# Patient Record
Sex: Female | Born: 1935 | Race: White | Hispanic: No | Smoking: Former smoker
Health system: Southern US, Community
[De-identification: ages and names within clinical notes are randomized; demographics above are authoritative.]

## PROBLEM LIST (undated history)

## (undated) DIAGNOSIS — J449 Chronic obstructive pulmonary disease, unspecified: Secondary | ICD-10-CM

## (undated) DIAGNOSIS — R221 Localized swelling, mass and lump, neck: Secondary | ICD-10-CM

## (undated) DIAGNOSIS — K254 Chronic or unspecified gastric ulcer with hemorrhage: Secondary | ICD-10-CM

## (undated) DIAGNOSIS — H269 Unspecified cataract: Secondary | ICD-10-CM

## (undated) DIAGNOSIS — K8020 Calculus of gallbladder without cholecystitis without obstruction: Secondary | ICD-10-CM

## (undated) DIAGNOSIS — E119 Type 2 diabetes mellitus without complications: Secondary | ICD-10-CM

## (undated) DIAGNOSIS — C44721 Squamous cell carcinoma of skin of unspecified lower limb, including hip: Secondary | ICD-10-CM

## (undated) DIAGNOSIS — R209 Unspecified disturbances of skin sensation: Secondary | ICD-10-CM

## (undated) DIAGNOSIS — S02640A Fracture of ramus of mandible, unspecified side, initial encounter for closed fracture: Secondary | ICD-10-CM

## (undated) DIAGNOSIS — K5090 Crohn's disease, unspecified, without complications: Secondary | ICD-10-CM

## (undated) DIAGNOSIS — E785 Hyperlipidemia, unspecified: Secondary | ICD-10-CM

## (undated) DIAGNOSIS — M5430 Sciatica, unspecified side: Secondary | ICD-10-CM

## (undated) DIAGNOSIS — K219 Gastro-esophageal reflux disease without esophagitis: Secondary | ICD-10-CM

## (undated) DIAGNOSIS — I872 Venous insufficiency (chronic) (peripheral): Secondary | ICD-10-CM

## (undated) DIAGNOSIS — R002 Palpitations: Secondary | ICD-10-CM

## (undated) DIAGNOSIS — G44209 Tension-type headache, unspecified, not intractable: Secondary | ICD-10-CM

## (undated) DIAGNOSIS — M1990 Unspecified osteoarthritis, unspecified site: Secondary | ICD-10-CM

## (undated) DIAGNOSIS — R0609 Other forms of dyspnea: Secondary | ICD-10-CM

## (undated) DIAGNOSIS — E041 Nontoxic single thyroid nodule: Secondary | ICD-10-CM

## (undated) DIAGNOSIS — G4700 Insomnia, unspecified: Secondary | ICD-10-CM

## (undated) DIAGNOSIS — N393 Stress incontinence (female) (male): Secondary | ICD-10-CM

## (undated) DIAGNOSIS — R5383 Other fatigue: Secondary | ICD-10-CM

## (undated) DIAGNOSIS — M25519 Pain in unspecified shoulder: Secondary | ICD-10-CM

## (undated) DIAGNOSIS — R609 Edema, unspecified: Secondary | ICD-10-CM

## (undated) DIAGNOSIS — R269 Unspecified abnormalities of gait and mobility: Secondary | ICD-10-CM

## (undated) DIAGNOSIS — D649 Anemia, unspecified: Secondary | ICD-10-CM

## (undated) DIAGNOSIS — J189 Pneumonia, unspecified organism: Secondary | ICD-10-CM

## (undated) DIAGNOSIS — M25569 Pain in unspecified knee: Secondary | ICD-10-CM

## (undated) DIAGNOSIS — I2510 Atherosclerotic heart disease of native coronary artery without angina pectoris: Secondary | ICD-10-CM

## (undated) DIAGNOSIS — L719 Rosacea, unspecified: Secondary | ICD-10-CM

## (undated) DIAGNOSIS — J479 Bronchiectasis, uncomplicated: Secondary | ICD-10-CM

## (undated) DIAGNOSIS — W010XXA Fall on same level from slipping, tripping and stumbling without subsequent striking against object, initial encounter: Secondary | ICD-10-CM

## (undated) DIAGNOSIS — J411 Mucopurulent chronic bronchitis: Secondary | ICD-10-CM

## (undated) DIAGNOSIS — I10 Essential (primary) hypertension: Secondary | ICD-10-CM

## (undated) DIAGNOSIS — R5381 Other malaise: Secondary | ICD-10-CM

## (undated) DIAGNOSIS — R000 Tachycardia, unspecified: Secondary | ICD-10-CM

## (undated) DIAGNOSIS — R0902 Hypoxemia: Secondary | ICD-10-CM

## (undated) HISTORY — DX: Squamous cell carcinoma of skin of unspecified lower limb, including hip: C44.721

## (undated) HISTORY — DX: Anemia, unspecified: D64.9

## (undated) HISTORY — DX: Unspecified cataract: H26.9

## (undated) HISTORY — DX: Edema, unspecified: R60.9

## (undated) HISTORY — DX: Type 2 diabetes mellitus without complications: E11.9

## (undated) HISTORY — DX: Pneumonia, unspecified organism: J18.9

## (undated) HISTORY — DX: Stress incontinence (female) (male): N39.3

## (undated) HISTORY — DX: Crohn's disease, unspecified, without complications: K50.90

## (undated) HISTORY — DX: Nontoxic single thyroid nodule: E04.1

## (undated) HISTORY — PX: SMALL INTESTINE SURGERY: SHX150

## (undated) HISTORY — DX: Rosacea, unspecified: L71.9

## (undated) HISTORY — DX: Gastro-esophageal reflux disease without esophagitis: K21.9

## (undated) HISTORY — DX: Chronic or unspecified gastric ulcer with hemorrhage: K25.4

## (undated) HISTORY — DX: Hyperlipidemia, unspecified: E78.5

## (undated) HISTORY — PX: JOINT REPLACEMENT: SHX530

## (undated) HISTORY — DX: Unspecified abnormalities of gait and mobility: R26.9

## (undated) HISTORY — DX: Other forms of dyspnea: R06.09

## (undated) HISTORY — DX: Insomnia, unspecified: G47.00

## (undated) HISTORY — DX: Pain in unspecified knee: M25.569

## (undated) HISTORY — DX: Tachycardia, unspecified: R00.0

## (undated) HISTORY — DX: Atherosclerotic heart disease of native coronary artery without angina pectoris: I25.10

## (undated) HISTORY — DX: Other fatigue: R53.83

## (undated) HISTORY — DX: Unspecified disturbances of skin sensation: R20.9

## (undated) HISTORY — DX: Hypoxemia: R09.02

## (undated) HISTORY — DX: Pain in unspecified shoulder: M25.519

## (undated) HISTORY — PX: TOTAL KNEE ARTHROPLASTY: SHX125

## (undated) HISTORY — DX: Unspecified osteoarthritis, unspecified site: M19.90

## (undated) HISTORY — DX: Calculus of gallbladder without cholecystitis without obstruction: K80.20

## (undated) HISTORY — DX: Bronchiectasis, uncomplicated: J47.9

## (undated) HISTORY — DX: Sciatica, unspecified side: M54.30

## (undated) HISTORY — DX: Tension-type headache, unspecified, not intractable: G44.209

## (undated) HISTORY — DX: Mucopurulent chronic bronchitis: J41.1

## (undated) HISTORY — DX: Localized swelling, mass and lump, neck: R22.1

## (undated) HISTORY — DX: Chronic obstructive pulmonary disease, unspecified: J44.9

## (undated) HISTORY — DX: Venous insufficiency (chronic) (peripheral): I87.2

## (undated) HISTORY — DX: Fracture of ramus of mandible, unspecified side, initial encounter for closed fracture: S02.640A

## (undated) HISTORY — DX: Other malaise: R53.81

## (undated) HISTORY — DX: Palpitations: R00.2

## (undated) HISTORY — DX: Fall on same level from slipping, tripping and stumbling without subsequent striking against object, initial encounter: W01.0XXA

## (undated) HISTORY — DX: Essential (primary) hypertension: I10

---

## 1988-02-04 DIAGNOSIS — H269 Unspecified cataract: Secondary | ICD-10-CM

## 1988-02-04 HISTORY — DX: Unspecified cataract: H26.9

## 1988-02-04 HISTORY — PX: EYE SURGERY: SHX253

## 1999-08-16 ENCOUNTER — Encounter: Admission: RE | Admit: 1999-08-16 | Discharge: 1999-08-16 | Payer: Self-pay | Admitting: Internal Medicine

## 1999-08-16 ENCOUNTER — Encounter: Payer: Self-pay | Admitting: Internal Medicine

## 2000-03-19 ENCOUNTER — Encounter: Admission: RE | Admit: 2000-03-19 | Discharge: 2000-06-17 | Payer: Self-pay | Admitting: Internal Medicine

## 2000-04-06 ENCOUNTER — Encounter (INDEPENDENT_AMBULATORY_CARE_PROVIDER_SITE_OTHER): Payer: Self-pay

## 2000-04-06 ENCOUNTER — Ambulatory Visit (HOSPITAL_COMMUNITY): Admission: RE | Admit: 2000-04-06 | Discharge: 2000-04-06 | Payer: Self-pay | Admitting: *Deleted

## 2000-08-17 ENCOUNTER — Encounter: Payer: Self-pay | Admitting: Internal Medicine

## 2000-08-17 ENCOUNTER — Encounter: Admission: RE | Admit: 2000-08-17 | Discharge: 2000-08-17 | Payer: Self-pay | Admitting: Internal Medicine

## 2001-08-11 ENCOUNTER — Encounter (HOSPITAL_BASED_OUTPATIENT_CLINIC_OR_DEPARTMENT_OTHER): Admission: RE | Admit: 2001-08-11 | Discharge: 2001-11-09 | Payer: Self-pay | Admitting: Internal Medicine

## 2001-09-07 ENCOUNTER — Encounter: Payer: Self-pay | Admitting: Internal Medicine

## 2001-09-07 ENCOUNTER — Encounter: Admission: RE | Admit: 2001-09-07 | Discharge: 2001-09-07 | Payer: Self-pay | Admitting: Internal Medicine

## 2002-02-18 ENCOUNTER — Encounter (HOSPITAL_BASED_OUTPATIENT_CLINIC_OR_DEPARTMENT_OTHER): Admission: RE | Admit: 2002-02-18 | Discharge: 2002-05-19 | Payer: Self-pay | Admitting: Internal Medicine

## 2002-05-20 ENCOUNTER — Encounter (HOSPITAL_BASED_OUTPATIENT_CLINIC_OR_DEPARTMENT_OTHER): Admission: RE | Admit: 2002-05-20 | Discharge: 2002-08-18 | Payer: Self-pay | Admitting: Internal Medicine

## 2002-09-12 ENCOUNTER — Encounter: Admission: RE | Admit: 2002-09-12 | Discharge: 2002-09-12 | Payer: Self-pay | Admitting: Internal Medicine

## 2002-09-12 ENCOUNTER — Encounter: Payer: Self-pay | Admitting: Internal Medicine

## 2003-03-10 ENCOUNTER — Ambulatory Visit (HOSPITAL_COMMUNITY): Admission: RE | Admit: 2003-03-10 | Discharge: 2003-03-10 | Payer: Self-pay | Admitting: Cardiology

## 2003-10-02 ENCOUNTER — Ambulatory Visit (HOSPITAL_COMMUNITY): Admission: RE | Admit: 2003-10-02 | Discharge: 2003-10-02 | Payer: Self-pay | Admitting: Internal Medicine

## 2003-10-12 ENCOUNTER — Ambulatory Visit (HOSPITAL_COMMUNITY): Admission: RE | Admit: 2003-10-12 | Discharge: 2003-10-12 | Payer: Self-pay | Admitting: Cardiology

## 2004-05-04 HISTORY — PX: GASTROJEJUNOSTOMY: SHX1697

## 2004-05-17 ENCOUNTER — Ambulatory Visit: Payer: Self-pay | Admitting: Internal Medicine

## 2004-05-17 ENCOUNTER — Inpatient Hospital Stay (HOSPITAL_COMMUNITY): Admission: EM | Admit: 2004-05-17 | Discharge: 2004-05-30 | Payer: Self-pay | Admitting: Emergency Medicine

## 2005-02-11 ENCOUNTER — Ambulatory Visit (HOSPITAL_COMMUNITY): Admission: RE | Admit: 2005-02-11 | Discharge: 2005-02-11 | Payer: Self-pay | Admitting: Internal Medicine

## 2006-02-03 DIAGNOSIS — K254 Chronic or unspecified gastric ulcer with hemorrhage: Secondary | ICD-10-CM

## 2006-02-03 HISTORY — PX: OTHER SURGICAL HISTORY: SHX169

## 2006-02-03 HISTORY — DX: Chronic or unspecified gastric ulcer with hemorrhage: K25.4

## 2006-04-13 ENCOUNTER — Ambulatory Visit (HOSPITAL_COMMUNITY): Admission: RE | Admit: 2006-04-13 | Discharge: 2006-04-13 | Payer: Self-pay | Admitting: Internal Medicine

## 2007-05-07 ENCOUNTER — Ambulatory Visit (HOSPITAL_COMMUNITY): Admission: RE | Admit: 2007-05-07 | Discharge: 2007-05-07 | Payer: Self-pay | Admitting: Internal Medicine

## 2008-02-04 DIAGNOSIS — M25569 Pain in unspecified knee: Secondary | ICD-10-CM

## 2008-02-04 HISTORY — DX: Pain in unspecified knee: M25.569

## 2008-06-06 ENCOUNTER — Ambulatory Visit (HOSPITAL_COMMUNITY): Admission: RE | Admit: 2008-06-06 | Discharge: 2008-06-06 | Payer: Self-pay | Admitting: Internal Medicine

## 2008-08-23 ENCOUNTER — Emergency Department (HOSPITAL_COMMUNITY): Admission: EM | Admit: 2008-08-23 | Discharge: 2008-08-23 | Payer: Self-pay | Admitting: Family Medicine

## 2008-09-13 ENCOUNTER — Encounter: Admission: RE | Admit: 2008-09-13 | Discharge: 2008-09-13 | Payer: Self-pay | Admitting: Emergency Medicine

## 2009-03-29 ENCOUNTER — Ambulatory Visit (HOSPITAL_COMMUNITY): Admission: RE | Admit: 2009-03-29 | Discharge: 2009-03-29 | Payer: Self-pay | Admitting: Orthopedic Surgery

## 2009-08-13 ENCOUNTER — Ambulatory Visit (HOSPITAL_COMMUNITY): Admission: RE | Admit: 2009-08-13 | Discharge: 2009-08-13 | Payer: Self-pay | Admitting: Internal Medicine

## 2009-10-16 ENCOUNTER — Encounter: Admission: RE | Admit: 2009-10-16 | Discharge: 2009-10-16 | Payer: Self-pay | Admitting: Internal Medicine

## 2009-10-17 ENCOUNTER — Encounter: Admission: RE | Admit: 2009-10-17 | Discharge: 2009-10-17 | Payer: Self-pay | Admitting: Internal Medicine

## 2010-02-24 ENCOUNTER — Encounter: Payer: Self-pay | Admitting: Internal Medicine

## 2010-06-14 ENCOUNTER — Encounter: Payer: Self-pay | Admitting: Family Medicine

## 2010-06-14 ENCOUNTER — Inpatient Hospital Stay (HOSPITAL_COMMUNITY)
Admission: AD | Admit: 2010-06-14 | Discharge: 2010-06-17 | DRG: 195 | Disposition: A | Discharge status: Nursing Home (Includes ICF) | Payer: Medicare Other | Source: Ambulatory Visit | Attending: Family Medicine | Admitting: Family Medicine

## 2010-06-14 DIAGNOSIS — I10 Essential (primary) hypertension: Secondary | ICD-10-CM | POA: Diagnosis present

## 2010-06-14 DIAGNOSIS — Z860100 Personal history of colon polyps, unspecified: Secondary | ICD-10-CM

## 2010-06-14 DIAGNOSIS — E785 Hyperlipidemia, unspecified: Secondary | ICD-10-CM | POA: Diagnosis present

## 2010-06-14 DIAGNOSIS — Z96659 Presence of unspecified artificial knee joint: Secondary | ICD-10-CM

## 2010-06-14 DIAGNOSIS — J159 Unspecified bacterial pneumonia: Secondary | ICD-10-CM

## 2010-06-14 DIAGNOSIS — E119 Type 2 diabetes mellitus without complications: Secondary | ICD-10-CM | POA: Diagnosis present

## 2010-06-14 DIAGNOSIS — G8929 Other chronic pain: Secondary | ICD-10-CM | POA: Diagnosis present

## 2010-06-14 DIAGNOSIS — J189 Pneumonia, unspecified organism: Principal | ICD-10-CM | POA: Diagnosis present

## 2010-06-14 DIAGNOSIS — M479 Spondylosis, unspecified: Secondary | ICD-10-CM | POA: Diagnosis present

## 2010-06-14 DIAGNOSIS — J40 Bronchitis, not specified as acute or chronic: Secondary | ICD-10-CM | POA: Diagnosis present

## 2010-06-14 DIAGNOSIS — K219 Gastro-esophageal reflux disease without esophagitis: Secondary | ICD-10-CM | POA: Diagnosis present

## 2010-06-14 DIAGNOSIS — Z9849 Cataract extraction status, unspecified eye: Secondary | ICD-10-CM

## 2010-06-14 LAB — CBC
HCT: 31.5 % — ABNORMAL LOW (ref 36.0–46.0)
Hemoglobin: 10.6 g/dL — ABNORMAL LOW (ref 12.0–15.0)
Platelets: 221 10*3/uL (ref 150–400)
RBC: 3.52 MIL/uL — ABNORMAL LOW (ref 3.87–5.11)

## 2010-06-14 LAB — GLUCOSE, CAPILLARY
Glucose-Capillary: 168 mg/dL — ABNORMAL HIGH (ref 70–99)
Glucose-Capillary: 177 mg/dL — ABNORMAL HIGH (ref 70–99)

## 2010-06-14 LAB — BASIC METABOLIC PANEL
BUN: 12 mg/dL (ref 6–23)
Chloride: 89 mEq/L — ABNORMAL LOW (ref 96–112)
GFR calc non Af Amer: >60 mL/min (ref >60)
Potassium: 3.9 mEq/L (ref 3.5–5.1)
Sodium: 128 mEq/L — ABNORMAL LOW (ref 135–145)

## 2010-06-16 LAB — CBC
Hemoglobin: 10.4 g/dL — ABNORMAL LOW (ref 12.0–15.0)
MCV: 88.7 fL (ref 78.0–100.0)
RBC: 3.45 MIL/uL — ABNORMAL LOW (ref 3.87–5.11)
WBC: 11.8 10*3/uL — ABNORMAL HIGH (ref 4.0–10.5)

## 2010-06-16 LAB — GLUCOSE, CAPILLARY: Glucose-Capillary: 173 mg/dL — ABNORMAL HIGH (ref 70–99)

## 2010-06-16 LAB — COMPREHENSIVE METABOLIC PANEL
ALT: 14 U/L (ref 0–35)
AST: 15 U/L (ref 0–37)
Albumin: 2.2 g/dL — ABNORMAL LOW (ref 3.5–5.2)
Alkaline Phosphatase: 95 U/L (ref 39–117)
BUN: 11 mg/dL (ref 6–23)
Calcium: 8.9 mg/dL (ref 8.4–10.5)
Creatinine, Ser: <0.47 mg/dL (ref 0.4–1.2)
Glucose, Bld: 146 mg/dL — ABNORMAL HIGH (ref 70–99)
Potassium: 3.7 mEq/L (ref 3.5–5.1)
Sodium: 131 mEq/L — ABNORMAL LOW (ref 135–145)
Total Protein: 6.2 g/dL (ref 6.0–8.3)

## 2010-06-17 DIAGNOSIS — J159 Unspecified bacterial pneumonia: Secondary | ICD-10-CM

## 2010-06-21 NOTE — Op Note (Signed)
NAMENOVELLE, ADDAIR               ACCOUNT NO.:  [id number]   MEDICAL RECORD NO.:  [id number]          PATIENT TYPE:  INP   LOCATION:  0155                         FACILITY:  [facility]   PHYSICIAN:  Vikki Ports, MDDATE OF BIRTH:  May 25, 1917   DATE OF PROCEDURE:  05/20/2004  DATE OF DISCHARGE:                                 OPERATIVE REPORT   PREOPERATIVE DIAGNOSIS:  Duodenal ulcer.   POSTOPERATIVE DIAGNOSIS:  Giant duodenal ulcer with active bleeding from the  gastroduodenal artery.   PROCEDURE:  Exploratory laparotomy, over-sewing of the duodenal stump,  pyloric excursion, and gastrojejunostomy.   SURGEON:  Vikki Ports, MD   ASSISTANT:  Thornton Park. Daphine Deutscher, M.D., Sandria Bales. Ezzard Standing, M.D.   DESCRIPTION OF PROCEDURE:  The patient was taken to the operating room and  placed in supine position after adequate general anesthesia was induced  using an endotracheal tube.  The abdomen was prepped and draped in a normal  sterile fashion.  Using an upper vertical midline incision, I dissected down  the fascia, and the fascia was opened.  The peritoneum was entered.  A very  large area of scar tissue was noticed on the second portion of the duodenum,  extending from the first portion to the second with adhesions to the  gallbladder.  The gallbladder had walled off an obvious perforation.  There  were no biliary contents and no evidence of free perforation.  The proximal  portion of the first portion of the duodenum was very thick and scarred, and  there was minimal serosa to sew to, and I opted to exclude the pylorus with  a TA 60 across the pylorus.  The duodenal stump proximal to the ampulla was  over-sewn with interrupted 2-0 silk sutures, and Tisseel was placed over the  repair.  Of note, there was a large vessel in the posterior wall of the  ulcer with a clot on it.  On manipulating this, we had active arterial  bleeding, which was controlled with 2-0 silk sutures.  The NG  tube was found  to be in good position, and a side-to-side gastrojejunostomy was performed  using the jejunum approximately 100 cm distal to the ligament of Treitz.  This was accomplished using a 75 mm blue-load GIA and the TA 60.  Comfortable with the duodenal stump repair,  the pyloric exclusion, and the gastrojejunostomy, I irrigated the abdomen  and opted to close.  Because of the patient's age, I opted not to do a  vagotomy.  The fascia was closed with running #1 Novofil.  The skin was  closed with staples.  The patient tolerated the procedure well and went to  PACU in good condition.      KRH/MEDQ  D:  05/20/2004  T:  05/20/2004  Job:  [id number]   cc:   Georgiana Spinner, M.D.  [address]  [place]  [place] [address]  Fax: [phone]

## 2010-06-21 NOTE — Cardiovascular Report (Signed)
NAME:  [name], [name]                         ACCOUNT NO.:  [id number]   MEDICAL RECORD NO.:  [id number]                   PATIENT TYPE:  OIB   LOCATION:  2854                                 FACILITY:  [facility]   PHYSICIAN:  Cristy Hilts. Jacinto Halim, M.D.                  DATE OF BIRTH:  24-Nov-1917   DATE OF PROCEDURE:  10/12/2003  DATE OF DISCHARGE:  10/12/2003                              CARDIAC CATHETERIZATION   PROCEDURE PERFORMED:  1.  Abdominal aortogram.  2.  Selective right and left renal arteriography.   INDICATION:  [name] is an 77 year old active female with history  of difficult to control hypertension in spite of her being on four  antihypertensive medications.  Given this, she was brought to the  catheterization lab to evaluate for renal artery stenosis and etiology for  uncontrolled hypertension.   HEMODYNAMIC DATA:  The opening aortic pressure of 162/63 with mean of 102  mmHg.   ANGIOGRAPHIC DATA:   ABDOMINAL AORTOGRAM:  Abdominal aortogram revealed presence of two renal  arteries; one on either side.  They appeared to be patent.   SELECTIVE RIGHT AND LEFT RENAL ARTERIOGRAPHY:  Selective right and left  arteriography revealed widely patent renal arteries.  The right renal artery  arises posteriorly and has no significant luminal irregularity.  It is  smooth and normal.   Left renal artery is also widely patent.   IMPRESSION:  Normal renal arteries with no evidence of renal artery stenosis  or atherosclerotic changes of the renal arteries.   RECOMMENDATIONS:  Based on the angiogram, will continue therapy for  essential hypertension is indicated.  Will add Catapres 0.1 mg p.o. b.i.d.  for the present regimen.  The patient will follow up as an outpatient.   TECHNIQUE OF PROCEDURE:  Under usual sterile precautions, using a 5 French  right femoral arterial access, a 5 French short Judkins right four  diagnostic catheter was utilized to perform the abdominal  aortogram.  Then,  the same catheter was utilized to engage the right and then the left renal  artery in a selective manner and then angiography was repeated.  Then, the  catheter was pulled out of the body in the usual fashion.  The patient  tolerated the procedure well.  No immediate complications noted.                                               Cristy Hilts. Jacinto Halim, M.D.    Pilar Plate  D:  10/12/2003  T:  10/13/2003  Job:  [id number]

## 2010-06-21 NOTE — Consult Note (Signed)
[facility]  Patient:    [name], [name] Visit Number: [id number] MRN: [id number]          Service Type: FTC Location: FOOT Attending Physician:  Sharren Bridge Dictated by:   Jonelle Sports Cheryll Cockayne, M.D. Proc. Date: 08/12/01 Admit Date:  08/11/2001   CC:         Truitt Merle, M.D.   Consultation Report  HISTORY:  This 77 year old white female comes referred by Dr. Farrel Conners for assistance with management of interdigital callus or corn on the left foot.  The patient apparently has been troubled by this lesion which is in the web of the interspace between the fourth and fifth toe of a left foot for a number of years.  There is striking bony protuberance at the proximal interphalangeal joint of the fifth toe and suspicion that there may have been prior trauma and partial dislocation of that toe to generate this.  She is unaware of any specific such trauma.  Nonetheless, with the compression of her forefoot by her shoes, this bony prominence is driven into the base of the web space and has generated chronic callus formation there.  Apparently from time to time this has been reduced by the podiatrist and she has been given a spacer to use between the fourth and fifth toe.  Nonetheless, it does recur and it has recurred recently and is currently uncomfortable to her.  When she mentioned this to Dr. Farrel Conners, Dr. Farrel Conners referred her here for our assistance.  PAST MEDICAL HISTORY:  Notable for type 2 diabetes, hypertension, hyperlipidemia, degenerative arthritis, venous insufficiency, diverticulosis, gastric ulcer disease, GERD.  She had had bilateral knee replacements.  ALLERGIES:  ADHESIVE TAPE, CODEINE.  REGULAR MEDICATIONS:  Glucophage XL, indapamide, Lipitor, aspirin, temazepam, Lotensin, potassium, Ocuvite, and several other OTC medications.  PHYSICAL EXAMINATION  EXTREMITIES:  Examination today is limited to the distal lower  extremities. The patients feet are without gross deformity, although there is the somewhat peculiar configuration of the left fifth toe as previously described.  There is no significant edema.  There is no evidence of severe chronic venous insufficiency, although she does have a history of mild problems in that regard.  Her skin temperatures are essentially normal and symmetrical.  All pulses are palpable and adequate.  Monofilament testing shows the preservation of protective sensation throughout both feet.  She has minor callus formation over the interphalangeal joint of the right hallux at the plantar first metatarsal head area.  In the interspace between the fourth and fifth toes of the left foot right at the base or web aspect of that interspace is a hardened calloused area without evidence of ulceration or secondary infection.  DISPOSITION: 1. The patient is given limited instruction regarding foot care and diabetes    by video instruction with nurse reinforcement. 2. The interdigital callus on the left foot is sharply pared without incident. 3. The calluses on the plantar aspect of the right foot are dremeled without    incident. 4. The patient is advised to continue use of her toe spacer and to insist on    adequate forefoot width in her foot wear. 5. She will return here on a p.r.n. basis. Dictated by:   Jonelle Sports Cheryll Cockayne, M.D. Attending Physician:  Sharren Bridge DD:  08/12/01 TD:  08/15/01 Job: [id number]

## 2010-06-21 NOTE — Discharge Summary (Signed)
[name], [name]               ACCOUNT NO.:  [id number]   MEDICAL RECORD NO.:  [id number]          PATIENT TYPE:  INP   LOCATION:  0376                         [facility]AC[facility]   PHYSICIAN:  Maxwell Caul, M.D.DATE OF BIRTH:  [birth year]   DATE OF ADMISSION:  05/17/2004  DATE OF DISCHARGE:                                 DISCHARGE SUMMARY   BRIEF HISTORY:  This is an 77 year old woman from [facility] in the  independent living section under the primary care of Dr. Chilton Si. She had been  seen recently with epigastric and back pain. She had been seen recently in  the clinic at [facility] with these problems. A urine culture on  May 09, 2004 had shown E. coli that was treated. She also had medication  alterations including discontinuing her Voltaren and Skelaxin.   Just before admission she was transported to the skilled unit of [facility]. She developed melanotic stools two times yesterday and three  times on the day of admission. On arrival to the emergency room, her  hemoglobin was 8.9; it was 11.5 the day before admission. She had an  endo/colon by Dr. Virginia Rochester 3 years ago which showed a gastric ulcer and  esophagitis. She had not been on a proton pump inhibitor but had recently  been on Voltaren and enteric-coated aspirin.   PAST MEDICAL HISTORY:  1.  Back pain with an MRI on March 20 showing multilevel spondylosis.  2.  Type 2 diabetes mellitus on oral agents.  3.  Hemorrhoids external and internal.  4.  Hypertension.  5.  Osteoarthritis.  6.  Venous insufficiency  7.  Hyperlipidemia.  8.  Gastroesophageal reflux disease.  9.  Colonoscopy by Dr. Virginia Rochester showing multiple polyps.  10. Arthralgias.   PAST SURGICAL HISTORY:  She had total knee replacement in 1993 and then on  the other side in 1998.   PAST PROCEDURES:  She had a Cardiolite in 2004 which showed inferior wall  ischemia, colonoscopy had polyp removal in the late 1990s. Had another  colonoscopy and endoscopy in 2002 which showed colonic polyps and endoscopy  showed ulcerations throughout the stomach, mostly in the antrum but some in  the body and the fundus. Esophagitis was also noted. At that time, pathology  showed erosions but no evidence of metaplasia or Helicobacter pylori were  identified. Esophageal biopsy changes were consistent with reflux.   MEDICATIONS ON ADMISSION:  1.  Glucophage XR 1000 b.i.d.  2.  Coreg 2.5 b.i.d.  3.  Lozol 1.25 daily.  4.  Aspirin 81 daily.  5.  Teveten 600 daily.  6.  Caduet 5/10 one p.o. daily.  7.  Tylenol Arthritis two p.o. q.8h.  8.  Tums two p.o. b.i.d.  9.  Clonidine 0.25 b.i.d.  10. She had been on Flagyl 500 q.8h. x10 days that started on April 13.  11. Was started on Cipro 250 p.o. b.i.d. for 10 days on the day of admission      to hospital.   REVIEW OF SYSTEMS:  CARDIOVASCULAR:  She denied any recent chest  pain or  exertional shortness of breath. GU:  She denied any dysuria or hematuria.   PHYSICAL EXAMINATION ON ADMISSION:  VITAL SIGNS:  Her blood pressure was in  the mid 90s systolic on arrival but had come up to 111 systolic by time I  had reviewed her with fluids and blood.  GENERAL:  She was pale but alert and talkative.  LYMPH:  None palpable.  RESPIRATORY:  Clear.  CARDIOVASCULAR:  Heart sounds were normal. There were no murmurs or gallops.  ABDOMEN:  Soft, nontender. No liver or spleen was noted.  RECTAL:  OB positive by the EDP who had seen her prior to our arrival.  BREAST:  Deferred.  EXTREMITIES:  Showed normal pulses without edema.   RADIOLOGY:  Chest x-ray showed no active lung disease.   LABORATORY AND OTHER INVESTIGATIONS:  Urine culture showed no growth. On  arrival, her sodium was 123, potassium 3.3, chloride 88, CO2 was 27, BUN 61,  creatinine 1.1, albumin 2.4. By April 15, her sodium was up to 127. By April  19, it had corrected to 138. Her BUN at that point was 13 and creatinine was  1.7,  albumin was 1.8. On April 25, her sodium was 135, potassium 3.3,  chloride 103, CO2 27, BUN was 3, and creatinine was 0.6. Her liver function  tests were consistently normal.   On arrival, her white count was 19.1, hemoglobin was 8.9. She was transfused  to a hemoglobin of 11.7. However, over the next 48 hours her hemoglobin  dropped to 8.2 with further obvious clinical bleeding. She was transfused  again. However, by April 19 her hemoglobin had dropped to 7.6. On April 25,  at discharge, her hemoglobin is 9.7, white count 7.2.   COURSE IN HOSPITAL:  This patient was admitted with findings consistent with  upper gastrointestinal bleeding source including black, tarry stools,  elevated BUN, and together with clinical history. She was kindly seen in  consultation by Dr. Juanda Chance for Dr. Virginia Rochester and underwent endoscopy. The  endoscopy showed a mild stricture in the distal esophagus. However, in the  duodenal bulb there was a large ulcer with clot black material. The ulcer  was washed. There was no active bleeding but there was stigmata of recent  bleeding. She was started on proton pump inhibitors intravenously.   Unfortunately, the patient did not stabilize. She required repeat  transfusions and required repeat endoscopy on April 17 by Dr. Virginia Rochester. Noted a  huge duodenal ulcer with large visible vessels. At that point in time,  general surgery was consulted who was Dr. Luan Pulling and her colleagues. The  patient ultimately went to the operating room on April 17 by Dr. Luan Pulling  and colleagues. She had the duodenal ulcer oversewn. The duodenal stump  proximal to the ampulla was oversewn. A large vessel in the posterior wall  of the ulcer was noted and with manipulation, this developed active bleeding  which was sutured. Ultimately, she had a side-to-side gastrojejunostomy  using the jejunum approximately 100 cm distal to the ligament of Treitz. A  vagotomy was not done.  The patient was then  transported to the ICU. She spent several days being  monitored in the intensive care unit but generally her progress was slow and  steady with improvement. She was eventually able to be sent to the floor and  she continued to stabilize. She had drains in that were eventually able to  be removed and had a protracted period of time with  an NG tube, and this was  also eventually able to be removed. Her hemoglobin stabilized in the mid to  upper 9 range.   Roughly 72 hours prior to her discharge she developed a rash in her lower  extremities. This had a vasculitic tone to it. She was kindly seen in  consultation by Dr. Earlene Plater of dermatology. He felt that this was most likely  leukocytoclastic vasculitis of uncertain etiology. He noted a biopsy could  be performed but unlikely to reveal the etiology. Topical steroids for  symptomatic relief was noted. At discharge she will be receiving topical  triamcinolone and if this does not fade, to see Dr. Earlene Plater in roughly 1-2  weeks or sooner if it worsens.   FINAL DIAGNOSES:  1.  Upper gastrointestinal bleed secondary to huge bleeding duodenal ulcer;      see discussion above.  2.  Hypovolemic shock secondary to #1.  3.  Multiple transfusions required secondary to #1; eventually requiring      duodenal oversew and gastrojejunostomy performed by Dr. Luan Pulling (see      discussion above).  4.  Type 2 diabetes mellitus. For most of this hospitalization she did not      require anything but sliding scale. I will start her back on a small      dose of Glucophage at discharge and monitor her CBGs b.i.d. on return to      [facility].  5.  Hypertension. Also, she has been off most of her previous medications      during the course of this hospitalization and postoperatively. At      discharge, she will have Coreg and her angiotensin receptor blocker      added back. It is possible as her diet and oral intake improves that she      will need  additional medication adjustments.  6.  Leukocytoclastic vasculitis. See discussion above. She feels better with      topical steroids. This will continue and perhaps if this does not      resolve in the same mysterious way that it arrived then she will need to      see Dr. Earlene Plater in follow-up in his office.  7.  Osteoarthritis. The patient has continued to complain of osteoarthritic      pain in her hips and also her back. I am hopeful that we can manage this      with Tylenol Extra Strength and p.r.n. painkillers. She should, of      course, never be on nonsteroidals or aspirin.   DISCHARGE MEDICATIONS:  1.  Coreg 6.25 b.i.d.  2.  Protonix 40 mg p.o. daily.  3.  Glucophage 500 p.o. b.i.d.  4.  Tylenol Extra Strength two p.o. b.i.d. routinely.  5.  Ultram 50 mg p.o. q.6h. p.r.n.  6.  Triamcinolone 0.1% topically b.i.d. lightly to rash on lower extremities      x1 week.  7.  Teveten 600 mg p.o. daily.  OTHER DISCHARGE INSTRUCTIONS:  CBGs to be done b.i.d. It is not anticipated  at this point that she will need a sliding scale, although this may need to  be reinstituted. Blood pressure will need to be checked daily and as her  intake improves, additional blood pressure medications may need to be added  or current ones adjusted. She will need a CBC and basic metabolic panel on  Monday of next week - May 1. She will need follow-up with general surgery in  2 weeks' time in their office. If her rash is not resolved on her lower  extremities, she should follow up with Dr. Earlene Plater in 2 weeks' time. She can  either follow up with Dr. Chilton Si at the [facility] clinic or she can  follow up in the [facility] office and should be seen within 2 or  3 weeks' time. Her activity levels can  be p.r.n. Her diet should be regular, although it is anticipated that as  this improves that she may need to be changed to a no concentrated sweets or  ADA diet.   Overall, she is discharged in very  stable condition to the skilled unit of  [facility].      MGR/MEDQ  D:  05/30/2004  T:  05/30/2004  Job:  [id number]

## 2010-06-21 NOTE — Op Note (Signed)
[name], [name]               ACCOUNT NO.:  [id number]   MEDICAL RECORD NO.:  [id number]          PATIENT TYPE:  INP   LOCATION:  0155                         [facility]ACILITY[facility]   PHYSICIAN:  Georgiana Spinner, M.D.    DATE OF BIRTH:  30-Dec-1917   DATE OF PROCEDURE:  DATE OF DISCHARGE:                                 OPERATIVE REPORT   PROCEDURE:  Upper endoscopy.   ENDOSCOPIST:  Georgiana Spinner, M.D.   INDICATIONS:  Acute gastrointestinal bleeding.   ANESTHESIA:  Demerol 20, Versed 4 mg.   DESCRIPTION OF PROCEDURE:  With the patient mildly sedated in the left  lateral decubitus position, the Olympus videoscopic endoscope was inserted  in the mouth, passed under direct vision through the esophagus, which  appeared normal, into the stomach.  Fundus, body, and antrum was noted, and  all appeared normal.  There was a small amount of blood seen in the stomach,  dark blackish.  We entered into the duodenal bulb and just distal to the  bulb was a huge ulcer crater with blood clot on it.  We washed the clot off  and saw a large visible vessel that was not actively bleeding at this time.  I washed it on two occasions and elected that the best course of action  would be to do nothing at this point but to consider surgical treatment.  The endoscope was then pulled back into the stomach, placed in retroflexion  to view the stomach from below. The endoscope was straightened and withdrawn  taking circumferential views of the remaining gastric and esophageal mucosa.  The patient's vital signs and pulse oximeter remained stable.  The patient  tolerated the procedure well and without apparent complications.   FINDINGS:  Huge duodenal ulcer with large visible vessel.   PLAN:  The patient has bled twice now, and given the size of the vessel and  the ulcer, I think the best course of therapy is going to be surgical, and I  will contact surgery for a consultation.      GMO/MEDQ  D:  05/20/2004   T:  05/20/2004  Job:  [id number]   cc:   Maxwell Caul, M.D.  [address]  [place]  [place] [address]  Fax: [phone]

## 2010-06-21 NOTE — H&P (Signed)
[name], [name] NO.:  [id number]   MEDICAL RECORD NO.:  [id number]          PATIENT TYPE:  EMS   LOCATION:  ED                           FACILITY:  [facility]   PHYSICIAN:  Maxwell Caul, M.D.DATE OF BIRTH:  05-27-1917   DATE OF ADMISSION:  05/17/2004  DATE OF DISCHARGE:                                HISTORY & PHYSICAL   ID:  This is an 77 year old female from [facility]  under the primary care of Dr. Chilton [name].   CHIEF COMPLAINT:  Blood per rectum.   HISTORY:  This patient was seen in [facility] clinic on April 11 with  epigastric pain which had started on April 8. Her exam suggested tenderness  in the epigastric area and there was some suggestion of vomiting as well as  a foul odor to her urine noted. The Voltaren and Skelaxin that she was on  for back pain were discontinued. She had also been seen on March 21 with  diarrhea and blood when she wipes.  Noted to have heme positive stools at  that time. Urine culture showed E. coli on April 6 that was pan sensitive.   The patient tells me she vomited all day Tuesday without hematemesis and  then yesterday developed melanotic stools x2 and then x3 today. Her  hemoglobin was 8.9 on arrival in the emergency room, this was 11.5  yesterday. She has a history of having an endocolon by Dr. Virginia [name] approximately  three years ago which showed a gastric ulcer and esophagitis. She has not  been on a proton pump inhibitor but has recently been on Voltaren and  enteric coated aspirin.   PAST MEDICAL HISTORY:  1. Back pain with an MRI on March 20 showing multilevel spondylosis.  2. Type 2 diabetes mellitus on oral agents.  3. Hemorrhoids external and internal.  4. Hypertension.  5. Osteoarthritis.  6. Venous insufficiency.  7. Hyperlipidemia.  8. Gastroesophageal reflux disease.  9. Colonoscopy showing multiple polyps.  10.Arthralgia.     PAST SURGICAL HISTORY:  She had a bilateral  total knee replacement in 1993  and then in 1998.   PROCEDURE:  She had a Cardiolite in 2004 which showed inferior wall  ischemia, colonoscopy has shown polyp removal in the late 1990's.  Colonoscopy and endo by [name] in 2002 showed colonic polyps and an endo showed  ulcerations throughout the stomach mostly in the antrum but some in the body  and fundus. Esophagitis was also noted. At that time, pathology showed  erosions, no intestinal metaplasia or H. pylori were identified. Esophageal  biopsy change is consistent with reflux.   MEDICATIONS ON ADMISSION:  1. Glucophage XR 1000 b.i.d.  2. Coreg 2.5 b.i.d.  3. Lozol 1.25 q.d.  4. ASA 81 q.d.  5. Teveten 600 q.d.  6. Caduet 5/10, 1 p.o. q.d.  7. Tylenol arthritis 2 tabs q. 8h.  8. Tums 2 p.o. b.i.d.  9. Clonidine 0.25 b.i.d.  10.She has been on Flagyl 500 q.8 x10 days started on April 13 and was      started  Cipro 250 p.o. b.i.d. for 10 days today.     REVIEW OF SYMPTOMS:  CARDIOVASCULAR:  She denies chest pain. RESPIRATORY:  No shortness of breath. GU:  She states no current dysuria.   PHYSICAL EXAMINATION:  VITAL SIGNS:  Her blood pressure was in the 90's on  arrival now up to 111 systolic.  GENERAL:  She is a pale woman but alert and talkative.  LYMPHS:  None palpable.  RESPIRATORY:  Clear.  CARDIOVASCULAR:  Heart tones were normal. There are no murmurs, no gallops.  ABDOMEN:  Soft, nontender, no liver, no spleen.  RECTAL:  OB positive per the EDP.  BREASTS:  Deferred.  EXTREMITIES:  Showed normal pulses without edema.   LABORATORY DATA:  Laboratory work to date.  Her white count is 19.1 and was  20,000 yesterday. Her hemoglobin was 8.9 and was 11.5 yesterday. Platelet  count is normal at 379. Her PT and PTT are normal. Her comprehensive  metabolic panel shows a sodium of 123, potassium of 3.3, chloride 88, total  CO2 of 27, BUN of 61, creatinine 1.1, albumin is 2.4. Her liver function  tests are normal.    IMPRESSION:  1. Upper gastrointestinal bleed. I think this is likely NSAID induced      gastropathy. The patient has a history of peptic ulcer disease as it      was H. pylori negative as well as esophagitis. Her blood pressure was      down on arrival; however, she has responded now to transfusion and      fluids. I think she is stable enough to go to the floor. She will be      started on Protonix IV.  2. History of gastroesophageal reflux disease with esophagitis.  3. History of peptic ulcer disease in 2002.  4. Elevated white count. She has a urinary tract infection which was E.      coli and pan sensitive but no obvious clinical signs or symptoms. She      has been on antibiotics, however, and I will continue IV antibiotics      for presumed pyelonephritis. This also could demargination secondary to      stress as well. Will also check a chest x-ray.  5. Hyponatremia, cause is not completely certain; however, this is      occuring in the setting of volume deficiency and I will replace with      isotonic fluid.  6. Elevated BUN likely secondary to upper gastrointestinal bleeding      source.  7. Type 2 diabetes mellitus. She took her medications this morning and      therefore we will need to add D5 to her IV fluids.     PLAN:  She will receive 2 units of blood transfusion and her H&H will be  monitored overnight. She will need volume expansion. Proton pump inhibitor  IV was then started. Empiric Cipro for the possibility of continued UTI.  GI  consultant is going to be called.      MGR/MEDQ  D:  05/17/2004  T:  05/17/2004  Job:  [id number]

## 2010-06-21 NOTE — Procedures (Signed)
[facility]  Patient:    [name], [name]                        MRN: [id number] Proc. Date: 04/06/00 Adm. Date:  [id number] Attending:  Sabino Gasser                           Procedure Report  PROCEDURE:  Colonoscopy.  INDICATIONS:  Colon polyps.  ANESTHESIA:  Demerol 10 mg, Versed 2 mg.  DESCRIPTION OF PROCEDURE:  With the patient mildly sedated in the left lateral decubitus position, the Olympus videoscopic colonoscope was inserted into the rectum and passed under direct vision to the cecum, identified the ileocecal valve and appendiceal orifice, both of which were photographed.  From this point the colonoscope was slowly withdrawn, taking circumferential views of the entire colonic mucosa stopping to photograph diverticula seen in the sigmoid colon until we reached the rectum which appeared normal. Direct view of the rectum showed internal hemorrhoids on retroflexed view.  The endoscope was straightened and withdrawn.  The patients vital signs and pulse oximeter readings were stable.  The patient tolerated the procedure well without apparent complications.  FINDINGS: 1. Diverticulosis of the sigmoid colon. 2. Internal hemorrhoids.  PLAN:  Possible follow-up in about 5 years or as needed. DD:  04/06/00 TD:  04/07/00 Job: [id number]

## 2010-06-21 NOTE — Consult Note (Signed)
[name], [name]               ACCOUNT NO.:  [id number]   MEDICAL RECORD NO.:  [id number]          PATIENT TYPE:  INP   LOCATION:  0155                         [facility]AC[facility]  [facility]   PHYSICIAN:  Vikki Ports, MDDATE OF BIRTH:  08/15/1917   DATE OF CONSULTATION:  05/20/2004  DATE OF DISCHARGE:                                   CONSULTATION   REFERRED BY:  Georgiana Spinner, M.D.   REASON FOR CONSULTATION:  Duodenal ulcer.   HISTORY OF PRESENT ILLNESS:  The patient is an 77 year old female who  presented three days ago to the emergency room with blood per rectum. She  has been worked up in the hospital and has had two upper endoscopies the  most recent one this morning showing a very large duodenal ulcer with a  visible vessel not actively bleeding. The patient has received six units of  packed red blood cells. The most recent hemoglobin was 8.2 this morning.   PAST MEDICAL HISTORY:  Significant for type 2 diabetes mellitus, history of  hemorrhoids, hyperlipidemia, gastroesophageal reflux disease.   PAST SURGICAL HISTORY:  Significant for bilateral knee replacement.   MEDICATIONS:  Glucophage, Coreg, Lozol, aspirin, and p.r.n. over the counter  medications.   PHYSICAL EXAMINATION:  GENERAL:  She is an age appropriate white female in  the intensive care unit awake and alert. She did vomit blood last night.  There is no evidence of bloody emesis at this time.  HEENT:  Benign.  Normocephalic, atraumatic. She has got cataract changes.  LUNGS:  Clear to auscultation and percussion x2.  HEART:  Regular rate and rhythm without murmurs, rubs or gallops.  ABDOMEN:  Soft, completely nontender with no hepatosplenomegaly.  EXTREMITIES:  Show no cyanosis, clubbing or edema.   IMPRESSION:  Upper GI hemorrhage with a large duodenal ulcer failing medial  therapy with repeat episodes of bleeding.   PLAN:  Laparotomy with probably duodenotomy, oversewing of the ulcer and  possible vagotomy  and pyloroplasty.      KRH/MEDQ  D:  05/20/2004  T:  05/20/2004  Job:  [id number]

## 2010-06-21 NOTE — Procedures (Signed)
[facility]  Patient:    [name], [name]                        MRN: [id number] Proc. Date: 04/06/00 Adm. Date:  [id number] Attending:  Sabino Gasser                           Procedure Report  PROCEDURE:  Upper endoscopy.  INDICATIONS:  Abdominal pain and reflux symptomatology.  ANESTHESIA:  Demerol 50 mg, Versed 5 mg.  DESCRIPTION OF PROCEDURE:  With the patient mildly sedated in the left lateral decubitus position, the Olympus videoscopic endoscope was inserted in the mouth and passed under direct vision through the esophagus into the stomach. The fundus, body and antrum were viewed. In the antrum he had multiple ulcerations. They were photographed and biopsied. We entered into the duodenal bulb and second portion of the duodenum both of which appeared normal. From this point the endoscope was slowly withdrawn taking several circumferential views of the entire duodenal mucosa until the endoscope was then pulled back into the stomach and placed in retroflexion to view the stomach from below. The endoscope was then straightened and withdrawn taking circumferential views of the entire gastric mucosa, stopping to photograph an ulcer with underlying edema. This was biopsied as well. The endoscope was withdrawn to the esophagus which was photographed and biopsied. The endoscope was withdrawn. The patients vital signs and pulse oximetry remained stable.  The patient tolerated the procedure well without apparent complications.  FINDINGS: 1. Ulcerations found throughout the stomach, mostly in the antrum but some in    the body and in the fundus that was photographed and biopsied. 2. Esophagitis, biopsied.  PLAN:  Will await biopsy report. The patient will call me for results and follow-up with me as an outpatient. Proceed to colonoscopy. DD:  04/06/00 TD:  04/07/00 Job: [id number]

## 2010-07-05 NOTE — H&P (Signed)
[name], [name]               ACCOUNT NO.:  [id number]  MEDICAL RECORD NO.:  [id number]           PATIENT TYPE:  I  LOCATION:  6731                         FACILITY:  [facility]  PHYSICIAN:  Pearlean Brownie, M.D.DATE OF BIRTH:  November 22, 1917  DATE OF ADMISSION:  06/14/2010 DATE OF DISCHARGE:                             HISTORY & PHYSICAL   PRIMARY CARE PHYSICIAN:  Dr. Chilton Si at [facility].  CHIEF COMPLAINT:  Cough, shortness of breath.  The patient is a 77 year old female who was admitted for shortness of breath and cough, directly admitted from [facility].  The patient reports fever on Wednesday and cough.  The patient was seen at urgent care on [address] on Wednesday, was diagnosed with pneumonia, given antibiotic injection and by mouth antibiotic.  The patient was told to follow up on Friday which is today.  On recheck, primary care physician felt that the patient not significantly improved, so decided to admit to inpatient service for treatment and close monitoring.  REVIEW OF SYSTEMS:  No fever since Wednesday, positive cough and is productive.  No hemoptysis.  No urinary problems.  No diarrhea.  No nausea, vomiting.  No syncope, no blurry vision.  Positive weakness and positive decreased appetite.  MEDICATIONS: 1. Metformin 1000 mg p.o. b.i.d. 2. Carvedilol unsure of dosage b.i.d. 3. Amlodipine unsure of dosage. 4. Simvastatin dosage unknown. 5. Multivitamin.  ALLERGIES:  CODEINE causes dizziness and nausea.  PAST MEDICAL AND SURGICAL HISTORY: 1. Hypertension. 2. Hyperlipidemia. 3. Diabetes type 2. 4. Knee replacement x2. 5. Ulcer surgery of stomach. 6. Cataract surgery. 7. Spondylolysis and back pain, chronic. 8. Venous insufficiency. 9. Colon polyps.  FAMILY HISTORY:  Mother had arthritis, hypertension.  Father, stomach ulcers and pacemaker.  Siblings; sister colon cancer, brother diabetes and breast cancer.  SOCIAL HISTORY:  The patient  lives at [facility].  No smoking, stopped at age 70.  EtOH occasional.  Drugs denies.  PHYSICAL EXAMINATION:  VITAL SIGNS:  Temperature 98.9, heart rate 83, blood pressure 152/86, respiratory rate 24, pulse ox 98%. GENERAL:  No acute distress.  Some increase in work of breathing. RESPIRATORY:  Increased work of breathing with conversation, wheezing bilaterally on exam. CARDIOVASCULAR:  Regular rate and rhythm.  No murmurs, rubs, or gallops. ABDOMEN:  Soft, nontender, nondistended. EXTREMITIES:  No edema. SKIN:  No rash. NEUROLOGIC:  Alert and oriented x3, moving all 4 extremities.  LABS AND STUDIES:  CBC and BMET pending.  Chest x-ray at [facility] showed left-sided infiltrate.  ASSESSMENT AND PLAN:  The patient is a 77 year old female admitted for possible pneumonia in the setting of shortness of breath and cough. 1. Shortness of breath and cough, most likely pneumonia, questionable     left infiltrate on chest x-ray at urgent care versus bronchitis.Per [facility] provider, the patient failed outpatient     treatment and it is the reason she was being sent for inpatient     treatment.  We will treat with Avelox daily.  We will give DuoNeb     treatment q.4 h. as needed.  We will monitor pulse  ox.  We will     placed 2 L O2 for comfort. 2. We will restart home medications with med rec complete, unsure of     dosages of home medications.  We will monitor blood pressure     closely. 3. Reflux, history of ulcers.  Protonix 40 mg p.o. daily. 4. Diabetes.  We will monitor CBGs.  We will hold metformin.     Creatinine level pending. 5. Fluids, electrolytes, nutrition/gastrointestinal.  Carb-modified     diet. 6. Prophylaxis.  Heparin subcu t.i.d. and Protonix. 7. Disposition.  Pending clinical improvement.     Carla Mayhew, MD   ______________________________ Pearlean Brownie, M.D.    DC/MEDQ  D:  06/15/2010  T:  06/15/2010   Job:  [id number]  Electronically Signed by [name]  on 06/27/2010 08:36:58 AM Electronically Signed by Pearlean Brownie M.D. on 07/05/2010 01:41:43 PM

## 2010-09-11 ENCOUNTER — Ambulatory Visit (HOSPITAL_COMMUNITY)
Admission: RE | Admit: 2010-09-11 | Discharge: 2010-09-11 | Disposition: A | Discharge status: Home / Self Care | Payer: Medicare Other | Source: Ambulatory Visit | Attending: Internal Medicine | Admitting: Internal Medicine

## 2010-09-12 ENCOUNTER — Inpatient Hospital Stay (HOSPITAL_COMMUNITY)
Admission: EM | Admit: 2010-09-12 | Discharge: 2010-09-17 | DRG: 195 | Disposition: A | Discharge status: Nursing Home (Includes ICF) | Payer: Medicare Other | Attending: Internal Medicine | Admitting: Internal Medicine

## 2010-09-12 ENCOUNTER — Emergency Department (HOSPITAL_COMMUNITY): Payer: Medicare Other

## 2010-09-12 DIAGNOSIS — E119 Type 2 diabetes mellitus without complications: Secondary | ICD-10-CM | POA: Diagnosis present

## 2010-09-12 DIAGNOSIS — I10 Essential (primary) hypertension: Secondary | ICD-10-CM | POA: Diagnosis present

## 2010-09-12 DIAGNOSIS — Z79899 Other long term (current) drug therapy: Secondary | ICD-10-CM

## 2010-09-12 DIAGNOSIS — E785 Hyperlipidemia, unspecified: Secondary | ICD-10-CM | POA: Diagnosis present

## 2010-09-12 DIAGNOSIS — R0902 Hypoxemia: Secondary | ICD-10-CM | POA: Diagnosis present

## 2010-09-12 DIAGNOSIS — Z66 Do not resuscitate: Secondary | ICD-10-CM | POA: Diagnosis present

## 2010-09-12 DIAGNOSIS — Z96659 Presence of unspecified artificial knee joint: Secondary | ICD-10-CM

## 2010-09-12 DIAGNOSIS — Z8711 Personal history of peptic ulcer disease: Secondary | ICD-10-CM

## 2010-09-12 DIAGNOSIS — I059 Rheumatic mitral valve disease, unspecified: Secondary | ICD-10-CM | POA: Diagnosis present

## 2010-09-12 DIAGNOSIS — J189 Pneumonia, unspecified organism: Principal | ICD-10-CM | POA: Diagnosis present

## 2010-09-12 DIAGNOSIS — I519 Heart disease, unspecified: Secondary | ICD-10-CM | POA: Diagnosis present

## 2010-09-12 LAB — DIFFERENTIAL
Basophils Relative: 0 % (ref 0–1)
Monocytes Absolute: 1.1 10*3/uL — ABNORMAL HIGH (ref 0.1–1.0)
Monocytes Relative: 7 % (ref 3–12)
Neutro Abs: 13.6 10*3/uL — ABNORMAL HIGH (ref 1.7–7.7)

## 2010-09-12 LAB — URINALYSIS, ROUTINE W REFLEX MICROSCOPIC
Glucose, UA: NEGATIVE mg/dL
Hgb urine dipstick: NEGATIVE
Ketones, ur: NEGATIVE mg/dL
Protein, ur: NEGATIVE mg/dL

## 2010-09-12 LAB — CBC
HCT: 31.6 % — ABNORMAL LOW (ref 36.0–46.0)
Hemoglobin: 10.5 g/dL — ABNORMAL LOW (ref 12.0–15.0)
MCH: 28.9 pg (ref 26.0–34.0)
MCHC: 33.2 g/dL (ref 30.0–36.0)

## 2010-09-12 LAB — BASIC METABOLIC PANEL
BUN: 10 mg/dL (ref 6–23)
Calcium: 9.2 mg/dL (ref 8.4–10.5)
Creatinine, Ser: 0.51 mg/dL (ref 0.50–1.10)
GFR calc non Af Amer: >60 mL/min (ref >60)
Glucose, Bld: 172 mg/dL — ABNORMAL HIGH (ref 70–99)

## 2010-09-13 LAB — GLUCOSE, CAPILLARY
Glucose-Capillary: 185 mg/dL — ABNORMAL HIGH (ref 70–99)
Glucose-Capillary: 275 mg/dL — ABNORMAL HIGH (ref 70–99)

## 2010-09-13 LAB — HEMOGLOBIN A1C: Mean Plasma Glucose: 177 mg/dL — ABNORMAL HIGH (ref <117)

## 2010-09-15 LAB — CBC
MCHC: 32.8 g/dL (ref 30.0–36.0)
MCV: 89.2 fL (ref 78.0–100.0)
Platelets: 256 10*3/uL (ref 150–400)
RDW: 14.6 % (ref 11.5–15.5)
WBC: 10.3 10*3/uL (ref 4.0–10.5)

## 2010-09-15 LAB — GLUCOSE, CAPILLARY
Glucose-Capillary: 168 mg/dL — ABNORMAL HIGH (ref 70–99)
Glucose-Capillary: 166 mg/dL — ABNORMAL HIGH (ref 70–99)

## 2010-09-15 LAB — BASIC METABOLIC PANEL
Chloride: 93 mEq/L — ABNORMAL LOW (ref 96–112)
Creatinine, Ser: 0.53 mg/dL (ref 0.50–1.10)
GFR calc Af Amer: >60 mL/min (ref >60)
GFR calc non Af Amer: >60 mL/min (ref >60)

## 2010-09-15 LAB — VANCOMYCIN, TROUGH: Vancomycin Tr: 13.3 ug/mL (ref 10.0–20.0)

## 2010-09-16 LAB — GLUCOSE, CAPILLARY
Glucose-Capillary: 161 mg/dL — ABNORMAL HIGH (ref 70–99)
Glucose-Capillary: 100 mg/dL — ABNORMAL HIGH (ref 70–99)
Glucose-Capillary: 67 mg/dL — ABNORMAL LOW (ref 70–99)

## 2010-09-17 LAB — GLUCOSE, CAPILLARY
Glucose-Capillary: 92 mg/dL (ref 70–99)
Glucose-Capillary: 161 mg/dL — ABNORMAL HIGH (ref 70–99)
Glucose-Capillary: 179 mg/dL — ABNORMAL HIGH (ref 70–99)

## 2010-09-17 NOTE — Discharge Summary (Unsigned)
[name], SHVARTSMAN               ACCOUNT NO.:  [id number]  MEDICAL RECORD NO.:  [id number]  LOCATION:  1444                         FACILITY:  [facility]  PHYSICIAN:  Calvert Cantor, M.D.     DATE OF BIRTH:  [birth year]  DATE OF ADMISSION:  09/12/2010 DATE OF DISCHARGE:  09/16/2010                              DISCHARGE SUMMARY   PRIMARY CARE PHYSICIAN:  Lenon Curt. Chilton Si, MD.  PRESENTING COMPLAINT:  Shortness of breath.  DISCHARGE DIAGNOSES: 1. Healthcare-acquired pneumonia. 2. Hypoxia due to above.  PAST MEDICAL HISTORY: 1. Hypertension. 2. Diabetes mellitus. 3. Gastric ulcers. 4. Hyperlipidemia.  DISCHARGE MEDICATIONS: 1. Dextromethorphan 30 mg twice a day. 2. Doxycycline 100 mg twice a day for 9 more days. 3. Guaifenesin 600 mg twice a day. 4. Ipratropium bromide 1/2 mg q.6 h as needed. 5. Levofloxacin 500 mg daily for 9 days.  Continue the following: 1. Amlodipine 5 mg daily. 2. Albuterol inhaler 2 puffs q.6 h as needed for wheezing. 3. Clonidine 0.1 mg daily for blood pressure greater than 160/100. 4. Coreg 25 mg twice a day. 5. Furosemide 40 mg daily. 6. Glipizide 5 mg twice a day. 7. ICAP daily. 8. Metformin 500 mg 2 tablets twice a day. 9. Multivitamins daily. 10.Omeprazole 20 mg daily. 11.Simethicone 80 mg twice a day. 12.Temazepam 15 mg daily at bedtime. 13.Tums OTC 2 tablets 3 times a day as needed for heartburn. 14.Tylenol 650 mg 3 times a day. 15.Zocor 20 mg daily.  HOSPITAL COURSE:  This is a 77 year old female who presented to the hospital for shortness of breath that had been going on for 10 days. She was given a Z-Pak by her primary care physician, but her symptoms did not improve.  She did also admit with a productive cough but phlegm looked clear.  In the ER, she was noted to have a left upper lobe and lingular infiltrate and possibly some small pleural effusions.  The patient was admitted for healthcare-acquired pneumonia and started on  vancomycin, cefepime and Levaquin.  She improved quickly and has been switched over to p.o. Levaquin and p.o. doxycycline.  She is to complete 9 more days  for a total of 14 days.  All other issues remained stable.  She is slightly hypoxic on room air.  She is 90-92% at rest but when she ambulates she does require 1 L of oxygen to keep her saturation above 90%.  There is a question of possibly having heart failure and therefore an echo was ordered on admission.  The patient already takes Lasix at home.  A 2-D echo results, EF is 60-65% with grade 1 diastolic dysfunction, LV revealed mild to moderate LVH, no regional wall motion abnormalities. Mitral valve is moderate to severely calcified.  There is mild regurgitation.  Left and right atria are mildly dilated.  Pulmonary artery systolic pressure is increased at 45 mmHg and there is trivial pericardial effusion.  PHYSICAL EXAMINATION:  LUNGS:  Clear today.  Yesterday, she exhibited coarse breath sounds in the left upper and mid lung field.  She has a good respiratory effort.  No wheezing or rhonchi. HEART: Regular rate and rhythm.  No murmur. ABDOMEN:  Soft, nontender,  nondistended.  Bowel sounds positive. EXTREMITIES:  No cyanosis, clubbing or edema.  CONDITION ON DISCHARGE:  Stable.  FOLLOW UP:  Follow up with primary care physician and nursing facility within a week.  Of note, we did get a hemoglobin A1c while she was here.  This was noted to be elevated at 7.8.  Time on patient care today 45 minutes.     Calvert Cantor, M.D.     SR/MEDQ  D:  09/17/2010  T:  09/17/2010  Job:  [id number]  cc:   Lenon Curt. Chilton Si, M.D. Fax: [phone]

## 2010-09-18 LAB — CULTURE, BLOOD (ROUTINE X 2)
Culture: NO GROWTH
Culture: NO GROWTH

## 2010-10-02 NOTE — H&P (Signed)
[name], [name]               ACCOUNT NO.:  [id number]  MEDICAL RECORD NO.:  [id number]  LOCATION:  WLED                         FACILITY:  [facility]  PHYSICIAN:  Mauro Kaufmann, MD         DATE OF BIRTH:  08/24/1917  DATE OF ADMISSION:  09/12/2010 DATE OF DISCHARGE:                             HISTORY & PHYSICAL   PRIMARY CARE DOCTOR:  Lenon Curt. Chilton Si, MD  CHIEF COMPLAINT:  Shortness of breath.  HISTORY OF PRESENT ILLNESS:  A 77 year old female, who came to the hospital today this morning after the patient has been having worsening dyspnea over the past 10 days.  The patient has been coughing up clear phlegm.  The patient recently saw her primary care provider, Dr. Chilton Si last week and at that time she was given Z-Pak.  The patient did not improve.  At this morning, the patient was found to be hypoxic and her primary care asked her to go to the hospital for further evaluation. The patient had chills with fever, cough, and productive cough, but the phlegm was clear looking.  She denies any other symptom along with it. No nausea.  No vomiting.  Did have diarrhea associated with antibiotics. No chest pains.  No blurred vision.  No passing out spell.  The patient denies any dysuria, urgency, or frequency of urination.  PAST MEDICAL HISTORY:  Significant for; 1. Hypertension. 2. Diabetes mellitus. 3. Gastric ulcer. 4. Hyperlipidemia.  PAST SURGICAL HISTORY: 1. The patient had knee replacement. 2. Gastric ulcer operation.  SOCIAL HISTORY:  The patient is a nonsmoker.  No history of alcohol abuse.  No history of illicit drug abuse.  ALLERGIES:  Medication include; 1. CODEINE. 2. PROTONIX. 3. ADHESIVE TAPE. 4. ASPIRIN. 5. HYDROCODONE.  CURRENT MEDICATIONS:  The patient takes at home include; 1. Metformin 1000 p.o. b.i.d. 2. Amlodipine 5 mg p.o. daily. 3. Prilosec 20 mg once a day. 4. Lasix 40 mg once a day. 5. Zocor 20 mg once a day. 6. Coreg 25 p.o. b.i.d. 7. Glipizide  5 mg p.o. b.i.d. 8. Tylenol 2 tabs 3 times a day. 9. Tums 2 tabs 3 times a day. 10.Temazepam 50 mg at bedtime. 11.Multivitamin 1 tablet daily. 12.Clonidine 0.1 mg as needed daily.  REVIEW OF SYSTEMS:  As in the HPI.  Rest of the review of systems are negative. NEUROLOGIC:  The patient does not have any history of stroke or TIA in the past. CARDIOVASCULAR:  The patient denies any history of CAD.  Does take Lasix 40 mg p.o. daily, but denies any history of CHF.  PHYSICAL EXAMINATION:  VITAL SIGNS:  The patient's blood pressure is 154/77, pulse 88, respiration 20, and temp 98.0. HEENT:  Head is atraumatic and normocephalic.  Eyes, extraocular muscles intact.  Oral mucosa is pink and moist. NECK:  Supple. CHEST:  Decreased breath sounds in the both lower lobes. HEART:  S1 and S2.  Regular rate and rhythm. ABDOMEN:  Soft and nontender.  No organomegaly.  There is midline scar noted and incisional hernia. NEUROLOGIC:  Cranial II through XII grossly intact.  Motor sign is 5/5 in both upper and lower extremities. EXTREMITIES:  There is no  cyanosis, clubbing, or edema.  LABORATORY DATA:  Pertinent labs, WBC 16.0, hemoglobin 10.5, hematocrit 31.6, and platelet count of 340.  Sodium 133, potassium 3.7, chloride 93, CO2 of 30, BUN 10, creatinine 0.51, and glucose 172.  Urinalysis negative.  Blood cultures are pending at this time.  First set of cardiac enzymes are negative.  Troponin less than 0.30.  ASSESSMENT: 1. Hospital-acquired pneumonia. 2. Hypertension. 3. Diabetes mellitus. 4. Questionable congestive heart failure.  PLAN: 1. Hospital-acquired pneumonia.  The patient was in the hospital on     Jun 14, 2008, and was discharged on Jun 17, 2010, so technically     she is still within the 3 months period for the hospital-acquired     pneumonia.  The patient will be started on vancomycin, cefepime,     and Levaquin at this time.  Further recommendations as per the     rounding  physician and the patient's clinical response. 2. Hypertension.  The patient will be put on amlodipine and Coreg, and     also going to give the patient clonidine p.r.n., which she takes at     home.  The patient will put on clonidine 0.1 mg p.o. q.8 hours     p.r.n. 3. Diabetes mellitus.  We will put the patient on sliding scale of     insulin.  Hold the metformin at this time and also hold the     glipizide at this time. 4. Questionable congestive heart failure.  The patient takes Lasix 40     mg p.o. daily.  Never had echocardiogram before; so we will check a     2-D echo to rule out any diastolic dysfunction contributing to the     patient's dyspnea on exertion, which has been unrelieved even with     antibiotics at this time.  The patient will be continued on Lasix     40 mg p.o. daily.  I am also going to check a BNP.  The patient may     need IV Lasix in case the echo shows diastolic heart failure or the     BNP is elevated.  In the meantime, we will continue the patient on     Lasix 40 mg p.o. daily and monitor the patient on telemetry. 5. History of gastric ulcer.  The patient takes Prilosec 20 mg p.o.     daily, which will be continued. 6. DVT prophylaxis with Lovenox. 7. Code status.  The patient is a DNR.     Mauro Kaufmann, MD     GL/MEDQ  D:  09/12/2010  T:  09/12/2010  Job:  [id number]  Electronically Signed by Mauro Kaufmann  on 10/02/2010 07:57:55 AM

## 2010-10-11 NOTE — Discharge Summary (Signed)
NAMECEOLA, PARA               ACCOUNT NO.:  [id number]  MEDICAL RECORD NO.:  [id number]  LOCATION:  1444                         FACILITY:  [facility]  PHYSICIAN:  Calvert Cantor, M.D.     DATE OF BIRTH:  09-03-1917  DATE OF ADMISSION:  09/12/2010 DATE OF DISCHARGE:  09/17/2010                              DISCHARGE SUMMARY   PRIMARY CARE PHYSICIAN:  Lenon Curt. Chilton Si, MD.  PRESENTING COMPLAINT:  Shortness of breath.  DISCHARGE DIAGNOSES: 1. Healthcare-acquired pneumonia. 2. Hypoxia due to above.  PAST MEDICAL HISTORY: 1. Hypertension. 2. Diabetes mellitus. 3. Gastric ulcers. 4. Hyperlipidemia.  DISCHARGE MEDICATIONS: 1. Dextromethorphan 30 mg twice a day. 2. Doxycycline 100 mg twice a day for 9 more days. 3. Guaifenesin 600 mg twice a day. 4. Ipratropium bromide 1/2 mg q.6 h as needed. 5. Levofloxacin 500 mg daily for 9 days.  Continue the following: 1. Amlodipine 5 mg daily. 2. Albuterol inhaler 2 puffs q.6 h as needed for wheezing. 3. Clonidine 0.1 mg daily for blood pressure greater than 160/100. 4. Coreg 25 mg twice a day. 5. Furosemide 40 mg daily. 6. Glipizide 5 mg twice a day. 7. ICAP daily. 8. Metformin 500 mg 2 tablets twice a day. 9. Multivitamins daily. 10.Omeprazole 20 mg daily. 11.Simethicone 80 mg twice a day. 12.Temazepam 15 mg daily at bedtime. 13.Tums OTC 2 tablets 3 times a day as needed for heartburn. 14.Tylenol 650 mg 3 times a day. 15.Zocor 20 mg daily.  HOSPITAL COURSE:  This is a 76 year old female who presented to the hospital for shortness of breath that had been going on for 10 days. She was given a Z-Pak by her primary care physician, but her symptoms did not improve.  She did also admit with a productive cough but phlegm looked clear.  In the ER, she was noted to have a left upper lobe and lingular infiltrate and possibly some small pleural effusions.  The patient was admitted for healthcare-acquired pneumonia and started on  vancomycin, cefepime and Levaquin.  She improved quickly and has been switched over to p.o. Levaquin and p.o. doxycycline.  She is to complete 9 more days  for a total of 14 days.  All other issues remained stable.  She is slightly hypoxic on room air.  She is 90-92% at rest but when she ambulates she does require 1 L of oxygen to keep her saturation above 90%.  There is a question of possibly having heart failure and therefore an echo was ordered on admission.  The patient already takes Lasix at home.  A 2-D echo results, EF is 60-65% with grade 1 diastolic dysfunction, LV revealed mild to moderate LVH, no regional wall motion abnormalities. Mitral valve is moderate to severely calcified.  There is mild regurgitation.  Left and right atria are mildly dilated.  Pulmonary artery systolic pressure is increased at 45 mmHg and there is trivial pericardial effusion.  PHYSICAL EXAMINATION:  LUNGS:  Clear today.  Yesterday, she exhibited coarse breath sounds in the left upper and mid lung field.  She has a good respiratory effort.  No wheezing or rhonchi. HEART: Regular rate and rhythm.  No murmur. ABDOMEN:  Soft, nontender,  nondistended.  Bowel sounds positive. EXTREMITIES:  No cyanosis, clubbing or edema.  CONDITION ON DISCHARGE:  Stable.  FOLLOW UP:  Follow up with primary care physician within a week.  Of note, we did get a hemoglobin A1c while she was here.  This was noted to be elevated at 7.8.  Time on patient care today 45 minutes.     Calvert Cantor, M.D.     SR/MEDQ  D:  09/17/2010  T:  10/08/2010  Job:  [id number]  cc:   Lenon Curt. Chilton Si, M.D. Fax: [phone]  Electronically Signed by Calvert Cantor M.D. on 10/11/2010 01:22:51 PM

## 2011-02-04 DIAGNOSIS — M25519 Pain in unspecified shoulder: Secondary | ICD-10-CM

## 2011-02-04 HISTORY — DX: Pain in unspecified shoulder: M25.519

## 2011-04-09 ENCOUNTER — Encounter: Payer: Self-pay | Admitting: Pulmonary Disease

## 2011-04-10 ENCOUNTER — Ambulatory Visit (INDEPENDENT_AMBULATORY_CARE_PROVIDER_SITE_OTHER): Payer: Medicare Other | Admitting: Pulmonary Disease

## 2011-04-10 ENCOUNTER — Encounter: Payer: Self-pay | Admitting: Pulmonary Disease

## 2011-04-10 ENCOUNTER — Ambulatory Visit (INDEPENDENT_AMBULATORY_CARE_PROVIDER_SITE_OTHER)
Admission: RE | Admit: 2011-04-10 | Discharge: 2011-04-10 | Disposition: A | Discharge status: Home / Self Care | Payer: Medicare Other | Source: Ambulatory Visit | Attending: Pulmonary Disease | Admitting: Pulmonary Disease

## 2011-04-10 VITALS — BP 156/68 | HR 74 | Temp 97.7°F | Ht 64.0 in | Wt 166.2 lb

## 2011-04-10 DIAGNOSIS — R0600 Dyspnea, unspecified: Secondary | ICD-10-CM

## 2011-04-10 DIAGNOSIS — R0609 Other forms of dyspnea: Secondary | ICD-10-CM

## 2011-04-10 DIAGNOSIS — J479 Bronchiectasis, uncomplicated: Secondary | ICD-10-CM

## 2011-04-10 DIAGNOSIS — R0989 Other specified symptoms and signs involving the circulatory and respiratory systems: Secondary | ICD-10-CM

## 2011-04-10 DIAGNOSIS — R918 Other nonspecific abnormal finding of lung field: Secondary | ICD-10-CM | POA: Insufficient documentation

## 2011-04-10 HISTORY — DX: Bronchiectasis, uncomplicated: J47.9

## 2011-04-10 HISTORY — DX: Other forms of dyspnea: R06.09

## 2011-04-10 HISTORY — DX: Dyspnea, unspecified: R06.00

## 2011-04-10 NOTE — Patient Instructions (Addendum)
Will schedule for breathing studies, and see you back same day for review. Work on TEFL teacher.  Chest X-ray on the way out today, we will call you with results.

## 2011-04-10 NOTE — Assessment & Plan Note (Signed)
The patient has dyspnea on exertion since last fall that is progressive since the beginning of the year.  Her history and exam are not overly suggestive of a pulmonary etiology, but with her history of bronchiectasis I wonder if she may have some underlying COPD.  She had spirometry back in the summer, however the results were not valid secondary to poor technique and an expiratory time of less than 6 seconds.  At this point, I think she needs to have full pulmonary function studies for evaluation.  If these are unremarkable, would consider a cardiac workup.  I also think that her weight, deconditioning, and debility are also playing roles.

## 2011-04-10 NOTE — Progress Notes (Signed)
  Subjective:    Patient ID: [name], female    DOB: 1917-05-23, 78 y.o.   MRN: [id number]  HPI The patient is a 78 year old female who I been asked to see for dyspnea on exertion.  The patient did have an episode of pneumonia last summer, but recuperated from this and returned back to baseline.  Her dyspnea on exertion started in the fall, and has been worsening since January of this year.  The patient states that she will get winded making a bed, or bringing groceries in from the car.  She thinks that she can walk up to one block without getting shortness of breath, as long as she can pace herself.  The patient has no significant cough except first thing in the morning from postnasal drip she will produce clear mucus.  She denies any chest congestion.  She has no history of asthma or other pulmonary disease.  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  The patient has had some left lower extremity edema, but denies any history of prior heart disease.  She did have a cardiac workup about 4-5 years ago with Dr. Jacinto Halim.  She also has a history of mild anemia, but her last check was adequate.   Review of Systems  Constitutional: Negative for fever and unexpected weight change.  HENT: Negative for ear pain, nosebleeds, congestion, sore throat, rhinorrhea, sneezing, trouble swallowing, dental problem, postnasal drip and sinus pressure.   Eyes: Negative for redness and itching.  Respiratory: Positive for cough and shortness of breath. Negative for chest tightness and wheezing.   Cardiovascular: Negative for palpitations and leg swelling.  Gastrointestinal: Negative for nausea and vomiting.       Acid Heartburn Indigestion  Genitourinary: Negative for dysuria.  Musculoskeletal: Negative for joint swelling.  Skin: Negative for rash.  Neurological: Negative for headaches.    Hematological: Does not bruise/bleed easily.  Psychiatric/Behavioral: Negative for dysphoric mood. The patient is not nervous/anxious.        Objective:   Physical Exam Constitutional:  Overweight female, no acute distress  HENT:  Nares patent without discharge  Oropharynx without exudate, palate and uvula are normal  Eyes:  Perrla, eomi, no scleral icterus  Neck:  No JVD, no TMG  Cardiovascular:  Normal rate, regular rhythm, no rubs or gallops.  No murmurs        Intact distal pulses  Pulmonary :  Normal breath sounds, no stridor or respiratory distress   A few scattered crackles, no wheezing  Abdominal:  Soft, nondistended, bowel sounds present.  No tenderness noted.   Musculoskeletal:  mild lower extremity edema noted.  Lymph Nodes:  No cervical lymphadenopathy noted  Skin:  No cyanosis noted  Neurologic:  Alert, appropriate, moves all 4 extremities without obvious deficit.         Assessment & Plan:

## 2011-04-10 NOTE — Assessment & Plan Note (Signed)
The patient has a nodular infiltrate on chest CT in 2011 that is most characteristic of MAC.  She has nothing to indicate an active infection, and most likely represents colonization.

## 2011-04-10 NOTE — Assessment & Plan Note (Signed)
The patient has bronchiectatic changes on her CT from 2011 which are very mild.  The patient does not have a chronic cough or mucus production, and does not have a history of recurrent pulmonary infections.  Will monitor for now.

## 2011-04-11 ENCOUNTER — Telehealth: Payer: Self-pay | Admitting: Pulmonary Disease

## 2011-04-11 NOTE — Telephone Encounter (Signed)
Notes Recorded by Barbaraann Share, MD on 04/11/2011 at 11:21 AM Please let pt know that her cxr shows no new findings to explain her shortness of breath.   LMTCB

## 2011-04-14 NOTE — Telephone Encounter (Signed)
Pt return call. Please cb at [phone]

## 2011-04-14 NOTE — Telephone Encounter (Signed)
Spoke with pt and notified of cxr results per [facility]. Pt verbalized understanding and states nothing further needed.

## 2011-04-23 ENCOUNTER — Encounter: Payer: Self-pay | Admitting: Pulmonary Disease

## 2011-04-23 ENCOUNTER — Ambulatory Visit (HOSPITAL_COMMUNITY)
Admission: RE | Admit: 2011-04-23 | Discharge: 2011-04-23 | Disposition: A | Discharge status: Home / Self Care | Payer: Medicare Other | Source: Ambulatory Visit | Attending: Pulmonary Disease | Admitting: Pulmonary Disease

## 2011-04-23 ENCOUNTER — Ambulatory Visit (INDEPENDENT_AMBULATORY_CARE_PROVIDER_SITE_OTHER): Payer: Medicare Other | Admitting: Pulmonary Disease

## 2011-04-23 VITALS — BP 152/74 | HR 69 | Temp 97.5°F | Ht 62.0 in | Wt 165.0 lb

## 2011-04-23 DIAGNOSIS — R05 Cough: Secondary | ICD-10-CM | POA: Insufficient documentation

## 2011-04-23 DIAGNOSIS — R0989 Other specified symptoms and signs involving the circulatory and respiratory systems: Secondary | ICD-10-CM

## 2011-04-23 DIAGNOSIS — R062 Wheezing: Secondary | ICD-10-CM | POA: Insufficient documentation

## 2011-04-23 DIAGNOSIS — R059 Cough, unspecified: Secondary | ICD-10-CM | POA: Insufficient documentation

## 2011-04-23 DIAGNOSIS — R0600 Dyspnea, unspecified: Secondary | ICD-10-CM

## 2011-04-23 DIAGNOSIS — R0609 Other forms of dyspnea: Secondary | ICD-10-CM

## 2011-04-23 DIAGNOSIS — J988 Other specified respiratory disorders: Secondary | ICD-10-CM | POA: Insufficient documentation

## 2011-04-23 LAB — PULMONARY FUNCTION TEST

## 2011-04-23 MED ORDER — ALBUTEROL SULFATE (5 MG/ML) 0.5% IN NEBU
2.5000 mg | INHALATION_SOLUTION | Freq: Once | RESPIRATORY_TRACT | Status: AC
  Administered 2011-04-23: 2.5 mg via RESPIRATORY_TRACT

## 2011-04-23 NOTE — Progress Notes (Signed)
  Subjective:    Patient ID: [name], female    DOB: December 30, 1917, 78 y.o.   MRN: [id number]  HPI The patient comes in today for followup after her recent PFTs, ordered as part of a workup for dyspnea on exertion.  The patient has mild bronchiectasis, and the question was raised whether she may have airflow obstruction.  She was unable to complete the maneuvers for lung volumes or DLCO, but her  spirometry clearly shows mild airflow obstruction.  I have reviewed the study with her in detail, and answered all of her questions.   Review of Systems  Constitutional: Negative for fever and unexpected weight change.  HENT: Negative for ear pain, nosebleeds, congestion, sore throat, rhinorrhea, sneezing, trouble swallowing, dental problem, postnasal drip and sinus pressure.   Eyes: Negative for redness and itching.  Respiratory: Positive for cough, shortness of breath and wheezing. Negative for chest tightness.   Cardiovascular: Positive for leg swelling. Negative for palpitations.  Gastrointestinal: Negative for nausea and vomiting.  Genitourinary: Negative for dysuria.  Musculoskeletal: Negative for joint swelling.  Skin: Negative for rash.  Neurological: Positive for headaches.  Hematological: Does not bruise/bleed easily.  Psychiatric/Behavioral: Negative for dysphoric mood. The patient is not nervous/anxious.        Objective:   Physical Exam Well-developed female in no acute distress Nose without purulence or discharge noted Lower extremities without significant edema, no cyanosis Alert and oriented, moves all 4 extremities.       Assessment & Plan:

## 2011-04-23 NOTE — Assessment & Plan Note (Signed)
The patient has mild airflow obstruction based on her spirometry.  She really has no significant smoking history, and therefore I suspect this is either due to senile emphysema given her age or related to her underlying bronchiectasis.  It is unclear whether this is even contributing to her symptoms.  I would like to start her on a long-acting beta agonist as a trial, but if she does not respond, she may need a cardiac evaluation through her primary care doctor.

## 2011-04-23 NOTE — Patient Instructions (Signed)
You do have mild copd by your breathing studies, but it is unclear if this is the cause of your increased shortness of breath.  Would like to try you on arcapta one inhalation each am.  Please call me in 3 weeks to give update whether this has helped your breathing. If the inhaler does not help you, need to consider a cardiac evaluation with your primary md.

## 2011-05-14 ENCOUNTER — Telehealth: Payer: Self-pay | Admitting: Pulmonary Disease

## 2011-05-14 MED ORDER — INDACATEROL MALEATE 75 MCG IN CAPS: 1.0000 | ORAL_CAPSULE | Freq: Every day | RESPIRATORY_TRACT | Status: DC

## 2011-05-14 NOTE — Telephone Encounter (Signed)
If she has seen a 50% improvement in just 3 weeks, this is significant.  Would continue with current medications, and followup with me in 2mos.

## 2011-05-14 NOTE — Telephone Encounter (Signed)
I spoke with pt and she states her breathing is some better by about 50% better. She states her cough is much better. Her stamina is still has no came back. She thinks she still may to see a cardiologists to see if her heart has anything to do with her SOB. She states she still gets SOB w. Exertion. Please advise KC, thanks

## 2011-05-14 NOTE — Telephone Encounter (Signed)
LMTCB

## 2011-05-14 NOTE — Telephone Encounter (Signed)
Pt returned call, advised of KC's recs.  Pt verbalized her understanding and requested an rx be sent to the pharmacy.  Per KC, this is fine.  rx sent and coupon card mailed to pt's verified home address.  appt scheduled with KC for 6.11.13 @ 1100.  Pt okay with this date and time.  Nothing further needed, will sign off.

## 2011-05-15 ENCOUNTER — Telehealth: Payer: Self-pay | Admitting: Pulmonary Disease

## 2011-05-15 NOTE — Telephone Encounter (Signed)
Member ID # [id number]. Arcapta is not preferred with pt's insurance. PA initiated and sent for clinical review. If insurance denies the Arcapta, the preferred medications are:  Spiriva, Advair Diskus, Flovent Diskus, Asmanex and Qvar. Will forward to [facility] so she may follow-up on this. Awaiting insurance response.   Ref # is [id number].

## 2011-05-19 ENCOUNTER — Telehealth: Payer: Self-pay | Admitting: Pulmonary Disease

## 2011-05-19 NOTE — Telephone Encounter (Signed)
Form received and placed in [facility] very important folder. Carron Curie, CMA

## 2011-05-19 NOTE — Telephone Encounter (Signed)
Form faxed to [facility]. Form placed in triage to await approval/denial. Carron Curie, CMA

## 2011-05-19 NOTE — Telephone Encounter (Signed)
Done and returned to triage.

## 2011-05-19 NOTE — Telephone Encounter (Signed)
The ref number for med is [id number] she also stated she will be faxing paper over.Hazel Sams

## 2011-05-21 NOTE — Telephone Encounter (Signed)
See phone note dated 05/19/11 for more information regarding Arcapta PA.

## 2011-05-21 NOTE — Telephone Encounter (Signed)
Response received and Arcapta has been APPROVED through 02/03/2012. Pt and pharmacy have been notified. Approval placed in Dr. Shelle Iron scan folder.

## 2011-07-15 ENCOUNTER — Encounter: Payer: Self-pay | Admitting: Pulmonary Disease

## 2011-07-15 ENCOUNTER — Ambulatory Visit (INDEPENDENT_AMBULATORY_CARE_PROVIDER_SITE_OTHER): Payer: Medicare Other | Admitting: Pulmonary Disease

## 2011-07-15 VITALS — BP 122/54 | HR 77 | Temp 98.0°F | Ht 64.0 in | Wt 171.4 lb

## 2011-07-15 DIAGNOSIS — J479 Bronchiectasis, uncomplicated: Secondary | ICD-10-CM

## 2011-07-15 DIAGNOSIS — R0989 Other specified symptoms and signs involving the circulatory and respiratory systems: Secondary | ICD-10-CM

## 2011-07-15 DIAGNOSIS — R0600 Dyspnea, unspecified: Secondary | ICD-10-CM

## 2011-07-15 DIAGNOSIS — R0609 Other forms of dyspnea: Secondary | ICD-10-CM

## 2011-07-15 NOTE — Assessment & Plan Note (Signed)
The patient is doing well from this standpoint, with no significant chest congestion or purulent mucus.

## 2011-07-15 NOTE — Patient Instructions (Addendum)
Continue on arcapta each day. If you are continuing to have shortness of breath, would suggest a cardiac evaluation to see if this is a contributor.  I will send a note to Dr. Chilton Si. Stay as active as possible. If early am cough continues to be an issue, could try chlorpheniramine 4mg  at bedtime for postnasal drip to see if it will help.  If doing well, would like to see you back in 4mos.  Please call if having ongoing issues.

## 2011-07-15 NOTE — Assessment & Plan Note (Signed)
The patient has known mild obstructive airways disease secondary to her bronchiectasis, and has seen an improvement in her breathing with her long acting bronchodilator.  However, she continues to have dyspnea on exertion that is bothersome to her, and it is unclear whether she may have a concomitant cardiac issues as well.  She is also overweight and clearly deconditioned.  Her lung disease is fairly mild, and therefore I would like for her to have a cardiac evaluation prior to adding other pulmonary medications.  This may simply be an issue with her weight, deconditioning, and age.

## 2011-07-15 NOTE — Progress Notes (Signed)
  Subjective:    Patient ID: [name], female    DOB: 1917/04/23, 78 y.o.   MRN: [id number]  HPI The patient comes in today for followup of her known bronchiectasis with mild air flow obstruction.  She has not had any significant chest congestion or productive cough since the last visit.  She does have a mild early morning cough that clears as soon as she starts her day.  This is most likely secondary to postnasal drip, and the patient does have issues with this.  She has been started on Arcapta, and did see improvement in her breathing with this.  However, she continues to have dyspnea on exertion and is hoping for something that may improve this.  She denies any pleuritic or pressure-like chest pain, and has not had calf discomfort or significant edema.   Review of Systems  Constitutional: Negative.  Negative for fever and unexpected weight change.  HENT: Positive for sneezing. Negative for ear pain, nosebleeds, congestion, sore throat, rhinorrhea, trouble swallowing, dental problem, postnasal drip and sinus pressure.   Eyes: Negative.  Negative for redness and itching.  Respiratory: Positive for cough and shortness of breath. Negative for chest tightness and wheezing.   Cardiovascular: Positive for leg swelling. Negative for palpitations.  Gastrointestinal: Negative.  Negative for nausea and vomiting.  Genitourinary: Negative.  Negative for dysuria.  Musculoskeletal: Negative.  Negative for joint swelling.  Skin: Negative.  Negative for rash.  Neurological: Negative.  Negative for headaches.  Hematological: Negative.  Does not bruise/bleed easily.  Psychiatric/Behavioral: Negative.  Negative for dysphoric mood. The patient is not nervous/anxious.        Objective:   Physical Exam Frail-appearing female in no acute distress Nose without purulence or discharge noted Chest with very faint basilar crackles, excellent air flow, no wheezing Cardiac exam is regular rate and  rhythm Lower extremities with no edema, no cyanosis Alert and oriented, moves all 4 extremities.       Assessment & Plan:

## 2011-09-30 ENCOUNTER — Other Ambulatory Visit: Payer: Self-pay | Admitting: Pulmonary Disease

## 2011-11-14 ENCOUNTER — Ambulatory Visit (INDEPENDENT_AMBULATORY_CARE_PROVIDER_SITE_OTHER): Payer: Medicare Other | Admitting: Pulmonary Disease

## 2011-11-14 ENCOUNTER — Encounter: Payer: Self-pay | Admitting: Pulmonary Disease

## 2011-11-14 VITALS — BP 122/62 | HR 69 | Temp 97.6°F | Ht 64.0 in | Wt 170.0 lb

## 2011-11-14 DIAGNOSIS — R0989 Other specified symptoms and signs involving the circulatory and respiratory systems: Secondary | ICD-10-CM

## 2011-11-14 DIAGNOSIS — R0600 Dyspnea, unspecified: Secondary | ICD-10-CM

## 2011-11-14 DIAGNOSIS — J479 Bronchiectasis, uncomplicated: Secondary | ICD-10-CM

## 2011-11-14 NOTE — Assessment & Plan Note (Signed)
The patient has nothing by her history or exam to suggest an acute exacerbation.  She is not having any purulent mucus or congestion.

## 2011-11-14 NOTE — Progress Notes (Signed)
  Subjective:    Patient ID: [name], female    DOB: [birth year], 78 y.o.   MRN: [id number]  HPI The patient comes in today for followup of her known bronchiectasis with mild airflow obstruction.  She was tried on arcapta for dyspnea on exertion, and did see some improvement.  She has continued to have dyspnea on exertion and is not totally satisfied with her exertional tolerance.  I have explained to her that her obstructive disease accounts for only a small portion of her symptoms.  She has seen her cardiologist, but it is unclear if any type of workup is being undertaken.  She is still having a mild cough, primarily in the morning and productive of clear mucus.   Review of Systems  Constitutional: Negative for fever and unexpected weight change.  HENT: Negative for ear pain, nosebleeds, congestion, sore throat, rhinorrhea, sneezing, trouble swallowing, dental problem, postnasal drip and sinus pressure.   Eyes: Negative for redness and itching.  Respiratory: Positive for cough and shortness of breath ( with heavy exertion). Negative for chest tightness and wheezing.   Cardiovascular: Negative for palpitations and leg swelling.  Gastrointestinal: Negative for nausea and vomiting.  Genitourinary: Negative for dysuria.  Musculoskeletal: Negative for joint swelling.  Skin: Negative for rash.  Neurological: Negative for headaches.  Hematological: Does not bruise/bleed easily.  Psychiatric/Behavioral: Negative for dysphoric mood. The patient is not nervous/anxious.        Objective:   Physical Exam Overweight female in no acute distress Nose without purulence or discharge noted Chest clear to auscultation, no wheezing Cardiac exam with regular rate and rhythm Lower extremities with minimal edema, no cyanosis Alert and oriented, moves all 4 extremities.       Assessment & Plan:

## 2011-11-14 NOTE — Assessment & Plan Note (Signed)
The patient feels that her breathing is better with a LABA, but is still not satisfied with her symptoms.  I have told her that weight and conditioning play significant roles in her symptoms, and cannot exclude a cardiac component as well.

## 2011-11-14 NOTE — Patient Instructions (Addendum)
Continue with arcapta once a day, and will send in a prescription for you. Can check to see if foradil one twice a day is cheaper, and if so, can change you to this. Work on conditioning and weight loss as much as you can. followup with me in one year.

## 2012-01-12 ENCOUNTER — Ambulatory Visit: Payer: Medicare Other

## 2012-01-12 ENCOUNTER — Ambulatory Visit (INDEPENDENT_AMBULATORY_CARE_PROVIDER_SITE_OTHER): Payer: Medicare Other | Admitting: Family Medicine

## 2012-01-12 VITALS — BP 154/68 | HR 91 | Temp 97.4°F | Resp 20 | Ht 62.0 in | Wt 171.0 lb

## 2012-01-12 DIAGNOSIS — R05 Cough: Secondary | ICD-10-CM

## 2012-01-12 DIAGNOSIS — E119 Type 2 diabetes mellitus without complications: Secondary | ICD-10-CM

## 2012-01-12 DIAGNOSIS — R059 Cough, unspecified: Secondary | ICD-10-CM

## 2012-01-12 DIAGNOSIS — D649 Anemia, unspecified: Secondary | ICD-10-CM

## 2012-01-12 DIAGNOSIS — R0989 Other specified symptoms and signs involving the circulatory and respiratory systems: Secondary | ICD-10-CM

## 2012-01-12 LAB — POCT CBC
Lymph, poc: 1.6 (ref 0.6–3.4)
MCHC: 29.6 g/dL — AB (ref 31.8–35.4)
MPV: 8.2 fL (ref 0–99.8)
POC Granulocyte: 10.5 — AB (ref 2–6.9)
POC LYMPH PERCENT: 12.2 %L (ref 10–50)
POC MID %: 6.6 %M (ref 0–12)
RDW, POC: 17.6 %

## 2012-01-12 MED ORDER — IPRATROPIUM BROMIDE 0.02 % IN SOLN
0.5000 mg | Freq: Once | RESPIRATORY_TRACT | Status: AC
  Administered 2012-01-12: 0.5 mg via RESPIRATORY_TRACT

## 2012-01-12 MED ORDER — MOXIFLOXACIN HCL 400 MG PO TABS: 400.0000 mg | ORAL_TABLET | Freq: Every day | ORAL | Status: DC

## 2012-01-12 MED ORDER — PREDNISONE 20 MG PO TABS: ORAL_TABLET | ORAL | Status: DC

## 2012-01-12 NOTE — Patient Instructions (Addendum)
We are going to treat you for a COPD exacerbation with prednisone and Avelox (an antibiotic).  Be careful because prednisone can cause your blood sugar to go up. Please let us know if you are not feeling better in the next 2 days- Sooner if worse.   Dr. Shelle Iron would like to see you in the next month- please call and schedule an appt with him for after the holidays

## 2012-01-12 NOTE — Progress Notes (Signed)
[facility] [address], [place] [address] [phone]  Date:  01/12/2012   Name:  [name]   DOB:  October 27, 1917   MRN:  [id number]  PCP:  Kimber Relic, MD    Chief Complaint: Cough, Shortness of Breath and Fever   History of Present Illness:  [name] is a 78 y.o. very pleasant female patient who presents with the following:  She is here with an illness since Friday-today is Monday.  She has noted a cough and has been feeling a bit SOB.  She has mild COPD/ bronchiectasis so these symptoms are not completely out of the norm.  However, she also has had pneumonia a couple of times in the past and was concerned that this could be back.    She has noted a low grade fever but nothing above 99 degrees.  She feels congested and has been taking a good bit of mucinex.  No other symptoms such as a ST, earache, or nasal symptoms.  She does not have any CP except with coughing and has no LE edema.  She does not use oxygen and has never been on home O2. She does use an inhaler- Arcapta.  She sees Dr. Shelle Iron at [facility] pulmonary for her pulmonary issues.   Her PCP is Art Chilton Si, who manages her DM with oral medications. She notes that her FBS have been around 120 recently which is a bit higher than is usual for her  Patient Active Problem List  Diagnosis  . DOE (dyspnea on exertion)  . Bronchiectasis without acute exacerbation  . Pulmonary nodules    Past Medical History  Diagnosis Date  . Diabetes mellitus   . Hypertension   . Arthritis   . Cataract   . Ulcer   . COPD (chronic obstructive pulmonary disease)     Past Surgical History  Procedure Date  . Total knee arthroplasty     bilat  . Other surgical history 2008    Ulcer surgery   . Small intestine surgery     History  Substance Use Topics  . Smoking status: Former Smoker -- 0.3 packs/day for 10 years    Types: Cigarettes    Quit date: 02/03/1973  . Smokeless tobacco: Never Used      Comment: former casual smoker  . Alcohol Use: No    Family History  Problem Relation Age of Onset  . Heart disease Father   . Cancer Sister     Allergies  Allergen Reactions  . Adhesive (Tape)   . Aspirin   . Codeine   . Enalapril   . Pantoprazole Sodium     Medication list has been reviewed and updated.  Current Outpatient Prescriptions on File Prior to Visit  Medication Sig Dispense Refill  . acetaminophen (TYLENOL) 650 MG CR tablet Take 1,300 mg by mouth 3 (three) times daily.      Marland Kitchen amLODipine (NORVASC) 5 MG tablet Take 5 mg by mouth daily.      Marland Kitchen ARCAPTA NEOHALER 75 MCG CAPS PLACE 1 CAPSULE INTO INHALER AND INHALE DAILY.  30 capsule  3  . BROMDAY 0.09 % SOLN As directed in left eye      . calcium carbonate (TUMS EX) 750 MG chewable tablet Chew 1 tablet by mouth as needed.      . cloNIDine (CATAPRES) 0.1 MG tablet Take 0.1 mg by mouth as needed.       . furosemide (LASIX) 40  MG tablet Take 40 mg by mouth daily.      Marland Kitchen glipiZIDE (GLUCOTROL) 5 MG tablet Take 10 mg by mouth 2 (two) times daily.      . metFORMIN (GLUCOPHAGE) 500 MG tablet Take 1,000 mg by mouth 2 (two) times daily.      . Multiple Vitamin (MULTIVITAMIN) capsule Take 1 capsule by mouth daily.      . Multiple Vitamins-Minerals (EYE VITAMINS PO) Take by mouth daily.      . Nebivolol HCl (BYSTOLIC) 20 MG TABS Take 1 tablet by mouth daily.      Marland Kitchen omeprazole (PRILOSEC) 40 MG capsule Take 40 mg by mouth daily.      . simvastatin (ZOCOR) 20 MG tablet Take 20 mg by mouth every evening.      . temazepam (RESTORIL) 15 MG capsule Take 15 mg by mouth at bedtime as needed.      . Simethicone (GAS RELIEF 80 PO) Take by mouth daily.        Review of Systems:  As per HPI- otherwise negative.   Physical Examination: Filed Vitals:   01/12/12 1336  BP: 154/68  Pulse: 91  Temp: 97.4 F (36.3 C)  Resp: 28   Filed Vitals:   01/12/12 1336  Height: 5\' 2"  (1.575 m)  Weight: 171 lb (77.565 kg)   O2 sat 96% on  Dr. Teddy Spike office on 11/14/11, 91% on 07/15/11 Body mass index is 31.28 kg/(m^2). Ideal Body Weight: Weight in (lb) to have BMI = 25: 136.4   GEN: WDWN, NAD, Non-toxic, A & O x 3 HEENT: Atraumatic, Normocephalic. Neck supple. No masses, No LAD. Bilateral TM wnl, oropharynx normal.  PEERL,EOMI.   Ears and Nose: No external deformity. CV: RRR, No M/G/R. No JVD. No thrill. No extra heart sounds. PULM: no crackles or rhonchi but she does have wheezing bilaterally. No retractions. No resp. distress. No accessory muscle use. ABD: S, NT, ND, +BS. No rebound. No HSM. EXTR: No c/c/e NEURO Normal gait.  PSYCH: Normally interactive. Conversant. Not depressed or anxious appearing.  Calm demeanor.   [facility] reading (PRIMARY) by  Dr. Patsy Lager. CXR: mild changes throughout, but no definite infiltrate  CHEST - 2 VIEW  Comparison: 04/10/2011 [facility])  Findings: Heart is at the upper limits of normal in size. Pulmonary vascularity is normal. Prominent peribronchial thickening consistent with bronchitis. No consolidative infiltrates or effusions. No acute osseous abnormality. Extensive calcification of the thoracic aorta.  IMPRESSION: New bronchitic changes.  Given atrovent neb: after her O2 sat was 89- 90%, she did not notice much improvement but her lungs did sound better.    Results for orders placed in visit on 01/12/12  POCT GLYCOSYLATED HEMOGLOBIN (HGB A1C)      Component Value Range   Hemoglobin A1C 7.0    POCT CBC      Component Value Range   WBC 12.9 (*) 4.6 - 10.2 K/uL   Lymph, poc 1.6  0.6 - 3.4   POC LYMPH PERCENT 12.2  10 - 50 %L   MID (cbc) 0.9  0 - 0.9   POC MID % 6.6  0 - 12 %M   POC Granulocyte 10.5 (*) 2 - 6.9   Granulocyte percent 81.2 (*) 37 - 80 %G   RBC 4.22  4.04 - 5.48 M/uL   Hemoglobin 10.7 (*) 12.2 - 16.2 g/dL   HCT, POC 40.9 (*) 81.1 - 47.9 %   MCV 85.9  80 - 97 fL   MCH, POC  25.4 (*) 27 - 31.2 pg   MCHC 29.6 (*) 31.8 - 35.4 g/dL   RDW, POC 04.5      Platelet Count, POC 340  142 - 424 K/uL   MPV 8.2  0 - 99.8 fL    Called and discussed with Dr. Shelle Iron.  He would like to see her back in about one month, and suggested a quinolone antibiotic due to her underlying lung problems.  Assessment and Plan: 1. Cough  DG Chest 2 View, ipratropium (ATROVENT) nebulizer solution 0.5 mg, POCT CBC, moxifloxacin (AVELOX) 400 MG tablet, predniSONE (DELTASONE) 20 MG tablet  2. Diabetes mellitus  POCT glycosylated hemoglobin (Hb A1C)  3. Anemia     COPD exacerbation/ bronchitis.  Will treat with avelox and prednisone.  She is aware that prednisone will raise her blood sugar, but she has dealt with this in the past and knows what to expect. Her A1c shows reasonable control of her glucose.  She will continue her long acting B agonist inhaler.  Atrovent did not give her much relief so will not add it at this time.   Cover with avelox for one week.  Close follow- up if not better  Suspect anemia of chronic disease- her Hg has been stable for at least a year.  Will send a note to Art Green regarding her anemia in case any other follow- up is needed.    Meds ordered this encounter  Medications  . ipratropium (ATROVENT) nebulizer solution 0.5 mg    Sig:   . moxifloxacin (AVELOX) 400 MG tablet    Sig: Take 1 tablet (400 mg total) by mouth daily.    Dispense:  7 tablet    Refill:  0  . predniSONE (DELTASONE) 20 MG tablet    Sig: Take 2 pills a day for 2 days, then 1 pill a day for 4 days    Dispense:  8 tablet    Refill:  0    Trenell Concannon, MD

## 2012-02-06 ENCOUNTER — Encounter: Payer: Self-pay | Admitting: Pulmonary Disease

## 2012-02-06 ENCOUNTER — Ambulatory Visit (INDEPENDENT_AMBULATORY_CARE_PROVIDER_SITE_OTHER): Payer: Medicare Other | Admitting: Pulmonary Disease

## 2012-02-06 VITALS — BP 120/70 | HR 104 | Temp 98.8°F | Ht 64.0 in | Wt 172.0 lb

## 2012-02-06 DIAGNOSIS — R0600 Dyspnea, unspecified: Secondary | ICD-10-CM

## 2012-02-06 DIAGNOSIS — R0609 Other forms of dyspnea: Secondary | ICD-10-CM

## 2012-02-06 DIAGNOSIS — J4489 Other specified chronic obstructive pulmonary disease: Secondary | ICD-10-CM

## 2012-02-06 DIAGNOSIS — J449 Chronic obstructive pulmonary disease, unspecified: Secondary | ICD-10-CM

## 2012-02-06 DIAGNOSIS — R0989 Other specified symptoms and signs involving the circulatory and respiratory systems: Secondary | ICD-10-CM

## 2012-02-06 DIAGNOSIS — J479 Bronchiectasis, uncomplicated: Secondary | ICD-10-CM

## 2012-02-06 NOTE — Progress Notes (Signed)
  Subjective:    Patient ID: [name], female    DOB: 11/08/1917, 79 y.o.   MRN: [id number]  HPI Patient comes in today for followup of her known bronchiectasis with secondary COPD.  She is maintained on arcapta, and feels this has helped her breathing.  Last month she developed what sounds like a viral or bacterial upper respiratory infection which led to a COPD exacerbation.  She was only able to take prednisone for a few days, but feels this helped her breathing considerably.  She feels that she is now back to her baseline.  Her only complaint is that of postnasal drip with cough first thing in the morning.   Review of Systems  Constitutional: Negative for fever and unexpected weight change.  HENT: Positive for congestion ( mostly in AM). Negative for ear pain, nosebleeds, sore throat, rhinorrhea, sneezing, trouble swallowing, dental problem, postnasal drip and sinus pressure.   Eyes: Negative for redness and itching.  Respiratory: Positive for cough ( clear production) and shortness of breath. Negative for chest tightness and wheezing.   Cardiovascular: Positive for leg swelling ( left leg). Negative for palpitations.  Gastrointestinal: Negative for nausea and vomiting.  Genitourinary: Negative for dysuria.  Musculoskeletal: Negative for joint swelling.  Skin: Negative for rash.  Neurological: Negative for headaches.  Hematological: Does not bruise/bleed easily.  Psychiatric/Behavioral: Negative for dysphoric mood. The patient is not nervous/anxious.        Objective:   Physical Exam Overweight female in no acute distress Nose without purulence or discharge noted Neck without lymphadenopathy or thyromegaly Chest with a few basilar crackles, no wheezes or rhonchi Cardiac exam with regular rate and rhythm Lower extremities with minimal ankle edema, no cyanosis Alert and oriented, moves all 4 extremities.       Assessment & Plan:

## 2012-02-06 NOTE — Assessment & Plan Note (Signed)
I suspect the patient had a mild COPD exacerbation last month that was probably related to a viral or bacterial bronchitis.  She only took prednisone for a few days, and discontinued because of hyperglycemia.  She now feels that she is back to her usual baseline.  I have asked her to continue on her LABA.

## 2012-02-06 NOTE — Assessment & Plan Note (Signed)
The patient has not had a recent true acute exacerbation of her bronchiectasis.  Will monitor her closely.

## 2012-02-06 NOTE — Assessment & Plan Note (Signed)
The patient has dyspnea on exertion that is partially related to her bronchiectasis and concomitant COPD, but she also clearly has muscle weakness and deconditioning.  I have asked her to work on strengthening by walking in the halls.

## 2012-02-06 NOTE — Patient Instructions (Addendum)
Continue on arcapta once each am. Can try taking an antihistamine at bedtime to help with your postnasal drip.  Try zyrtec 10mg  otc at bedtime. Cancel appointment for this time next year, and schedule a followup with me in 6mos. Stay as active as possible.

## 2012-03-05 ENCOUNTER — Other Ambulatory Visit: Payer: Self-pay | Admitting: Pulmonary Disease

## 2012-03-12 ENCOUNTER — Telehealth: Payer: Self-pay | Admitting: Pulmonary Disease

## 2012-03-12 MED ORDER — INDACATEROL MALEATE 75 MCG IN CAPS: 1.0000 | ORAL_CAPSULE | Freq: Every day | RESPIRATORY_TRACT | Status: DC

## 2012-03-12 NOTE — Telephone Encounter (Signed)
Pt called back again re: same. Kathleen W Perdue  °

## 2012-05-03 ENCOUNTER — Other Ambulatory Visit: Payer: Self-pay | Admitting: Internal Medicine

## 2012-05-12 ENCOUNTER — Other Ambulatory Visit: Payer: Self-pay | Admitting: *Deleted

## 2012-06-23 ENCOUNTER — Other Ambulatory Visit (HOSPITAL_BASED_OUTPATIENT_CLINIC_OR_DEPARTMENT_OTHER): Payer: Self-pay | Admitting: Internal Medicine

## 2012-06-25 ENCOUNTER — Other Ambulatory Visit: Payer: Self-pay | Admitting: Internal Medicine

## 2012-07-20 ENCOUNTER — Encounter: Payer: Self-pay | Admitting: *Deleted

## 2012-07-27 ENCOUNTER — Non-Acute Institutional Stay: Payer: Medicare Other | Admitting: Internal Medicine

## 2012-07-27 ENCOUNTER — Encounter: Payer: Self-pay | Admitting: Internal Medicine

## 2012-07-27 VITALS — BP 128/62 | HR 62 | Ht 64.0 in | Wt 174.0 lb

## 2012-07-27 DIAGNOSIS — I10 Essential (primary) hypertension: Secondary | ICD-10-CM | POA: Insufficient documentation

## 2012-07-27 DIAGNOSIS — M25519 Pain in unspecified shoulder: Secondary | ICD-10-CM | POA: Insufficient documentation

## 2012-07-27 DIAGNOSIS — J411 Mucopurulent chronic bronchitis: Secondary | ICD-10-CM | POA: Insufficient documentation

## 2012-07-27 DIAGNOSIS — M25569 Pain in unspecified knee: Secondary | ICD-10-CM | POA: Insufficient documentation

## 2012-07-27 DIAGNOSIS — E119 Type 2 diabetes mellitus without complications: Secondary | ICD-10-CM

## 2012-07-27 DIAGNOSIS — R0600 Dyspnea, unspecified: Secondary | ICD-10-CM

## 2012-07-27 DIAGNOSIS — M1990 Unspecified osteoarthritis, unspecified site: Secondary | ICD-10-CM | POA: Insufficient documentation

## 2012-07-27 DIAGNOSIS — M129 Arthropathy, unspecified: Secondary | ICD-10-CM

## 2012-07-27 DIAGNOSIS — C44729 Squamous cell carcinoma of skin of left lower limb, including hip: Secondary | ICD-10-CM

## 2012-07-27 DIAGNOSIS — R5381 Other malaise: Secondary | ICD-10-CM | POA: Insufficient documentation

## 2012-07-27 DIAGNOSIS — M25561 Pain in right knee: Secondary | ICD-10-CM

## 2012-07-27 DIAGNOSIS — R0989 Other specified symptoms and signs involving the circulatory and respiratory systems: Secondary | ICD-10-CM

## 2012-07-27 DIAGNOSIS — M25512 Pain in left shoulder: Secondary | ICD-10-CM

## 2012-07-27 DIAGNOSIS — C44721 Squamous cell carcinoma of skin of unspecified lower limb, including hip: Secondary | ICD-10-CM | POA: Insufficient documentation

## 2012-07-27 DIAGNOSIS — R5383 Other fatigue: Secondary | ICD-10-CM | POA: Insufficient documentation

## 2012-07-27 HISTORY — DX: Squamous cell carcinoma of skin of unspecified lower limb, including hip: C44.721

## 2012-07-27 NOTE — Progress Notes (Signed)
Subjective:    Patient ID: [name], female    DOB: 1917-09-02, 79 y.o.   MRN: [id number]  HPI Type II or unspecified type diabetes mellitus without mention of complication, not stated as uncontrolled; stable  Hypertension: controlled  Arthritis: unchanged  Other malaise and fatigue: improved  Mucopurulent chronic bronchitis: continues with cough  Pain in joint, lower leg, right: persistent, chronic, unchanged. Has seen Dr. Ophelia Charter,  Squamous cell carcinoma of skin of lower limb, including hip, left: to be removed  Pain in joint, shoulder region, left: chronic and unchanged. Also hurts in the left arm triceps area. Painful to lift the arm. Using Aspercreme for relief.  DOE (dyspnea on exertion): unchanged  Current Outpatient Prescriptions on File Prior to Visit  Medication Sig Dispense Refill  . acetaminophen (TYLENOL) 650 MG CR tablet Take 1,300 mg by mouth 2 (two) times daily as needed.       Marland Kitchen amLODipine (NORVASC) 5 MG tablet TAKE 1 TABLET BY MOUTH EVERY DAY  90 tablet  3  . BAYER CONTOUR TEST test strip CHECK GLUCOSE DAILY  100 each  4  . BYSTOLIC 20 MG TABS TAKE 1 TABLET EVERY DAY  90 tablet  2  . calcium carbonate (TUMS EX) 750 MG chewable tablet Chew 1 tablet by mouth as needed.      . cloNIDine (CATAPRES) 0.1 MG tablet Take 0.1 mg by mouth as needed. Take 1 tablet for blood pressure greater than 160/100. May repeat in 1 hour if still greater than 160/100.      . furosemide (LASIX) 40 MG tablet TAKE 1 TABLET EVERY DAY TO CONTROL EDEMA  90 tablet  3  . glipiZIDE (GLUCOTROL) 5 MG tablet Take 10 mg by mouth 2 (two) times daily. Take 2 tablets before breakfast and two tablets before supper to control diabetes.      . Indacaterol Maleate (ARCAPTA NEOHALER) 75 MCG CAPS Place 1 puff into inhaler and inhale daily.  30 capsule  3  . metFORMIN (GLUCOPHAGE) 500 MG tablet TAKE 2 TABLETS EVERY MORNING AND 2 TABLETS AT SUPPER TO CONTROL BLOOD SUGAR  360 tablet  1  . Multiple Vitamin  (MULTIVITAMIN) capsule Take 1 capsule by mouth daily.      . Multiple Vitamins-Minerals (EYE VITAMINS PO) Take by mouth daily. Take 1 tablet for eye health      . omeprazole (PRILOSEC) 40 MG capsule Take 40 mg by mouth daily. Take 1 tablet daily for acid reduction.      . Simethicone (GAS RELIEF 80 PO) Take by mouth daily.      . simvastatin (ZOCOR) 20 MG tablet TAKE 1 TABLET EVERY DAY TO CONTROL LIPIDS  90 tablet  4  . temazepam (RESTORIL) 15 MG capsule Take 15 mg by mouth at bedtime as needed.       No current facility-administered medications on file prior to visit.      Review of Systems  Constitutional: Positive for activity change and fatigue. Negative for fever and unexpected weight change.  HENT: Positive for hearing loss, neck pain and neck stiffness.   Eyes: Negative.   Respiratory: Positive for cough and shortness of breath.   Cardiovascular: Positive for leg swelling. Negative for chest pain and palpitations.  Gastrointestinal: Negative.   Endocrine: Negative.        Diabetic.  Genitourinary:       Urinary leakage. History of infections.  Musculoskeletal:       Unsteady gait. Joint pains.  Skin:  New lesion of the left leg medial calf.  Neurological:       History of numbness and headaches. Unsteady gait.  Hematological: Negative.   Psychiatric/Behavioral:       Awakens in the middle of the night. Gwenith Daily trouble falling asleep.       Objective: BP 128/62  Pulse 62  Ht 5\' 4"  (1.626 m)  Wt 174 lb (78.926 kg)  BMI 29.85 kg/m2     Physical Exam  Constitutional: She is oriented to person, place, and time. She appears well-developed and well-nourished. No distress.  HENT:  Head: Normocephalic and atraumatic.  Right Ear: External ear normal.  Left Ear: External ear normal.  Nose: Nose normal.  Eyes: Conjunctivae and EOM are normal. Pupils are equal, round, and reactive to light.  Neck: Neck supple. No JVD present. No tracheal deviation present. No thyromegaly  present.  Cardiovascular: Normal rate, regular rhythm, normal heart sounds and intact distal pulses.  Exam reveals no gallop and no friction rub.   No murmur heard. Pulmonary/Chest: Breath sounds normal. No respiratory distress. She has no wheezes. She has no rales.  Abdominal: Bowel sounds are normal. She exhibits no distension and no mass. There is no tenderness.  Musculoskeletal: She exhibits edema.  Lymphadenopathy:    She has no cervical adenopathy.  Neurological: She is alert and oriented to person, place, and time. She has normal reflexes. No cranial nerve deficit. Coordination normal.  Skin: No rash noted. No erythema. No pallor.  Likely SCC of the left medial calf  Psychiatric: She has a normal mood and affect. Her behavior is normal. Judgment and thought content normal.     LAB REVIEW 07/15/12 BMP: glu 103  A1c 6.9     Assessment & Plan:  Type II or unspecified type diabetes mellitus without mention of complication, not stated as uncontrolled: controlled adequately  Hypertension; controlled  Arthritis: continue current meds and Aspeercreme  Other malaise and fatigue: improved  Mucopurulent chronic bronchitis: continues with cough  Pain in joint, lower leg, right: unsure if this is sciatica  Squamous cell carcinoma of skin of lower limb, including hip, left: refer to dermatology for removal.  Pain in joint, shoulder region, left: continue with Dr, Ophelia Charter  DOE (dyspnea on exertion): unchanged

## 2012-08-03 ENCOUNTER — Telehealth: Payer: Self-pay

## 2012-08-03 NOTE — Telephone Encounter (Signed)
Called patient about appt with Dr. Bufford Buttner at [facility] 09/07/12 at 10:30  [address]  [phone]. Kaylyn Layer, CMA

## 2012-08-10 ENCOUNTER — Encounter: Payer: Self-pay | Admitting: Pulmonary Disease

## 2012-08-10 ENCOUNTER — Ambulatory Visit (INDEPENDENT_AMBULATORY_CARE_PROVIDER_SITE_OTHER): Payer: Medicare Other | Admitting: Pulmonary Disease

## 2012-08-10 VITALS — BP 130/50 | HR 65 | Temp 97.5°F | Ht 62.5 in | Wt 177.8 lb

## 2012-08-10 DIAGNOSIS — J449 Chronic obstructive pulmonary disease, unspecified: Secondary | ICD-10-CM

## 2012-08-10 NOTE — Progress Notes (Signed)
  Subjective:    Patient ID: [name], female    DOB: 02/11/1917, 79 y.o.   MRN: [id number]  HPI The patient comes in today for followup of her COPD.  She was treated with arcapta, and feels that it has helped her breathing to some degree.  It has certainly helped her cough which has resolved.  Despite this, she continues to have significant dyspnea on exertion, but she admits that she is very unsteady on her feet with deconditioned lower extremities.  She has recently started working with physical therapy, and is scheduled to do this 2 times a week for the next month.   Review of Systems  Constitutional: Negative for fever and unexpected weight change.  HENT: Negative for ear pain, nosebleeds, congestion, sore throat, rhinorrhea, sneezing, trouble swallowing, dental problem, postnasal drip and sinus pressure.   Eyes: Negative for redness and itching.  Respiratory: Negative for cough, chest tightness, shortness of breath and wheezing.   Cardiovascular: Negative for palpitations and leg swelling.  Gastrointestinal: Negative for nausea and vomiting.  Genitourinary: Negative for dysuria.  Musculoskeletal: Negative for joint swelling.  Skin: Negative for rash.  Neurological: Negative for headaches.  Hematological: Does not bruise/bleed easily.  Psychiatric/Behavioral: Negative for dysphoric mood. The patient is not nervous/anxious.        Objective:   Physical Exam Obese female in no acute distress Nose without purulence or discharge noted Neck without lymphadenopathy or thyromegaly Chest with clear breath sounds bilaterally, no wheezing Cardiac exam with regular rate and rhythm Lower extremities with mild edema, no cyanosis Alert and oriented, moves all 4 extremities.       Assessment & Plan:

## 2012-08-10 NOTE — Patient Instructions (Addendum)
Stay on arcapta once a day for now. Participate with PT for the next 3-4 weeks, and if you feel you are not making any progress, let us know and we will try the next step up in treatment. followup with me in 6mos.

## 2012-08-10 NOTE — Assessment & Plan Note (Signed)
The patient has mild to moderate airflow obstruction on PFTs, and has seen some improvement with arcapta.  She continues to have dyspnea on exertion, and from her history this may be all related to deconditioning and balance issues.  I have discussed with her the possibility of treating her more aggressively from a bronchodilator standpoint, but we have decided to hold off until she can work with a physical therapist over the next 4 weeks.  If she continues to have significant issues that are negatively impacting her quality of life, with change in her arcapta to anoro as a trial.

## 2012-08-14 ENCOUNTER — Other Ambulatory Visit: Payer: Self-pay | Admitting: Pulmonary Disease

## 2012-08-30 ENCOUNTER — Other Ambulatory Visit: Payer: Self-pay | Admitting: Pulmonary Disease

## 2012-08-30 ENCOUNTER — Other Ambulatory Visit: Payer: Self-pay | Admitting: Internal Medicine

## 2012-09-03 ENCOUNTER — Other Ambulatory Visit: Payer: Self-pay | Admitting: Geriatric Medicine

## 2012-09-03 MED ORDER — TEMAZEPAM 15 MG PO CAPS: 15.0000 mg | ORAL_CAPSULE | Freq: Every evening | ORAL | Status: DC | PRN

## 2012-09-20 NOTE — Patient Instructions (Signed)
Continue current medication.

## 2012-11-12 ENCOUNTER — Ambulatory Visit: Payer: Medicare Other | Admitting: Pulmonary Disease

## 2012-11-18 DIAGNOSIS — D62 Acute posthemorrhagic anemia: Secondary | ICD-10-CM | POA: Insufficient documentation

## 2012-11-23 ENCOUNTER — Non-Acute Institutional Stay: Payer: Medicare Other | Admitting: Internal Medicine

## 2012-11-23 ENCOUNTER — Encounter: Payer: Self-pay | Admitting: Internal Medicine

## 2012-11-23 VITALS — BP 140/60 | HR 68 | Temp 96.0°F | Ht 62.5 in | Wt 178.0 lb

## 2012-11-23 DIAGNOSIS — K219 Gastro-esophageal reflux disease without esophagitis: Secondary | ICD-10-CM

## 2012-11-23 DIAGNOSIS — M25569 Pain in unspecified knee: Secondary | ICD-10-CM

## 2012-11-23 DIAGNOSIS — E119 Type 2 diabetes mellitus without complications: Secondary | ICD-10-CM

## 2012-11-23 DIAGNOSIS — L738 Other specified follicular disorders: Secondary | ICD-10-CM

## 2012-11-23 DIAGNOSIS — G4700 Insomnia, unspecified: Secondary | ICD-10-CM

## 2012-11-23 DIAGNOSIS — J449 Chronic obstructive pulmonary disease, unspecified: Secondary | ICD-10-CM

## 2012-11-23 DIAGNOSIS — M129 Arthropathy, unspecified: Secondary | ICD-10-CM

## 2012-11-23 DIAGNOSIS — M1990 Unspecified osteoarthritis, unspecified site: Secondary | ICD-10-CM

## 2012-11-23 DIAGNOSIS — M25561 Pain in right knee: Secondary | ICD-10-CM

## 2012-11-23 DIAGNOSIS — K254 Chronic or unspecified gastric ulcer with hemorrhage: Secondary | ICD-10-CM | POA: Insufficient documentation

## 2012-11-23 DIAGNOSIS — I10 Essential (primary) hypertension: Secondary | ICD-10-CM

## 2012-11-23 DIAGNOSIS — L739 Follicular disorder, unspecified: Secondary | ICD-10-CM | POA: Insufficient documentation

## 2012-11-23 NOTE — Progress Notes (Signed)
Subjective:    Patient ID: [name], female    DOB: 10-12-1917, 79 y.o.   MRN: [id number]  Chief Complaint  Patient presents with  . Medical Managment of Chronic Issues    Comprehensive exam: blood pressure, blood sugar, arthritis. Dr. Ophelia Charter did injection of (R) knee in Sept.     HPI Hypertension: controlled  COPD (chronic obstructive pulmonary disease)  Arthritis: generalized  Type II or unspecified type diabetes mellitus without mention of complication, not stated as uncontrolled  Folliculitis: scattered, mild  Pain in joint, lower leg, right: Dr Ophelia Charter gave injection in the right knee in Sept. 2014. Which helped the pains. Also had the ray, which she is not sure helped any.  Anemia: hgb 10.3. With MCV 77.  Immunization History  Administered Date(s) Administered  . Influenza Split 11/06/2011, 11/04/2012  . Pneumococcal Polysaccharide 02/04/2003  . Zoster 02/03/2006   Allergies  Allergen Reactions  . Adhesive [Tape]   . Aspirin   . Codeine   . Enalapril Cough  . Pantoprazole Sodium      Current Outpatient Prescriptions on File Prior to Visit  Medication Sig Dispense Refill  . acetaminophen (TYLENOL) 650 MG CR tablet Take 1,300 mg by mouth 2 (two) times daily as needed.       Marland Kitchen amLODipine (NORVASC) 5 MG tablet TAKE 1 TABLET BY MOUTH EVERY DAY  90 tablet  3  . ARCAPTA NEOHALER 75 MCG CAPS PLACE 1 PUFF INTO INHALER AND INHALE DAILY.  30 capsule  5  . BAYER CONTOUR TEST test strip CHECK GLUCOSE DAILY  100 each  4  . BYSTOLIC 20 MG TABS TAKE 1 TABLET EVERY DAY  90 tablet  2  . calcium carbonate (TUMS EX) 750 MG chewable tablet Chew 1 tablet by mouth as needed.      . cloNIDine (CATAPRES) 0.1 MG tablet Take 0.1 mg by mouth as needed. Take 1 tablet for blood pressure greater than 160/100. May repeat in 1 hour if still greater than 160/100.      . furosemide (LASIX) 40 MG tablet TAKE 1 TABLET EVERY DAY TO CONTROL EDEMA  90 tablet  3  . glipiZIDE (GLUCOTROL) 5 MG  tablet Take 10 mg by mouth 2 (two) times daily. Take 2 tablets before breakfast and two tablets before supper to control diabetes.      . metFORMIN (GLUCOPHAGE) 500 MG tablet TAKE 2 TABLETS EVERY MORNING AND 2 TABLETS AT SUPPER TO CONTROL BLOOD SUGAR  360 tablet  1  . Multiple Vitamin (MULTIVITAMIN) capsule Take 1 capsule by mouth daily.      . Multiple Vitamins-Minerals (EYE VITAMINS PO) Take by mouth daily. Take 1 tablet for eye health      . omeprazole (PRILOSEC) 40 MG capsule Take 40 mg by mouth daily. Take 1 tablet daily for acid reduction.      . Simethicone (GAS RELIEF 80 PO) Take by mouth daily.      . simvastatin (ZOCOR) 20 MG tablet TAKE 1 TABLET EVERY DAY TO CONTROL LIPIDS  90 tablet  4  . temazepam (RESTORIL) 15 MG capsule Take 1 capsule (15 mg total) by mouth at bedtime as needed.  30 capsule  3   No current facility-administered medications on file prior to visit.   Past Medical History  Diagnosis Date  . Type II or unspecified type diabetes mellitus without mention of complication, not stated as uncontrolled   . Hypertension   . Arthritis   . Ulcer   .  COPD (chronic obstructive pulmonary disease)   . Regional enteritis of unspecified site   . Other malaise and fatigue   . Anemia, unspecified   . Other and unspecified hyperlipidemia   . Hypoxemia   . Pneumonia, organism unspecified   . Mucopurulent chronic bronchitis   . Palpitations   . Nontoxic uninodular goiter   . Abnormality of gait   . Fall from other slipping, tripping, or stumbling   . Cholelithiases   . Closed fracture of unspecified part of ramus of mandible   . Disturbance of skin sensation   . Sciatica   . Tension headache   . Coronary atherosclerosis of unspecified type of vessel, native or graft   . Osteoarthrosis, unspecified whether generalized or localized, unspecified site   . Insomnia, unspecified   . Edema   . Tension headache   . Pain in joint, lower leg 2010    right knee  . Unspecified  venous (peripheral) insufficiency   . Esophageal reflux   . Rosacea   . Cataract 1990    Dr. Dione Booze  . Pain in joint, shoulder region 2013    left  . Gastric ulcer with hemorrhage 2008   Past Surgical History  Procedure Laterality Date  . Total knee arthroplasty Bilateral 1993, 1998    knees  . Other surgical history  2008    Ulcer surgery   . Small intestine surgery    . Eye surgery Bilateral 1990    Dr. Dione Booze  . Gastrojejunostomy  05/2004    secondary to ulcer  . Joint replacement Bilateral 1993-1998    total knee replacement   PAST PROCEDURES 1998 Colonoscopy: polyps removed 2000 Bone density: normal 2004 Abd [place]; nl 2004 colonoscopy and EGD: ulcerations in stomach, mostly in the antrum. Esophagitis. Reflux 2006 EGD: huge duodenal ulcer with large visible vessel 2006 MRI LS; lumbar spondylosis 2007 Cardioloite: normal EF. No ischemia 08/25/07 CXR: nl 2009 mammogram; normal 09/13/08 CT pelvis; comminuted fx of the left superior pubic ramus.Incidental finding of cholelithiasis and recto sigmoid diverticulosis 10/16/09 CXR; possible nodule in the right lung base and increased nodularity of the right lung apex. 10/17/09 CT chest: patchy peribronchial nodularity and mild bronchiectasis Aug 2012 2D Echo; LVEF 60 %.Grade I diastolic dysfunction. Mild MR. Trivial pericardial effusion. 04/10/11 CXR: normal 04/23/11 PFT: minimal airway obstruction and overinflation. Unable to complete DLCO test due to coughing spasm.   Family Status  Relation Status Death Age  . Father Deceased 13    CAD, Stomach ulcers  . Sister Deceased     cervical and colon cancer  . Brother Alive   . Daughter Alive     Mental retardation, encephalitis  . Mother Deceased 75    History of TIA'S  . Son Alive    Family History  Problem Relation Age of Onset  . Heart disease Father   . Cancer Sister   . Diabetes Brother   . Obesity Brother   . Mental retardation Daughter   . Obesity Daughter    History    Social History  . Marital Status: Widowed    Spouse Name: N/A    Number of Children: N/A  . Years of Education: N/A   Occupational History  . retired Futures trader.     Social History Main Topics  . Smoking status: Former Smoker -- 0.30 packs/day for 10 years    Types: Cigarettes    Quit date: 02/03/1973  . Smokeless tobacco: Never Used     Comment: former  casual smoker  . Alcohol Use: No  . Drug Use: No  . Sexual Activity: No   Other Topics Concern  . None   Social History Narrative   Pt lives at [facility] in the independent living section since 1994.   Pt lives alone.   Has a living will.          Review of Systems  Constitutional: Positive for activity change and fatigue. Negative for fever and unexpected weight change.  HENT: Positive for hearing loss (bilateral hearing aids).   Eyes: Positive for visual disturbance (corrective lenses).  Respiratory: Positive for cough and shortness of breath.   Cardiovascular: Positive for leg swelling. Negative for chest pain and palpitations.  Gastrointestinal: Negative.   Endocrine: Negative.        Diabetic.  Genitourinary:       Urinary leakage. History of infections.  Musculoskeletal: Positive for neck pain and neck stiffness.       Unsteady gait. Joint pains.  Skin:       New lesion of the left leg medial calf.  Neurological:       History of numbness and headaches. Unsteady gait.  Hematological: Negative.   Psychiatric/Behavioral:       Awakens in the middle of the night. Gwenith Daily trouble falling asleep.       Objective:   Physical Exam  Constitutional: She is oriented to person, place, and time. She appears well-developed and well-nourished. No distress.  HENT:  Head: Normocephalic and atraumatic.  Right Ear: External ear normal.  Left Ear: External ear normal.  Nose: Nose normal.  Eyes: Conjunctivae and EOM are normal. Pupils are equal, round, and reactive to light.  Neck: Neck supple. No JVD present.  No tracheal deviation present. No thyromegaly present.  Cardiovascular: Normal rate, regular rhythm, normal heart sounds and intact distal pulses.  Exam reveals no gallop and no friction rub.   No murmur heard. Pulmonary/Chest: Breath sounds normal. No respiratory distress. She has no wheezes. She has no rales.  Abdominal: Bowel sounds are normal. She exhibits no distension and no mass. There is no tenderness.  Genitourinary: Guaiac negative stool.  Vaginal atrophy. Narrowing of introitus.  Musculoskeletal: She exhibits edema (1+ bipedal).  Scars from bilateral TKR.  Lymphadenopathy:    She has no cervical adenopathy.  Neurological: She is alert and oriented to person, place, and time. She has normal reflexes. No cranial nerve deficit. Coordination normal.  Diminished vibratory in both feet.  Skin: No rash noted. No erythema. No pallor.  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 Has tender scar, but no obvious reoccurrence.  Psychiatric: She has a normal mood and affect. Her behavior is normal. Judgment and thought content normal.      LAB REVIEW 11/10/11 Hgb 10.4, MCV 81 11/18/12 CBC: Hgb 10.3, MCV 77.2  CMP: nl  TSH 2.736  A1c 7.2 11/23/12 EKG; rate 63. NSR. Normal    Assessment & Plan:  Hypertension: controlled  COPD (chronic obstructive pulmonary disease): improved. Cough is better.  Arthritis: generalized  Type II or unspecified type diabetes mellitus without mention of complication, not stated as uncontrolled: ; mild elevation in A1c. Continue current medication.  Folliculitis: use anti-itch creams  Pain in joint, lower leg, right: improved  Esophageal reflux; improved  Insomnia, unspecified; using temazepam  Gastric ulcer with hemorrhage: 2008. No obvious bleeding

## 2012-11-23 NOTE — Patient Instructions (Signed)
Continue current medications. 

## 2012-12-07 ENCOUNTER — Encounter: Payer: Self-pay | Admitting: Internal Medicine

## 2012-12-21 ENCOUNTER — Other Ambulatory Visit: Payer: Self-pay | Admitting: Internal Medicine

## 2012-12-21 ENCOUNTER — Other Ambulatory Visit: Payer: Self-pay | Admitting: Pulmonary Disease

## 2012-12-28 ENCOUNTER — Other Ambulatory Visit: Payer: Self-pay | Admitting: Internal Medicine

## 2013-01-02 ENCOUNTER — Other Ambulatory Visit: Payer: Self-pay | Admitting: Adult Health

## 2013-01-03 ENCOUNTER — Encounter: Payer: Self-pay | Admitting: *Deleted

## 2013-01-05 ENCOUNTER — Other Ambulatory Visit: Payer: Self-pay | Admitting: Adult Health

## 2013-01-18 ENCOUNTER — Telehealth: Payer: Self-pay

## 2013-01-18 ENCOUNTER — Other Ambulatory Visit: Payer: Self-pay

## 2013-01-18 MED ORDER — DOXEPIN HCL 3 MG PO TABS: ORAL_TABLET | ORAL | Status: DC

## 2013-01-18 MED ORDER — SALMETEROL XINAFOATE 50 MCG/DOSE IN AEPB: INHALATION_SPRAY | RESPIRATORY_TRACT | Status: DC

## 2013-01-18 NOTE — Telephone Encounter (Signed)
Received a notice from [facility] that medications were not covered on formulary:  Arcapta 46m not covered was switched to [employer] Diskus inhale once every 12 hours to help breathing Disp 3 Diskus 4 refills  Temazepam 15mg  not covered was switched to Silenor 3 mg #90 one nightly to help sleep 4 refills.   Dr. Chilton Si wrote Rx's, was left with [facility] clinic nurse. Called patient that we did this.

## 2013-02-02 ENCOUNTER — Other Ambulatory Visit: Payer: Self-pay | Admitting: Internal Medicine

## 2013-02-08 ENCOUNTER — Ambulatory Visit (INDEPENDENT_AMBULATORY_CARE_PROVIDER_SITE_OTHER): Payer: Medicare Other | Admitting: Pulmonary Disease

## 2013-02-08 ENCOUNTER — Encounter: Payer: Self-pay | Admitting: Pulmonary Disease

## 2013-02-08 VITALS — BP 124/62 | HR 66 | Temp 97.3°F | Ht 64.0 in | Wt 176.0 lb

## 2013-02-08 DIAGNOSIS — J449 Chronic obstructive pulmonary disease, unspecified: Secondary | ICD-10-CM

## 2013-02-08 DIAGNOSIS — J479 Bronchiectasis, uncomplicated: Secondary | ICD-10-CM

## 2013-02-08 NOTE — Assessment & Plan Note (Signed)
The patient has not had any significant chest congestion or purulent since the last visit.

## 2013-02-08 NOTE — Patient Instructions (Signed)
Do not fill your serevent. Start striverdi, 2 inhalations each am.  Rinse mouth afterward.  Use card to get 3 mos supply for free.  If this card does not work, let us know.  Check with your insurance plan to see if this is covered.  Stay as active as possible. followup with me in 85mos, but call if having breathing issues.

## 2013-02-08 NOTE — Assessment & Plan Note (Signed)
The patient has been doing very well on arcapta, but this is no longer covered on her formulary plan.  She has been given a prescription for 0, but I explained that this has a very delayed onset, and therefore is not the equivalent of other LABA's. I will give her samples of striverdi, and also a card for more samples.  She will check to see if this is covered by her insurance. I have reminded her to stay very active, and to keep her conditioning up.

## 2013-02-08 NOTE — Progress Notes (Signed)
   Subjective:    Patient ID: [name], female    DOB: 1917-11-20, 80 y.o.   MRN: [id number]  HPI Patient comes in today for followup of her known bronchiectasis with underlying COPD. She has been maintained on arcapta with a good response, but unfortunately her insurance company no longer covers the medication. She feels that her breathing has been fairly stable since the last visit, and has not had a flare of her bronchiectasis. She has had minimal cough and no purulent mucus. Her cough occurred primarily in the morning, and then resolves for the rest of the day.   Review of Systems  Constitutional: Negative for fever and unexpected weight change.  HENT: Negative for congestion, dental problem, ear pain, nosebleeds, postnasal drip, rhinorrhea, sinus pressure, sneezing, sore throat and trouble swallowing.   Eyes: Negative for redness and itching.  Respiratory: Positive for shortness of breath. Negative for cough, chest tightness and wheezing.   Cardiovascular: Negative for palpitations and leg swelling.  Gastrointestinal: Negative for nausea and vomiting.  Genitourinary: Negative for dysuria.  Musculoskeletal: Negative for joint swelling.  Skin: Negative for rash.  Neurological: Negative for headaches.  Hematological: Does not bruise/bleed easily.  Psychiatric/Behavioral: Negative for dysphoric mood. The patient is not nervous/anxious.        Objective:   Physical Exam Overweight female in no acute distress Nose without purulence or discharge noted Neck without lymphadenopathy or thyromegaly Chest with decreased breath sounds, no wheezes or crackles Cardiac exam with regular rate and rhythm Lower extremities with mild edema, no cyanosis Alert and oriented, moves all 4 extremities.       Assessment & Plan:

## 2013-02-27 ENCOUNTER — Emergency Department (HOSPITAL_COMMUNITY): Payer: Medicare Other

## 2013-02-27 ENCOUNTER — Emergency Department (HOSPITAL_COMMUNITY)
Admission: EM | Admit: 2013-02-27 | Discharge: 2013-02-27 | Disposition: A | Discharge status: Home / Self Care | Payer: Medicare Other | Attending: Emergency Medicine | Admitting: Emergency Medicine

## 2013-02-27 ENCOUNTER — Encounter (HOSPITAL_COMMUNITY): Payer: Self-pay | Admitting: Emergency Medicine

## 2013-02-27 DIAGNOSIS — Z872 Personal history of diseases of the skin and subcutaneous tissue: Secondary | ICD-10-CM | POA: Insufficient documentation

## 2013-02-27 DIAGNOSIS — S1093XA Contusion of unspecified part of neck, initial encounter: Secondary | ICD-10-CM

## 2013-02-27 DIAGNOSIS — Y9210 Unspecified residential institution as the place of occurrence of the external cause: Secondary | ICD-10-CM | POA: Insufficient documentation

## 2013-02-27 DIAGNOSIS — M1990 Unspecified osteoarthritis, unspecified site: Secondary | ICD-10-CM | POA: Insufficient documentation

## 2013-02-27 DIAGNOSIS — S298XXA Other specified injuries of thorax, initial encounter: Secondary | ICD-10-CM | POA: Insufficient documentation

## 2013-02-27 DIAGNOSIS — Z8669 Personal history of other diseases of the nervous system and sense organs: Secondary | ICD-10-CM | POA: Insufficient documentation

## 2013-02-27 DIAGNOSIS — S0003XA Contusion of scalp, initial encounter: Secondary | ICD-10-CM | POA: Insufficient documentation

## 2013-02-27 DIAGNOSIS — Z79899 Other long term (current) drug therapy: Secondary | ICD-10-CM | POA: Insufficient documentation

## 2013-02-27 DIAGNOSIS — Z862 Personal history of diseases of the blood and blood-forming organs and certain disorders involving the immune mechanism: Secondary | ICD-10-CM | POA: Insufficient documentation

## 2013-02-27 DIAGNOSIS — J4489 Other specified chronic obstructive pulmonary disease: Secondary | ICD-10-CM | POA: Insufficient documentation

## 2013-02-27 DIAGNOSIS — G4700 Insomnia, unspecified: Secondary | ICD-10-CM | POA: Insufficient documentation

## 2013-02-27 DIAGNOSIS — S12100A Unspecified displaced fracture of second cervical vertebra, initial encounter for closed fracture: Secondary | ICD-10-CM | POA: Insufficient documentation

## 2013-02-27 DIAGNOSIS — S0083XA Contusion of other part of head, initial encounter: Secondary | ICD-10-CM | POA: Insufficient documentation

## 2013-02-27 DIAGNOSIS — X500XXA Overexertion from strenuous movement or load, initial encounter: Secondary | ICD-10-CM | POA: Insufficient documentation

## 2013-02-27 DIAGNOSIS — Z87891 Personal history of nicotine dependence: Secondary | ICD-10-CM | POA: Insufficient documentation

## 2013-02-27 DIAGNOSIS — Z9889 Other specified postprocedural states: Secondary | ICD-10-CM | POA: Insufficient documentation

## 2013-02-27 DIAGNOSIS — I10 Essential (primary) hypertension: Secondary | ICD-10-CM | POA: Insufficient documentation

## 2013-02-27 DIAGNOSIS — J449 Chronic obstructive pulmonary disease, unspecified: Secondary | ICD-10-CM | POA: Insufficient documentation

## 2013-02-27 DIAGNOSIS — I2510 Atherosclerotic heart disease of native coronary artery without angina pectoris: Secondary | ICD-10-CM | POA: Insufficient documentation

## 2013-02-27 DIAGNOSIS — Z8701 Personal history of pneumonia (recurrent): Secondary | ICD-10-CM | POA: Insufficient documentation

## 2013-02-27 DIAGNOSIS — Z23 Encounter for immunization: Secondary | ICD-10-CM | POA: Insufficient documentation

## 2013-02-27 DIAGNOSIS — W19XXXA Unspecified fall, initial encounter: Secondary | ICD-10-CM

## 2013-02-27 DIAGNOSIS — Y939 Activity, unspecified: Secondary | ICD-10-CM | POA: Insufficient documentation

## 2013-02-27 DIAGNOSIS — K219 Gastro-esophageal reflux disease without esophagitis: Secondary | ICD-10-CM | POA: Insufficient documentation

## 2013-02-27 DIAGNOSIS — R609 Edema, unspecified: Secondary | ICD-10-CM | POA: Insufficient documentation

## 2013-02-27 DIAGNOSIS — E785 Hyperlipidemia, unspecified: Secondary | ICD-10-CM | POA: Insufficient documentation

## 2013-02-27 DIAGNOSIS — S91309A Unspecified open wound, unspecified foot, initial encounter: Secondary | ICD-10-CM | POA: Insufficient documentation

## 2013-02-27 DIAGNOSIS — E119 Type 2 diabetes mellitus without complications: Secondary | ICD-10-CM | POA: Insufficient documentation

## 2013-02-27 DIAGNOSIS — W1809XA Striking against other object with subsequent fall, initial encounter: Secondary | ICD-10-CM | POA: Insufficient documentation

## 2013-02-27 DIAGNOSIS — IMO0002 Reserved for concepts with insufficient information to code with codable children: Secondary | ICD-10-CM | POA: Insufficient documentation

## 2013-02-27 MED ORDER — TETANUS-DIPHTH-ACELL PERTUSSIS 5-2.5-18.5 LF-MCG/0.5 IM SUSP
0.5000 mL | Freq: Once | INTRAMUSCULAR | Status: AC
  Administered 2013-02-27: 0.5 mL via INTRAMUSCULAR
  Filled 2013-02-27: qty 0.5

## 2013-02-27 MED ORDER — BACITRACIN 500 UNIT/GM EX OINT
1.0000 "application " | TOPICAL_OINTMENT | Freq: Two times a day (BID) | CUTANEOUS | Status: DC
  Filled 2013-02-27: qty 0.9

## 2013-02-27 MED ORDER — ACETAMINOPHEN 325 MG PO TABS: 650.0000 mg | ORAL_TABLET | Freq: Four times a day (QID) | ORAL | Status: DC | PRN

## 2013-02-27 NOTE — ED Notes (Signed)
Pt from [facility] via [facility] c/o a fall today where her knee gave out. She reports having knee pain and bilateral knee replacements. She tried to catch herself but was unable, she hit head on curb. No blood thinners, no LOC,  A&O x 4. Pt ambulatory on scene.

## 2013-02-27 NOTE — ED Notes (Signed)
MD Yao at bedside 

## 2013-02-27 NOTE — ED Notes (Signed)
PA at bedside.

## 2013-02-27 NOTE — ED Notes (Signed)
Transported to XRAY  

## 2013-02-27 NOTE — ED Notes (Signed)
Pt returned from XRAY 

## 2013-02-27 NOTE — ED Notes (Signed)
Bed: WA20 Expected date: 02/27/13 Expected time: 3:30 PM Means of arrival: Ambulance Comments: Fall

## 2013-02-27 NOTE — Discharge Instructions (Signed)
Please follow up with your primary care physician in 1-2 days. If you do not have one please call the [place] number listed above. Please follow up with the Neurosurgeon to schedule a follow up appointment. Please use Tylenol to help with pain. Please wear your soft collar until seen by Dr. Arnoldo Morale in his office. Please read all discharge instructions and return precautions.   Hematoma A hematoma is a collection of blood under the skin, in an organ, in a body space, in a joint space, or in other tissue. The blood can clot to form a lump that you can see and feel. The lump is often firm and may sometimes become sore and tender. Most hematomas get better in a few days to weeks. However, some hematomas may be serious and require medical care. Hematomas can range in size from very small to very large. CAUSES  A hematoma can be caused by a blunt or penetrating injury. It can also be caused by spontaneous leakage from a blood vessel under the skin. Spontaneous leakage from a blood vessel is more likely to occur in older people, especially those taking blood thinners. Sometimes, a hematoma can develop after certain medical procedures. SIGNS AND SYMPTOMS   A firm lump on the body.  Possible pain and tenderness in the area.  Bruising.Blue, dark blue, purple-red, or yellowish skin may appear at the site of the hematoma if the hematoma is close to the surface of the skin. For hematomas in deeper tissues or body spaces, the signs and symptoms may be subtle. For example, an intra-abdominal hematoma may cause abdominal pain, weakness, fainting, and shortness of breath. An intracranial hematoma may cause a headache or symptoms such as weakness, trouble speaking, or a change in consciousness. DIAGNOSIS  A hematoma can usually be diagnosed based on your medical history and a physical exam. Imaging tests may be needed if your health care provider suspects a hematoma in deeper tissues or body  spaces, such as the abdomen, head, or chest. These tests may include ultrasonography or a CT scan.  TREATMENT  Hematomas usually go away on their own over time. Rarely does the blood need to be drained out of the body. Large hematomas or those that may affect vital organs will sometimes need surgical drainage or monitoring. HOME CARE INSTRUCTIONS   Apply ice to the injured area:   Put ice in a plastic bag.   Place a towel between your skin and the bag.   Leave the ice on for 20 minutes, 2 3 times a day for the first 1 to 2 days.   After the first 2 days, switch to using warm compresses on the hematoma.   Elevate the injured area to help decrease pain and swelling. Wrapping the area with an elastic bandage may also be helpful. Compression helps to reduce swelling and promotes shrinking of the hematoma. Make sure the bandage is not wrapped too tight.   If your hematoma is on a lower extremity and is painful, crutches may be helpful for a couple days.   Only take over-the-counter or prescription medicines as directed by your health care provider. SEEK IMMEDIATE MEDICAL CARE IF:   You have increasing pain, or your pain is not controlled with medicine.   You have a fever.   You have worsening swelling or discoloration.   Your skin over the hematoma breaks or starts bleeding.   Your hematoma is in your chest or abdomen and you have weakness,  shortness of breath, or a change in consciousness.  Your hematoma is on your scalp (caused by a fall or injury) and you have a worsening headache or a change in alertness or consciousness. MAKE SURE YOU:   Understand these instructions.  Will watch your condition.  Will get help right away if you are not doing well or get worse. Document Released: 09/04/2003 Document Revised: 09/22/2012 Document Reviewed: 06/30/2012 [facility] Patient Information 2014 [place].   Cervical Spine Fracture, Stable A cervical spine fracture is a  break or crack in one of the bones of the neck. A fracture is stable if the chances of it causing you problems while it is healing are very small. CAUSES   Vehicle accidents.  Injuries from sports such as diving, football, biking, wrestling, or skiing.  Occasionally, severe osteoporosis or other bone diseases, such cancers that spread to bone or metabolic abnormalities. SYMPTOMS   Severe neck pain after an accident or fall.  Pain down your shoulders or arms.  Bruising or swelling on the back of your neck.  Numbness, tingling, muscle spasm, or weakness. DIAGNOSIS  Cervical spine fracture is diagnosed with the help of X-ray exams of your neck. Often a CT scan or MRI is used to confirm the diagnosis and help determine how your injury should be treated. Generally, an examination of your neck, arms and legs, and the history of your injury prompts the health care provider to order these tests.  TREATMENT  A stable fracture needs to be stabilized with a brace or cervical collar. A cervical collar is a two-piece collar designed to keep your neck from moving during the healing process. HOME CARE INSTRUCTIONS  Limit physical activity to prevent worsening of the fracture.  Apply ice to areas of pain 3 4 times a day for 2 days.  Put ice in a bag.  Place a towel between your skin and the bag.  Leave the ice on for 15 20 minutes, 3 4 times a day.  You may have been given a cervical collar to wear.  Do not remove the collar unless instructed by your health care provider.  If you have long hair, keep it outside of the collar.  Ask your health care provider before making any adjustments to your collar. Minor adjustments may be required over time to improve comfort and reduce pressure on your chin or on back of your head.  Keep your collar clean by wiping it with mild soap and water and drying it completely. The pads can be hand washed with soap and water and air dried completely.  If you  are allowed to remove the collar for cleaning or bathing, follow your health care provider's instructions on how to do so safely.  If you are allowed to remove the collar for cleaning and bathing, wash and dry the skin of your neck. Check your skin for irritation or sores. If you see any, tell your health care provider.  Only take over-the-counter or prescription medicines for pain, discomfort, or fever as directed by your health care provider.   Keep all follow-up appointments as directed by your health care provider. Not keeping an appointment could result in a chronic or permanent injury, pain, and disability. Additionally, X-rays or an MRI may be repeated 1 3 weeks after your initial appointment. This is to:  Make sure any other breaks or cracks were not missed.   Help identify stretched or torn ligaments.   Get your test results if you did  not get them when you were first evaluated. The results will determine whether you need other tests or treatment. It is your responsibility to get the results. SEEK MEDICAL CARE IF: You have irritation or sores on your skin from the cervical collar. SEEK IMMEDIATE MEDICAL CARE IF:   You have increasing pain in your neck.   You develop difficulties swallowing or breathing.  You develop swelling in your neck.   You have numbness, weakness, burning pain, or movement problems in the arms or legs.   You are unable to control your bowel or bladder (incontinence).   You have problems with coordination or difficulty walking. MAKE SURE YOU:   Understand these instructions.  Will watch your condition.  Will get help right away if you are not doing well or get worse. Document Released: 12/08/2003 Document Revised: 09/22/2012 Document Reviewed: 08/16/2012 [facility] Patient Information 2014 [place], [place].

## 2013-02-27 NOTE — ED Provider Notes (Signed)
CSN: [id number]     Arrival date & time 02/27/13  1526 History   First MD Initiated Contact with Patient 02/27/13 1528     Chief Complaint  Patient presents with  . Fall  . Head Injury   (Consider location/radiation/quality/duration/timing/severity/associated sxs/prior Treatment) HPI Comments: Patient is a 80 yo F PMHx significant for hypertension, COPD, anemia, sciatica, headaches, venous insufficiency, esophageal reflux, rosacea, cataracts presented to the emergency department after falling and hitting the right side of her head and body. Patient states she is returning home to the nursing home the grocery store for her to close to the curve, patient states she cut her foot, right knee gave out and cause her to fall. Patient states that she attempted to catch herself on the right side of her body but did hit her head on the ground. She denies any loss of consciousness or vomiting. The fall was witnessed by a nursing home aide was able to help the patient up. Patient states that she is having some right-sided rib pain from the fall. She is also endorsing some mild pain to the right forehead where she hit her head. Patient denies any chest pain, new or worsening shortness of breath from baseline, abdominal pain, nausea, vomiting, numbness or weakness. Patient is not on any blood thinners.  Patient is a 80 y.o. female presenting with fall and head injury.  Fall Associated symptoms include chest pain (right sided rib pain). Pertinent negatives include no abdominal pain, chills, fever, nausea or vomiting.  Head Injury Associated symptoms: no nausea and no vomiting     Past Medical History  Diagnosis Date  . Type II or unspecified type diabetes mellitus without mention of complication, not stated as uncontrolled   . Hypertension   . Arthritis   . Ulcer   . COPD (chronic obstructive pulmonary disease)   . Regional enteritis of unspecified site   . Other malaise and fatigue   . Anemia,  unspecified   . Other and unspecified hyperlipidemia   . Hypoxemia   . Pneumonia, organism unspecified   . Mucopurulent chronic bronchitis   . Palpitations   . Nontoxic uninodular goiter   . Abnormality of gait   . Fall from other slipping, tripping, or stumbling   . Cholelithiases   . Closed fracture of unspecified part of ramus of mandible   . Disturbance of skin sensation   . Sciatica   . Tension headache   . Coronary atherosclerosis of unspecified type of vessel, native or graft   . Osteoarthrosis, unspecified whether generalized or localized, unspecified site   . Insomnia, unspecified   . Edema   . Tension headache   . Pain in joint, lower leg 2010    right knee  . Unspecified venous (peripheral) insufficiency   . Esophageal reflux   . Rosacea   . Cataract 1990    Dr. Katy Fitch  . Pain in joint, shoulder region 2013    left  . Gastric ulcer with hemorrhage 2008   Past Surgical History  Procedure Laterality Date  . Total knee arthroplasty Bilateral 1993, 1998    knees  . Other surgical history  2008    Ulcer surgery   . Small intestine surgery    . Eye surgery Bilateral 1990    Dr. Katy Fitch  . Gastrojejunostomy  05/2004    secondary to ulcer  . Joint replacement Bilateral 1993-1998    total knee replacement   Family History  Problem Relation Age of Onset  .  Heart disease Father   . Cancer Sister   . Diabetes Brother   . Obesity Brother   . Mental retardation Daughter   . Obesity Daughter    History  Substance Use Topics  . Smoking status: Former Smoker -- 0.30 packs/day for 10 years    Types: Cigarettes    Quit date: 02/03/1973  . Smokeless tobacco: Never Used     Comment: former casual smoker  . Alcohol Use: No   OB History   Grav Para Term Preterm Abortions TAB SAB Ect Mult Living                 Review of Systems  Constitutional: Negative for fever and chills.  Respiratory: Negative for shortness of breath.   Cardiovascular: Positive for chest  pain (right sided rib pain). Negative for leg swelling.  Gastrointestinal: Negative for nausea, vomiting and abdominal pain.  Skin: Positive for wound (laceration).  All other systems reviewed and are negative.    Allergies  Adhesive; Aspirin; Codeine; Enalapril; Hydrocodone; and Pantoprazole sodium  Home Medications   Current Outpatient Rx  Name  Route  Sig  Dispense  Refill  . acetaminophen (TYLENOL) 650 MG CR tablet   Oral   Take 1,300 mg by mouth 3 (three) times daily as needed for pain.          Marland Kitchen amLODipine (NORVASC) 5 MG tablet   Oral   Take 5 mg by mouth daily.         . calcium carbonate (TUMS EX) 750 MG chewable tablet   Oral   Chew 1 tablet by mouth 3 (three) times daily as needed for heartburn (after meals if needed for heartburn).          . cetirizine (ZYRTEC) 10 MG tablet   Oral   Take 10 mg by mouth daily.         . cloNIDine (CATAPRES) 0.1 MG tablet   Oral   Take 0.1 mg by mouth as needed. Take 1 tablet for blood pressure greater than 160/100. May repeat in 1 hour if still greater than 160/100.         . furosemide (LASIX) 40 MG tablet   Oral   Take 40 mg by mouth.         Marland Kitchen glipiZIDE (GLUCOTROL) 10 MG tablet   Oral   Take 10 mg by mouth 2 (two) times daily before a meal.         . guaiFENesin (MUCINEX) 600 MG 12 hr tablet   Oral   Take 600 mg by mouth 2 (two) times daily.         . metFORMIN (GLUCOPHAGE) 500 MG tablet   Oral   Take 1,000 mg by mouth 2 (two) times daily with a meal.         . Multiple Vitamin (MULTIVITAMIN) capsule   Oral   Take 1 capsule by mouth daily.         . Multiple Vitamins-Minerals (EYE VITAMINS PO)   Oral   Take by mouth daily. Take 1 tablet for eye health         . Nebivolol HCl (BYSTOLIC) 20 MG TABS   Oral   Take 20 mg by mouth daily.         Marland Kitchen omeprazole (PRILOSEC) 40 MG capsule   Oral   Take 40 mg by mouth daily.         . Simethicone (GAS RELIEF 80 PO)   Oral  Take 1 tablet  by mouth daily.          . simvastatin (ZOCOR) 20 MG tablet   Oral   Take 20 mg by mouth daily.         . temazepam (RESTORIL) 15 MG capsule   Oral   Take 15 mg by mouth at bedtime as needed for sleep.         Marland Kitchen acetaminophen (TYLENOL) 325 MG tablet   Oral   Take 2 tablets (650 mg total) by mouth every 6 (six) hours as needed for mild pain, moderate pain or headache.   30 tablet   0    BP 172/64  Pulse 81  Temp(Src) 97.4 F (36.3 C) (Oral)  Resp 18  SpO2 93% Physical Exam  Constitutional: She is oriented to person, place, and time. She appears well-developed and well-nourished. No distress.  HENT:  Head: Normocephalic. Head is with abrasion and with contusion. Head is without raccoon's eyes, without Battle's sign and without laceration. Hair is normal.    Right Ear: External ear normal.  Left Ear: External ear normal.  Nose: Nose normal.  Mouth/Throat: Oropharynx is clear and moist. No oropharyngeal exudate.  Quarter size hematoma with abrasions noted on graphical documentation.  Eyes: Conjunctivae and EOM are normal. Pupils are equal, round, and reactive to light.  Neck: Normal range of motion and full passive range of motion without pain. Neck supple. No spinous process tenderness and no muscular tenderness present.  Cardiovascular: Normal rate, regular rhythm, normal heart sounds and intact distal pulses.   Pulmonary/Chest: Effort normal and breath sounds normal. No respiratory distress. She exhibits no tenderness and no retraction.  Abdominal: Soft. There is no tenderness.  Neurological: She is alert and oriented to person, place, and time. She has normal strength. No cranial nerve deficit or sensory deficit. Gait normal. GCS eye subscore is 4. GCS verbal subscore is 5. GCS motor subscore is 6.  No pronator drift. Bilateral heel-knee-shin intact.  Skin: Skin is warm and dry. She is not diaphoretic.    ED Course  Procedures (including critical care  time) Medications  bacitracin ointment 1 application (not administered)  Tdap (BOOSTRIX) injection 0.5 mL (0.5 mLs Intramuscular Given 02/27/13 1618)    Labs Review Labs Reviewed - No data to display Imaging Review Dg Ribs Unilateral W/chest Right  02/27/2013   CLINICAL DATA:  Right rib pain secondary to a fall today.  EXAM: RIGHT RIBS AND CHEST - 3+ VIEW  COMPARISON:  Chest x-ray dated 01/12/2012  FINDINGS: There is no fracture or other abnormality of the right ribs. There is a slight thoracolumbar scoliosis. No pneumothorax or pleural effusion. The patient has diffuse 6 since relation of the interstitial markings on a chronic basis. Heart size and pulmonary vascularity are normal. Fairly severe arthritic changes are present at the left shoulder.  IMPRESSION: Normal right ribs.  Chronic interstitial lung disease, progressed.   Electronically Signed   By: Rozetta Nunnery M.D.   On: 02/27/2013 16:41   Ct Head Wo Contrast  02/27/2013   CLINICAL DATA:  Fall, head trauma  EXAM: CT HEAD WITHOUT CONTRAST  CT CERVICAL SPINE WITHOUT CONTRAST  TECHNIQUE: Multidetector CT imaging of the head and cervical spine was performed following the standard protocol without intravenous contrast. Multiplanar CT image reconstructions of the cervical spine were also generated.  COMPARISON:  None.  FINDINGS: CT HEAD FINDINGS  Right frontal scalp hematoma is identified. Mild cortical volume loss noted with proportional ventricular prominence.  Areas of periventricular white matter hypodensity are most compatible with small vessel ischemic change. No acute hemorrhage, infarct, or mass lesion is identified. Evidence of scleral banding noted. No skull fracture. Orbits and paranasal sinuses are unremarkable.  CT CERVICAL SPINE FINDINGS  C1 through the cervicothoracic junction is visualized in its entirety. There is a fracture at the mid dens with a small surrounding hematoma and evidence of osteophytosis. Fracture margins are irregular.  No evidence for cord compression 5 bony fragment. Disc degenerative change noted from C4 through C7 inferiorly with mild osteophytosis and minimal narrowing at the bilateral neural foramina of C5-C6. No lymphadenopathy. Multilevel facet osteoarthritic change noted. Scarring at the lung apices is present. There is a 3.3 cm cystic left thyroid nodular lesion not further evaluated on today's exam. Atheromatous vascular calcification noted.  IMPRESSION: No acute intracranial finding.  Acute fracture at the mid portion of C2. Because of adjacent high density material but could indicate callus formation, this raises the question of an element of chronicity, but acute fracture in the setting of trauma cannot be excluded. MRI without contrast could be helpful for further evaluation.  Right frontal scalp hematoma.  Critical Value/emergent results were called by telephone at the time of interpretation on 02/27/2013 at 4:23 PM to Dr. Shirlyn Goltz, who verbally acknowledged these results.   Electronically Signed   By: Conchita Paris M.D.   On: 02/27/2013 16:33   Ct Cervical Spine Wo Contrast  02/27/2013   CLINICAL DATA:  Fall, head trauma  EXAM: CT HEAD WITHOUT CONTRAST  CT CERVICAL SPINE WITHOUT CONTRAST  TECHNIQUE: Multidetector CT imaging of the head and cervical spine was performed following the standard protocol without intravenous contrast. Multiplanar CT image reconstructions of the cervical spine were also generated.  COMPARISON:  None.  FINDINGS: CT HEAD FINDINGS  Right frontal scalp hematoma is identified. Mild cortical volume loss noted with proportional ventricular prominence. Areas of periventricular white matter hypodensity are most compatible with small vessel ischemic change. No acute hemorrhage, infarct, or mass lesion is identified. Evidence of scleral banding noted. No skull fracture. Orbits and paranasal sinuses are unremarkable.  CT CERVICAL SPINE FINDINGS  C1 through the cervicothoracic junction is  visualized in its entirety. There is a fracture at the mid dens with a small surrounding hematoma and evidence of osteophytosis. Fracture margins are irregular. No evidence for cord compression 5 bony fragment. Disc degenerative change noted from C4 through C7 inferiorly with mild osteophytosis and minimal narrowing at the bilateral neural foramina of C5-C6. No lymphadenopathy. Multilevel facet osteoarthritic change noted. Scarring at the lung apices is present. There is a 3.3 cm cystic left thyroid nodular lesion not further evaluated on today's exam. Atheromatous vascular calcification noted.  IMPRESSION: No acute intracranial finding.  Acute fracture at the mid portion of C2. Because of adjacent high density material but could indicate callus formation, this raises the question of an element of chronicity, but acute fracture in the setting of trauma cannot be excluded. MRI without contrast could be helpful for further evaluation.  Right frontal scalp hematoma.  Critical Value/emergent results were called by telephone at the time of interpretation on 02/27/2013 at 4:23 PM to Dr. Shirlyn Goltz, who verbally acknowledged these results.   Electronically Signed   By: Conchita Paris M.D.   On: 02/27/2013 16:33    EKG Interpretation   None       MDM   1. Fall   2. C2 cervical fracture     Filed Vitals:  02/27/13 1718  BP: 172/64  Pulse: 81  Temp:   Resp: 18    Afebrile, NAD, non-toxic appearing, AAOx4. No neurofocal deficits. Patient able to ambulate without assistance or difficulty. A small hematoma with abrasion noted to her right forehead. No posterior cervical tenderness or deformity appreciated. No other injuries appreciated during physical exam. CT scan of head and neck obtained for evaluation of mechanical fall. No intracranial process noted. Possible acute C2 fracture noted on CT cervical spine. Unable to assess whether this is a chronic fracture, after talking to patient she does acknowledge  she has a history of a C2 fracture. The case is discussed with Dr. Arnoldo Morale the on-call neurosurgeon who reviewed the imaging himself and felt this is an old fracture. Soft collar will be applied per his recommendation with followup in his office this week. Chest x-ray with unilateral right ribs was negative for acute abnormality. Return precautions discussed. Patient agreeable to plan. Patient stable at time of discharge. Discussed patient case with Dr. Darl Householder, who agrees with my plan.   Harlow Mares, PA-C 02/27/13 2356

## 2013-03-01 ENCOUNTER — Other Ambulatory Visit: Payer: Self-pay | Admitting: Internal Medicine

## 2013-03-02 NOTE — ED Provider Notes (Signed)
Medical screening examination/treatment/procedure(s) were conducted as a shared visit with non-physician practitioner(s) and myself.  I personally evaluated the patient during the encounter.  EKG Interpretation   None       [name] is a 80 y.o. female hx of HTN, COPD, arthritis here with fall. Mechanical fall when stepping out of a car. She hit right side of her head. Has pain in R chest and obvious contusion of R forehead. Neuro exam unremarkable. No syncope. No neck pain. CT head/neck, cxr ordered. CT head showed no intracranial bleed. CXR nl. CT neck showed possible C2 fracture. Patient has nl ROM of neck and no focal tenderness. She stated that she may have a previous fracture vs whiplash injury for which she had a soft collar. I called Dr. Arnoldo Morale, who reviewed image, and felt that its likely an old fracture. Moreover, she is not a surgical candidate given her age and she has no neuro deficits. She was able to have family bring her soft collar and was discharged with soft collar and neurosurgical f/u.    Wandra Arthurs, MD 03/02/13 [phone]

## 2013-03-08 ENCOUNTER — Encounter: Payer: Self-pay | Admitting: Internal Medicine

## 2013-03-08 ENCOUNTER — Non-Acute Institutional Stay: Payer: Medicare Other | Admitting: Internal Medicine

## 2013-03-08 VITALS — BP 146/60 | HR 76 | Wt 179.0 lb

## 2013-03-08 DIAGNOSIS — E119 Type 2 diabetes mellitus without complications: Secondary | ICD-10-CM

## 2013-03-08 DIAGNOSIS — S0003XA Contusion of scalp, initial encounter: Secondary | ICD-10-CM

## 2013-03-08 DIAGNOSIS — S1093XA Contusion of unspecified part of neck, initial encounter: Secondary | ICD-10-CM

## 2013-03-08 DIAGNOSIS — M25569 Pain in unspecified knee: Secondary | ICD-10-CM

## 2013-03-08 DIAGNOSIS — S12100A Unspecified displaced fracture of second cervical vertebra, initial encounter for closed fracture: Secondary | ICD-10-CM

## 2013-03-08 DIAGNOSIS — S0083XA Contusion of other part of head, initial encounter: Secondary | ICD-10-CM

## 2013-03-08 DIAGNOSIS — I10 Essential (primary) hypertension: Secondary | ICD-10-CM

## 2013-03-08 DIAGNOSIS — G4700 Insomnia, unspecified: Secondary | ICD-10-CM

## 2013-03-08 NOTE — Patient Instructions (Signed)
Continue current medications. See Dr Lorin Mercy about your knee.

## 2013-03-08 NOTE — Progress Notes (Signed)
Patient ID: [name], female   DOB: Dec 06, 1917, 80 y.o.   MRN: [id number]    Location:  [facility]   Place of Service: Clinic (12)    Allergies  Allergen Reactions  . Adhesive [Tape]     unknown  . Aspirin     unknown  . Codeine     unknown  . Enalapril Cough  . Hydrocodone     unknown  . Pantoprazole Sodium     unknown    Chief Complaint  Patient presents with  . Fall    on 02/27/13, right knee gave out, went to ER, abrasion right forehead, hurt in right rib area.    HPI:  [place] 02/27/13. Right knee gave way. Sustained abrasion and bruising of the right forehead and right rib area.. Seen in ER.  Denies neck pain.  Wants appt to see Dr. Lorin Mercy about her right knee.   Ran elevated BP yesterday, 186, 70. Took extra clonidine.  Home has run up to 146   Medications: Patient's Medications  New Prescriptions   No medications on file  Previous Medications   ACETAMINOPHEN (TYLENOL) 325 MG TABLET    Take 2 tablets (650 mg total) by mouth every 6 (six) hours as needed for mild pain, moderate pain or headache.   ACETAMINOPHEN (TYLENOL) 650 MG CR TABLET    Take 1,300 mg by mouth 3 (three) times daily as needed for pain.    AMLODIPINE (NORVASC) 5 MG TABLET    Take 5 mg by mouth daily.   BYSTOLIC 10 MG TABLET    TAKE 2 TABLETS EVERY DAY   CALCIUM CARBONATE (TUMS EX) 750 MG CHEWABLE TABLET    Chew 1 tablet by mouth 3 (three) times daily as needed for heartburn (after meals if needed for heartburn).    CETIRIZINE (ZYRTEC) 10 MG TABLET    Take 10 mg by mouth daily.   CLONIDINE (CATAPRES) 0.1 MG TABLET    Take 0.1 mg by mouth as needed. Take 1 tablet for blood pressure greater than 160/100. May repeat in 1 hour if still greater than 160/100.   FUROSEMIDE (LASIX) 40 MG TABLET    Take 40 mg by mouth.   GLIPIZIDE (GLUCOTROL) 10 MG TABLET    Take 10 mg by mouth 2 (two) times daily before a meal.   GUAIFENESIN (MUCINEX) 600 MG 12 HR TABLET    Take 600 mg by mouth 2 (two)  times daily.   METFORMIN (GLUCOPHAGE) 500 MG TABLET    Take 1,000 mg by mouth 2 (two) times daily with a meal.   MULTIPLE VITAMIN (MULTIVITAMIN) CAPSULE    Take 1 capsule by mouth daily.   MULTIPLE VITAMINS-MINERALS (EYE VITAMINS PO)    Take by mouth daily. Take 1 tablet for eye health   NEBIVOLOL HCL (BYSTOLIC) 20 MG TABS    Take 20 mg by mouth daily.   OMEPRAZOLE (PRILOSEC) 40 MG CAPSULE    Take 40 mg by mouth daily.   SIMETHICONE (GAS RELIEF 80 PO)    Take 1 tablet by mouth daily.    SIMVASTATIN (ZOCOR) 20 MG TABLET    Take 20 mg by mouth daily.   TEMAZEPAM (RESTORIL) 15 MG CAPSULE    Take 15 mg by mouth at bedtime as needed for sleep.  Modified Medications   No medications on file  Discontinued Medications   No medications on file     Review of Systems  Constitutional: Positive for activity change and fatigue. Negative  for fever and unexpected weight change.  HENT: Positive for hearing loss (bilateral hearing aids).   Eyes: Positive for visual disturbance (corrective lenses).  Respiratory: Positive for cough and shortness of breath.   Cardiovascular: Positive for leg swelling. Negative for chest pain and palpitations.  Gastrointestinal: Negative.   Endocrine: Negative.        Diabetic.  Genitourinary:       Urinary leakage. History of infections.  Musculoskeletal: Positive for neck pain and neck stiffness.       Unsteady gait. Joint pains.  Skin:       New lesion of the left leg medial calf.  Neurological:       History of numbness and headaches. Unsteady gait.  Hematological: Negative.   Psychiatric/Behavioral:       Awakens in the middle of the night. Bartholome Bill trouble falling asleep.    Filed Vitals:   03/08/13 0853  BP: 146/60  Pulse: 76  Weight: 179 lb (81.194 kg)   Physical Exam  Constitutional: She is oriented to person, place, and time. She appears well-developed and well-nourished. No distress.  HENT:  Head: Normocephalic and atraumatic.  Right Ear: External  ear normal.  Left Ear: External ear normal.  Nose: Nose normal.  Eyes: Conjunctivae and EOM are normal. Pupils are equal, round, and reactive to light.  Neck: Neck supple. No JVD present. No tracheal deviation present. No thyromegaly present.  Cardiovascular: Normal rate, regular rhythm, normal heart sounds and intact distal pulses.  Exam reveals no gallop and no friction rub.   No murmur heard. Pulmonary/Chest: Breath sounds normal. No respiratory distress. She has no wheezes. She has no rales.  Abdominal: Bowel sounds are normal. She exhibits no distension and no mass. There is no tenderness.  Genitourinary: Guaiac negative stool.  Vaginal atrophy. Narrowing of introitus.  Musculoskeletal: She exhibits edema (1+ bipedal).  Scars from bilateral TKR. Right knee seems to be loose when stressing the joint. No effusion. Instable gait.  Lymphadenopathy:    She has no cervical adenopathy.  Neurological: She is alert and oriented to person, place, and time. She has normal reflexes. No cranial nerve deficit. Coordination normal.  Diminished vibratory in both feet.  Skin: No rash noted. No erythema. No pallor.  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 Has tender scar, but no obvious reoccurrence. Hematoma of right forehead.  Psychiatric: She has a normal mood and affect. Her behavior is normal. Judgment and thought content normal.     Labs reviewed: No visits with results within 3 Month(s) from this visit. Latest known visit with results is:  Office Visit on 01/12/2012  Component Date Value Range Status  . Hemoglobin A1C 01/12/2012 7.0   Final  . WBC 01/12/2012 12.9* 4.6 - 10.2 K/uL Final  . Lymph, poc 01/12/2012 1.6  0.6 - 3.4 Final  . POC LYMPH PERCENT 01/12/2012 12.2  10 - 50 %L Final  . MID (cbc) 01/12/2012 0.9  0 - 0.9 Final  . POC MID % 01/12/2012 6.6  0 - 12 %M Final  . POC Granulocyte 01/12/2012 10.5* 2 - 6.9 Final  . Granulocyte percent 01/12/2012 81.2* 37 -  80 %G Final  . RBC 01/12/2012 4.22  4.04 - 5.48 M/uL Final  . Hemoglobin 01/12/2012 10.7* 12.2 - 16.2 g/dL Final  . HCT, POC 01/12/2012 36.2* 37.7 - 47.9 % Final  . MCV 01/12/2012 85.9  80 - 97 fL Final  . MCH, POC 01/12/2012 25.4* 27 - 31.2  pg Final  . MCHC 01/12/2012 29.6* 31.8 - 35.4 g/dL Final  . RDW, POC 01/12/2012 17.6   Final  . Platelet Count, POC 01/12/2012 340  142 - 424 K/uL Final  . MPV 01/12/2012 8.2  0 - 99.8 fL Final   Final result by Rad Results In Interface (02/27/13 16:33:41)    Narrative:    CLINICAL DATA:  Fall, head trauma  EXAM: CT HEAD WITHOUT CONTRAST  CT CERVICAL SPINE WITHOUT CONTRAST  TECHNIQUE: Multidetector CT imaging of the head and cervical spine was performed following the standard protocol without intravenous contrast. Multiplanar CT image reconstructions of the cervical spine were also generated.  COMPARISON:  None.  FINDINGS: CT HEAD FINDINGS  Right frontal scalp hematoma is identified. Mild cortical volume loss noted with proportional ventricular prominence. Areas of periventricular white matter hypodensity are most compatible with small vessel ischemic change. No acute hemorrhage, infarct, or mass lesion is identified. Evidence of scleral banding noted. No skull fracture. Orbits and paranasal sinuses are unremarkable.  CT CERVICAL SPINE FINDINGS  C1 through the cervicothoracic junction is visualized in its entirety. There is a fracture at the mid dens with a small surrounding hematoma and evidence of osteophytosis. Fracture margins are irregular. No evidence for cord compression 5 bony fragment. Disc degenerative change noted from C4 through C7 inferiorly with mild osteophytosis and minimal narrowing at the bilateral neural foramina of C5-C6. No lymphadenopathy. Multilevel facet osteoarthritic change noted. Scarring at the lung apices is present. There is a 3.3 cm cystic left thyroid nodular lesion not further evaluated on today's  exam. Atheromatous vascular calcification noted.  IMPRESSION: No acute intracranial finding.  Acute fracture at the mid portion of C2. Because of adjacent high density material but could indicate callus formation, this raises the question of an element of chronicity, but acute fracture in the setting of trauma cannot be excluded. MRI without contrast could be helpful for further evaluation.  Right frontal scalp hematoma.  Critical Value/emergent results were called by telephone at the time of interpretation on 02/27/2013 at 4:23 PM to Dr. Shirlyn Goltz, who verbally acknowledged these results.        Assessment/Plan  Fracture of C2 vertebra, closed: i think this is old. She has no pain.  Hypertension: some loss of control since her fall. Continue current meds  Insomnia, unspecified: She does not like the cost of the Silenor 3 mg  Contusion of forehead: resolving  Pain in joint, lower leg: referral to Dr. Lorin Mercy.  DM: continue current meds

## 2013-03-11 ENCOUNTER — Telehealth: Payer: Self-pay

## 2013-03-11 NOTE — Telephone Encounter (Signed)
appt with Dr. Lorin Mercy 03/22/13 at 3:30 for right knee. Called patient with date and time

## 2013-03-29 ENCOUNTER — Telehealth: Payer: Self-pay

## 2013-03-29 ENCOUNTER — Encounter: Payer: Self-pay | Admitting: Internal Medicine

## 2013-03-29 ENCOUNTER — Non-Acute Institutional Stay: Payer: Medicare Other | Admitting: Internal Medicine

## 2013-03-29 VITALS — BP 132/70 | HR 68

## 2013-03-29 DIAGNOSIS — M25569 Pain in unspecified knee: Secondary | ICD-10-CM

## 2013-03-29 DIAGNOSIS — I10 Essential (primary) hypertension: Secondary | ICD-10-CM

## 2013-03-29 DIAGNOSIS — E119 Type 2 diabetes mellitus without complications: Secondary | ICD-10-CM

## 2013-03-29 DIAGNOSIS — J449 Chronic obstructive pulmonary disease, unspecified: Secondary | ICD-10-CM

## 2013-03-29 NOTE — Telephone Encounter (Signed)
While patient was still at the clinic made appt for pre-op clearance for right TKA with Dr. Einar Gip 04/26/13 at 3:00; Dr. Gwenette Greet 04/19/13 at 2:45. Wrote dates and times and gave to patient.

## 2013-04-04 ENCOUNTER — Other Ambulatory Visit: Payer: Self-pay | Admitting: Internal Medicine

## 2013-04-11 NOTE — Progress Notes (Signed)
Patient ID: [name], female   DOB: [birth year], 80 y.o.   MRN: [id number]    Location:  [facility]   Place of Service: Clinic (12)    Allergies  Allergen Reactions  . Adhesive [Tape]     unknown  . Aspirin     unknown  . Codeine     unknown  . Enalapril Cough  . Hydrocodone     unknown  . Pantoprazole Sodium     unknown    Chief Complaint  Patient presents with  . Knee Pain    saw Dr. Lorin Mercy, he wants to do surgery on right knee. Needs to talk to Dr. Nyoka Cowden about this.    HPI:    Pain in joint, lower leg: Wants to know if surgery on the knee would be too risky to consider.  Hypertension: controlled  COPD (chronic obstructive pulmonary disease): followed by Dr. Gwenette Greet  Type II or unspecified type diabetes mellitus without mention of complication, not stated as uncontrolled: controlled. Home glucose 135-139 the last week.    Medications: Patient's Medications  New Prescriptions   GLIPIZIDE (GLUCOTROL) 5 MG TABLET    TAKE 2 TABLETS BEFORE BREAKFAST AND 2 TABLETS BEFORE SUPPER TO CONTROL DIABETES  Previous Medications   ACETAMINOPHEN (TYLENOL) 325 MG TABLET    Take 2 tablets (650 mg total) by mouth every 6 (six) hours as needed for mild pain, moderate pain or headache.   ACETAMINOPHEN (TYLENOL) 650 MG CR TABLET    Take 1,300 mg by mouth 3 (three) times [name] as needed for pain.    AMLODIPINE (NORVASC) 5 MG TABLET    Take 5 mg by mouth [name].   BYSTOLIC 10 MG TABLET    TAKE 2 TABLETS EVERY DAY   CALCIUM CARBONATE (TUMS EX) 750 MG CHEWABLE TABLET    Chew 1 tablet by mouth 3 (three) times [name] as needed for heartburn (after meals if needed for heartburn).    CETIRIZINE (ZYRTEC) 10 MG TABLET    Take 10 mg by mouth [name].   CLONIDINE (CATAPRES) 0.1 MG TABLET    Take 0.1 mg by mouth as needed. Take 1 tablet for blood pressure greater than 160/100. May repeat in 1 hour if still greater than 160/100.   FUROSEMIDE (LASIX) 40 MG TABLET    Take 40 mg by mouth.   GLIPIZIDE (GLUCOTROL) 10 MG TABLET    Take 10 mg by mouth 2 (two) times [name] before a meal.   GUAIFENESIN (MUCINEX) 600 MG 12 HR TABLET    Take 600 mg by mouth 2 (two) times [name].   METFORMIN (GLUCOPHAGE) 500 MG TABLET    Take 1,000 mg by mouth 2 (two) times [name] with a meal.   MULTIPLE VITAMIN (MULTIVITAMIN) CAPSULE    Take 1 capsule by mouth [name].   MULTIPLE VITAMINS-MINERALS (EYE VITAMINS PO)    Take by mouth [name]. Take 1 tablet for eye health   NEBIVOLOL HCL (BYSTOLIC) 20 MG TABS    Take 20 mg by mouth [name].   OMEPRAZOLE (PRILOSEC) 40 MG CAPSULE    Take 40 mg by mouth [name].   SIMETHICONE (GAS RELIEF 80 PO)    Take 1 tablet by mouth [name].    SIMVASTATIN (ZOCOR) 20 MG TABLET    Take 20 mg by mouth [name].   TEMAZEPAM (RESTORIL) 15 MG CAPSULE    Take 15 mg by mouth at bedtime as needed for sleep.  Modified Medications   No medications on file  Discontinued  Medications   No medications on file     Review of Systems  Constitutional: Positive for activity change and fatigue. Negative for fever and unexpected weight change.  HENT: Positive for hearing loss (bilateral hearing aids).   Eyes: Positive for visual disturbance (corrective lenses).  Respiratory: Positive for cough and shortness of breath.   Cardiovascular: Positive for leg swelling. Negative for chest pain and palpitations.  Gastrointestinal: Negative.   Endocrine: Negative.        Diabetic.  Genitourinary:       Urinary leakage. History of infections.  Musculoskeletal: Positive for neck pain and neck stiffness.       Unsteady gait. Joint pains.  Skin:       New lesion of the left leg medial calf.  Neurological:       History of numbness and headaches. Unsteady gait.  Hematological: Negative.   Psychiatric/Behavioral:       Awakens in the middle of the night. [name] trouble falling asleep.    Filed Vitals:   03/29/13 1124  BP: 132/70  Pulse: 68   Physical Exam  Constitutional: She is oriented to person, place,  and time. She appears well-developed and well-nourished. No distress.  HENT:  Head: Normocephalic and atraumatic.  Right Ear: External ear normal.  Left Ear: External ear normal.  Nose: Nose normal.  Eyes: Conjunctivae and EOM are normal. Pupils are equal, round, and reactive to light.  Neck: Neck supple. No JVD present. No tracheal deviation present. No thyromegaly present.  Cardiovascular: Normal rate, regular rhythm, normal heart sounds and intact distal pulses.  Exam reveals no gallop and no friction rub.   No murmur heard. Pulmonary/Chest: Breath sounds normal. No respiratory distress. She has no wheezes. She has no rales.  Abdominal: Bowel sounds are normal. She exhibits no distension and no mass. There is no tenderness.  Genitourinary:  Prior exam: Vaginal atrophy. Narrowing of introitus.  Musculoskeletal: She exhibits edema (1+ bipedal).  Scars from bilateral TKR. Right knee seems to be loose when stressing the joint. No effusion. Instable gait.  Lymphadenopathy:    She has no cervical adenopathy.  Neurological: She is alert and oriented to person, place, and time. She has normal reflexes. No cranial nerve deficit. Coordination normal.  Diminished vibratory in both feet.  Skin: No rash noted. No erythema. No pallor.  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 Has tender scar, but no obvious reoccurrence. Hematoma of right forehead.  Psychiatric: She has a normal mood and affect. Her behavior is normal. Judgment and thought content normal.     Labs reviewed: No visits with results within 3 Month(s) from this visit. Latest known visit with results is:  Office Visit on 01/12/2012  Component Date Value Ref Range Status  . Hemoglobin A1C 01/12/2012 7.0   Final  . WBC 01/12/2012 12.9* 4.6 - 10.2 K/uL Final  . Lymph, poc 01/12/2012 1.6  0.6 - 3.4 Final  . POC LYMPH PERCENT 01/12/2012 12.2  10 - 50 %L Final  . MID (cbc) 01/12/2012 0.9  0 - 0.9 Final  . POC MID  % 01/12/2012 6.6  0 - 12 %M Final  . POC Granulocyte 01/12/2012 10.5* 2 - 6.9 Final  . Granulocyte percent 01/12/2012 81.2* 37 - 80 %G Final  . RBC 01/12/2012 4.22  4.04 - 5.48 M/uL Final  . Hemoglobin 01/12/2012 10.7* 12.2 - 16.2 g/dL Final  . HCT, POC 50/38/8828 36.2* 37.7 - 47.9 % Final  . MCV  01/12/2012 85.9  80 - 97 fL Final  . MCH, POC 01/12/2012 25.4* 27 - 31.2 pg Final  . MCHC 01/12/2012 29.6* 31.8 - 35.4 g/dL Final  . RDW, POC 01/12/2012 17.6   Final  . Platelet Count, POC 01/12/2012 340  142 - 424 K/uL Final  . MPV 01/12/2012 8.2  0 - 99.8 fL Final      Assessment/Plan  1. Pain in joint, lower leg Refer to Dr. Einar Gip and Dr. Gwenette Greet for cardiac and pulmonary review of her conditions before I ca agree to surgery. Check CBC, CMP, A1c.  2. Hypertension controlled  3. COPD (chronic obstructive pulmonary disease) Currently compensated  4. Type II or unspecified type diabetes mellitus without mention of complication, not stated as uncontrolled controlled

## 2013-04-19 ENCOUNTER — Other Ambulatory Visit: Payer: Self-pay

## 2013-04-19 ENCOUNTER — Telehealth: Payer: Self-pay

## 2013-04-19 ENCOUNTER — Ambulatory Visit (INDEPENDENT_AMBULATORY_CARE_PROVIDER_SITE_OTHER): Payer: Medicare Other | Admitting: Pulmonary Disease

## 2013-04-19 ENCOUNTER — Encounter: Payer: Self-pay | Admitting: Pulmonary Disease

## 2013-04-19 VITALS — BP 148/62 | HR 82 | Temp 98.1°F | Ht 64.0 in | Wt 178.8 lb

## 2013-04-19 DIAGNOSIS — J449 Chronic obstructive pulmonary disease, unspecified: Secondary | ICD-10-CM

## 2013-04-19 MED ORDER — DICLOFENAC SODIUM 1 % TD GEL: TRANSDERMAL | Status: DC

## 2013-04-19 NOTE — Patient Instructions (Addendum)
Ok to stay off striverdi, but need to have albuterol to use for rescue ( 2 sprays every 4-6 hrs if needed).   You are at moderate risk for postop pulmonary complications after knee surgery as we discussed.  Will send a note to Dr. Lorin Mercy. Your oxygen levels appear to be adequate with walking similar to your usual daily pace.

## 2013-04-19 NOTE — Telephone Encounter (Signed)
Received a note from [facility] clinic nurse). [name] would like to try Voltaren 1% gel for her knee pain, to [facility]. Called patient Dr. Nyoka Cowden wrote order for the Voltaren gel, will [facility].

## 2013-04-19 NOTE — Progress Notes (Signed)
   Subjective:    Patient ID: [name], female    DOB: 06-09-1917, 80 y.o.   MRN: [id number]  HPI The patient comes in today for preop pulmonary evaluation. She has known mild to moderate COPD, probably on the basis of senile emphysema. She has been tried on 2 different LABA's, neither of which made a big difference in her day-to-day breathing. She is currently not using any maintenance medications, and does not have a rescue inhaler. She denies any significant cough or mucus production, and feels that her exertional tolerance is at baseline. She needs to have knee surgery, and it is unclear whether she needs to have a re-do of her knee prosthesis vs a repair.    Review of Systems  Constitutional: Negative for fever and unexpected weight change.  HENT: Negative for congestion, dental problem, ear pain, nosebleeds, postnasal drip, rhinorrhea, sinus pressure, sneezing, sore throat and trouble swallowing.   Eyes: Negative for redness and itching.  Respiratory: Positive for shortness of breath. Negative for cough, chest tightness and wheezing.   Cardiovascular: Negative for palpitations and leg swelling.  Gastrointestinal: Negative for nausea and vomiting.  Genitourinary: Negative for dysuria.  Musculoskeletal: Negative for joint swelling.  Skin: Negative for rash.  Neurological: Negative for headaches.  Hematological: Does not bruise/bleed easily.  Psychiatric/Behavioral: Negative for dysphoric mood. The patient is not nervous/anxious.        Objective:   Physical Exam Overweight female in no acute distress Nose without purulence or discharge noted Neck without lymphadenopathy or thyromegaly Chest with mildly decreased breath sounds, no active wheezes or rhonchi Cardiac exam with regular rate and rhythm Lower extremities with mild edema, no cyanosis Alert and oriented, moves all 4 extremities.       Assessment & Plan:

## 2013-04-19 NOTE — Assessment & Plan Note (Signed)
The patient has mild to moderate COPD, probably secondary to senile emphysema. She has no bronchospasm on exam, and has no symptoms of acute bronchitis, and feels that she is at her usual baseline from an exertional tolerance standpoint. I suspect that she is at a moderate risk for postop pulmonary complications such as pneumonia, respiratory failure, and thromboembolic disease. There is no contraindication for the surgery from a pulmonary standpoint, and the decision will have to be made based on how much pain she is having and how much this is impacting her quality of life.  She did not see any improvement on a LABA, but she does need to keep a rescue inhaler handy.

## 2013-05-19 LAB — BASIC METABOLIC PANEL
BUN: 18 mg/dL (ref 4–21)
Creatinine: 0.6 mg/dL (ref 0.5–1.1)
Glucose: 136 mg/dL
POTASSIUM: 4.1 mmol/L (ref 3.4–5.3)
Sodium: 135 mmol/L — AB (ref 137–147)

## 2013-05-19 LAB — CBC AND DIFFERENTIAL
HCT: 33 % — AB (ref 36–46)
Hemoglobin: 10.6 g/dL — AB (ref 12.0–16.0)
Platelets: 283 10*3/uL (ref 150–399)
WBC: 6.6 10^3/mL

## 2013-05-19 LAB — TSH: TSH: 2.43 u[IU]/mL (ref 0.41–5.90)

## 2013-05-19 LAB — HEMOGLOBIN A1C: Hgb A1c MFr Bld: 8 % — AB (ref 4.0–6.0)

## 2013-05-24 ENCOUNTER — Encounter: Payer: Self-pay | Admitting: Internal Medicine

## 2013-05-24 ENCOUNTER — Non-Acute Institutional Stay: Payer: Medicare Other | Admitting: Internal Medicine

## 2013-05-24 ENCOUNTER — Other Ambulatory Visit (HOSPITAL_BASED_OUTPATIENT_CLINIC_OR_DEPARTMENT_OTHER): Payer: Self-pay | Admitting: Internal Medicine

## 2013-05-24 VITALS — BP 160/62 | HR 72 | Wt 177.0 lb

## 2013-05-24 DIAGNOSIS — R609 Edema, unspecified: Secondary | ICD-10-CM | POA: Insufficient documentation

## 2013-05-24 DIAGNOSIS — Z794 Long term (current) use of insulin: Secondary | ICD-10-CM

## 2013-05-24 DIAGNOSIS — D509 Iron deficiency anemia, unspecified: Secondary | ICD-10-CM

## 2013-05-24 DIAGNOSIS — J479 Bronchiectasis, uncomplicated: Secondary | ICD-10-CM

## 2013-05-24 DIAGNOSIS — N183 Chronic kidney disease, stage 3 (moderate): Secondary | ICD-10-CM

## 2013-05-24 DIAGNOSIS — I10 Essential (primary) hypertension: Secondary | ICD-10-CM

## 2013-05-24 DIAGNOSIS — G4700 Insomnia, unspecified: Secondary | ICD-10-CM

## 2013-05-24 DIAGNOSIS — E1165 Type 2 diabetes mellitus with hyperglycemia: Secondary | ICD-10-CM

## 2013-05-24 DIAGNOSIS — E1122 Type 2 diabetes mellitus with diabetic chronic kidney disease: Secondary | ICD-10-CM | POA: Insufficient documentation

## 2013-05-24 DIAGNOSIS — IMO0001 Reserved for inherently not codable concepts without codable children: Secondary | ICD-10-CM

## 2013-05-24 DIAGNOSIS — M25569 Pain in unspecified knee: Secondary | ICD-10-CM

## 2013-05-24 MED ORDER — DOXEPIN HCL 10 MG PO CAPS: ORAL_CAPSULE | ORAL | Status: DC

## 2013-05-24 MED ORDER — FERROUS SULFATE 325 (65 FE) MG PO TABS: ORAL_TABLET | ORAL | Status: DC

## 2013-05-24 NOTE — Progress Notes (Signed)
Patient ID: [name], female   DOB: May 25, 1917, 80 y.o.   MRN: [id number]    Location:  [facility]   Place of Service: CLINIC    Allergies  Allergen Reactions  . Adhesive [Tape]     unknown  . Aspirin     unknown  . Codeine     unknown  . Enalapril Cough  . Hydrocodone     unknown  . Pantoprazole Sodium     unknown    Chief Complaint  Patient presents with  . Medical Management of Chronic Issues    blood pressure, COPD, blood sugar, right knee pain    HPI:  Hypertension: So she systolic blood pressures. Denies headache, chest pain, or palpitations.  Type II or unspecified type diabetes mellitus without mention of complication, uncontrolled: Several glucose determinations have been elevated recently.  Bronchiectasis without acute exacerbation: Followed by Dr. Gwenette Greet. Patient thinks she is doing fairly well at this time.  Pain in joint, lower leg: Continues with pain in knee. She has not decided on whether she should have surgery.  Insomnia, unspecified - doxepin 3 mg has been more helpful than temazepam. She finds that she wakes up frequently in the early morning hours. She denies morning hangover. She does get drowsy during the day.  Iron deficiency anemia -new problem. There have been no signs of obvious blood loss. She denies nausea or vomiting. Stool has been normal in color and without evidence of hematochezia or melena. There is no blood in the urine.  Edema: Worsening recently. She denies increasing sodium intake in the diet. She has been taking amlodipine for blood pressure control, 10 mg each morning.  Medications: Patient's Medications  New Prescriptions   No medications on file  Previous Medications   ACETAMINOPHEN (TYLENOL) 325 MG TABLET    Take 2 tablets (650 mg total) by mouth every 6 (six) hours as needed for mild pain, moderate pain or headache.   AMLODIPINE (NORVASC) 5 MG TABLET    Take 5 mg by mouth. Take 2 tablets in morning for blood pressure   BYSTOLIC 10 MG TABLET    TAKE 2 TABLETS EVERY DAY   CALCIUM CARBONATE (TUMS EX) 750 MG CHEWABLE TABLET    Chew 1 tablet by mouth 3 (three) times daily as needed for heartburn (after meals if needed for heartburn).    CETIRIZINE (ZYRTEC) 10 MG TABLET    Take 10 mg by mouth daily.   CLONIDINE (CATAPRES) 0.1 MG TABLET    Take 0.1 mg by mouth as needed. Take 1 tablet for blood pressure greater than 160/100. May repeat in 1 hour if still greater than 160/100.   DICLOFENAC SODIUM (VOLTAREN) 1 % GEL    Apply one inch of gel  to painful knee four times daily   DOXEPIN HCL (SILENOR) 3 MG TABS    Take by mouth. Take one tablet nightly to help sleep   FUROSEMIDE (LASIX) 40 MG TABLET    Take 40 mg by mouth.   GLIPIZIDE (GLUCOTROL) 5 MG TABLET    TAKE 2 TABLETS BEFORE BREAKFAST AND 2 TABLETS BEFORE SUPPER TO CONTROL DIABETES   GUAIFENESIN (MUCINEX) 600 MG 12 HR TABLET    Take 600 mg by mouth 2 (two) times daily.   METFORMIN (GLUCOPHAGE) 500 MG TABLET    Take 1,000 mg by mouth 2 (two) times daily with a meal.   MULTIPLE VITAMIN (MULTIVITAMIN) CAPSULE    Take 1 capsule by mouth daily.   MULTIPLE VITAMINS-MINERALS (EYE  VITAMINS PO)    Take by mouth daily. Take 1 tablet for eye health   OMEPRAZOLE (PRILOSEC) 40 MG CAPSULE    Take 40 mg by mouth daily.   SIMETHICONE (GAS RELIEF 80 PO)    Take 1 tablet by mouth daily.    SIMVASTATIN (ZOCOR) 20 MG TABLET    Take 20 mg by mouth daily.   TEMAZEPAM (RESTORIL) 15 MG CAPSULE    Take 15 mg by mouth at bedtime as needed for sleep.  Modified Medications   No medications on file  Discontinued Medications   No medications on file     Review of Systems  Constitutional: Positive for activity change and fatigue. Negative for fever and unexpected weight change.  HENT: Positive for hearing loss (bilateral hearing aids).   Eyes: Positive for visual disturbance (corrective lenses).  Respiratory: Positive for cough and shortness of breath.   Cardiovascular: Positive for leg  swelling. Negative for chest pain and palpitations.  Gastrointestinal: Negative.   Endocrine: Negative.        Diabetic.  Genitourinary:       Urinary leakage. History of infections.  Musculoskeletal: Positive for neck pain and neck stiffness.       Unsteady gait. Joint pains.  Skin:       New lesion of the left leg medial calf.  Neurological:       History of numbness and headaches. Unsteady gait.  Hematological:       Deficiency anemia noted 05/24/13.  Psychiatric/Behavioral:       Awakens in the middle of the night. Bartholome Bill trouble falling asleep.    Filed Vitals:   05/24/13 0953  BP: 160/62  Pulse: 72  Weight: 177 lb (80.287 kg)  SpO2: 88%   Physical Exam  Constitutional: She is oriented to person, place, and time. She appears well-developed and well-nourished. No distress.  HENT:  Head: Normocephalic and atraumatic.  Right Ear: External ear normal.  Left Ear: External ear normal.  Nose: Nose normal.  Eyes: Conjunctivae and EOM are normal. Pupils are equal, round, and reactive to light.  Neck: Neck supple. No JVD present. No tracheal deviation present. No thyromegaly present.  Cardiovascular: Normal rate, regular rhythm, normal heart sounds and intact distal pulses.  Exam reveals no gallop and no friction rub.   No murmur heard. Pulmonary/Chest: Breath sounds normal. No respiratory distress. She has no wheezes. She has no rales.  Abdominal: Bowel sounds are normal. She exhibits no distension and no mass. There is no tenderness.  Genitourinary:  Prior exam: Vaginal atrophy. Narrowing of introitus.  Musculoskeletal: She exhibits edema (1+ bipedal).  Scars from bilateral TKR. Right knee seems to be loose when stressing the joint. No effusion. Instable gait.  Lymphadenopathy:    She has no cervical adenopathy.  Neurological: She is alert and oriented to person, place, and time. She has normal reflexes. No cranial nerve deficit. Coordination normal.  Diminished vibratory in  both feet.  Skin: No rash noted. No erythema. No pallor.  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 Has tender scar, but no obvious reoccurrence. Hematoma of right forehead.  Psychiatric: She has a normal mood and affect. Her behavior is normal. Judgment and thought content normal.     Labs reviewed: [facility] Home on 05/24/2013  Component Date Value Ref Range Status  . Hemoglobin 05/19/2013 10.6* 12.0 - 16.0 g/dL Final  . HCT 05/19/2013 33* 36 - 46 % Final  . Platelets 05/19/2013 283  150 -  399 K/L Final  . WBC 05/19/2013 6.6   Final  . Glucose 05/19/2013 136   Final  . BUN 05/19/2013 18  4 - 21 mg/dL Final  . Creatinine 05/19/2013 0.6  0.5 - 1.1 mg/dL Final  . Potassium 05/19/2013 4.1  3.4 - 5.3 mmol/L Final  . Sodium 05/19/2013 135* 137 - 147 mmol/L Final  . Hemoglobin A1C 05/19/2013 8.0* 4.0 - 6.0 % Final  . TSH 05/19/2013 2.43  0.41 - 5.90 uIU/mL Final      Assessment/Plan  1. Hypertension Continue current medications  2. Type II or unspecified type diabetes mellitus without mention of complication, uncontrolled Lengthy discussion regarding dietary control, especially elimination of starches. She will attempt to do better with her diet over the next 3 months. Advised her that the next change in medication would probably be more costly to her. She will attempt to avoid graft, pasta, potatoes, desserts.  3. Bronchiectasis without acute exacerbation Stable  4. Pain in joint, lower leg Continues to have daily discomfort particularly in the right knee.  5. Insomnia, unspecified Discontinue doxepin 3 mg - doxepin (SINEQUAN) 10 MG capsule; One nightly for rest  Dispense: 30 capsule; Refill: 5  6. Iron deficiency anemia If anemia persists, she may need further GI workup. - ferrous sulfate 325 (65 FE) MG tablet; One daily to help correct anemia  Dispense: 60 tablet; Refill: 3  7. Edema Observe. May be related to amlodipine. She is now ready on furosemide  40 mg daily.

## 2013-05-27 ENCOUNTER — Telehealth: Payer: Self-pay

## 2013-05-27 ENCOUNTER — Other Ambulatory Visit: Payer: Self-pay

## 2013-05-27 MED ORDER — AMLODIPINE BESYLATE 5 MG PO TABS: ORAL_TABLET | ORAL | Status: DC

## 2013-05-27 NOTE — Telephone Encounter (Signed)
Needs Rx for Amlodipine 5mg  two tablets each morning for blood pressure [facility] Dr. Einar Gip suggested she take two daily. The Pharm won't refill the Rx for one daily, needs new Rx faxed in so Insurance will pay for it, other wise it's too early. Per Dr. Nyoka Cowden, ok to fax Rx for Amlodipine 10mg  daily. Called patient left message the Rx was faxed in.

## 2013-06-29 ENCOUNTER — Other Ambulatory Visit: Payer: Self-pay | Admitting: Internal Medicine

## 2013-07-19 ENCOUNTER — Non-Acute Institutional Stay: Payer: Medicare Other | Admitting: Internal Medicine

## 2013-07-19 ENCOUNTER — Encounter: Payer: Self-pay | Admitting: Internal Medicine

## 2013-07-19 VITALS — BP 162/68 | HR 68 | Temp 97.2°F | Wt 176.0 lb

## 2013-07-19 DIAGNOSIS — N393 Stress incontinence (female) (male): Secondary | ICD-10-CM

## 2013-07-19 DIAGNOSIS — D62 Acute posthemorrhagic anemia: Secondary | ICD-10-CM

## 2013-07-19 DIAGNOSIS — E119 Type 2 diabetes mellitus without complications: Secondary | ICD-10-CM

## 2013-07-19 DIAGNOSIS — E785 Hyperlipidemia, unspecified: Secondary | ICD-10-CM

## 2013-07-19 DIAGNOSIS — E1165 Type 2 diabetes mellitus with hyperglycemia: Secondary | ICD-10-CM

## 2013-07-19 DIAGNOSIS — R609 Edema, unspecified: Secondary | ICD-10-CM

## 2013-07-19 DIAGNOSIS — R21 Rash and other nonspecific skin eruption: Secondary | ICD-10-CM

## 2013-07-19 DIAGNOSIS — IMO0001 Reserved for inherently not codable concepts without codable children: Secondary | ICD-10-CM

## 2013-07-19 DIAGNOSIS — I10 Essential (primary) hypertension: Secondary | ICD-10-CM

## 2013-07-19 HISTORY — DX: Stress incontinence (female) (male): N39.3

## 2013-07-19 MED ORDER — TRIAMCINOLONE ACETONIDE 0.1 % EX CREA: TOPICAL_CREAM | CUTANEOUS | Status: DC

## 2013-07-19 MED ORDER — PRAVASTATIN SODIUM 20 MG PO TABS: ORAL_TABLET | ORAL | Status: DC

## 2013-07-19 NOTE — Progress Notes (Signed)
Patient ID: [name], female   DOB: 1917-08-01, 80 y.o.   MRN: [id number]    Location:  [facility]   Place of Service: Clinic (12)    Allergies  Allergen Reactions  . Adhesive [Tape]     unknown  . Aspirin     unknown  . Codeine     unknown  . Enalapril Cough  . Hydrocodone     unknown  . Pantoprazole Sodium     unknown    Chief Complaint  Patient presents with  . Leg Swelling    bilateral lower legs for couple of weeks  . Nocturia    gets up every 2 hours, no burning, urine clear. When she stands up can't hold it. For 2 weeks.    HPI:  Hypertension: Modest elevation in systolic blood pressure.  Type II or unspecified type diabetes mellitus without mention of complication, not stated as uncontrolled: Controlled  Anemia due to blood loss, acute: Currently on ferrous sulfate  Edema: Increasing. Amlodipine was increased to 10 mg in March 2015. Increasing edema was noted a month or 2 later.  Rash: Became more prominent as edema increased.  Hyperlipidemia: Pharmacokinetic testing suggests simvastatin the cardiac problems. Funduscopic seems to be a better choice.  Urine, incontinence, stress female: Patient says this is getting worse. She is getting up every 2 hours at night 2 weeks. She denies dysuria. Urine appears clear and there is no odor. The patient stands up, she has difficulty controlling her urine. Frequency has increased as has the urgency.    Medications: Patient's Medications  New Prescriptions   No medications on file  Previous Medications   ACETAMINOPHEN (TYLENOL) 325 MG TABLET    Take 2 tablets (650 mg total) by mouth every 6 (six) hours as needed for mild pain, moderate pain or headache.   AMLODIPINE (NORVASC) 5 MG TABLET    TAKE 1 TABLET BY MOUTH EVERY DAY   AMLODIPINE (NORVASC) 5 MG TABLET    Take 2 tablets in morning for blood pressure   BAYER CONTOUR TEST TEST STRIP    USE ONCE DAILY TO TEST BLOOD SUGAR.   BYSTOLIC 10 MG TABLET     TAKE 2 TABLETS EVERY DAY   CALCIUM CARBONATE (TUMS EX) 750 MG CHEWABLE TABLET    Chew 1 tablet by mouth 3 (three) times daily as needed for heartburn (after meals if needed for heartburn).    CETIRIZINE (ZYRTEC) 10 MG TABLET    Take 10 mg by mouth daily.   CLONIDINE (CATAPRES) 0.1 MG TABLET    Take 0.1 mg by mouth as needed. Take 1 tablet for blood pressure greater than 160/100. May repeat in 1 hour if still greater than 160/100.   DICLOFENAC SODIUM (VOLTAREN) 1 % GEL    Apply one inch of gel  to painful knee four times daily   DOXEPIN (SINEQUAN) 10 MG CAPSULE    One nightly for rest   FERROUS SULFATE 325 (65 FE) MG TABLET    One daily to help correct anemia   FUROSEMIDE (LASIX) 40 MG TABLET    Take 40 mg by mouth.   GLIPIZIDE (GLUCOTROL) 5 MG TABLET    TAKE 2 TABLETS BEFORE BREAKFAST AND 2 TABLETS BEFORE SUPPER TO CONTROL DIABETES   GUAIFENESIN (MUCINEX) 600 MG 12 HR TABLET    Take 600 mg by mouth 2 (two) times daily.   METFORMIN (GLUCOPHAGE) 500 MG TABLET    Take 1,000 mg by mouth 2 (two) times  daily with a meal.   MULTIPLE VITAMIN (MULTIVITAMIN) CAPSULE    Take 1 capsule by mouth daily.   MULTIPLE VITAMINS-MINERALS (EYE VITAMINS PO)    Take by mouth daily. Take 1 tablet for eye health   OMEPRAZOLE (PRILOSEC) 40 MG CAPSULE    Take 40 mg by mouth daily.   SIMETHICONE (GAS RELIEF 80 PO)    Take 1 tablet by mouth daily.    SIMVASTATIN (ZOCOR) 20 MG TABLET    Take 20 mg by mouth daily.  Modified Medications   No medications on file  Discontinued Medications   No medications on file     Review of Systems  Constitutional: Positive for activity change and fatigue. Negative for fever and unexpected weight change.  HENT: Positive for hearing loss (bilateral hearing aids).   Eyes: Positive for visual disturbance (corrective lenses).  Respiratory: Positive for cough and shortness of breath.   Cardiovascular: Positive for leg swelling. Negative for chest pain and palpitations.  Gastrointestinal:  Negative.   Endocrine: Negative.        Diabetic.  Genitourinary: Positive for urgency and frequency.       Urinary leakage. History of infections.  Musculoskeletal: Positive for neck pain and neck stiffness.       Unsteady gait. Joint pains.  Skin: Positive for rash.  Neurological:       History of numbness and headaches. Unsteady gait.  Hematological:       Deficiency anemia noted 05/24/13.  Psychiatric/Behavioral:       Awakens in the middle of the night. Bartholome Bill trouble falling asleep.    Filed Vitals:   07/19/13 0917  BP: 162/68  Pulse: 68  Temp: 97.2 F (36.2 C)  TempSrc: Oral  Weight: 176 lb (79.833 kg)  SpO2: 92%   Body mass index is 30.2 kg/(m^2).  Physical Exam  Constitutional: She is oriented to person, place, and time. She appears well-developed and well-nourished. No distress.  HENT:  Head: Normocephalic and atraumatic.  Right Ear: External ear normal.  Left Ear: External ear normal.  Nose: Nose normal.  Eyes: Conjunctivae and EOM are normal. Pupils are equal, round, and reactive to light.  Neck: Neck supple. No JVD present. No tracheal deviation present. No thyromegaly present.  Cardiovascular: Normal rate, regular rhythm, normal heart sounds and intact distal pulses.  Exam reveals no gallop and no friction rub.   No murmur heard. Pulmonary/Chest: Breath sounds normal. No respiratory distress. She has no wheezes. She has no rales.  Abdominal: Bowel sounds are normal. She exhibits no distension and no mass. There is no tenderness.  Genitourinary:  Prior exam: Vaginal atrophy. Narrowing of introitus.  Musculoskeletal: She exhibits edema (2-3+ bipedal).  Scars from bilateral TKR. Right knee seems to be loose when stressing the joint. No effusion. Instable gait.  Lymphadenopathy:    She has no cervical adenopathy.  Neurological: She is alert and oriented to person, place, and time. She has normal reflexes. No cranial nerve deficit. Coordination normal.    Diminished vibratory in both feet.  Skin: Rash (Reddened rash on both legs. Some itching and burning associated with the rash.) noted. No erythema. No pallor.  Red rash of both forelegs.  Psychiatric: She has a normal mood and affect. Her behavior is normal. Judgment and thought content normal.     Labs reviewed: Nursing Home on 05/24/2013  Component Date Value Ref Range Status  . Hemoglobin 05/19/2013 10.6* 12.0 - 16.0 g/dL Final  . HCT 05/19/2013 33* 36 - 46 %  Final  . Platelets 05/19/2013 283  150 - 399 K/L Final  . WBC 05/19/2013 6.6   Final  . Glucose 05/19/2013 136   Final  . BUN 05/19/2013 18  4 - 21 mg/dL Final  . Creatinine 05/19/2013 0.6  0.5 - 1.1 mg/dL Final  . Potassium 05/19/2013 4.1  3.4 - 5.3 mmol/L Final  . Sodium 05/19/2013 135* 137 - 147 mmol/L Final  . Hemoglobin A1C 05/19/2013 8.0* 4.0 - 6.0 % Final  . TSH 05/19/2013 2.43  0.41 - 5.90 uIU/mL Final      Assessment/Plan  1. Hypertension Saw patient in systolic blood pressure. Genetic testing suggests Norvasc may not be a good choice. We'll switch her to losartan 100 mg daily and continue the bisoprolol.  2. Type II or unspecified type diabetes mellitus without mention of complication, not stated as uncontrolled controlled  3. Anemia due to blood loss, acute Discontinue ferrous sulfate and repeat CBC prior to next visit  4. Edema I think there is a good chance that amlodipine as a part of the problem here this drug is being discontinued. It is to be replaced with losartan. She will continue furosemide 40 mg daily  5. Rash - triamcinolone cream (KENALOG) 0.1 %; Apply sparingly twice daily to the rash on the legs  Dispense: 120 g; Refill: 3  7. Hyperlipidemia Discontinued simvastatin. Replaced with pravastatin (PRAVACHOL) 20 MG tablet; One daily to lower cholesterol  Dispense: 90 tablet; Refill: 3  8. Urine, incontinence, stress female Patient is undergoing several changes in medications this visit. I  prefer not to start a new drug at this time. She is to return 08/30/13 and we will consider medications or further evaluation at that time.

## 2013-07-22 ENCOUNTER — Encounter: Payer: Self-pay | Admitting: Internal Medicine

## 2013-08-09 ENCOUNTER — Ambulatory Visit (INDEPENDENT_AMBULATORY_CARE_PROVIDER_SITE_OTHER): Payer: Medicare Other | Admitting: Pulmonary Disease

## 2013-08-09 ENCOUNTER — Encounter: Payer: Self-pay | Admitting: Pulmonary Disease

## 2013-08-09 VITALS — BP 120/68 | HR 88 | Temp 97.6°F | Ht 64.0 in | Wt 178.4 lb

## 2013-08-09 DIAGNOSIS — R0600 Dyspnea, unspecified: Secondary | ICD-10-CM

## 2013-08-09 DIAGNOSIS — R0609 Other forms of dyspnea: Secondary | ICD-10-CM

## 2013-08-09 DIAGNOSIS — J479 Bronchiectasis, uncomplicated: Secondary | ICD-10-CM

## 2013-08-09 DIAGNOSIS — R0989 Other specified symptoms and signs involving the circulatory and respiratory systems: Secondary | ICD-10-CM

## 2013-08-09 DIAGNOSIS — J449 Chronic obstructive pulmonary disease, unspecified: Secondary | ICD-10-CM

## 2013-08-09 NOTE — Assessment & Plan Note (Signed)
The patient has mild obstructive disease, and has chosen to use albuterol as needed rather than a daily maintenance treatment. She has not had an acute exacerbation since last visit, and feels that her exertional tolerance is at baseline. She did desaturate down to 88% walking from the waiting room, but feels that she does not do enough walking to justify being on a ventilatory oxygen. She will call if this becomes more of an issue for her.

## 2013-08-09 NOTE — Assessment & Plan Note (Signed)
The patient feels that her dyspnea on exertion is at her usual baseline. I believe this is multifactorial, and secondary to her frailty and debility, weight, and as well as her known mild COPD

## 2013-08-09 NOTE — Progress Notes (Signed)
   Subjective:    Patient ID: [name], female    DOB: [birth year], 80 y.o.   MRN: [id number]  HPI The patient comes in today for followup of her known COPD secondary to bronchiectasis. She has not had any significant cough or purulent mucus, and feels that her breathing is at her usual baseline. She decided against having knee surgery for many different reasons, and still keeps her albuterol inhaler available for rescue. Her saturations are adequate at the nursing home at rest, but she was 88% today walking from the waiting room.   Review of Systems  Constitutional: Negative for fever and unexpected weight change.  HENT: Negative for congestion, dental problem, ear pain, nosebleeds, postnasal drip, rhinorrhea, sinus pressure, sneezing, sore throat and trouble swallowing.   Eyes: Negative for redness and itching.  Respiratory: Positive for cough, shortness of breath and wheezing. Negative for chest tightness.   Cardiovascular: Negative for palpitations and leg swelling.  Gastrointestinal: Negative for nausea and vomiting.  Genitourinary: Negative for dysuria.  Musculoskeletal: Negative for joint swelling.  Skin: Negative for rash.  Neurological: Negative for headaches.  Hematological: Does not bruise/bleed easily.  Psychiatric/Behavioral: Negative for dysphoric mood. The patient is not nervous/anxious.        Objective:   Physical Exam Frail-appearing female in no acute distress Nose without purulence or discharge noted Neck without lymphadenopathy or thyromegaly Chest with a few scattered crackles, adequate airflow, no wheezing Cardiac exam with regular rate and rhythm Lower extremities with mild edema, no cyanosis Alert and oriented, moves all 4 extremities.       Assessment & Plan:

## 2013-08-09 NOTE — Patient Instructions (Signed)
Continue to keep your rescue inhaler handy if needed Let us know if your breathing becomes more of an issue, and we can consider different medications and possibly adding oxygen. followup with me again in 9mos.

## 2013-08-18 ENCOUNTER — Telehealth: Payer: Self-pay

## 2013-08-18 MED ORDER — CLONIDINE HCL 0.1 MG PO TABS: ORAL_TABLET | ORAL | Status: DC

## 2013-08-18 NOTE — Telephone Encounter (Signed)
Received phone call from [facility] clinic nurse at [facility] about patient's blood pressure and blood sugar elevated. Called patient to talk with her. When she saw Dr. Nyoka Cowden 07/19/13 he changed her medications, had patient get her bottle, Losartan 100mg  once a day, Bystolic 10mg  twice daily. She has Clonidine but it expired 2-3 years ago. BP & BS elevated for about one week. 08/16/13 178/101, BS 167; 08-17-13 175/79 BS 163; 08-18-13 188/72 BS 138. Patient denied's headaches, dizziness, edema in legs about the same as 07/19/13 visit. Spoke with Dr. Mariea Clonts call in new Rx for Clonidine, continue with current medications. If she develope any problems to see the clinic nurse. Keep appointment with Dr. Nyoka Cowden Tuesday.

## 2013-08-21 ENCOUNTER — Other Ambulatory Visit: Payer: Self-pay | Admitting: Internal Medicine

## 2013-08-23 ENCOUNTER — Encounter: Payer: Self-pay | Admitting: Internal Medicine

## 2013-08-23 ENCOUNTER — Non-Acute Institutional Stay: Payer: Medicare Other | Admitting: Internal Medicine

## 2013-08-23 VITALS — BP 164/70 | HR 60 | Wt 175.0 lb

## 2013-08-23 DIAGNOSIS — R609 Edema, unspecified: Secondary | ICD-10-CM

## 2013-08-23 DIAGNOSIS — E785 Hyperlipidemia, unspecified: Secondary | ICD-10-CM

## 2013-08-23 DIAGNOSIS — J411 Mucopurulent chronic bronchitis: Secondary | ICD-10-CM

## 2013-08-23 DIAGNOSIS — I10 Essential (primary) hypertension: Secondary | ICD-10-CM

## 2013-08-23 DIAGNOSIS — D509 Iron deficiency anemia, unspecified: Secondary | ICD-10-CM

## 2013-08-23 DIAGNOSIS — N393 Stress incontinence (female) (male): Secondary | ICD-10-CM

## 2013-08-23 DIAGNOSIS — E119 Type 2 diabetes mellitus without complications: Secondary | ICD-10-CM

## 2013-08-23 MED ORDER — METOPROLOL TARTRATE 50 MG PO TABS: ORAL_TABLET | ORAL | Status: DC

## 2013-08-23 NOTE — Progress Notes (Signed)
Patient ID: [name], female   DOB: 13-Jun-1917, 80 y.o.   MRN: [id number]    Location:  [facility]   Place of Service: Clinic (12)    Allergies  Allergen Reactions  . Adhesive [Tape]     unknown  . Aspirin     unknown  . Codeine     unknown  . Enalapril Cough  . Hydrocodone     unknown  . Pantoprazole Sodium     unknown    Chief Complaint  Patient presents with  . Blood Pressure Check    blood pressure and blood sugar been elevated.    HPI:  Patient is here today because of elevations her blood sugar and blood pressure recently. On 08/17/13 08/20/13 her blood sugars ran from 251 to 138. The same dates, her blood pressures ranged from 301-601U systolic and 93-235 diastolic. She denies feeling ill at the time this was occurring. She denies headache. There were no chest pains or palpitations. She has not had any increased respiratory illness. She denies diarrhea. There has been an increase in urinary frequency. She denies dysuria, but has been voiding every 2 hours at night.  Essential hypertension: Elevation in systolic and diastolic blood pressures over the last 2 weeks.  Mucopurulent chronic bronchitis-improved. She denies any sputum production or cough at this  Edema: Stable and present bilaterally about 1+.  Urine, incontinence, stress female: Chronic issue which is no worse, although the urinary frequency has increased.  Hyperlipidemia: Controlled  Iron deficiency anemia: His followup  Type II or unspecified type diabetes mellitus without mention of complication, not stated as uncontrolled: Unexplained problem with elevation of blood sugar to 251 mg percent 08/17/13. She denies dietary indiscretion that would have led to this.    Medications: Patient's Medications  New Prescriptions   No medications on file  Previous Medications   ACETAMINOPHEN (TYLENOL) 325 MG TABLET    Take 2 tablets (650 mg total) by mouth every 6 (six) hours as needed for mild  pain, moderate pain or headache.   BAYER CONTOUR TEST TEST STRIP    USE ONCE DAILY TO TEST BLOOD SUGAR.   BYSTOLIC 10 MG TABLET    TAKE 2 TABLETS EVERY DAY   CALCIUM CARBONATE (TUMS EX) 750 MG CHEWABLE TABLET    Chew 1 tablet by mouth 3 (three) times daily as needed for heartburn (after meals if needed for heartburn).    CETIRIZINE (ZYRTEC) 10 MG TABLET    Take 10 mg by mouth daily.   CLONIDINE (CATAPRES) 0.1 MG TABLET    Take 1 tablet for blood pressure greater than 160/100. May repeat in 1 hour if still greater than 160/100.   DICLOFENAC SODIUM (VOLTAREN) 1 % GEL    Apply one inch of gel  to painful knee four times daily   DOXEPIN (SINEQUAN) 10 MG CAPSULE    One nightly for rest   FUROSEMIDE (LASIX) 40 MG TABLET    Take 40 mg by mouth.   FUROSEMIDE (LASIX) 40 MG TABLET    TAKE 1 TABLET EVERY DAY TO CONTROL EDEMA   GLIPIZIDE (GLUCOTROL) 5 MG TABLET    TAKE 2 TABLETS BEFORE BREAKFAST AND 2 TABLETS BEFORE SUPPER TO CONTROL DIABETES   GUAIFENESIN (MUCINEX) 600 MG 12 HR TABLET    Take 600 mg by mouth 2 (two) times daily.   LOSARTAN (COZAAR) 100 MG TABLET    Take 100 mg by mouth. Take one tablet daily for blood pressure   METFORMIN (  GLUCOPHAGE) 500 MG TABLET    Take 1,000 mg by mouth 2 (two) times daily with a meal.   MULTIPLE VITAMIN (MULTIVITAMIN) CAPSULE    Take 1 capsule by mouth daily.   MULTIPLE VITAMINS-MINERALS (EYE VITAMINS PO)    Take by mouth daily. Take 1 tablet for eye health   OMEPRAZOLE (PRILOSEC) 40 MG CAPSULE    Take 40 mg by mouth daily.   PRAVASTATIN (PRAVACHOL) 20 MG TABLET    One daily to lower cholesterol   SIMETHICONE (GAS RELIEF 80 PO)    Take 1 tablet by mouth daily.    TRIAMCINOLONE CREAM (KENALOG) 0.1 %    Apply sparingly twice daily to the rash on the legs  Modified Medications   No medications on file  Discontinued Medications   No medications on file     Review of Systems  Constitutional: Positive for activity change and fatigue. Negative for fever and unexpected  weight change.  HENT: Positive for hearing loss (bilateral hearing aids).   Eyes: Positive for visual disturbance (corrective lenses).  Respiratory: Positive for cough and shortness of breath.   Cardiovascular: Positive for leg swelling. Negative for chest pain and palpitations.  Gastrointestinal: Negative.   Endocrine: Negative.        Diabetic.  Genitourinary: Positive for urgency and frequency.       Urinary leakage. History of infections.  Musculoskeletal: Positive for neck pain and neck stiffness.       Unsteady gait. Joint pains.  Skin: Positive for rash.  Neurological:       History of numbness and headaches. Unsteady gait.  Hematological:       Deficiency anemia noted 05/24/13.  Psychiatric/Behavioral:       Awakens in the middle of the night. Bartholome Bill trouble falling asleep.    Filed Vitals:   08/23/13 1111  BP: 164/70  Pulse: 60  Weight: 175 lb (79.379 kg)  SpO2: 94%   Body mass index is 30.02 kg/(m^2).  Physical Exam  Constitutional: She is oriented to person, place, and time. She appears well-developed and well-nourished. No distress.  HENT:  Head: Normocephalic and atraumatic.  Right Ear: External ear normal.  Left Ear: External ear normal.  Nose: Nose normal.  Eyes: Conjunctivae and EOM are normal. Pupils are equal, round, and reactive to light.  Neck: Neck supple. No JVD present. No tracheal deviation present. No thyromegaly present.  Cardiovascular: Normal rate, regular rhythm, normal heart sounds and intact distal pulses.  Exam reveals no gallop and no friction rub.   No murmur heard. Pulmonary/Chest: Breath sounds normal. No respiratory distress. She has no wheezes. She has no rales.  Abdominal: Bowel sounds are normal. She exhibits no distension and no mass. There is no tenderness.  Genitourinary:  Prior exam: Vaginal atrophy. Narrowing of introitus.  Musculoskeletal: She exhibits edema (2-3+ bipedal).  Scars from bilateral TKR. Right knee seems to be  loose when stressing the joint. No effusion. Instable gait. Driving a power scooter today.  Lymphadenopathy:    She has no cervical adenopathy.  Neurological: She is alert and oriented to person, place, and time. She has normal reflexes. No cranial nerve deficit. Coordination normal.  Diminished vibratory in both feet.  Skin: Rash (Reddened rash on both legs. Some itching and burning associated with the rash.) noted. No erythema. No pallor.  Red rash of both forelegs.  Psychiatric: She has a normal mood and affect. Her behavior is normal. Judgment and thought content normal.     Labs reviewed:  No visits with results within 3 Month(s) from this visit. Latest known visit with results is:  Nursing Home on 05/24/2013  Component Date Value Ref Range Status  . Hemoglobin 05/19/2013 10.6* 12.0 - 16.0 g/dL Final  . HCT 05/19/2013 33* 36 - 46 % Final  . Platelets 05/19/2013 283  150 - 399 K/L Final  . WBC 05/19/2013 6.6   Final  . Glucose 05/19/2013 136   Final  . BUN 05/19/2013 18  4 - 21 mg/dL Final  . Creatinine 05/19/2013 0.6  0.5 - 1.1 mg/dL Final  . Potassium 05/19/2013 4.1  3.4 - 5.3 mmol/L Final  . Sodium 05/19/2013 135* 137 - 147 mmol/L Final  . Hemoglobin A1C 05/19/2013 8.0* 4.0 - 6.0 % Final  . TSH 05/19/2013 2.43  0.41 - 5.90 uIU/mL Final      Assessment/Plan  1. Essential hypertension She wants to get off of Bystolic because of the cost. It doesn't seem to be doing a good job with her control. We will discontinue it and start metoprolol. She remains on clonidine and losartan as well. - metoprolol (LOPRESSOR) 50 MG tablet; One twice daily to control BP and heart rate  Dispense: 180 tablet; Refill: 3  2. Mucopurulent chronic bronchitis Improved  3. Edema Unchanged  4. Urine, incontinence, stress female Unchanged  5. Hyperlipidemia Followup at future visit  6. Iron deficiency anemia Recheck at future visit  7. Type II or unspecified type diabetes mellitus  without mention of complication, not stated as uncontrolled Remains on metformin and glipizide. Continue to monitor and get A1c and CMP next visit.

## 2013-08-25 LAB — LIPID PANEL
Cholesterol: 123 mg/dL (ref 0–200)
HDL: 54 mg/dL (ref 35–70)
LDL CALC: 26 mg/dL
Triglycerides: 215 mg/dL — AB (ref 40–160)

## 2013-08-25 LAB — CBC AND DIFFERENTIAL
HCT: 40 % (ref 36–46)
Hemoglobin: 13.3 g/dL (ref 12.0–16.0)
Neutrophils Absolute: 5 /uL
Platelets: 267 10*3/uL (ref 150–399)
WBC: 7.3 10^3/mL

## 2013-08-25 LAB — BASIC METABOLIC PANEL
BUN: 15 mg/dL (ref 4–21)
CREATININE: 0.7 mg/dL (ref 0.5–1.1)
GLUCOSE: 155 mg/dL
Potassium: 4 mmol/L (ref 3.4–5.3)
Sodium: 137 mmol/L (ref 137–147)

## 2013-08-25 LAB — HEPATIC FUNCTION PANEL
ALK PHOS: 90 U/L (ref 25–125)
ALT: 11 U/L (ref 7–35)
AST: 15 U/L (ref 13–35)
BILIRUBIN, TOTAL: 0.3 mg/dL

## 2013-08-25 LAB — HEMOGLOBIN A1C: Hgb A1c MFr Bld: 7.2 % — AB (ref 4.0–6.0)

## 2013-08-30 ENCOUNTER — Encounter: Payer: Self-pay | Admitting: Internal Medicine

## 2013-08-30 ENCOUNTER — Non-Acute Institutional Stay: Payer: Medicare Other | Admitting: Internal Medicine

## 2013-08-30 VITALS — BP 162/74 | HR 74 | Wt 174.0 lb

## 2013-08-30 DIAGNOSIS — R0600 Dyspnea, unspecified: Secondary | ICD-10-CM

## 2013-08-30 DIAGNOSIS — E785 Hyperlipidemia, unspecified: Secondary | ICD-10-CM

## 2013-08-30 DIAGNOSIS — R0609 Other forms of dyspnea: Secondary | ICD-10-CM

## 2013-08-30 DIAGNOSIS — R5383 Other fatigue: Secondary | ICD-10-CM

## 2013-08-30 DIAGNOSIS — R609 Edema, unspecified: Secondary | ICD-10-CM

## 2013-08-30 DIAGNOSIS — R5381 Other malaise: Secondary | ICD-10-CM

## 2013-08-30 DIAGNOSIS — IMO0001 Reserved for inherently not codable concepts without codable children: Secondary | ICD-10-CM

## 2013-08-30 DIAGNOSIS — I10 Essential (primary) hypertension: Secondary | ICD-10-CM

## 2013-08-30 DIAGNOSIS — N393 Stress incontinence (female) (male): Secondary | ICD-10-CM

## 2013-08-30 DIAGNOSIS — D509 Iron deficiency anemia, unspecified: Secondary | ICD-10-CM

## 2013-08-30 DIAGNOSIS — E1165 Type 2 diabetes mellitus with hyperglycemia: Secondary | ICD-10-CM

## 2013-08-30 DIAGNOSIS — R0989 Other specified symptoms and signs involving the circulatory and respiratory systems: Secondary | ICD-10-CM

## 2013-08-30 MED ORDER — DOXAZOSIN MESYLATE 4 MG PO TABS: ORAL_TABLET | ORAL | Status: DC

## 2013-08-30 NOTE — Progress Notes (Signed)
Patient ID: [name], female   DOB: 07-May-1917, 80 y.o.   MRN: [id number]    Location:  [facility]   Place of Service: Clinic (12)    Allergies  Allergen Reactions  . Adhesive [Tape]     unknown  . Aspirin     unknown  . Codeine     unknown  . Enalapril Cough  . Hydrocodone     unknown  . Pantoprazole Sodium     unknown    Chief Complaint  Patient presents with  . Medical Management of Chronic Issues    blood pressure, blood sugar, edema, anemia    HPI:  Essential hypertension: continues with elevated SBP  Type II or unspecified type diabetes mellitus without mention of complication, uncontrolled: still high on many CBG, but she has lost 4# and is making good cdiet choices. Her A1c dropped from 8 to 7.2%.  Urine, incontinence, stress female: continues to be an issue to her  Other malaise and fatigue: continues to feel like she has no energy. Says she is sleeping OK Denies anxiety or depression  Iron deficiency anemia: resolved  Hyperlipidemia: controlled  Edema: 1+ bipedal  DOE (dyspnea on exertion): unchanged    Medications: Patient's Medications  New Prescriptions   No medications on file  Previous Medications   ACETAMINOPHEN (TYLENOL) 325 MG TABLET    Take 2 tablets (650 mg total) by mouth every 6 (six) hours as needed for mild pain, moderate pain or headache.   BAYER CONTOUR TEST TEST STRIP    USE ONCE DAILY TO TEST BLOOD SUGAR.   CALCIUM CARBONATE (TUMS EX) 750 MG CHEWABLE TABLET    Chew 1 tablet by mouth 3 (three) times daily as needed for heartburn (after meals if needed for heartburn).    CETIRIZINE (ZYRTEC) 10 MG TABLET    Take 10 mg by mouth daily.   CLONIDINE (CATAPRES) 0.1 MG TABLET    Take 1 tablet for blood pressure greater than 160/100. May repeat in 1 hour if still greater than 160/100.   DICLOFENAC SODIUM (VOLTAREN) 1 % GEL    Apply one inch of gel  to painful knee four times daily   DOXEPIN (SINEQUAN) 10 MG CAPSULE    One  nightly for rest   GLIPIZIDE (GLUCOTROL) 5 MG TABLET    TAKE 2 TABLETS BEFORE BREAKFAST AND 2 TABLETS BEFORE SUPPER TO CONTROL DIABETES   GUAIFENESIN (MUCINEX) 600 MG 12 HR TABLET    Take 600 mg by mouth 2 (two) times daily.   LOSARTAN (COZAAR) 100 MG TABLET    Take 100 mg by mouth. Take one tablet daily for blood pressure   METFORMIN (GLUCOPHAGE) 500 MG TABLET    Take 1,000 mg by mouth 2 (two) times daily with a meal.   METOPROLOL (LOPRESSOR) 50 MG TABLET    One twice daily to control BP and heart rate   MULTIPLE VITAMIN (MULTIVITAMIN) CAPSULE    Take 1 capsule by mouth daily.   MULTIPLE VITAMINS-MINERALS (EYE VITAMINS PO)    Take by mouth daily. Take 1 tablet for eye health   OMEPRAZOLE (PRILOSEC) 40 MG CAPSULE    Take 40 mg by mouth daily.   PRAVASTATIN (PRAVACHOL) 20 MG TABLET    One daily to lower cholesterol   SIMETHICONE (GAS RELIEF 80 PO)    Take 1 tablet by mouth daily.    TRIAMCINOLONE CREAM (KENALOG) 0.1 %    Apply sparingly twice daily to the rash on the legs  Modified Medications   No medications on file  Discontinued Medications   BYSTOLIC 10 MG TABLET    TAKE 2 TABLETS EVERY DAY   FUROSEMIDE (LASIX) 40 MG TABLET    TAKE 1 TABLET EVERY DAY TO CONTROL EDEMA     Review of Systems  Constitutional: Positive for activity change and fatigue. Negative for fever and unexpected weight change.  HENT: Positive for hearing loss (bilateral hearing aids).   Eyes: Positive for visual disturbance (corrective lenses).  Respiratory: Positive for cough and shortness of breath.   Cardiovascular: Positive for leg swelling. Negative for chest pain and palpitations.  Gastrointestinal: Negative.   Endocrine: Negative.        Diabetic.  Genitourinary:       Urinary leakage. History of infections.  Musculoskeletal: Positive for neck pain and neck stiffness.       Unsteady gait. Joint pains.  Skin:       Lesion of the left leg medial calf.  Neurological:       History of numbness and  headaches. Unsteady gait.  Hematological: Negative.   Psychiatric/Behavioral:       Awakens in the middle of the night. Bartholome Bill trouble falling asleep.    Filed Vitals:   08/30/13 1001  BP: 162/74  Pulse: 74  Weight: 174 lb (78.926 kg)   Body mass index is 29.85 kg/(m^2).  Physical Exam  Constitutional: She is oriented to person, place, and time. She appears well-developed and well-nourished. No distress.  HENT:  Head: Normocephalic and atraumatic.  Right Ear: External ear normal.  Left Ear: External ear normal.  Nose: Nose normal.  Eyes: Conjunctivae and EOM are normal. Pupils are equal, round, and reactive to light.  Neck: Neck supple. No JVD present. No tracheal deviation present. No thyromegaly present.  Cardiovascular: Normal rate, regular rhythm, normal heart sounds and intact distal pulses.  Exam reveals no gallop and no friction rub.   No murmur heard. Pulmonary/Chest: Breath sounds normal. No respiratory distress. She has no wheezes. She has no rales.  Abdominal: Bowel sounds are normal. She exhibits no distension and no mass. There is no tenderness.  Genitourinary:  Prior exam: Vaginal atrophy. Narrowing of introitus.  Musculoskeletal: She exhibits edema (2-3+ bipedal).  Scars from bilateral TKR. Right knee seems to be loose when stressing the joint. No effusion. Instable gait. Driving a power scooter today.  Lymphadenopathy:    She has no cervical adenopathy.  Neurological: She is alert and oriented to person, place, and time. She has normal reflexes. No cranial nerve deficit. Coordination normal.  Diminished vibratory in both feet.  Skin: Rash (Reddened rash on both legs. Some itching and burning associated with the rash.) noted. No erythema. No pallor.  Red rash of both forelegs.  Psychiatric: She has a normal mood and affect. Her behavior is normal. Judgment and thought content normal.     Labs reviewed: Nursing Home on 08/30/2013  Component Date Value Ref  Range Status  . Hemoglobin 08/25/2013 13.3  12.0 - 16.0 g/dL Final  . HCT 08/25/2013 40  36 - 46 % Final  . Neutrophils Absolute 08/25/2013 5   Final  . Platelets 08/25/2013 267  150 - 399 K/L Final  . WBC 08/25/2013 7.3   Final  . Glucose 08/25/2013 155   Final  . BUN 08/25/2013 15  4 - 21 mg/dL Final  . Creatinine 08/25/2013 0.7  0.5 - 1.1 mg/dL Final  . Potassium 08/25/2013 4.0  3.4 - 5.3 mmol/L Final  .  Sodium 08/25/2013 137  137 - 147 mmol/L Final  . Triglycerides 08/25/2013 215* 40 - 160 mg/dL Final  . Cholesterol 08/25/2013 123  0 - 200 mg/dL Final  . HDL 08/25/2013 54  35 - 70 mg/dL Final  . LDL Cholesterol 08/25/2013 26   Final  . Alkaline Phosphatase 08/25/2013 90  25 - 125 U/L Final  . ALT 08/25/2013 11  7 - 35 U/L Final  . AST 08/25/2013 15  13 - 35 U/L Final  . Bilirubin, Total 08/25/2013 0.3   Final  . Hemoglobin A1C 08/25/2013 7.2* 4.0 - 6.0 % Final      Assessment/Plan  1. Essential hypertension Add: doxazosin (CARDURA) 4 MG tablet; One daily to control BP  Dispense: 30 tablet; Refill: 5  2. Type II or unspecified type diabetes mellitus without mention of complication, uncontrolled Continue currentmedications  3. Urine, incontinence, stress female Consider medicating next visit  4. Other malaise and fatigue Etiology obscure  5. Iron deficiency anemia resolved  6. Hyperlipidemia controlled  7. Edema Stable to iimproved  8. DOE (dyspnea on exertion) unchanged   FUTURE LAB: CBC, TSH, lipids, A1c, microalbumin

## 2013-09-13 ENCOUNTER — Non-Acute Institutional Stay: Payer: Medicare Other | Admitting: Internal Medicine

## 2013-09-13 ENCOUNTER — Encounter: Payer: Self-pay | Admitting: Internal Medicine

## 2013-09-13 VITALS — BP 150/64 | HR 88 | Wt 176.0 lb

## 2013-09-13 DIAGNOSIS — J411 Mucopurulent chronic bronchitis: Secondary | ICD-10-CM

## 2013-09-13 DIAGNOSIS — R609 Edema, unspecified: Secondary | ICD-10-CM

## 2013-09-13 DIAGNOSIS — R351 Nocturia: Secondary | ICD-10-CM

## 2013-09-13 DIAGNOSIS — I10 Essential (primary) hypertension: Secondary | ICD-10-CM

## 2013-09-13 DIAGNOSIS — E119 Type 2 diabetes mellitus without complications: Secondary | ICD-10-CM

## 2013-09-13 DIAGNOSIS — R002 Palpitations: Secondary | ICD-10-CM

## 2013-09-13 MED ORDER — DOXAZOSIN MESYLATE 8 MG PO TABS: ORAL_TABLET | ORAL | Status: DC

## 2013-09-13 NOTE — Progress Notes (Signed)
Patient ID: [name], female   DOB: Dec 12, 1917, 80 y.o.   MRN: [id number]    Location:  [facility]   Place of Service: Clinic (12)    Allergies  Allergen Reactions  . Adhesive [Tape]     unknown  . Aspirin     unknown  . Codeine     unknown  . Enalapril Cough  . Hydrocodone     unknown  . Pantoprazole Sodium     unknown    Chief Complaint  Patient presents with  . Blood Pressure Check    and blood sugars high;     HPI:  Essential hypertension: still has high SBP.some headaches. Some rapid palpitations. No chest pain.  Type II or unspecified type diabetes mellitus without mention of complication, not stated as uncontrolled: home glucose running in the 130's.  Edema: improved  Mucopurulent chronic bronchitis: under control currently  Palpitations: occasional rapid palpitation especially at night.  Nocturia: must rise every 3 hours. No dysuria     Medications: Patient's Medications  New Prescriptions   No medications on file  Previous Medications   ACETAMINOPHEN (TYLENOL) 325 MG TABLET    Take 2 tablets (650 mg total) by mouth every 6 (six) hours as needed for mild pain, moderate pain or headache.   BAYER CONTOUR TEST TEST STRIP    USE ONCE DAILY TO TEST BLOOD SUGAR.   CALCIUM CARBONATE (TUMS EX) 750 MG CHEWABLE TABLET    Chew 1 tablet by mouth 3 (three) times daily as needed for heartburn (after meals if needed for heartburn).    CETIRIZINE (ZYRTEC) 10 MG TABLET    Take 10 mg by mouth daily.   CLONIDINE (CATAPRES) 0.1 MG TABLET    Take 1 tablet for blood pressure greater than 160/100. May repeat in 1 hour if still greater than 160/100.   DICLOFENAC SODIUM (VOLTAREN) 1 % GEL    Apply one inch of gel  to painful knee four times daily   DOXAZOSIN (CARDURA) 4 MG TABLET    One daily to control BP   DOXEPIN (SINEQUAN) 10 MG CAPSULE    One nightly for rest   FUROSEMIDE (LASIX) 40 MG TABLET    Take one daily   GLIPIZIDE (GLUCOTROL) 5 MG TABLET    TAKE 2  TABLETS BEFORE BREAKFAST AND 2 TABLETS BEFORE SUPPER TO CONTROL DIABETES   GUAIFENESIN (MUCINEX) 600 MG 12 HR TABLET    Take 600 mg by mouth 2 (two) times daily.   LOSARTAN (COZAAR) 100 MG TABLET    Take 100 mg by mouth. Take one tablet daily for blood pressure   METFORMIN (GLUCOPHAGE) 500 MG TABLET    Take 1,000 mg by mouth 2 (two) times daily with a meal.   METOPROLOL (LOPRESSOR) 50 MG TABLET    One twice daily to control BP and heart rate   MULTIPLE VITAMIN (MULTIVITAMIN) CAPSULE    Take 1 capsule by mouth daily.   MULTIPLE VITAMINS-MINERALS (EYE VITAMINS PO)    Take by mouth daily. Take 1 tablet for eye health   OMEPRAZOLE (PRILOSEC) 40 MG CAPSULE    Take 40 mg by mouth daily.   PRAVASTATIN (PRAVACHOL) 20 MG TABLET    One daily to lower cholesterol   SIMETHICONE (GAS RELIEF 80 PO)    Take 1 tablet by mouth daily.    TRIAMCINOLONE CREAM (KENALOG) 0.1 %    Apply sparingly twice daily to the rash on the legs  Modified Medications   No  medications on file  Discontinued Medications   No medications on file     Review of Systems  Constitutional: Positive for activity change and fatigue. Negative for fever and unexpected weight change.  HENT: Positive for hearing loss (bilateral hearing aids).   Eyes: Positive for visual disturbance (corrective lenses).  Respiratory: Positive for cough and shortness of breath.   Cardiovascular: Positive for leg swelling. Negative for chest pain and palpitations.  Gastrointestinal: Negative.   Endocrine: Negative.        Diabetic.  Genitourinary:       Urinary leakage. History of infections.  Musculoskeletal: Positive for neck pain and neck stiffness.       Unsteady gait. Joint pains.  Skin:       Lesion of the left leg medial calf.  Neurological:       History of numbness and headaches. Unsteady gait.  Hematological: Negative.   Psychiatric/Behavioral:       Awakens in the middle of the night. Bartholome Bill trouble falling asleep.    Filed Vitals:    09/13/13 1011  BP: 150/64  Pulse: 88  Weight: 176 lb (79.833 kg)  SpO2: 93%   Body mass index is 30.2 kg/(m^2).  Physical Exam  Constitutional: She is oriented to person, place, and time. She appears well-developed and well-nourished. No distress.  HENT:  Head: Normocephalic and atraumatic.  Right Ear: External ear normal.  Left Ear: External ear normal.  Nose: Nose normal.  Eyes: Conjunctivae and EOM are normal. Pupils are equal, round, and reactive to light.  Neck: Neck supple. No JVD present. No tracheal deviation present. No thyromegaly present.  Cardiovascular: Normal rate, regular rhythm, normal heart sounds and intact distal pulses.  Exam reveals no gallop and no friction rub.   No murmur heard. Pulmonary/Chest: Breath sounds normal. No respiratory distress. She has no wheezes. She has no rales.  Abdominal: Bowel sounds are normal. She exhibits no distension and no mass. There is no tenderness.  Genitourinary:  Prior exam: Vaginal atrophy. Narrowing of introitus.  Musculoskeletal: She exhibits edema (2-3+ bipedal).  Scars from bilateral TKR. Right knee seems to be loose when stressing the joint. No effusion. Instable gait. Driving a power scooter today.  Lymphadenopathy:    She has no cervical adenopathy.  Neurological: She is alert and oriented to person, place, and time. She has normal reflexes. No cranial nerve deficit. Coordination normal.  Diminished vibratory in both feet.  Skin: Rash (Reddened rash on both legs. Some itching and burning associated with the rash.) noted. No erythema. No pallor.  Red rash of both forelegs.  Psychiatric: She has a normal mood and affect. Her behavior is normal. Judgment and thought content normal.     Labs reviewed: Nursing Home on 08/30/2013  Component Date Value Ref Range Status  . Hemoglobin 08/25/2013 13.3  12.0 - 16.0 g/dL Final  . HCT 08/25/2013 40  36 - 46 % Final  . Neutrophils Absolute 08/25/2013 5   Final  . Platelets  08/25/2013 267  150 - 399 K/L Final  . WBC 08/25/2013 7.3   Final  . Glucose 08/25/2013 155   Final  . BUN 08/25/2013 15  4 - 21 mg/dL Final  . Creatinine 08/25/2013 0.7  0.5 - 1.1 mg/dL Final  . Potassium 08/25/2013 4.0  3.4 - 5.3 mmol/L Final  . Sodium 08/25/2013 137  137 - 147 mmol/L Final  . Triglycerides 08/25/2013 215* 40 - 160 mg/dL Final  . Cholesterol 08/25/2013 123  0 -  200 mg/dL Final  . HDL 08/25/2013 54  35 - 70 mg/dL Final  . LDL Cholesterol 08/25/2013 26   Final  . Alkaline Phosphatase 08/25/2013 90  25 - 125 U/L Final  . ALT 08/25/2013 11  7 - 35 U/L Final  . AST 08/25/2013 15  13 - 35 U/L Final  . Bilirubin, Total 08/25/2013 0.3   Final  . Hemoglobin A1C 08/25/2013 7.2* 4.0 - 6.0 % Final      Assessment/Plan  1. Essential hypertension Continue losartan and metoprolol. Increase Cardura to 8 mg. Use clonidine prn. - doxazosin (CARDURA) 8 MG tablet; One daily to help control BP  Dispense: 30 tablet; Refill: 5  2. Type II or unspecified type diabetes mellitus without mention of complication, not stated as uncontrolled controlled  3. Edema uunchanged  4. Mucopurulent chronic bronchitis controlled  5. Palpitations observe  6. Nocturia observe

## 2013-09-15 ENCOUNTER — Other Ambulatory Visit: Payer: Self-pay | Admitting: Internal Medicine

## 2013-09-15 NOTE — Telephone Encounter (Signed)
Left message on voicemail for patient to return call when available, reason for call: Medication not on list, need to verify.

## 2013-09-27 ENCOUNTER — Other Ambulatory Visit: Payer: Self-pay | Admitting: Internal Medicine

## 2013-10-27 LAB — HEPATIC FUNCTION PANEL
ALK PHOS: 124 U/L (ref 25–125)
ALT: 13 U/L (ref 7–35)
AST: 17 U/L (ref 13–35)
Bilirubin, Total: 0.3 mg/dL

## 2013-10-27 LAB — LIPID PANEL
Cholesterol: 108 mg/dL (ref 0–200)
HDL: 54 mg/dL (ref 35–70)
LDL CALC: 33 mg/dL
TRIGLYCERIDES: 103 mg/dL (ref 40–160)

## 2013-10-27 LAB — TSH: TSH: 2.42 u[IU]/mL (ref 0.41–5.90)

## 2013-10-27 LAB — BASIC METABOLIC PANEL
BUN: 15 mg/dL (ref 4–21)
Creatinine: 0.6 mg/dL (ref 0.5–1.1)
Glucose: 137 mg/dL
Potassium: 4.5 mmol/L (ref 3.4–5.3)
Sodium: 139 mmol/L (ref 137–147)

## 2013-10-27 LAB — HEMOGLOBIN A1C: Hgb A1c MFr Bld: 7.2 % — AB (ref 4.0–6.0)

## 2013-11-01 ENCOUNTER — Encounter: Payer: Self-pay | Admitting: Internal Medicine

## 2013-11-01 ENCOUNTER — Non-Acute Institutional Stay: Payer: Medicare Other | Admitting: Internal Medicine

## 2013-11-01 VITALS — BP 132/62 | HR 72 | Wt 178.0 lb

## 2013-11-01 DIAGNOSIS — R220 Localized swelling, mass and lump, head: Secondary | ICD-10-CM

## 2013-11-01 DIAGNOSIS — E1165 Type 2 diabetes mellitus with hyperglycemia: Secondary | ICD-10-CM

## 2013-11-01 DIAGNOSIS — R221 Localized swelling, mass and lump, neck: Secondary | ICD-10-CM

## 2013-11-01 DIAGNOSIS — R609 Edema, unspecified: Secondary | ICD-10-CM

## 2013-11-01 DIAGNOSIS — I10 Essential (primary) hypertension: Secondary | ICD-10-CM

## 2013-11-01 DIAGNOSIS — E119 Type 2 diabetes mellitus without complications: Secondary | ICD-10-CM

## 2013-11-01 DIAGNOSIS — E785 Hyperlipidemia, unspecified: Secondary | ICD-10-CM

## 2013-11-07 DIAGNOSIS — R221 Localized swelling, mass and lump, neck: Secondary | ICD-10-CM | POA: Insufficient documentation

## 2013-11-07 HISTORY — DX: Localized swelling, mass and lump, neck: R22.1

## 2013-11-07 NOTE — Progress Notes (Signed)
Patient ID: [name], female   DOB: December 21, 1917, 80 y.o.   MRN: [id number]    [facility]     Place of Service: Clinic (12)      Allergies  Allergen Reactions  . Adhesive [Tape]     unknown  . Aspirin     unknown  . Codeine     unknown  . Enalapril Cough  . Hydrocodone     unknown  . Pantoprazole Sodium     unknown    Chief Complaint  Patient presents with  . Medical Management of Chronic Issues    blood pressure, blood sugar.  . Urinary Frequency    and can't control urine for 6 weeks    HPI:  Type 2 diabetes mellitus with hyperglycemia: adequately controlled  Essential hypertension:L controlled  Hyperlipidemia: controlled  Edema: unchanged  Nodule of neck: left posterior neck at the SCM muscle    Medications: Patient's Medications  New Prescriptions   No medications on file  Previous Medications   ACETAMINOPHEN (TYLENOL) 325 MG TABLET    Take 2 tablets (650 mg total) by mouth every 6 (six) hours as needed for mild pain, moderate pain or headache.   BAYER CONTOUR TEST TEST STRIP    USE ONCE DAILY TO TEST BLOOD SUGAR.   CALCIUM CARBONATE (TUMS EX) 750 MG CHEWABLE TABLET    Chew 1 tablet by mouth 3 (three) times daily as needed for heartburn (after meals if needed for heartburn).    CETIRIZINE (ZYRTEC) 10 MG TABLET    Take 10 mg by mouth daily.   CLONIDINE (CATAPRES) 0.1 MG TABLET    Take 1 tablet for blood pressure greater than 160/100. May repeat in 1 hour if still greater than 160/100.   DICLOFENAC SODIUM (VOLTAREN) 1 % GEL    Apply one inch of gel  to painful knee four times daily   DOXAZOSIN (CARDURA) 8 MG TABLET    One daily to help control BP   DOXEPIN (SINEQUAN) 10 MG CAPSULE    One nightly for rest   FUROSEMIDE (LASIX) 40 MG TABLET    Take one daily   GLIPIZIDE (GLUCOTROL) 5 MG TABLET    TAKE 2 TABLETS BEFORE BREAKFAST AND 2 TABLETS BEFORE SUPPER TO CONTROL DIABETES   GUAIFENESIN (MUCINEX) 600 MG 12 HR TABLET    Take 600 mg by  mouth 2 (two) times daily.   LOSARTAN (COZAAR) 100 MG TABLET    Take 100 mg by mouth. Take one tablet daily for blood pressure   METFORMIN (GLUCOPHAGE) 500 MG TABLET    Take 1,000 mg by mouth 2 (two) times daily with a meal.   METOPROLOL (LOPRESSOR) 50 MG TABLET    One twice daily to control BP and heart rate   MULTIPLE VITAMIN (MULTIVITAMIN) CAPSULE    Take 1 capsule by mouth daily.   MULTIPLE VITAMINS-MINERALS (EYE VITAMINS PO)    Take by mouth daily. Take 1 tablet for eye health   OMEPRAZOLE (PRILOSEC) 40 MG CAPSULE    Take 40 mg by mouth daily.   PRAVASTATIN (PRAVACHOL) 20 MG TABLET    One daily to lower cholesterol   SIMETHICONE (GAS RELIEF 80 PO)    Take 1 tablet by mouth daily.    TRIAMCINOLONE CREAM (KENALOG) 0.1 %    Apply sparingly twice daily to the rash on the legs  Modified Medications   No medications on file  Discontinued Medications   No medications on file  Review of Systems  Constitutional: Positive for activity change and fatigue. Negative for fever and unexpected weight change.  HENT: Positive for hearing loss (bilateral hearing aids).   Eyes: Positive for visual disturbance (corrective lenses).  Respiratory: Positive for cough and shortness of breath.   Cardiovascular: Positive for leg swelling. Negative for chest pain and palpitations.  Gastrointestinal: Negative.   Endocrine: Negative.        Diabetic.  Genitourinary:       Urinary leakage. History of infections.  Musculoskeletal: Positive for neck pain and neck stiffness.       Unsteady gait. Joint pains. Using powerscooter.  Skin:       Lesion of the left leg medial calf.  Neurological:       History of numbness and headaches. Unsteady gait.  Hematological: Negative.   Psychiatric/Behavioral:       Awakens in the middle of the night. Bartholome Bill trouble falling asleep.    Filed Vitals:   11/01/13 0918  BP: 132/62  Pulse: 72  Weight: 178 lb (80.74 kg)  SpO2: 97%   Body mass index is 30.54  kg/(m^2).  Physical Exam  Constitutional: She is oriented to person, place, and time. She appears well-developed and well-nourished. No distress.  HENT:  Head: Normocephalic and atraumatic.  Right Ear: External ear normal.  Left Ear: External ear normal.  Nose: Nose normal.  Eyes: Conjunctivae and EOM are normal. Pupils are equal, round, and reactive to light.  Neck: Neck supple. No JVD present. No tracheal deviation present. No thyromegaly present.  Cardiovascular: Normal rate, regular rhythm, normal heart sounds and intact distal pulses.  Exam reveals no gallop and no friction rub.   No murmur heard. Pulmonary/Chest: Breath sounds normal. No respiratory distress. She has no wheezes. She has no rales.  Abdominal: Bowel sounds are normal. She exhibits no distension and no mass. There is no tenderness.  Genitourinary:  Prior exam: Vaginal atrophy. Narrowing of introitus.  Musculoskeletal: She exhibits edema (2-3+ bipedal).  Scars from bilateral TKR. Right knee seems to be loose when stressing the joint. No effusion. Instable gait. Driving a power scooter today.  Lymphadenopathy:    She has no cervical adenopathy.  Neurological: She is alert and oriented to person, place, and time. She has normal reflexes. No cranial nerve deficit. Coordination normal.  Diminished vibratory and monofilament testing in both feet.  Skin: Rash (Reddened rash on both legs. Some itching and burning associated with the rash.) noted. No erythema. No pallor.  Red rash of both forelegs. 6 mm nodule of the left posterior neck at the SCM muscle. Not tender.  Psychiatric: She has a normal mood and affect. Her behavior is normal. Judgment and thought content normal.     Labs reviewed: Nursing Home on 11/01/2013  Component Date Value Ref Range Status  . Glucose 10/27/2013 137   Final  . BUN 10/27/2013 15  4 - 21 mg/dL Final  . Creatinine 10/27/2013 0.6  0.5 - 1.1 mg/dL Final  . Potassium 10/27/2013 4.5  3.4 -  5.3 mmol/L Final  . Sodium 10/27/2013 139  137 - 147 mmol/L Final  . Triglycerides 10/27/2013 103  40 - 160 mg/dL Final  . Cholesterol 10/27/2013 108  0 - 200 mg/dL Final  . HDL 10/27/2013 54  35 - 70 mg/dL Final  . LDL Cholesterol 10/27/2013 33   Final  . Alkaline Phosphatase 10/27/2013 124  25 - 125 U/L Final  . ALT 10/27/2013 13  7 - 35 U/L Final  .  AST 10/27/2013 17  13 - 35 U/L Final  . Bilirubin, Total 10/27/2013 0.3   Final  . Hemoglobin A1C 10/27/2013 7.2* 4.0 - 6.0 % Final  . TSH 10/27/2013 2.42  0.41 - 5.90 uIU/mL Final  Nursing Home on 08/30/2013  Component Date Value Ref Range Status  . Hemoglobin 08/25/2013 13.3  12.0 - 16.0 g/dL Final  . HCT 08/25/2013 40  36 - 46 % Final  . Neutrophils Absolute 08/25/2013 5   Final  . Platelets 08/25/2013 267  150 - 399 K/L Final  . WBC 08/25/2013 7.3   Final  . Glucose 08/25/2013 155   Final  . BUN 08/25/2013 15  4 - 21 mg/dL Final  . Creatinine 08/25/2013 0.7  0.5 - 1.1 mg/dL Final  . Potassium 08/25/2013 4.0  3.4 - 5.3 mmol/L Final  . Sodium 08/25/2013 137  137 - 147 mmol/L Final  . Triglycerides 08/25/2013 215* 40 - 160 mg/dL Final  . Cholesterol 08/25/2013 123  0 - 200 mg/dL Final  . HDL 08/25/2013 54  35 - 70 mg/dL Final  . LDL Cholesterol 08/25/2013 26   Final  . Alkaline Phosphatase 08/25/2013 90  25 - 125 U/L Final  . ALT 08/25/2013 11  7 - 35 U/L Final  . AST 08/25/2013 15  13 - 35 U/L Final  . Bilirubin, Total 08/25/2013 0.3   Final  . Hemoglobin A1C 08/25/2013 7.2* 4.0 - 6.0 % Final     Assessment/Plan  1. Type 2 diabetes mellitus with hyperglycemia continue current tx  2. Essential hypertension controlled  3. Hyperlipidemia controlled  4. Edema imroved  5. Nodule of neck Uncertain etiology. Observe.

## 2013-11-11 ENCOUNTER — Other Ambulatory Visit: Payer: Self-pay | Admitting: Internal Medicine

## 2013-11-12 ENCOUNTER — Inpatient Hospital Stay (HOSPITAL_COMMUNITY)
Admission: EM | Admit: 2013-11-12 | Discharge: 2013-11-17 | DRG: 871 | Disposition: A | Discharge status: Skilled Nursing Facility | Payer: Medicare Other | Attending: Internal Medicine | Admitting: Internal Medicine

## 2013-11-12 ENCOUNTER — Emergency Department (HOSPITAL_COMMUNITY): Payer: Medicare Other

## 2013-11-12 ENCOUNTER — Encounter (HOSPITAL_COMMUNITY): Payer: Self-pay | Admitting: Emergency Medicine

## 2013-11-12 DIAGNOSIS — N393 Stress incontinence (female) (male): Secondary | ICD-10-CM

## 2013-11-12 DIAGNOSIS — Y95 Nosocomial condition: Secondary | ICD-10-CM | POA: Diagnosis present

## 2013-11-12 DIAGNOSIS — A4150 Gram-negative sepsis, unspecified: Secondary | ICD-10-CM | POA: Diagnosis present

## 2013-11-12 DIAGNOSIS — E119 Type 2 diabetes mellitus without complications: Secondary | ICD-10-CM | POA: Diagnosis present

## 2013-11-12 DIAGNOSIS — R351 Nocturia: Secondary | ICD-10-CM

## 2013-11-12 DIAGNOSIS — E222 Syndrome of inappropriate secretion of antidiuretic hormone: Secondary | ICD-10-CM | POA: Diagnosis present

## 2013-11-12 DIAGNOSIS — R0902 Hypoxemia: Secondary | ICD-10-CM

## 2013-11-12 DIAGNOSIS — Z66 Do not resuscitate: Secondary | ICD-10-CM | POA: Diagnosis present

## 2013-11-12 DIAGNOSIS — Z96653 Presence of artificial knee joint, bilateral: Secondary | ICD-10-CM | POA: Diagnosis present

## 2013-11-12 DIAGNOSIS — I2510 Atherosclerotic heart disease of native coronary artery without angina pectoris: Secondary | ICD-10-CM | POA: Diagnosis present

## 2013-11-12 DIAGNOSIS — T380X5A Adverse effect of glucocorticoids and synthetic analogues, initial encounter: Secondary | ICD-10-CM | POA: Diagnosis present

## 2013-11-12 DIAGNOSIS — R21 Rash and other nonspecific skin eruption: Secondary | ICD-10-CM | POA: Diagnosis not present

## 2013-11-12 DIAGNOSIS — I4891 Unspecified atrial fibrillation: Secondary | ICD-10-CM | POA: Diagnosis present

## 2013-11-12 DIAGNOSIS — Z87891 Personal history of nicotine dependence: Secondary | ICD-10-CM

## 2013-11-12 DIAGNOSIS — J9601 Acute respiratory failure with hypoxia: Secondary | ICD-10-CM

## 2013-11-12 DIAGNOSIS — Z800 Family history of malignant neoplasm of digestive organs: Secondary | ICD-10-CM

## 2013-11-12 DIAGNOSIS — R0602 Shortness of breath: Secondary | ICD-10-CM | POA: Diagnosis not present

## 2013-11-12 DIAGNOSIS — D509 Iron deficiency anemia, unspecified: Secondary | ICD-10-CM

## 2013-11-12 DIAGNOSIS — D649 Anemia, unspecified: Secondary | ICD-10-CM | POA: Diagnosis present

## 2013-11-12 DIAGNOSIS — E785 Hyperlipidemia, unspecified: Secondary | ICD-10-CM | POA: Diagnosis present

## 2013-11-12 DIAGNOSIS — R609 Edema, unspecified: Secondary | ICD-10-CM

## 2013-11-12 DIAGNOSIS — Z8249 Family history of ischemic heart disease and other diseases of the circulatory system: Secondary | ICD-10-CM | POA: Diagnosis not present

## 2013-11-12 DIAGNOSIS — J189 Pneumonia, unspecified organism: Secondary | ICD-10-CM | POA: Diagnosis present

## 2013-11-12 DIAGNOSIS — A419 Sepsis, unspecified organism: Principal | ICD-10-CM | POA: Diagnosis present

## 2013-11-12 DIAGNOSIS — I10 Essential (primary) hypertension: Secondary | ICD-10-CM | POA: Diagnosis present

## 2013-11-12 DIAGNOSIS — N390 Urinary tract infection, site not specified: Secondary | ICD-10-CM | POA: Diagnosis present

## 2013-11-12 DIAGNOSIS — N10 Acute tubulo-interstitial nephritis: Secondary | ICD-10-CM

## 2013-11-12 DIAGNOSIS — J441 Chronic obstructive pulmonary disease with (acute) exacerbation: Secondary | ICD-10-CM

## 2013-11-12 DIAGNOSIS — H9190 Unspecified hearing loss, unspecified ear: Secondary | ICD-10-CM | POA: Diagnosis present

## 2013-11-12 DIAGNOSIS — Z8711 Personal history of peptic ulcer disease: Secondary | ICD-10-CM

## 2013-11-12 DIAGNOSIS — K219 Gastro-esophageal reflux disease without esophagitis: Secondary | ICD-10-CM | POA: Diagnosis present

## 2013-11-12 DIAGNOSIS — J440 Chronic obstructive pulmonary disease with acute lower respiratory infection: Secondary | ICD-10-CM | POA: Diagnosis present

## 2013-11-12 DIAGNOSIS — R0609 Other forms of dyspnea: Secondary | ICD-10-CM

## 2013-11-12 DIAGNOSIS — B370 Candidal stomatitis: Secondary | ICD-10-CM | POA: Diagnosis present

## 2013-11-12 DIAGNOSIS — R221 Localized swelling, mass and lump, neck: Secondary | ICD-10-CM

## 2013-11-12 DIAGNOSIS — M1990 Unspecified osteoarthritis, unspecified site: Secondary | ICD-10-CM

## 2013-11-12 DIAGNOSIS — R918 Other nonspecific abnormal finding of lung field: Secondary | ICD-10-CM

## 2013-11-12 DIAGNOSIS — R0600 Dyspnea, unspecified: Secondary | ICD-10-CM

## 2013-11-12 DIAGNOSIS — R002 Palpitations: Secondary | ICD-10-CM

## 2013-11-12 DIAGNOSIS — J411 Mucopurulent chronic bronchitis: Secondary | ICD-10-CM

## 2013-11-12 DIAGNOSIS — R509 Fever, unspecified: Secondary | ICD-10-CM

## 2013-11-12 LAB — BLOOD GAS, ARTERIAL
Acid-base deficit: 0.9 mmol/L (ref 0.0–2.0)
Allens test (pass/fail): POSITIVE — AB
Bicarbonate: 24 mEq/L (ref 20.0–24.0)
Drawn by: 422461
O2 Content: 4 L/min
O2 Saturation: 95.5 %
Patient temperature: 97.9
TCO2: 22 mmol/L (ref 0–100)
pCO2 arterial: 42.4 mmHg (ref 35.0–45.0)
pH, Arterial: 7.369 (ref 7.350–7.450)
pO2, Arterial: 112 mmHg — ABNORMAL HIGH (ref 80.0–100.0)

## 2013-11-12 LAB — COMPREHENSIVE METABOLIC PANEL
ALT: 11 U/L (ref 0–35)
ANION GAP: 16 — AB (ref 5–15)
AST: 15 U/L (ref 0–37)
Albumin: 3.5 g/dL (ref 3.5–5.2)
Alkaline Phosphatase: 85 U/L (ref 39–117)
BUN: 14 mg/dL (ref 6–23)
CALCIUM: 8.9 mg/dL (ref 8.4–10.5)
CO2: 24 meq/L (ref 19–32)
Chloride: 96 mEq/L (ref 96–112)
Creatinine, Ser: 0.55 mg/dL (ref 0.50–1.10)
GFR, EST NON AFRICAN AMERICAN: 78 mL/min — AB (ref >90)
GLUCOSE: 203 mg/dL — AB (ref 70–99)
Potassium: 3.9 mEq/L (ref 3.7–5.3)
Sodium: 136 mEq/L — ABNORMAL LOW (ref 137–147)
Total Bilirubin: 0.5 mg/dL (ref 0.3–1.2)
Total Protein: 6.8 g/dL (ref 6.0–8.3)

## 2013-11-12 LAB — URINE MICROSCOPIC-ADD ON

## 2013-11-12 LAB — URINALYSIS, ROUTINE W REFLEX MICROSCOPIC
Bilirubin Urine: NEGATIVE
Glucose, UA: NEGATIVE mg/dL
Ketones, ur: >80 mg/dL — AB
NITRITE: POSITIVE — AB
PROTEIN: 100 mg/dL — AB
Specific Gravity, Urine: 1.019 (ref 1.005–1.030)
UROBILINOGEN UA: 0.2 mg/dL (ref 0.0–1.0)
pH: 5.5 (ref 5.0–8.0)

## 2013-11-12 LAB — CBC WITH DIFFERENTIAL/PLATELET
Basophils Absolute: 0 10*3/uL (ref 0.0–0.1)
Basophils Relative: 0 % (ref 0–1)
EOS PCT: 0 % (ref 0–5)
Eosinophils Absolute: 0 10*3/uL (ref 0.0–0.7)
HCT: 37 % (ref 36.0–46.0)
Hemoglobin: 12.1 g/dL (ref 12.0–15.0)
LYMPHS PCT: 2 % — AB (ref 12–46)
Lymphs Abs: 0.4 10*3/uL — ABNORMAL LOW (ref 0.7–4.0)
MCH: 31.2 pg (ref 26.0–34.0)
MCHC: 32.7 g/dL (ref 30.0–36.0)
MCV: 95.4 fL (ref 78.0–100.0)
Monocytes Absolute: 1.5 10*3/uL — ABNORMAL HIGH (ref 0.1–1.0)
Monocytes Relative: 7 % (ref 3–12)
Neutro Abs: 20.1 10*3/uL — ABNORMAL HIGH (ref 1.7–7.7)
Neutrophils Relative %: 91 % — ABNORMAL HIGH (ref 43–77)
Platelets: 179 10*3/uL (ref 150–400)
RBC: 3.88 MIL/uL (ref 3.87–5.11)
RDW: 13.8 % (ref 11.5–15.5)
WBC: 22.1 10*3/uL — ABNORMAL HIGH (ref 4.0–10.5)

## 2013-11-12 LAB — GLUCOSE, CAPILLARY
Glucose-Capillary: 172 mg/dL — ABNORMAL HIGH (ref 70–99)
Glucose-Capillary: 180 mg/dL — ABNORMAL HIGH (ref 70–99)

## 2013-11-12 LAB — HIV ANTIBODY (ROUTINE TESTING W REFLEX): HIV: NONREACTIVE

## 2013-11-12 LAB — I-STAT CG4 LACTIC ACID, ED: Lactic Acid, Venous: 1.49 mmol/L (ref 0.5–2.2)

## 2013-11-12 LAB — TROPONIN I
Troponin I: <0.3 ng/mL (ref <0.30)
Troponin I: <0.3 ng/mL (ref <0.30)
Troponin I: <0.3 ng/mL (ref <0.30)

## 2013-11-12 LAB — MRSA PCR SCREENING: MRSA by PCR: NEGATIVE

## 2013-11-12 LAB — STREP PNEUMONIAE URINARY ANTIGEN: STREP PNEUMO URINARY ANTIGEN: NEGATIVE

## 2013-11-12 MED ORDER — ENOXAPARIN SODIUM 40 MG/0.4ML ~~LOC~~ SOLN
40.0000 mg | SUBCUTANEOUS | Status: DC
  Administered 2013-11-12 – 2013-11-16 (×5): 40 mg via SUBCUTANEOUS
  Filled 2013-11-12 (×6): qty 0.4

## 2013-11-12 MED ORDER — METHYLPREDNISOLONE SODIUM SUCC 125 MG IJ SOLR: 80.0000 mg | Freq: Two times a day (BID) | INTRAMUSCULAR | Status: DC

## 2013-11-12 MED ORDER — OMEPRAZOLE 20 MG PO CPDR
40.0000 mg | DELAYED_RELEASE_CAPSULE | Freq: Every day | ORAL | Status: DC
  Filled 2013-11-12 (×2): qty 2

## 2013-11-12 MED ORDER — LOSARTAN POTASSIUM 50 MG PO TABS
100.0000 mg | ORAL_TABLET | Freq: Every day | ORAL | Status: DC
  Administered 2013-11-13 – 2013-11-17 (×5): 100 mg via ORAL
  Filled 2013-11-12 (×6): qty 2

## 2013-11-12 MED ORDER — VANCOMYCIN HCL 10 G IV SOLR
1250.0000 mg | INTRAVENOUS | Status: AC
  Administered 2013-11-12: 1250 mg via INTRAVENOUS
  Filled 2013-11-12: qty 1250

## 2013-11-12 MED ORDER — METHYLPREDNISOLONE SODIUM SUCC 125 MG IJ SOLR
80.0000 mg | Freq: Two times a day (BID) | INTRAMUSCULAR | Status: DC
Start: 2013-11-12 — End: 2013-11-15
  Administered 2013-11-12 – 2013-11-15 (×6): 80 mg via INTRAVENOUS
  Filled 2013-11-12: qty 1.28
  Filled 2013-11-12: qty 2
  Filled 2013-11-12: qty 1.28
  Filled 2013-11-12: qty 2
  Filled 2013-11-12 (×2): qty 1.28
  Filled 2013-11-12 (×2): qty 2

## 2013-11-12 MED ORDER — DOXEPIN HCL 10 MG PO CAPS
10.0000 mg | ORAL_CAPSULE | Freq: Every evening | ORAL | Status: DC | PRN
  Administered 2013-11-13 – 2013-11-16 (×3): 10 mg via ORAL
  Filled 2013-11-12 (×3): qty 1

## 2013-11-12 MED ORDER — METOPROLOL TARTRATE 1 MG/ML IV SOLN
5.0000 mg | Freq: Four times a day (QID) | INTRAVENOUS | Status: DC | PRN
  Filled 2013-11-12: qty 5

## 2013-11-12 MED ORDER — ACETAMINOPHEN 325 MG PO TABS
650.0000 mg | ORAL_TABLET | Freq: Four times a day (QID) | ORAL | Status: DC | PRN
  Administered 2013-11-12: 650 mg via ORAL
  Filled 2013-11-12: qty 2

## 2013-11-12 MED ORDER — PIPERACILLIN-TAZOBACTAM 3.375 G IVPB
3.3750 g | Freq: Once | INTRAVENOUS | Status: AC
  Administered 2013-11-12: 3.375 g via INTRAVENOUS
  Filled 2013-11-12: qty 50

## 2013-11-12 MED ORDER — LORATADINE 10 MG PO TABS
10.0000 mg | ORAL_TABLET | Freq: Every day | ORAL | Status: DC
  Administered 2013-11-13 – 2013-11-17 (×5): 10 mg via ORAL
  Filled 2013-11-12 (×6): qty 1

## 2013-11-12 MED ORDER — IPRATROPIUM BROMIDE 0.02 % IN SOLN
0.5000 mg | RESPIRATORY_TRACT | Status: DC
  Administered 2013-11-12 – 2013-11-15 (×21): 0.5 mg via RESPIRATORY_TRACT
  Filled 2013-11-12 (×21): qty 2.5

## 2013-11-12 MED ORDER — METOPROLOL TARTRATE 1 MG/ML IV SOLN
5.0000 mg | Freq: Four times a day (QID) | INTRAVENOUS | Status: DC
  Administered 2013-11-12: 5 mg via INTRAVENOUS
  Filled 2013-11-12: qty 5

## 2013-11-12 MED ORDER — GUAIFENESIN-DM 100-10 MG/5ML PO SYRP
5.0000 mL | ORAL_SOLUTION | ORAL | Status: DC | PRN
  Administered 2013-11-12 – 2013-11-16 (×7): 5 mL via ORAL
  Filled 2013-11-12 (×8): qty 10

## 2013-11-12 MED ORDER — LEVALBUTEROL HCL 1.25 MG/0.5ML IN NEBU
1.2500 mg | INHALATION_SOLUTION | RESPIRATORY_TRACT | Status: DC
  Administered 2013-11-12 – 2013-11-15 (×21): 1.25 mg via RESPIRATORY_TRACT
  Filled 2013-11-12 (×24): qty 0.5

## 2013-11-12 MED ORDER — INSULIN ASPART 100 UNIT/ML ~~LOC~~ SOLN
0.0000 [IU] | Freq: Every day | SUBCUTANEOUS | Status: DC
  Administered 2013-11-12: 3 [IU] via SUBCUTANEOUS
  Administered 2013-11-13: 2 [IU] via SUBCUTANEOUS

## 2013-11-12 MED ORDER — BISOPROLOL FUMARATE 10 MG PO TABS
10.0000 mg | ORAL_TABLET | Freq: Every day | ORAL | Status: DC
  Administered 2013-11-13 – 2013-11-17 (×5): 10 mg via ORAL
  Filled 2013-11-12: qty 2
  Filled 2013-11-12: qty 1
  Filled 2013-11-12: qty 2
  Filled 2013-11-12 (×2): qty 1

## 2013-11-12 MED ORDER — CALCIUM CARBONATE ANTACID 750 MG PO CHEW: 2.0000 | CHEWABLE_TABLET | Freq: Three times a day (TID) | ORAL | Status: DC | PRN

## 2013-11-12 MED ORDER — CLONIDINE HCL 0.1 MG PO TABS
0.1000 mg | ORAL_TABLET | Freq: Three times a day (TID) | ORAL | Status: DC | PRN
  Administered 2013-11-14: 0.1 mg via ORAL
  Filled 2013-11-12: qty 1

## 2013-11-12 MED ORDER — DICLOFENAC SODIUM 1 % TD GEL
4.0000 g | Freq: Four times a day (QID) | TRANSDERMAL | Status: DC | PRN
  Filled 2013-11-12: qty 100

## 2013-11-12 MED ORDER — CALCIUM CARBONATE ANTACID 500 MG PO CHEW: 3.0000 | CHEWABLE_TABLET | Freq: Three times a day (TID) | ORAL | Status: DC | PRN

## 2013-11-12 MED ORDER — AMLODIPINE BESYLATE 5 MG PO TABS
5.0000 mg | ORAL_TABLET | Freq: Every day | ORAL | Status: DC
  Administered 2013-11-13: 5 mg via ORAL
  Filled 2013-11-12: qty 1

## 2013-11-12 MED ORDER — SIMVASTATIN 20 MG PO TABS
20.0000 mg | ORAL_TABLET | Freq: Every day | ORAL | Status: DC
  Administered 2013-11-12 – 2013-11-16 (×5): 20 mg via ORAL
  Filled 2013-11-12 (×2): qty 1
  Filled 2013-11-12: qty 2
  Filled 2013-11-12 (×3): qty 1

## 2013-11-12 MED ORDER — SODIUM CHLORIDE 0.9 % IV SOLN
INTRAVENOUS | Status: DC
  Administered 2013-11-12 – 2013-11-13 (×2): via INTRAVENOUS

## 2013-11-12 MED ORDER — ADULT MULTIVITAMIN W/MINERALS CH
1.0000 | ORAL_TABLET | Freq: Every day | ORAL | Status: DC
  Administered 2013-11-13 – 2013-11-17 (×5): 1 via ORAL
  Filled 2013-11-12 (×6): qty 1

## 2013-11-12 MED ORDER — VANCOMYCIN HCL IN DEXTROSE 750-5 MG/150ML-% IV SOLN
750.0000 mg | Freq: Two times a day (BID) | INTRAVENOUS | Status: DC
  Administered 2013-11-13 (×3): 750 mg via INTRAVENOUS
  Filled 2013-11-12 (×4): qty 150

## 2013-11-12 MED ORDER — PIPERACILLIN-TAZOBACTAM 3.375 G IVPB
3.3750 g | Freq: Three times a day (TID) | INTRAVENOUS | Status: DC
  Administered 2013-11-12 – 2013-11-15 (×9): 3.375 g via INTRAVENOUS
  Filled 2013-11-12 (×10): qty 50

## 2013-11-12 MED ORDER — BISOPROLOL FUMARATE 10 MG PO TABS
10.0000 mg | ORAL_TABLET | Freq: Every day | ORAL | Status: DC
  Filled 2013-11-12: qty 1

## 2013-11-12 MED ORDER — INSULIN ASPART 100 UNIT/ML ~~LOC~~ SOLN
0.0000 [IU] | Freq: Three times a day (TID) | SUBCUTANEOUS | Status: DC
  Administered 2013-11-12: 2 [IU] via SUBCUTANEOUS
  Administered 2013-11-13: 9 [IU] via SUBCUTANEOUS
  Administered 2013-11-13: 3 [IU] via SUBCUTANEOUS

## 2013-11-12 MED ORDER — SODIUM CHLORIDE 0.9 % IV BOLUS (SEPSIS)
30.0000 mL/kg | Freq: Once | INTRAVENOUS | Status: AC
  Administered 2013-11-12: 2421 mL via INTRAVENOUS

## 2013-11-12 MED ORDER — DOXAZOSIN MESYLATE 8 MG PO TABS
8.0000 mg | ORAL_TABLET | Freq: Every day | ORAL | Status: DC
  Administered 2013-11-12 – 2013-11-17 (×6): 8 mg via ORAL
  Filled 2013-11-12 (×6): qty 1

## 2013-11-12 NOTE — Progress Notes (Signed)
Upon initial assessment, patient difficult to arouse, responding only to pain. Was reported patient to be A&O. Skin clammy, but vital signs stable. Patient awakes to pain but unable to stay awake. CBG 265. Charge RN aware and at bedside. ABG requested. [facility] floor coverage paged. Elink also camered in. No new orders placed. Will continue to monitor.

## 2013-11-12 NOTE — ED Notes (Signed)
Pt lpm [place] CHANGED to 2 lpm [place].

## 2013-11-12 NOTE — ED Provider Notes (Signed)
CSN: [id number]     Arrival date & time 11/12/13  0816 History   First MD Initiated Contact with Patient 11/12/13 0831     Chief Complaint  Patient presents with  . Urinary Tract Infection     HPI Patient presents to the emergency department with a fever or 101.6. She reports urinary frequency over the past 12 hours. She also reports new discomfort under her left breast. She states some discomfort with breathing. No recent cough. History of COPD. oxygen saturation on arrival was 88% but improved with supplemental oxygen Placed on oxygen. Denies abdominal pain but denies back pain or flank pain. No dysuria. No diarrhea. Denies nausea or vomiting.   Past Medical History  Diagnosis Date  . Type II or unspecified type diabetes mellitus without mention of complication, not stated as uncontrolled   . Hypertension   . Arthritis   . Ulcer   . COPD (chronic obstructive pulmonary disease)   . Regional enteritis of unspecified site   . Other malaise and fatigue   . Anemia, unspecified   . Other and unspecified hyperlipidemia   . Hypoxemia   . Pneumonia, organism unspecified   . Mucopurulent chronic bronchitis   . Palpitations   . Nontoxic uninodular goiter   . Abnormality of gait   . Fall from other slipping, tripping, or stumbling   . Cholelithiases   . Closed fracture of unspecified part of ramus of mandible   . Disturbance of skin sensation   . Sciatica   . Tension headache   . Coronary atherosclerosis of unspecified type of vessel, native or graft   . Osteoarthrosis, unspecified whether generalized or localized, unspecified site   . Insomnia, unspecified   . Edema   . Tension headache   . Pain in joint, lower leg 2010    right knee  . Unspecified venous (peripheral) insufficiency   . Esophageal reflux   . Rosacea   . Cataract 1990    Dr. Katy Fitch  . Pain in joint, shoulder region 2013    left  . Gastric ulcer with hemorrhage 2008   Past Surgical History  Procedure  Laterality Date  . Total knee arthroplasty Bilateral 1993, 1998    knees  . Other surgical history  2008    Ulcer surgery   . Small intestine surgery    . Eye surgery Bilateral 1990    Dr. Katy Fitch  . Gastrojejunostomy  05/2004    secondary to ulcer  . Joint replacement Bilateral 1993-1998    total knee replacement   Family History  Problem Relation Age of Onset  . Heart disease Father   . Cancer Sister   . Diabetes Brother   . Obesity Brother   . Mental retardation Daughter   . Obesity Daughter    History  Substance Use Topics  . Smoking status: Former Smoker -- 0.30 packs/day for 10 years    Types: Cigarettes    Quit date: 02/03/1973  . Smokeless tobacco: Never Used     Comment: former casual smoker  . Alcohol Use: No   OB History   Grav Para Term Preterm Abortions TAB SAB Ect Mult Living                 Review of Systems  All other systems reviewed and are negative.     Allergies  Adhesive; Aspirin; Codeine; Enalapril; Hydrocodone; and Pantoprazole sodium  Home Medications   Prior to Admission medications   Medication Sig Start Date End Date  Taking? Authorizing Provider  acetaminophen (TYLENOL) 325 MG tablet Take 2 tablets (650 mg total) by mouth every 6 (six) hours as needed for mild pain, moderate pain or headache. 02/27/13   Jennifer L Piepenbrink, PA-C  BAYER CONTOUR TEST test strip USE ONCE DAILY TO TEST BLOOD SUGAR.    Tiffany L Reed, DO  calcium carbonate (TUMS EX) 750 MG chewable tablet Chew 1 tablet by mouth 3 (three) times daily as needed for heartburn (after meals if needed for heartburn).     Historical Provider, MD  cetirizine (ZYRTEC) 10 MG tablet Take 10 mg by mouth daily.    Historical Provider, MD  cloNIDine (CATAPRES) 0.1 MG tablet Take 1 tablet for blood pressure greater than 160/100. May repeat in 1 hour if still greater than 160/100. 08/18/13   Estill Dooms, MD  diclofenac sodium (VOLTAREN) 1 % GEL Apply one inch of gel  to painful knee four  times daily 04/19/13   Estill Dooms, MD  doxazosin (CARDURA) 8 MG tablet One daily to help control BP 09/13/13   Estill Dooms, MD  doxepin (SINEQUAN) 10 MG capsule TAKE ONE CAPSULE BY MOUTH AT BEDTIME AS NEEDED FOR REST 11/11/13   Tiffany L Reed, DO  furosemide (LASIX) 40 MG tablet Take one daily 08/22/13   Historical Provider, MD  glipiZIDE (GLUCOTROL) 5 MG tablet TAKE 2 TABLETS BEFORE BREAKFAST AND 2 TABLETS BEFORE SUPPER TO CONTROL DIABETES 09/27/13   Estill Dooms, MD  guaiFENesin (MUCINEX) 600 MG 12 hr tablet Take 600 mg by mouth 2 (two) times daily.    Historical Provider, MD  losartan (COZAAR) 100 MG tablet Take 100 mg by mouth. Take one tablet daily for blood pressure    Historical Provider, MD  metFORMIN (GLUCOPHAGE) 500 MG tablet Take 1,000 mg by mouth 2 (two) times daily with a meal.    Historical Provider, MD  metoprolol (LOPRESSOR) 50 MG tablet One twice daily to control BP and heart rate 08/23/13   Estill Dooms, MD  Multiple Vitamin (MULTIVITAMIN) capsule Take 1 capsule by mouth daily.    Historical Provider, MD  Multiple Vitamins-Minerals (EYE VITAMINS PO) Take by mouth daily. Take 1 tablet for eye health    Historical Provider, MD  omeprazole (PRILOSEC) 40 MG capsule Take 40 mg by mouth daily.    Historical Provider, MD  pravastatin (PRAVACHOL) 20 MG tablet One daily to lower cholesterol 07/19/13   Estill Dooms, MD  Simethicone (GAS RELIEF 80 PO) Take 1 tablet by mouth daily.     Historical Provider, MD  triamcinolone cream (KENALOG) 0.1 % Apply sparingly twice daily to the rash on the legs 07/19/13   Estill Dooms, MD   BP 192/74  Pulse 122  Temp(Src) 101.6 F (38.7 C) (Rectal)  Resp 26  SpO2 94% Physical Exam  Nursing note and vitals reviewed. Constitutional: She is oriented to person, place, and time. She appears well-developed and well-nourished. No distress.  HENT:  Head: Normocephalic and atraumatic.  Eyes: EOM are normal.  Neck: Normal range of motion.   Cardiovascular: Intact distal pulses.   Tachycardia. Irregularly irregular  Pulmonary/Chest: Effort normal and breath sounds normal.  Abdominal: Soft. She exhibits no distension. There is no tenderness.  Musculoskeletal: Normal range of motion.  Neurological: She is alert and oriented to person, place, and time.  Skin: Skin is warm and dry.  Psychiatric: She has a normal mood and affect. Judgment normal.    ED Course  Procedures (including critical  care time) Labs Review Labs Reviewed  CBC WITH DIFFERENTIAL - Abnormal; Notable for the following:    WBC 22.1 (*)    Neutrophils Relative % 91 (*)    Neutro Abs 20.1 (*)    Lymphocytes Relative 2 (*)    Lymphs Abs 0.4 (*)    Monocytes Absolute 1.5 (*)    All other components within normal limits  COMPREHENSIVE METABOLIC PANEL - Abnormal; Notable for the following:    Sodium 136 (*)    Glucose, Bld 203 (*)    GFR calc non Af Amer 78 (*)    Anion gap 16 (*)    All other components within normal limits  URINALYSIS, ROUTINE W REFLEX MICROSCOPIC - Abnormal; Notable for the following:    APPearance CLOUDY (*)    Hgb urine dipstick TRACE (*)    Ketones, ur >80 (*)    Protein, ur 100 (*)    Nitrite POSITIVE (*)    Leukocytes, UA SMALL (*)    All other components within normal limits  URINE MICROSCOPIC-ADD ON - Abnormal; Notable for the following:    Bacteria, UA MANY (*)    All other components within normal limits  URINE CULTURE  CULTURE, BLOOD (ROUTINE X 2)  CULTURE, BLOOD (ROUTINE X 2)  TROPONIN I  I-STAT CG4 LACTIC ACID, ED    Imaging Review Dg Chest Port 1 View  (if Code Sepsis Called)  11/12/2013   CLINICAL DATA:  One day history of chest pain.  Cough  EXAM: PORTABLE CHEST - 1 VIEW  COMPARISON:  February 27, 2013  FINDINGS: There is focal airspace consolidation in the periphery of the left upper lobe. There is underlying emphysema. Elsewhere the lungs appear clear. Heart is upper normal in size with pulmonary vascularity  within normal limits. No adenopathy. There is arthropathy in the thoracic spine in both shoulders.  IMPRESSION: Left upper lobe airspace consolidation. This opacity appears somewhat nodular. While it most likely represents pneumonia, followup images to clearing advised to exclude the possibility of underlying neoplasm in this area.   Electronically Signed   By: Lowella Grip M.D.   On: 11/12/2013 09:03  I personally reviewed the imaging tests through PACS system I reviewed available ER/hospitalization records through the EMR    EKG Interpretation   Date/Time:  Saturday November 12 2013 08:27:55 EDT Ventricular Rate:  137 PR Interval:    QRS Duration: 94 QT Interval:  289 QTC Calculation: 436 R Axis:   -165 Text Interpretation:  Atrial fibrillation with rapid V-rate Probable  lateral infarct, age indeterminate atrial fibrillation is new since prior  ecg nonspecific lateral T wave changes as compared to prior Confirmed by  Zaylee Cornia  MD, Lennette Bihari ([id number]) on 11/12/2013 9:01:37 AM      ECG interpretation   Date: 11/12/2013  Rate: 117  Rhythm: normal sinus rhythm  QRS Axis: normal  Intervals: normal  ST/T Wave abnormalities: nonspecific lateral T wave changes  Conduction Disutrbances: none  Narrative Interpretation:   Old EKG Reviewed: regular rhythm now. afib from earlier resolved.      MDM   Final diagnoses:  HCAP (healthcare-associated pneumonia)  Fever, unspecified fever cause  Hypoxia  Urinary tract infection without hematuria, site unspecified    101.6. Atrial fibrillation with rapid ventricular response arrival. Mild hypoxia. So is initiated. Started on her buttocks. Labs, x-ray pending. This may represent pneumonia given pleuritic pain.. We will reevaluat atrial fibrillation with rapid ventricular response after fluid boluses.   10:09 AM Heart rhythm  is now sinus rhythm. Initially there were some P waves present on her original ECG. Admit to the hospital for urinary  tract infection and healthcare associated pneumonia. Patient is a DO NOT RESUSCITATE   Hoy Morn, MD 11/12/13 1057

## 2013-11-12 NOTE — ED Notes (Signed)
[phone] (son's cell number)   [phone] (daughter-in-law's cell number)

## 2013-11-12 NOTE — ED Notes (Signed)
Pt brought in by [facility] from [facility], pt c/o chest pain and increased urinary frequency starting last night. Pt c/o left sided chest pain with deep breaths and nonproductive cough. Per EMS pt was 89% sat on room air, EMS placed nasal cannula 2L, now at 94 %.

## 2013-11-12 NOTE — ED Notes (Signed)
Pt c/o of left sided chest pain with deep breaths since last night. Pt c/o urinary frequency, denies any other urinary symptoms.

## 2013-11-12 NOTE — H&P (Signed)
[facility] History and Physical  [name] TZG:[id number] DOB: 01-26-1918 DOA: 11/12/2013  Referring physician:  Marylene Buerger PCP:  Estill Dooms, MD   Chief Complaint:  Chest pain, urinary frequency  HPI:  The patient is a 80 y.o. year-old female with history of COPD with exertional hypoxia at baseline to the mid-80s on RA, not on oxygen because she rarely exerts herself and followed by Dr. Gwenette Greet, DM, CAD, GERD with hx of bleeding ulcer, arthritis, hearing impairment who presents with cough, SOB, and urinary frequency.  She lives at [place] and gets around with electric scooter or rolling walker.  The patient was last at their baseline health a few days ago.  She states she developed cough productive of clear phlegm and some increased SOB, particuarly with exertion about two days ago.  She became slightly more fatigued but denied fevers, chills, sore throat, sinus congestion.  Last night, she did not have an appetite for dinner and went to bed early.  She woke up in the middle of the night with pleuritic 10/10 left sided chest pain that did not get better.  She also had to void about every hour overnight.  This morning, she called the RN to be evaluated and she was trasnported to the ER.    In the ER, T 101.17F, HR 130s sinus tach, RR 29, BP 192/74 and 88% on RA at rest.  WBC 22.1, lactic acid wnl.  CXR notable for LUL PNA and UA positive for UTI.  ECG without ST segment changes and troponin neg.  She was started on vancomycin and zosyn for HCAP and is being admitted for sepsis due to pneumonia.  HR trended down with IVF.    Review of Systems:  General:  + fevers in ER, but none noticed before.  Denies chills, weight loss or gain HEENT:  Denies changes to hearing and vision, rhinorrhea, sinus congestion, sore throat CV:  Denies palpitations, lower extremity edema.  PULM:  + SOB, wheezing, cough.   GI:  Denies nausea, vomiting, constipation.  Softer stools last 4 days.    GU:  + dysuria, frequency, urgency ENDO:  Denies polyuria, polydipsia.   HEME:  Denies hematemesis, blood in stools, melena, abnormal bruising or bleeding.  LYMPH:  Denies lymphadenopathy.   MSK:  Baseline arthralgias, myalgias.   DERM:  Denies skin rash or ulcer.   NEURO:  Denies focal numbness, weakness, slurred speech, confusion, facial droop.  PSYCH:  Denies anxiety and depression.    Past Medical History  Diagnosis Date  . Type II or unspecified type diabetes mellitus without mention of complication, not stated as uncontrolled   . Hypertension   . Arthritis   . Ulcer   . COPD (chronic obstructive pulmonary disease)   . Regional enteritis of unspecified site   . Other malaise and fatigue   . Anemia, unspecified   . Other and unspecified hyperlipidemia   . Hypoxemia   . Pneumonia, organism unspecified   . Mucopurulent chronic bronchitis   . Palpitations   . Nontoxic uninodular goiter   . Abnormality of gait   . Fall from other slipping, tripping, or stumbling   . Cholelithiases   . Closed fracture of unspecified part of ramus of mandible   . Disturbance of skin sensation   . Sciatica   . Tension headache   . Coronary atherosclerosis of unspecified type of vessel, native or graft   . Osteoarthrosis, unspecified whether generalized or localized, unspecified site   .  Insomnia, unspecified   . Edema   . Tension headache   . Pain in joint, lower leg 2010    right knee  . Unspecified venous (peripheral) insufficiency   . Esophageal reflux   . Rosacea   . Cataract 1990    Dr. Katy Fitch  . Pain in joint, shoulder region 2013    left  . Gastric ulcer with hemorrhage 2008  . Diabetes mellitus type 2 in nonobese    Past Surgical History  Procedure Laterality Date  . Total knee arthroplasty Bilateral 1993, 1998    knees  . Other surgical history  2008    Ulcer surgery   . Small intestine surgery    . Eye surgery Bilateral 1990    Dr. Katy Fitch  . Gastrojejunostomy  05/2004     secondary to ulcer  . Joint replacement Bilateral [phone]    total knee replacement   Social History:  reports that she quit smoking about 40 years ago. Her smoking use included Cigarettes. She has a 3 pack-year smoking history. She has never used smokeless tobacco. She reports that she does not drink alcohol or use illicit drugs. Pt lives at [facility] in the independent living section since 1994.  Power scooter and rolling walker  Allergies  Allergen Reactions  . Adhesive [Tape]     unknown  . Aspirin     unknown  . Codeine     unknown  . Enalapril Cough  . Hydrocodone     unknown  . Pantoprazole Sodium     unknown    Family History  Problem Relation Age of Onset  . Heart disease Father   . Colon cancer Sister   . Diabetes Brother   . Obesity Brother   . Mental retardation Daughter   . Obesity Daughter      Prior to Admission medications   Medication Sig Start Date End Date Taking? Authorizing Provider  acetaminophen (TYLENOL) 325 MG tablet Take 650 mg by mouth every 6 (six) hours as needed for moderate pain (pain).   Yes Historical Provider, MD  amLODipine (NORVASC) 5 MG tablet Take 10 mg by mouth daily.    Yes Historical Provider, MD  calcium carbonate (TUMS EX) 750 MG chewable tablet Chew 2 tablets by mouth 3 (three) times daily as needed for heartburn (after meals if needed for heartburn).    Yes Historical Provider, MD  cetirizine (ZYRTEC) 10 MG tablet Take 10 mg by mouth daily.   Yes Historical Provider, MD  cloNIDine (CATAPRES) 0.1 MG tablet Take 1 tablet for blood pressure greater than 160/100. May repeat in 1 hour if still greater than 160/100. 08/18/13  Yes Estill Dooms, MD  diclofenac sodium (VOLTAREN) 1 % GEL Apply 1 g topically 4 (four) times daily as needed (knees).    Yes Historical Provider, MD  doxazosin (CARDURA) 8 MG tablet Take 8 mg by mouth daily. To control BP.   Yes Historical Provider, MD  doxepin (SINEQUAN) 10 MG capsule Take 10 mg by  mouth at bedtime as needed (for rest).    Yes Historical Provider, MD  furosemide (LASIX) 40 MG tablet Take 40 mg by mouth daily. Take one daily 08/22/13  Yes Historical Provider, MD  glipiZIDE (GLUCOTROL) 10 MG tablet Take 10 mg by mouth 2 (two) times daily.   Yes Historical Provider, MD  glucose blood test strip 1 each by Other route daily. Use once daily to test blood sugar.   Yes Historical Provider, MD  losartan (COZAAR) 100 MG tablet Take 100 mg by mouth daily. Take one tablet daily for blood pressure   Yes Historical Provider, MD  metFORMIN (GLUCOPHAGE) 500 MG tablet Take 1,000 mg by mouth 2 (two) times daily with a meal.   Yes Historical Provider, MD  metoprolol (LOPRESSOR) 50 MG tablet Take 50 mg by mouth 2 (two) times daily.   Yes Historical Provider, MD  Multiple Vitamin (MULTIVITAMIN) capsule Take 1 capsule by mouth daily.   Yes Historical Provider, MD  omeprazole (PRILOSEC) 40 MG capsule Take 40 mg by mouth daily.   Yes Historical Provider, MD  pravastatin (PRAVACHOL) 20 MG tablet Take 20 mg by mouth daily.   Yes Historical Provider, MD  Simethicone (GAS RELIEF 80 PO) Take 1 tablet by mouth daily.    Yes Historical Provider, MD  simvastatin (ZOCOR) 20 MG tablet Take 20 mg by mouth daily.   Yes Historical Provider, MD   Physical Exam: Filed Vitals:   11/12/13 1045 11/12/13 1100 11/12/13 1115 11/12/13 1130  BP: 145/66 155/71 159/69 180/71  Pulse: 115 117 115 127  Temp:      TempSrc:      Resp: 20 22 30 30   Weight:      SpO2: 97% 95% 96% 94%     General:  WF, mild to moderate respiratory distress with SCM retractions, audible wheeze with forced expiratory phase, tachypnea  Eyes:  PERRL, anicteric, non-injected.  ENT:  Nares clear.  OP clear, tachy membranes and possible thrush on soft palate  Neck:  Supple without TM or JVD.    Lymph:  No cervical, supraclavicular, or submandibular LAD.  Cardiovascular:  Tachycardic, RR, normal S1, S2, without m/r/g.  2+ pulses, warm  extremities  Respiratory:  Course rales anterior left chest and axillary space, diminished throughout with high pitched full expiratory wheeze, no rhonchi  Abdomen:  NABS.  Soft, ND/NT.    Skin:  No rashes or focal lesions.  Musculoskeletal:  Normal bulk and tone.  No LE edema.  Psychiatric:  A & O x 4.  Appropriate affect.  Neurologic:  CN 3-12 intact.  4/5 strength.  Sensation intact.  Labs on Admission:  Basic Metabolic Panel:  Recent Labs Lab 11/12/13 0840  NA 136*  K 3.9  CL 96  CO2 24  GLUCOSE 203*  BUN 14  CREATININE 0.55  CALCIUM 8.9   Liver Function Tests:  Recent Labs Lab 11/12/13 0840  AST 15  ALT 11  ALKPHOS 85  BILITOT 0.5  PROT 6.8  ALBUMIN 3.5   No results found for this basename: LIPASE, AMYLASE,  in the last 168 hours No results found for this basename: AMMONIA,  in the last 168 hours CBC:  Recent Labs Lab 11/12/13 0840  WBC 22.1*  NEUTROABS 20.1*  HGB 12.1  HCT 37.0  MCV 95.4  PLT 179   Cardiac Enzymes:  Recent Labs Lab 11/12/13 0840  TROPONINI <0.30    BNP (last 3 results) No results found for this basename: PROBNP,  in the last 8760 hours CBG: No results found for this basename: GLUCAP,  in the last 168 hours  Radiological Exams on Admission: Dg Chest Port 1 View  (if Code Sepsis Called)  11/12/2013   CLINICAL DATA:  One day history of chest pain.  Cough  EXAM: PORTABLE CHEST - 1 VIEW  COMPARISON:  February 27, 2013  FINDINGS: There is focal airspace consolidation in the periphery of the left upper lobe. There is underlying emphysema. Elsewhere the  lungs appear clear. Heart is upper normal in size with pulmonary vascularity within normal limits. No adenopathy. There is arthropathy in the thoracic spine in both shoulders.  IMPRESSION: Left upper lobe airspace consolidation. This opacity appears somewhat nodular. While it most likely represents pneumonia, followup images to clearing advised to exclude the possibility of  underlying neoplasm in this area.   Electronically Signed   By: Lowella Grip M.D.   On: 11/12/2013 09:03    EKG: Independently reviewed. Sinus tachycardia,   Assessment/Plan Active Problems:   COPD (chronic obstructive pulmonary disease)   Diabetes mellitus   Hypertension   HCAP (healthcare-associated pneumonia)   Sepsis   Acute respiratory failure with hypoxia   COPD with acute exacerbation  ---  Sepsis (fever, WBC, tachycardia, tachypnea) due to HCAP and UTI.   -  Blood cultures pending -  F/u urine culture -  Legionella, S. pneumo urine ag  -  Sputum culture if able -  Resp viral panel -  Vancomycin and zosyn  -  mucolytics  Acute hypoxic resp failure secondary to HCAP and acute COPD exacerbation -  Start solumedrol -  Duonebs q4h -  tx for pneumonia as above -  Flutter valve -  Wean oxygen as tolerated -  May need bipap prn.   -  DNR/DNI  Pleuritic chest pain likely related to pneumonia -  Telemetry -  Cycle troponins  Sinus tachycardia likely secondary to sepsis -  Hold lasix -  Judicious use of IVF -  Minimize use of medications which can cause tachycardia  HTN/HLD, blood pressure elevated, but concerned she may develop hypotension -  Continue home BP medications except change norvasc to 5mg  daily -  Continue statin   DM, mild hyperglycemia -  Hold oral meds -  SSI   Leukocytosis due to multiple infections -  Trend WBC  Hyponatremia, mild and asymptomatic and may be secondary to SIADH from pneumonia -  Trend sodium -  No fluid restrictions at this time  GERD w/ hx of bleeding ulcers -  Continue omeprazole   Leukocytosis -  Due to HCAP and UTI -  Repeat in AM  Diet:  Diabetic  Access:  PIV IVF:  off Proph:  lovenox  Code Status: DNR/DNI but okay with trial of bipap  Family Communication: patient, her son and daughter-in-law Disposition Plan: Admit to stepdown  Time spent: 60 min Janece Canterbury [facility] Pager  [phone]  If 7PM-7AM, please contact night-coverage [email or web address] Password [email or web address] 11/12/2013, 11:51 AM

## 2013-11-12 NOTE — Progress Notes (Addendum)
ANTIBIOTIC CONSULT NOTE - INITIAL  Pharmacy Consult for Vancomycin Indication: rule out sepsis  Allergies  Allergen Reactions  . Adhesive [Tape]     unknown  . Aspirin     unknown  . Codeine     unknown  . Enalapril Cough  . Hydrocodone     unknown  . Pantoprazole Sodium     unknown    Patient Measurements: Weight: 178 lb (80.74 kg) (from 11/01/13)  Vital Signs: Temp: 101.Carla F (38.7 C) (10/10 0826) Temp Source: Rectal (10/10 0826) BP: 192/74 mmHg (10/10 0826) Pulse Rate: 122 (10/10 0834) Intake/Output from previous day:   Intake/Output from this shift:    Labs:  Recent Labs  11/12/13 0840  WBC 22.1*  HGB 12.1  PLT 179  CREATININE 0.55   The CrCl is unknown because both a height and weight (above a minimum accepted value) are required for this calculation. No results found for this basename: VANCOTROUGH, VANCOPEAK, VANCORANDOM, GENTTROUGH, GENTPEAK, GENTRANDOM, TOBRATROUGH, TOBRAPEAK, TOBRARND, AMIKACINPEAK, AMIKACINTROU, AMIKACIN,  in the last 72 hours   Microbiology: No results found for this or any previous visit (from the past 720 hour(s)).  Medical History: Past Medical History  Diagnosis Date  . Type II or unspecified type diabetes mellitus without mention of complication, not stated as uncontrolled   . Hypertension   . Arthritis   . Ulcer   . COPD (chronic obstructive pulmonary disease)   . Regional enteritis of unspecified site   . Other malaise and fatigue   . Anemia, unspecified   . Other and unspecified hyperlipidemia   . Hypoxemia   . Pneumonia, organism unspecified   . Mucopurulent chronic bronchitis   . Palpitations   . Nontoxic uninodular goiter   . Abnormality of gait   . Fall from other slipping, tripping, or stumbling   . Cholelithiases   . Closed fracture of unspecified part of ramus of mandible   . Disturbance of skin sensation   . Sciatica   . Tension headache   . Coronary atherosclerosis of unspecified type of vessel,  native or graft   . Osteoarthrosis, unspecified whether generalized or localized, unspecified site   . Insomnia, unspecified   . Edema   . Tension headache   . Pain in joint, lower leg 2010    right knee  . Unspecified venous (peripheral) insufficiency   . Esophageal reflux   . Rosacea   . Cataract 1990    Dr. Katy Fitch  . Pain in joint, shoulder region 2013    left  . Gastric ulcer with hemorrhage 2008    Assessment: [name] admitted on 10/10 with c/o chest pain, cough, and urinary frequency.  First dose of Zosyn given in ED.  Pharmacy consulted to dose vancomycin and Zosyn for suspected sepsis, HCAP.  10/10 >> Zosyn >> 10/10 >> Vanc >>    Tmax: 101.Carla WBCs: 12.1 Renal: SCr 0.55, CrCl ~ 44 ml/min (SCr rounded to 1 for age) Lactic acid: 1.49   Goal of Therapy:  Vancomycin trough level 15-20 mcg/ml Appropriate abx dosing, eradication of infection.   Plan:  Zosyn 3.375g IV Q8H infused over 4hrs.   Vancomycin 1250 mg IV once, then 750 mg IV qh.  Measure Vanc trough at steady state.  Follow up renal fxn and culture results.   Gretta Arab PharmD, BCPS Pager [phone] 11/12/2013 8:50 AM

## 2013-11-12 NOTE — ED Notes (Signed)
Bed: YD74 Expected date: 11/12/13 Expected time: 8:17 AM Means of arrival: Ambulance Comments: Tachy, fever ? UTI

## 2013-11-13 LAB — CBC
HEMATOCRIT: 31.4 % — AB (ref 36.0–46.0)
HEMOGLOBIN: 10.6 g/dL — AB (ref 12.0–15.0)
MCH: 31.5 pg (ref 26.0–34.0)
MCHC: 33.8 g/dL (ref 30.0–36.0)
MCV: 93.2 fL (ref 78.0–100.0)
Platelets: 168 10*3/uL (ref 150–400)
RBC: 3.37 MIL/uL — ABNORMAL LOW (ref 3.87–5.11)
RDW: 14 % (ref 11.5–15.5)
WBC: 19 10*3/uL — ABNORMAL HIGH (ref 4.0–10.5)

## 2013-11-13 LAB — LEGIONELLA ANTIGEN, URINE

## 2013-11-13 LAB — BASIC METABOLIC PANEL
ANION GAP: 11 (ref 5–15)
BUN: 11 mg/dL (ref 6–23)
CHLORIDE: 100 meq/L (ref 96–112)
CO2: 24 meq/L (ref 19–32)
Calcium: 8 mg/dL — ABNORMAL LOW (ref 8.4–10.5)
Creatinine, Ser: 0.56 mg/dL (ref 0.50–1.10)
GFR calc Af Amer: 90 mL/min — ABNORMAL LOW (ref >90)
GFR calc non Af Amer: 77 mL/min — ABNORMAL LOW (ref >90)
Glucose, Bld: 290 mg/dL — ABNORMAL HIGH (ref 70–99)
Potassium: 3.8 mEq/L (ref 3.7–5.3)
Sodium: 135 mEq/L — ABNORMAL LOW (ref 137–147)

## 2013-11-13 LAB — EXPECTORATED SPUTUM ASSESSMENT W GRAM STAIN, RFLX TO RESP C

## 2013-11-13 LAB — TROPONIN I: Troponin I: <0.3 ng/mL (ref <0.30)

## 2013-11-13 LAB — GLUCOSE, CAPILLARY
GLUCOSE-CAPILLARY: 244 mg/dL — AB (ref 70–99)
GLUCOSE-CAPILLARY: 244 mg/dL — AB (ref 70–99)
Glucose-Capillary: 265 mg/dL — ABNORMAL HIGH (ref 70–99)
Glucose-Capillary: 267 mg/dL — ABNORMAL HIGH (ref 70–99)
Glucose-Capillary: 370 mg/dL — ABNORMAL HIGH (ref 70–99)
Glucose-Capillary: 224 mg/dL — ABNORMAL HIGH (ref 70–99)

## 2013-11-13 LAB — EXPECTORATED SPUTUM ASSESSMENT W REFEX TO RESP CULTURE

## 2013-11-13 MED ORDER — OMEPRAZOLE 20 MG PO CPDR
40.0000 mg | DELAYED_RELEASE_CAPSULE | Freq: Every day | ORAL | Status: DC
  Administered 2013-11-13 – 2013-11-16 (×4): 40 mg via ORAL
  Filled 2013-11-13 (×6): qty 2

## 2013-11-13 MED ORDER — FUROSEMIDE 40 MG PO TABS
40.0000 mg | ORAL_TABLET | Freq: Every day | ORAL | Status: DC
  Administered 2013-11-13 – 2013-11-17 (×5): 40 mg via ORAL
  Filled 2013-11-13 (×5): qty 1

## 2013-11-13 MED ORDER — INSULIN ASPART 100 UNIT/ML ~~LOC~~ SOLN
3.0000 [IU] | Freq: Three times a day (TID) | SUBCUTANEOUS | Status: DC
  Administered 2013-11-13 (×3): 3 [IU] via SUBCUTANEOUS

## 2013-11-13 MED ORDER — INSULIN GLARGINE 100 UNIT/ML ~~LOC~~ SOLN
10.0000 [IU] | Freq: Every day | SUBCUTANEOUS | Status: DC
  Filled 2013-11-13: qty 0.1

## 2013-11-13 MED ORDER — INSULIN GLARGINE 100 UNIT/ML ~~LOC~~ SOLN
5.0000 [IU] | Freq: Every day | SUBCUTANEOUS | Status: DC
Start: 2013-11-13 — End: 2013-11-13
  Administered 2013-11-13: 5 [IU] via SUBCUTANEOUS
  Filled 2013-11-13: qty 0.05

## 2013-11-13 MED ORDER — INSULIN ASPART 100 UNIT/ML ~~LOC~~ SOLN
0.0000 [IU] | Freq: Three times a day (TID) | SUBCUTANEOUS | Status: DC
  Administered 2013-11-13: 7 [IU] via SUBCUTANEOUS
  Administered 2013-11-14: 20 [IU] via SUBCUTANEOUS
  Administered 2013-11-14: 7 [IU] via SUBCUTANEOUS
  Administered 2013-11-14: 11 [IU] via SUBCUTANEOUS
  Administered 2013-11-15: 4 [IU] via SUBCUTANEOUS
  Administered 2013-11-15: 15 [IU] via SUBCUTANEOUS
  Administered 2013-11-15: 7 [IU] via SUBCUTANEOUS
  Administered 2013-11-16: 3 [IU] via SUBCUTANEOUS
  Administered 2013-11-16: 4 [IU] via SUBCUTANEOUS
  Administered 2013-11-16 – 2013-11-17 (×2): 3 [IU] via SUBCUTANEOUS

## 2013-11-13 MED ORDER — WHITE PETROLATUM GEL
Status: DC | PRN
  Filled 2013-11-13: qty 5

## 2013-11-13 MED ORDER — OMEPRAZOLE 20 MG PO CPDR
20.0000 mg | DELAYED_RELEASE_CAPSULE | Freq: Every day | ORAL | Status: DC
  Filled 2013-11-13: qty 1

## 2013-11-13 NOTE — Progress Notes (Signed)
Notified Dr. Sheran Fava of patients positive blood culture result.

## 2013-11-13 NOTE — Progress Notes (Signed)
[facility] PROGRESS NOTE  [name] EUM:[id number] DOB: 05-Sep-1917 DOA: 11/12/2013 PCP: Estill Dooms, MD  Brief Summary  The patient is a 80 y.o. year-old female with history of COPD with exertional hypoxia at baseline to the mid-80s on RA, not on oxygen because she rarely exerts herself and followed by Dr. Gwenette Greet, DM, CAD, GERD with hx of bleeding ulcer, arthritis, hearing impairment who presents with cough, SOB, and urinary frequency. She lives at [place] and gets around with electric scooter or rolling walker. She presented with cough productive of clear phlegm, SOB, pleuritic 10/10 left sided chest pain, and urinary urgency/frequency. In the ER, she was febrile to 101.27F, HR 130s sinus tach, and was found to have LUL PNA and UTI.  She was started on vanc and zosyn and admitted to stepdown.    10/10 Respiratory distress upon admission to stepdown requiring bipap.  Also had episode of nonresponsiveness that self-resolved  Assessment/Plan  Sepsis (fever, WBC, tachycardia, tachypnea) due to HCAP and UTI.  - Blood cultures pending  - Urine culture pending - Legionella, S. pneumo urine ag pending - Sputum culture obtained this AM - Resp viral panel pending - Continue Vancomycin and zosyn day 2 - mucolytics   Acute hypoxic resp failure secondary to HCAP and acute COPD exacerbation  - Continue solumedrol  - Duonebs q4h  - tx for pneumonia as above  - Flutter valve  - Wean oxygen as tolerated  - continue stepdown today - Continue bipap prn respiratory distress - DNR/DNI   Pleuritic chest pain likely related to pneumonia  - Telemetry:  MAT, sinus tach, now NSR with occasional PVC and NSVT - troponins neg  Sinus tachycardia likely secondary to sepsis  - resume lasix  - d/c IVF  - Minimize use of medications which can cause tachycardia   HTN/HLD, blood pressure trending down to normal - Continue norvasc to 5mg  daily  -  Continue bisoprolol - Continue  statin   DM, hyperglycemia likely due to steroids - Hold oral meds  - SSI  - add meal-time coverage 3 units and lantus 5 units  Leukocytosis due to multiple infections and steroids, trending down - Trend WBC   Normocytic anemia, likely secondary to marrow suppressions from acute illness or side effect of zosyn -  Trend hgb  Hyponatremia, mild and asymptomatic and may be secondary to SIADH from pneumonia  - Trend sodium  - No fluid restrictions at this time   GERD w/ hx of bleeding ulcers, stable. Continue omeprazole   Diet: Diabetic  Access: PIV  IVF: off  Proph: lovenox   Code Status: DNR/DNI but okay with bipap  Family Communication: patient, her son and daughter-in-law  Disposition Plan: Admit to stepdown  Consultants:  none  Procedures:  CXR  Antibiotics:  vanc 10/10 >>  Zosyn 10/10 >>   HPI/Subjective:  Patient feeling much better and ready to order breakfast.  States her bladder is feeling okay (has foley) and thinks she may have a BM this AM.    Objective: Filed Vitals:   11/13/13 0300 11/13/13 0400 11/13/13 0500 11/13/13 0600  BP:  165/53 145/48 137/36  Pulse:  104 95 81  Temp:      TempSrc:      Resp:  15 24 22   Height:      Weight:      SpO2: 100% 98% 93% 92%    Intake/Output Summary (Last 24 hours) at 11/13/13 0744 Last data filed at 11/13/13  0600  Gross per 24 hour  Intake   1300 ml  Output    450 ml  Net    850 ml   Filed Weights   11/12/13 0834 11/12/13 1248  Weight: 80.74 kg (178 lb) 80 kg (176 lb 5.9 oz)    Exam:   General:  WF, mild respiratory distress with faint SCM retractions at rest  HEENT:  NCAT, MMM  Cardiovascular:  RRR, nl S1, S2 no mrg, 2+ pulses, warm extremities  Respiratory:   Rales anterior lateral left chest and anterior right chest, expiratory wheeze throughout, no rhonchi   Abdomen:   NABS, soft, NT/ND  MSK:   Normal tone and bulk, no LEE  Neuro:  Grossly intact  Data Reviewed: Basic Metabolic  Panel:  Recent Labs Lab 11/12/13 0840 11/13/13 0156  NA 136* 135*  K 3.9 3.8  CL 96 100  CO2 24 24  GLUCOSE 203* 290*  BUN 14 11  CREATININE 0.55 0.56  CALCIUM 8.9 8.0*   Liver Function Tests:  Recent Labs Lab 11/12/13 0840  AST 15  ALT 11  ALKPHOS 85  BILITOT 0.5  PROT 6.8  ALBUMIN 3.5   No results found for this basename: LIPASE, AMYLASE,  in the last 168 hours No results found for this basename: AMMONIA,  in the last 168 hours CBC:  Recent Labs Lab 11/12/13 0840 11/13/13 0156  WBC 22.1* 19.0*  NEUTROABS 20.1*  --   HGB 12.1 10.6*  HCT 37.0 31.4*  MCV 95.4 93.2  PLT 179 168   Cardiac Enzymes:  Recent Labs Lab 11/12/13 0840 11/12/13 1345 11/12/13 1930 11/13/13 0156  TROPONINI <0.30 <0.30 <0.30 <0.30   BNP (last 3 results) No results found for this basename: PROBNP,  in the last 8760 hours CBG:  Recent Labs Lab 11/12/13 1347 11/12/13 1628  GLUCAP 172* 180*    Recent Results (from the past 240 hour(s))  MRSA PCR SCREENING     Status: None   Collection Time    11/12/13  1:09 PM      Result Value Ref Range Status   MRSA by PCR NEGATIVE  NEGATIVE Final   Comment:            The GeneXpert MRSA Assay (FDA     approved for NASAL specimens     only), is one component of a     comprehensive MRSA colonization     surveillance program. It is not     intended to diagnose MRSA     infection nor to guide or     monitor treatment for     MRSA infections.     Studies: Dg Chest Port 1 View  (if Code Sepsis Called)  11/12/2013   CLINICAL DATA:  One day history of chest pain.  Cough  EXAM: PORTABLE CHEST - 1 VIEW  COMPARISON:  February 27, 2013  FINDINGS: There is focal airspace consolidation in the periphery of the left upper lobe. There is underlying emphysema. Elsewhere the lungs appear clear. Heart is upper normal in size with pulmonary vascularity within normal limits. No adenopathy. There is arthropathy in the thoracic spine in both shoulders.   IMPRESSION: Left upper lobe airspace consolidation. This opacity appears somewhat nodular. While it most likely represents pneumonia, followup images to clearing advised to exclude the possibility of underlying neoplasm in this area.   Electronically Signed   By: Lowella Grip M.D.   On: 11/12/2013 09:03    Scheduled Meds: . amLODipine  5 mg Oral Daily  . bisoprolol  10 mg Oral Daily  . doxazosin  8 mg Oral Daily  . enoxaparin (LOVENOX) injection  40 mg Subcutaneous Q24H  . insulin aspart  0-5 Units Subcutaneous QHS  . insulin aspart  0-9 Units Subcutaneous TID WC  . ipratropium  0.5 mg Nebulization Q4H  . levalbuterol  1.25 mg Nebulization Q4H  . loratadine  10 mg Oral Daily  . losartan  100 mg Oral Daily  . methylPREDNISolone (SOLU-MEDROL) injection  80 mg Intravenous Q12H  . multivitamin with minerals  1 tablet Oral Daily  . omeprazole  40 mg Oral Daily  . piperacillin-tazobactam (ZOSYN)  IV  3.375 g Intravenous Q8H  . simvastatin  20 mg Oral q1800  . vancomycin  750 mg Intravenous Q12H   Continuous Infusions: . sodium chloride 75 mL/hr at 11/13/13 0400    Active Problems:   COPD (chronic obstructive pulmonary disease)   Diabetes mellitus   Hypertension   HCAP (healthcare-associated pneumonia)   Sepsis   Acute respiratory failure with hypoxia   COPD with acute exacerbation    Time spent: 30 min    Kattaleya Alia, [facility] Pager [phone]. If 7PM-7AM, please contact night-coverage at [email or web address], password [facility] 11/13/2013, 7:44 AM  LOS: 1 day

## 2013-11-14 ENCOUNTER — Inpatient Hospital Stay (HOSPITAL_COMMUNITY): Payer: Medicare Other

## 2013-11-14 LAB — RESPIRATORY VIRUS PANEL
ADENOVIRUS: NOT DETECTED
INFLUENZA A H3: NOT DETECTED
Influenza A H1: NOT DETECTED
Influenza A: NOT DETECTED
Influenza B: NOT DETECTED
Metapneumovirus: NOT DETECTED
Parainfluenza 1: NOT DETECTED
Parainfluenza 2: NOT DETECTED
Parainfluenza 3: NOT DETECTED
RESPIRATORY SYNCYTIAL VIRUS A: NOT DETECTED
RESPIRATORY SYNCYTIAL VIRUS B: NOT DETECTED
RHINOVIRUS: NOT DETECTED

## 2013-11-14 LAB — GLUCOSE, CAPILLARY
Glucose-Capillary: 270 mg/dL — ABNORMAL HIGH (ref 70–99)
Glucose-Capillary: 387 mg/dL — ABNORMAL HIGH (ref 70–99)
Glucose-Capillary: 229 mg/dL — ABNORMAL HIGH (ref 70–99)
Glucose-Capillary: 116 mg/dL — ABNORMAL HIGH (ref 70–99)

## 2013-11-14 LAB — BASIC METABOLIC PANEL
Anion gap: 11 (ref 5–15)
BUN: 17 mg/dL (ref 6–23)
CO2: 25 mEq/L (ref 19–32)
Calcium: 8.7 mg/dL (ref 8.4–10.5)
Chloride: 97 mEq/L (ref 96–112)
Creatinine, Ser: 0.69 mg/dL (ref 0.50–1.10)
GFR calc non Af Amer: 72 mL/min — ABNORMAL LOW (ref >90)
GFR, EST AFRICAN AMERICAN: 84 mL/min — AB (ref >90)
Glucose, Bld: 282 mg/dL — ABNORMAL HIGH (ref 70–99)
POTASSIUM: 3.9 meq/L (ref 3.7–5.3)
Sodium: 133 mEq/L — ABNORMAL LOW (ref 137–147)

## 2013-11-14 LAB — CBC
HCT: 32.4 % — ABNORMAL LOW (ref 36.0–46.0)
Hemoglobin: 10.9 g/dL — ABNORMAL LOW (ref 12.0–15.0)
MCH: 31.2 pg (ref 26.0–34.0)
MCHC: 33.6 g/dL (ref 30.0–36.0)
MCV: 92.8 fL (ref 78.0–100.0)
PLATELETS: 185 10*3/uL (ref 150–400)
RBC: 3.49 MIL/uL — AB (ref 3.87–5.11)
RDW: 14 % (ref 11.5–15.5)
WBC: 17.8 10*3/uL — AB (ref 4.0–10.5)

## 2013-11-14 LAB — VANCOMYCIN, TROUGH: Vancomycin Tr: 11.8 ug/mL (ref 10.0–20.0)

## 2013-11-14 MED ORDER — CHLORHEXIDINE GLUCONATE 0.12 % MT SOLN
15.0000 mL | Freq: Two times a day (BID) | OROMUCOSAL | Status: DC
  Administered 2013-11-15 – 2013-11-17 (×5): 15 mL via OROMUCOSAL
  Filled 2013-11-14 (×8): qty 15

## 2013-11-14 MED ORDER — AMLODIPINE BESYLATE 10 MG PO TABS
10.0000 mg | ORAL_TABLET | Freq: Every day | ORAL | Status: DC
  Administered 2013-11-14 – 2013-11-17 (×4): 10 mg via ORAL
  Filled 2013-11-14 (×4): qty 1

## 2013-11-14 MED ORDER — MAGIC MOUTHWASH W/LIDOCAINE
5.0000 mL | Freq: Four times a day (QID) | ORAL | Status: DC
  Administered 2013-11-14 – 2013-11-17 (×11): 5 mL via ORAL
  Filled 2013-11-14 (×16): qty 5

## 2013-11-14 MED ORDER — VANCOMYCIN HCL IN DEXTROSE 1-5 GM/200ML-% IV SOLN
1000.0000 mg | Freq: Two times a day (BID) | INTRAVENOUS | Status: DC
  Administered 2013-11-14 – 2013-11-15 (×3): 1000 mg via INTRAVENOUS
  Filled 2013-11-14 (×3): qty 200

## 2013-11-14 MED ORDER — INSULIN ASPART 100 UNIT/ML ~~LOC~~ SOLN
5.0000 [IU] | Freq: Three times a day (TID) | SUBCUTANEOUS | Status: DC
  Administered 2013-11-14 – 2013-11-17 (×11): 5 [IU] via SUBCUTANEOUS

## 2013-11-14 MED ORDER — INSULIN GLARGINE 100 UNIT/ML ~~LOC~~ SOLN
15.0000 [IU] | Freq: Every day | SUBCUTANEOUS | Status: DC
  Administered 2013-11-14 – 2013-11-17 (×4): 15 [IU] via SUBCUTANEOUS
  Filled 2013-11-14 (×4): qty 0.15

## 2013-11-14 NOTE — ED Notes (Signed)
Pt's chart accessed after director of [facility] called to inquire about time pt arrived to emergency department.

## 2013-11-14 NOTE — Progress Notes (Signed)
[facility] PROGRESS NOTE  [name] MVH:[id number] DOB: February 04, 1918 DOA: 11/12/2013 PCP: Estill Dooms, MD  Brief Summary  The patient is a 80 y.o. year-old female with history of COPD with exertional hypoxia at baseline to the mid-80s on RA, not on oxygen because she rarely exerts herself and followed by Dr. Gwenette Greet, DM, CAD, GERD with hx of bleeding ulcer, arthritis, hearing impairment who presents with cough, SOB, and urinary frequency. She lives at [place] and gets around with electric scooter or rolling walker. She presented with cough productive of clear phlegm, SOB, pleuritic 10/10 left sided chest pain, and urinary urgency/frequency. In the ER, she was febrile to 101.62F, HR 130s sinus tach, and was found to have LUL PNA and UTI.  She was started on vanc and zosyn and admitted to stepdown.    10/10 Respiratory distress upon admission to stepdown requiring bipap.  Also had episode of nonresponsiveness that self-resolved 10/11 Blood cultures positive for GPC in clusters AND GNR  Assessment/Plan  Sepsis and septicemia (fever, WBC, tachycardia, tachypnea) due to HCAP and UTI.  Temperature and WBC trending down.   - 1/2 Blood cultures growing GPC in clusters AND GNR - Urine culture:  E. Coli.  Sensitivities pending - Legionella, S. pneumo urine ag both negative - Sputum culture pending - Resp viral panel pending - Continue Vancomycin and zosyn day 3 - mucolytics   GPC in clusters and GNR  -  F/u repeat blood cultures -  F/u speciation  Acute hypoxic resp failure secondary to HCAP and acute COPD exacerbation, still wheezing - Repeat CXR  - Continue solumedrol  - Duonebs q4h  - tx for pneumonia as above  - Flutter valve  - Wean oxygen as tolerated   Pleuritic chest pain likely related to pneumonia, resolved - Telemetry:  MAT, sinus tach, now NSR with occasional PVC and NSVT - troponins neg  Sinus tachycardia likely secondary to sepsis,  resolved  HTN/HLD, blood pressure trending down to normal  -  Increase to norvasc 10 mg daily  -  Continue bisoprolol -  Continue statin   DM, hyperglycemia likely due to steroids, A1c 7.2 - Hold oral meds  - SSI  - increase lantus 15 units - add aspart 5 units with meals  Leukocytosis due to multiple infections and steroids, trending down - Trend WBC   Normocytic anemia, likely secondary to marrow suppressions from acute illness or side effect of zosyn -  Trend hgb  Hyponatremia, mild and asymptomatic and may be secondary to SIADH from pneumonia  - Trend sodium  - No fluid restrictions at this time   GERD w/ hx of bleeding ulcers, stable. Continue omeprazole   Diet: Diabetic  Access: PIV  IVF: off  Proph: lovenox   Code Status: DNR/DNI but okay with bipap  Family Communication: patient, her son and daughter-in-law  Disposition Plan:  Transfer to [facility]  Consultants:  none  Procedures:  CXR  Antibiotics:  vanc 10/10 >>  Zosyn 10/10 >>   HPI/Subjective:  States she is feeling well and wants to know when she can go home.    Objective: Filed Vitals:   11/13/13 2304 11/13/13 2346 11/14/13 0245 11/14/13 0419  BP: 165/62     Pulse: 82     Temp: 97.7 F (36.5 C)  97.7 F (36.5 C)   TempSrc: Oral  Oral   Resp: 29     Height:      Weight:   83.7 kg (184  lb 8.4 oz)   SpO2: 95% 96%  96%    Intake/Output Summary (Last 24 hours) at 11/14/13 0752 Last data filed at 11/14/13 0600  Gross per 24 hour  Intake    200 ml  Output   1575 ml  Net  -1375 ml   Filed Weights   11/12/13 0834 11/12/13 1248 11/14/13 0245  Weight: 80.74 kg (178 lb) 80 kg (176 lb 5.9 oz) 83.7 kg (184 lb 8.4 oz)    Exam:   General:  WF, mild respiratory distress with faint SCM retractions at rest  HEENT:  NCAT, MMM  Cardiovascular:  RRR, nl S1, S2 no mrg, 2+ pulses, warm extremities  Respiratory:   Rales anterior lateral left chest and anterior right chest, expiratory wheeze  throughout, no rhonchi   Abdomen:   NABS, soft, NT/ND  MSK:   Normal tone and bulk, no LEE  Neuro:  Grossly intact  Data Reviewed: Basic Metabolic Panel:  Recent Labs Lab 11/12/13 0840 11/13/13 0156 11/14/13 0555  NA 136* 135* 133*  K 3.9 3.8 3.9  CL 96 100 97  CO2 24 24 25   GLUCOSE 203* 290* 282*  BUN 14 11 17   CREATININE 0.55 0.56 0.69  CALCIUM 8.9 8.0* 8.7   Liver Function Tests:  Recent Labs Lab 11/12/13 0840  AST 15  ALT 11  ALKPHOS 85  BILITOT 0.5  PROT 6.8  ALBUMIN 3.5   No results found for this basename: LIPASE, AMYLASE,  in the last 168 hours No results found for this basename: AMMONIA,  in the last 168 hours CBC:  Recent Labs Lab 11/12/13 0840 11/13/13 0156 11/14/13 0555  WBC 22.1* 19.0* 17.8*  NEUTROABS 20.1*  --   --   HGB 12.1 10.6* 10.9*  HCT 37.0 31.4* 32.4*  MCV 95.4 93.2 92.8  PLT 179 168 185   Cardiac Enzymes:  Recent Labs Lab 11/12/13 0840 11/12/13 1345 11/12/13 1930 11/13/13 0156  TROPONINI <0.30 <0.30 <0.30 <0.30   BNP (last 3 results) No results found for this basename: PROBNP,  in the last 8760 hours CBG:  Recent Labs Lab 11/12/13 2205 11/13/13 0807 11/13/13 1218 11/13/13 1725 11/13/13 2146  GLUCAP 267* 244* 370* 224* 244*    Recent Results (from the past 240 hour(s))  CULTURE, BLOOD (ROUTINE X 2)     Status: None   Collection Time    11/12/13  8:40 AM      Result Value Ref Range Status   Specimen Description BLOOD RIGHT FOREARM   Final   Special Requests BOTTLES DRAWN AEROBIC AND ANAEROBIC [facility] EACH   Final   Culture  Setup Time     Final   Value: 11/12/2013 16:02     Performed at [facility]   Culture     Final   Value: GRAM POSITIVE COCCI IN CLUSTERS     GRAM NEGATIVE RODS     Note: Gram Stain Report Called to,Read Back By and Verified With: KIM CHEEK 11/13/13 @ 2:26PM BY RUSCOE A.     Performed at [facility]   Report Status PENDING   Incomplete  URINE CULTURE     Status: None    Collection Time    11/12/13  9:04 AM      Result Value Ref Range Status   Specimen Description URINE, CATHETERIZED   Final   Special Requests NONE   Final   Culture  Setup Time     Final   Value: 11/12/2013 16:26  Performed at [facility]     Final   Value: >=100,000 COLONIES/ML     Performed at [facility]   Culture     Final   Value: ESCHERICHIA COLI     Performed at [facility]   Report Status PENDING   Incomplete  CULTURE, BLOOD (ROUTINE X 2)     Status: None   Collection Time    11/12/13  9:15 AM      Result Value Ref Range Status   Specimen Description BLOOD RIGHT ANTECUBITAL   Final   Special Requests BOTTLES DRAWN AEROBIC AND ANAEROBIC [facility] EACH   Final   Culture  Setup Time     Final   Value: 11/12/2013 16:02     Performed at [facility]   Culture     Final   Value:        BLOOD CULTURE RECEIVED NO GROWTH TO DATE CULTURE WILL BE HELD FOR 5 DAYS BEFORE ISSUING A FINAL NEGATIVE REPORT     Performed at [facility]   Report Status PENDING   Incomplete  MRSA PCR SCREENING     Status: None   Collection Time    11/12/13  1:09 PM      Result Value Ref Range Status   MRSA by PCR NEGATIVE  NEGATIVE Final   Comment:            The GeneXpert MRSA Assay (FDA     approved for NASAL specimens     only), is one component of a     comprehensive MRSA colonization     surveillance program. It is not     intended to diagnose MRSA     infection nor to guide or     monitor treatment for     MRSA infections.  CULTURE, EXPECTORATED SPUTUM-ASSESSMENT     Status: None   Collection Time    11/13/13  7:30 AM      Result Value Ref Range Status   Specimen Description SPUTUM   Final   Special Requests NONE   Final   Sputum evaluation     Final   Value: THIS SPECIMEN IS ACCEPTABLE. RESPIRATORY CULTURE REPORT TO FOLLOW.   Report Status 11/13/2013 FINAL   Final  CULTURE, RESPIRATORY (NON-EXPECTORATED)     Status: None    Collection Time    11/13/13  7:30 AM      Result Value Ref Range Status   Specimen Description SPUTUM   Final   Special Requests NONE   Final   Gram Stain     Final   Value: MODERATE WBC PRESENT, PREDOMINANTLY PMN     ABUNDANT SQUAMOUS EPITHELIAL CELLS PRESENT     FEW GRAM NEGATIVE RODS     MODERATE GRAM POSITIVE COCCI IN PAIRS     RARE GRAM POSITIVE RODS   Culture PENDING   Incomplete   Report Status PENDING   Incomplete     Studies: Dg Chest Port 1 View  (if Code Sepsis Called)  11/12/2013   CLINICAL DATA:  One day history of chest pain.  Cough  EXAM: PORTABLE CHEST - 1 VIEW  COMPARISON:  February 27, 2013  FINDINGS: There is focal airspace consolidation in the periphery of the left upper lobe. There is underlying emphysema. Elsewhere the lungs appear clear. Heart is upper normal in size with pulmonary vascularity within normal limits. No adenopathy. There is arthropathy in the thoracic spine in both  shoulders.  IMPRESSION: Left upper lobe airspace consolidation. This opacity appears somewhat nodular. While it most likely represents pneumonia, followup images to clearing advised to exclude the possibility of underlying neoplasm in this area.   Electronically Signed   By: Lowella Grip M.D.   On: 11/12/2013 09:03    Scheduled Meds: . amLODipine  5 mg Oral Daily  . bisoprolol  10 mg Oral Daily  . doxazosin  8 mg Oral Daily  . enoxaparin (LOVENOX) injection  40 mg Subcutaneous Q24H  . furosemide  40 mg Oral Daily  . insulin aspart  0-20 Units Subcutaneous TID WC  . insulin aspart  0-5 Units Subcutaneous QHS  . insulin aspart  5 Units Subcutaneous TID WC  . insulin glargine  15 Units Subcutaneous Daily  . ipratropium  0.5 mg Nebulization Q4H  . levalbuterol  1.25 mg Nebulization Q4H  . loratadine  10 mg Oral Daily  . losartan  100 mg Oral Daily  . methylPREDNISolone (SOLU-MEDROL) injection  80 mg Intravenous Q12H  . multivitamin with minerals  1 tablet Oral Daily  . omeprazole   40 mg Oral QAC supper  . piperacillin-tazobactam (ZOSYN)  IV  3.375 g Intravenous Q8H  . simvastatin  20 mg Oral q1800  . vancomycin  750 mg Intravenous Q12H   Continuous Infusions:    Active Problems:   COPD (chronic obstructive pulmonary disease)   Diabetes mellitus   Hypertension   HCAP (healthcare-associated pneumonia)   Sepsis   Acute respiratory failure with hypoxia   COPD with acute exacerbation    Time spent: 30 min    Jaecob Lowden, [facility] Pager [phone]. If 7PM-7AM, please contact night-coverage at [email or web address], password [facility] 11/14/2013, 7:52 AM  LOS: 2 days

## 2013-11-14 NOTE — Progress Notes (Signed)
ANTIBIOTIC CONSULT NOTE - FOLLOW UP  Pharmacy Consult for Vancomycin/Zosyn Indication: rule out sepsis  Allergies  Allergen Reactions  . Adhesive [Tape]     unknown  . Aspirin     unknown  . Codeine     unknown  . Enalapril Cough  . Hydrocodone     unknown  . Pantoprazole Sodium     unknown    Patient Measurements: Height: 5\' 4"  (162.6 cm) Weight: 184 lb 8.4 oz (83.7 kg) IBW/kg (Calculated) : 54.7  Vital Signs: Temp: 98 F (36.7 C) (10/12 1132) Temp Source: Oral (10/12 1132) BP: 166/77 mmHg (10/12 1132) Pulse Rate: 90 (10/12 1132) Intake/Output from previous day: 10/11 0701 - 10/12 0700 In: 200 [IV Piggyback:200] Out: 1575 [Urine:1575] Intake/Output from this shift: Total I/O In: -  Out: 350 [Urine:350]  Labs:  Recent Labs  11/12/13 0840 11/13/13 0156 11/14/13 0555  WBC 22.1* 19.0* 17.8*  HGB 12.1 10.6* 10.9*  PLT 179 168 185  CREATININE 0.55 0.56 0.69   Estimated Creatinine Clearance: 45 ml/min (by C-G formula based on Cr of 0.69).  Recent Labs  11/14/13 1115  VANCOTROUGH 11.8     Assessment: 89 yoF admitted on 10/10 with c/o chest pain, cough, and urinary frequency. First dose of Zosyn given in ED. Pharmacy consulted to dose vancomycin and Zosyn for suspected sepsis.  10/10 >> Zosyn >> 10/10 >> Vanc >>   Tmax: AF x 24h WBCs: elevated Renal: SCr 0.69, CrCl ~48 ml/min (normalized, adjusted SCr for age), CrCl ~56 ml/min (CG, adjusted SCr) Lactic acid: 1.49 Vancomycin trough today 11.8 on 750 mg IV q12h dosing  10/10 blood x 2: 1/2 CoNS and GNR 10/12 blood x 1: sent 10/10 urine: >100k E.coli 10/10 resp virus panel: IP 10/11 sputum: pending 10/10 Legionella, S. pneumo urine ag: both neg  Today is day #3 vancomycin 750 mg IV q12h and Zosyn 3.375g IV q8h (4 hour infusion time) for HCAP, E.coli UTI, possible GNR bacteremia.  Vancomycin trough low today.  Blood cultures growing 1/2 CoNS likely contaminant.  Awaiting sputum cx results then d/c  vancomycin?  Goal of Therapy:  Vancomycin trough level 15-20 mcg/ml Doses adjusted per renal function Eradication of infection  Plan:  1.  Adjust vancomycin to 1g IV q12h. 2.  Continue Zosyn 3.375g IV q8h (4 hour infusion time).  3.  F/u cultures, SCr, de-escalation of antibiotics.  Hershal Coria 11/14/2013,12:00 PM

## 2013-11-15 ENCOUNTER — Encounter: Payer: Self-pay | Admitting: Internal Medicine

## 2013-11-15 DIAGNOSIS — I10 Essential (primary) hypertension: Secondary | ICD-10-CM

## 2013-11-15 DIAGNOSIS — A4150 Gram-negative sepsis, unspecified: Secondary | ICD-10-CM

## 2013-11-15 DIAGNOSIS — J9601 Acute respiratory failure with hypoxia: Secondary | ICD-10-CM

## 2013-11-15 DIAGNOSIS — J189 Pneumonia, unspecified organism: Secondary | ICD-10-CM

## 2013-11-15 DIAGNOSIS — E119 Type 2 diabetes mellitus without complications: Secondary | ICD-10-CM

## 2013-11-15 DIAGNOSIS — I059 Rheumatic mitral valve disease, unspecified: Secondary | ICD-10-CM

## 2013-11-15 DIAGNOSIS — N390 Urinary tract infection, site not specified: Secondary | ICD-10-CM

## 2013-11-15 DIAGNOSIS — J441 Chronic obstructive pulmonary disease with (acute) exacerbation: Secondary | ICD-10-CM

## 2013-11-15 DIAGNOSIS — N10 Acute tubulo-interstitial nephritis: Secondary | ICD-10-CM

## 2013-11-15 LAB — URINE CULTURE: Colony Count: >=100000

## 2013-11-15 LAB — GLUCOSE, CAPILLARY
GLUCOSE-CAPILLARY: 250 mg/dL — AB (ref 70–99)
GLUCOSE-CAPILLARY: 307 mg/dL — AB (ref 70–99)
Glucose-Capillary: 176 mg/dL — ABNORMAL HIGH (ref 70–99)
Glucose-Capillary: 168 mg/dL — ABNORMAL HIGH (ref 70–99)

## 2013-11-15 LAB — BASIC METABOLIC PANEL
Anion gap: 12 (ref 5–15)
BUN: 19 mg/dL (ref 6–23)
CALCIUM: 8.3 mg/dL — AB (ref 8.4–10.5)
CHLORIDE: 100 meq/L (ref 96–112)
CO2: 26 mEq/L (ref 19–32)
Creatinine, Ser: 0.62 mg/dL (ref 0.50–1.10)
GFR calc Af Amer: 87 mL/min — ABNORMAL LOW (ref >90)
GFR calc non Af Amer: 75 mL/min — ABNORMAL LOW (ref >90)
Glucose, Bld: 249 mg/dL — ABNORMAL HIGH (ref 70–99)
Potassium: 3.5 mEq/L — ABNORMAL LOW (ref 3.7–5.3)
Sodium: 138 mEq/L (ref 137–147)

## 2013-11-15 LAB — CULTURE, RESPIRATORY

## 2013-11-15 LAB — CBC
HCT: 32.7 % — ABNORMAL LOW (ref 36.0–46.0)
HEMOGLOBIN: 10.8 g/dL — AB (ref 12.0–15.0)
MCH: 30.6 pg (ref 26.0–34.0)
MCHC: 33 g/dL (ref 30.0–36.0)
MCV: 92.6 fL (ref 78.0–100.0)
Platelets: 208 10*3/uL (ref 150–400)
RBC: 3.53 MIL/uL — ABNORMAL LOW (ref 3.87–5.11)
RDW: 14.1 % (ref 11.5–15.5)
WBC: 14.2 10*3/uL — ABNORMAL HIGH (ref 4.0–10.5)

## 2013-11-15 LAB — CULTURE, RESPIRATORY W GRAM STAIN

## 2013-11-15 LAB — PRO B NATRIURETIC PEPTIDE: PRO B NATRI PEPTIDE: 3057 pg/mL — AB (ref 0–450)

## 2013-11-15 MED ORDER — LEVALBUTEROL HCL 1.25 MG/0.5ML IN NEBU
1.2500 mg | INHALATION_SOLUTION | Freq: Four times a day (QID) | RESPIRATORY_TRACT | Status: DC
  Administered 2013-11-16 (×4): 1.25 mg via RESPIRATORY_TRACT
  Filled 2013-11-15 (×6): qty 0.5

## 2013-11-15 MED ORDER — IPRATROPIUM BROMIDE 0.02 % IN SOLN
0.5000 mg | Freq: Four times a day (QID) | RESPIRATORY_TRACT | Status: DC
  Administered 2013-11-16 (×4): 0.5 mg via RESPIRATORY_TRACT
  Filled 2013-11-15 (×4): qty 2.5

## 2013-11-15 MED ORDER — PREDNISONE 50 MG PO TABS
60.0000 mg | ORAL_TABLET | Freq: Every day | ORAL | Status: DC
  Administered 2013-11-16 – 2013-11-17 (×2): 60 mg via ORAL
  Filled 2013-11-15 (×3): qty 1

## 2013-11-15 MED ORDER — AMOXICILLIN-POT CLAVULANATE 875-125 MG PO TABS
1.0000 | ORAL_TABLET | Freq: Two times a day (BID) | ORAL | Status: DC
  Administered 2013-11-15 – 2013-11-16 (×3): 1 via ORAL
  Filled 2013-11-15 (×6): qty 1

## 2013-11-15 MED ORDER — LEVALBUTEROL HCL 1.25 MG/0.5ML IN NEBU
1.2500 mg | INHALATION_SOLUTION | Freq: Four times a day (QID) | RESPIRATORY_TRACT | Status: DC
  Filled 2013-11-15 (×2): qty 0.5

## 2013-11-15 MED ORDER — HYDRALAZINE HCL 25 MG PO TABS
25.0000 mg | ORAL_TABLET | Freq: Three times a day (TID) | ORAL | Status: DC
  Administered 2013-11-15 – 2013-11-17 (×6): 25 mg via ORAL
  Filled 2013-11-15 (×8): qty 1

## 2013-11-15 NOTE — Progress Notes (Addendum)
Clinical Social Work Department CLINICAL SOCIAL WORK PLACEMENT NOTE 11/15/2013  Patient:  LEGACY, CARRENDER  Account Number:  [id number] [place] date:  11/12/2013  Clinical Social Worker:  Sindy Messing, LCSW  Date/time:  11/15/2013 02:30 PM  Clinical Social Work is seeking post-discharge placement for this patient at the following level of care:   SKILLED NURSING   (*CSW will update this form in Epic as items are completed)   11/15/2013  Patient/family provided with [place] Department of Clinical Social Work's list of facilities offering this level of care within the geographic area requested by the patient (or if unable, by the patient's family).  11/15/2013  Patient/family informed of their freedom to choose among providers that offer the needed level of care, that participate in Medicare, Medicaid or managed care program needed by the patient, have an available bed and are willing to accept the patient.  11/15/2013  Patient/family informed of [facility]' ownership interest in [facility], as well as of the fact that they are under no obligation to receive care at this facility.  PASARR submitted to EDS on 11/15/2013   PASARR number received on 11/15/2013    FL2 transmitted to all facilities in geographic area requested by pt/family on  11/15/2013 FL2 transmitted to all facilities within larger geographic area on   Patient informed that his/her managed care company has contracts with or will negotiate with  certain facilities, including the following:     Patient/family informed of bed offers received:   Patient chooses bed at  Physician recommends and patient chooses bed at    Patient to be transferred to  on   Patient to be transferred to facility by  Patient and family notified of transfer on  Name of family member notified:    The following physician request were entered in Epic:   Additional Comments:

## 2013-11-15 NOTE — Consult Note (Signed)
[facility]    Date of Admission:  11/12/2013  Date of Consult:  11/15/2013  Reason for Consult:PNA, +/- UTI and + blood culture Referring Physician: Dr. Sheran Fava   HPI: [name] is an 80 y.o. female with DM, COPD, CAD, CHF admitted with chest pain cough, malaise and increased urinary frequency. She was brought from home to ED where she was Hypertensive , but febrrile with high WBC. CXR read with left lateral mid lung opacity. UA with 11-20 wbc, urine culture with pan sensitive E coli. She  Is growing a COag neg species and a GNR in 1/2 admission blood cultures. She has been given IV vancomycin and zosyn for 4 days.   Past Medical History  Diagnosis Date  . Type II or unspecified type diabetes mellitus without mention of complication, not stated as uncontrolled   . Hypertension   . Arthritis   . Ulcer   . COPD (chronic obstructive pulmonary disease)   . Regional enteritis of unspecified site   . Other malaise and fatigue   . Anemia, unspecified   . Other and unspecified hyperlipidemia   . Hypoxemia   . Pneumonia, organism unspecified   . Mucopurulent chronic bronchitis   . Palpitations   . Nontoxic uninodular goiter   . Abnormality of gait   . Fall from other slipping, tripping, or stumbling   . Cholelithiases   . Closed fracture of unspecified part of ramus of mandible   . Disturbance of skin sensation   . Sciatica   . Tension headache   . Coronary atherosclerosis of unspecified type of vessel, native or graft   . Osteoarthrosis, unspecified whether generalized or localized, unspecified site   . Insomnia, unspecified   . Edema   . Tension headache   . Pain in joint, lower leg 2010    right knee  . Unspecified venous (peripheral) insufficiency   . Esophageal reflux   . Rosacea   . Cataract 1990    Dr. Katy Fitch  . Pain in joint, shoulder region 2013    left  . Gastric ulcer with hemorrhage 2008  . Diabetes mellitus type 2 in nonobese      Past Surgical History  Procedure Laterality Date  . Total knee arthroplasty Bilateral 1993, 1998    knees  . Other surgical history  2008    Ulcer surgery   . Small intestine surgery    . Eye surgery Bilateral 1990    Dr. Katy Fitch  . Gastrojejunostomy  05/2004    secondary to ulcer  . Joint replacement Bilateral 1993-1998    total knee replacement  ergies:   Allergies  Allergen Reactions  . Adhesive [Tape]     unknown  . Aspirin     unknown  . Codeine     unknown  . Enalapril Cough  . Hydrocodone     unknown  . Pantoprazole Sodium     unknown     Medications: I have reviewed patients current medications as documented in Epic Anti-infectives   Start     Dose/Rate Route Frequency Ordered Stop   11/15/13 1800  amoxicillin-clavulanate (AUGMENTIN) 875-125 MG per tablet 1 tablet     1 tablet Oral 2 times daily with meals 11/15/13 1657     11/14/13 1300  vancomycin (VANCOCIN) IVPB 1000 mg/200 mL premix  Status:  Discontinued     1,000 mg 200 mL/hr over 60 Minutes Intravenous Every 12 hours 11/14/13 1200 11/15/13 1656  11/13/13 0000  vancomycin (VANCOCIN) IVPB 750 mg/150 ml premix  Status:  Discontinued     750 mg 150 mL/hr over 60 Minutes Intravenous Every 12 hours 11/12/13 1009 11/14/13 1200   11/12/13 1600  piperacillin-tazobactam (ZOSYN) IVPB 3.375 g  Status:  Discontinued     3.375 g 12.5 mL/hr over 240 Minutes Intravenous Every 8 hours 11/12/13 1229 11/15/13 1656   11/12/13 0900  vancomycin (VANCOCIN) 1,250 mg in sodium chloride 0.9 % 250 mL IVPB     1,250 mg 166.7 mL/hr over 90 Minutes Intravenous STAT 11/12/13 0853 11/12/13 1144   11/12/13 0845  piperacillin-tazobactam (ZOSYN) IVPB 3.375 g     3.375 g 12.5 mL/hr over 240 Minutes Intravenous  Once 11/12/13 0839 11/12/13 1318      Social History:  reports that she quit smoking about 40 years ago. Her smoking use included Cigarettes. She has a 3 pack-year smoking history. She has never used smokeless tobacco.  She reports that she does not drink alcohol or use illicit drugs.  Family History  Problem Relation Age of Onset  . Heart disease Father   . Colon cancer Sister   . Diabetes Brother   . Obesity Brother   . Mental retardation Daughter   . Obesity Daughter     As in HPI and primary teams notes otherwise 12 point review of systems is negative  Blood pressure 134/55, pulse 68, temperature 97.4 F (36.3 C), temperature source Oral, resp. rate 20, height _0  (1.626 m), weight 184 lb 8.4 oz (83.7 kg), SpO2 96.00%.   General: Alert and awake, oriented x3, not in any acute distress. HEENT: anicteric sclera, pupils reactive to light and accommodation, EOMI, oropharynx clear and without exudate CVS regular rate, normal r,  no murmur rubs or gallops Chest: wheezes posteriorly at left base Abdomen: soft nontender, nondistended, normal bowel sounds, Extremities: no  clubbing or edema noted bilaterally Neuro: nonfocal, strength and sensation intact   Results for orders placed during the hospital encounter of 11/12/13 (from the past 48 hour(s))  GLUCOSE, CAPILLARY     Status: Abnormal   Collection Time    11/13/13  9:46 PM      Result Value Ref Range   Glucose-Capillary 244 (*) 70 - 99 mg/dL   Comment 1 Notify RN    BASIC METABOLIC PANEL     Status: Abnormal   Collection Time    11/14/13  5:55 AM      Result Value Ref Range   Sodium 133 (*) 137 - 147 mEq/L   Potassium 3.9  3.7 - 5.3 mEq/L   Chloride 97  96 - 112 mEq/L   CO2 25  19 - 32 mEq/L   Glucose, Bld 282 (*) 70 - 99 mg/dL   BUN 17  6 - 23 mg/dL   Creatinine, Ser 0.69  0.50 - 1.10 mg/dL   Calcium 8.7  8.4 - 10.5 mg/dL   GFR calc non Af Amer 72 (*) >90 mL/min   GFR calc Af Amer 84 (*) >90 mL/min   Comment: (NOTE)     The eGFR has been calculated using the CKD EPI equation.     This calculation has not been validated in all clinical situations.     eGFR's persistently <90 mL/min signify possible Chronic Kidney     Disease.    Anion gap 11  5 - 15  CBC     Status: Abnormal   Collection Time    11/14/13  5:55 AM  Result Value Ref Range   WBC 17.8 (*) 4.0 - 10.5 K/uL   RBC 3.49 (*) 3.87 - 5.11 MIL/uL   Hemoglobin 10.9 (*) 12.0 - 15.0 g/dL   HCT 32.4 (*) 36.0 - 46.0 %   MCV 92.8  78.0 - 100.0 fL   MCH 31.2  26.0 - 34.0 pg   MCHC 33.6  30.0 - 36.0 g/dL   RDW 14.0  11.5 - 15.5 %   Platelets 185  150 - 400 K/uL  CULTURE, BLOOD (SINGLE)     Status: None   Collection Time    11/14/13  5:55 AM      Result Value Ref Range   Specimen Description BLOOD RIGHT ARM     Special Requests BOTTLES DRAWN AEROBIC AND ANAEROBIC 5CC     Culture  Setup Time       Value: 11/14/2013 10:22     Performed at [facility]   Culture       Value:        BLOOD CULTURE RECEIVED NO GROWTH TO DATE CULTURE WILL BE HELD FOR 5 DAYS BEFORE ISSUING A FINAL NEGATIVE REPORT     Performed at [facility]   Report Status PENDING    GLUCOSE, CAPILLARY     Status: Abnormal   Collection Time    11/14/13  7:37 AM      Result Value Ref Range   Glucose-Capillary 270 (*) 70 - 99 mg/dL   Comment 1 Documented in Chart     Comment 2 Notify RN    VANCOMYCIN, TROUGH     Status: None   Collection Time    11/14/13 11:15 AM      Result Value Ref Range   Vancomycin Tr 11.8  10.0 - 20.0 ug/mL  GLUCOSE, CAPILLARY     Status: Abnormal   Collection Time    11/14/13 12:26 PM      Result Value Ref Range   Glucose-Capillary 387 (*) 70 - 99 mg/dL  GLUCOSE, CAPILLARY     Status: Abnormal   Collection Time    11/14/13  4:47 PM      Result Value Ref Range   Glucose-Capillary 229 (*) 70 - 99 mg/dL  GLUCOSE, CAPILLARY     Status: Abnormal   Collection Time    11/14/13  9:31 PM      Result Value Ref Range   Glucose-Capillary 116 (*) 70 - 99 mg/dL  BASIC METABOLIC PANEL     Status: Abnormal   Collection Time    11/15/13  4:58 AM      Result Value Ref Range   Sodium 138  137 - 147 mEq/L   Potassium 3.5 (*) 3.7 - 5.3 mEq/L   Chloride  100  96 - 112 mEq/L   CO2 26  19 - 32 mEq/L   Glucose, Bld 249 (*) 70 - 99 mg/dL   BUN 19  6 - 23 mg/dL   Creatinine, Ser 0.62  0.50 - 1.10 mg/dL   Calcium 8.3 (*) 8.4 - 10.5 mg/dL   GFR calc non Af Amer 75 (*) >90 mL/min   GFR calc Af Amer 87 (*) >90 mL/min   Comment: (NOTE)     The eGFR has been calculated using the CKD EPI equation.     This calculation has not been validated in all clinical situations.     eGFR's persistently <90 mL/min signify possible Chronic Kidney     Disease.   Anion gap 12  5 - 15  CBC     Status: Abnormal   Collection Time    11/15/13  4:58 AM      Result Value Ref Range   WBC 14.2 (*) 4.0 - 10.5 K/uL   RBC 3.53 (*) 3.87 - 5.11 MIL/uL   Hemoglobin 10.8 (*) 12.0 - 15.0 g/dL   HCT 32.7 (*) 36.0 - 46.0 %   MCV 92.6  78.0 - 100.0 fL   MCH 30.6  26.0 - 34.0 pg   MCHC 33.0  30.0 - 36.0 g/dL   RDW 14.1  11.5 - 15.5 %   Platelets 208  150 - 400 K/uL  PRO B NATRIURETIC PEPTIDE     Status: Abnormal   Collection Time    11/15/13  4:58 AM      Result Value Ref Range   Pro B Natriuretic peptide (BNP) 3057.0 (*) 0 - 450 pg/mL  GLUCOSE, CAPILLARY     Status: Abnormal   Collection Time    11/15/13  7:43 AM      Result Value Ref Range   Glucose-Capillary 250 (*) 70 - 99 mg/dL   Comment 1 Notify RN    GLUCOSE, CAPILLARY     Status: Abnormal   Collection Time    11/15/13 12:04 PM      Result Value Ref Range   Glucose-Capillary 307 (*) 70 - 99 mg/dL   Comment 1 Notify RN    GLUCOSE, CAPILLARY     Status: Abnormal   Collection Time    11/15/13  5:08 PM      Result Value Ref Range   Glucose-Capillary 176 (*) 70 - 99 mg/dL   Comment 1 Notify RN     _0 (sdes,specrequest,cult,reptstatus)   ) Recent Results (from the past 720 hour(s))  CULTURE, BLOOD (ROUTINE X 2)     Status: None   Collection Time    11/12/13  8:40 AM      Result Value Ref Range Status   Specimen Description BLOOD RIGHT FOREARM   Final   Special Requests BOTTLES DRAWN AEROBIC  AND ANAEROBIC [facility] EACH   Final   Culture  Setup Time     Final   Value: 11/12/2013 16:02     Performed at [facility]   Culture     Final   Value: STAPHYLOCOCCUS SPECIES (COAGULASE NEGATIVE)     Note: THE SIGNIFICANCE OF ISOLATING THIS ORGANISM FROM A SINGLE SET OF BLOOD CULTURES WHEN MULTIPLE SETS ARE DRAWN IS UNCERTAIN. PLEASE NOTIFY THE MICROBIOLOGY DEPARTMENT WITHIN ONE WEEK IF SPECIATION AND SENSITIVITIES ARE REQUIRED.     GRAM NEGATIVE RODS     Note: Gram Stain Report Called to,Read Back By and Verified With: KIM CHEEK 11/13/13 @ 2:26PM BY RUSCOE A.     Performed at [facility]   Report Status PENDING   Incomplete  URINE CULTURE     Status: None   Collection Time    11/12/13  9:04 AM      Result Value Ref Range Status   Specimen Description URINE, CATHETERIZED   Final   Special Requests NONE   Final   Culture  Setup Time     Final   Value: 11/12/2013 16:26     Performed at [facility]     Final   Value: >=100,000 COLONIES/ML     Performed at [facility]   Culture     Final   Value: ESCHERICHIA COLI  Performed at [facility]   Report Status 11/15/2013 FINAL   Final   Organism ID, Bacteria ESCHERICHIA COLI   Final  CULTURE, BLOOD (ROUTINE X 2)     Status: None   Collection Time    11/12/13  9:15 AM      Result Value Ref Range Status   Specimen Description BLOOD RIGHT ANTECUBITAL   Final   Special Requests BOTTLES DRAWN AEROBIC AND ANAEROBIC [employer] EACH   Final   Culture  Setup Time     Final   Value: 11/12/2013 16:02     Performed at [facility]   Culture     Final   Value:        BLOOD CULTURE RECEIVED NO GROWTH TO DATE CULTURE WILL BE HELD FOR 5 DAYS BEFORE ISSUING A FINAL NEGATIVE REPORT     Performed at [facility]   Report Status PENDING   Incomplete  RESPIRATORY VIRUS PANEL     Status: None   Collection Time    11/12/13  1:06 PM      Result Value Ref Range Status   Source - RVPAN NOSE    Final   Respiratory Syncytial Virus A NOT DETECTED   Final   Respiratory Syncytial Virus B NOT DETECTED   Final   Influenza A NOT DETECTED   Final   Influenza B NOT DETECTED   Final   Parainfluenza 1 NOT DETECTED   Final   Parainfluenza 2 NOT DETECTED   Final   Parainfluenza 3 NOT DETECTED   Final   Metapneumovirus NOT DETECTED   Final   Rhinovirus NOT DETECTED   Final   Adenovirus NOT DETECTED   Final   Influenza A H1 NOT DETECTED   Final   Influenza A H3 NOT DETECTED   Final   Comment: (NOTE)           Normal Reference Range for each Analyte: NOT DETECTED     Testing performed using the Luminex xTAG Respiratory Viral Panel test     kit.     The analytical performance characteristics of this assay have been     determined by [facility].  The modifications have not been     cleared or approved by the FDA. This assay has been validated pursuant     to the CLIA regulations and is used for clinical purposes.     Performed at [facility] PCR SCREENING     Status: None   Collection Time    11/12/13  1:09 PM      Result Value Ref Range Status   MRSA by PCR NEGATIVE  NEGATIVE Final   Comment:            The GeneXpert MRSA Assay (FDA     approved for NASAL specimens     only), is one component of a     comprehensive MRSA colonization     surveillance program. It is not     intended to diagnose MRSA     infection nor to guide or     monitor treatment for     MRSA infections.  CULTURE, EXPECTORATED SPUTUM-ASSESSMENT     Status: None   Collection Time    11/13/13  7:30 AM      Result Value Ref Range Status   Specimen Description SPUTUM   Final   Special Requests NONE   Final   Sputum evaluation     Final  Value: THIS SPECIMEN IS ACCEPTABLE. RESPIRATORY CULTURE REPORT TO FOLLOW.   Report Status 11/13/2013 FINAL   Final  CULTURE, RESPIRATORY (NON-EXPECTORATED)     Status: None   Collection Time    11/13/13  7:30 AM      Result Value Ref Range Status    Specimen Description SPUTUM   Final   Special Requests NONE   Final   Gram Stain     Final   Value: MODERATE WBC PRESENT, PREDOMINANTLY PMN     ABUNDANT SQUAMOUS EPITHELIAL CELLS PRESENT     FEW GRAM NEGATIVE RODS     MODERATE GRAM POSITIVE COCCI IN PAIRS     RARE GRAM POSITIVE RODS   Culture     Final   Value: MODERATE YEAST CONSISTENT WITH CANDIDA SPECIES     Performed at [facility]   Report Status 11/15/2013 FINAL   Final  CULTURE, BLOOD (SINGLE)     Status: None   Collection Time    11/14/13  5:55 AM      Result Value Ref Range Status   Specimen Description BLOOD RIGHT ARM   Final   Special Requests BOTTLES DRAWN AEROBIC AND ANAEROBIC 5CC   Final   Culture  Setup Time     Final   Value: 11/14/2013 10:22     Performed at [facility]   Culture     Final   Value:        BLOOD CULTURE RECEIVED NO GROWTH TO DATE CULTURE WILL BE HELD FOR 5 DAYS BEFORE ISSUING A FINAL NEGATIVE REPORT     Performed at [facility]   Report Status PENDING   Incomplete     Impression/Recommendation  Principal Problem:   Septicemia due to Gram negative organism Active Problems:   Diabetes mellitus   Hypertension   HCAP (healthcare-associated pneumonia)   Acute respiratory failure with hypoxia   COPD with acute exacerbation   Acute pyelonephritis   HAIZLEY CANNELLA is a 80 y.o. female with  Admission for possible PNA, UTI found to have coag neg staph and GNR in 1/2 admission blood cultures  #1 UTI +/- CAP: E coli is very sensitive to abx  Will change her to oral augmentin and would have her take 3 more days of abx  (could even consider 5 day course, only one more day)  #2 Coag neg staph and GNR in 1/2 cxs= contaminants  I will sign off for now     11/15/2013, 5:36 PM   Thank you so much for this interesting consult  [place] [facility] [place] (pager) [phone] (office) 11/15/2013, 5:36 PM  [place] 11/15/2013, 5:36 PM

## 2013-11-15 NOTE — Progress Notes (Addendum)
[facility] PROGRESS NOTE  [name] WUJ:[id number] DOB: 1917/07/21 DOA: 11/12/2013 PCP: Estill Dooms, MD  Brief Summary  The patient is a 80 y.o. year-old female with history of COPD with exertional hypoxia at baseline to the mid-80s on RA, not on oxygen because she rarely exerts herself and followed by Dr. Gwenette Greet, DM, CAD, GERD with hx of bleeding ulcer, arthritis, hearing impairment who presents with cough, SOB, and urinary frequency. She lives at [place] and gets around with electric scooter or rolling walker. She presented with cough productive of clear phlegm, SOB, pleuritic 10/10 left sided chest pain, and urinary urgency/frequency. In the ER, she was febrile to 101.87F, HR 130s sinus tach, and was found to have LUL PNA and UTI.  She was started on vanc and zosyn and admitted to stepdown.    10/10 Respiratory distress upon admission to stepdown requiring bipap.  Also had episode of nonresponsiveness that self-resolved 10/11 Blood cultures positive for GPC in clusters AND GNR 10/12 Persistent respiratory distress 10/13  Urine culture speciated to E. Coli sens to amp.  ID consulted, steroids finally able to be tapered  Assessment/Plan  Sepsis and septicemia (fever, WBC, tachycardia, tachypnea) due to HCAP and UTI.  Temperature and WBC trending down.   - 1/2 Blood cultures growing GPC in clusters AND GNR  - Urine culture:  E. Coli.  pansensitive - Legionella, S. pneumo urine ag both negative - Sputum culture:  moderate yeast (has thrush) - Resp viral panel neg - Continue Vancomycin and zosyn day 4 - mucolytics   Coag neg staph and GNR  -  Repeat blood cultures: NGTD -  F/u speciation:  Coag neg staph and GNR -  ID consult given two bacteria from same blood culture.  Suspect coag neg staph may be contaminate and GNR real  Acute hypoxic resp failure secondary to HCAP and acute COPD exacerbation, still wheezing - Repeat CXR:  Persistent left mid-lung  consolidation, diffuse bilateral opacities c/w interstitial pneumonitis or edema - D/c solumedrol and start prednisone - Duonebs q4h  - tx for pneumonia as above  - Flutter valve  - Wean oxygen as tolerated  - Continue lasix - ECHO and BNP - May need Pulm consult depending on progression  Pleuritic chest pain likely related to pneumonia, resolved  Sinus tachycardia likely secondary to sepsis, resolved - Telemetry:  MAT, sinus tach, now NSR with occasional PVC and NSVT - troponins neg  HTN/HLD, blood pressure labile and high.   -  Taper steroids -  Continue norvasc 10 mg daily, bisoprolol 32m daily, ARB, and doxazosin  -  On lasix -  Add hydralazine 272mpo TID -  Continue prn metoprolol -  Continue statin   DM, hyperglycemia likely due to steroids, A1c 7.2 - Hold oral meds  - SSI  - continue lantus 15 units - continue aspart 5 units with meals -  Tapering steroids so monitor for decline in CBG  Leukocytosis due to multiple infections and steroids, trending down - Trend WBC   Normocytic anemia, likely secondary to marrow suppressions from acute illness or side effect of zosyn, stable around 10.29m35ml  Hyponatremia, mild and asymptomatic and may be secondary to SIADH from pneumonia  - Trend sodium  - No fluid restrictions at this time   GERD w/ hx of bleeding ulcers, stable. Continue omeprazole   Generalized weakness.  PT/OT consulted  Diet: Diabetic  Access: PIV  IVF: off  Proph: lovenox   Code Status:  DNR/DNI but okay with bipap  Family Communication: patient, her son and daughter-in-law  Disposition Plan:  ID consult pending.  ECHO and may need pulm consult  Consultants:  none  Procedures:  CXR  Antibiotics:  vanc 10/10 >>  Zosyn 10/10 >>   HPI/Subjective:  Breathing continues to improve.  Denies nausea, vomiting, constipation, diarrhea, chills.    Objective: Filed Vitals:   11/15/13 0334 11/15/13 0552 11/15/13 0901 11/15/13 1227  BP:   184/72    Pulse:  88    Temp:  97.8 F (36.6 C)    TempSrc:  Oral    Resp:  18    Height:      Weight:      SpO2: 98% 98% 94% 92%    Intake/Output Summary (Last 24 hours) at 11/15/13 1248 Last data filed at 11/15/13 0900  Gross per 24 hour  Intake    240 ml  Output      2 ml  Net    238 ml   Filed Weights   11/12/13 0834 11/12/13 1248 11/14/13 0245  Weight: 80.74 kg (178 lb) 80 kg (176 lb 5.9 oz) 83.7 kg (184 lb 8.4 oz)    Exam:   General:  WF, with faint SCM retractions at rest  HEENT:  NCAT, MMM  Cardiovascular:  RRR, nl S1, S2 no mrg, 2+ pulses, warm extremities  Respiratory:   Rales anterior lateral left chest and anterior right chest, diminished BS but improved wheeze since yesterday, no rhonchi   Abdomen:   NABS, soft, NT/ND  MSK:   Normal tone and bulk, no LEE  Neuro:  Grossly intact  Data Reviewed: Basic Metabolic Panel:  Recent Labs Lab 11/12/13 0840 11/13/13 0156 11/14/13 0555 11/15/13 0458  NA 136* 135* 133* 138  K 3.9 3.8 3.9 3.5*  CL 96 100 97 100  CO2 '24 24 25 26  ' GLUCOSE 203* 290* 282* 249*  BUN '14 11 17 19  ' CREATININE 0.55 0.56 0.69 0.62  CALCIUM 8.9 8.0* 8.7 8.3*   Liver Function Tests:  Recent Labs Lab 11/12/13 0840  AST 15  ALT 11  ALKPHOS 85  BILITOT 0.5  PROT 6.8  ALBUMIN 3.5   No results found for this basename: LIPASE, AMYLASE,  in the last 168 hours No results found for this basename: AMMONIA,  in the last 168 hours CBC:  Recent Labs Lab 11/12/13 0840 11/13/13 0156 11/14/13 0555 11/15/13 0458  WBC 22.1* 19.0* 17.8* 14.2*  NEUTROABS 20.1*  --   --   --   HGB 12.1 10.6* 10.9* 10.8*  HCT 37.0 31.4* 32.4* 32.7*  MCV 95.4 93.2 92.8 92.6  PLT 179 168 185 208   Cardiac Enzymes:  Recent Labs Lab 11/12/13 0840 11/12/13 1345 11/12/13 1930 11/13/13 0156  TROPONINI <0.30 <0.30 <0.30 <0.30   BNP (last 3 results) No results found for this basename: PROBNP,  in the last 8760 hours CBG:  Recent Labs Lab  11/14/13 1226 11/14/13 1647 11/14/13 2131 11/15/13 0743 11/15/13 1204  GLUCAP 387* 229* 116* 250* 307*    Recent Results (from the past 240 hour(s))  CULTURE, BLOOD (ROUTINE X 2)     Status: None   Collection Time    11/12/13  8:40 AM      Result Value Ref Range Status   Specimen Description BLOOD RIGHT FOREARM   Final   Special Requests BOTTLES DRAWN AEROBIC AND ANAEROBIC [facility] EACH   Final   Culture  Setup Time  Final   Value: 11/12/2013 16:02     Performed at [facility]   Culture     Final   Value: STAPHYLOCOCCUS SPECIES (COAGULASE NEGATIVE)     Note: THE SIGNIFICANCE OF ISOLATING THIS ORGANISM FROM A SINGLE SET OF BLOOD CULTURES WHEN MULTIPLE SETS ARE DRAWN IS UNCERTAIN. PLEASE NOTIFY THE MICROBIOLOGY DEPARTMENT WITHIN ONE WEEK IF SPECIATION AND SENSITIVITIES ARE REQUIRED.     GRAM NEGATIVE RODS     Note: Gram Stain Report Called to,Read Back By and Verified With: KIM CHEEK 11/13/13 @ 2:26PM BY RUSCOE A.     Performed at [facility]   Report Status PENDING   Incomplete  URINE CULTURE     Status: None   Collection Time    11/12/13  9:04 AM      Result Value Ref Range Status   Specimen Description URINE, CATHETERIZED   Final   Special Requests NONE   Final   Culture  Setup Time     Final   Value: 11/12/2013 16:26     Performed at [place]     Final   Value: >=100,000 COLONIES/ML     Performed at [facility]   Culture     Final   Value: ESCHERICHIA COLI     Performed at [facility]   Report Status 11/15/2013 FINAL   Final   Organism ID, Bacteria ESCHERICHIA COLI   Final  CULTURE, BLOOD (ROUTINE X 2)     Status: None   Collection Time    11/12/13  9:15 AM      Result Value Ref Range Status   Specimen Description BLOOD RIGHT ANTECUBITAL   Final   Special Requests BOTTLES DRAWN AEROBIC AND ANAEROBIC [facility] EACH   Final   Culture  Setup Time     Final   Value: 11/12/2013 16:02     Performed at [facility]   Culture     Final   Value:        BLOOD CULTURE RECEIVED NO GROWTH TO DATE CULTURE WILL BE HELD FOR 5 DAYS BEFORE ISSUING A FINAL NEGATIVE REPORT     Performed at [facility]   Report Status PENDING   Incomplete  RESPIRATORY VIRUS PANEL     Status: None   Collection Time    11/12/13  1:06 PM      Result Value Ref Range Status   Source - RVPAN NOSE   Final   Respiratory Syncytial Virus A NOT DETECTED   Final   Respiratory Syncytial Virus B NOT DETECTED   Final   Influenza A NOT DETECTED   Final   Influenza B NOT DETECTED   Final   Parainfluenza 1 NOT DETECTED   Final   Parainfluenza 2 NOT DETECTED   Final   Parainfluenza 3 NOT DETECTED   Final   Metapneumovirus NOT DETECTED   Final   Rhinovirus NOT DETECTED   Final   Adenovirus NOT DETECTED   Final   Influenza A H1 NOT DETECTED   Final   Influenza A H3 NOT DETECTED   Final   Comment: (NOTE)           Normal Reference Range for each Analyte: NOT DETECTED     Testing performed using the Luminex xTAG Respiratory Viral Panel test     kit.     The analytical performance characteristics of this assay have been     determined  by [facility].  The modifications have not been     cleared or approved by the FDA. This assay has been validated pursuant     to the CLIA regulations and is used for clinical purposes.     Performed at [place] PCR SCREENING     Status: None   Collection Time    11/12/13  1:09 PM      Result Value Ref Range Status   MRSA by PCR NEGATIVE  NEGATIVE Final   Comment:            The GeneXpert MRSA Assay (FDA     approved for NASAL specimens     only), is one component of a     comprehensive MRSA colonization     surveillance program. It is not     intended to diagnose MRSA     infection nor to guide or     monitor treatment for     MRSA infections.  CULTURE, EXPECTORATED SPUTUM-ASSESSMENT     Status: None   Collection Time    11/13/13  7:30 AM      Result Value  Ref Range Status   Specimen Description SPUTUM   Final   Special Requests NONE   Final   Sputum evaluation     Final   Value: THIS SPECIMEN IS ACCEPTABLE. RESPIRATORY CULTURE REPORT TO FOLLOW.   Report Status 11/13/2013 FINAL   Final  CULTURE, RESPIRATORY (NON-EXPECTORATED)     Status: None   Collection Time    11/13/13  7:30 AM      Result Value Ref Range Status   Specimen Description SPUTUM   Final   Special Requests NONE   Final   Gram Stain     Final   Value: MODERATE WBC PRESENT, PREDOMINANTLY PMN     ABUNDANT SQUAMOUS EPITHELIAL CELLS PRESENT     FEW GRAM NEGATIVE RODS     MODERATE GRAM POSITIVE COCCI IN PAIRS     RARE GRAM POSITIVE RODS   Culture     Final   Value: MODERATE YEAST CONSISTENT WITH CANDIDA SPECIES     Performed at [facility]   Report Status 11/15/2013 FINAL   Final  CULTURE, BLOOD (SINGLE)     Status: None   Collection Time    11/14/13  5:55 AM      Result Value Ref Range Status   Specimen Description BLOOD RIGHT ARM   Final   Special Requests BOTTLES DRAWN AEROBIC AND ANAEROBIC 5CC   Final   Culture  Setup Time     Final   Value: 11/14/2013 10:22     Performed at [facility]   Culture     Final   Value:        BLOOD CULTURE RECEIVED NO GROWTH TO DATE CULTURE WILL BE HELD FOR 5 DAYS BEFORE ISSUING A FINAL NEGATIVE REPORT     Performed at [facility]   Report Status PENDING   Incomplete     Studies: Dg Chest Port 1 View  11/14/2013   CLINICAL DATA:  Productive cough, shortness of breath, left-sided pleuritic chest pain, and fever. Sepsis.  EXAM: PORTABLE CHEST - 1 VIEW  COMPARISON:  11/12/2013  FINDINGS: Cardiac silhouette remains upper limits of normal in size, unchanged. Confluent opacity is again seen in the lateral left mid lung, similar to the prior study. Diffuse bilateral interstitial opacities have increased, greatest in the lung bases. Small bilateral pleural  effusions are not excluded. No pneumothorax is identified.  Degenerative changes are again seen of the left glenohumeral joint.  IMPRESSION: Persistent left mid lung consolidation, consistent with pneumonia. Increasing, diffuse bilateral interstitial opacities could reflect interstitial pneumonitis or edema.   Electronically Signed   By: Logan Bores   On: 11/14/2013 08:29    Scheduled Meds: . amLODipine  10 mg Oral Daily  . bisoprolol  10 mg Oral Daily  . chlorhexidine  15 mL Mouth Rinse BID  . doxazosin  8 mg Oral Daily  . enoxaparin (LOVENOX) injection  40 mg Subcutaneous Q24H  . furosemide  40 mg Oral Daily  . insulin aspart  0-20 Units Subcutaneous TID WC  . insulin aspart  0-5 Units Subcutaneous QHS  . insulin aspart  5 Units Subcutaneous TID WC  . insulin glargine  15 Units Subcutaneous Daily  . ipratropium  0.5 mg Nebulization Q4H  . levalbuterol  1.25 mg Nebulization Q4H  . loratadine  10 mg Oral Daily  . losartan  100 mg Oral Daily  . magic mouthwash w/lidocaine  5 mL Oral QID  . multivitamin with minerals  1 tablet Oral Daily  . omeprazole  40 mg Oral QAC supper  . piperacillin-tazobactam (ZOSYN)  IV  3.375 g Intravenous Q8H  . [START ON 11/16/2013] predniSONE  60 mg Oral Q breakfast  . simvastatin  20 mg Oral q1800  . vancomycin  1,000 mg Intravenous Q12H   Continuous Infusions:    Active Problems:   COPD (chronic obstructive pulmonary disease)   Diabetes mellitus   Hypertension   HCAP (healthcare-associated pneumonia)   Sepsis   Acute respiratory failure with hypoxia   COPD with acute exacerbation    Time spent: 30 min    Gwendlyn Hanback, [facility] Pager [phone]. If 7PM-7AM, please contact night-coverage at [email or web address], password [facility] 11/15/2013, 12:48 PM  LOS: 3 days

## 2013-11-15 NOTE — Progress Notes (Signed)
  Echocardiogram 2D Echocardiogram has been performed.  Talmage Teaster FRANCES 11/15/2013, 3:45 PM

## 2013-11-15 NOTE — Progress Notes (Signed)
Clinical Social Work Department BRIEF PSYCHOSOCIAL ASSESSMENT 11/15/2013  Patient:  [name], [name]     Account Number:  [id number]     Ord date:  11/12/2013  Clinical Social Worker:  Earlie Server  Date/Time:  11/15/2013 02:30 PM  Referred by:  Physician  Date Referred:  11/15/2013 Referred for  SNF Placement   Other Referral:   Interview type:  Patient Other interview type:    PSYCHOSOCIAL DATA Living Status:  FACILITY Admitted from facility:  [facility] Level of care:  Independent Living Primary support name:  [name] Primary support relationship to patient:  CHILD, ADULT Degree of support available:   Strong    CURRENT CONCERNS Current Concerns  Post-Acute Placement   Other Concerns:    SOCIAL WORK ASSESSMENT / PLAN CSW received referral due to patient being admitted from a facility. CSW reviewed chart and met with patient at bedside. CSW introduced myself and explained role.    Patient reports she has been living at [facility] for the past 20 years. Patient has been in SNF, ALF, and independent living but is currently living in independent living. Patient reports that she is feeling weak and that she might need SNF when she returns to facility. CSW explained process and patient agreeable for CSW to contact son as well. CSW spoke with son as well who reports patient knows her limitations and he will allow her to make the decision on what she needs at [place] spoke with MD who is agreeable to order PT/OT to evaluate patient. Facility is holding a bed in SNF in case rehab is needed at [place]. CSW will continue to follow and will submit for insurance authorization once PT/OT is completed.   Assessment/plan status:  Psychosocial Support/Ongoing Assessment of Needs Other assessment/ plan:   Information/referral to community resources:   Will return to [facility]    PATIENT'S/FAMILY'S RESPONSE TO PLAN OF CARE: Patient alert and oriented. Patient  reports strong family support but knows that she is weaker and staying in the hospital has caused her to become deconditioned. Patient wants to work with PT/OT prior to making any decisions but is agreeable to SNF if needed. Patient agreeable to family involvement and son reports that he wants patient to do whatever she feels most comfortable doing. Son reports he will call CSW with any further questions.       [place], [place] [phone]

## 2013-11-15 NOTE — Care Management Note (Signed)
    Page 1 of 1   11/15/2013     2:36:51 PM CARE MANAGEMENT NOTE 11/15/2013  Patient:  Carla, [name]   Account Number:  [id number]  Date Initiated:  11/15/2013  Documentation initiated by:  Dessa Phi  Subjective/Objective Assessment:   61 Y/Y F ADMITTED W/PNA.     Action/Plan:   FROM [facility].   Anticipated DC Date:  11/18/2013   Anticipated DC Plan:  [place]  CM consult      Choice offered to / List presented to:             Status of service:  In process, will continue to follow Medicare Important Message given?  YES (If response is "NO", the following Medicare IM given date fields will be blank) Date Medicare IM given:  11/15/2013 Medicare IM given by:  [facility] Date Additional Medicare IM given:   Additional Medicare IM given by:    Discharge Disposition:    Per UR Regulation:  Reviewed for med. necessity/level of care/duration of stay  If discussed at [place] of Stay Meetings, dates discussed:    Comments:  11/15/13 Delmer Kowalski RN,BSN NCM [phone] AWAIT PT/OT RECOMMENDATIONS.

## 2013-11-16 DIAGNOSIS — A419 Sepsis, unspecified organism: Principal | ICD-10-CM

## 2013-11-16 LAB — BASIC METABOLIC PANEL
Anion gap: 12 (ref 5–15)
BUN: 21 mg/dL (ref 6–23)
CALCIUM: 8.3 mg/dL — AB (ref 8.4–10.5)
CHLORIDE: 100 meq/L (ref 96–112)
CO2: 29 mEq/L (ref 19–32)
CREATININE: 0.67 mg/dL (ref 0.50–1.10)
GFR calc non Af Amer: 73 mL/min — ABNORMAL LOW (ref >90)
GFR, EST AFRICAN AMERICAN: 85 mL/min — AB (ref >90)
Glucose, Bld: 161 mg/dL — ABNORMAL HIGH (ref 70–99)
Potassium: 3.3 mEq/L — ABNORMAL LOW (ref 3.7–5.3)
Sodium: 141 mEq/L (ref 137–147)

## 2013-11-16 LAB — CBC
HCT: 35.2 % — ABNORMAL LOW (ref 36.0–46.0)
Hemoglobin: 11.5 g/dL — ABNORMAL LOW (ref 12.0–15.0)
MCH: 30.9 pg (ref 26.0–34.0)
MCHC: 32.7 g/dL (ref 30.0–36.0)
MCV: 94.6 fL (ref 78.0–100.0)
PLATELETS: 207 10*3/uL (ref 150–400)
RBC: 3.72 MIL/uL — ABNORMAL LOW (ref 3.87–5.11)
RDW: 13.9 % (ref 11.5–15.5)
WBC: 14.1 10*3/uL — ABNORMAL HIGH (ref 4.0–10.5)

## 2013-11-16 LAB — GLUCOSE, CAPILLARY
GLUCOSE-CAPILLARY: 138 mg/dL — AB (ref 70–99)
Glucose-Capillary: 148 mg/dL — ABNORMAL HIGH (ref 70–99)
Glucose-Capillary: 154 mg/dL — ABNORMAL HIGH (ref 70–99)
Glucose-Capillary: 132 mg/dL — ABNORMAL HIGH (ref 70–99)

## 2013-11-16 LAB — CULTURE, BLOOD (ROUTINE X 2)

## 2013-11-16 MED ORDER — LEVALBUTEROL HCL 0.63 MG/3ML IN NEBU: 0.6300 mg | INHALATION_SOLUTION | Freq: Three times a day (TID) | RESPIRATORY_TRACT | Status: DC | PRN

## 2013-11-16 MED ORDER — INFLUENZA VAC SPLIT QUAD 0.5 ML IM SUSY
0.5000 mL | PREFILLED_SYRINGE | INTRAMUSCULAR | Status: AC
  Administered 2013-11-17: 0.5 mL via INTRAMUSCULAR
  Filled 2013-11-16 (×2): qty 0.5

## 2013-11-16 MED ORDER — LEVALBUTEROL HCL 1.25 MG/0.5ML IN NEBU
1.2500 mg | INHALATION_SOLUTION | Freq: Four times a day (QID) | RESPIRATORY_TRACT | Status: DC
  Administered 2013-11-17: 1.25 mg via RESPIRATORY_TRACT
  Filled 2013-11-16 (×5): qty 0.5

## 2013-11-16 MED ORDER — IPRATROPIUM BROMIDE 0.02 % IN SOLN
0.5000 mg | Freq: Four times a day (QID) | RESPIRATORY_TRACT | Status: DC
  Administered 2013-11-17: 0.5 mg via RESPIRATORY_TRACT
  Filled 2013-11-16: qty 2.5

## 2013-11-16 NOTE — Progress Notes (Signed)
Clinical Social Work  CSW spoke with [facility] who is aware of skilled needs at [place] and will have available beds. CSW faxed clinicals to insurance company for authorization. CSW received a call from son Mortimer Fries) so CSW updated son that CSW has submitted for insurance authorization and is keeping SNF updated on [place] plans. CSW will need to fax PT evaluation to insurance as well once completed.  [place], [place] [phone]

## 2013-11-16 NOTE — Progress Notes (Signed)
   SATURATION QUALIFICATIONS: (This note is used to comply with regulatory documentation for home oxygen)  Patient Saturations on Room Air at Rest = 91-92%  Patient Saturations on Room Air while Ambulating = 88%  Patient Saturations on 2 Liters of oxygen while Ambulating = 94%  [facility] 11/16/2013, 4:29 PM   Please see latest therapy progress note for current level of functioning and progress toward goals.    Kenyon Ana, PT Pager: [phone] 11/16/2013

## 2013-11-16 NOTE — Progress Notes (Signed)
PROGRESS NOTE  LURENE [name] TGG:[id number] DOB: March 28, 1917 DOA: 11/12/2013 PCP: Estill Dooms, MD  Assessment/Plan: Sepsis and septicemia (fever, WBC, tachycardia, tachypnea) due to HCAP and UTI. Temperature and WBC trending down.  - 1/2 Blood cultures growing GPC in clusters AND GNR  - Urine culture: E. Coli. pansensitive  - Legionella, S. pneumo urine ag both negative  - Sputum culture: moderate yeast (has thrush)  - Resp viral panel neg  - Continue Vancomycin and zosyn day 4---> 11/15/13 switched to Augmentin x 3 more days - mucolytics  Coag neg staph and GNR Bacteremia - amended report shows there was no GNR - Repeat blood cultures: NGTD  - F/u speciation: Coag neg staph and GNR  - ID consult given two bacteria from same blood culture. Suspect coag neg staph may be contaminate  Acute hypoxic resp failure secondary to HCAP and acute COPD exacerbation,  -wheezing improving - Repeat CXR: Persistent left mid-lung consolidation, diffuse bilateral opacities c/w interstitial pneumonitis or edema  - D/c solumedrol and start prednisone  - Duonebs q4h  - tx for pneumonia as above  - Flutter valve  - Wean oxygen as tolerated  - Continue lasix  - ECHO and BNP  Pleuritic chest pain likely related to pneumonia, resolved  Sinus tachycardia likely secondary to sepsis, resolved  - Telemetry: MAT, sinus tach, now NSR with occasional PVC and NSVT  - troponins neg  HTN/HLD, blood pressure labile and high.  - Taper steroids  - Continue norvasc 10 mg daily, bisoprolol 31m daily, ARB, and doxazosin  - On lasix  - Add hydralazine 265mpo TID  - Continue prn metoprolol  - Continue statin  DM, hyperglycemia likely due to steroids, A1c 7.2  - Hold oral meds  - SSI  - continue lantus 15 units  - continue aspart 5 units with meals  - Tapering steroids so monitor for decline in CBG  Leukocytosis due to multiple infections and steroids, trending down  - Trend WBC  Normocytic  anemia, likely secondary to marrow suppressions from acute illness or side effect of zosyn, stable around 10.73m67ml  Hyponatremia, mild and asymptomatic and may be secondary to SIADH from pneumonia  - Trend sodium  - No fluid restrictions at this time  GERD w/ hx of bleeding ulcers, stable. Continue omeprazole  Generalized weakness. PT/OT consulted  Diet: Diabetic  Access: PIV  IVF: off  Proph: lovenox  Code Status: DNR/DNI but okay with bipap  Family Communication: patient, her son and daughter-in-law            Procedures/Studies: Dg Chest Port 1 View  11/14/2013   CLINICAL DATA:  Productive cough, shortness of breath, left-sided pleuritic chest pain, and fever. Sepsis.  EXAM: PORTABLE CHEST - 1 VIEW  COMPARISON:  11/12/2013  FINDINGS: Cardiac silhouette remains upper limits of normal in size, unchanged. Confluent opacity is again seen in the lateral left mid lung, similar to the prior study. Diffuse bilateral interstitial opacities have increased, greatest in the lung bases. Small bilateral pleural effusions are not excluded. No pneumothorax is identified. Degenerative changes are again seen of the left glenohumeral joint.  IMPRESSION: Persistent left mid lung consolidation, consistent with pneumonia. Increasing, diffuse bilateral interstitial opacities could reflect interstitial pneumonitis or edema.   Electronically Signed   By: AllLogan BoresOn: 11/14/2013 08:29   Dg Chest Port 1 View  (if Code Sepsis Called)  11/12/2013   CLINICAL DATA:  One day history of chest pain.  Cough  EXAM: PORTABLE CHEST - 1 VIEW  COMPARISON:  February 27, 2013  FINDINGS: There is focal airspace consolidation in the periphery of the left upper lobe. There is underlying emphysema. Elsewhere the lungs appear clear. Heart is upper normal in size with pulmonary vascularity within normal limits. No adenopathy. There is arthropathy in the thoracic spine in both shoulders.  IMPRESSION: Left upper lobe airspace  consolidation. This opacity appears somewhat nodular. While it most likely represents pneumonia, followup images to clearing advised to exclude the possibility of underlying neoplasm in this area.   Electronically Signed   By: Lowella Grip M.D.   On: 11/12/2013 09:03         Subjective: Patient is breathing better patient still has some mild dyspnea on exertion. Denies any fevers, chills, chest pain, shortness breath, nausea, vomiting, diarrhea, abdominal pain, dysuria.  Objective: Filed Vitals:   11/16/13 0528 11/16/13 0802 11/16/13 1050 11/16/13 1425  BP: 91/63   135/80  Pulse: 73  93 68  Temp: 97.7 F (36.5 C)   98.4 F (36.9 C)  TempSrc: Oral   Oral  Resp: 18   18  Height:      Weight:      SpO2: 99% 96% 84% 95%    Intake/Output Summary (Last 24 hours) at 11/16/13 1601 Last data filed at 11/16/13 1247  Gross per 24 hour  Intake    240 ml  Output    702 ml  Net   -462 ml   Weight change:  Exam:   General:  Pt is alert, follows commands appropriately, not in acute distress  HEENT: No icterus, No thrush,  [place]/AT  Cardiovascular: RRR, S1/S2, no rubs, no gallops  Respiratory: L>R basilar crackles. No wheezing. Good air movement.  Abdomen: Soft/+BS, non tender, non distended, no guarding  Extremities: trace LE edema, No lymphangitis, No petechiae, No rashes, no synovitis  Data Reviewed: Basic Metabolic Panel:  Recent Labs Lab 11/12/13 0840 11/13/13 0156 11/14/13 0555 11/15/13 0458 11/16/13 0432  NA 136* 135* 133* 138 141  K 3.9 3.8 3.9 3.5* 3.3*  CL 96 100 97 100 100  CO2 _0 GLUCOSE 203* 290* 282* 249* 161*  BUN _1 CREATININE 0.55 0.56 0.69 0.62 0.67  CALCIUM 8.9 8.0* 8.7 8.3* 8.3*   Liver Function Tests:  Recent Labs Lab 11/12/13 0840  AST 15  ALT 11  ALKPHOS 85  BILITOT 0.5  PROT 6.8  ALBUMIN 3.5   No results found for this basename: LIPASE, AMYLASE,  in the last 168 hours No results found for this  basename: AMMONIA,  in the last 168 hours CBC:  Recent Labs Lab 11/12/13 0840 11/13/13 0156 11/14/13 0555 11/15/13 0458 11/16/13 0432  WBC 22.1* 19.0* 17.8* 14.2* 14.1*  NEUTROABS 20.1*  --   --   --   --   HGB 12.1 10.6* 10.9* 10.8* 11.5*  HCT 37.0 31.4* 32.4* 32.7* 35.2*  MCV 95.4 93.2 92.8 92.6 94.6  PLT 179 168 185 208 207   Cardiac Enzymes:  Recent Labs Lab 11/12/13 0840 11/12/13 1345 11/12/13 1930 11/13/13 0156  TROPONINI <0.30 <0.30 <0.30 <0.30   BNP: No components found with this basename: POCBNP,  CBG:  Recent Labs Lab 11/15/13 1204 11/15/13 1708 11/15/13 2147 11/16/13 0732 11/16/13 1140  GLUCAP 307* 176* 168* 148* 138*    Recent Results (from the past 240 hour(s))  CULTURE, BLOOD (  ROUTINE X 2)     Status: None   Collection Time    11/12/13  8:40 AM      Result Value Ref Range Status   Specimen Description BLOOD RIGHT FOREARM   Final   Special Requests BOTTLES DRAWN AEROBIC AND ANAEROBIC [facility] EACH   Final   Culture  Setup Time     Final   Value: 11/12/2013 16:02     Performed at [facility]   Culture     Final   Value: STAPHYLOCOCCUS SPECIES (COAGULASE NEGATIVE)     Note: THE SIGNIFICANCE OF ISOLATING THIS ORGANISM FROM A SINGLE SET OF BLOOD CULTURES WHEN MULTIPLE SETS ARE DRAWN IS UNCERTAIN. PLEASE NOTIFY THE MICROBIOLOGY DEPARTMENT WITHIN ONE WEEK IF SPECIATION AND SENSITIVITIES ARE REQUIRED.     Note: Gram Stain Report Called to,Read Back By and Verified With: KIM CHEEK 11/13/13 @ 2:26PM BY RUSCOE A. PREVIOUSLY REPORTED AS GRAM NEGATIVE RODS SEEN ALSO NO GRAM NEGATIVE RODS PRESENT CORRECTED RESULTS CALLED TO: GWEN HARRISON 11/16/13 1030 BY      SMITHERSJ     Performed at [facility]   Report Status 11/16/2013 FINAL   Final  URINE CULTURE     Status: None   Collection Time    11/12/13  9:04 AM      Result Value Ref Range Status   Specimen Description URINE, CATHETERIZED   Final   Special Requests NONE   Final   Culture  Setup  Time     Final   Value: 11/12/2013 16:26     Performed at [place]     Final   Value: >=100,000 COLONIES/ML     Performed at [facility]   Culture     Final   Value: ESCHERICHIA COLI     Performed at [facility]   Report Status 11/15/2013 FINAL   Final   Organism ID, Bacteria ESCHERICHIA COLI   Final  CULTURE, BLOOD (ROUTINE X 2)     Status: None   Collection Time    11/12/13  9:15 AM      Result Value Ref Range Status   Specimen Description BLOOD RIGHT ANTECUBITAL   Final   Special Requests BOTTLES DRAWN AEROBIC AND ANAEROBIC 3CC EACH   Final   Culture  Setup Time     Final   Value: 11/12/2013 16:02     Performed at [facility]   Culture     Final   Value:        BLOOD CULTURE RECEIVED NO GROWTH TO DATE CULTURE WILL BE HELD FOR 5 DAYS BEFORE ISSUING A FINAL NEGATIVE REPORT     Performed at [facility]   Report Status PENDING   Incomplete  RESPIRATORY VIRUS PANEL     Status: None   Collection Time    11/12/13  1:06 PM      Result Value Ref Range Status   Source - RVPAN NOSE   Final   Respiratory Syncytial Virus A NOT DETECTED   Final   Respiratory Syncytial Virus B NOT DETECTED   Final   Influenza A NOT DETECTED   Final   Influenza B NOT DETECTED   Final   Parainfluenza 1 NOT DETECTED   Final   Parainfluenza 2 NOT DETECTED   Final   Parainfluenza 3 NOT DETECTED   Final   Metapneumovirus NOT DETECTED   Final   Rhinovirus NOT DETECTED   Final  Adenovirus NOT DETECTED   Final   Influenza A H1 NOT DETECTED   Final   Influenza A H3 NOT DETECTED   Final   Comment: (NOTE)           Normal Reference Range for each Analyte: NOT DETECTED     Testing performed using the Luminex xTAG Respiratory Viral Panel test     kit.     The analytical performance characteristics of this assay have been     determined by [facility].  The modifications have not been     cleared or approved by the FDA. This assay has been  validated pursuant     to the CLIA regulations and is used for clinical purposes.     Performed at [facility] PCR SCREENING     Status: None   Collection Time    11/12/13  1:09 PM      Result Value Ref Range Status   MRSA by PCR NEGATIVE  NEGATIVE Final   Comment:            The GeneXpert MRSA Assay (FDA     approved for NASAL specimens     only), is one component of a     comprehensive MRSA colonization     surveillance program. It is not     intended to diagnose MRSA     infection nor to guide or     monitor treatment for     MRSA infections.  CULTURE, EXPECTORATED SPUTUM-ASSESSMENT     Status: None   Collection Time    11/13/13  7:30 AM      Result Value Ref Range Status   Specimen Description SPUTUM   Final   Special Requests NONE   Final   Sputum evaluation     Final   Value: THIS SPECIMEN IS ACCEPTABLE. RESPIRATORY CULTURE REPORT TO FOLLOW.   Report Status 11/13/2013 FINAL   Final  CULTURE, RESPIRATORY (NON-EXPECTORATED)     Status: None   Collection Time    11/13/13  7:30 AM      Result Value Ref Range Status   Specimen Description SPUTUM   Final   Special Requests NONE   Final   Gram Stain     Final   Value: MODERATE WBC PRESENT, PREDOMINANTLY PMN     ABUNDANT SQUAMOUS EPITHELIAL CELLS PRESENT     FEW GRAM NEGATIVE RODS     MODERATE GRAM POSITIVE COCCI IN PAIRS     RARE GRAM POSITIVE RODS   Culture     Final   Value: MODERATE YEAST CONSISTENT WITH CANDIDA SPECIES     Performed at [facility]   Report Status 11/15/2013 FINAL   Final  CULTURE, BLOOD (SINGLE)     Status: None   Collection Time    11/14/13  5:55 AM      Result Value Ref Range Status   Specimen Description BLOOD RIGHT ARM   Final   Special Requests BOTTLES DRAWN AEROBIC AND ANAEROBIC 5CC   Final   Culture  Setup Time     Final   Value: 11/14/2013 10:22     Performed at [facility]   Culture     Final   Value:        BLOOD CULTURE RECEIVED NO GROWTH TO DATE  CULTURE WILL BE HELD FOR 5 DAYS BEFORE ISSUING A FINAL NEGATIVE REPORT     Performed at [facility]   Report Status  PENDING   Incomplete     Scheduled Meds: . amLODipine  10 mg Oral Daily  . amoxicillin-clavulanate  1 tablet Oral BID WC  . bisoprolol  10 mg Oral Daily  . chlorhexidine  15 mL Mouth Rinse BID  . doxazosin  8 mg Oral Daily  . enoxaparin (LOVENOX) injection  40 mg Subcutaneous Q24H  . furosemide  40 mg Oral Daily  . hydrALAZINE  25 mg Oral TID  . insulin aspart  0-20 Units Subcutaneous TID WC  . insulin aspart  0-5 Units Subcutaneous QHS  . insulin aspart  5 Units Subcutaneous TID WC  . insulin glargine  15 Units Subcutaneous Daily  . ipratropium  0.5 mg Nebulization Q6H  . levalbuterol  1.25 mg Nebulization Q6H  . loratadine  10 mg Oral Daily  . losartan  100 mg Oral Daily  . magic mouthwash w/lidocaine  5 mL Oral QID  . multivitamin with minerals  1 tablet Oral Daily  . omeprazole  40 mg Oral QAC supper  . predniSONE  60 mg Oral Q breakfast  . simvastatin  20 mg Oral q1800   Continuous Infusions:    Eladia Frame, DO  [facility] Pager [phone]  If 7PM-7AM, please contact night-coverage [email or web address] Password [email or web address] 11/16/2013, 4:01 PM   LOS: 4 days

## 2013-11-16 NOTE — Evaluation (Signed)
Occupational Therapy Evaluation Patient Details Name: [name] MRN: [id number] DOB: 1917/07/20 Today's Date: 11/16/2013    History of Present Illness The patient is a 80 y.o. year-old female with history of COPD with exertional hypoxia at baseline to the mid-80s on RA, not on oxygen because she rarely exerts herself per chart, and followed by Dr. Gwenette Greet, DM, CAD, GERD with hx of bleeding ulcer, arthritis, hearing impairment who presents with cough, SOB, and urinary frequency.   Pt admitted with fatigue and chest pain.    Clinical Impression   Pt up to bathroom and practiced on and off the toilet and standing to groom at the sink. Pt states she feels weaker than her usual and is concerned with return to independent apartment alone. She currently requires min guard to min assist with ADL and feel she will benefit from rehab at [facility] before return to her independent apartment. Her sats drop to 84 to 87% with activity on RA and increase to 93% with rest and PLB. Reapplied O2 at end of sesion.     Follow Up Recommendations  SNF;Other (comment);Supervision/Assistance - 24 hour (rehab at [facility])    Equipment Recommendations  3 in 1 bedside comode    Recommendations for Other Services       Precautions / Restrictions Precautions Precautions: Fall Precaution Comments: monitor O2 sats      Mobility Bed Mobility               General bed mobility comments: in chair  Transfers Overall transfer level: Needs assistance Equipment used: Rolling walker (2 wheeled) Transfers: Sit to/from Stand Sit to Stand: Min assist         General transfer comment: 2 attempts to stand but min guard to stand from recliner; min assist from toilet with grab bar.     Balance                                            ADL Overall ADL's : Needs assistance/impaired Eating/Feeding: Independent;Sitting   Grooming: Wash/dry hands;Minimal assistance;Standing    Upper Body Bathing: Set up;Sitting   Lower Body Bathing: Minimal assistance;Sit to/from stand   Upper Body Dressing : Set up;Sitting   Lower Body Dressing: Minimal assistance;Sit to/from stand   Toilet Transfer: Minimal assistance;Ambulation;RW;Comfort height toilet;Grab bars   Toileting- Clothing Manipulation and Hygiene: Minimal assistance;Sit to/from stand         General ADL Comments: Pt became unsteady with turning in the bathroom to transfer away from toilet and needed min assist to regain balance. She desats with activity to 84-87% with toileting task but able to recover to 93% with rest and PLB. Reapplied O2 at end of session. Pt states she required frequent rest braeks at home with activity and she "didnt do much" at home. Pt able to don/doff socks but with effort. She usually wears Depends.      Vision                     Perception     Praxis      Pertinent Vitals/Pain Pain Assessment: No/denies pain     Hand Dominance     Extremity/Trunk Assessment Upper Extremity Assessment Upper Extremity Assessment: LUE deficits/detail LUE Deficits / Details: grossly only 50 degrees shoulder flexion due to pt reports of rotator cuff issues premorbid. elbow distal grossly WFL.  Communication     Cognition Arousal/Alertness: Awake/alert Behavior During Therapy: WFL for tasks assessed/performed Overall Cognitive Status: Within Functional Limits for tasks assessed                     General Comments       Exercises       Shoulder Instructions      Home Living Family/patient expects to be discharged to:: Other (Comment) (pt desires rehab at [facility]) Living Arrangements: Alone                                      Prior Functioning/Environment Level of Independence: Needs assistance  Gait / Transfers Assistance Needed: uses 3 wheel rollator ADL's / Homemaking Assistance Needed: has assistance for some meals and  housekeeping. modified independent with ADL.        OT Diagnosis: Generalized weakness   OT Problem List: Decreased strength;Decreased knowledge of use of DME or AE;Decreased activity tolerance   OT Treatment/Interventions: Self-care/ADL training;Patient/family education;Therapeutic activities;DME and/or AE instruction    OT Goals(Current goals can be found in the care plan section) Acute Rehab OT Goals Patient Stated Goal: return to independence OT Goal Formulation: With patient Time For Goal Achievement: 11/30/13 Potential to Achieve Goals: Good  OT Frequency: Min 2X/week   Barriers to D/C:            Co-evaluation              End of Session Equipment Utilized During Treatment: Gait belt;Rolling walker  Activity Tolerance: Patient tolerated treatment well Patient left: in chair;with call bell/phone within reach;with family/visitor present   Time: 1039-1110 OT Time Calculation (min): 31 min Charges:  OT General Charges $OT Visit: 1 Procedure OT Evaluation $Initial OT Evaluation Tier I: 1 Procedure OT Treatments $Self Care/Home Management : 8-22 mins $Therapeutic Activity: 8-22 mins G-Codes:    Jules Schick [phone] 11/16/2013, 12:19 PM

## 2013-11-16 NOTE — Progress Notes (Addendum)
Inpatient Diabetes Program Recommendations  AACE/ADA: New Consensus Statement on Inpatient Glycemic Control (2013)  Target Ranges:  Prepandial:   less than 140 mg/dL      Peak postprandial:   less than 180 mg/dL (1-2 hours)      Critically ill patients:  140 - 180 mg/dL     Results for ALEIGHYA, MCANELLY (MRN [id number]) as of 11/16/2013 10:38  Ref. Range 11/15/2013 07:43 11/15/2013 12:04 11/15/2013 17:08 11/15/2013 21:47  Glucose-Capillary Latest Range: 70-99 mg/dL 250 (H) 307 (H) 176 (H) 168 (H)    Results for BEZA, STEPPE (MRN [id number]) as of 11/16/2013 10:38  Ref. Range 11/16/2013 07:32  Glucose-Capillary Latest Range: 70-99 mg/dL 148 (H)     Current Insulin Orders:  Lantus 15 units daily in the AM        Novolog Resistant SSI tid ac + HS        Novolog 5 units tid with meals    MD- Note that patient received last dose of IV Solumedrol yesterday (10/13) at 5am.  Patient now getting Prednisone 60 mg daily.   Please consider decreasing Novolog SSI to Moderate scale for now (currently ordered as Resistant scale).     Will follow Wyn Quaker RN, MSN, CDE Diabetes Coordinator Inpatient Diabetes Program Team Pager: [phone] (8a-10p)

## 2013-11-16 NOTE — Evaluation (Signed)
Physical Therapy Evaluation Patient Details Name: [name] MRN: [id number] DOB: 03/08/1917 Today's Date: 11/16/2013   History of Present Illness  The patient is a 80 y.o. Adm  11/12/13 with  with cough, SOB, and urinary frequency, fatigue and CP  PMHx:  DM, CAD, GERD with hx of bleeding ulcer, arthritis.     Clinical Impression  Pt currently with functional limitations due to the deficits listed below (see PT Problem List).  Pt will benefit from skilled PT to increase their independence and safety with mobility to allow discharge to the venue listed below.      Follow Up Recommendations SNF (at [facility])    Equipment Recommendations  None recommended by PT    Recommendations for Other Services       Precautions / Restrictions Precautions Precautions: Fall Precaution Comments: monitor O2 sats      Mobility  Bed Mobility               General bed mobility comments: in chair  Transfers Overall transfer level: Needs assistance Equipment used: Rolling walker (2 wheeled) Transfers: Sit to/from Stand Sit to Stand: Min assist         General transfer comment: cues for hand placement and safety; 2 attempts to stand from recliner  Ambulation/Gait Ambulation/Gait assistance: Min guard;Min assist Ambulation Distance (Feet): 80 Feet Assistive device: Rolling walker (2 wheeled) Gait Pattern/deviations: Step-through pattern;Decreased stride length     General Gait Details: cues and occasional assist with walker maneuvering; cues  for pursed lip breathing  Stairs            Wheelchair Mobility    Modified Rankin (Stroke Patients Only)       Balance Overall balance assessment: Needs assistance         Standing balance support: No upper extremity supported;Bilateral upper extremity supported;During functional activity Standing balance-Leahy Scale: Fair                               Pertinent Vitals/Pain      Home Living Family/patient  expects to be discharged to:: Skilled nursing facility Living Arrangements: Alone               Additional Comments: Healthcare at [facility]; Has lived in [place] for 20years    Prior Function Level of Independence: Needs assistance   Gait / Transfers Assistance Needed: uses 3 wheel walker            Hand Dominance        Extremity/Trunk Assessment   Upper Extremity Assessment: Defer to OT evaluation           Lower Extremity Assessment: Generalized weakness         Communication   Communication: No difficulties  Cognition Arousal/Alertness: Awake/alert Behavior During Therapy: WFL for tasks assessed/performed Overall Cognitive Status: Within Functional Limits for tasks assessed                      General Comments      Exercises        Assessment/Plan    PT Assessment Patient needs continued PT services  PT Diagnosis Difficulty walking   PT Problem List Decreased activity tolerance;Decreased balance;Decreased mobility;Cardiopulmonary status limiting activity  PT Treatment Interventions DME instruction;Gait training;Therapeutic activities;Functional mobility training;Therapeutic exercise;Patient/family education   PT Goals (Current goals can be found in the Care Plan section) Acute Rehab PT Goals Patient Stated Goal: return  to independence PT Goal Formulation: With patient Time For Goal Achievement: 11/23/13 Potential to Achieve Goals: Good    Frequency Min 3X/week   Barriers to discharge        Co-evaluation               End of Session Equipment Utilized During Treatment: Gait belt Activity Tolerance: Patient tolerated treatment well Patient left: in chair;with call bell/phone within reach           Time: 1449-1508 PT Time Calculation (min): 19 min   Charges:   PT Evaluation $Initial PT Evaluation Tier I: 1 Procedure PT Treatments $Gait Training: 8-22 mins   PT G Codes:           Jaquita Bessire 11/16/2013, 4:17 PM

## 2013-11-16 NOTE — Progress Notes (Signed)
PT Cancellation Note  Patient Details Name: [name] MRN: [id number] DOB: 12/01/1917   Cancelled Treatment:    Reason Eval/Treat Not Completed: Fatigue/lethargy limiting ability to participate (pt finishing with OT , too fatigued, will attempt again as schedule permits)   [facility] 11/16/2013, 11:09 AM

## 2013-11-17 DIAGNOSIS — A419 Sepsis, unspecified organism: Secondary | ICD-10-CM | POA: Diagnosis not present

## 2013-11-17 LAB — BASIC METABOLIC PANEL
Anion gap: 9 (ref 5–15)
BUN: 18 mg/dL (ref 6–23)
CALCIUM: 8.2 mg/dL — AB (ref 8.4–10.5)
CO2: 33 mEq/L — ABNORMAL HIGH (ref 19–32)
Chloride: 98 mEq/L (ref 96–112)
Creatinine, Ser: 0.59 mg/dL (ref 0.50–1.10)
GFR calc Af Amer: 88 mL/min — ABNORMAL LOW (ref >90)
GFR, EST NON AFRICAN AMERICAN: 76 mL/min — AB (ref >90)
Glucose, Bld: 126 mg/dL — ABNORMAL HIGH (ref 70–99)
POTASSIUM: 3.2 meq/L — AB (ref 3.7–5.3)
Sodium: 140 mEq/L (ref 137–147)

## 2013-11-17 LAB — CBC
HCT: 35.4 % — ABNORMAL LOW (ref 36.0–46.0)
Hemoglobin: 11.6 g/dL — ABNORMAL LOW (ref 12.0–15.0)
MCH: 30.9 pg (ref 26.0–34.0)
MCHC: 32.8 g/dL (ref 30.0–36.0)
MCV: 94.1 fL (ref 78.0–100.0)
PLATELETS: 222 10*3/uL (ref 150–400)
RBC: 3.76 MIL/uL — AB (ref 3.87–5.11)
RDW: 13.7 % (ref 11.5–15.5)
WBC: 13.3 10*3/uL — ABNORMAL HIGH (ref 4.0–10.5)

## 2013-11-17 LAB — GLUCOSE, CAPILLARY
GLUCOSE-CAPILLARY: 124 mg/dL — AB (ref 70–99)
Glucose-Capillary: 99 mg/dL (ref 70–99)

## 2013-11-17 MED ORDER — BISOPROLOL FUMARATE 10 MG PO TABS: 10.0000 mg | ORAL_TABLET | Freq: Every day | ORAL | Status: DC

## 2013-11-17 MED ORDER — PREDNISONE 10 MG PO TABS: 60.0000 mg | ORAL_TABLET | Freq: Every day | ORAL | Status: DC

## 2013-11-17 MED ORDER — LEVOFLOXACIN 750 MG PO TABS: 750.0000 mg | ORAL_TABLET | Freq: Every day | ORAL | Status: DC

## 2013-11-17 MED ORDER — LEVOFLOXACIN 500 MG PO TABS: 500.0000 mg | ORAL_TABLET | Freq: Every day | ORAL | Status: DC

## 2013-11-17 MED ORDER — HYDROCORTISONE 1 % EX CREA: TOPICAL_CREAM | Freq: Two times a day (BID) | CUTANEOUS | Status: DC

## 2013-11-17 MED ORDER — HYDROCORTISONE 1 % EX CREA
TOPICAL_CREAM | Freq: Two times a day (BID) | CUTANEOUS | Status: DC
  Administered 2013-11-17: 11:00:00 via TOPICAL
  Filled 2013-11-17: qty 28

## 2013-11-17 MED ORDER — LEVOFLOXACIN 500 MG PO TABS
500.0000 mg | ORAL_TABLET | Freq: Every day | ORAL | Status: DC
  Administered 2013-11-17: 500 mg via ORAL
  Filled 2013-11-17: qty 1

## 2013-11-17 MED ORDER — HYDRALAZINE HCL 25 MG PO TABS: 25.0000 mg | ORAL_TABLET | Freq: Three times a day (TID) | ORAL | Status: DC

## 2013-11-17 MED ORDER — POTASSIUM CHLORIDE CRYS ER 20 MEQ PO TBCR
40.0000 meq | EXTENDED_RELEASE_TABLET | Freq: Once | ORAL | Status: AC
  Administered 2013-11-17: 40 meq via ORAL
  Filled 2013-11-17: qty 2

## 2013-11-17 NOTE — Progress Notes (Signed)
Patient for discharge to SNF bed at [facility]  today. Plans confirmed with patient and family who are in agreement. SNF is prepared for patient and we will  plan transfer via PTAR. Authorization # received from [facility] medicare# [id number], RUG # [id number]. Pt and pt.'s granddaughter at bedside made aware.  Charlene Brooke, MSW  Social Worker [phone]

## 2013-11-17 NOTE — Progress Notes (Signed)
Discharge education completed by RN.Pt and daughter deny any questions at this time.No IV present at start of shift. Pt flu shot current for this season. Pt will discharge from the unit via PTAR to SNF.

## 2013-11-17 NOTE — Discharge Summary (Signed)
Physician Discharge Summary  [name] JJK:[id number] DOB: [birth year] DOA: 11/12/2013  PCP: Estill Dooms, MD  Admit date: 11/12/2013 Discharge date: 11/17/2013  Recommendations for Outpatient Follow-up:  1. Pt will need to follow up with PCP in 2 weeks post discharge 2. Please obtain BMP, CBC in one week 3. Maintain pt on 2L [place] to keep oxygen sat >92%.  Pt can be weaned off if ambulatory pulse ox remains <88%   Discharge Diagnoses:  Sepsis and septicemia (fever, WBC, tachycardia, tachypnea) due to HCAP and UTI. Temperature and WBC trending down.  - 1/2 Blood cultures growing GPC in clusters AND GNR  - Urine culture: E. Coli. pansensitive  - Legionella, S. pneumo urine ag both negative  - Sputum culture: moderate yeast (has thrush)  - Resp viral panel neg  - Continue Vancomycin and zosyn day 4---> 11/15/13 switched to Augmentin  - On 11/17/2013, the patient was noted to have a mild maculopapular rash on her right bicep area--there was concern this may been a reaction to penicillin -Augmentin was discontinued, and patient was started on levofloxacin 500 mg daily -Patient will be discharged with 2 additional days which will complete 7 days of therapy Coag neg staph and GNR Bacteremia  - amended report shows there was no GNR  - Repeat blood cultures: NGTD  - F/u speciation: Coag neg staph and GNR  - ID consult given two bacteria from same blood culture. Suspect coag neg staph may be contaminate  Acute hypoxic resp failure  -secondary to HCAP and acute COPD exacerbation,  -wheezing improving  - Repeat CXR: Persistent left mid-lung consolidation, diffuse bilateral opacities c/w interstitial pneumonitis or edema  - D/c solumedrol and started prednisone taper - Pt be discharged with prednisone 60 mg starting 11/18/2013, decrease by 10 mg daily - Duonebs q4h  - tx for pneumonia as above  - Flutter valve  - Wean oxygen as tolerated  - Continue lasix  - ECHO--EF 55-60%, grade  2 diastolic dysfunction -Patient had ambulatory pulse oximetry which revealed that she desaturated to 88% on room air--she will discharge with 2 L nasal cannula Pleuritic chest pain  likely related to pneumonia, resolved  Sinus tachycardia likely secondary to sepsis, resolved  - Telemetry: MAT, sinus tach, now NSR with occasional PVC and NSVT  - troponins neg  HTN/HLD - Taper steroids  - Continue norvasc 10 mg daily, bisoprolol 23m daily, ARB, and doxazosin  - On lasix 40 mg daily - Added hydralazine 263mpo TID  - Continue statin  DM,  -hyperglycemia likely due to steroids, A1c 7.2  - Hold oral meds--these will be resumed after discharge  - SSI  - continue lantus 15 units while the patient is in the hospital  - continue aspart 5 units with meals while the patient is in the hospital  Leukocytosis  -due to multiple infections and steroids, trending down  - Trend WBC  Normocytic anemia,  -likely secondary to marrow suppressions from acute illness or side effect of zosyn, stable around 10.60m36ml  Hyponatremia,  -mild and asymptomatic and may be secondary to SIADH from pneumonia  - Trend sodium-->improved.  140 on the day of discharge - No fluid restrictions at this time  GERD w/ hx of bleeding ulcers, stable. Continue omeprazole  Generalized weakness. PT/OT consulted--recommended skilled nursing facility  Diet: Diabetic  Access: PIV  IVF: off  Proph: lovenox  Code Status: DNR/DNI but okay with bipap    Discharge Condition: Stable  Disposition: [facility]  Diet:carb modified Wt Readings from Last 3 Encounters:  11/14/13 83.7 kg (184 lb 8.4 oz)  11/01/13 80.74 kg (178 lb)  09/13/13 79.833 kg (176 lb)    History of present illness:  80 y.o. year-old female with history of COPD with exertional hypoxia at baseline to the mid-80s on RA, not on oxygen because she rarely exerts herself and followed by Dr. Gwenette Greet, DM, CAD, GERD with hx of bleeding ulcer, arthritis, hearing  impairment who presents with cough, SOB, and urinary frequency. She lives at [place] and gets around with electric scooter or rolling walker. She presented with cough productive of clear phlegm, SOB, pleuritic 10/10 left sided chest pain, and urinary urgency/frequency. In the ER, she was febrile to 101.79F, HR 130s sinus tach, and was found to have LUL PNA and UTI. She was started on vanc and zosyn and admitted to stepdown.  10/10 Respiratory distress upon admission to stepdown requiring bipap. Also had episode of nonresponsiveness that self-resolved  10/12 Persistent respiratory distress  10/13 Urine culture speciated to E. Coli sens to amp. ID consulted, steroids finally able to be tapered the patient gradually improved. She remained hemodynamically stable and weaned off of BiPAP. She was transferred to the medical floor. She was continued on Solu-Medrol with clinical improvement. She was transitioned to oral prednisone. She remained clinically stable and was ready for discharge. The patient did have oxygen desaturation with ambulation. She'll be discharged on 2 L nasal cannula. The patient to ultimately be weaned off oxygen by her primary care provider. This can be followed up on an outpatient setting.       Consultants: ID--[place]  Discharge Exam: Filed Vitals:   11/17/13 0744  BP: 159/55  Pulse: 74  Temp: 98.4 F (36.9 C)  Resp: 21   Filed Vitals:   11/16/13 2143 11/17/13 0518 11/17/13 0739 11/17/13 0744  BP: 149/53 178/68  159/55  Pulse: 84 77  74  Temp: 97.7 F (36.5 C) 97.8 F (36.6 C)  98.4 F (36.9 C)  TempSrc: Oral Oral  Oral  Resp: _0 Height:      Weight:      SpO2: 94% 97% 96% 96%   General: A&O x 3, NAD, pleasant, cooperative Cardiovascular: RRR, no rub, no gallop, no S3 Respiratory: Basilar crackles, left greater than right. No wheezing.  Abdomen:soft, nontender, nondistended, positive bowel sounds Extremities: trace edema, No lymphangitis,  no petechiae  Discharge Instructions      Discharge Instructions   Diet - low sodium heart healthy    Complete by:  As directed      Increase activity slowly    Complete by:  As directed             Medication List         acetaminophen 325 MG tablet  Commonly known as:  TYLENOL  Take 650 mg by mouth every 6 (six) hours as needed for moderate pain (pain).     amLODipine 5 MG tablet  Commonly known as:  NORVASC  Take 10 mg by mouth daily.     bisoprolol 10 MG tablet  Commonly known as:  ZEBETA  Take 1 tablet (10 mg total) by mouth daily.     calcium carbonate 750 MG chewable tablet  Commonly known as:  TUMS EX  Chew 2 tablets by mouth 3 (three) times daily as needed for heartburn (after meals if needed for heartburn).     cetirizine 10 MG tablet  Commonly known as:  ZYRTEC  Take 10 mg by mouth daily.     cloNIDine 0.1 MG tablet  Commonly known as:  CATAPRES  Take 1 tablet for blood pressure greater than 160/100. May repeat in 1 hour if still greater than 160/100.     diclofenac sodium 1 % Gel  Commonly known as:  VOLTAREN  Apply 1 g topically 4 (four) times daily as needed (knees).     doxazosin 8 MG tablet  Commonly known as:  CARDURA  Take 8 mg by mouth daily. To control BP.     doxepin 10 MG capsule  Commonly known as:  SINEQUAN  Take 10 mg by mouth at bedtime as needed (for rest).     furosemide 40 MG tablet  Commonly known as:  LASIX  Take 40 mg by mouth daily. Take one daily     GAS RELIEF 80 PO  Take 1 tablet by mouth daily.     glipiZIDE 10 MG tablet  Commonly known as:  GLUCOTROL  Take 10 mg by mouth 2 (two) times daily.     glucose blood test strip  1 each by Other route daily. Use once daily to test blood sugar.     hydrALAZINE 25 MG tablet  Commonly known as:  APRESOLINE  Take 1 tablet (25 mg total) by mouth 3 (three) times daily.     hydrocortisone cream 1 %  Apply topically 2 (two) times daily. Apply to right upper arm/bicep area  bid x one week     levofloxacin 500 MG tablet  Commonly known as:  LEVAQUIN  Take 1 tablet (500 mg total) by mouth daily.  Start taking on:  11/18/2013     losartan 100 MG tablet  Commonly known as:  COZAAR  Take 100 mg by mouth daily. Take one tablet daily for blood pressure     metFORMIN 500 MG tablet  Commonly known as:  GLUCOPHAGE  Take 1,000 mg by mouth 2 (two) times daily with a meal.     multivitamin capsule  Take 1 capsule by mouth daily.     omeprazole 40 MG capsule  Commonly known as:  PRILOSEC  Take 40 mg by mouth daily.     predniSONE 10 MG tablet  Commonly known as:  DELTASONE  Take 6 tablets (60 mg total) by mouth daily with breakfast. On 11/18/13 and decrease by 59m daily  Start taking on:  11/18/2013     simvastatin 20 MG tablet  Commonly known as:  ZOCOR  Take 20 mg by mouth daily.         The results of significant diagnostics from this hospitalization (including imaging, microbiology, ancillary and laboratory) are listed below for reference.    Significant Diagnostic Studies: Dg Chest Port 1 View  11/14/2013   CLINICAL DATA:  Productive cough, shortness of breath, left-sided pleuritic chest pain, and fever. Sepsis.  EXAM: PORTABLE CHEST - 1 VIEW  COMPARISON:  11/12/2013  FINDINGS: Cardiac silhouette remains upper limits of normal in size, unchanged. Confluent opacity is again seen in the lateral left mid lung, similar to the prior study. Diffuse bilateral interstitial opacities have increased, greatest in the lung bases. Small bilateral pleural effusions are not excluded. No pneumothorax is identified. Degenerative changes are again seen of the left glenohumeral joint.  IMPRESSION: Persistent left mid lung consolidation, consistent with pneumonia. Increasing, diffuse bilateral interstitial opacities could reflect interstitial pneumonitis or edema.   Electronically Signed   By: ALogan Bores  On: 11/14/2013 08:29   Dg Chest Port 1 View  (if Code Sepsis  Called)  11/12/2013   CLINICAL DATA:  One day history of chest pain.  Cough  EXAM: PORTABLE CHEST - 1 VIEW  COMPARISON:  February 27, 2013  FINDINGS: There is focal airspace consolidation in the periphery of the left upper lobe. There is underlying emphysema. Elsewhere the lungs appear clear. Heart is upper normal in size with pulmonary vascularity within normal limits. No adenopathy. There is arthropathy in the thoracic spine in both shoulders.  IMPRESSION: Left upper lobe airspace consolidation. This opacity appears somewhat nodular. While it most likely represents pneumonia, followup images to clearing advised to exclude the possibility of underlying neoplasm in this area.   Electronically Signed   By: Lowella Grip M.D.   On: 11/12/2013 09:03     Microbiology: Recent Results (from the past 240 hour(s))  CULTURE, BLOOD (ROUTINE X 2)     Status: None   Collection Time    11/12/13  8:40 AM      Result Value Ref Range Status   Specimen Description BLOOD RIGHT FOREARM   Final   Special Requests BOTTLES DRAWN AEROBIC AND ANAEROBIC [facility] EACH   Final   Culture  Setup Time     Final   Value: 11/12/2013 16:02     Performed at [facility]   Culture     Final   Value: STAPHYLOCOCCUS SPECIES (COAGULASE NEGATIVE)     Note: THE SIGNIFICANCE OF ISOLATING THIS ORGANISM FROM A SINGLE SET OF BLOOD CULTURES WHEN MULTIPLE SETS ARE DRAWN IS UNCERTAIN. PLEASE NOTIFY THE MICROBIOLOGY DEPARTMENT WITHIN ONE WEEK IF SPECIATION AND SENSITIVITIES ARE REQUIRED.     Note: Gram Stain Report Called to,Read Back By and Verified With: KIM CHEEK 11/13/13 @ 2:26PM BY RUSCOE A. PREVIOUSLY REPORTED AS GRAM NEGATIVE RODS SEEN ALSO NO GRAM NEGATIVE RODS PRESENT CORRECTED RESULTS CALLED TO: GWEN HARRISON 11/16/13 1030 BY      SMITHERSJ     Performed at [facility]   Report Status 11/16/2013 FINAL   Final  URINE CULTURE     Status: None   Collection Time    11/12/13  9:04 AM      Result Value Ref Range  Status   Specimen Description URINE, CATHETERIZED   Final   Special Requests NONE   Final   Culture  Setup Time     Final   Value: 11/12/2013 16:26     Performed at [place]     Final   Value: >=100,000 COLONIES/ML     Performed at [facility]   Culture     Final   Value: ESCHERICHIA COLI     Performed at [facility]   Report Status 11/15/2013 FINAL   Final   Organism ID, Bacteria ESCHERICHIA COLI   Final  CULTURE, BLOOD (ROUTINE X 2)     Status: None   Collection Time    11/12/13  9:15 AM      Result Value Ref Range Status   Specimen Description BLOOD RIGHT ANTECUBITAL   Final   Special Requests BOTTLES DRAWN AEROBIC AND ANAEROBIC [facility] EACH   Final   Culture  Setup Time     Final   Value: 11/12/2013 16:02     Performed at [facility]   Culture     Final   Value:        BLOOD CULTURE RECEIVED NO GROWTH  TO DATE CULTURE WILL BE HELD FOR 5 DAYS BEFORE ISSUING A FINAL NEGATIVE REPORT     Performed at [facility]   Report Status PENDING   Incomplete  RESPIRATORY VIRUS PANEL     Status: None   Collection Time    11/12/13  1:06 PM      Result Value Ref Range Status   Source - RVPAN NOSE   Final   Respiratory Syncytial Virus A NOT DETECTED   Final   Respiratory Syncytial Virus B NOT DETECTED   Final   Influenza A NOT DETECTED   Final   Influenza B NOT DETECTED   Final   Parainfluenza 1 NOT DETECTED   Final   Parainfluenza 2 NOT DETECTED   Final   Parainfluenza 3 NOT DETECTED   Final   Metapneumovirus NOT DETECTED   Final   Rhinovirus NOT DETECTED   Final   Adenovirus NOT DETECTED   Final   Influenza A H1 NOT DETECTED   Final   Influenza A H3 NOT DETECTED   Final   Comment: (NOTE)           Normal Reference Range for each Analyte: NOT DETECTED     Testing performed using the Luminex xTAG Respiratory Viral Panel test     kit.     The analytical performance characteristics of this assay have been     determined by  [facility].  The modifications have not been     cleared or approved by the FDA. This assay has been validated pursuant     to the CLIA regulations and is used for clinical purposes.     Performed at [facility] PCR SCREENING     Status: None   Collection Time    11/12/13  1:09 PM      Result Value Ref Range Status   MRSA by PCR NEGATIVE  NEGATIVE Final   Comment:            The GeneXpert MRSA Assay (FDA     approved for NASAL specimens     only), is one component of a     comprehensive MRSA colonization     surveillance program. It is not     intended to diagnose MRSA     infection nor to guide or     monitor treatment for     MRSA infections.  CULTURE, EXPECTORATED SPUTUM-ASSESSMENT     Status: None   Collection Time    11/13/13  7:30 AM      Result Value Ref Range Status   Specimen Description SPUTUM   Final   Special Requests NONE   Final   Sputum evaluation     Final   Value: THIS SPECIMEN IS ACCEPTABLE. RESPIRATORY CULTURE REPORT TO FOLLOW.   Report Status 11/13/2013 FINAL   Final  CULTURE, RESPIRATORY (NON-EXPECTORATED)     Status: None   Collection Time    11/13/13  7:30 AM      Result Value Ref Range Status   Specimen Description SPUTUM   Final   Special Requests NONE   Final   Gram Stain     Final   Value: MODERATE WBC PRESENT, PREDOMINANTLY PMN     ABUNDANT SQUAMOUS EPITHELIAL CELLS PRESENT     FEW GRAM NEGATIVE RODS     MODERATE GRAM POSITIVE COCCI IN PAIRS     RARE GRAM POSITIVE RODS   Culture     Final  Value: MODERATE YEAST CONSISTENT WITH CANDIDA SPECIES     Performed at [facility]   Report Status 11/15/2013 FINAL   Final  CULTURE, BLOOD (SINGLE)     Status: None   Collection Time    11/14/13  5:55 AM      Result Value Ref Range Status   Specimen Description BLOOD RIGHT ARM   Final   Special Requests BOTTLES DRAWN AEROBIC AND ANAEROBIC 5CC   Final   Culture  Setup Time     Final   Value: 11/14/2013 10:22      Performed at [facility]   Culture     Final   Value:        BLOOD CULTURE RECEIVED NO GROWTH TO DATE CULTURE WILL BE HELD FOR 5 DAYS BEFORE ISSUING A FINAL NEGATIVE REPORT     Performed at [facility]   Report Status PENDING   Incomplete     Labs: Basic Metabolic Panel:  Recent Labs Lab 11/13/13 0156 11/14/13 0555 11/15/13 0458 11/16/13 0432 11/17/13 0439  NA 135* 133* 138 141 140  K 3.8 3.9 3.5* 3.3* 3.2*  CL 100 97 100 100 98  CO2 _0 33*  GLUCOSE 290* 282* 249* 161* 126*  BUN _1 CREATININE 0.56 0.69 0.62 0.67 0.59  CALCIUM 8.0* 8.7 8.3* 8.3* 8.2*   Liver Function Tests:  Recent Labs Lab 11/12/13 0840  AST 15  ALT 11  ALKPHOS 85  BILITOT 0.5  PROT 6.8  ALBUMIN 3.5   No results found for this basename: LIPASE, AMYLASE,  in the last 168 hours No results found for this basename: AMMONIA,  in the last 168 hours CBC:  Recent Labs Lab 11/12/13 0840 11/13/13 0156 11/14/13 0555 11/15/13 0458 11/16/13 0432 11/17/13 0439  WBC 22.1* 19.0* 17.8* 14.2* 14.1* 13.3*  NEUTROABS 20.1*  --   --   --   --   --   HGB 12.1 10.6* 10.9* 10.8* 11.5* 11.6*  HCT 37.0 31.4* 32.4* 32.7* 35.2* 35.4*  MCV 95.4 93.2 92.8 92.6 94.6 94.1  PLT 179 168 185 208 207 222   Cardiac Enzymes:  Recent Labs Lab 11/12/13 0840 11/12/13 1345 11/12/13 1930 11/13/13 0156  TROPONINI <0.30 <0.30 <0.30 <0.30   BNP: No components found with this basename: POCBNP,  CBG:  Recent Labs Lab 11/16/13 0732 11/16/13 1140 11/16/13 1633 11/16/13 2131 11/17/13 0745  GLUCAP 148* 138* 154* 132* 99    Time coordinating discharge:  Greater than 30 minutes  Signed:  Tywaun Hiltner, DO [facility] Pager: [phone] 11/17/2013, 8:04 AM

## 2013-11-18 ENCOUNTER — Non-Acute Institutional Stay (SKILLED_NURSING_FACILITY): Payer: Medicare Other | Admitting: Nurse Practitioner

## 2013-11-18 ENCOUNTER — Encounter: Payer: Self-pay | Admitting: Nurse Practitioner

## 2013-11-18 DIAGNOSIS — E871 Hypo-osmolality and hyponatremia: Secondary | ICD-10-CM

## 2013-11-18 DIAGNOSIS — D638 Anemia in other chronic diseases classified elsewhere: Secondary | ICD-10-CM

## 2013-11-18 DIAGNOSIS — I10 Essential (primary) hypertension: Secondary | ICD-10-CM

## 2013-11-18 DIAGNOSIS — A4150 Gram-negative sepsis, unspecified: Secondary | ICD-10-CM

## 2013-11-18 DIAGNOSIS — J9601 Acute respiratory failure with hypoxia: Secondary | ICD-10-CM

## 2013-11-18 DIAGNOSIS — D72829 Elevated white blood cell count, unspecified: Secondary | ICD-10-CM

## 2013-11-18 DIAGNOSIS — R0781 Pleurodynia: Secondary | ICD-10-CM

## 2013-11-18 DIAGNOSIS — G4700 Insomnia, unspecified: Secondary | ICD-10-CM

## 2013-11-18 DIAGNOSIS — K254 Chronic or unspecified gastric ulcer with hemorrhage: Secondary | ICD-10-CM

## 2013-11-18 LAB — CULTURE, BLOOD (ROUTINE X 2): Culture: NO GROWTH

## 2013-11-18 NOTE — Progress Notes (Signed)
Patient ID: [name], female   DOB: Jul 03, 1917, 80 y.o.   MRN: [id number]   Code Status: DNR  Allergies  Allergen Reactions  . Adhesive [Tape]     unknown  . Aspirin     unknown  . Codeine     unknown  . Enalapril Cough  . Hydrocodone     unknown  . Pantoprazole Sodium     unknown    Chief Complaint  Patient presents with  . Medical Management of Chronic Issues  . Hospitalization Follow-up    HPI: Patient is a 80 y.o. female seen in the SNF at [facility] today for evaluation of s/p hospitalizatoin and other chronic medical conditions.     Hospitalized 11/12/2013 -11/17/2013 for COPD exacerbation and septicemia due to Gram Neg bacteremia.   Problem List Items Addressed This Visit   Septicemia due to Gram negative organism     Coag neg staph and GNR Bacteremia  - amended report shows there was no GNR  - Repeat blood cultures: NGTD  - F/u speciation: Coag neg staph and GNR  - ID consult given two bacteria from same blood culture. Suspect coag neg staph may be contaminate (fever, WBC, tachycardia, tachypnea) due to HCAP and UTI. Temperature and WBC trending down.  - 1/2 Blood cultures growing GPC in clusters AND GNR  - Urine culture: E. Coli. pansensitive  - Legionella, S. pneumo urine ag both negative  - Sputum culture: moderate yeast (has thrush)  - Resp viral panel neg  - Continue Vancomycin and zosyn day 4---> 11/15/13 switched to Augmentin  - On 11/17/2013, the patient was noted to have a mild maculopapular rash on her right bicep area--there was concern this may been a reaction to penicillin  -Augmentin was discontinued, and patient was started on levofloxacin 500 mg daily  -Patient will complete 2 additional days which will complete 7 days of therapy @ SNF        Leukocytosis     - due to multiple infections and steroids, trending down  - Trend WBC      Insomnia     Prn Doxepin 10mg  hs available to her.     Hyponatremia     - mild and  asymptomatic and may be secondary to SIADH from pneumonia  - Trend sodium-->improved. 140 on the day of discharge  - No fluid restrictions at this time      Hypertension (Chronic)     - Taper steroids  - Continue norvasc 10 mg daily, bisoprolol 10mg  daily, ARB, doxazosin, and prn Clonidine 0.1mg  fro Bp>160/100 - On lasix 40 mg daily  - Added hydralazine 25mg  po TID  - Continue statin     Gastric ulcer with hemorrhage - Primary     w/ hx of bleeding ulcers, stable. Continue omeprazole and prn TUMs EX p meals prn.      Chest pain, pleuritic     - likely related to pneumonia, resolved  - Sinus tachycardia likely secondary to sepsis, resolved  - Telemetry: MAT, sinus tach, now NSR with occasional PVC and NSVT  - troponins neg      Anemia of chronic disease     likely secondary to marrow suppressions from acute illness or side effect of zosyn, stable around 10.8mg /dl      Acute respiratory failure with hypoxia     -secondary to HCAP and acute COPD exacerbation,  -wheezing resolved.  - Repeat CXR: Persistent left mid-lung consolidation, diffuse bilateral opacities c/w interstitial  pneumonitis or edema  - complete prednisone taper @ SNF with prednisone 60 mg starting 11/18/2013, decrease by 10 mg daily  - Duonebs q4h  - tx for pneumonia as above  - Flutter valve  - Wean oxygen as tolerated  - Continue lasix  - ECHO--EF 38-25%, grade 2 diastolic dysfunction  -Patient had ambulatory pulse oximetry which revealed that she desaturated to 88% on room air--she will discharge with 2 L nasal cannula        Review of Systems:  Review of Systems  Constitutional: Positive for malaise/fatigue. Negative for fever, chills, weight loss and diaphoresis.       Baseline mobility is motorized scooter  HENT: Positive for hearing loss. Negative for congestion, ear discharge, ear pain, nosebleeds, sore throat and tinnitus.   Eyes: Negative for blurred vision, double vision, photophobia and pain.    Respiratory: Positive for cough and shortness of breath. Negative for hemoptysis, sputum production, wheezing and stridor.   Cardiovascular: Positive for leg swelling. Negative for chest pain, palpitations, orthopnea, claudication and PND.       Trace  Gastrointestinal: Negative for heartburn, nausea, vomiting, abdominal pain, diarrhea, constipation, blood in stool and melena.  Genitourinary: Positive for frequency. Negative for dysuria, urgency, hematuria and flank pain.  Musculoskeletal: Negative for back pain, falls, joint pain, myalgias and neck pain.  Skin: Negative for itching and rash.  Neurological: Positive for weakness. Negative for dizziness, tingling, tremors, sensory change, speech change, focal weakness, seizures, loss of consciousness and headaches.  Endo/Heme/Allergies: Negative for environmental allergies and polydipsia. Does not bruise/bleed easily.  Psychiatric/Behavioral: Negative for depression, suicidal ideas, hallucinations, memory loss and substance abuse. The patient has insomnia. The patient is not nervous/anxious.    (type .ros)  Past Medical History  Diagnosis Date  . Type II or unspecified type diabetes mellitus without mention of complication, not stated as uncontrolled   . Hypertension   . Arthritis   . Ulcer   . COPD (chronic obstructive pulmonary disease)   . Regional enteritis of unspecified site   . Other malaise and fatigue   . Anemia, unspecified   . Other and unspecified hyperlipidemia   . Hypoxemia   . Pneumonia, organism unspecified   . Mucopurulent chronic bronchitis   . Palpitations   . Nontoxic uninodular goiter   . Abnormality of gait   . Fall from other slipping, tripping, or stumbling   . Cholelithiases   . Closed fracture of unspecified part of ramus of mandible   . Disturbance of skin sensation   . Sciatica   . Tension headache   . Coronary atherosclerosis of unspecified type of vessel, native or graft   . Osteoarthrosis,  unspecified whether generalized or localized, unspecified site   . Insomnia, unspecified   . Edema   . Tension headache   . Pain in joint, lower leg 2010    right knee  . Unspecified venous (peripheral) insufficiency   . Esophageal reflux   . Rosacea   . Cataract 1990    Dr. Katy Fitch  . Pain in joint, shoulder region 2013    left  . Gastric ulcer with hemorrhage 2008  . Diabetes mellitus type 2 in nonobese    Past Surgical History  Procedure Laterality Date  . Total knee arthroplasty Bilateral 1993, 1998    knees  . Other surgical history  2008    Ulcer surgery   . Small intestine surgery    . Eye surgery Bilateral 1990    Dr. Katy Fitch  .  Gastrojejunostomy  05/2004    secondary to ulcer  . Joint replacement Bilateral [id number]    total knee replacement   Social History:   reports that she quit smoking about 40 years ago. Her smoking use included Cigarettes. She has a 3 pack-year smoking history. She has never used smokeless tobacco. She reports that she does not drink alcohol or use illicit drugs.  Family History  Problem Relation Age of Onset  . Heart disease Father   . Colon cancer Sister   . Diabetes Brother   . Obesity Brother   . Mental retardation Daughter   . Obesity Daughter     Medications: Patient's Medications  New Prescriptions   No medications on file  Previous Medications   ACETAMINOPHEN (TYLENOL) 325 MG TABLET    Take 650 mg by mouth every 6 (six) hours as needed for moderate pain (pain).   AMLODIPINE (NORVASC) 5 MG TABLET    Take 10 mg by mouth daily.    BISOPROLOL (ZEBETA) 10 MG TABLET    Take 1 tablet (10 mg total) by mouth daily.   CALCIUM CARBONATE (TUMS EX) 750 MG CHEWABLE TABLET    Chew 2 tablets by mouth 3 (three) times daily as needed for heartburn (after meals if needed for heartburn).    CETIRIZINE (ZYRTEC) 10 MG TABLET    Take 10 mg by mouth daily.   CLONIDINE (CATAPRES) 0.1 MG TABLET    Take 1 tablet for blood pressure greater than 160/100.  May repeat in 1 hour if still greater than 160/100.   DICLOFENAC SODIUM (VOLTAREN) 1 % GEL    Apply 1 g topically 4 (four) times daily as needed (knees).    DOXAZOSIN (CARDURA) 8 MG TABLET    Take 8 mg by mouth daily. To control BP.   DOXEPIN (SINEQUAN) 10 MG CAPSULE    Take 10 mg by mouth at bedtime as needed (for rest).    FUROSEMIDE (LASIX) 40 MG TABLET    Take 40 mg by mouth daily. Take one daily   GLIPIZIDE (GLUCOTROL) 10 MG TABLET    Take 10 mg by mouth 2 (two) times daily.   GLUCOSE BLOOD TEST STRIP    1 each by Other route daily. Use once daily to test blood sugar.   HYDRALAZINE (APRESOLINE) 25 MG TABLET    Take 1 tablet (25 mg total) by mouth 3 (three) times daily.   HYDROCORTISONE CREAM 1 %    Apply topically 2 (two) times daily. Apply to right upper arm/bicep area bid x one week   LEVOFLOXACIN (LEVAQUIN) 500 MG TABLET    Take 1 tablet (500 mg total) by mouth daily.   LOSARTAN (COZAAR) 100 MG TABLET    Take 100 mg by mouth daily. Take one tablet daily for blood pressure   METFORMIN (GLUCOPHAGE) 500 MG TABLET    Take 1,000 mg by mouth 2 (two) times daily with a meal.   MULTIPLE VITAMIN (MULTIVITAMIN) CAPSULE    Take 1 capsule by mouth daily.   OMEPRAZOLE (PRILOSEC) 40 MG CAPSULE    Take 40 mg by mouth daily.   PREDNISONE (DELTASONE) 10 MG TABLET    Take 6 tablets (60 mg total) by mouth daily with breakfast. On 11/18/13 and decrease by 10mg  daily   SIMETHICONE (GAS RELIEF 80 PO)    Take 1 tablet by mouth daily.    SIMVASTATIN (ZOCOR) 20 MG TABLET    Take 20 mg by mouth daily.  Modified Medications   No medications  on file  Discontinued Medications   No medications on file     Physical Exam: Physical Exam  Constitutional: She is oriented to person, place, and time. She appears well-developed and well-nourished. No distress.  HENT:  Head: Normocephalic and atraumatic.  Right Ear: External ear normal.  Left Ear: External ear normal.  Nose: Nose normal.  Eyes: Conjunctivae and EOM  are normal. Pupils are equal, round, and reactive to light.  Neck: Neck supple. No JVD present. No tracheal deviation present. No thyromegaly present.  Cardiovascular: Normal rate, regular rhythm, normal heart sounds and intact distal pulses.  Exam reveals no gallop and no friction rub.   No murmur heard. Pulmonary/Chest: No respiratory distress. She has no wheezes. She has rales.  Bibasilar.   Abdominal: Bowel sounds are normal. She exhibits no distension and no mass. There is no tenderness.  Musculoskeletal: She exhibits edema (2-3+ bipedal).  Scars from bilateral TKR. Right knee seems to be loose when stressing the joint. No effusion. Instable gait. Driving a power scooter today. Trace edema BLE  Lymphadenopathy:    She has no cervical adenopathy.  Neurological: She is alert and oriented to person, place, and time. She has normal reflexes. No cranial nerve deficit. Coordination normal.  Diminished vibratory and monofilament testing in both feet.  Skin: Rash (Reddened rash on both legs. Some itching and burning associated with the rash.) noted. No erythema. No pallor.  Prior exam: 6 mm nodule of the left posterior neck at the SCM muscle. Not tender.  Psychiatric: She has a normal mood and affect. Her behavior is normal. Judgment and thought content normal.    Filed Vitals:   11/18/13 1513  BP: 138/88  Pulse: 78  Temp: 97.5 F (36.4 C)  TempSrc: Tympanic  Resp: 18      Labs reviewed: Basic Metabolic Panel:  Recent Labs  05/19/13  10/27/13  11/15/13 0458 11/16/13 0432 11/17/13 0439  NA 135*  < > 139  < > 138 141 140  K 4.1  < > 4.5  < > 3.5* 3.3* 3.2*  CL  --   --   --   < > 100 100 98  CO2  --   --   --   < > 26 29 33*  GLUCOSE  --   --   --   < > 249* 161* 126*  BUN 18  < > 15  < > 19 21 18   CREATININE 0.6  < > 0.6  < > 0.62 0.67 0.59  CALCIUM  --   --   --   < > 8.3* 8.3* 8.2*  TSH 2.43  --  2.42  --   --   --   --   < > = values in this interval not  displayed. Liver Function Tests:  Recent Labs  08/25/13 10/27/13 11/12/13 0840  AST 15 17 15   ALT 11 13 11   ALKPHOS 90 124 85  BILITOT  --   --  0.5  PROT  --   --  6.8  ALBUMIN  --   --  3.5   No results found for this basename: LIPASE, AMYLASE,  in the last 8760 hours No results found for this basename: AMMONIA,  in the last 8760 hours CBC:  Recent Labs  05/19/13 08/25/13 11/12/13 0840  11/15/13 0458 11/16/13 0432 11/17/13 0439  WBC 6.6 7.3 22.1*  < > 14.2* 14.1* 13.3*  NEUTROABS  --  5 20.1*  --   --   --   --  HGB 10.6* 13.3 12.1  < > 10.8* 11.5* 11.6*  HCT 33* 40 37.0  < > 32.7* 35.2* 35.4*  MCV  --   --  95.4  < > 92.6 94.6 94.1  PLT 283 267 179  < > 208 207 222  < > = values in this interval not displayed. Lipid Panel:  Recent Labs  08/25/13 10/27/13  CHOL 123 108  HDL 54 54  LDLCALC 26 33  TRIG 215* 103    Past Procedures:     Assessment/Plan Gastric ulcer with hemorrhage w/ hx of bleeding ulcers, stable. Continue omeprazole and prn TUMs EX p meals prn.    Hyponatremia - mild and asymptomatic and may be secondary to SIADH from pneumonia  - Trend sodium-->improved. 140 on the day of discharge  - No fluid restrictions at this time    Anemia of chronic disease likely secondary to marrow suppressions from acute illness or side effect of zosyn, stable around 10.8mg /dl    Leukocytosis - due to multiple infections and steroids, trending down  - Trend WBC    Diabetes mellitus -hyperglycemia likely due to steroids, A1c 7.2  - continue oral meds-Metformin 1000mg  bid and Glipizide 10mg  bid.  - SSI  - continue lantus 15 units while the patient is in the hospital  - continue aspart 5 units with meals   Hypertension - Taper steroids  - Continue norvasc 10 mg daily, bisoprolol 10mg  daily, ARB, doxazosin, and prn Clonidine 0.1mg  fro Bp>160/100 - On lasix 40 mg daily  - Added hydralazine 25mg  po TID  - Continue statin   Chest pain, pleuritic -  likely related to pneumonia, resolved  - Sinus tachycardia likely secondary to sepsis, resolved  - Telemetry: MAT, sinus tach, now NSR with occasional PVC and NSVT  - troponins neg    Acute respiratory failure with hypoxia -secondary to HCAP and acute COPD exacerbation,  -wheezing resolved.  - Repeat CXR: Persistent left mid-lung consolidation, diffuse bilateral opacities c/w interstitial pneumonitis or edema  - complete prednisone taper @ SNF with prednisone 60 mg starting 11/18/2013, decrease by 10 mg daily  - Duonebs q4h  - tx for pneumonia as above  - Flutter valve  - Wean oxygen as tolerated  - Continue lasix  - ECHO--EF 85-63%, grade 2 diastolic dysfunction  -Patient had ambulatory pulse oximetry which revealed that she desaturated to 88% on room air--she will discharge with 2 L nasal cannula   Septicemia due to Gram negative organism Coag neg staph and GNR Bacteremia  - amended report shows there was no GNR  - Repeat blood cultures: NGTD  - F/u speciation: Coag neg staph and GNR  - ID consult given two bacteria from same blood culture. Suspect coag neg staph may be contaminate (fever, WBC, tachycardia, tachypnea) due to HCAP and UTI. Temperature and WBC trending down.  - 1/2 Blood cultures growing GPC in clusters AND GNR  - Urine culture: E. Coli. pansensitive  - Legionella, S. pneumo urine ag both negative  - Sputum culture: moderate yeast (has thrush)  - Resp viral panel neg  - Continue Vancomycin and zosyn day 4---> 11/15/13 switched to Augmentin  - On 11/17/2013, the patient was noted to have a mild maculopapular rash on her right bicep area--there was concern this may been a reaction to penicillin  -Augmentin was discontinued, and patient was started on levofloxacin 500 mg daily  -Patient will complete 2 additional days which will complete 7 days of therapy @ SNF  Insomnia Prn Doxepin 10mg  hs available to her.     Family/ Staff Communication: observe the  patient.   Goals of Care: AL  Labs/tests ordered: CBC and BMP

## 2013-11-20 LAB — CULTURE, BLOOD (SINGLE): Culture: NO GROWTH

## 2013-11-21 DIAGNOSIS — D638 Anemia in other chronic diseases classified elsewhere: Secondary | ICD-10-CM | POA: Insufficient documentation

## 2013-11-21 DIAGNOSIS — G4700 Insomnia, unspecified: Secondary | ICD-10-CM | POA: Insufficient documentation

## 2013-11-21 DIAGNOSIS — E871 Hypo-osmolality and hyponatremia: Secondary | ICD-10-CM | POA: Insufficient documentation

## 2013-11-21 DIAGNOSIS — D72829 Elevated white blood cell count, unspecified: Secondary | ICD-10-CM | POA: Insufficient documentation

## 2013-11-21 DIAGNOSIS — R0781 Pleurodynia: Secondary | ICD-10-CM | POA: Insufficient documentation

## 2013-11-21 NOTE — Assessment & Plan Note (Signed)
-   likely related to pneumonia, resolved  - Sinus tachycardia likely secondary to sepsis, resolved  - Telemetry: MAT, sinus tach, now NSR with occasional PVC and NSVT  - troponins neg

## 2013-11-21 NOTE — Assessment & Plan Note (Signed)
-  secondary to HCAP and acute COPD exacerbation,  -wheezing resolved.  - Repeat CXR: Persistent left mid-lung consolidation, diffuse bilateral opacities c/w interstitial pneumonitis or edema  - complete prednisone taper @ SNF with prednisone 60 mg starting 11/18/2013, decrease by 10 mg daily  - Duonebs q4h  - tx for pneumonia as above  - Flutter valve  - Wean oxygen as tolerated  - Continue lasix  - ECHO--EF 27-61%, grade 2 diastolic dysfunction  -Patient had ambulatory pulse oximetry which revealed that she desaturated to 88% on room air--she will discharge with 2 L nasal cannula

## 2013-11-21 NOTE — Assessment & Plan Note (Addendum)
-   mild and asymptomatic and may be secondary to SIADH from pneumonia  - Trend sodium-->improved. 140 on the day of discharge  - No fluid restrictions at this time

## 2013-11-21 NOTE — Assessment & Plan Note (Addendum)
w/ hx of bleeding ulcers, stable. Continue omeprazole and prn TUMs EX p meals prn.     

## 2013-11-21 NOTE — Assessment & Plan Note (Addendum)
Coag neg staph and GNR Bacteremia  - amended report shows there was no GNR  - Repeat blood cultures: NGTD  - F/u speciation: Coag neg staph and GNR  - ID consult given two bacteria from same blood culture. Suspect coag neg staph may be contaminate (fever, WBC, tachycardia, tachypnea) due to HCAP and UTI. Temperature and WBC trending down.  - 1/2 Blood cultures growing GPC in clusters AND GNR  - Urine culture: E. Coli. pansensitive  - Legionella, S. pneumo urine ag both negative  - Sputum culture: moderate yeast (has thrush)  - Resp viral panel neg  - Continue Vancomycin and zosyn day 4---> 11/15/13 switched to Augmentin  - On 11/17/2013, the patient was noted to have a mild maculopapular rash on her right bicep area--there was concern this may been a reaction to penicillin  -Augmentin was discontinued, and patient was started on levofloxacin 500 mg daily  -Patient will complete 2 additional days which will complete 7 days of therapy @ SNF

## 2013-11-21 NOTE — Assessment & Plan Note (Signed)
-   due to multiple infections and steroids, trending down  - Trend WBC

## 2013-11-21 NOTE — Assessment & Plan Note (Addendum)
-   Taper steroids  - Continue norvasc 10 mg daily, bisoprolol 10mg  daily, ARB, doxazosin, and prn Clonidine 0.1mg  fro Bp>160/100 - On lasix 40 mg daily  - Added hydralazine 25mg  po TID  - Continue statin

## 2013-11-21 NOTE — Assessment & Plan Note (Addendum)
-  hyperglycemia likely due to steroids, A1c 7.2  - continue oral meds-Metformin 1000mg  bid and Glipizide 10mg  bid.  - SSI  - continue lantus 15 units while the patient is in the hospital  - continue aspart 5 units with meals

## 2013-11-21 NOTE — Assessment & Plan Note (Signed)
Prn Doxepin 10mg  hs available to her.

## 2013-11-21 NOTE — Assessment & Plan Note (Signed)
likely secondary to marrow suppressions from acute illness or side effect of zosyn, stable around 10.8mg /dl

## 2013-11-24 ENCOUNTER — Non-Acute Institutional Stay (SKILLED_NURSING_FACILITY): Payer: Medicare Other | Admitting: Internal Medicine

## 2013-11-24 DIAGNOSIS — I10 Essential (primary) hypertension: Secondary | ICD-10-CM | POA: Diagnosis not present

## 2013-11-24 DIAGNOSIS — E1159 Type 2 diabetes mellitus with other circulatory complications: Secondary | ICD-10-CM | POA: Diagnosis not present

## 2013-11-24 DIAGNOSIS — J181 Lobar pneumonia, unspecified organism: Secondary | ICD-10-CM

## 2013-11-24 DIAGNOSIS — J189 Pneumonia, unspecified organism: Secondary | ICD-10-CM | POA: Diagnosis not present

## 2013-11-24 DIAGNOSIS — R531 Weakness: Secondary | ICD-10-CM | POA: Diagnosis not present

## 2013-11-24 DIAGNOSIS — J9601 Acute respiratory failure with hypoxia: Secondary | ICD-10-CM | POA: Diagnosis not present

## 2013-11-24 DIAGNOSIS — J441 Chronic obstructive pulmonary disease with (acute) exacerbation: Secondary | ICD-10-CM | POA: Diagnosis not present

## 2013-11-24 DIAGNOSIS — E1151 Type 2 diabetes mellitus with diabetic peripheral angiopathy without gangrene: Secondary | ICD-10-CM

## 2013-12-06 ENCOUNTER — Encounter: Payer: Self-pay | Admitting: Nurse Practitioner

## 2013-12-06 ENCOUNTER — Non-Acute Institutional Stay (SKILLED_NURSING_FACILITY): Payer: Medicare Other | Admitting: Nurse Practitioner

## 2013-12-06 DIAGNOSIS — M25561 Pain in right knee: Secondary | ICD-10-CM

## 2013-12-06 DIAGNOSIS — A4150 Gram-negative sepsis, unspecified: Secondary | ICD-10-CM

## 2013-12-06 DIAGNOSIS — I10 Essential (primary) hypertension: Secondary | ICD-10-CM

## 2013-12-06 DIAGNOSIS — E1159 Type 2 diabetes mellitus with other circulatory complications: Secondary | ICD-10-CM

## 2013-12-06 DIAGNOSIS — E1151 Type 2 diabetes mellitus with diabetic peripheral angiopathy without gangrene: Secondary | ICD-10-CM

## 2013-12-06 DIAGNOSIS — R609 Edema, unspecified: Secondary | ICD-10-CM

## 2013-12-06 DIAGNOSIS — K219 Gastro-esophageal reflux disease without esophagitis: Secondary | ICD-10-CM

## 2013-12-06 DIAGNOSIS — J449 Chronic obstructive pulmonary disease, unspecified: Secondary | ICD-10-CM

## 2013-12-06 NOTE — Assessment & Plan Note (Signed)
-  hyperglycemia likely due to steroids, A1c 7.2  - continue oral meds-Metformin 1000mg  bid and Glipizide 10mg  bid.  - off insulin. Continue to monitor CBGs

## 2013-12-06 NOTE — Assessment & Plan Note (Signed)
Will schedule Doxepin 10mg  qhs from prn since the patient requested it nightly.

## 2013-12-06 NOTE — Assessment & Plan Note (Signed)
-   Continue norvasc 10 mg daily, bisoprolol 10mg daily, ARB, doxazosin, and prn Clonidine 0.1mg fro Bp>160/100 - On lasix 40 mg daily  - Added hydralazine 25mg po TID while in hospital - Continue statin  -Bp controlled.   

## 2013-12-06 NOTE — Progress Notes (Signed)
Patient ID: [name], female   DOB: 02/06/1917, 80 y.o.   MRN: [id number]   Code Status: DNR  Allergies  Allergen Reactions  . Adhesive [Tape]     unknown  . Aspirin     unknown  . Codeine     unknown  . Enalapril Cough  . Hydrocodone     unknown  . Pantoprazole Sodium     unknown    Chief Complaint  Patient presents with  . Medical Management of Chronic Issues    HPI: Patient is a 80 y.o. female seen in the SNF at [facility] today for evaluation of s/p hospitalizatoin and other chronic medical conditions.     Hospitalized 11/12/2013 -11/17/2013 for COPD exacerbation and septicemia due to Gram Neg bacteremia.   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  - off insulin. Continue to monitor CBGs     Septicemia due to Gram negative organism    Fully treated and clinically healed     Pain in joint, lower leg    Chronic right knee pain. Has had TKR 1998. Followed by Dr. Lorin Mercy. Schedule Tylenol 500mg  bid for now.     Hypertension - Primary (Chronic)    - Continue norvasc 10 mg daily, bisoprolol 10mg  daily, ARB, doxazosin, and prn Clonidine 0.1mg  fro Bp>160/100 - On lasix 40 mg daily  - Added hydralazine 25mg  po TID while in hospital - Continue statin  -Bp controlled.      Esophageal reflux    w/ hx of bleeding ulcers, stable. Continue omeprazole and prn TUMs EX p meals prn.       Edema (Chronic)    Minimal. Continue Furosemide 40mg  daily.     COPD (chronic obstructive pulmonary disease) (Chronic)    secondary to HCAP and acute COPD exacerbation,  -wheezing resolved.  - Repeat CXR: Persistent left mid-lung consolidation, diffuse bilateral opacities c/w interstitial pneumonitis or edema  - complete prednisone taper @ SNF, presently Prednisone 10 mg daily maintenance dose.  - Duonebs  q4h - Flutter valve  - Wean oxygen as tolerated  - Continue lasix  - ECHO--EF 83-38%, grade 2 diastolic dysfunction          Review of Systems:  Review of Systems  Constitutional: Positive for malaise/fatigue. Negative for fever, chills, weight loss and diaphoresis.       Baseline mobility is motorized scooter  HENT: Positive for hearing loss. Negative for congestion, ear discharge, ear pain, nosebleeds, sore throat and tinnitus.   Eyes: Negative for blurred vision, double vision, photophobia and pain.  Respiratory: Positive for cough and shortness of breath. Negative for hemoptysis, sputum production, wheezing and stridor.   Cardiovascular: Positive for leg swelling. Negative for chest pain, palpitations, orthopnea, claudication and PND.       Trace  Gastrointestinal: Negative for heartburn, nausea, vomiting, abdominal pain, diarrhea, constipation, blood in stool and melena.  Genitourinary: Positive for frequency. Negative for dysuria, urgency, hematuria and flank pain.  Musculoskeletal: Positive for joint pain. Negative for myalgias, back pain, falls and neck pain.       R knee  Skin: Negative for itching and rash.  Neurological: Positive for weakness. Negative for dizziness, tingling, tremors, sensory change, speech change, focal weakness, seizures, loss of consciousness and headaches.  Endo/Heme/Allergies: Negative for environmental allergies and polydipsia. Does not  bruise/bleed easily.  Psychiatric/Behavioral: Negative for depression, suicidal ideas, hallucinations, memory loss and substance abuse. The patient has insomnia. The patient is not nervous/anxious.    (type .ros)  Past Medical History  Diagnosis Date  . Type II or unspecified type diabetes mellitus without mention of complication, not stated as uncontrolled   . Hypertension   . Arthritis   . Ulcer   . COPD (chronic obstructive pulmonary disease)   . Regional enteritis of unspecified site   . Other malaise and  fatigue   . Anemia, unspecified   . Other and unspecified hyperlipidemia   . Hypoxemia   . Pneumonia, organism unspecified   . Mucopurulent chronic bronchitis   . Palpitations   . Nontoxic uninodular goiter   . Abnormality of gait   . Fall from other slipping, tripping, or stumbling   . Cholelithiases   . Closed fracture of unspecified part of ramus of mandible   . Disturbance of skin sensation   . Sciatica   . Tension headache   . Coronary atherosclerosis of unspecified type of vessel, native or graft   . Osteoarthrosis, unspecified whether generalized or localized, unspecified site   . Insomnia, unspecified   . Edema   . Tension headache   . Pain in joint, lower leg 2010    right knee  . Unspecified venous (peripheral) insufficiency   . Esophageal reflux   . Rosacea   . Cataract 1990    Dr. Katy Fitch  . Pain in joint, shoulder region 2013    left  . Gastric ulcer with hemorrhage 2008  . Diabetes mellitus type 2 in nonobese    Past Surgical History  Procedure Laterality Date  . Total knee arthroplasty Bilateral 1993, 1998    knees  . Other surgical history  2008    Ulcer surgery   . Small intestine surgery    . Eye surgery Bilateral 1990    Dr. Katy Fitch  . Gastrojejunostomy  05/2004    secondary to ulcer  . Joint replacement Bilateral [phone]    total knee replacement   Social History:   reports that she quit smoking about 40 years ago. Her smoking use included Cigarettes. She has a 3 pack-year smoking history. She has never used smokeless tobacco. She reports that she does not drink alcohol or use illicit drugs.  Family History  Problem Relation Age of Onset  . Heart disease Father   . Colon cancer Sister   . Diabetes Brother   . Obesity Brother   . Mental retardation Daughter   . Obesity Daughter     Medications: Patient's Medications  New Prescriptions   No medications on file  Previous Medications   ACETAMINOPHEN (TYLENOL) 325 MG TABLET    Take 650 mg  by mouth every 6 (six) hours as needed for moderate pain (pain).   AMLODIPINE (NORVASC) 5 MG TABLET    Take 10 mg by mouth daily.    BISOPROLOL (ZEBETA) 10 MG TABLET    Take 1 tablet (10 mg total) by mouth daily.   CALCIUM CARBONATE (TUMS EX) 750 MG CHEWABLE TABLET    Chew 2 tablets by mouth 3 (three) times daily as needed for heartburn (after meals if needed for heartburn).    CETIRIZINE (ZYRTEC) 10 MG TABLET    Take 10 mg by mouth daily.   CLONIDINE (CATAPRES) 0.1 MG TABLET    Take 1 tablet for blood pressure greater than 160/100. May repeat in 1 hour if still greater than 160/100.  DICLOFENAC SODIUM (VOLTAREN) 1 % GEL    Apply 1 g topically 4 (four) times daily as needed (knees).    DOXAZOSIN (CARDURA) 8 MG TABLET    Take 8 mg by mouth daily. To control BP.   DOXEPIN (SINEQUAN) 10 MG CAPSULE    Take 10 mg by mouth at bedtime as needed (for rest).    FUROSEMIDE (LASIX) 40 MG TABLET    Take 40 mg by mouth daily. Take one daily   GLIPIZIDE (GLUCOTROL) 10 MG TABLET    Take 10 mg by mouth 2 (two) times daily.   GLUCOSE BLOOD TEST STRIP    1 each by Other route daily. Use once daily to test blood sugar.   HYDRALAZINE (APRESOLINE) 25 MG TABLET    Take 1 tablet (25 mg total) by mouth 3 (three) times daily.   HYDROCORTISONE CREAM 1 %    Apply topically 2 (two) times daily. Apply to right upper arm/bicep area bid x one week   LEVOFLOXACIN (LEVAQUIN) 500 MG TABLET    Take 1 tablet (500 mg total) by mouth daily.   LOSARTAN (COZAAR) 100 MG TABLET    Take 100 mg by mouth daily. Take one tablet daily for blood pressure   METFORMIN (GLUCOPHAGE) 500 MG TABLET    Take 1,000 mg by mouth 2 (two) times daily with a meal.   MULTIPLE VITAMIN (MULTIVITAMIN) CAPSULE    Take 1 capsule by mouth daily.   OMEPRAZOLE (PRILOSEC) 40 MG CAPSULE    Take 40 mg by mouth daily.   PREDNISONE (DELTASONE) 10 MG TABLET    Take 6 tablets (60 mg total) by mouth daily with breakfast. On 11/18/13 and decrease by 10mg  daily   SIMETHICONE  (GAS RELIEF 80 PO)    Take 1 tablet by mouth daily.    SIMVASTATIN (ZOCOR) 20 MG TABLET    Take 20 mg by mouth daily.  Modified Medications   No medications on file  Discontinued Medications   No medications on file     Physical Exam: Physical Exam  Constitutional: She is oriented to person, place, and time. She appears well-developed and well-nourished. No distress.  HENT:  Head: Normocephalic and atraumatic.  Right Ear: External ear normal.  Left Ear: External ear normal.  Nose: Nose normal.  Eyes: Conjunctivae and EOM are normal. Pupils are equal, round, and reactive to light.  Neck: Neck supple. No JVD present. No tracheal deviation present. No thyromegaly present.  Cardiovascular: Normal rate, regular rhythm, normal heart sounds and intact distal pulses.  Exam reveals no gallop and no friction rub.   No murmur heard. Pulmonary/Chest: No respiratory distress. She has no wheezes. She has rales.  Bibasilar.   Abdominal: Bowel sounds are normal. She exhibits no distension and no mass. There is no tenderness.  Musculoskeletal: She exhibits edema (2-3+ bipedal).  Scars from bilateral TKR. Right knee seems to be loose when stressing the joint. No effusion. Instable gait. Driving a power scooter today. Trace edema BLE  Lymphadenopathy:    She has no cervical adenopathy.  Neurological: She is alert and oriented to person, place, and time. She has normal reflexes. No cranial nerve deficit. Coordination normal.  Diminished vibratory and monofilament testing in both feet.  Skin: Rash (Reddened rash on both legs. Some itching and burning associated with the rash.) noted. No erythema. No pallor.  Prior exam: 6 mm nodule of the left posterior neck at the SCM muscle. Not tender.  Psychiatric: She has a normal mood and  affect. Her behavior is normal. Judgment and thought content normal.    Filed Vitals:   12/06/13 1103  BP: 136/70  Pulse: 70  Temp: 98.1 F (36.7 C)  TempSrc: Tympanic    Resp: 14      Labs reviewed: Basic Metabolic Panel:  Recent Labs  05/19/13  10/27/13  11/15/13 0458 11/16/13 0432 11/17/13 0439  NA 135*  < > 139  < > 138 141 140  K 4.1  < > 4.5  < > 3.5* 3.3* 3.2*  CL  --   --   --   < > 100 100 98  CO2  --   --   --   < > 26 29 33*  GLUCOSE  --   --   --   < > 249* 161* 126*  BUN 18  < > 15  < > 19 21 18   CREATININE 0.6  < > 0.6  < > 0.62 0.67 0.59  CALCIUM  --   --   --   < > 8.3* 8.3* 8.2*  TSH 2.43  --  2.42  --   --   --   --   < > = values in this interval not displayed. Liver Function Tests:  Recent Labs  08/25/13 10/27/13 11/12/13 0840  AST 15 17 15   ALT 11 13 11   ALKPHOS 90 124 85  BILITOT  --   --  0.5  PROT  --   --  6.8  ALBUMIN  --   --  3.5   No results for input(s): LIPASE, AMYLASE in the last 8760 hours. No results for input(s): AMMONIA in the last 8760 hours. CBC:  Recent Labs  08/25/13 11/12/13 0840  11/15/13 0458 11/16/13 0432 11/17/13 0439  WBC 7.3 22.1*  < > 14.2* 14.1* 13.3*  NEUTROABS 5 20.1*  --   --   --   --   HGB 13.3 12.1  < > 10.8* 11.5* 11.6*  HCT 40 37.0  < > 32.7* 35.2* 35.4*  MCV  --  95.4  < > 92.6 94.6 94.1  PLT 267 179  < > 208 207 222  < > = values in this interval not displayed. Lipid Panel:  Recent Labs  08/25/13 10/27/13  CHOL 123 108  HDL 54 54  LDLCALC 26 33  TRIG 215* 103    Past Procedures:  11/14/13 CXR  IMPRESSION: Persistent left mid lung consolidation, consistent with pneumonia. Increasing, diffuse bilateral interstitial opacities could reflect interstitial pneumonitis or edema.    Assessment/Plan Type 2 diabetes with decreased circulation -hyperglycemia likely due to steroids, A1c 7.2  - continue oral meds-Metformin 1000mg  bid and Glipizide 10mg  bid.  - off insulin. Continue to monitor CBGs   COPD (chronic obstructive pulmonary disease) secondary to HCAP and acute COPD exacerbation,  -wheezing resolved.  - Repeat CXR: Persistent left mid-lung  consolidation, diffuse bilateral opacities c/w interstitial pneumonitis or edema  - complete prednisone taper @ SNF, presently Prednisone 10 mg daily maintenance dose.  - Duonebs q4h - Flutter valve  - Wean oxygen as tolerated  - Continue lasix  - ECHO--EF 71-06%, grade 2 diastolic dysfunction     Hypertension - Continue norvasc 10 mg daily, bisoprolol 10mg  daily, ARB, doxazosin, and prn Clonidine 0.1mg  fro Bp>160/100 - On lasix 40 mg daily  - Added hydralazine 25mg  po TID while in hospital - Continue statin  -Bp controlled.    Esophageal reflux w/ hx of bleeding ulcers, stable. Continue omeprazole  and prn TUMs EX p meals prn.     Septicemia due to Gram negative organism Fully treated and clinically healed   Edema Minimal. Continue Furosemide 40mg  daily.   Insomnia, unspecified Will schedule Doxepin 10mg  qhs from prn since the patient requested it nightly.   Pain in joint, lower leg Chronic right knee pain. Has had TKR 1998. Followed by Dr. Lorin Mercy. Schedule Tylenol 500mg  bid for now.     Family/ Staff Communication: observe the patient.   Goals of Care: AL  Labs/tests ordered: none

## 2013-12-06 NOTE — Assessment & Plan Note (Signed)
Minimal. Continue Furosemide 40mg daily.   

## 2013-12-06 NOTE — Assessment & Plan Note (Signed)
w/ hx of bleeding ulcers, stable. Continue omeprazole and prn TUMs EX p meals prn.     

## 2013-12-06 NOTE — Assessment & Plan Note (Signed)
Chronic right knee pain. Has had TKR 1998. Followed by Dr. Yates. Schedule Tylenol 500mg bid for now.   

## 2013-12-06 NOTE — Assessment & Plan Note (Signed)
Fully treated and clinically healed.  

## 2013-12-06 NOTE — Assessment & Plan Note (Signed)
secondary to HCAP and acute COPD exacerbation,  -wheezing resolved.  - Repeat CXR: Persistent left mid-lung consolidation, diffuse bilateral opacities c/w interstitial pneumonitis or edema  - complete prednisone taper @ SNF, presently Prednisone 10 mg daily maintenance dose.  - Duonebs q4h - Flutter valve  - Wean oxygen as tolerated  - Continue lasix  - ECHO--EF 44-62%, grade 2 diastolic dysfunction

## 2013-12-16 ENCOUNTER — Encounter: Payer: Self-pay | Admitting: *Deleted

## 2014-01-31 ENCOUNTER — Non-Acute Institutional Stay: Payer: Medicare Other | Admitting: Nurse Practitioner

## 2014-01-31 ENCOUNTER — Encounter: Payer: Self-pay | Admitting: Nurse Practitioner

## 2014-01-31 DIAGNOSIS — I10 Essential (primary) hypertension: Secondary | ICD-10-CM

## 2014-01-31 DIAGNOSIS — N390 Urinary tract infection, site not specified: Secondary | ICD-10-CM

## 2014-01-31 DIAGNOSIS — K219 Gastro-esophageal reflux disease without esophagitis: Secondary | ICD-10-CM

## 2014-01-31 DIAGNOSIS — E1159 Type 2 diabetes mellitus with other circulatory complications: Secondary | ICD-10-CM

## 2014-01-31 DIAGNOSIS — R609 Edema, unspecified: Secondary | ICD-10-CM

## 2014-01-31 DIAGNOSIS — E1151 Type 2 diabetes mellitus with diabetic peripheral angiopathy without gangrene: Secondary | ICD-10-CM

## 2014-01-31 DIAGNOSIS — J439 Emphysema, unspecified: Secondary | ICD-10-CM

## 2014-01-31 DIAGNOSIS — M25561 Pain in right knee: Secondary | ICD-10-CM

## 2014-01-31 DIAGNOSIS — D638 Anemia in other chronic diseases classified elsewhere: Secondary | ICD-10-CM

## 2014-01-31 NOTE — Assessment & Plan Note (Addendum)
01/31/14 UA pos nitrite, 7-10 wbc, many bacteria. Empirical ABT-Nitrofurantoin 100mg  bid x 7days. May adjust ABT when final culture result became available.  02/02/14 urine culture: K. Pneumoniae complete Septra DS bid x 10days.

## 2014-01-31 NOTE — Assessment & Plan Note (Signed)
Chronic right knee pain. Has had TKR 1998. Followed by Dr. Lorin Mercy. Schedule Tylenol 500mg  bid for now.

## 2014-01-31 NOTE — Assessment & Plan Note (Signed)
-   Continue norvasc 10 mg daily, bisoprolol 10mg  daily, ARB, doxazosin, and prn Clonidine 0.1mg  fro Bp>160/100 - On lasix 40 mg daily  - Added hydralazine 25mg  po TID while in hospital - Continue statin  -Bp controlled.

## 2014-01-31 NOTE — Assessment & Plan Note (Signed)
Stable, continue Doxepin 10mg  qhs

## 2014-01-31 NOTE — Assessment & Plan Note (Signed)
stable around 10.8mg /dl

## 2014-01-31 NOTE — Assessment & Plan Note (Signed)
Minimal. Continue Furosemide 40mg  daily.

## 2014-01-31 NOTE — Assessment & Plan Note (Signed)
-  hyperglycemia likely due to steroids, A1c 7.2  - continue oral meds-Metformin 1000mg  bid and Glipizide 10mg  bid.  - off insulin. Continue to monitor CBGs

## 2014-01-31 NOTE — Assessment & Plan Note (Signed)
secondary to HCAP and acute COPD exacerbation,  -wheezing resolved.  - Repeat CXR: Persistent left mid-lung consolidation, diffuse bilateral opacities c/w interstitial pneumonitis or edema  - presently Prednisone 10 mg daily maintenance dose.  - Duonebs q4h - Flutter valve  - Wean oxygen as tolerated  - Continue lasix  - ECHO--EF 09-62%, grade 2 diastolic dysfunction

## 2014-01-31 NOTE — Progress Notes (Signed)
Patient ID: [name], female   DOB: Oct 14, 1917, 80 y.o.   MRN: [id number]   Code Status: DNR  Allergies  Allergen Reactions  . Adhesive [Tape]     unknown  . Aspirin     unknown  . Codeine     unknown  . Enalapril Cough  . Hydrocodone     unknown  . Pantoprazole Sodium     unknown    Chief Complaint  Patient presents with  . Medical Management of Chronic Issues  . Acute Visit    UTI    HPI: Patient is a 80 y.o. female seen in the AL at [facility] today for evaluation of UTI and chronic medical conditions.     Hospitalized 11/12/2013 -11/17/2013 for COPD exacerbation and septicemia due to Gram Neg bacteremia.   Problem List Items Addressed This Visit    UTI (urinary tract infection) - Primary    01/31/14 UA pos nitrite, 7-10 wbc, many bacteria. Empirical ABT-Nitrofurantoin 100mg  bid x 7days. May adjust ABT when final culture result became available.  02/02/14 urine culture: K. Pneumoniae complete Septra DS bid x 10days.      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  - off insulin. Continue to monitor CBGs      Pain in joint, lower leg    Chronic right knee pain. Has had TKR 1998. Followed by Dr. Lorin Mercy. Schedule Tylenol 500mg  bid for now.      Hypertension (Chronic)    - Continue norvasc 10 mg daily, bisoprolol 10mg  daily, ARB, doxazosin, and prn Clonidine 0.1mg  fro Bp>160/100 - On lasix 40 mg daily  - Added hydralazine 25mg  po TID while in hospital - Continue statin  -Bp controlled.      Esophageal reflux    w/ hx of bleeding ulcers, stable. Continue omeprazole and prn TUMs EX p meals prn.      Edema (Chronic)    Minimal. Continue Furosemide 40mg  daily.      COPD (chronic obstructive pulmonary disease) (Chronic)    secondary to HCAP and acute COPD exacerbation,  -wheezing resolved.  - Repeat CXR: Persistent left  mid-lung consolidation, diffuse bilateral opacities c/w interstitial pneumonitis or edema  - presently Prednisone 10 mg daily maintenance dose.  - Duonebs q4h - Flutter valve  - Wean oxygen as tolerated  - Continue lasix  - ECHO--EF 91-47%, grade 2 diastolic dysfunction       Anemia of chronic disease     stable around 10.8mg /dl        Review of Systems:  Review of Systems  Constitutional: Positive for malaise/fatigue. Negative for fever, chills, weight loss and diaphoresis.       Baseline mobility is motorized scooter  HENT: Positive for hearing loss. Negative for congestion, ear discharge, ear pain, nosebleeds, sore throat and tinnitus.   Eyes: Negative for blurred vision, double vision, photophobia and pain.  Respiratory: Positive for cough and shortness of breath. Negative for hemoptysis, sputum production, wheezing and stridor.   Cardiovascular: Positive for leg swelling. Negative for chest pain, palpitations, orthopnea, claudication and PND.       Trace. Right knee pain.   Gastrointestinal: Negative for heartburn, nausea, vomiting, abdominal pain, diarrhea, constipation, blood in stool and melena.  Genitourinary: Positive for frequency. Negative for dysuria, urgency, hematuria and flank pain.       No better since ABT  Musculoskeletal: Positive for joint pain. Negative for myalgias, back pain, falls and neck pain.       R knee  Skin: Negative for itching and rash.  Neurological: Positive for weakness. Negative for dizziness, tingling, tremors, sensory change, speech change, focal weakness, seizures, loss of consciousness and headaches.  Endo/Heme/Allergies: Negative for environmental allergies and polydipsia. Does not bruise/bleed easily.  Psychiatric/Behavioral: Negative for depression, suicidal ideas, hallucinations, memory loss and substance abuse. The patient has insomnia. The patient is not nervous/anxious.    (type .ros)  Past Medical History  Diagnosis Date  . Type  II or unspecified type diabetes mellitus without mention of complication, not stated as uncontrolled   . Hypertension   . Arthritis   . Ulcer   . COPD (chronic obstructive pulmonary disease)   . Regional enteritis of unspecified site   . Other malaise and fatigue   . Anemia, unspecified   . Other and unspecified hyperlipidemia   . Hypoxemia   . Pneumonia, organism unspecified   . Mucopurulent chronic bronchitis   . Palpitations   . Nontoxic uninodular goiter   . Abnormality of gait   . Fall from other slipping, tripping, or stumbling   . Cholelithiases   . Closed fracture of unspecified part of ramus of mandible   . Disturbance of skin sensation   . Sciatica   . Tension headache   . Coronary atherosclerosis of unspecified type of vessel, native or graft   . Osteoarthrosis, unspecified whether generalized or localized, unspecified site   . Insomnia, unspecified   . Edema   . Tension headache   . Pain in joint, lower leg 2010    right knee  . Unspecified venous (peripheral) insufficiency   . Esophageal reflux   . Rosacea   . Cataract 1990    Dr. Katy Fitch  . Pain in joint, shoulder region 2013    left  . Gastric ulcer with hemorrhage 2008  . Diabetes mellitus type 2 in nonobese    Past Surgical History  Procedure Laterality Date  . Total knee arthroplasty Bilateral 1993, 1998    knees  . Other surgical history  2008    Ulcer surgery   . Small intestine surgery    . Eye surgery Bilateral 1990    Dr. Katy Fitch  . Gastrojejunostomy  05/2004    secondary to ulcer  . Joint replacement Bilateral [phone]    total knee replacement   Social History:   reports that she quit smoking about 41 years ago. Her smoking use included Cigarettes. She has a 3 pack-year smoking history. She has never used smokeless tobacco. She reports that she does not drink alcohol or use illicit drugs.  Family History  Problem Relation Age of Onset  . Heart disease Father   . Colon cancer Sister   .  Diabetes Brother   . Obesity Brother   . Mental retardation Daughter   . Obesity Daughter     Medications: Patient's Medications  New Prescriptions   No medications on file  Previous Medications   ACETAMINOPHEN (TYLENOL) 325 MG TABLET    Take 650 mg by mouth every 6 (six) hours as needed for moderate pain (pain).   AMLODIPINE (NORVASC) 5 MG TABLET    Take 10 mg by mouth daily.    ATORVASTATIN (LIPITOR) 10 MG TABLET    Take 10 mg by mouth daily.   BISOPROLOL (ZEBETA) 10 MG TABLET    Take 1 tablet (10 mg total) by mouth daily.  CALCIUM CARBONATE (TUMS EX) 750 MG CHEWABLE TABLET    Chew 2 tablets by mouth 3 (three) times daily as needed for heartburn (after meals if needed for heartburn).    CETIRIZINE (ZYRTEC) 10 MG TABLET    Take 10 mg by mouth daily.   CLONIDINE (CATAPRES) 0.1 MG TABLET    Take 1 tablet for blood pressure greater than 160/100. May repeat in 1 hour if still greater than 160/100.   DICLOFENAC SODIUM (VOLTAREN) 1 % GEL    Apply 1 g topically 4 (four) times daily as needed (knees).    DOXAZOSIN (CARDURA) 8 MG TABLET    Take 8 mg by mouth daily. To control BP.   DOXEPIN (SINEQUAN) 10 MG CAPSULE    Take 10 mg by mouth at bedtime as needed (for rest).    FUROSEMIDE (LASIX) 40 MG TABLET    Take 40 mg by mouth daily. Take one daily   GLIPIZIDE (GLUCOTROL) 10 MG TABLET    Take 10 mg by mouth 2 (two) times daily.   GLUCOSE BLOOD TEST STRIP    1 each by Other route daily. Use once daily to test blood sugar.   HYDRALAZINE (APRESOLINE) 25 MG TABLET    Take 1 tablet (25 mg total) by mouth 3 (three) times daily.   HYDROCORTISONE CREAM 1 %    Apply topically 2 (two) times daily. Apply to right upper arm/bicep area bid x one week   LEVOFLOXACIN (LEVAQUIN) 500 MG TABLET    Take 1 tablet (500 mg total) by mouth daily.   LOSARTAN (COZAAR) 100 MG TABLET    Take 100 mg by mouth daily. Take one tablet daily for blood pressure   METFORMIN (GLUCOPHAGE) 500 MG TABLET    Take 1,000 mg by mouth 2  (two) times daily with a meal.   MULTIPLE VITAMIN (MULTIVITAMIN) CAPSULE    Take 1 capsule by mouth daily.   OMEPRAZOLE (PRILOSEC) 40 MG CAPSULE    Take 40 mg by mouth daily.   PREDNISONE (DELTASONE) 10 MG TABLET    Take 6 tablets (60 mg total) by mouth daily with breakfast. On 11/18/13 and decrease by 10mg  daily   SIMETHICONE (GAS RELIEF 80 PO)    Take 1 tablet by mouth daily.    SIMVASTATIN (ZOCOR) 20 MG TABLET    Take 20 mg by mouth daily.  Modified Medications   No medications on file  Discontinued Medications   No medications on file     Physical Exam: Physical Exam  Constitutional: She is oriented to person, place, and time. She appears well-developed and well-nourished. No distress.  HENT:  Head: Normocephalic and atraumatic.  Right Ear: External ear normal.  Left Ear: External ear normal.  Nose: Nose normal.  Eyes: Conjunctivae and EOM are normal. Pupils are equal, round, and reactive to light.  Neck: Neck supple. No JVD present. No tracheal deviation present. No thyromegaly present.  Cardiovascular: Normal rate, regular rhythm, normal heart sounds and intact distal pulses.  Exam reveals no gallop and no friction rub.   No murmur heard. Pulmonary/Chest: No respiratory distress. She has no wheezes. She has rales.  Bibasilar.   Abdominal: Bowel sounds are normal. She exhibits no distension and no mass. There is no tenderness.  Musculoskeletal: She exhibits edema (2-3+ bipedal) and tenderness.  Scars from bilateral TKR. Right knee seems to be loose when stressing the joint. No effusion. Instable gait. Driving a power scooter today. Trace edema BLE Chronic R knee pain  Lymphadenopathy:  She has no cervical adenopathy.  Neurological: She is alert and oriented to person, place, and time. She has normal reflexes. No cranial nerve deficit. Coordination normal.  Diminished vibratory and monofilament testing in both feet.  Skin: Rash (Reddened rash on both legs. Some itching and  burning associated with the rash.) noted. No erythema. No pallor.  Prior exam: 6 mm nodule of the left posterior neck at the SCM muscle. Not tender.  Psychiatric: She has a normal mood and affect. Her behavior is normal. Judgment and thought content normal.    Filed Vitals:   01/31/14 1445  BP: 126/86  Pulse: 67  Temp: 98.2 F (36.8 C)  TempSrc: Tympanic  Resp: 20      Labs reviewed: Basic Metabolic Panel:  Recent Labs  05/19/13  10/27/13  11/15/13 0458 11/16/13 0432 11/17/13 0439  NA 135*  < > 139  < > 138 141 140  K 4.1  < > 4.5  < > 3.5* 3.3* 3.2*  CL  --   --   --   < > 100 100 98  CO2  --   --   --   < > 26 29 33*  GLUCOSE  --   --   --   < > 249* 161* 126*  BUN 18  < > 15  < > 19 21 18   CREATININE 0.6  < > 0.6  < > 0.62 0.67 0.59  CALCIUM  --   --   --   < > 8.3* 8.3* 8.2*  TSH 2.43  --  2.42  --   --   --   --   < > = values in this interval not displayed. Liver Function Tests:  Recent Labs  08/25/13 10/27/13 11/12/13 0840  AST 15 17 15   ALT 11 13 11   ALKPHOS 90 124 85  BILITOT  --   --  0.5  PROT  --   --  6.8  ALBUMIN  --   --  3.5   No results for input(s): LIPASE, AMYLASE in the last 8760 hours. No results for input(s): AMMONIA in the last 8760 hours. CBC:  Recent Labs  08/25/13 11/12/13 0840  11/15/13 0458 11/16/13 0432 11/17/13 0439  WBC 7.3 22.1*  < > 14.2* 14.1* 13.3*  NEUTROABS 5 20.1*  --   --   --   --   HGB 13.3 12.1  < > 10.8* 11.5* 11.6*  HCT 40 37.0  < > 32.7* 35.2* 35.4*  MCV  --  95.4  < > 92.6 94.6 94.1  PLT 267 179  < > 208 207 222  < > = values in this interval not displayed. Lipid Panel:  Recent Labs  08/25/13 10/27/13  CHOL 123 108  HDL 54 54  LDLCALC 26 33  TRIG 215* 103    Past Procedures:  11/14/13 CXR  IMPRESSION: Persistent left mid lung consolidation, consistent with pneumonia. Increasing, diffuse bilateral interstitial opacities could reflect interstitial pneumonitis or  edema.    Assessment/Plan UTI (urinary tract infection) 01/31/14 UA pos nitrite, 7-10 wbc, many bacteria. Empirical ABT-Nitrofurantoin 100mg  bid x 7days. May adjust ABT when final culture result became available.  02/02/14 urine culture: K. Pneumoniae complete Septra DS bid x 10days.    Anemia of chronic disease  stable around 10.8mg /dl   COPD (chronic obstructive pulmonary disease) secondary to HCAP and acute COPD exacerbation,  -wheezing resolved.  - Repeat CXR: Persistent left mid-lung consolidation, diffuse bilateral opacities c/w interstitial  pneumonitis or edema  - presently Prednisone 10 mg daily maintenance dose.  - Duonebs q4h - Flutter valve  - Wean oxygen as tolerated  - Continue lasix  - ECHO--EF 03-40%, grade 2 diastolic dysfunction     Edema Minimal. Continue Furosemide 40mg  daily.    Esophageal reflux w/ hx of bleeding ulcers, stable. Continue omeprazole and prn TUMs EX p meals prn.    Hypertension - Continue norvasc 10 mg daily, bisoprolol 10mg  daily, ARB, doxazosin, and prn Clonidine 0.1mg  fro Bp>160/100 - On lasix 40 mg daily  - Added hydralazine 25mg  po TID while in hospital - Continue statin  -Bp controlled.    Insomnia, unspecified Stable, continue Doxepin 10mg  qhs    Pain in joint, lower leg Chronic right knee pain. Has had TKR 1998. Followed by Dr. Lorin Mercy. Schedule Tylenol 500mg  bid for now.    Type 2 diabetes with decreased circulation -hyperglycemia likely due to steroids, A1c 7.2  - continue oral meds-Metformin 1000mg  bid and Glipizide 10mg  bid.  - off insulin. Continue to monitor CBGs      Family/ Staff Communication: observe the patient.   Goals of Care: AL  Labs/tests ordered: urine culture

## 2014-01-31 NOTE — Assessment & Plan Note (Signed)
w/ hx of bleeding ulcers, stable. Continue omeprazole and prn TUMs EX p meals prn.

## 2014-02-10 ENCOUNTER — Encounter: Payer: Self-pay | Admitting: Pulmonary Disease

## 2014-02-10 ENCOUNTER — Ambulatory Visit (INDEPENDENT_AMBULATORY_CARE_PROVIDER_SITE_OTHER): Payer: Medicare Other | Admitting: Pulmonary Disease

## 2014-02-10 VITALS — BP 115/68 | HR 70 | Temp 97.0°F | Ht 64.0 in | Wt 176.8 lb

## 2014-02-10 DIAGNOSIS — J479 Bronchiectasis, uncomplicated: Secondary | ICD-10-CM

## 2014-02-10 DIAGNOSIS — J438 Other emphysema: Secondary | ICD-10-CM

## 2014-02-10 MED ORDER — ALBUTEROL SULFATE 108 (90 BASE) MCG/ACT IN AEPB: 2.0000 | INHALATION_SPRAY | Freq: Four times a day (QID) | RESPIRATORY_TRACT | Status: DC | PRN

## 2014-02-10 NOTE — Assessment & Plan Note (Signed)
The patient hasn't had any recent acute exacerbations requiring antibiotics.

## 2014-02-10 NOTE — Patient Instructions (Signed)
Will try you on striverdi, 2 inhalations each am.  Once you have tried the samples, and if you think it has helped you, call and we can send in a prescription.  Will send in a prescription for your albuterol to use for rescue. Stay as active as possible followup with me again in 20mos

## 2014-02-10 NOTE — Progress Notes (Signed)
   Subjective:    Patient ID: [name], female    DOB: 1918-01-12, 81 y.o.   MRN: [id number]  HPI The patient comes in today for follow-up of her known bronchiectasis with COPD. She has chronic dyspnea on exertion, but has been unwilling to try a maintenance bronchodilator over time. She has been using albuterol only as needed, and currently is out of her medication. She has no issues with her breathing at rest, but will quickly get winded with any type of activity. She has not had any significant chest congestion or cough. She has not had any purulence.   Review of Systems  Constitutional: Negative for fever and unexpected weight change.  HENT: Positive for postnasal drip. Negative for congestion, dental problem, ear pain, nosebleeds, rhinorrhea, sinus pressure, sneezing, sore throat and trouble swallowing.   Eyes: Negative for redness and itching.  Respiratory: Positive for shortness of breath and wheezing. Negative for cough and chest tightness.   Cardiovascular: Positive for leg swelling. Negative for palpitations.  Gastrointestinal: Negative for nausea and vomiting.  Genitourinary: Negative for dysuria.  Musculoskeletal: Negative for joint swelling.  Skin: Negative for rash.  Neurological: Negative for headaches.  Hematological: Does not bruise/bleed easily.  Psychiatric/Behavioral: Negative for dysphoric mood. The patient is not nervous/anxious.        Objective:   Physical Exam Obese female in no acute distress Nose without purulence or discharge noted Neck without lymphadenopathy or thyromegaly Chest with mildly decreased breath sounds, no active wheezing Cardiac exam with regular rate and rhythm Lower extremities with 1+ edema, no cyanosis Alert and oriented, moves all 4 extremities.       Assessment & Plan:

## 2014-02-10 NOTE — Assessment & Plan Note (Signed)
The patient continues to have significant dyspnea on exertion with any type of activity, it has always been hesitant to start on a maintenance bronchodilator regimen. She currently is also out of her rescue inhaler. After a long conversation, she is willing to try a long-acting beta agonist, and will try her on this for the next 3-4 weeks. I also stressed to her the importance of having a rescue inhaler available at all times in the event she has issues.

## 2014-03-07 ENCOUNTER — Non-Acute Institutional Stay: Payer: Medicare Other | Admitting: Internal Medicine

## 2014-03-07 VITALS — BP 122/62 | HR 71 | Temp 97.4°F | Resp 18 | Wt 183.0 lb

## 2014-03-07 DIAGNOSIS — J9601 Acute respiratory failure with hypoxia: Secondary | ICD-10-CM

## 2014-03-07 DIAGNOSIS — M25561 Pain in right knee: Secondary | ICD-10-CM

## 2014-03-07 DIAGNOSIS — I10 Essential (primary) hypertension: Secondary | ICD-10-CM

## 2014-03-07 DIAGNOSIS — R609 Edema, unspecified: Secondary | ICD-10-CM

## 2014-03-07 DIAGNOSIS — J438 Other emphysema: Secondary | ICD-10-CM

## 2014-03-07 DIAGNOSIS — E1159 Type 2 diabetes mellitus with other circulatory complications: Secondary | ICD-10-CM

## 2014-03-07 DIAGNOSIS — D509 Iron deficiency anemia, unspecified: Secondary | ICD-10-CM

## 2014-03-07 DIAGNOSIS — D649 Anemia, unspecified: Secondary | ICD-10-CM

## 2014-03-07 DIAGNOSIS — E1151 Type 2 diabetes mellitus with diabetic peripheral angiopathy without gangrene: Secondary | ICD-10-CM

## 2014-03-07 NOTE — Progress Notes (Signed)
Patient ID: [name], female   DOB: May 22, 1917, 81 y.o.   MRN: [id number]    HISTORY AND PHYSICAL  Location:    [facility], room AL 8   Place of Service:   CLINIC  Extended Emergency Contact Information Primary Emergency Contact: Taplin,[name] Address: [address]          [place], [place] [address] [place] Phone: [phone] Mobile Phone: [phone] Relation: Son Secondary Emergency Contact: Cheron Schaumann Address: [address]          [place], [place] [address] [place] Phone: [phone] Mobile Phone: [phone] Relation: Friend  Advanced Directive information Does patient have an advance directive?: Yes, Type of Advance Directive: Out of facility DNR (pink MOST or yellow form), Pre-existing out of facility DNR order (yellow form or pink MOST form): Yellow form placed in chart (order not valid for inpatient use), Does patient want to make changes to advanced directive?: No - Patient declined  Chief Complaint  Patient presents with  . Medical Management of Chronic Issues    readmission following hospitalization    HPI:  Patient was hospitalized 11/12/13 through 11/17/13 for COPD exacerbation and septicemia. She was initially admitted to the skilled nursing facility area. She was on oxygen at the time of readmission from the hospital. She was stabilized, improved in self-sufficiency, and ultimately moved to the assisted living area in December 2015.   Despite respiratory issues encountered during her hospitalization and the fact that she was returned to the facility on oxygen and appeared to needed, her insurance has refused to pay for the oxygen.  She continues to have increased edema. She is breathing easier and oxygen has been discontinued. She is unstable with walking and uses a 3 wheel walker. She is using hydralazine to help control blood pressure.  She has generalized aches and pains. These do not seem to interfere with usual  activities of daily living.  Urinary tract infection with Klebsiella pneumonia was diagnosed 01/30/14 and treated with Septra. She continues to complain of urinary frequency despite clearing of this infection.  Blood pressure has been under good control and was as low as 122/62 today. She is on several drugs to bring the blood pressure down. Her insurance has notified us that will not approve further bisoprolol.  Past Medical History  Diagnosis Date  . Type II or unspecified type diabetes mellitus without mention of complication, not stated as uncontrolled   . Hypertension   . Arthritis   . Ulcer   . COPD (chronic obstructive pulmonary disease)   . Regional enteritis of unspecified site   . Other malaise and fatigue   . Anemia, unspecified   . Other and unspecified hyperlipidemia   . Hypoxemia   . Pneumonia, organism unspecified   . Mucopurulent chronic bronchitis   . Palpitations   . Nontoxic uninodular goiter   . Abnormality of gait   . Fall from other slipping, tripping, or stumbling   . Cholelithiases   . Closed fracture of unspecified part of ramus of mandible   . Disturbance of skin sensation   . Sciatica   . Tension headache   . Coronary atherosclerosis of unspecified type of vessel, native or graft   . Osteoarthrosis, unspecified whether generalized or localized, unspecified site   . Insomnia, unspecified   . Edema   . Tension headache   . Pain in joint, lower leg 2010    right knee  . Unspecified venous (peripheral) insufficiency   .  Esophageal reflux   . Rosacea   . Cataract 1990    Dr. Katy Fitch  . Pain in joint, shoulder region 2013    left  . Gastric ulcer with hemorrhage 2008  . Diabetes mellitus type 2 in nonobese     Past Surgical History  Procedure Laterality Date  . Total knee arthroplasty Bilateral 1993, 1998    knees  . Other surgical history  2008    Ulcer surgery   . Small intestine surgery    . Eye surgery Bilateral 1990    Dr. Katy Fitch  .  Gastrojejunostomy  05/2004    secondary to ulcer  . Joint replacement Bilateral 1993-1998    total knee replacement    Patient Care Team: Estill Dooms, MD as PCP - General (Internal Medicine) Clent Jacks, MD (Ophthalmology) Laverda Page, MD as Attending Physician (Cardiology) [facility] [name], MD as Consulting Physician (Orthopedic Surgery) Kathee Delton, MD as Consulting Physician (Pulmonary Disease)  History   Social History  . Marital Status: Widowed    Spouse Name: N/A    Number of Children: N/A  . Years of Education: N/A   Occupational History  . retired Agricultural engineer.     Social History Main Topics  . Smoking status: Former Smoker -- 0.30 packs/day for 10 years    Types: Cigarettes    Quit date: 02/03/1973  . Smokeless tobacco: Never Used     Comment: former casual smoker, lots of second hand smoke exposure  . Alcohol Use: No  . Drug Use: No  . Sexual Activity: No   Other Topics Concern  . Not on file   Social History Narrative   Pt lives at [facility] in the independent living section since 1994.   Pt lives alone.   Has a living will   POA   Power scooter and rolling walker   Exercise none                     reports that she quit smoking about 41 years ago. Her smoking use included Cigarettes. She has a 3 pack-year smoking history. She has never used smokeless tobacco. She reports that she does not drink alcohol or use illicit drugs.  Family History  Problem Relation Age of Onset  . Heart disease Father   . Colon cancer Sister   . Diabetes Brother   . Obesity Brother   . Mental retardation Daughter   . Obesity Daughter    Family Status  Relation Status Death Age  . Father Deceased 68    CAD, Stomach ulcers  . Sister Deceased     cervical and colon cancer  . Brother Alive   . Daughter Alive     Mental retardation, encephalitis  . Mother Deceased 80    History of TIA'S  . Son Alive     Immunization History    Administered Date(s) Administered  . Influenza Split 11/06/2011, 11/04/2012  . Influenza,inj,Quad PF,36+ Mos 11/17/2013  . Pneumococcal Polysaccharide-23 02/04/2003  . Tdap 02/27/2013  . Zoster 02/03/2006    Allergies  Allergen Reactions  . Adhesive [Tape]     unknown  . Aspirin     unknown  . Codeine     unknown  . Enalapril Cough  . Hydrocodone     unknown  . Pantoprazole Sodium     unknown    Medications: Patient's Medications  New Prescriptions   No medications on file  Previous  Medications   ACETAMINOPHEN (TYLENOL) 325 MG TABLET    Take 650 mg by mouth every 6 (six) hours as needed for moderate pain (pain).   ALBUTEROL SULFATE (PROAIR RESPICLICK) 578 (90 BASE) MCG/ACT AEPB    Inhale 2 puffs into the lungs every 6 (six) hours as needed.   AMLODIPINE (NORVASC) 5 MG TABLET    Take 10 mg by mouth daily.    ATORVASTATIN (LIPITOR) 10 MG TABLET    Take 10 mg by mouth daily.   BISOPROLOL (ZEBETA) 10 MG TABLET    Take 1 tablet (10 mg total) by mouth daily.   CALCIUM CARBONATE (TUMS EX) 750 MG CHEWABLE TABLET    Chew 2 tablets by mouth 3 (three) times daily as needed for heartburn (after meals if needed for heartburn).    DICLOFENAC SODIUM (VOLTAREN) 1 % GEL    Apply 1 g topically 4 (four) times daily as needed (knees).    DOXAZOSIN (CARDURA) 8 MG TABLET    Take 8 mg by mouth daily. To control BP.   DOXEPIN (SINEQUAN) 10 MG CAPSULE    Take 10 mg by mouth at bedtime as needed (for rest).    FUROSEMIDE (LASIX) 40 MG TABLET    Take 40 mg by mouth daily. Take one daily   GLIPIZIDE (GLUCOTROL) 10 MG TABLET    Take 10 mg by mouth 2 (two) times daily.   GLUCOSE BLOOD TEST STRIP    1 each by Other route daily. Use once daily to test blood sugar.   HYDRALAZINE (APRESOLINE) 25 MG TABLET    Take 1 tablet (25 mg total) by mouth 3 (three) times daily.   HYDROCORTISONE CREAM 1 %    Apply topically 2 (two) times daily. Apply to right upper arm/bicep area bid x one week   LOSARTAN (COZAAR) 100  MG TABLET    Take 100 mg by mouth daily. Take one tablet daily for blood pressure   METFORMIN (GLUCOPHAGE) 500 MG TABLET    Take 1,000 mg by mouth 2 (two) times daily with a meal.   MULTIPLE VITAMIN (MULTIVITAMIN) CAPSULE    Take 1 capsule by mouth daily.   OMEPRAZOLE (PRILOSEC) 20 MG CAPSULE       PREDNISONE (DELTASONE) 10 MG TABLET    Take 6 tablets (60 mg total) by mouth daily with breakfast. On 11/18/13 and decrease by 10mg  daily   SIMETHICONE (GAS RELIEF 80 PO)    Take 1 tablet by mouth daily.    SIMVASTATIN (ZOCOR) 20 MG TABLET    Take 20 mg by mouth daily.  Modified Medications   No medications on file  Discontinued Medications   OMEPRAZOLE (PRILOSEC) 40 MG CAPSULE    Take 40 mg by mouth daily.    Review of Systems  Constitutional: Negative for fever, chills and diaphoresis.       Baseline mobility is motorized scooter  HENT: Positive for hearing loss. Negative for congestion, ear discharge, ear pain, nosebleeds, sore throat and tinnitus.   Eyes: Negative for photophobia and pain.  Respiratory: Positive for cough and shortness of breath. Negative for wheezing and stridor.   Cardiovascular: Positive for leg swelling. Negative for chest pain and palpitations.       Trace. Right knee pain.   Gastrointestinal: Negative for nausea, vomiting, abdominal pain, diarrhea, constipation and blood in stool.  Endocrine: Negative for polydipsia.  Genitourinary: Positive for frequency. Negative for dysuria, urgency, hematuria and flank pain.       No better since ABT  Musculoskeletal: Negative for myalgias, back pain and neck pain.       R knee pain  Skin: Negative for rash.  Allergic/Immunologic: Negative for environmental allergies.  Neurological: Positive for weakness. Negative for dizziness, tremors, seizures and headaches.  Hematological: Does not bruise/bleed easily.  Psychiatric/Behavioral: Negative for suicidal ideas and hallucinations. The patient is not nervous/anxious.     Filed  Vitals:   03/07/14 1116  BP: 122/62  Pulse: 71  Temp: 97.4 F (36.3 C)  Resp: 18  Weight: 183 lb (83.008 kg)  SpO2: 96%   Body mass index is 31.4 kg/(m^2).  Physical Exam  Constitutional: She is oriented to person, place, and time. She appears well-developed and well-nourished. No distress.  HENT:  Head: Normocephalic and atraumatic.  Right Ear: External ear normal.  Left Ear: External ear normal.  Nose: Nose normal.  Eyes: Conjunctivae and EOM are normal. Pupils are equal, round, and reactive to light.  Neck: Neck supple. No JVD present. No tracheal deviation present. No thyromegaly present.  Cardiovascular: Normal rate, regular rhythm, normal heart sounds and intact distal pulses.  Exam reveals no gallop and no friction rub.   No murmur heard. Pulmonary/Chest: No respiratory distress. She has no wheezes. She has rales.  Bibasilar.   Abdominal: Bowel sounds are normal. She exhibits no distension and no mass. There is no tenderness.  Musculoskeletal: She exhibits edema (2-3+ bipedal) and tenderness.  Scars from bilateral TKR. Right knee seems to be loose when stressing the joint. No effusion. Instable gait. Driving a power scooter today. Trace edema BLE Chronic R knee pain  Lymphadenopathy:    She has no cervical adenopathy.  Neurological: She is alert and oriented to person, place, and time. She has normal reflexes. No cranial nerve deficit. Coordination normal.  Diminished vibratory and monofilament testing in both feet.  Skin: Rash (Reddened rash on both legs. Some itching and burning associated with the rash.) noted. No erythema. No pallor.  Prior exam: 6 mm nodule of the left posterior neck at the SCM muscle. Not tender.  Psychiatric: She has a normal mood and affect. Her behavior is normal. Judgment and thought content normal.     Labs reviewed: No visits with results within 3 Month(s) from this visit. Latest known visit with results is:  Admission on 11/12/2013,  Discharged on 11/17/2013  No results displayed because visit has over 200 results.      Dg Chest Port 1 View  (if Code Sepsis Called)  11/12/2013   CLINICAL DATA:  One day history of chest pain.  Cough  EXAM: PORTABLE CHEST - 1 VIEW  COMPARISON:  February 27, 2013  FINDINGS: There is focal airspace consolidation in the periphery of the left upper lobe. There is underlying emphysema. Elsewhere the lungs appear clear. Heart is upper normal in size with pulmonary vascularity within normal limits. No adenopathy. There is arthropathy in the thoracic spine in both shoulders.  IMPRESSION: Left upper lobe airspace consolidation. This opacity appears somewhat nodular. While it most likely represents pneumonia, followup images to clearing advised to exclude the possibility of underlying neoplasm in this area.   Electronically Signed   By: Lowella Grip M.D.   On: 11/12/2013 09:03     Assessment/Plan  1. Other emphysema Stable. Chronic dyspnea.  2. Essential hypertension Controlled. Discontinue hydralazine. Discontinue bisoprolol. Start metoprolol tartrate 25 mg twice daily. Remain on amlodipine 10 mg daily, doxazosin 8 mg daily, and losartan 100 mg daily  3. Type 2 diabetes with decreased  circulation Controlled  4. Anemia, unspecified anemia type Hemoglobin 11.6 on 11/17/13. Needs further follow-up.  5. Iron deficiency anemia Needs further follow-up  6. Edema Unchanged  7. Pain in joint, lower leg, right Chronic right knee pain  8. Acute respiratory failure with hypoxia Resolved. Now off oxygen. Patient was received from the hospital 12/06/13 on oxygen. She needed the oxygen while hospitalized due to hypoxia. She was transported via ambulance to her skilled nursing facility on oxygen. Oxygen was continued at facility until such time as she was able to be weaned off the oxygen. Use of the oxygen was necessary due to dyspnea and a low O2 saturation off oxygen.

## 2014-03-09 LAB — CBC AND DIFFERENTIAL
HCT: 34 % — AB (ref 36–46)
Hemoglobin: 11.4 g/dL — AB (ref 12.0–16.0)
PLATELETS: 215 10*3/uL (ref 150–399)
WBC: 8.2 10^3/mL

## 2014-03-09 LAB — BASIC METABOLIC PANEL
BUN: 21 mg/dL (ref 4–21)
CREATININE: 0.7 mg/dL (ref 0.5–1.1)
Glucose: 64 mg/dL
POTASSIUM: 4 mmol/L (ref 3.4–5.3)
Sodium: 137 mmol/L (ref 137–147)

## 2014-03-09 LAB — TSH: TSH: 7.6 u[IU]/mL — AB (ref 0.41–5.90)

## 2014-03-09 LAB — HEMOGLOBIN A1C: Hgb A1c MFr Bld: 7.6 % — AB (ref 4.0–6.0)

## 2014-03-09 MED ORDER — METOPROLOL TARTRATE 25 MG PO TABS: ORAL_TABLET | ORAL | Status: DC

## 2014-03-10 ENCOUNTER — Encounter: Payer: Self-pay | Admitting: Internal Medicine

## 2014-03-17 ENCOUNTER — Other Ambulatory Visit: Payer: Self-pay | Admitting: Internal Medicine

## 2014-04-04 ENCOUNTER — Non-Acute Institutional Stay: Payer: Medicare Other | Admitting: Nurse Practitioner

## 2014-04-04 DIAGNOSIS — E1151 Type 2 diabetes mellitus with diabetic peripheral angiopathy without gangrene: Secondary | ICD-10-CM

## 2014-04-04 DIAGNOSIS — R609 Edema, unspecified: Secondary | ICD-10-CM | POA: Diagnosis not present

## 2014-04-04 DIAGNOSIS — E1159 Type 2 diabetes mellitus with other circulatory complications: Secondary | ICD-10-CM

## 2014-04-04 DIAGNOSIS — D638 Anemia in other chronic diseases classified elsewhere: Secondary | ICD-10-CM | POA: Diagnosis not present

## 2014-04-04 DIAGNOSIS — J439 Emphysema, unspecified: Secondary | ICD-10-CM

## 2014-04-04 DIAGNOSIS — I10 Essential (primary) hypertension: Secondary | ICD-10-CM | POA: Diagnosis not present

## 2014-04-04 DIAGNOSIS — J441 Chronic obstructive pulmonary disease with (acute) exacerbation: Secondary | ICD-10-CM

## 2014-04-04 DIAGNOSIS — M25569 Pain in unspecified knee: Secondary | ICD-10-CM

## 2014-04-04 DIAGNOSIS — K219 Gastro-esophageal reflux disease without esophagitis: Secondary | ICD-10-CM

## 2014-04-04 NOTE — Progress Notes (Signed)
Patient ID: [name], female   DOB: 03-22-1917, 81 y.o.   MRN: [id number]   Code Status: DNR  Allergies  Allergen Reactions  . Adhesive [Tape]     unknown  . Aspirin     unknown  . Codeine     unknown  . Enalapril Cough  . Hydrocodone     unknown  . Pantoprazole Sodium     unknown    Chief Complaint  Patient presents with  . Medical Management of Chronic Issues  . Acute Visit    PNA    HPI: Patient is a 81 y.o. female seen in the AL at [facility] today for evaluation of PNA  and chronic medical conditions.     Hospitalized 11/12/2013 -11/17/2013 for COPD exacerbation and septicemia due to Gram Neg bacteremia.   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  - off insulin. Continue to monitor CBGs        Pain in joint, lower leg    Chronic right knee pain. Has had TKR 1998. Followed by Dr. Lorin Mercy. Schedule Tylenol 500mg  bid for now.         Hypertension (Chronic)    - Continue norvasc 5mg  daily, Metoprolol 50mg  bid, Losartan 100mg ,  doxazosin 8mg , and prn Clonidine 0.1mg  fro Bp>160/100 - On lasix 40 mg daily  - Continue statin  -Bp controlled.       Esophageal reflux    Stable, continue Omeprazole 20mg .       Edema (Chronic)    Trace edema BLE-chronic and no significant change since last seen.       COPD with acute exacerbation - Primary    Coughing and wheezing. 04/04/14 CXR identified left basilar pneumonia. Levaquin 500mg  po daily x 10days and Florastor bid x 10 days. DuoNeb q6h x 5 days.       COPD (chronic obstructive pulmonary disease) (Chronic)    - presently Prednisone 10 mg daily maintenance dose.  - Continue lasix 40mg  - ECHO--EF 73-53%, grade 2 diastolic dysfunction         Anemia of chronic disease    ikely secondary to marrow suppressions from acute illness  or side effect of zosyn, stable around 10.8mg /dl            Review of Systems:  Review of Systems  Constitutional: Positive for malaise/fatigue. Negative for fever, chills, weight loss and diaphoresis.  HENT: Positive for hearing loss. Negative for congestion, ear discharge, ear pain, nosebleeds, sore throat and tinnitus.   Eyes: Negative for blurred vision, double vision, photophobia, pain, discharge and redness.  Respiratory: Positive for cough, sputum production, shortness of breath and wheezing. Negative for hemoptysis and stridor.   Cardiovascular: Positive for leg swelling. Negative for chest pain, palpitations, orthopnea, claudication and PND.       Trace in BLE  Gastrointestinal: Negative for heartburn, nausea, vomiting, abdominal pain, diarrhea, constipation, blood in stool and melena.  Genitourinary: Positive for frequency. Negative for dysuria, urgency, hematuria and flank pain.  Musculoskeletal: Positive for joint pain. Negative for myalgias, back pain, falls and neck pain.  Skin: Negative for itching and rash.  Neurological: Negative for dizziness, tingling, tremors, sensory change, speech change, focal weakness, seizures, loss of consciousness, weakness and headaches.  Endo/Heme/Allergies: Negative for environmental allergies and polydipsia. Does not bruise/bleed easily.  Psychiatric/Behavioral: Positive for memory loss. Negative for depression, hallucinations and substance abuse. The patient has insomnia. The patient is not nervous/anxious.    (type .ros)  Past Medical History  Diagnosis Date  . Type II or unspecified type diabetes mellitus without mention of complication, not stated as uncontrolled   . Hypertension   . Arthritis   . Ulcer   . COPD (chronic obstructive pulmonary disease)   . Regional enteritis of unspecified site   . Other malaise and fatigue   . Anemia, unspecified   . Other and unspecified hyperlipidemia   . Hypoxemia   . Pneumonia, organism  unspecified   . Mucopurulent chronic bronchitis   . Palpitations   . Nontoxic uninodular goiter   . Abnormality of gait   . Fall from other slipping, tripping, or stumbling   . Cholelithiases   . Closed fracture of unspecified part of ramus of mandible   . Disturbance of skin sensation   . Sciatica   . Tension headache   . Coronary atherosclerosis of unspecified type of vessel, native or graft   . Osteoarthrosis, unspecified whether generalized or localized, unspecified site   . Insomnia, unspecified   . Edema   . Tension headache   . Pain in joint, lower leg 2010    right knee  . Unspecified venous (peripheral) insufficiency   . Esophageal reflux   . Rosacea   . Cataract 1990    Dr. Katy Fitch  . Pain in joint, shoulder region 2013    left  . Gastric ulcer with hemorrhage 2008  . Diabetes mellitus type 2 in nonobese    Past Surgical History  Procedure Laterality Date  . Total knee arthroplasty Bilateral 1993, 1998    knees  . Other surgical history  2008    Ulcer surgery   . Small intestine surgery    . Eye surgery Bilateral 1990    Dr. Katy Fitch  . Gastrojejunostomy  05/2004    secondary to ulcer  . Joint replacement Bilateral [phone]    total knee replacement   Social History:   reports that she quit smoking about 41 years ago. Her smoking use included Cigarettes. She has a 3 pack-year smoking history. She has never used smokeless tobacco. She reports that she does not drink alcohol or use illicit drugs.  Family History  Problem Relation Age of Onset  . Heart disease Father   . Colon cancer Sister   . Diabetes Brother   . Obesity Brother   . Mental retardation Daughter   . Obesity Daughter     Medications: Patient's Medications  New Prescriptions   No medications on file  Previous Medications   ACETAMINOPHEN (TYLENOL) 325 MG TABLET    Take 650 mg by mouth every 6 (six) hours as needed for moderate pain (pain).   ALBUTEROL SULFATE (PROAIR RESPICLICK) 416 (90  BASE) MCG/ACT AEPB    Inhale 2 puffs into the lungs every 6 (six) hours as needed.   AMLODIPINE (NORVASC) 5 MG TABLET    Take 10 mg by mouth daily.    ATORVASTATIN (LIPITOR) 10 MG TABLET    Take 10 mg by mouth daily.   CALCIUM CARBONATE (TUMS EX) 750 MG CHEWABLE TABLET    Chew 2 tablets by mouth 3 (three) times daily as needed for heartburn (after meals if needed for heartburn).    DICLOFENAC SODIUM (VOLTAREN) 1 % GEL    Apply 1 g topically 4 (four) times daily as needed (knees).  DOXAZOSIN (CARDURA) 8 MG TABLET    Take 8 mg by mouth daily. To control BP.   DOXEPIN (SINEQUAN) 10 MG CAPSULE    Take 10 mg by mouth at bedtime as needed (for rest).    FUROSEMIDE (LASIX) 40 MG TABLET    Take 40 mg by mouth daily. Take one daily   GLIPIZIDE (GLUCOTROL) 10 MG TABLET    Take 10 mg by mouth 2 (two) times daily.   GLUCOSE BLOOD TEST STRIP    1 each by Other route daily. Use once daily to test blood sugar.   HYDRALAZINE (APRESOLINE) 25 MG TABLET    Take 1 tablet (25 mg total) by mouth 3 (three) times daily.   HYDROCORTISONE CREAM 1 %    Apply topically 2 (two) times daily. Apply to right upper arm/bicep area bid x one week   LOSARTAN (COZAAR) 100 MG TABLET    Take 100 mg by mouth daily. Take one tablet daily for blood pressure   METFORMIN (GLUCOPHAGE) 500 MG TABLET    Take 1,000 mg by mouth 2 (two) times daily with a meal.   METFORMIN (GLUCOPHAGE) 500 MG TABLET    TAKE 2 TABLETS EVERY MORNING AND 2 TABLETS AT SUPPER TO CONTROL BLOOD SUGAR   METOPROLOL TARTRATE (LOPRESSOR) 25 MG TABLET    One twice daily to control blood pressure   MULTIPLE VITAMIN (MULTIVITAMIN) CAPSULE    Take 1 capsule by mouth daily.   OMEPRAZOLE (PRILOSEC) 20 MG CAPSULE       PREDNISONE (DELTASONE) 10 MG TABLET    Take 6 tablets (60 mg total) by mouth daily with breakfast. On 11/18/13 and decrease by 10mg  daily   SIMETHICONE (GAS RELIEF 80 PO)    Take 1 tablet by mouth daily.    SIMVASTATIN (ZOCOR) 20 MG TABLET    Take 20 mg by mouth  daily.  Modified Medications   No medications on file  Discontinued Medications   No medications on file     Physical Exam: Physical Exam  Constitutional: She is oriented to person, place, and time. She appears well-developed and well-nourished. No distress.  HENT:  Head: Normocephalic and atraumatic.  Right Ear: External ear normal.  Left Ear: External ear normal.  Nose: Nose normal.  Eyes: Conjunctivae and EOM are normal. Pupils are equal, round, and reactive to light.  Neck: Neck supple. No JVD present. No tracheal deviation present. No thyromegaly present.  Cardiovascular: Normal rate, regular rhythm, normal heart sounds and intact distal pulses.  Exam reveals no gallop and no friction rub.   No murmur heard. Pulmonary/Chest: No respiratory distress. She has wheezes. She has rales.  Abdominal: Bowel sounds are normal. She exhibits no distension and no mass. There is no tenderness.  Musculoskeletal: She exhibits edema (2-3+ bipedal) and tenderness.  Scars from bilateral TKR. Right knee seems to be loose when stressing the joint. No effusion. Instable gait. Driving a power scooter today. Trace edema BLE Chronic R knee pain  Lymphadenopathy:    She has no cervical adenopathy.  Neurological: She is alert and oriented to person, place, and time. She has normal reflexes. No cranial nerve deficit. Coordination normal.  Diminished vibratory and monofilament testing in both feet.  Skin: No rash (Reddened rash on both legs. Some itching and burning associated with the rash.) noted. No erythema. No pallor.  Prior exam: 6 mm nodule of the left posterior neck at the SCM muscle. Not tender.  Psychiatric: She has a normal mood and affect. Her  behavior is normal. Judgment and thought content normal.    There were no vitals filed for this visit.    Labs reviewed: Basic Metabolic Panel:  Recent Labs  05/19/13  10/27/13  11/15/13 0458 11/16/13 0432 11/17/13 0439  NA 135*  < > 139  <  > 138 141 140  K 4.1  < > 4.5  < > 3.5* 3.3* 3.2*  CL  --   --   --   < > 100 100 98  CO2  --   --   --   < > 26 29 33*  GLUCOSE  --   --   --   < > 249* 161* 126*  BUN 18  < > 15  < > 19 21 18   CREATININE 0.6  < > 0.6  < > 0.62 0.67 0.59  CALCIUM  --   --   --   < > 8.3* 8.3* 8.2*  TSH 2.43  --  2.42  --   --   --   --   < > = values in this interval not displayed. Liver Function Tests:  Recent Labs  08/25/13 10/27/13 11/12/13 0840  AST 15 17 15   ALT 11 13 11   ALKPHOS 90 124 85  BILITOT  --   --  0.5  PROT  --   --  6.8  ALBUMIN  --   --  3.5   No results for input(s): LIPASE, AMYLASE in the last 8760 hours. No results for input(s): AMMONIA in the last 8760 hours. CBC:  Recent Labs  08/25/13 11/12/13 0840  11/15/13 0458 11/16/13 0432 11/17/13 0439  WBC 7.3 22.1*  < > 14.2* 14.1* 13.3*  NEUTROABS 5 20.1*  --   --   --   --   HGB 13.3 12.1  < > 10.8* 11.5* 11.6*  HCT 40 37.0  < > 32.7* 35.2* 35.4*  MCV  --  95.4  < > 92.6 94.6 94.1  PLT 267 179  < > 208 207 222  < > = values in this interval not displayed. Lipid Panel:  Recent Labs  08/25/13 10/27/13  CHOL 123 108  HDL 54 54  LDLCALC 26 33  TRIG 215* 103    Past Procedures:  11/14/13 CXR  IMPRESSION: Persistent left mid lung consolidation, consistent with pneumonia. Increasing, diffuse bilateral interstitial opacities could reflect interstitial pneumonitis or edema.    Assessment/Plan COPD with acute exacerbation Coughing and wheezing. 04/04/14 CXR identified left basilar pneumonia. Levaquin 500mg  po daily x 10days and Florastor bid x 10 days. DuoNeb q6h x 5 days.    Type 2 diabetes with decreased circulation - hyperglycemia likely due to steroids, A1c 7.2  - continue oral meds-Metformin 1000mg  bid and Glipizide 10mg  bid.  - off insulin. Continue to monitor CBGs     Esophageal reflux Stable, continue Omeprazole 20mg .    COPD (chronic obstructive pulmonary disease) - presently Prednisone 10  mg daily maintenance dose.  - Continue lasix 40mg  - ECHO--EF 50-27%, grade 2 diastolic dysfunction      Hypertension - Continue norvasc 5mg  daily, Metoprolol 50mg  bid, Losartan 100mg ,  doxazosin 8mg , and prn Clonidine 0.1mg  fro Bp>160/100 - On lasix 40 mg daily  - Continue statin  -Bp controlled.    Edema Trace edema BLE-chronic and no significant change since last seen.    Anemia of chronic disease ikely secondary to marrow suppressions from acute illness or side effect of zosyn, stable around 10.8mg /dl  Insomnia, unspecified Stable, continue Doxepin 10mg  qhs      Pain in joint, lower leg Chronic right knee pain. Has had TKR 1998. Followed by Dr. Lorin Mercy. Schedule Tylenol 500mg  bid for now.        Family/ Staff Communication: observe the patient.   Goals of Care: AL  Labs/tests ordered: CXR 04/04/14

## 2014-04-04 NOTE — Assessment & Plan Note (Signed)
Stable, continue Doxepin 10mg  qhs

## 2014-04-04 NOTE — Assessment & Plan Note (Signed)
Chronic right knee pain. Has had TKR 1998. Followed by Dr. Lorin Mercy. Schedule Tylenol 500mg  bid for now.

## 2014-04-04 NOTE — Assessment & Plan Note (Signed)
Coughing and wheezing. 04/04/14 CXR identified left basilar pneumonia. Levaquin 500mg  po daily x 10days and Florastor bid x 10 days. DuoNeb q6h x 5 days.

## 2014-04-04 NOTE — Assessment & Plan Note (Signed)
Stable, continue Omeprazole 20mg .

## 2014-04-04 NOTE — Assessment & Plan Note (Signed)
-   presently Prednisone 10 mg daily maintenance dose.  - Continue lasix 40mg  - ECHO--EF 78-93%, grade 2 diastolic dysfunction

## 2014-04-04 NOTE — Assessment & Plan Note (Signed)
-   hyperglycemia likely due to steroids, A1c 7.2  - continue oral meds-Metformin 1000mg  bid and Glipizide 10mg  bid.  - off insulin. Continue to monitor CBGs

## 2014-04-04 NOTE — Assessment & Plan Note (Addendum)
-   Continue norvasc 5mg  daily, Metoprolol 50mg  bid, Losartan 100mg ,  doxazosin 8mg , and prn Clonidine 0.1mg  fro Bp>160/100 - On lasix 40 mg daily  - Continue statin  -Bp controlled.

## 2014-04-04 NOTE — Assessment & Plan Note (Signed)
Trace edema BLE-chronic and no significant change since last seen.

## 2014-04-04 NOTE — Assessment & Plan Note (Signed)
ikely secondary to marrow suppressions from acute illness or side effect of zosyn, stable around 10.8mg /dl

## 2014-05-11 HISTORY — PX: MOLE REMOVAL: SHX2046

## 2014-05-16 ENCOUNTER — Non-Acute Institutional Stay: Payer: Medicare Other | Admitting: Nurse Practitioner

## 2014-05-16 ENCOUNTER — Encounter: Payer: Self-pay | Admitting: Nurse Practitioner

## 2014-05-16 DIAGNOSIS — N393 Stress incontinence (female) (male): Secondary | ICD-10-CM

## 2014-05-16 DIAGNOSIS — K219 Gastro-esophageal reflux disease without esophagitis: Secondary | ICD-10-CM

## 2014-05-16 DIAGNOSIS — I10 Essential (primary) hypertension: Secondary | ICD-10-CM

## 2014-05-16 DIAGNOSIS — E1151 Type 2 diabetes mellitus with diabetic peripheral angiopathy without gangrene: Secondary | ICD-10-CM

## 2014-05-16 DIAGNOSIS — E1159 Type 2 diabetes mellitus with other circulatory complications: Secondary | ICD-10-CM

## 2014-05-16 NOTE — Assessment & Plan Note (Signed)
Not new, denied dysuria, suprapubic or CVA tenderness. 2-3x/night. Will try Myrbetriq 25mg  daily. May refer to Urology.

## 2014-05-16 NOTE — Progress Notes (Signed)
Patient ID: [name], female   DOB: 1917-07-23, 81 y.o.   MRN: [id number]    Code Status: DNR  Allergies  Allergen Reactions  . Adhesive [Tape]     unknown  . Aspirin     unknown  . Codeine     unknown  . Enalapril Cough  . Hydrocodone     unknown  . Pantoprazole Sodium     unknown    Chief Complaint  Patient presents with  . Medical Management of Chronic Issues  . Acute Visit    urinary incontinentce.     HPI: Patient is a 81 y.o. female seen in the AL at [facility] today for  evaluation of urinary incontinence and other chronic medical conditions Problem List Items Addressed This Visit    Esophageal reflux    w/ hx of bleeding ulcers, stable. Continue omeprazole and prn TUMs EX p meals prn.         Hypertension (Chronic)    - Continue norvasc 5mg  daily, Metoprolol 50mg  bid, Losartan 100mg ,  doxazosin 8mg , and prn Clonidine 0.1mg  fro Bp>160/100 - On lasix 40 mg daily  - Continue statin  - Bp controlled.        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  - off insulin. Continue to monitor CBGs        Urine, incontinence, stress female - Primary (Chronic)    Not new, denied dysuria, suprapubic or CVA tenderness. 2-3x/night. Will try Myrbetriq 25mg  daily. May refer to Urology.          Review of Systems:  Review of Systems  Constitutional: Negative for fever, chills and diaphoresis.  HENT: Positive for hearing loss. Negative for congestion, ear discharge, ear pain, nosebleeds, sore throat and tinnitus.   Eyes: Negative for photophobia, pain, discharge and redness.  Respiratory: Positive for cough and shortness of breath. Negative for wheezing and stridor.   Cardiovascular: Positive for leg swelling. Negative for chest pain and palpitations.       Trace in BLE  Gastrointestinal: Negative for nausea, vomiting,  abdominal pain, diarrhea, constipation and blood in stool.  Endocrine: Negative for polydipsia.  Genitourinary: Positive for frequency. Negative for dysuria, urgency, hematuria and flank pain.       Leaking urine when stands up  Musculoskeletal: Negative for myalgias, back pain and neck pain.  Skin: Negative for rash.  Allergic/Immunologic: Negative for environmental allergies.  Neurological: Negative for dizziness, tremors, seizures, weakness and headaches.  Hematological: Does not bruise/bleed easily.  Psychiatric/Behavioral: Negative for hallucinations. The patient is not nervous/anxious.      Past Medical History  Diagnosis Date  . Type II or unspecified type diabetes mellitus without mention of complication, not stated as uncontrolled   . Hypertension   . Arthritis   . Ulcer   . COPD (chronic obstructive pulmonary disease)   . Regional enteritis of unspecified site   . Other malaise and fatigue   . Anemia, unspecified   . Other and unspecified hyperlipidemia   . Hypoxemia   . Pneumonia, organism unspecified   . Mucopurulent chronic bronchitis   . Palpitations   . Nontoxic uninodular goiter   . Abnormality of gait   . Fall from other slipping, tripping, or stumbling   . Cholelithiases   . Closed fracture of unspecified part of ramus of mandible   . Disturbance of  skin sensation   . Sciatica   . Tension headache   . Coronary atherosclerosis of unspecified type of vessel, native or graft   . Osteoarthrosis, unspecified whether generalized or localized, unspecified site   . Insomnia, unspecified   . Edema   . Tension headache   . Pain in joint, lower leg 2010    right knee  . Unspecified venous (peripheral) insufficiency   . Esophageal reflux   . Rosacea   . Cataract 1990    Dr. Katy Fitch  . Pain in joint, shoulder region 2013    left  . Gastric ulcer with hemorrhage 2008  . Diabetes mellitus type 2 in nonobese    Past Surgical History  Procedure Laterality Date  .  Total knee arthroplasty Bilateral 1993, 1998    knees  . Other surgical history  2008    Ulcer surgery   . Small intestine surgery    . Eye surgery Bilateral 1990    Dr. Katy Fitch  . Gastrojejunostomy  05/2004    secondary to ulcer  . Joint replacement Bilateral [phone]    total knee replacement   Social History:   reports that she quit smoking about 41 years ago. Her smoking use included Cigarettes. She has a 3 pack-year smoking history. She has never used smokeless tobacco. She reports that she does not drink alcohol or use illicit drugs.  Family History  Problem Relation Age of Onset  . Heart disease Father   . Colon cancer Sister   . Diabetes Brother   . Obesity Brother   . Mental retardation Daughter   . Obesity Daughter     Medications: Patient's Medications  New Prescriptions   No medications on file  Previous Medications   ACETAMINOPHEN (TYLENOL) 325 MG TABLET    Take 650 mg by mouth every 6 (six) hours as needed for moderate pain (pain).   ALBUTEROL SULFATE (PROAIR RESPICLICK) 497 (90 BASE) MCG/ACT AEPB    Inhale 2 puffs into the lungs every 6 (six) hours as needed.   AMLODIPINE (NORVASC) 5 MG TABLET    Take 10 mg by mouth daily.    ATORVASTATIN (LIPITOR) 10 MG TABLET    Take 10 mg by mouth daily.   CALCIUM CARBONATE (TUMS EX) 750 MG CHEWABLE TABLET    Chew 2 tablets by mouth 3 (three) times daily as needed for heartburn (after meals if needed for heartburn).    DICLOFENAC SODIUM (VOLTAREN) 1 % GEL    Apply 1 g topically 4 (four) times daily as needed (knees).    DOXAZOSIN (CARDURA) 8 MG TABLET    Take 8 mg by mouth daily. To control BP.   DOXEPIN (SINEQUAN) 10 MG CAPSULE    Take 10 mg by mouth at bedtime as needed (for rest).    FUROSEMIDE (LASIX) 40 MG TABLET    Take 40 mg by mouth daily. Take one daily   GLIPIZIDE (GLUCOTROL) 10 MG TABLET    Take 10 mg by mouth 2 (two) times daily.   GLUCOSE BLOOD TEST STRIP    1 each by Other route daily. Use once daily to test  blood sugar.   HYDRALAZINE (APRESOLINE) 25 MG TABLET    Take 1 tablet (25 mg total) by mouth 3 (three) times daily.   HYDROCORTISONE CREAM 1 %    Apply topically 2 (two) times daily. Apply to right upper arm/bicep area bid x one week   LOSARTAN (COZAAR) 100 MG TABLET    Take 100 mg by  mouth daily. Take one tablet daily for blood pressure   METFORMIN (GLUCOPHAGE) 500 MG TABLET    Take 1,000 mg by mouth 2 (two) times daily with a meal.   METFORMIN (GLUCOPHAGE) 500 MG TABLET    TAKE 2 TABLETS EVERY MORNING AND 2 TABLETS AT SUPPER TO CONTROL BLOOD SUGAR   METOPROLOL TARTRATE (LOPRESSOR) 25 MG TABLET    One twice daily to control blood pressure   MULTIPLE VITAMIN (MULTIVITAMIN) CAPSULE    Take 1 capsule by mouth daily.   OMEPRAZOLE (PRILOSEC) 20 MG CAPSULE       PREDNISONE (DELTASONE) 10 MG TABLET    Take 6 tablets (60 mg total) by mouth daily with breakfast. On 11/18/13 and decrease by 10mg  daily   SIMETHICONE (GAS RELIEF 80 PO)    Take 1 tablet by mouth daily.    SIMVASTATIN (ZOCOR) 20 MG TABLET    Take 20 mg by mouth daily.  Modified Medications   No medications on file  Discontinued Medications   No medications on file     Physical Exam: Physical Exam  Constitutional: She is oriented to person, place, and time. She appears well-developed and well-nourished. No distress.  HENT:  Head: Normocephalic and atraumatic.  Right Ear: External ear normal.  Left Ear: External ear normal.  Nose: Nose normal.  Eyes: Conjunctivae and EOM are normal. Pupils are equal, round, and reactive to light.  Neck: Neck supple. No JVD present. No tracheal deviation present. No thyromegaly present.  Cardiovascular: Normal rate, regular rhythm, normal heart sounds and intact distal pulses.  Exam reveals no gallop and no friction rub.   No murmur heard. Pulmonary/Chest: No respiratory distress. She has no wheezes. She has rales.  Abdominal: Bowel sounds are normal. She exhibits no distension and no mass. There is  no tenderness.  Musculoskeletal: She exhibits edema (2-3+ bipedal) and tenderness.  Scars from bilateral TKR. Right knee seems to be loose when stressing the joint. No effusion. Instable gait. Driving a power scooter today. Trace edema BLE Chronic R knee pain  Lymphadenopathy:    She has no cervical adenopathy.  Neurological: She is alert and oriented to person, place, and time. She has normal reflexes. No cranial nerve deficit. Coordination normal.  Diminished vibratory and monofilament testing in both feet.  Skin: No rash (Reddened rash on both legs. Some itching and burning associated with the rash.) noted. No erythema. No pallor.  Prior exam: 6 mm nodule of the left posterior neck at the SCM muscle. Not tender.  Psychiatric: She has a normal mood and affect. Her behavior is normal. Judgment and thought content normal.    Filed Vitals:   05/16/14 1142  BP: 146/70  Pulse: 98  Temp: 98 F (36.7 C)  TempSrc: Tympanic  Resp: 20      Labs reviewed: Basic Metabolic Panel:  Recent Labs  05/19/13  10/27/13  11/15/13 0458 11/16/13 0432 11/17/13 0439  NA 135*  < > 139  < > 138 141 140  K 4.1  < > 4.5  < > 3.5* 3.3* 3.2*  CL  --   --   --   < > 100 100 98  CO2  --   --   --   < > 26 29 33*  GLUCOSE  --   --   --   < > 249* 161* 126*  BUN 18  < > 15  < > 19 21 18   CREATININE 0.6  < > 0.6  < >  0.62 0.67 0.59  CALCIUM  --   --   --   < > 8.3* 8.3* 8.2*  TSH 2.43  --  2.42  --   --   --   --   < > = values in this interval not displayed. Liver Function Tests:  Recent Labs  08/25/13 10/27/13 11/12/13 0840  AST 15 17 15   ALT 11 13 11   ALKPHOS 90 124 85  BILITOT  --   --  0.5  PROT  --   --  6.8  ALBUMIN  --   --  3.5   No results for input(s): LIPASE, AMYLASE in the last 8760 hours. No results for input(s): AMMONIA in the last 8760 hours. CBC:  Recent Labs  08/25/13 11/12/13 0840  11/15/13 0458 11/16/13 0432 11/17/13 0439  WBC 7.3 22.1*  < > 14.2* 14.1* 13.3*    NEUTROABS 5 20.1*  --   --   --   --   HGB 13.3 12.1  < > 10.8* 11.5* 11.6*  HCT 40 37.0  < > 32.7* 35.2* 35.4*  MCV  --  95.4  < > 92.6 94.6 94.1  PLT 267 179  < > 208 207 222  < > = values in this interval not displayed. Lipid Panel:  Recent Labs  08/25/13 10/27/13  CHOL 123 108  HDL 54 54  LDLCALC 26 33  TRIG 215* 103     Past Procedures:  11/14/13 CXR  IMPRESSION: Persistent left mid lung consolidation, consistent with pneumonia. Increasing, diffuse bilateral interstitial opacities could reflect interstitial pneumonitis or edema.  Assessment/Plan Urine, incontinence, stress female Not new, denied dysuria, suprapubic or CVA tenderness. 2-3x/night. Will try Myrbetriq 25mg  daily. May refer to Urology.    Type 2 diabetes with decreased circulation - hyperglycemia likely due to steroids, A1c 7.2  - continue oral meds-Metformin 1000mg  bid and Glipizide 10mg  bid.  - off insulin. Continue to monitor CBGs     Esophageal reflux w/ hx of bleeding ulcers, stable. Continue omeprazole and prn TUMs EX p meals prn.      COPD (chronic obstructive pulmonary disease) - presently Prednisone 10 mg daily maintenance dose.  - Continue lasix 40mg  - ECHO--EF 33-00%, grade 2 diastolic dysfunction       Hypertension - Continue norvasc 5mg  daily, Metoprolol 50mg  bid, Losartan 100mg ,  doxazosin 8mg , and prn Clonidine 0.1mg  fro Bp>160/100 - On lasix 40 mg daily  - Continue statin  - Bp controlled.     Insomnia, unspecified Stable, continue Doxepin 10mg  qhs       Family/ Staff Communication: observe the patient.   Goals of Care: AL  Labs/tests ordered: none

## 2014-05-16 NOTE — Assessment & Plan Note (Signed)
Stable, continue Doxepin 10mg  qhs

## 2014-05-16 NOTE — Assessment & Plan Note (Signed)
-   hyperglycemia likely due to steroids, A1c 7.2  - continue oral meds-Metformin 1000mg  bid and Glipizide 10mg  bid.  - off insulin. Continue to monitor CBGs

## 2014-05-16 NOTE — Assessment & Plan Note (Signed)
w/ hx of bleeding ulcers, stable. Continue omeprazole and prn TUMs EX p meals prn.

## 2014-05-16 NOTE — Assessment & Plan Note (Signed)
-   presently Prednisone 10 mg daily maintenance dose.  - Continue lasix 40mg  - ECHO--EF 44-31%, grade 2 diastolic dysfunction

## 2014-05-16 NOTE — Assessment & Plan Note (Addendum)
-   Continue norvasc 5mg  daily, Metoprolol 50mg  bid, Losartan 100mg ,  doxazosin 8mg , and prn Clonidine 0.1mg  fro Bp>160/100 - On lasix 40 mg daily  - Continue statin  - Bp controlled.

## 2014-05-22 ENCOUNTER — Encounter: Payer: Self-pay | Admitting: Internal Medicine

## 2014-05-22 DIAGNOSIS — R531 Weakness: Secondary | ICD-10-CM | POA: Insufficient documentation

## 2014-05-22 NOTE — Progress Notes (Signed)
Patient ID: [name], female   DOB: 02-27-1917, 81 y.o.   MRN: [id number]    HISTORY AND PHYSICAL  Location:  [facility] Room Number: N 40 Place of Service: SNF (31)   Extended Emergency Contact Information Primary Emergency Contact: Lubas,[name] Address: [address]          [place], [place] [address] [place] Phone: [phone] Mobile Phone: [phone] Relation: Son Secondary Emergency Contact: Cheron Schaumann Address: [address]          [place], [place] [address] [name] Phone: [phone] Mobile Phone: [phone] Relation: Friend  Advanced Directive information Does patient have an advance directive?: Yes, Type of Advance Directive: Living will;Out of facility DNR (pink MOST or yellow form), Pre-existing out of facility DNR order (yellow form or pink MOST form): Yellow form placed in chart (order not valid for inpatient use) (At [facility]), Does patient want to make changes to advanced directive?: No - Patient declined  Chief Complaint  Patient presents with  . New Admit To SNF    HPI:  Admitted to SNF 11/17/2013.  Hospitalized 11/12/2013 through 11/17/2013. Patient was septic. She had daily pneumonia and the left upper lobe and associated pleuritic left chest pains. There was urinary tract infection with Escherichia coli. Coag negative staph was cultured out of blood. She developed thrush during her period of time on antibiotics.  While hospitalized she was deemed to have acute hypoxic respiratory failure and an acute episode of COPD.  Other medical conditions treated included her chronic hypertension and her diabetes mellitus with renal complications.  She has a known chronic anemia problem. Hemoglobin was as low as 10.8 while hospitalized. She developed hyponatremia but this resolved by the time of her discharge.  The combination of the above problems left her very weak. She is admitted  to SNF at this point for short-term rehabilitation. She is gaining strength. Her goal is to return to her apartment.  Past Medical History  Diagnosis Date  . Type II or unspecified type diabetes mellitus without mention of complication, not stated as uncontrolled   . Hypertension   . Arthritis   . Ulcer   . COPD (chronic obstructive pulmonary disease)   . Regional enteritis of unspecified site   . Other malaise and fatigue   . Anemia, unspecified   . Other and unspecified hyperlipidemia   . Hypoxemia   . Pneumonia, organism unspecified   . Mucopurulent chronic bronchitis   . Palpitations   . Nontoxic uninodular goiter   . Abnormality of gait   . Fall from other slipping, tripping, or stumbling   . Cholelithiases   . Closed fracture of unspecified part of ramus of mandible   . Disturbance of skin sensation   . Sciatica   . Tension headache   . Coronary atherosclerosis of unspecified type of vessel, native or graft   . Osteoarthrosis, unspecified whether generalized or localized, unspecified site   . Insomnia, unspecified   . Edema   . Tension headache   . Pain in joint, lower leg 2010    right knee  . Unspecified venous (peripheral) insufficiency   . Esophageal reflux   . Rosacea   . Cataract 1990    Dr. Katy Fitch  . Pain in joint, shoulder region 2013    left  . Gastric ulcer with hemorrhage 2008  . Diabetes mellitus type 2 in nonobese     Past Surgical History  Procedure Laterality Date  . Total knee arthroplasty Bilateral 1993, 1998    knees  . Other surgical history  2008    Ulcer surgery   . Small intestine surgery    . Eye surgery Bilateral 1990    Dr. Katy Fitch  . Gastrojejunostomy  05/2004    secondary to ulcer  . Joint replacement Bilateral 1993-1998    total knee replacement    Patient Care Team: Estill Dooms, MD as PCP - General (Internal Medicine) Clent Jacks, MD (Ophthalmology) Adrian Prows, MD as Attending Physician (Cardiology) [facility] [name], MD as Consulting Physician (Orthopedic Surgery) Kathee Delton, MD as Consulting Physician (Pulmonary Disease)  History   Social History  . Marital Status: Widowed    Spouse Name: N/A  . Number of Children: N/A  . Years of Education: N/A   Occupational History  . retired Agricultural engineer.     Social History Main Topics  . Smoking status: Former Smoker -- 0.30 packs/day for 10 years    Types: Cigarettes    Quit date: 02/03/1973  . Smokeless tobacco: Never Used     Comment: former casual smoker, lots of second hand smoke exposure  . Alcohol Use: No  . Drug Use: No  . Sexual Activity: No   Other Topics Concern  . Not on file   Social History Narrative   Pt lives at [facility] in the independent living section since 1994.   Pt lives alone.   Has a living will   POA   Power scooter and rolling walker   Exercise none                     reports that she quit smoking about 41 years ago. Her smoking use included Cigarettes. She has a 3 pack-year smoking history. She has never used smokeless tobacco. She reports that she does not drink alcohol or use illicit drugs.  Family History  Problem Relation Age of Onset  . Heart disease Father   . Colon cancer Sister   . Diabetes Brother   . Obesity Brother   . Mental retardation Daughter   . Obesity Daughter    Family Status  Relation Status Death Age  . Father Deceased 48    CAD, Stomach ulcers  . Sister Deceased     cervical and colon cancer  . Brother Alive   . Daughter Alive     Mental retardation, encephalitis  . Mother Deceased 50    History of TIA'S  . Son Alive     Immunization History  Administered Date(s) Administered  . Influenza Split 11/06/2011, 11/04/2012  . Influenza,inj,Quad PF,36+ Mos 11/17/2013  . Pneumococcal Polysaccharide-23 02/04/2003  . Tdap 02/27/2013  . Zoster 02/03/2006    Allergies  Allergen Reactions  . Adhesive [Tape]     unknown  . Aspirin     unknown  .  Codeine     unknown  . Enalapril Cough  . Hydrocodone     unknown  . Pantoprazole Sodium     unknown    Medications: Patient's Medications  New Prescriptions   No medications on file  Previous Medications   ACETAMINOPHEN (TYLENOL) 325 MG TABLET    Take 650 mg by mouth every 6 (six) hours as needed for moderate pain (pain).   ALBUTEROL SULFATE (PROAIR RESPICLICK) 244 (90 BASE) MCG/ACT AEPB    Inhale 2 puffs into the lungs every 6 (six) hours as needed.  AMLODIPINE (NORVASC) 5 MG TABLET    Take 10 mg by mouth daily.    ATORVASTATIN (LIPITOR) 10 MG TABLET    Take 10 mg by mouth daily.   CALCIUM CARBONATE (TUMS EX) 750 MG CHEWABLE TABLET    Chew 2 tablets by mouth 3 (three) times daily as needed for heartburn (after meals if needed for heartburn).    DICLOFENAC SODIUM (VOLTAREN) 1 % GEL    Apply 1 g topically 4 (four) times daily as needed (knees).    DOXAZOSIN (CARDURA) 8 MG TABLET    Take 8 mg by mouth daily. To control BP.   DOXEPIN (SINEQUAN) 10 MG CAPSULE    Take 10 mg by mouth at bedtime as needed (for rest).    FUROSEMIDE (LASIX) 40 MG TABLET    Take 40 mg by mouth daily. Take one daily   GLIPIZIDE (GLUCOTROL) 10 MG TABLET    Take 10 mg by mouth 2 (two) times daily.   GLUCOSE BLOOD TEST STRIP    1 each by Other route daily. Use once daily to test blood sugar.   HYDRALAZINE (APRESOLINE) 25 MG TABLET    Take 1 tablet (25 mg total) by mouth 3 (three) times daily.   HYDROCORTISONE CREAM 1 %    Apply topically 2 (two) times daily. Apply to right upper arm/bicep area bid x one week   LOSARTAN (COZAAR) 100 MG TABLET    Take 100 mg by mouth daily. Take one tablet daily for blood pressure   METFORMIN (GLUCOPHAGE) 500 MG TABLET    Take 1,000 mg by mouth 2 (two) times daily with a meal.   METFORMIN (GLUCOPHAGE) 500 MG TABLET    TAKE 2 TABLETS EVERY MORNING AND 2 TABLETS AT SUPPER TO CONTROL BLOOD SUGAR   METOPROLOL TARTRATE (LOPRESSOR) 25 MG TABLET    One twice daily to control blood pressure     MULTIPLE VITAMIN (MULTIVITAMIN) CAPSULE    Take 1 capsule by mouth daily.   OMEPRAZOLE (PRILOSEC) 20 MG CAPSULE       PREDNISONE (DELTASONE) 10 MG TABLET    Take 6 tablets (60 mg total) by mouth daily with breakfast. On 11/18/13 and decrease by 10mg  daily   SIMETHICONE (GAS RELIEF 80 PO)    Take 1 tablet by mouth daily.    SIMVASTATIN (ZOCOR) 20 MG TABLET    Take 20 mg by mouth daily.  Modified Medications   No medications on file  Discontinued Medications   No medications on file    Review of Systems  Constitutional: Positive for activity change and fatigue. Negative for fever and unexpected weight change.  HENT: Positive for hearing loss (bilateral hearing aids).   Eyes: Positive for visual disturbance (corrective lenses).  Respiratory: Positive for cough and shortness of breath.   Cardiovascular: Positive for leg swelling. Negative for chest pain and palpitations.  Gastrointestinal: Negative.   Endocrine: Negative.        Diabetic.  Genitourinary:       Urinary leakage. History of infections.  Musculoskeletal: Positive for neck pain and neck stiffness.       Unsteady gait. Joint pains. Using powerscooter.  Skin:       Lesion of the left leg medial calf.  Neurological:       History of numbness and headaches. Unsteady gait.  Hematological: Negative.   Psychiatric/Behavioral:       Awakens in the middle of the night. Bartholome Bill trouble falling asleep.    Filed Vitals:   05/22/14  1151  BP: 154/63  Pulse: 60  Temp: 97.5 F (36.4 C)  Resp: 20  Height: 5\' 4"  (1.626 m)  Weight: 184 lb (83.462 kg)  SpO2: 97%   Body mass index is 31.57 kg/(m^2).  Physical Exam  Constitutional: She is oriented to person, place, and time. She appears well-developed and well-nourished. No distress.  HENT:  Head: Normocephalic and atraumatic.  Right Ear: External ear normal.  Left Ear: External ear normal.  Nose: Nose normal.  Eyes: Conjunctivae and EOM are normal. Pupils are equal, round, and  reactive to light.  Neck: Neck supple. No JVD present. No tracheal deviation present. No thyromegaly present.  Cardiovascular: Normal rate, regular rhythm, normal heart sounds and intact distal pulses.  Exam reveals no gallop and no friction rub.   No murmur heard. Pulmonary/Chest: Breath sounds normal. No respiratory distress. She has no wheezes. She has no rales.  Abdominal: Bowel sounds are normal. She exhibits no distension and no mass. There is no tenderness.  Genitourinary:  Prior exam: Vaginal atrophy. Narrowing of introitus.  Musculoskeletal: She exhibits edema (2-3+ bipedal).  Scars from bilateral TKR. Right knee seems to be loose when stressing the joint. No effusion. Instable gait. Driving a power scooter today.  Lymphadenopathy:    She has no cervical adenopathy.  Neurological: She is alert and oriented to person, place, and time. She has normal reflexes. No cranial nerve deficit. Coordination normal.  Diminished vibratory and monofilament testing in both feet.  Skin: Rash (Reddened rash on both legs. Some itching and burning associated with the rash.) noted. No erythema. No pallor.  Red rash of both forelegs. 6 mm nodule of the left posterior neck at the SCM muscle. Not tender.  Psychiatric: She has a normal mood and affect. Her behavior is normal. Judgment and thought content normal.     Labs reviewed: Admission on 11/12/2013, Discharged on 11/17/2013  No results displayed because visit has over 200 results.    Nursing Home on 11/01/2013  Component Date Value Ref Range Status  . Glucose 10/27/2013 137   Final  . BUN 10/27/2013 15  4 - 21 mg/dL Final  . Creatinine 10/27/2013 0.6  0.5 - 1.1 mg/dL Final  . Potassium 10/27/2013 4.5  3.4 - 5.3 mmol/L Final  . Sodium 10/27/2013 139  137 - 147 mmol/L Final  . Triglycerides 10/27/2013 103  40 - 160 mg/dL Final  . Cholesterol 10/27/2013 108  0 - 200 mg/dL Final  . HDL 10/27/2013 54  35 - 70 mg/dL Final  . LDL Cholesterol  10/27/2013 33   Final  . Alkaline Phosphatase 10/27/2013 124  25 - 125 U/L Final  . ALT 10/27/2013 13  7 - 35 U/L Final  . AST 10/27/2013 17  13 - 35 U/L Final  . Bilirubin, Total 10/27/2013 0.3   Final  . Hgb A1c MFr Bld 10/27/2013 7.2* 4.0 - 6.0 % Final  . TSH 10/27/2013 2.42  0.41 - 5.90 uIU/mL Final  Nursing Home on 08/30/2013  Component Date Value Ref Range Status  . Hemoglobin 08/25/2013 13.3  12.0 - 16.0 g/dL Final  . HCT 08/25/2013 40  36 - 46 % Final  . Neutrophils Absolute 08/25/2013 5   Final  . Platelets 08/25/2013 267  150 - 399 K/L Final  . WBC 08/25/2013 7.3   Final  . Glucose 08/25/2013 155   Final  . BUN 08/25/2013 15  4 - 21 mg/dL Final  . Creatinine 08/25/2013 0.7  0.5 - 1.1  mg/dL Final  . Potassium 08/25/2013 4.0  3.4 - 5.3 mmol/L Final  . Sodium 08/25/2013 137  137 - 147 mmol/L Final  . Triglycerides 08/25/2013 215* 40 - 160 mg/dL Final  . Cholesterol 08/25/2013 123  0 - 200 mg/dL Final  . HDL 08/25/2013 54  35 - 70 mg/dL Final  . LDL Cholesterol 08/25/2013 26   Final  . Alkaline Phosphatase 08/25/2013 90  25 - 125 U/L Final  . ALT 08/25/2013 11  7 - 35 U/L Final  . AST 08/25/2013 15  13 - 35 U/L Final  . Bilirubin, Total 08/25/2013 0.3   Final  . Hgb A1c MFr Bld 08/25/2013 7.2* 4.0 - 6.0 % Final    Dg Chest Port 1 View  (if Code Sepsis Called)  11/12/2013   CLINICAL DATA:  One day history of chest pain.  Cough  EXAM: PORTABLE CHEST - 1 VIEW  COMPARISON:  February 27, 2013  FINDINGS: There is focal airspace consolidation in the periphery of the left upper lobe. There is underlying emphysema. Elsewhere the lungs appear clear. Heart is upper normal in size with pulmonary vascularity within normal limits. No adenopathy. There is arthropathy in the thoracic spine in both shoulders.  IMPRESSION: Left upper lobe airspace consolidation. This opacity appears somewhat nodular. While it most likely represents pneumonia, followup images to clearing advised to exclude the  possibility of underlying neoplasm in this area.   Electronically Signed   By: Lowella Grip M.D.   On: 11/12/2013 09:03     Assessment/Plan  Weakness Engaged in physical therapy and occupational therapy for strengthening, site mobility, and improved self-care skills.  Acute respiratory failure with hypoxia Secondary to pneumonia and COPD. Resolved.  Type 2 diabetes with decreased circulation Currently under fairly good control.  Essential hypertension Mild elevation in systolic blood pressure.  COPD with acute exacerbation Chronic lung disease remains. No medications were changed. The acute exacerbation has resolved.  Left upper lobe pneumonia Treated and resolved.

## 2014-06-06 ENCOUNTER — Encounter: Payer: Self-pay | Admitting: Internal Medicine

## 2014-06-06 ENCOUNTER — Non-Acute Institutional Stay: Payer: Medicare Other | Admitting: Internal Medicine

## 2014-06-06 VITALS — BP 146/86 | HR 68 | Temp 97.6°F | Wt 189.0 lb

## 2014-06-06 DIAGNOSIS — J069 Acute upper respiratory infection, unspecified: Secondary | ICD-10-CM | POA: Insufficient documentation

## 2014-06-06 DIAGNOSIS — N393 Stress incontinence (female) (male): Secondary | ICD-10-CM | POA: Diagnosis not present

## 2014-06-06 DIAGNOSIS — I10 Essential (primary) hypertension: Secondary | ICD-10-CM | POA: Diagnosis not present

## 2014-06-06 DIAGNOSIS — D649 Anemia, unspecified: Secondary | ICD-10-CM | POA: Diagnosis not present

## 2014-06-06 DIAGNOSIS — E1159 Type 2 diabetes mellitus with other circulatory complications: Secondary | ICD-10-CM | POA: Diagnosis not present

## 2014-06-06 DIAGNOSIS — J479 Bronchiectasis, uncomplicated: Secondary | ICD-10-CM | POA: Diagnosis not present

## 2014-06-06 DIAGNOSIS — J439 Emphysema, unspecified: Secondary | ICD-10-CM | POA: Diagnosis not present

## 2014-06-06 DIAGNOSIS — C44729 Squamous cell carcinoma of skin of left lower limb, including hip: Secondary | ICD-10-CM | POA: Diagnosis not present

## 2014-06-06 DIAGNOSIS — E1151 Type 2 diabetes mellitus with diabetic peripheral angiopathy without gangrene: Secondary | ICD-10-CM

## 2014-06-06 MED ORDER — DOXYCYCLINE HYCLATE 100 MG PO TABS: 100.0000 mg | ORAL_TABLET | Freq: Two times a day (BID) | ORAL | Status: DC

## 2014-06-06 MED ORDER — PREDNISONE 20 MG PO TABS: ORAL_TABLET | ORAL | Status: DC

## 2014-06-06 NOTE — Progress Notes (Signed)
Patient ID: [name], female   DOB: [birth year], 81 y.o.   MRN: [id number]    [place] Room Number: AL08  Place of Service: Clinic (12)     Allergies  Allergen Reactions  . Adhesive [Tape]     unknown  . Aspirin     unknown  . Codeine     unknown  . Enalapril Cough  . Hydrocodone     unknown  . Pantoprazole Sodium     unknown    Chief Complaint  Patient presents with  . Medical Management of Chronic Issues    blood pressure, blood sugar, anemia  . skin surgery    05/11/14 had mole removed from left hand and left side of face at [place]    HPI:  Patient presents today for an acute respiratory infection as well as routine follow-up of problems listed above.  Blood pressure is variable but she tends to run a slight elevation in the systolic blood pressure in the 140s. Diastolic blood pressure remains normal. She is already on 4 drug therapy for her blood pressure. And she gets a little dizzy when she stands up.  Blood sugars are controlled to range between 85 and 157.  Patient recently had surgery on the left hand at the [facility] by Dr. Redmond Pulling to remove a skin cancer in the dorsal aspect of the hand or the thumb. It is healing slowly. There remains raw tissue that is oozing a bit.  Anemia, unspecified anemia type: Hemoglobin improved to 11.2 g percent  Squamous cell carcinoma of skin of lower limb, including hip, left: Ultimately I think this will need surgical removal. Patient is to follow-up with her dermatologic surgery.  Type 2 diabetes with decreased circulation: Although hemoglobin A1c is modestly elevated, her occasional low sugars (down to 64) prevent me from wanting to prescribe more medication. She remains on glipizide and metformin.  Pulmonary emphysema, unspecified emphysema type: Stable with shortness of breath on exertion.   Past Medical History  Diagnosis Date  . Type II or unspecified  type diabetes mellitus without mention of complication, not stated as uncontrolled   . Hypertension   . Arthritis   . COPD (chronic obstructive pulmonary disease)   . Regional enteritis of unspecified site   . Other malaise and fatigue   . Anemia, unspecified   . Other and unspecified hyperlipidemia   . Hypoxemia   . Pneumonia, organism unspecified   . Mucopurulent chronic bronchitis   . Palpitations   . Nontoxic uninodular goiter   . Abnormality of gait   . Fall from other slipping, tripping, or stumbling   . Cholelithiases   . Closed fracture of unspecified part of ramus of mandible   . Disturbance of skin sensation   . Sciatica   . Tension headache   . Coronary atherosclerosis of unspecified type of vessel, native or graft   . Osteoarthrosis, unspecified whether generalized or localized, unspecified site   . Insomnia, unspecified   . Edema   . Tension headache   . Pain in joint, lower leg 2010    right knee  . Unspecified venous (peripheral) insufficiency   . Esophageal reflux   . Rosacea   . Cataract 1990    Dr. Katy Fitch  . Pain in joint, shoulder region 2013    left  . Gastric ulcer with hemorrhage 2008  . Urine, incontinence, stress female 07/19/2013  . Bronchiectasis without acute exacerbation 04/10/2011  CT chest 2011   . DOE (dyspnea on exertion) 04/10/2011    PFTs 2013:  FEV1 1.04 (78%), ratio 64, couldn't do maneuver for lung volumes or dlco   . Nodule of neck 11/07/2013    Left neck posteriorly   . Squamous cell carcinoma of skin of lower limb, including hip 07/27/2012    Nonhealing lesion of the left mid calf medially    Past Surgical History  Procedure Laterality Date  . Total knee arthroplasty Bilateral 1993, 1998    knees  . Other surgical history  2008    Ulcer surgery   . Small intestine surgery    . Eye surgery Bilateral 1990    Dr. Katy Fitch  . Gastrojejunostomy  05/2004    secondary to ulcer  . Joint replacement Bilateral 1993-1998    total knee  replacement  . Mole removal  05/11/14    left hand and left side of face [facility]     History   Social History  . Marital Status: Widowed    Spouse Name: N/A  . Number of Children: N/A  . Years of Education: N/A   Occupational History  . retired Agricultural engineer.     Social History Main Topics  . Smoking status: Former Smoker -- 0.30 packs/day for 10 years    Types: Cigarettes    Quit date: 02/03/1973  . Smokeless tobacco: Never Used     Comment: former casual smoker, lots of second hand smoke exposure  . Alcohol Use: No  . Drug Use: No  . Sexual Activity: No   Other Topics Concern  . None   Social History Narrative   Pt lives at [facility] in the independent living section since 1994.   Pt lives alone.   Has a living will   POA   Power scooter and rolling walker   Exercise none                   Family History  Problem Relation Age of Onset  . Heart disease Father   . Colon cancer Sister   . Diabetes Brother   . Obesity Brother   . Mental retardation Daughter   . Obesity Daughter      Medications: Patient's Medications  New Prescriptions   No medications on file  Previous Medications   ACETAMINOPHEN (TYLENOL) 325 MG TABLET    Take 650 mg by mouth every 6 (six) hours as needed for moderate pain (pain).   ALBUTEROL SULFATE (PROAIR RESPICLICK) 737 (90 BASE) MCG/ACT AEPB    Inhale 2 puffs into the lungs every 6 (six) hours as needed.   AMLODIPINE (NORVASC) 5 MG TABLET    Take 10 mg by mouth daily.    ATORVASTATIN (LIPITOR) 10 MG TABLET    Take 10 mg by mouth daily.   CALCIUM CARBONATE (TUMS EX) 750 MG CHEWABLE TABLET    Chew 2 tablets by mouth 3 (three) times daily as needed for heartburn (after meals if needed for heartburn).    DICLOFENAC SODIUM (VOLTAREN) 1 % GEL    Apply 1 g topically 4 (four) times daily as needed (knees).    DOXAZOSIN (CARDURA) 8 MG TABLET    Take 8 mg by mouth daily. To control BP.   DOXEPIN (SINEQUAN) 10 MG CAPSULE     Take 10 mg by mouth at bedtime as needed (for rest).    FUROSEMIDE (LASIX) 40 MG TABLET    Take 40 mg by mouth daily. Take  one daily   GLIPIZIDE (GLUCOTROL) 10 MG TABLET    Take 10 mg by mouth 2 (two) times daily.   GLUCOSE BLOOD TEST STRIP    1 each by Other route daily. Use once daily to test blood sugar.   HYDRALAZINE (APRESOLINE) 25 MG TABLET    Take 1 tablet (25 mg total) by mouth 3 (three) times daily.   HYDROCORTISONE CREAM 1 %    Apply topically 2 (two) times daily. Apply to right upper arm/bicep area bid x one week   LOSARTAN (COZAAR) 100 MG TABLET    Take 100 mg by mouth daily. Take one tablet daily for blood pressure   METFORMIN (GLUCOPHAGE) 500 MG TABLET    Take 1,000 mg by mouth 2 (two) times daily with a meal.   METOPROLOL TARTRATE (LOPRESSOR) 25 MG TABLET    One twice daily to control blood pressure   MULTIPLE VITAMIN (MULTIVITAMIN) CAPSULE    Take 1 capsule by mouth daily.   MYRBETRIQ 25 MG TB24 TABLET    Take one tablet daily for bladder control   OMEPRAZOLE (PRILOSEC) 20 MG CAPSULE       PREDNISONE (DELTASONE) 10 MG TABLET    Take 6 tablets (60 mg total) by mouth daily with breakfast. On 11/18/13 and decrease by 10mg  daily   SIMETHICONE (GAS RELIEF 80 PO)    Take 1 tablet by mouth daily.    SIMVASTATIN (ZOCOR) 20 MG TABLET    Take 20 mg by mouth daily.  Modified Medications   No medications on file  Discontinued Medications   METFORMIN (GLUCOPHAGE) 500 MG TABLET    TAKE 2 TABLETS EVERY MORNING AND 2 TABLETS AT SUPPER TO CONTROL BLOOD SUGAR     Review of Systems  Constitutional: Negative for fever, chills and diaphoresis.       Baseline mobility is motorized scooter  HENT: Positive for hearing loss. Negative for congestion, ear discharge, ear pain, nosebleeds, sore throat and tinnitus.   Eyes: Negative for photophobia and pain.  Respiratory: Positive for cough, shortness of breath and wheezing. Negative for stridor.   Cardiovascular: Positive for leg swelling. Negative  for chest pain and palpitations.       Trace. Right knee pain.   Gastrointestinal: Negative for nausea, vomiting, abdominal pain, diarrhea, constipation and blood in stool.  Endocrine: Negative for polydipsia.  Genitourinary: Positive for frequency. Negative for dysuria, urgency, hematuria and flank pain.       No better since ABT  Musculoskeletal: Negative for myalgias, back pain and neck pain.       R knee pain  Skin: Negative for rash.       Healing wound of the left hand dorsum near the thumb where a lesion was removed by Dr. Redmond Pulling at the [place] at [facility]. Crusted lesion with an erythematous ring around it on the left lower leg medially that is likely to be squamous cell cancer.  Allergic/Immunologic: Negative for environmental allergies.  Neurological: Positive for weakness. Negative for dizziness, tremors, seizures and headaches.  Hematological: Does not bruise/bleed easily.  Psychiatric/Behavioral: Negative for suicidal ideas and hallucinations. The patient is not nervous/anxious.     Filed Vitals:   06/06/14 1004  BP: 146/86  Pulse: 68  Temp: 97.6 F (36.4 C)  TempSrc: Oral  Weight: 189 lb (85.73 kg)  SpO2: 96%   Body mass index is 32.43 kg/(m^2).  Physical Exam  Constitutional: She is oriented to person, place, and time. She appears well-developed and well-nourished. No  distress.  HENT:  Head: Normocephalic and atraumatic.  Right Ear: External ear normal.  Left Ear: External ear normal.  Nose: Nose normal.  Eyes: Conjunctivae and EOM are normal. Pupils are equal, round, and reactive to light.  Neck: Neck supple. No JVD present. No tracheal deviation present. No thyromegaly present.  Cardiovascular: Normal rate, regular rhythm, normal heart sounds and intact distal pulses.  Exam reveals no gallop and no friction rub.   No murmur heard. Pulmonary/Chest: No respiratory distress. She has wheezes. She has rales.  Abdominal: Bowel sounds are normal. She  exhibits no distension and no mass. There is no tenderness.  Musculoskeletal: She exhibits edema (2-3+ bipedal) and tenderness.  Scars from bilateral TKR. Right knee seems to be loose when stressing the joint. No effusion. Instable gait. Driving a power scooter today. Trace edema BLE Chronic R knee pain  Lymphadenopathy:    She has no cervical adenopathy.  Neurological: She is alert and oriented to person, place, and time. She has normal reflexes. No cranial nerve deficit. Coordination normal.  Diminished vibratory and monofilament testing in both feet.  Skin: No rash (Reddened rash on both legs. Some itching and burning associated with the rash.) noted. No erythema. No pallor.  Prior exam: 6 mm nodule of the left posterior neck at the SCM muscle seems resolved. Unable to find today. Likely SCC left leg medial to shin.  Psychiatric: She has a normal mood and affect. Her behavior is normal. Judgment and thought content normal.     Labs reviewed: Nursing Home on 06/06/2014  Component Date Value Ref Range Status  . Hemoglobin 03/09/2014 11.4* 12.0 - 16.0 g/dL Final  . HCT 03/09/2014 34* 36 - 46 % Final  . Platelets 03/09/2014 215  150 - 399 K/L Final  . WBC 03/09/2014 8.2   Final  . Glucose 03/09/2014 64   Final  . BUN 03/09/2014 21  4 - 21 mg/dL Final  . Creatinine 03/09/2014 0.7  0.5 - 1.1 mg/dL Final  . Potassium 03/09/2014 4.0  3.4 - 5.3 mmol/L Final  . Sodium 03/09/2014 137  137 - 147 mmol/L Final  . TSH 03/09/2014 7.60* 0.41 - 5.90 uIU/mL Final     Assessment/Plan  1. Acute upper respiratory infection - doxycycline (VIBRA-TABS) 100 MG tablet; Take 1 tablet (100 mg total) by mouth 2 (two) times daily.  Dispense: 20 tablet; Refill: 0 - predniSONE (DELTASONE) 20 MG tablet; One daily for the next 7 days, then resume 10 mg prednisone daily  Dispense: 7 tablet; Refill: 0  2. Essential hypertension Controlled  3. Bronchiectasis without complication Stable  4. Anemia,  unspecified anemia type Improved  5. Squamous cell carcinoma of skin of lower limb, including hip, left Refer to skin surgeon  6. Type 2 diabetes with decreased circulation Continue current medications  7. Pulmonary emphysema, unspecified emphysema type Unchanged  8. Urine, incontinence, stress female - MYRBETRIQ 25 MG TB24 tablet; Take one tablet daily for bladder control; Refill: 10  9. Ordered Prevnar vaccine.

## 2014-06-16 ENCOUNTER — Non-Acute Institutional Stay: Payer: Medicare Other | Admitting: Nurse Practitioner

## 2014-06-16 ENCOUNTER — Encounter: Payer: Self-pay | Admitting: Nurse Practitioner

## 2014-06-16 DIAGNOSIS — E1151 Type 2 diabetes mellitus with diabetic peripheral angiopathy without gangrene: Secondary | ICD-10-CM

## 2014-06-16 DIAGNOSIS — J069 Acute upper respiratory infection, unspecified: Secondary | ICD-10-CM | POA: Diagnosis not present

## 2014-06-16 DIAGNOSIS — J449 Chronic obstructive pulmonary disease, unspecified: Secondary | ICD-10-CM | POA: Diagnosis not present

## 2014-06-16 DIAGNOSIS — R112 Nausea with vomiting, unspecified: Secondary | ICD-10-CM

## 2014-06-16 DIAGNOSIS — E1159 Type 2 diabetes mellitus with other circulatory complications: Secondary | ICD-10-CM

## 2014-06-16 DIAGNOSIS — I10 Essential (primary) hypertension: Secondary | ICD-10-CM | POA: Diagnosis not present

## 2014-06-16 DIAGNOSIS — K219 Gastro-esophageal reflux disease without esophagitis: Secondary | ICD-10-CM

## 2014-06-16 LAB — BASIC METABOLIC PANEL
BUN: 21 mg/dL (ref 4–21)
Creatinine: 0.7 mg/dL (ref 0.5–1.1)
Potassium: 3.9 mmol/L (ref 3.4–5.3)
SODIUM: 134 mmol/L — AB (ref 137–147)

## 2014-06-16 LAB — CBC AND DIFFERENTIAL
HCT: 41 % (ref 36–46)
Hemoglobin: 13.4 g/dL (ref 12.0–16.0)
PLATELETS: 214 10*3/uL (ref 150–399)
WBC: 10.1 10*3/mL

## 2014-06-16 NOTE — Assessment & Plan Note (Signed)
06/15/14 CXR stable in comparison with prior exam.  - presently Prednisone 10 mg daily maintenance dose.  - Continue lasix 40mg  - ECHO--EF 64-29%, grade 2 diastolic dysfunction

## 2014-06-16 NOTE — Assessment & Plan Note (Signed)
-   Continue norvasc 5mg  daily, Metoprolol 50mg  bid, Losartan 100mg ,  doxazosin 8mg , and prn Clonidine 0.1mg  fro Bp>160/100 - On lasix 40 mg daily  - Continue statin  - Bp controlled.

## 2014-06-16 NOTE — Assessment & Plan Note (Signed)
w/ hx of bleeding ulcers, stable. Continue omeprazole and prn TUMs EX p meals prn.

## 2014-06-16 NOTE — Assessment & Plan Note (Signed)
Completed 7 day course of Doxycycline 100mg  daily and Prednisone 20mg  daily started 06/05/08/ she is at her baseline COPD.

## 2014-06-16 NOTE — Assessment & Plan Note (Signed)
hyperglycemia likely due to steroids, A1c 7.2  - continue oral meds-Metformin 1000mg  bid and Glipizide 10mg  bid.  - off insulin. Continue to monitor CBGs

## 2014-06-16 NOTE — Progress Notes (Signed)
Patient ID: [name], female   DOB: 1917-02-08, 81 y.o.   MRN: [id number]    Code Status: DNR  Allergies  Allergen Reactions  . Adhesive [Tape]     unknown  . Aspirin     unknown  . Codeine     unknown  . Enalapril Cough  . Hydrocodone     unknown  . Pantoprazole Sodium     unknown    Chief Complaint  Patient presents with  . Medical Management of Chronic Issues  . Acute Visit    nausea and vomiting.     HPI: Patient is a 81 y.o. female seen in the AL at [facility] today for  evaluation of nausea, vomiting,  and other chronic medical conditions Problem List Items Addressed This Visit    COPD (chronic obstructive pulmonary disease) (Chronic)    06/15/14 CXR stable in comparison with prior exam.  - presently Prednisone 10 mg daily maintenance dose.  - Continue lasix 40mg  - ECHO--EF 29-93%, grade 2 diastolic dysfunction            Hypertension (Chronic)    - Continue norvasc 5mg  daily, Metoprolol 50mg  bid, Losartan 100mg ,  doxazosin 8mg , and prn Clonidine 0.1mg  fro Bp>160/100 - On lasix 40 mg daily  - Continue statin  - Bp controlled.        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  - off insulin. Continue to monitor CBGs        Esophageal reflux    w/ hx of bleeding ulcers, stable. Continue omeprazole and prn TUMs EX p meals prn.          Acute upper respiratory infection    Completed 7 day course of Doxycycline 100mg  daily and Prednisone 20mg  daily started 06/05/08/ she is at her baseline COPD.       Nausea and vomiting - Primary    Reported 06/15/14,Phenergan prn has been effective, CXR 06/15/14 stable in comparison with prior exam, UA C/S, CBC, BMP fending. Will obtain US liver, gallbladder, pancreas, and spleen. Also check amylase and lipase in am. Zofran 4mg  q6h x 48hours. Continue Omeprazole 20mg  daily.            Review of Systems:  Review of Systems  Constitutional: Negative for fever, chills and diaphoresis.  HENT: Positive for hearing loss. Negative for congestion, ear discharge, ear pain, nosebleeds, sore throat and tinnitus.   Eyes: Negative for photophobia, pain, discharge and redness.  Respiratory: Positive for cough and shortness of breath. Negative for wheezing and stridor.   Cardiovascular: Positive for leg swelling. Negative for chest pain and palpitations.       Trace in BLE  Gastrointestinal: Positive for nausea and vomiting. Negative for abdominal pain, diarrhea, constipation and blood in stool.  Endocrine: Negative for polydipsia.  Genitourinary: Positive for frequency. Negative for dysuria, urgency, hematuria and flank pain.       Leaking urine when stands up  Musculoskeletal: Negative for myalgias, back pain and neck pain.  Skin: Negative for rash.  Allergic/Immunologic: Negative for environmental allergies.  Neurological: Negative for dizziness, tremors, seizures, weakness and headaches.  Hematological: Does not bruise/bleed easily.  Psychiatric/Behavioral: Negative for hallucinations. The patient is not nervous/anxious.      Past Medical History  Diagnosis Date  . Type II or unspecified type diabetes mellitus without mention of complication, not stated  as uncontrolled   . Hypertension   . Arthritis   . COPD (chronic obstructive pulmonary disease)   . Regional enteritis of unspecified site   . Other malaise and fatigue   . Anemia, unspecified   . Other and unspecified hyperlipidemia   . Hypoxemia   . Pneumonia, organism unspecified   . Mucopurulent chronic bronchitis   . Palpitations   . Nontoxic uninodular goiter   . Abnormality of gait   . Fall from other slipping, tripping, or stumbling   . Cholelithiases   . Closed fracture of unspecified part of ramus of mandible   . Disturbance of skin sensation   . Sciatica   . Tension headache   . Coronary  atherosclerosis of unspecified type of vessel, native or graft   . Osteoarthrosis, unspecified whether generalized or localized, unspecified site   . Insomnia, unspecified   . Edema   . Tension headache   . Pain in joint, lower leg 2010    right knee  . Unspecified venous (peripheral) insufficiency   . Esophageal reflux   . Rosacea   . Cataract 1990    Dr. Katy Fitch  . Pain in joint, shoulder region 2013    left  . Gastric ulcer with hemorrhage 2008  . Urine, incontinence, stress female 07/19/2013  . Bronchiectasis without acute exacerbation 04/10/2011    CT chest 2011   . DOE (dyspnea on exertion) 04/10/2011    PFTs 2013:  FEV1 1.04 (78%), ratio 64, couldn't do maneuver for lung volumes or dlco   . Nodule of neck 11/07/2013    Left neck posteriorly   . Squamous cell carcinoma of skin of lower limb, including hip 07/27/2012    Nonhealing lesion of the left mid calf medially    Past Surgical History  Procedure Laterality Date  . Total knee arthroplasty Bilateral 1993, 1998    knees  . Other surgical history  2008    Ulcer surgery   . Small intestine surgery    . Eye surgery Bilateral 1990    Dr. Katy Fitch  . Gastrojejunostomy  05/2004    secondary to ulcer  . Joint replacement Bilateral 1993-1998    total knee replacement  . Mole removal  05/11/14    left hand and left side of face [facility]   Social History:   reports that she quit smoking about 41 years ago. Her smoking use included Cigarettes. She has a 3 pack-year smoking history. She has never used smokeless tobacco. She reports that she does not drink alcohol or use illicit drugs.  Family History  Problem Relation Age of Onset  . Heart disease Father   . Colon cancer Sister   . Diabetes Brother   . Obesity Brother   . Mental retardation Daughter   . Obesity Daughter     Medications: Patient's Medications  New Prescriptions   No medications on file  Previous Medications   ACETAMINOPHEN (TYLENOL) 325 MG TABLET     Take 650 mg by mouth every 6 (six) hours as needed for moderate pain (pain).   ALBUTEROL SULFATE (PROAIR RESPICLICK) 673 (90 BASE) MCG/ACT AEPB    Inhale 2 puffs into the lungs every 6 (six) hours as needed.   AMLODIPINE (NORVASC) 5 MG TABLET    Take 10 mg by mouth daily.    ATORVASTATIN (LIPITOR) 10 MG TABLET    Take 10 mg by mouth daily.   CALCIUM CARBONATE (TUMS EX) 750 MG CHEWABLE TABLET    Chew  2 tablets by mouth 3 (three) times daily as needed for heartburn (after meals if needed for heartburn).    DICLOFENAC SODIUM (VOLTAREN) 1 % GEL    Apply 1 g topically 4 (four) times daily as needed (knees).    DOXAZOSIN (CARDURA) 8 MG TABLET    Take 8 mg by mouth daily. To control BP.   DOXEPIN (SINEQUAN) 10 MG CAPSULE    Take 10 mg by mouth at bedtime as needed (for rest).    DOXYCYCLINE (VIBRA-TABS) 100 MG TABLET    Take 1 tablet (100 mg total) by mouth 2 (two) times daily.   FUROSEMIDE (LASIX) 40 MG TABLET    Take 40 mg by mouth daily. Take one daily   GLIPIZIDE (GLUCOTROL) 10 MG TABLET    Take 10 mg by mouth 2 (two) times daily.   GLUCOSE BLOOD TEST STRIP    1 each by Other route daily. Use once daily to test blood sugar.   HYDRALAZINE (APRESOLINE) 25 MG TABLET    Take 1 tablet (25 mg total) by mouth 3 (three) times daily.   HYDROCORTISONE CREAM 1 %    Apply topically 2 (two) times daily. Apply to right upper arm/bicep area bid x one week   LOSARTAN (COZAAR) 100 MG TABLET    Take 100 mg by mouth daily. Take one tablet daily for blood pressure   METFORMIN (GLUCOPHAGE) 500 MG TABLET    Take 1,000 mg by mouth 2 (two) times daily with a meal.   METOPROLOL TARTRATE (LOPRESSOR) 25 MG TABLET    One twice daily to control blood pressure   MULTIPLE VITAMIN (MULTIVITAMIN) CAPSULE    Take 1 capsule by mouth daily.   MYRBETRIQ 25 MG TB24 TABLET    Take one tablet daily for bladder control   OMEPRAZOLE (PRILOSEC) 20 MG CAPSULE       PREDNISONE (DELTASONE) 10 MG TABLET    Take 6 tablets (60 mg total) by mouth  daily with breakfast. On 11/18/13 and decrease by 10mg  daily   PREDNISONE (DELTASONE) 20 MG TABLET    One daily for the next 7 days, then resume 10 mg prednisone daily   SIMETHICONE (GAS RELIEF 80 PO)    Take 1 tablet by mouth daily.    SIMVASTATIN (ZOCOR) 20 MG TABLET    Take 20 mg by mouth daily.  Modified Medications   No medications on file  Discontinued Medications   No medications on file     Physical Exam: Physical Exam  Constitutional: She is oriented to person, place, and time. She appears well-developed and well-nourished. No distress.  HENT:  Head: Normocephalic and atraumatic.  Right Ear: External ear normal.  Left Ear: External ear normal.  Nose: Nose normal.  Eyes: Conjunctivae and EOM are normal. Pupils are equal, round, and reactive to light.  Neck: Neck supple. No JVD present. No tracheal deviation present. No thyromegaly present.  Cardiovascular: Normal rate, regular rhythm, normal heart sounds and intact distal pulses.  Exam reveals no gallop and no friction rub.   No murmur heard. Pulmonary/Chest: No respiratory distress. She has no wheezes. She has rales.  Abdominal: Bowel sounds are normal. She exhibits no distension and no mass. There is no tenderness.  Musculoskeletal: She exhibits edema (2-3+ bipedal) and tenderness.  Scars from bilateral TKR. Right knee seems to be loose when stressing the joint. No effusion. Instable gait. Driving a power scooter today. Trace edema BLE Chronic R knee pain  Lymphadenopathy:    She has  no cervical adenopathy.  Neurological: She is alert and oriented to person, place, and time. She has normal reflexes. No cranial nerve deficit. Coordination normal.  Diminished vibratory and monofilament testing in both feet.  Skin: No rash (Reddened rash on both legs. Some itching and burning associated with the rash.) noted. No erythema. No pallor.  Prior exam: 6 mm nodule of the left posterior neck at the SCM muscle. Not tender.    Psychiatric: She has a normal mood and affect. Her behavior is normal. Judgment and thought content normal.    Filed Vitals:   06/16/14 1332  BP: 151/68  Pulse: 88  Temp: 100.8 F (38.2 C)  TempSrc: Tympanic  Resp: 20      Labs reviewed: Basic Metabolic Panel:  Recent Labs  10/27/13  11/15/13 0458 11/16/13 0432 11/17/13 0439 03/09/14  NA 139  < > 138 141 140 137  K 4.5  < > 3.5* 3.3* 3.2* 4.0  CL  --   < > 100 100 98  --   CO2  --   < > 26 29 33*  --   GLUCOSE  --   < > 249* 161* 126*  --   BUN 15  < > 19 21 18 21   CREATININE 0.6  < > 0.62 0.67 0.59 0.7  CALCIUM  --   < > 8.3* 8.3* 8.2*  --   TSH 2.42  --   --   --   --  7.60*  < > = values in this interval not displayed. Liver Function Tests:  Recent Labs  08/25/13 10/27/13 11/12/13 0840  AST 15 17 15   ALT 11 13 11   ALKPHOS 90 124 85  BILITOT  --   --  0.5  PROT  --   --  6.8  ALBUMIN  --   --  3.5   No results for input(s): LIPASE, AMYLASE in the last 8760 hours. No results for input(s): AMMONIA in the last 8760 hours. CBC:  Recent Labs  08/25/13 11/12/13 0840  11/15/13 0458 11/16/13 0432 11/17/13 0439 03/09/14  WBC 7.3 22.1*  < > 14.2* 14.1* 13.3* 8.2  NEUTROABS 5 20.1*  --   --   --   --   --   HGB 13.3 12.1  < > 10.8* 11.5* 11.6* 11.4*  HCT 40 37.0  < > 32.7* 35.2* 35.4* 34*  MCV  --  95.4  < > 92.6 94.6 94.1  --   PLT 267 179  < > 208 207 222 215  < > = values in this interval not displayed. Lipid Panel:  Recent Labs  08/25/13 10/27/13  CHOL 123 108  HDL 54 54  LDLCALC 26 33  TRIG 215* 103    Past Procedures:  11/14/13 CXR  IMPRESSION: Persistent left mid lung consolidation, consistent with pneumonia. Increasing, diffuse bilateral interstitial opacities could reflect interstitial pneumonitis or edema.  06/15/14 CXR findings are stable in comparison with prior exam. Mild cardiomegaly. Mild osteoporosis. Moderate osteoarthritis.  Assessment/Plan COPD (chronic obstructive  pulmonary disease) 06/15/14 CXR stable in comparison with prior exam.  - presently Prednisone 10 mg daily maintenance dose.  - Continue lasix 40mg  - ECHO--EF 75-64%, grade 2 diastolic dysfunction         Hypertension - Continue norvasc 5mg  daily, Metoprolol 50mg  bid, Losartan 100mg ,  doxazosin 8mg , and prn Clonidine 0.1mg  fro Bp>160/100 - On lasix 40 mg daily  - Continue statin  - Bp controlled.     Type 2  diabetes with decreased circulation  hyperglycemia likely due to steroids, A1c 7.2  - continue oral meds-Metformin 1000mg  bid and Glipizide 10mg  bid.  - off insulin. Continue to monitor CBGs     Esophageal reflux w/ hx of bleeding ulcers, stable. Continue omeprazole and prn TUMs EX p meals prn.       Nausea and vomiting Reported 06/15/14,Phenergan prn has been effective, CXR 06/15/14 stable in comparison with prior exam, UA C/S, CBC, BMP fending. Will obtain US liver, gallbladder, pancreas, and spleen. Also check amylase and lipase in am. Zofran 4mg  q6h x 48hours. Continue Omeprazole 20mg  daily.    Acute upper respiratory infection Completed 7 day course of Doxycycline 100mg  daily and Prednisone 20mg  daily started 06/05/08/ she is at her baseline COPD.      Family/ Staff Communication: observe the patient.   Goals of Care: AL  Labs/tests ordered: Amylase, Lipase, US liver, gallbladder, pancreas, and spleen.

## 2014-06-16 NOTE — Assessment & Plan Note (Addendum)
Reported 06/15/14,Phenergan prn has been effective, CXR 06/15/14 stable in comparison with prior exam, UA C/S, CBC, BMP fending. Will obtain US liver, gallbladder, pancreas, and spleen. Also check amylase and lipase in am. Zofran 4mg  q6h x 48hours. Continue Omeprazole 20mg  daily.

## 2014-06-20 ENCOUNTER — Other Ambulatory Visit: Payer: Self-pay | Admitting: Nurse Practitioner

## 2014-06-20 DIAGNOSIS — R112 Nausea with vomiting, unspecified: Secondary | ICD-10-CM

## 2014-06-20 NOTE — Assessment & Plan Note (Signed)
06/17/14 amylase 33, lipase <10

## 2014-06-23 ENCOUNTER — Encounter: Payer: Self-pay | Admitting: Nurse Practitioner

## 2014-06-23 DIAGNOSIS — N390 Urinary tract infection, site not specified: Secondary | ICD-10-CM | POA: Insufficient documentation

## 2014-07-11 ENCOUNTER — Encounter: Payer: Self-pay | Admitting: Internal Medicine

## 2014-07-11 ENCOUNTER — Non-Acute Institutional Stay: Payer: Medicare Other | Admitting: Internal Medicine

## 2014-07-11 VITALS — BP 136/60 | HR 88 | Temp 97.4°F | Wt 188.0 lb

## 2014-07-11 DIAGNOSIS — D638 Anemia in other chronic diseases classified elsewhere: Secondary | ICD-10-CM

## 2014-07-11 DIAGNOSIS — J449 Chronic obstructive pulmonary disease, unspecified: Secondary | ICD-10-CM | POA: Diagnosis not present

## 2014-07-11 DIAGNOSIS — C44729 Squamous cell carcinoma of skin of left lower limb, including hip: Secondary | ICD-10-CM | POA: Diagnosis not present

## 2014-07-11 DIAGNOSIS — I10 Essential (primary) hypertension: Secondary | ICD-10-CM

## 2014-07-11 DIAGNOSIS — N3000 Acute cystitis without hematuria: Secondary | ICD-10-CM

## 2014-07-11 DIAGNOSIS — Z418 Encounter for other procedures for purposes other than remedying health state: Secondary | ICD-10-CM | POA: Diagnosis not present

## 2014-07-11 DIAGNOSIS — J069 Acute upper respiratory infection, unspecified: Secondary | ICD-10-CM | POA: Diagnosis not present

## 2014-07-11 DIAGNOSIS — N393 Stress incontinence (female) (male): Secondary | ICD-10-CM

## 2014-07-11 DIAGNOSIS — Z299 Encounter for prophylactic measures, unspecified: Secondary | ICD-10-CM

## 2014-07-11 MED ORDER — MIRABEGRON ER 50 MG PO TB24: ORAL_TABLET | ORAL | Status: DC

## 2014-07-11 MED ORDER — PREDNISONE 5 MG PO TABS: ORAL_TABLET | ORAL | Status: DC

## 2014-07-11 NOTE — Progress Notes (Signed)
Patient ID: [name], female   DOB: 18-Apr-1917, 81 y.o.   MRN: [id number]    [place] Room Number: AL 08  Place of Service: Clinic (12)     Allergies  Allergen Reactions  . Adhesive [Tape]     unknown  . Aspirin     unknown  . Codeine     unknown  . Enalapril Cough  . Hydrocodone     unknown  . Pantoprazole Sodium     unknown    Chief Complaint  Patient presents with  . Medical Management of Chronic Issues    blood pressure, blood sugar, anemia, urine incontinence-stress    HPI:   Urine, incontinence, stress female: Continues with urinary frequency as her largest complaint. Also has nocturia 2-3 times per night. Denies dysuria. She did have UTI about 3 weeks ago that was treated with Septra. Denies dysuria at this time.  Acute upper respiratory infection: Treated for this in early May 2016. Now resolved. Continues to be short of breath and have a cough consistent with her known COPD. Remains on prednisone 5 mg daily. No wheezing.  Anemia of chronic disease: Resolved with last hemoglobin 13.4 g percent.  Chronic obstructive pulmonary disease, unspecified COPD, unspecified chronic bronchitis type: Remains on prednisone 5 mg daily.  Essential hypertension: Controlled  Squamous cell carcinoma of skin of lower limb, including hip, left: Resolved. Patient says she has no problems with skin cancers on her leg or hip.  Acute cystitis without hematuria: Klebsiella pneumonia cultured in mid May 2016. Treated with Septra. Asymptomatic now.    Medications: Patient's Medications  New Prescriptions   No medications on file  Previous Medications   ACETAMINOPHEN (TYLENOL) 325 MG TABLET    Take 650 mg by mouth every 6 (six) hours as needed for moderate pain (pain).   ACETAMINOPHEN (TYLENOL) 500 MG TABLET    Take 500 mg by mouth. Take one tablet twice daily   AMLODIPINE (NORVASC) 5 MG TABLET    Take 10 mg by mouth daily.    ATORVASTATIN  (LIPITOR) 10 MG TABLET    Take 10 mg by mouth daily.   CALCIUM CARBONATE (TUMS EX) 750 MG CHEWABLE TABLET    Chew 2 tablets by mouth 3 (three) times daily as needed for heartburn (after meals if needed for heartburn).    CLONIDINE (CATAPRES) 0.1 MG TABLET    Take 0.1 mg by mouth. Take one tablet as needed for bp > 160/100   DICLOFENAC SODIUM (VOLTAREN) 1 % GEL    Apply 1 g topically 4 (four) times daily as needed (knees).    DOXAZOSIN (CARDURA) 8 MG TABLET    Take 8 mg by mouth daily. To control BP.   DOXEPIN (SINEQUAN) 10 MG CAPSULE    Take 10 mg by mouth at bedtime as needed (for rest).    FUROSEMIDE (LASIX) 40 MG TABLET    Take 40 mg by mouth daily. Take one daily   GLIPIZIDE (GLUCOTROL) 10 MG TABLET    Take 10 mg by mouth 2 (two) times daily.   GLUCOSE BLOOD TEST STRIP    1 each by Other route daily. Use once daily to test blood sugar.   GUAIFENESIN (ROBITUSSIN) 100 MG/5ML LIQUID    Take 200 mg by mouth. Take 10 ml every 6 hours as needed for cough   INSULIN NPH HUMAN (HUMULIN N,NOVOLIN N) 100 UNIT/ML INJECTION    Inject into the skin. Inject 5 units with meals  for blood sugar > 350   LOSARTAN (COZAAR) 100 MG TABLET    Take 100 mg by mouth daily. Take one tablet daily for blood pressure   METFORMIN (GLUCOPHAGE) 500 MG TABLET    Take 1,000 mg by mouth 2 (two) times daily with a meal.   METOPROLOL TARTRATE (LOPRESSOR) 25 MG TABLET    One twice daily to control blood pressure   MULTIPLE VITAMIN (MULTIVITAMIN) CAPSULE    Take 1 capsule by mouth daily.   MYRBETRIQ 25 MG TB24 TABLET    Take one tablet daily for bladder control   OMEPRAZOLE (PRILOSEC) 20 MG CAPSULE       PREDNISONE (DELTASONE) 10 MG TABLET    Take 6 tablets (60 mg total) by mouth daily with breakfast. On 11/18/13 and decrease by 10mg  daily   PROMETHAZINE (PHENERGAN) 25 MG TABLET    One every 4 hours as needed for nausea   PROMETHAZINE (PHENERGAN) 25 MG/ML INJECTION    Inject 1 ml every 4 hours as needed for nausea if oral refused    SIMETHICONE (GAS RELIEF 80 PO)    Take 1 tablet by mouth daily.   Modified Medications   No medications on file  Discontinued Medications   ALBUTEROL SULFATE (PROAIR RESPICLICK) 638 (90 BASE) MCG/ACT AEPB    Inhale 2 puffs into the lungs every 6 (six) hours as needed.   DOXYCYCLINE (VIBRA-TABS) 100 MG TABLET    Take 1 tablet (100 mg total) by mouth 2 (two) times daily.   HYDRALAZINE (APRESOLINE) 25 MG TABLET    Take 1 tablet (25 mg total) by mouth 3 (three) times daily.   HYDROCORTISONE CREAM 1 %    Apply topically 2 (two) times daily. Apply to right upper arm/bicep area bid x one week   PREDNISONE (DELTASONE) 20 MG TABLET    One daily for the next 7 days, then resume 10 mg prednisone daily   SIMVASTATIN (ZOCOR) 20 MG TABLET    Take 20 mg by mouth daily.     Review of Systems  Constitutional: Negative for fever, chills and diaphoresis.       Baseline mobility is motorized scooter  HENT: Positive for hearing loss. Negative for congestion, ear discharge, ear pain, nosebleeds, sore throat and tinnitus.   Eyes: Negative for photophobia and pain.  Respiratory: Positive for cough, shortness of breath and wheezing. Negative for stridor.   Cardiovascular: Positive for leg swelling. Negative for chest pain and palpitations.       Trace. Right knee pain.   Gastrointestinal: Negative for nausea, vomiting, abdominal pain, diarrhea, constipation and blood in stool.  Endocrine: Negative for polydipsia.  Genitourinary: Positive for frequency. Negative for dysuria, urgency, hematuria and flank pain.       Recurrent urinary tract infections. Nocturia. Urinary frequency.  Musculoskeletal: Negative for myalgias, back pain and neck pain.       R knee pain  Skin: Negative for rash.       Healing wound of the left hand dorsum near the thumb where a lesion was removed by Dr. Redmond Pulling at the [place] [facility].  Allergic/Immunologic: Negative for environmental allergies.  Neurological: Positive  for weakness. Negative for dizziness, tremors, seizures and headaches.  Hematological: Does not bruise/bleed easily.  Psychiatric/Behavioral: Negative for suicidal ideas and hallucinations. The patient is not nervous/anxious.     Filed Vitals:   07/11/14 0924  BP: 136/60  Pulse: 88  Temp: 97.4 F (36.3 C)  TempSrc: Oral  Weight: 188 lb (  85.276 kg)  SpO2: 95%   Body mass index is 32.25 kg/(m^2).  Physical Exam  Constitutional: She is oriented to person, place, and time. She appears well-developed and well-nourished. No distress.  HENT:  Head: Normocephalic and atraumatic.  Right Ear: External ear normal.  Left Ear: External ear normal.  Nose: Nose normal.  Eyes: Conjunctivae and EOM are normal. Pupils are equal, round, and reactive to light.  Neck: Neck supple. No JVD present. No tracheal deviation present. No thyromegaly present.  Cardiovascular: Normal rate, regular rhythm, normal heart sounds and intact distal pulses.  Exam reveals no gallop and no friction rub.   No murmur heard. Pulmonary/Chest: No respiratory distress. She has wheezes. She has rales.  Abdominal: Bowel sounds are normal. She exhibits no distension and no mass. There is no tenderness.  Musculoskeletal: She exhibits edema (2-3+ bipedal) and tenderness.  Scars from bilateral TKR. Right knee seems to be loose when stressing the joint. No effusion. Instable gait. Driving a power scooter today. Trace edema BLE Chronic R knee pain  Lymphadenopathy:    She has no cervical adenopathy.  Neurological: She is alert and oriented to person, place, and time. She has normal reflexes. No cranial nerve deficit. Coordination normal.  Diminished vibratory and monofilament testing in both feet.  Skin: No rash (Reddened rash on both legs. Some itching and burning associated with the rash.) noted. No erythema. No pallor.  Prior exam: 6 mm nodule of the left posterior neck at the SCM muscle seems resolved. Unable to find  today. Likely SCC left leg medial to shin.  Psychiatric: She has a normal mood and affect. Her behavior is normal. Judgment and thought content normal.     Labs reviewed: Nursing Home on 07/11/2014  Component Date Value Ref Range Status  . Hgb A1c MFr Bld 03/09/2014 7.6* 4.0 - 6.0 % Final  Lab on 06/20/2014  Component Date Value Ref Range Status  . Hemoglobin 06/16/2014 13.4  12.0 - 16.0 g/dL Final  . HCT 06/16/2014 41  36 - 46 % Final  . Platelets 06/16/2014 214  150 - 399 K/L Final  . WBC 06/16/2014 10.1   Final  . BUN 06/16/2014 21  4 - 21 mg/dL Final  . Creatinine 06/16/2014 0.7  0.5 - 1.1 mg/dL Final  . Potassium 06/16/2014 3.9  3.4 - 5.3 mmol/L Final  . Sodium 06/16/2014 134* 137 - 147 mmol/L Final  Nursing Home on 06/06/2014  Component Date Value Ref Range Status  . Hemoglobin 03/09/2014 11.4* 12.0 - 16.0 g/dL Final  . HCT 03/09/2014 34* 36 - 46 % Final  . Platelets 03/09/2014 215  150 - 399 K/L Final  . WBC 03/09/2014 8.2   Final  . Glucose 03/09/2014 64   Final  . BUN 03/09/2014 21  4 - 21 mg/dL Final  . Creatinine 03/09/2014 0.7  0.5 - 1.1 mg/dL Final  . Potassium 03/09/2014 4.0  3.4 - 5.3 mmol/L Final  . Sodium 03/09/2014 137  137 - 147 mmol/L Final  . TSH 03/09/2014 7.60* 0.41 - 5.90 uIU/mL Final     Assessment/Plan  1. Urine, incontinence, stress female Change curvature to 50 mg daily  2. Acute upper respiratory infection Resolved  3. Anemia of chronic disease Resolved  4. Chronic obstructive pulmonary disease, unspecified COPD, unspecified chronic bronchitis type Reduce prednisone to 2.5 mg daily  5. Essential hypertension Controlled  6. Squamous cell carcinoma of skin of lower limb, including hip, left Resolved  7. Acute cystitis without  hematuria Resolved  8. Preventive measure - DNR (Do Not Resuscitate)

## 2014-07-20 ENCOUNTER — Other Ambulatory Visit: Payer: Self-pay | Admitting: *Deleted

## 2014-07-20 MED ORDER — GLUCOSE BLOOD VI STRP: ORAL_STRIP | Status: DC

## 2014-07-20 NOTE — Telephone Encounter (Signed)
[employer]

## 2014-07-21 ENCOUNTER — Other Ambulatory Visit: Payer: Self-pay | Admitting: *Deleted

## 2014-07-21 MED ORDER — GLUCOSE BLOOD VI STRP: ORAL_STRIP | Status: DC

## 2014-07-21 NOTE — Telephone Encounter (Signed)
[employer]

## 2014-07-31 ENCOUNTER — Encounter: Payer: Self-pay | Admitting: Pulmonary Disease

## 2014-08-10 ENCOUNTER — Telehealth: Payer: Self-pay | Admitting: Internal Medicine

## 2014-08-10 NOTE — Telephone Encounter (Signed)
A Handicap form was placed in Dr. Hervey Ard tray on 08/10/2014  for him to sign on [place] [facility].

## 2014-08-11 ENCOUNTER — Ambulatory Visit: Payer: Medicare Other | Admitting: Pulmonary Disease

## 2014-08-11 ENCOUNTER — Encounter: Payer: Self-pay | Admitting: Pulmonary Disease

## 2014-08-11 ENCOUNTER — Ambulatory Visit (INDEPENDENT_AMBULATORY_CARE_PROVIDER_SITE_OTHER): Payer: Medicare Other | Admitting: Pulmonary Disease

## 2014-08-11 VITALS — BP 144/68 | HR 72 | Ht 64.0 in | Wt 194.0 lb

## 2014-08-11 DIAGNOSIS — R0609 Other forms of dyspnea: Secondary | ICD-10-CM | POA: Diagnosis not present

## 2014-08-11 DIAGNOSIS — Z23 Encounter for immunization: Secondary | ICD-10-CM

## 2014-08-11 DIAGNOSIS — J432 Centrilobular emphysema: Secondary | ICD-10-CM | POA: Diagnosis not present

## 2014-08-11 DIAGNOSIS — R0600 Dyspnea, unspecified: Secondary | ICD-10-CM

## 2014-08-11 DIAGNOSIS — R918 Other nonspecific abnormal finding of lung field: Secondary | ICD-10-CM

## 2014-08-11 NOTE — Addendum Note (Signed)
Addended by: Len Blalock on: 08/11/2014 02:25 PM   Modules accepted: Orders

## 2014-08-11 NOTE — Assessment & Plan Note (Signed)
She has moderate airflow obstruction consistent with COPD. Despite her advanced age she actually seems remarkably healthy. She has shortness of breath when she exerts herself and we talked about this at length today. I explained to her that I think inhaled therapies would help her breathe better, but she is quite insistent that she does not want to take any more medications. Considering the fact that she's 89 think she should be allowed to determine her in destiny here.  Plan: I advised her to stay as active as possible Prevnar vaccine given today Follow-up 6 months or sooner if needed If she decides she wants to try inhaled therapies at be happy to provide them prior to the next visit.

## 2014-08-11 NOTE — Patient Instructions (Signed)
Stay active and exercise daily If you change your mind about taking inhalers let us know We will see you back in 6 months

## 2014-08-11 NOTE — Progress Notes (Signed)
Subjective:    Patient ID: [name], female    DOB: 05/06/1917, 81 y.o.   MRN: [id number]  Synopsis: Former [place] patient with bronchiectasis and COPD PFTs 2013:  FEV1 1.04 (78%), ratio 64, couldn't do maneuver for lung volumes or dlco 06/15/14 CXR stable in comparison with prior exam.  Pulmonary nodules felt to be Most likely secondary to [facility] HPI Chief Complaint  Patient presents with  . Follow-up    old [place] pt- c/o sob/wheezing with exertion.     [name] says she has been doing OK lately.  She has some shortness of breaht which she says isn't too bad.  She says that whenever she gets up to walk around makes her dyspneic.  She typically has to walk as far as 100 yards or so before she gets dyspneic or climbing hills.  She produces clear mucus each morning.  She has not had to be treated for bronchitis since having pneumonia last fall.   She has not tried anything as far as daily inhaled medications for COPD.  She tried albuterol once and it didn't help.   Past Medical History  Diagnosis Date  . Type II or unspecified type diabetes mellitus without mention of complication, not stated as uncontrolled   . Hypertension   . Arthritis   . COPD (chronic obstructive pulmonary disease)   . Regional enteritis of unspecified site   . Other malaise and fatigue   . Anemia, unspecified   . Other and unspecified hyperlipidemia   . Hypoxemia   . Pneumonia, organism unspecified   . Mucopurulent chronic bronchitis   . Palpitations   . Nontoxic uninodular goiter   . Abnormality of gait   . Fall from other slipping, tripping, or stumbling   . Cholelithiases   . Closed fracture of unspecified part of ramus of mandible   . Disturbance of skin sensation   . Sciatica   . Tension headache   . Coronary atherosclerosis of unspecified type of vessel, native or graft   . Osteoarthrosis, unspecified whether generalized or localized, unspecified site   . Insomnia, unspecified   . Edema   . Tension  headache   . Pain in joint, lower leg 2010    right knee  . Unspecified venous (peripheral) insufficiency   . Esophageal reflux   . Rosacea   . Cataract 1990    Dr. Katy Fitch  . Pain in joint, shoulder region 2013    left  . Gastric ulcer with hemorrhage 2008  . Urine, incontinence, stress female 07/19/2013  . Bronchiectasis without acute exacerbation 04/10/2011    CT chest 2011   . DOE (dyspnea on exertion) 04/10/2011    PFTs 2013:  FEV1 1.04 (78%), ratio 64, couldn't do maneuver for lung volumes or dlco   . Nodule of neck 11/07/2013    Left neck posteriorly   . Squamous cell carcinoma of skin of lower limb, including hip 07/27/2012    Nonhealing lesion of the left mid calf medially       Review of Systems     Objective:   Physical Exam Filed Vitals:   08/11/14 1345  BP: 144/68  Pulse: 72  Height: 5\' 4"  (1.626 m)  Weight: 194 lb (87.998 kg)  SpO2: 93%  RA  Gen: elderly but well appearing HENT: OP clear, neck supple PULM: CTA B, normal effort CV: RRR, no mgr, trace edema GI: BS+, soft, nontender Derm: no cyanosis or rash Psyche: normal mood and affect  Assessment & Plan:  COPD (chronic obstructive pulmonary disease) She has moderate airflow obstruction consistent with COPD. Despite her advanced age she actually seems remarkably healthy. She has shortness of breath when she exerts herself and we talked about this at length today. I explained to her that I think inhaled therapies would help her breathe better, but she is quite insistent that she does not want to take any more medications. Considering the fact that she's 89 think she should be allowed to determine her in destiny here.  Plan: I advised her to stay as active as possible Prevnar vaccine given today Follow-up 6 months or sooner if needed If she decides she wants to try inhaled therapies at be happy to provide them prior to the next visit.     Current outpatient prescriptions:  .  acetaminophen  (TYLENOL) 500 MG tablet, Take 500 mg by mouth. Take one tablet twice daily, Disp: , Rfl:  .  amLODipine (NORVASC) 5 MG tablet, Take 10 mg by mouth daily. , Disp: , Rfl:  .  atorvastatin (LIPITOR) 10 MG tablet, Take 10 mg by mouth daily., Disp: , Rfl:  .  calcium carbonate (TUMS EX) 750 MG chewable tablet, Chew 2 tablets by mouth 3 (three) times daily as needed for heartburn (after meals if needed for heartburn). , Disp: , Rfl:  .  cetirizine (ZYRTEC) 10 MG tablet, Take 10 mg by mouth daily., Disp: , Rfl:  .  cloNIDine (CATAPRES) 0.1 MG tablet, Take 0.1 mg by mouth. Take one tablet as needed for bp > 160/100, Disp: , Rfl:  .  diclofenac sodium (VOLTAREN) 1 % GEL, Apply 1 g topically 4 (four) times daily as needed (knees). , Disp: , Rfl:  .  doxazosin (CARDURA) 8 MG tablet, Take 8 mg by mouth daily. To control BP., Disp: , Rfl:  .  doxepin (SINEQUAN) 10 MG capsule, Take 10 mg by mouth at bedtime as needed (for rest). , Disp: , Rfl:  .  furosemide (LASIX) 40 MG tablet, Take 40 mg by mouth daily. Take one daily, Disp: , Rfl:  .  glipiZIDE (GLUCOTROL) 10 MG tablet, Take 10 mg by mouth 2 (two) times daily., Disp: , Rfl:  .  glucose blood test strip, Use once daily to test blood sugar. Contour Test Strips. Dx:E11.9, Disp: 100 each, Rfl: 11 .  guaiFENesin (ROBITUSSIN) 100 MG/5ML liquid, Take 200 mg by mouth. Take 10 ml every 6 hours as needed for cough, Disp: , Rfl:  .  insulin NPH Human (HUMULIN N,NOVOLIN N) 100 UNIT/ML injection, Inject into the skin. Inject 5 units with meals for blood sugar > 350, Disp: , Rfl:  .  losartan (COZAAR) 100 MG tablet, Take 100 mg by mouth daily. Take one tablet daily for blood pressure, Disp: , Rfl:  .  metFORMIN (GLUCOPHAGE) 500 MG tablet, Take 1,000 mg by mouth 2 (two) times daily with a meal., Disp: , Rfl:  .  metoprolol tartrate (LOPRESSOR) 25 MG tablet, One twice daily to control blood pressure, Disp: 180 tablet, Rfl: 3 .  mirabegron ER (MYRBETRIQ) 50 MG TB24 tablet,  One daily to assist and bladder control, Disp: 30 tablet, Rfl: 5 .  omeprazole (PRILOSEC) 20 MG capsule, , Disp: , Rfl: 10 .  predniSONE (DELTASONE) 5 MG tablet, 1/2 tablet daily to help breathing (Patient taking differently: 5 mg. 1/2 tablet daily to help breathing), Disp: 15 tablet, Rfl: 5 .  promethazine (PHENERGAN) 25 MG tablet, One every 4 hours as needed for  nausea, Disp: , Rfl: 10 .  promethazine (PHENERGAN) 25 MG/ML injection, Inject 1 ml every 4 hours as needed for nausea if oral refused, Disp: , Rfl: 10 .  Simethicone (GAS RELIEF 80 PO), Take 1 tablet by mouth daily. , Disp: , Rfl:

## 2014-08-15 ENCOUNTER — Encounter: Payer: Self-pay | Admitting: Nurse Practitioner

## 2014-08-15 ENCOUNTER — Non-Acute Institutional Stay: Payer: Medicare Other | Admitting: Nurse Practitioner

## 2014-08-15 DIAGNOSIS — R609 Edema, unspecified: Secondary | ICD-10-CM | POA: Diagnosis not present

## 2014-08-15 DIAGNOSIS — I10 Essential (primary) hypertension: Secondary | ICD-10-CM | POA: Diagnosis not present

## 2014-08-15 DIAGNOSIS — J410 Simple chronic bronchitis: Secondary | ICD-10-CM

## 2014-08-15 DIAGNOSIS — E1159 Type 2 diabetes mellitus with other circulatory complications: Secondary | ICD-10-CM | POA: Diagnosis not present

## 2014-08-15 DIAGNOSIS — E1151 Type 2 diabetes mellitus with diabetic peripheral angiopathy without gangrene: Secondary | ICD-10-CM

## 2014-08-15 DIAGNOSIS — K219 Gastro-esophageal reflux disease without esophagitis: Secondary | ICD-10-CM

## 2014-08-15 DIAGNOSIS — N393 Stress incontinence (female) (male): Secondary | ICD-10-CM | POA: Diagnosis not present

## 2014-08-15 DIAGNOSIS — G4700 Insomnia, unspecified: Secondary | ICD-10-CM | POA: Diagnosis not present

## 2014-08-15 NOTE — Assessment & Plan Note (Signed)
Dc Myrbetriq per patient's request due to lack of efficacy.

## 2014-08-15 NOTE — Assessment & Plan Note (Signed)
w/ hx of bleeding ulcers, stable. Continue omeprazole and prn TUMs EX p meals prn.

## 2014-08-15 NOTE — Assessment & Plan Note (Addendum)
hyperglycemia likely due to steroids, A1c 7.2  - discontinue oral meds-Metformin 1000mg  bid and Glipizide 10mg  bid due to elevated CBG - Lantus 3 u daily, continue Novolog with meals. Continue to monitor CBGs, obtain CBC UA C/S

## 2014-08-15 NOTE — Assessment & Plan Note (Signed)
Continue norvasc 5mg  daily, Metoprolol 50mg  bid, Losartan 100mg ,  doxazosin 8mg , and prn Clonidine 0.1mg  fro Bp>160/100 - On lasix 60 mg daily since 08/15/14 due to increased edema and weight gain.  - Continue statin  - Bp controlled.

## 2014-08-15 NOTE — Assessment & Plan Note (Signed)
More pronounced, will increase Furosemide to 60mg  daily, update CMP and BNP

## 2014-08-15 NOTE — Assessment & Plan Note (Addendum)
06/15/14 CXR stable in comparison with prior exam.  - presently Prednisone 5 mg daily maintenance dose.  - Continue lasix 40mg  - ECHO--EF 27-67%, grade 2 diastolic dysfunction

## 2014-08-15 NOTE — Assessment & Plan Note (Signed)
Stable, continue Doxepin 10mg  qhs

## 2014-08-15 NOTE — Progress Notes (Signed)
Patient ID: [name], female   DOB: 1917/09/23, 81 y.o.   MRN: [id number]    Code Status: DNR  Allergies  Allergen Reactions  . Adhesive [Tape]     unknown  . Aspirin     unknown  . Codeine     unknown  . Enalapril Cough  . Hydrocodone     unknown  . Pantoprazole Sodium     unknown    Chief Complaint  Patient presents with  . Medical Management of Chronic Issues  . Acute Visit    weight gain, blood sugar, BLE edema    HPI: Patient is a 81 y.o. female seen in the AL at [facility] today for  evaluation of BLE edema L>R, weight gain, blood sugar, and other chronic medical conditions Problem List Items Addressed This Visit    COPD (chronic obstructive pulmonary disease) - Primary (Chronic)    06/15/14 CXR stable in comparison with prior exam.  - presently Prednisone 5 mg daily maintenance dose.  - Continue lasix 40mg  - ECHO--EF 18-84%, grade 2 diastolic dysfunction          Hypertension (Chronic)     Continue norvasc 5mg  daily, Metoprolol 50mg  bid, Losartan 100mg ,  doxazosin 8mg , and prn Clonidine 0.1mg  fro Bp>160/100 - On lasix 60 mg daily since 08/15/14 due to increased edema and weight gain.  - Continue statin  - Bp controlled.        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 Continue to monitor CBGs, obtain CBC UA C/S      Edema (Chronic)    More pronounced, will increase Furosemide to 60mg  daily, update CMP and BNP       Urine, incontinence, stress female (Chronic)    Dc Myrbetriq per patient's request due to lack of efficacy.       Esophageal reflux    w/ hx of bleeding ulcers, stable. Continue omeprazole and prn TUMs EX p meals prn.         Insomnia    Stable, continue Doxepin 10mg  qhs           Review of Systems:  Review of Systems    Constitutional: Positive for unexpected weight change. Negative for fever, chills and diaphoresis.       Weight gain #9Ibs in a month.  HENT: Positive for hearing loss. Negative for congestion, ear discharge, ear pain, nosebleeds, sore throat and tinnitus.   Eyes: Negative for photophobia, pain, discharge and redness.  Respiratory: Positive for cough and shortness of breath. Negative for wheezing and stridor.   Cardiovascular: Positive for leg swelling. Negative for chest pain and palpitations.       2+edema BLE L>R  Gastrointestinal: Positive for nausea and vomiting. Negative for abdominal pain, diarrhea, constipation and blood in stool.  Endocrine: Negative for polydipsia.  Genitourinary: Positive for frequency. Negative for dysuria, urgency, hematuria and flank pain.       Leaking urine when stands up  Musculoskeletal: Negative for myalgias, back pain and neck pain.  Skin: Negative for rash.  Allergic/Immunologic: Negative for environmental allergies.  Neurological: Negative for dizziness, tremors, seizures, weakness and headaches.  Hematological: Does not bruise/bleed easily.  Psychiatric/Behavioral: Negative for hallucinations. The patient is not nervous/anxious.      Past Medical History  Diagnosis Date  .  Type II or unspecified type diabetes mellitus without mention of complication, not stated as uncontrolled   . Hypertension   . Arthritis   . COPD (chronic obstructive pulmonary disease)   . Regional enteritis of unspecified site   . Other malaise and fatigue   . Anemia, unspecified   . Other and unspecified hyperlipidemia   . Hypoxemia   . Pneumonia, organism unspecified   . Mucopurulent chronic bronchitis   . Palpitations   . Nontoxic uninodular goiter   . Abnormality of gait   . Fall from other slipping, tripping, or stumbling   . Cholelithiases   . Closed fracture of unspecified part of ramus of mandible   . Disturbance of skin sensation   . Sciatica   . Tension  headache   . Coronary atherosclerosis of unspecified type of vessel, native or graft   . Osteoarthrosis, unspecified whether generalized or localized, unspecified site   . Insomnia, unspecified   . Edema   . Tension headache   . Pain in joint, lower leg 2010    right knee  . Unspecified venous (peripheral) insufficiency   . Esophageal reflux   . Rosacea   . Cataract 1990    Dr. Katy Fitch  . Pain in joint, shoulder region 2013    left  . Gastric ulcer with hemorrhage 2008  . Urine, incontinence, stress female 07/19/2013  . Bronchiectasis without acute exacerbation 04/10/2011    CT chest 2011   . DOE (dyspnea on exertion) 04/10/2011    PFTs 2013:  FEV1 1.04 (78%), ratio 64, couldn't do maneuver for lung volumes or dlco   . Nodule of neck 11/07/2013    Left neck posteriorly   . Squamous cell carcinoma of skin of lower limb, including hip 07/27/2012    Nonhealing lesion of the left mid calf medially    Past Surgical History  Procedure Laterality Date  . Total knee arthroplasty Bilateral 1993, 1998    knees  . Other surgical history  2008    Ulcer surgery   . Small intestine surgery    . Eye surgery Bilateral 1990    Dr. Katy Fitch  . Gastrojejunostomy  05/2004    secondary to ulcer  . Joint replacement Bilateral 1993-1998    total knee replacement  . Mole removal  05/11/14    left hand and left side of face [facility]   Social History:   reports that she quit smoking about 41 years ago. Her smoking use included Cigarettes. She has a 3 pack-year smoking history. She has never used smokeless tobacco. She reports that she does not drink alcohol or use illicit drugs.  Family History  Problem Relation Age of Onset  . Heart disease Father   . Colon cancer Sister   . Diabetes Brother   . Obesity Brother   . Mental retardation Daughter   . Obesity Daughter     Medications: Patient's Medications  New Prescriptions   No medications on file  Previous Medications   ACETAMINOPHEN  (TYLENOL) 500 MG TABLET    Take 500 mg by mouth. Take one tablet twice daily   AMLODIPINE (NORVASC) 5 MG TABLET    Take 10 mg by mouth daily.    ATORVASTATIN (LIPITOR) 10 MG TABLET    Take 10 mg by mouth daily.   CALCIUM CARBONATE (TUMS EX) 750 MG CHEWABLE TABLET    Chew 2 tablets by mouth 3 (three) times daily as needed for heartburn (after meals if needed for heartburn).  CETIRIZINE (ZYRTEC) 10 MG TABLET    Take 10 mg by mouth daily.   CLONIDINE (CATAPRES) 0.1 MG TABLET    Take 0.1 mg by mouth. Take one tablet as needed for bp > 160/100   DICLOFENAC SODIUM (VOLTAREN) 1 % GEL    Apply 1 g topically 4 (four) times daily as needed (knees).    DOXAZOSIN (CARDURA) 8 MG TABLET    Take 8 mg by mouth daily. To control BP.   DOXEPIN (SINEQUAN) 10 MG CAPSULE    Take 10 mg by mouth at bedtime as needed (for rest).    FUROSEMIDE (LASIX) 40 MG TABLET    Take 40 mg by mouth daily. Take one daily   GLIPIZIDE (GLUCOTROL) 10 MG TABLET    Take 10 mg by mouth 2 (two) times daily.   GLUCOSE BLOOD TEST STRIP    Use once daily to test blood sugar. Contour Test Strips. Dx:E11.9   GUAIFENESIN (ROBITUSSIN) 100 MG/5ML LIQUID    Take 200 mg by mouth. Take 10 ml every 6 hours as needed for cough   INSULIN NPH HUMAN (HUMULIN N,NOVOLIN N) 100 UNIT/ML INJECTION    Inject into the skin. Inject 5 units with meals for blood sugar > 350   LOSARTAN (COZAAR) 100 MG TABLET    Take 100 mg by mouth daily. Take one tablet daily for blood pressure   METFORMIN (GLUCOPHAGE) 500 MG TABLET    Take 1,000 mg by mouth 2 (two) times daily with a meal.   METOPROLOL TARTRATE (LOPRESSOR) 25 MG TABLET    One twice daily to control blood pressure   MIRABEGRON ER (MYRBETRIQ) 50 MG TB24 TABLET    One daily to assist and bladder control   OMEPRAZOLE (PRILOSEC) 20 MG CAPSULE       PREDNISONE (DELTASONE) 5 MG TABLET    1/2 tablet daily to help breathing   PROMETHAZINE (PHENERGAN) 25 MG TABLET    One every 4 hours as needed for nausea   PROMETHAZINE  (PHENERGAN) 25 MG/ML INJECTION    Inject 1 ml every 4 hours as needed for nausea if oral refused   SIMETHICONE (GAS RELIEF 80 PO)    Take 1 tablet by mouth daily.   Modified Medications   No medications on file  Discontinued Medications   No medications on file     Physical Exam: Physical Exam  Constitutional: She is oriented to person, place, and time. She appears well-developed and well-nourished. No distress.  HENT:  Head: Normocephalic and atraumatic.  Right Ear: External ear normal.  Left Ear: External ear normal.  Nose: Nose normal.  Eyes: Conjunctivae and EOM are normal. Pupils are equal, round, and reactive to light.  Neck: Neck supple. No JVD present. No tracheal deviation present. No thyromegaly present.  Cardiovascular: Normal rate, regular rhythm, normal heart sounds and intact distal pulses.  Exam reveals no gallop and no friction rub.   No murmur heard. Pulmonary/Chest: No respiratory distress. She has no wheezes. She has rales.  Abdominal: Bowel sounds are normal. She exhibits no distension and no mass. There is no tenderness.  Musculoskeletal: She exhibits edema (2-3+ bipedal) and tenderness.  Scars from bilateral TKR. Right knee seems to be loose when stressing the joint. No effusion. Instable gait. Driving a power scooter 2+ edema BLE L>R Chronic R knee pain  Lymphadenopathy:    She has no cervical adenopathy.  Neurological: She is alert and oriented to person, place, and time. She has normal reflexes. No cranial nerve  deficit. Coordination normal.  Diminished vibratory and monofilament testing in both feet.  Skin: No rash (Reddened rash on both legs. Some itching and burning associated with the rash.) noted. No erythema. No pallor.  Prior exam: 6 mm nodule of the left posterior neck at the SCM muscle. Not tender.  Psychiatric: She has a normal mood and affect. Her behavior is normal. Judgment and thought content normal.    There were no vitals filed for this  visit.    Labs reviewed: Basic Metabolic Panel:  Recent Labs  10/27/13  11/15/13 0458 11/16/13 0432 11/17/13 0439 03/09/14 06/16/14  NA 139  < > 138 141 140 137 134*  K 4.5  < > 3.5* 3.3* 3.2* 4.0 3.9  CL  --   < > 100 100 98  --   --   CO2  --   < > 26 29 33*  --   --   GLUCOSE  --   < > 249* 161* 126*  --   --   BUN 15  < > 19 21 18 21 21   CREATININE 0.6  < > 0.62 0.67 0.59 0.7 0.7  CALCIUM  --   < > 8.3* 8.3* 8.2*  --   --   TSH 2.42  --   --   --   --  7.60*  --   < > = values in this interval not displayed. Liver Function Tests:  Recent Labs  08/25/13 10/27/13 11/12/13 0840  AST 15 17 15   ALT 11 13 11   ALKPHOS 90 124 85  BILITOT  --   --  0.5  PROT  --   --  6.8  ALBUMIN  --   --  3.5   No results for input(s): LIPASE, AMYLASE in the last 8760 hours. No results for input(s): AMMONIA in the last 8760 hours. CBC:  Recent Labs  08/25/13 11/12/13 0840  11/15/13 0458 11/16/13 0432 11/17/13 0439 03/09/14 06/16/14  WBC 7.3 22.1*  < > 14.2* 14.1* 13.3* 8.2 10.1  NEUTROABS 5 20.1*  --   --   --   --   --   --   HGB 13.3 12.1  < > 10.8* 11.5* 11.6* 11.4* 13.4  HCT 40 37.0  < > 32.7* 35.2* 35.4* 34* 41  MCV  --  95.4  < > 92.6 94.6 94.1  --   --   PLT 267 179  < > 208 207 222 215 214  < > = values in this interval not displayed. Lipid Panel:  Recent Labs  08/25/13 10/27/13  CHOL 123 108  HDL 54 54  LDLCALC 26 33  TRIG 215* 103    Past Procedures:  11/14/13 CXR  IMPRESSION: Persistent left mid lung consolidation, consistent with pneumonia. Increasing, diffuse bilateral interstitial opacities could reflect interstitial pneumonitis or edema.  06/15/14 CXR findings are stable in comparison with prior exam. Mild cardiomegaly. Mild osteoporosis. Moderate osteoarthritis.  Assessment/Plan COPD (chronic obstructive pulmonary disease) 06/15/14 CXR stable in comparison with prior exam.  - presently Prednisone 5 mg daily maintenance dose.  - Continue lasix  40mg  - ECHO--EF 59-93%, grade 2 diastolic dysfunction      Hypertension  Continue norvasc 5mg  daily, Metoprolol 50mg  bid, Losartan 100mg ,  doxazosin 8mg , and prn Clonidine 0.1mg  fro Bp>160/100 - On lasix 60 mg daily since 08/15/14 due to increased edema and weight gain.  - Continue statin  - Bp controlled.    Type 2 diabetes with decreased circulation hyperglycemia  likely due to steroids, A1c 7.2  - discontinue oral meds-Metformin 1000mg  bid and Glipizide 10mg  bid due to elevated CBG - Lantus 3 u daily, continue Novolog with meals. Continue to monitor CBGs, obtain CBC UA C/S  Edema More pronounced, will increase Furosemide to 60mg  daily, update CMP and BNP   Urine, incontinence, stress female Dc Myrbetriq per patient's request due to lack of efficacy.   Esophageal reflux w/ hx of bleeding ulcers, stable. Continue omeprazole and prn TUMs EX p meals prn.     Insomnia Stable, continue Doxepin 10mg  qhs      Family/ Staff Communication: observe the patient.   Goals of Care: AL  Labs/tests ordered: US venous LLE, CBC, CMP, UA C/S, BNP

## 2014-08-16 LAB — BASIC METABOLIC PANEL
BUN: 22 mg/dL — AB (ref 4–21)
Creatinine: 0.8 mg/dL (ref 0.5–1.1)
GLUCOSE: 83 mg/dL
POTASSIUM: 4.7 mmol/L (ref 3.4–5.3)
Sodium: 139 mmol/L (ref 137–147)

## 2014-08-16 LAB — HEPATIC FUNCTION PANEL
ALK PHOS: 72 U/L (ref 25–125)
ALT: 11 U/L (ref 7–35)
AST: 15 U/L (ref 13–35)
BILIRUBIN, TOTAL: 0.3 mg/dL

## 2014-08-16 LAB — CBC AND DIFFERENTIAL
HCT: 37 % (ref 36–46)
Hemoglobin: 12.1 g/dL (ref 12.0–16.0)
Platelets: 210 10*3/uL (ref 150–399)
WBC: 8.6 10*3/mL

## 2014-08-18 ENCOUNTER — Other Ambulatory Visit: Payer: Self-pay | Admitting: Nurse Practitioner

## 2014-08-25 ENCOUNTER — Non-Acute Institutional Stay (SKILLED_NURSING_FACILITY): Payer: Medicare Other | Admitting: Adult Health

## 2014-08-25 DIAGNOSIS — E1159 Type 2 diabetes mellitus with other circulatory complications: Secondary | ICD-10-CM

## 2014-08-25 DIAGNOSIS — L03116 Cellulitis of left lower limb: Secondary | ICD-10-CM

## 2014-08-25 DIAGNOSIS — E1151 Type 2 diabetes mellitus with diabetic peripheral angiopathy without gangrene: Secondary | ICD-10-CM

## 2014-08-28 ENCOUNTER — Encounter: Payer: Self-pay | Admitting: Adult Health

## 2014-08-28 DIAGNOSIS — L03116 Cellulitis of left lower limb: Secondary | ICD-10-CM | POA: Insufficient documentation

## 2014-08-28 NOTE — Progress Notes (Signed)
Patient ID: [name], female   DOB: 1917-08-09, 81 y.o.   MRN: [id number]    Facility: [facility]       Allergies  Allergen Reactions  . Adhesive [Tape]     unknown  . Aspirin     unknown  . Codeine     unknown  . Enalapril Cough  . Hydrocodone     unknown  . Pantoprazole Sodium     unknown    Chief Complaint  Patient presents with  . Acute Visit    left lower leg cellulitis and diabetes     HPI:  Her cbg's are elevated and not being controlled with her current regimen and will require further medication adjustment in order to better manage her disease state. Her left lower leg is inflamed and warm to touch; her doppler study was negative for dvt. There is concern from the nursing staff that she has cellulitis present.    Past Medical History  Diagnosis Date  . Type II or unspecified type diabetes mellitus without mention of complication, not stated as uncontrolled   . Hypertension   . Arthritis   . COPD (chronic obstructive pulmonary disease)   . Regional enteritis of unspecified site   . Other malaise and fatigue   . Anemia, unspecified   . Other and unspecified hyperlipidemia   . Hypoxemia   . Pneumonia, organism unspecified   . Mucopurulent chronic bronchitis   . Palpitations   . Nontoxic uninodular goiter   . Abnormality of gait   . Fall from other slipping, tripping, or stumbling   . Cholelithiases   . Closed fracture of unspecified part of ramus of mandible   . Disturbance of skin sensation   . Sciatica   . Tension headache   . Coronary atherosclerosis of unspecified type of vessel, native or graft   . Osteoarthrosis, unspecified whether generalized or localized, unspecified site   . Insomnia, unspecified   . Edema   . Tension headache   . Pain in joint, lower leg 2010    right knee  . Unspecified venous (peripheral) insufficiency   . Esophageal reflux   . Rosacea   . Cataract 1990    Dr. Katy Fitch  . Pain in joint, shoulder region  2013    left  . Gastric ulcer with hemorrhage 2008  . Urine, incontinence, stress female 07/19/2013  . Bronchiectasis without acute exacerbation 04/10/2011    CT chest 2011   . DOE (dyspnea on exertion) 04/10/2011    PFTs 2013:  FEV1 1.04 (78%), ratio 64, couldn't do maneuver for lung volumes or dlco   . Nodule of neck 11/07/2013    Left neck posteriorly   . Squamous cell carcinoma of skin of lower limb, including hip 07/27/2012    Nonhealing lesion of the left mid calf medially     Past Surgical History  Procedure Laterality Date  . Total knee arthroplasty Bilateral 1993, 1998    knees  . Other surgical history  2008    Ulcer surgery   . Small intestine surgery    . Eye surgery Bilateral 1990    Dr. Katy Fitch  . Gastrojejunostomy  05/2004    secondary to ulcer  . Joint replacement Bilateral 1993-1998    total knee replacement  . Mole removal  05/11/14    left hand and left side of face [facility]    VITAL SIGNS BP 148/80 mmHg  Pulse 68  Ht 5\' 4"  (1.626 m)  Wt 194 lb (87.998 kg)  BMI 33.28 kg/m2  SpO2 94%  Patient's Medications  New Prescriptions   No medications on file  Previous Medications   ACETAMINOPHEN (TYLENOL) 500 MG TABLET    Take 500 mg by mouth. Take one tablet twice daily   AMLODIPINE (NORVASC) 5 MG TABLET    Take 10 mg by mouth daily.    ATORVASTATIN (LIPITOR) 10 MG TABLET    Take 10 mg by mouth daily.   CALCIUM CARBONATE (TUMS EX) 750 MG CHEWABLE TABLET    Chew 2 tablets by mouth 3 (three) times daily as needed for heartburn (after meals if needed for heartburn).    CETIRIZINE (ZYRTEC) 10 MG TABLET    Take 10 mg by mouth daily.   CLONIDINE (CATAPRES) 0.1 MG TABLET    Take 0.1 mg by mouth. Take one tablet as needed for bp > 160/100   DOXAZOSIN (CARDURA) 8 MG TABLET    Take 8 mg by mouth daily. To control BP.   DOXEPIN (SINEQUAN) 10 MG CAPSULE    Take 10 mg by mouth at bedtime as needed (for rest).    FUROSEMIDE (LASIX) 40 MG TABLET    Take 60 mg by mouth  daily. Take one daily   GLIPIZIDE (GLUCOTROL) 10 MG TABLET    Take 5 mg by mouth daily.    GLUCOSE BLOOD TEST STRIP    Use once daily to test blood sugar. Contour Test Strips. Dx:E11.9   GUAIFENESIN (ROBITUSSIN) 100 MG/5ML LIQUID    Take 200 mg by mouth. Take 10 ml every 6 hours as needed for cough   INSULIN GLARGINE (LANTUS) 100 UNIT/ML INJECTION    Inject 3 Units into the skin at bedtime.   INSULIN REGULAR (NOVOLIN R,HUMULIN R) 100 UNITS/ML INJECTION    Inject 5 Units into the skin 3 (three) times daily before meals.   LOSARTAN (COZAAR) 100 MG TABLET    Take 100 mg by mouth daily. Take one tablet daily for blood pressure   METFORMIN (GLUCOPHAGE) 500 MG TABLET    Take 1,000 mg by mouth 2 (two) times daily with a meal.   METOPROLOL TARTRATE (LOPRESSOR) 25 MG TABLET    One twice daily to control blood pressure   MIRABEGRON ER (MYRBETRIQ) 50 MG TB24 TABLET    One daily to assist and bladder control   OMEPRAZOLE (PRILOSEC) 20 MG CAPSULE       PREDNISONE (DELTASONE) 5 MG TABLET    1/2 tablet daily to help breathing   PROMETHAZINE (PHENERGAN) 25 MG TABLET    One every 4 hours as needed for nausea   PROMETHAZINE (PHENERGAN) 25 MG/ML INJECTION    Inject 1 ml every 4 hours as needed for nausea if oral refused   SIMETHICONE (GAS RELIEF 80 PO)    Take 1 tablet by mouth daily.   Modified Medications   No medications on file  Discontinued Medications   DICLOFENAC SODIUM (VOLTAREN) 1 % GEL    Apply 1 g topically 4 (four) times daily as needed (knees).    INSULIN NPH HUMAN (HUMULIN N,NOVOLIN N) 100 UNIT/ML INJECTION    Inject into the skin. Inject 5 units with meals for blood sugar > 350     SIGNIFICANT DIAGNOSTIC EXAMS  08-15-14: left lower extremity doppler: negative for dvt  LABS REVIEWED:   08-15-14: wbc 8.6; hgb 12.1; hct 36.7; mcv 93.4; plt 210; glucose 83; bun 22; creat 0.8; k+4.7; na++139; liver normal albumin 3.3 08-24-14: wbc 9.6; hgb 12.5; hct  38.4; mcv 95.5; plt 198     Review of Systems   Constitutional: Negative for appetite change and fatigue.  HENT: Negative for congestion.   Respiratory: Negative for cough, chest tightness and shortness of breath.   Cardiovascular: Negative for chest pain, palpitations and leg swelling.  Gastrointestinal: Negative for nausea, abdominal pain, diarrhea and constipation.  Musculoskeletal: Negative for myalgias and arthralgias.  Skin: Negative for pallor.       Left lower leg is red and  to touch   Neurological: Negative for dizziness.  Psychiatric/Behavioral: The patient is not nervous/anxious.       Physical Exam  Constitutional: She is oriented to person, place, and time. No distress.  Eyes: Conjunctivae are normal.  Neck: Neck supple. No JVD present. No thyromegaly present.  Cardiovascular: Normal rate, regular rhythm and intact distal pulses.   Respiratory: Effort normal and breath sounds normal. No respiratory distress. She has no wheezes.  GI: Soft. Bowel sounds are normal. She exhibits no distension. There is no tenderness.  Musculoskeletal: She exhibits no edema.  Able to move all extremities   Lymphadenopathy:    She has no cervical adenopathy.  Neurological: She is alert and oriented to person, place, and time.  Skin: Skin is warm and dry. She is not diaphoretic.  Left lower leg is red hot to touch and inflamed   Psychiatric: She has a normal mood and affect.       ASSESSMENT/ PLAN:  1. Left lower extremity cellulitis: will being cipro 500 mg twice daily for 2 weeks with florastor twice dialy for one month and will monitor   2. Diabetes: is not adequately controlled; will stop the novolin R and will start novolog 5 units prior to meals with an additional 3 units for cbg >=150.  Will increase lantus to 10 units  Daily and will monitor her status.     Ok Edwards NP [facility]  Contact [phone] Monday through Friday 8am- 5pm  After hours call [phone]

## 2014-09-06 ENCOUNTER — Non-Acute Institutional Stay: Payer: Medicare Other | Admitting: Adult Health

## 2014-09-06 DIAGNOSIS — L03116 Cellulitis of left lower limb: Secondary | ICD-10-CM | POA: Diagnosis not present

## 2014-09-17 ENCOUNTER — Encounter: Payer: Self-pay | Admitting: Adult Health

## 2014-09-17 NOTE — Progress Notes (Signed)
Patient ID: [name], female   DOB: [birth year], 81 y.o.   MRN: [id number]   Facility:  [facility]        Allergies  Allergen Reactions  . Adhesive [Tape]     unknown  . Aspirin     unknown  . Codeine     unknown  . Enalapril Cough  . Hydrocodone     unknown  . Pantoprazole Sodium     unknown    Chief Complaint  Patient presents with  . Acute Visit    edema and left lower extremity cellulitis     HPI:  Staff reports that she has increased edema in her lower extremities. She has recently completed abt fo left lower extremity cellulitis but her leg remains inflamed. There are no reports of fever present. She is not complaining of pain present. She is willing to try ted hose.    Past Medical History  Diagnosis Date  . Type II or unspecified type diabetes mellitus without mention of complication, not stated as uncontrolled   . Hypertension   . Arthritis   . COPD (chronic obstructive pulmonary disease)   . Regional enteritis of unspecified site   . Other malaise and fatigue   . Anemia, unspecified   . Other and unspecified hyperlipidemia   . Hypoxemia   . Pneumonia, organism unspecified   . Mucopurulent chronic bronchitis   . Palpitations   . Nontoxic uninodular goiter   . Abnormality of gait   . Fall from other slipping, tripping, or stumbling   . Cholelithiases   . Closed fracture of unspecified part of ramus of mandible   . Disturbance of skin sensation   . Sciatica   . Tension headache   . Coronary atherosclerosis of unspecified type of vessel, native or graft   . Osteoarthrosis, unspecified whether generalized or localized, unspecified site   . Insomnia, unspecified   . Edema   . Tension headache   . Pain in joint, lower leg 2010    right knee  . Unspecified venous (peripheral) insufficiency   . Esophageal reflux   . Rosacea   . Cataract 1990    Dr. Katy Fitch  . Pain in joint, shoulder region 2013    left  . Gastric ulcer with hemorrhage  2008  . Urine, incontinence, stress female 07/19/2013  . Bronchiectasis without acute exacerbation 04/10/2011    CT chest 2011   . DOE (dyspnea on exertion) 04/10/2011    PFTs 2013:  FEV1 1.04 (78%), ratio 64, couldn't do maneuver for lung volumes or dlco   . Nodule of neck 11/07/2013    Left neck posteriorly   . Squamous cell carcinoma of skin of lower limb, including hip 07/27/2012    Nonhealing lesion of the left mid calf medially     Past Surgical History  Procedure Laterality Date  . Total knee arthroplasty Bilateral 1993, 1998    knees  . Other surgical history  2008    Ulcer surgery   . Small intestine surgery    . Eye surgery Bilateral 1990    Dr. Katy Fitch  . Gastrojejunostomy  05/2004    secondary to ulcer  . Joint replacement Bilateral 1993-1998    total knee replacement  . Mole removal  05/11/14    left hand and left side of face [facility]    VITAL SIGNS BP 143/74 mmHg  Pulse 74  Ht 5\' 4"  (1.626 m)  Wt 194 lb (87.998 kg)  BMI 33.28 kg/m2  Patient's Medications  New Prescriptions   No medications on file  Previous Medications   ACETAMINOPHEN (TYLENOL) 500 MG TABLET    Take 500 mg by mouth. Take one tablet twice daily   AMLODIPINE (NORVASC) 5 MG TABLET    Take 10 mg by mouth daily.    ATORVASTATIN (LIPITOR) 10 MG TABLET    Take 10 mg by mouth daily.   CALCIUM CARBONATE (TUMS EX) 750 MG CHEWABLE TABLET    Chew 2 tablets by mouth 3 (three) times daily as needed for heartburn (after meals if needed for heartburn).    CETIRIZINE (ZYRTEC) 10 MG TABLET    Take 10 mg by mouth daily.   CLONIDINE (CATAPRES) 0.1 MG TABLET    Take 0.1 mg by mouth. Take one tablet as needed for bp > 160/100   DOXAZOSIN (CARDURA) 8 MG TABLET    Take 8 mg by mouth daily. To control BP.   DOXEPIN (SINEQUAN) 10 MG CAPSULE    Take 10 mg by mouth at bedtime as needed (for rest).    FUROSEMIDE (LASIX) 40 MG TABLET    Take 60 mg by mouth daily. Take one daily   GLIPIZIDE (GLUCOTROL) 10 MG TABLET     Take 5 mg by mouth daily.    GLUCOSE BLOOD TEST STRIP    Use once daily to test blood sugar. Contour Test Strips. Dx:E11.9   GUAIFENESIN (ROBITUSSIN) 100 MG/5ML LIQUID    Take 200 mg by mouth. Take 10 ml every 6 hours as needed for cough   INSULIN GLARGINE (LANTUS) 100 UNIT/ML INJECTION    Inject 3 Units into the skin at bedtime.   INSULIN REGULAR (NOVOLIN R,HUMULIN R) 100 UNITS/ML INJECTION    Inject 5 Units into the skin 3 (three) times daily before meals.   LOSARTAN (COZAAR) 100 MG TABLET    Take 100 mg by mouth daily. Take one tablet daily for blood pressure   METFORMIN (GLUCOPHAGE) 500 MG TABLET    Take 1,000 mg by mouth 2 (two) times daily with a meal.   METOPROLOL TARTRATE (LOPRESSOR) 25 MG TABLET    One twice daily to control blood pressure   MIRABEGRON ER (MYRBETRIQ) 50 MG TB24 TABLET    One daily to assist and bladder control   OMEPRAZOLE (PRILOSEC) 20 MG CAPSULE       PREDNISONE (DELTASONE) 5 MG TABLET    1/2 tablet daily to help breathing   PROMETHAZINE (PHENERGAN) 25 MG TABLET    One every 4 hours as needed for nausea   PROMETHAZINE (PHENERGAN) 25 MG/ML INJECTION    Inject 1 ml every 4 hours as needed for nausea if oral refused   SIMETHICONE (GAS RELIEF 80 PO)    Take 1 tablet by mouth daily.   Modified Medications   No medications on file  Discontinued Medications   No medications on file     SIGNIFICANT DIAGNOSTIC EXAMS    Review of Systems  Constitutional: Negative for appetite change and fatigue.  HENT: Negative for congestion.   Respiratory: Negative for cough, chest tightness and shortness of breath.   Cardiovascular: Positive for leg swelling. Negative for chest pain and palpitations.  Gastrointestinal: Negative for nausea, abdominal pain, diarrhea and constipation.  Musculoskeletal: Negative for myalgias and arthralgias.  Skin: Negative for pallor.  Neurological: Negative for dizziness.  Psychiatric/Behavioral: The patient is not nervous/anxious.        Physical Exam  Constitutional: No distress.  Eyes: Conjunctivae are normal.  Neck: Neck supple. No JVD present. No thyromegaly present.  Cardiovascular: Normal rate, regular rhythm and intact distal pulses.   Respiratory: Effort normal and breath sounds normal. No respiratory distress. She has no wheezes.  GI: Soft. Bowel sounds are normal. She exhibits no distension. There is no tenderness.  Musculoskeletal: She exhibits no edema.  Able to move all extremities  Has lower extremity edema; with left >right   Lymphadenopathy:    She has no cervical adenopathy.  Neurological: She is alert.  Skin: Skin is warm and dry. She is not diaphoretic.  Left lower extremity is inflamed and red without open lesion present. There is tenderness present   Psychiatric: She has a normal mood and affect.       ASSESSMENT/ PLAN:  1. Left lower extremity cellulitis: will being doxycycline 100 mg twice diacyl for 2 weeks with florastor twice daily for one month. Will use ted hose daily for her edema and will monitor   Time spent with patient 40   minutes >50% time spent counseling; reviewing medical record; tests; labs; and developing future plan of care   Ok Edwards NP [facility]  Contact [phone] Monday through Friday 8am- 5pm  After hours call [phone]

## 2014-10-02 ENCOUNTER — Encounter: Payer: Self-pay | Admitting: Internal Medicine

## 2014-11-07 ENCOUNTER — Encounter: Payer: Self-pay | Admitting: Internal Medicine

## 2014-11-07 ENCOUNTER — Non-Acute Institutional Stay: Payer: Medicare Other | Admitting: Internal Medicine

## 2014-11-07 VITALS — BP 140/60 | HR 91 | Temp 97.6°F | Resp 24 | Ht 64.0 in | Wt 192.8 lb

## 2014-11-07 DIAGNOSIS — R609 Edema, unspecified: Secondary | ICD-10-CM | POA: Diagnosis not present

## 2014-11-07 DIAGNOSIS — E1151 Type 2 diabetes mellitus with diabetic peripheral angiopathy without gangrene: Secondary | ICD-10-CM

## 2014-11-07 DIAGNOSIS — J410 Simple chronic bronchitis: Secondary | ICD-10-CM

## 2014-11-07 DIAGNOSIS — I10 Essential (primary) hypertension: Secondary | ICD-10-CM

## 2014-11-07 NOTE — Progress Notes (Signed)
Patient ID: [name], female   DOB: 04-20-1917, 81 y.o.   MRN: [id number]    [place] Room Number: AL 08  Place of Service: Clinic (12)     Allergies  Allergen Reactions  . Adhesive [Tape]     unknown  . Aspirin     unknown  . Codeine     unknown  . Enalapril Cough  . Hydrocodone     unknown  . Pantoprazole Sodium     unknown    Chief Complaint  Patient presents with  . Medical Management of Chronic Issues    left leg painand sciactica, SOB     HPI:  Sciatica in the left leg for 3 weeks. Taking Physical therapy for ultrasound to leg now. One session this week.  Patient is concerned about cost of medications. She has spent over $400, with [facility] last month.  Edema, unspecified type - patient's cardiologist 09/11/2014. In order to treat her swollen legs, he discontinued amlodipine and then added furosemide 20 mg daily with spironolactone 25 mg daily. Losartan was decreased to 50 mg daily from 100 mg. Patient complains of urinary frequency stating that it requires her to go to the bathroom almost hourly in the daytime hours. It does not seem to be quite so bad at night.  Simple chronic bronchitis (HCC) - persistent cough with occasional sputum production. Afebrile.  Type 2 diabetes with decreased circulation (HCC) - controlled  Essential hypertension - controlled with ties blood pressure a systolic of 476. Many systolics are much lower in the 100-110 range. Cardura which has been used to control blood pressure may also have an effect on the bladder for loss of control.    Medications: Patient's Medications  New Prescriptions   No medications on file  Previous Medications   ACETAMINOPHEN (TYLENOL) 500 MG TABLET    Take 500 mg by mouth. Take one tablet twice daily   AMLODIPINE (NORVASC) 5 MG TABLET    Take 10 mg by mouth daily.    ATORVASTATIN (LIPITOR) 10 MG TABLET    Take 10 mg by mouth daily.   CALCIUM CARBONATE (TUMS EX) 750  MG CHEWABLE TABLET    Chew 2 tablets by mouth 3 (three) times daily as needed for heartburn (after meals if needed for heartburn).    CETIRIZINE (ZYRTEC) 10 MG TABLET    Take 10 mg by mouth daily.   CLONIDINE (CATAPRES) 0.1 MG TABLET    Take 0.1 mg by mouth. Take one tablet as needed for bp > 160/100   DOXAZOSIN (CARDURA) 8 MG TABLET    Take 8 mg by mouth daily. To control BP.   DOXEPIN (SINEQUAN) 10 MG CAPSULE    Take 10 mg by mouth at bedtime as needed (for rest).    FUROSEMIDE (LASIX) 40 MG TABLET    Take 60 mg by mouth daily. Take one daily   GLIPIZIDE (GLUCOTROL) 10 MG TABLET    Take 5 mg by mouth daily.    GLUCOSE BLOOD TEST STRIP    Use once daily to test blood sugar. Contour Test Strips. Dx:E11.9   GUAIFENESIN (ROBITUSSIN) 100 MG/5ML LIQUID    Take 200 mg by mouth. Take 10 ml every 6 hours as needed for cough   INSULIN GLARGINE (LANTUS) 100 UNIT/ML INJECTION    Inject 3 Units into the skin at bedtime.   INSULIN REGULAR (NOVOLIN R,HUMULIN R) 100 UNITS/ML INJECTION    Inject 5 Units into the skin 3 (  three) times daily before meals.   LOSARTAN (COZAAR) 100 MG TABLET    Take 100 mg by mouth daily. Take one tablet daily for blood pressure   METFORMIN (GLUCOPHAGE) 500 MG TABLET    Take 1,000 mg by mouth 2 (two) times daily with a meal.   METOPROLOL TARTRATE (LOPRESSOR) 25 MG TABLET    One twice daily to control blood pressure   MIRABEGRON ER (MYRBETRIQ) 50 MG TB24 TABLET    One daily to assist and bladder control   OMEPRAZOLE (PRILOSEC) 20 MG CAPSULE       PREDNISONE (DELTASONE) 5 MG TABLET    1/2 tablet daily to help breathing   SIMETHICONE (GAS RELIEF 80 PO)    Take 1 tablet by mouth daily.   Modified Medications   No medications on file  Discontinued Medications   PROMETHAZINE (PHENERGAN) 25 MG TABLET    One every 4 hours as needed for nausea   PROMETHAZINE (PHENERGAN) 25 MG/ML INJECTION    Inject 1 ml every 4 hours as needed for nausea if oral refused     Review of Systems    Constitutional: Negative for fever, chills and diaphoresis.       Baseline mobility is motorized scooter  HENT: Positive for hearing loss. Negative for congestion, ear discharge, ear pain, nosebleeds, sore throat and tinnitus.   Eyes: Negative for photophobia and pain.  Respiratory: Positive for cough, shortness of breath and wheezing. Negative for stridor.   Cardiovascular: Positive for leg swelling. Negative for chest pain and palpitations.       Trace. Right knee pain.   Gastrointestinal: Negative for nausea, vomiting, abdominal pain, diarrhea, constipation and blood in stool.  Endocrine: Negative for polydipsia.  Genitourinary: Positive for frequency. Negative for dysuria, urgency, hematuria and flank pain.       Recurrent urinary tract infections. Nocturia. Urinary frequency.  Musculoskeletal: Negative for myalgias, back pain and neck pain.       R knee pain  Skin: Negative for rash.       Healing wound of the left hand dorsum near the thumb where a lesion was removed by Dr. Redmond Pulling at the [facility].  Allergic/Immunologic: Negative for environmental allergies.  Neurological: Positive for weakness. Negative for dizziness, tremors, seizures and headaches.  Hematological: Does not bruise/bleed easily.  Psychiatric/Behavioral: Negative for suicidal ideas and hallucinations. The patient is not nervous/anxious.     Filed Vitals:   11/07/14 1056  BP: 140/60  Pulse: 91  Temp: 97.6 F (36.4 C)  TempSrc: Oral  Resp: 24  Height: '5\' 4"'  (1.626 m)  Weight: 192 lb 12.8 oz (87.454 kg)  SpO2: 94%   Body mass index is 33.08 kg/(m^2).  Physical Exam  Constitutional: She is oriented to person, place, and time. She appears well-developed and well-nourished. No distress.  HENT:  Head: Normocephalic and atraumatic.  Right Ear: External ear normal.  Left Ear: External ear normal.  Nose: Nose normal.  Eyes: Conjunctivae and EOM are normal. Pupils are equal, round, and  reactive to light.  Neck: Neck supple. No JVD present. No tracheal deviation present. No thyromegaly present.  Cardiovascular: Normal rate, regular rhythm, normal heart sounds and intact distal pulses.  Exam reveals no gallop and no friction rub.   No murmur heard. Pulmonary/Chest: No respiratory distress. She has wheezes. She has rales.  Abdominal: Bowel sounds are normal. She exhibits no distension and no mass. There is no tenderness.  Musculoskeletal: She exhibits edema (2-3+ bipedal) and  tenderness.  Scars from bilateral TKR. Right knee seems to be loose when stressing the joint. No effusion. Instable gait. Driving a power scooter today. Trace edema BLE Chronic R knee pain  Lymphadenopathy:    She has no cervical adenopathy.  Neurological: She is alert and oriented to person, place, and time. She has normal reflexes. No cranial nerve deficit. Coordination normal.  Diminished vibratory and monofilament testing in both feet.  Skin: Rash (Reddened rash on both legs. Some itching and burning associated with the rash.) noted. No erythema. No pallor.  Prior exam: 6 mm nodule of the left posterior neck at the SCM muscle seems resolved. Unable to find today. Likely SCC left leg medial to shin.  Psychiatric: She has a normal mood and affect. Her behavior is normal. Judgment and thought content normal.     Labs reviewed: Lab Summary Latest Ref Rng 08/16/2014 06/16/2014 03/09/2014 11/17/2013 11/16/2013 11/15/2013  Hemoglobin 12.0 - 16.0 g/dL 12.1 13.4 11.4(A) 11.6(L) 11.5(L) 10.8(L)  Hematocrit 36 - 46 % 37 41 34(A) 35.4(L) 35.2(L) 32.7(L)  White count - 8.6 10.1 8.2 13.3(H) 14.1(H) 14.2(H)  Platelet count 150 - 399 K/L 210 214 215 222 207 208  Sodium 137 - 147 mmol/L 139 134(A) 137 140 141 138  Potassium 3.4 - 5.3 mmol/L 4.7 3.9 4.0 3.2(L) 3.3(L) 3.5(L)  Calcium 8.4 - 10.5 mg/dL (None) (None) (None) 8.2(L) 8.3(L) 8.3(L)  Phosphorus - (None) (None) (None) (None) (None) (None)  Creatinine 0.5 -  1.1 mg/dL 0.8 0.7 0.7 0.59 0.67 0.62  AST 13 - 35 U/L 15 (None) (None) (None) (None) (None)  Alk Phos 25 - 125 U/L 72 (None) (None) (None) (None) (None)  Bilirubin - (None) (None) (None) (None) (None) (None)  Glucose - 83 (None) 64 126(H) 161(H) 249(H)  Cholesterol - (None) (None) (None) (None) (None) (None)  HDL cholesterol - (None) (None) (None) (None) (None) (None)  Triglycerides - (None) (None) (None) (None) (None) (None)  LDL Direct - (None) (None) (None) (None) (None) (None)  LDL Calc - (None) (None) (None) (None) (None) (None)  Total protein - (None) (None) (None) (None) (None) (None)  Albumin - (None) (None) (None) (None) (None) (None)   Lab Results  Component Value Date   TSH 7.60* 03/09/2014   Lab Results  Component Value Date   BUN 22* 08/16/2014   Lab Results  Component Value Date   HGBA1C 7.6* 03/09/2014       Assessment/Plan 1. Edema, unspecified type Continue furosemide and spironolactone  2. Simple chronic bronchitis (HCC) No change in medication  3. Type 2 diabetes with decreased circulation (HCC) No change in medication  4. Essential hypertension Remain off amlodipine. Discontinue Cardura. Continue losartan and metoprolol   Medications were reviewed today. Multiple drugs were discontinued. These include Cardura, Florastor, doxepin, Mi-acid, cetirizine, and Certo-Vite

## 2014-11-10 ENCOUNTER — Other Ambulatory Visit: Payer: Self-pay | Admitting: *Deleted

## 2014-11-10 MED ORDER — GLUCOSE BLOOD VI STRP: ORAL_STRIP | Status: DC

## 2014-11-10 NOTE — Telephone Encounter (Signed)
[facility] Fax order stating they need a hard script for patient's BS test strips

## 2014-11-14 ENCOUNTER — Non-Acute Institutional Stay: Payer: Medicare Other | Admitting: Nurse Practitioner

## 2014-11-14 DIAGNOSIS — R609 Edema, unspecified: Secondary | ICD-10-CM | POA: Diagnosis not present

## 2014-11-14 DIAGNOSIS — K219 Gastro-esophageal reflux disease without esophagitis: Secondary | ICD-10-CM

## 2014-11-14 DIAGNOSIS — G4700 Insomnia, unspecified: Secondary | ICD-10-CM | POA: Diagnosis not present

## 2014-11-14 DIAGNOSIS — I10 Essential (primary) hypertension: Secondary | ICD-10-CM

## 2014-11-14 DIAGNOSIS — J410 Simple chronic bronchitis: Secondary | ICD-10-CM | POA: Diagnosis not present

## 2014-11-14 DIAGNOSIS — E1151 Type 2 diabetes mellitus with diabetic peripheral angiopathy without gangrene: Secondary | ICD-10-CM

## 2014-11-14 NOTE — Assessment & Plan Note (Signed)
-   ncrease Coreg to 6.25mg  bid, continue Losartan 50mg  and prn Clonidine 0.1mg  fro Bp>160/100 - On lasix 20mg  daily, Spironolactone 25mg  daily, minimal BLE edema, stable.  - Continue statin  - Bp monitoring.

## 2014-11-14 NOTE — Progress Notes (Signed)
Patient ID: [name], female   DOB: 11-11-1917, 81 y.o.   MRN: [id number]  [facility]  [facility] Provider:  Marlana Latus NP  Code Status:  DNR Goals of care: Advanced Directive information    Chief Complaint  Patient presents with  . Medical Management of Chronic Issues  . Acute Visit    elevated blood pressure     HPI: Patient is a 82 y.o. female seen in the [facility] at [facility] today for evaluation of elevated blood pressure, recently she was switched from Bystolic to Coreg, discontinued Cardura to adjust Bp. Denied chest pain, pressure, palpitation, vision change, headaches, or SOB, but she admitted fatigue and feeling bad when Bp is elevated. Clonidine prn has been effective.   Review of Systems:  Review of Systems  Constitutional: Negative for fever, chills and diaphoresis.       Baseline mobility is motorized scooter  HENT: Positive for hearing loss. Negative for congestion, ear discharge, ear pain, nosebleeds and tinnitus.   Eyes: Negative for photophobia and pain.  Respiratory: Positive for cough and shortness of breath. Negative for sputum production and wheezing.   Cardiovascular: Positive for leg swelling. Negative for chest pain and palpitations.       Trace. Right knee pain.   Gastrointestinal: Negative for nausea, vomiting, abdominal pain, diarrhea, constipation and melena.  Genitourinary: Positive for frequency. Negative for dysuria and urgency.       Recurrent urinary tract infections. Nocturia. Urinary frequency.  Musculoskeletal: Negative for myalgias, back pain and neck pain.       R knee pain  Skin: Negative for rash.       Healing wound of the left hand dorsum near the thumb where a lesion was removed by Dr. Redmond Pulling at the [place] at [facility].  Neurological: Positive for weakness. Negative for dizziness, tremors, seizures and headaches.  Endo/Heme/Allergies: Negative for environmental allergies and polydipsia.  Psychiatric/Behavioral: Negative  for suicidal ideas and hallucinations. The patient is not nervous/anxious.     Past Medical History  Diagnosis Date  . Type II or unspecified type diabetes mellitus without mention of complication, not stated as uncontrolled   . Hypertension   . Arthritis   . COPD (chronic obstructive pulmonary disease) ([place])   . Regional enteritis of unspecified site   . Other malaise and fatigue   . Anemia, unspecified   . Other and unspecified hyperlipidemia   . Hypoxemia   . Pneumonia, organism unspecified   . Mucopurulent chronic bronchitis ([place])   . Palpitations   . Nontoxic uninodular goiter   . Abnormality of gait   . Fall from other slipping, tripping, or stumbling   . Cholelithiases   . Closed fracture of unspecified part of ramus of mandible   . Disturbance of skin sensation   . Sciatica   . Tension headache   . Coronary atherosclerosis of unspecified type of vessel, native or graft   . Osteoarthrosis, unspecified whether generalized or localized, unspecified site   . Insomnia, unspecified   . Edema   . Tension headache   . Pain in joint, lower leg 2010    right knee  . Unspecified venous (peripheral) insufficiency   . Esophageal reflux   . Rosacea   . Cataract 1990    Dr. Katy Fitch  . Pain in joint, shoulder region 2013    left  . Gastric ulcer with hemorrhage 2008  . Urine, incontinence, stress female 07/19/2013  . Bronchiectasis without acute exacerbation ([place]) 04/10/2011  CT chest 2011   . DOE (dyspnea on exertion) 04/10/2011    PFTs 2013:  FEV1 1.04 (78%), ratio 64, couldn't do maneuver for lung volumes or dlco   . Nodule of neck 11/07/2013    Left neck posteriorly   . Squamous cell carcinoma of skin of lower limb, including hip 07/27/2012    Nonhealing lesion of the left mid calf medially     Patient Active Problem List   Diagnosis Date Noted  . Infection of urinary tract 06/23/2014  . Acute upper respiratory infection 06/06/2014  . Weakness 05/22/2014  . Insomnia  11/21/2013  . Nodule of neck 11/07/2013  . Palpitations 09/13/2013  . Nocturia 09/13/2013  . Hyperlipidemia 07/19/2013  . Urine, incontinence, stress female 07/19/2013  . Type 2 diabetes with decreased circulation ([place]) 05/24/2013  . Edema 05/24/2013  . Esophageal reflux   . Pain in joint, shoulder region 07/27/2012  . Hypertension   . Arthritis   . Other malaise and fatigue   . Mucopurulent chronic bronchitis ([place])   . Pain in joint, lower leg   . COPD (chronic obstructive pulmonary disease) ([place]) 02/06/2012  . DOE (dyspnea on exertion) 04/10/2011  . Bronchiectasis without acute exacerbation ([place]) 04/10/2011  . Pulmonary nodules 04/10/2011    Allergies  Allergen Reactions  . Adhesive [Tape]     unknown  . Aspirin     unknown  . Codeine     unknown  . Enalapril Cough  . Hydrocodone     unknown  . Pantoprazole Sodium     unknown    Medications: Patient's Medications  New Prescriptions   No medications on file  Previous Medications   ACETAMINOPHEN (TYLENOL) 500 MG TABLET    Take 500 mg by mouth. Take one tablet twice daily   AMLODIPINE (NORVASC) 5 MG TABLET    Take 10 mg by mouth daily.    ATORVASTATIN (LIPITOR) 10 MG TABLET    Take 10 mg by mouth daily.   CALCIUM CARBONATE (TUMS EX) 750 MG CHEWABLE TABLET    Chew 2 tablets by mouth 3 (three) times daily as needed for heartburn (after meals if needed for heartburn).    CETIRIZINE (ZYRTEC) 10 MG TABLET    Take 10 mg by mouth daily.   CLONIDINE (CATAPRES) 0.1 MG TABLET    Take 0.1 mg by mouth. Take one tablet as needed for bp > 160/100   DOXAZOSIN (CARDURA) 8 MG TABLET    Take 8 mg by mouth daily. To control BP.   DOXEPIN (SINEQUAN) 10 MG CAPSULE    Take 10 mg by mouth at bedtime as needed (for rest).    FUROSEMIDE (LASIX) 40 MG TABLET    Take 60 mg by mouth daily. Take one daily   GLIPIZIDE (GLUCOTROL) 10 MG TABLET    Take 5 mg by mouth daily.    GLUCOSE BLOOD TEST STRIP    Use three time daily to test blood sugar.  Contour Test Strips. Dx:E11.9   GUAIFENESIN (ROBITUSSIN) 100 MG/5ML LIQUID    Take 200 mg by mouth. Take 10 ml every 6 hours as needed for cough   INSULIN GLARGINE (LANTUS) 100 UNIT/ML INJECTION    Inject 3 Units into the skin at bedtime.   INSULIN REGULAR (NOVOLIN R,HUMULIN R) 100 UNITS/ML INJECTION    Inject 5 Units into the skin 3 (three) times daily before meals.   LOSARTAN (COZAAR) 100 MG TABLET    Take 100 mg by mouth daily. Take one tablet daily  for blood pressure   METFORMIN (GLUCOPHAGE) 500 MG TABLET    Take 1,000 mg by mouth 2 (two) times daily with a meal.   METOPROLOL TARTRATE (LOPRESSOR) 25 MG TABLET    One twice daily to control blood pressure   MIRABEGRON ER (MYRBETRIQ) 50 MG TB24 TABLET    One daily to assist and bladder control   OMEPRAZOLE (PRILOSEC) 20 MG CAPSULE       PREDNISONE (DELTASONE) 5 MG TABLET    1/2 tablet daily to help breathing   SIMETHICONE (GAS RELIEF 80 PO)    Take 1 tablet by mouth daily.   Modified Medications   No medications on file  Discontinued Medications   No medications on file    Physical Exam: Filed Vitals:   11/14/14 1632  BP: 187/90  Pulse: 82  Temp: 98.4 F (36.9 C)  TempSrc: Tympanic  Resp: 18   There is no weight on file to calculate BMI.  Physical Exam  Constitutional: She is oriented to person, place, and time. She appears well-developed and well-nourished. No distress.  HENT:  Head: Normocephalic and atraumatic.  Right Ear: External ear normal.  Left Ear: External ear normal.  Nose: Nose normal.  Eyes: Conjunctivae and EOM are normal. Pupils are equal, round, and reactive to light.  Neck: Neck supple. No JVD present. No tracheal deviation present. No thyromegaly present.  Cardiovascular: Normal rate, regular rhythm, normal heart sounds and intact distal pulses.  Exam reveals no gallop and no friction rub.   No murmur heard. Pulmonary/Chest: No respiratory distress. She has wheezes. She has rales.  Abdominal: Bowel sounds  are normal. She exhibits no distension and no mass. There is no tenderness.  Musculoskeletal: She exhibits edema (2-3+ bipedal) and tenderness.  Scars from bilateral TKR. Right knee seems to be loose when stressing the joint. No effusion. Instable gait. Driving a power scooter today. Trace edema BLE Chronic R knee pain  Lymphadenopathy:    She has no cervical adenopathy.  Neurological: She is alert and oriented to person, place, and time. She has normal reflexes. No cranial nerve deficit. Coordination normal.  Diminished vibratory and monofilament testing in both feet.  Skin: No rash (Reddened rash on both legs. Some itching and burning associated with the rash.) noted. No erythema. No pallor.  Prior exam: 6 mm nodule of the left posterior neck at the SCM muscle seems resolved Likely SCC left leg medial to shin.  Psychiatric: She has a normal mood and affect. Her behavior is normal. Judgment and thought content normal.    Labs reviewed: Basic Metabolic Panel:  Recent Labs  11/15/13 0458 11/16/13 0432 11/17/13 0439 03/09/14 06/16/14 08/16/14  NA 138 141 140 137 134* 139  K 3.5* 3.3* 3.2* 4.0 3.9 4.7  CL 100 100 98  --   --   --   CO2 26 29 33*  --   --   --   GLUCOSE 249* 161* 126*  --   --   --   BUN 19 21 18 21 21  22*  CREATININE 0.62 0.67 0.59 0.7 0.7 0.8  CALCIUM 8.3* 8.3* 8.2*  --   --   --     Liver Function Tests:  Recent Labs  08/16/14  AST 15  ALT 11  ALKPHOS 72    CBC:  Recent Labs  11/15/13 0458 11/16/13 0432 11/17/13 0439 03/09/14 06/16/14 08/16/14  WBC 14.2* 14.1* 13.3* 8.2 10.1 8.6  HGB 10.8* 11.5* 11.6* 11.4* 13.4 12.1  HCT  32.7* 35.2* 35.4* 34* 41 37  MCV 92.6 94.6 94.1  --   --   --   PLT 208 207 222 215 214 210    Lab Results  Component Value Date   TSH 7.60* 03/09/2014   Lab Results  Component Value Date   HGBA1C 7.6* 03/09/2014   Lab Results  Component Value Date   CHOL 108 10/27/2013   HDL 54 10/27/2013   LDLCALC 33 10/27/2013     TRIG 103 10/27/2013    Significant Diagnostic Results since last visit: none  Patient Care Team: Estill Dooms, MD as PCP - General (Internal Medicine) Clent Jacks, MD (Ophthalmology) Adrian Prows, MD as Attending Physician (Cardiology) [facility] [name], MD as Consulting Physician (Orthopedic Surgery) Kathee Delton, MD as Consulting Physician (Pulmonary Disease)  Assessment/Plan Problem List Items Addressed This Visit    Type 2 diabetes with decreased circulation (HCC) (Chronic)    Continue Lantus 10 u daily, Humatolog 5u with meals.       Insomnia    Stable, continue Doxepin 10mg  qhs       Hypertension - Primary (Chronic)    - ncrease Coreg to 6.25mg  bid, continue Losartan 50mg  and prn Clonidine 0.1mg  fro Bp>160/100 - On lasix 20mg  daily, Spironolactone 25mg  daily, minimal BLE edema, stable.  - Continue statin  - Bp monitoring.       Esophageal reflux    w/ hx of bleeding ulcers, stable. Continue omeprazole and prn TUMs EX p meals prn.       Edema (Chronic)    Trace BLE,  Continue Furosemide 20mg  and Spironolactone 25mg  daily      COPD (chronic obstructive pulmonary disease) (HCC) (Chronic)    06/15/14 CXR stable in comparison with prior exam.  - presently Prednisone 5 mg daily maintenance dose.  - ECHO--EF 59-45%, grade 2 diastolic dysfunction            Family/ staff Communication: continue to monitor BP and pulse.   Labs/tests ordered: none  ManXie Ryin Ambrosius NP [facility] [address], [place] [address] On Call:  [phone] & follow prompts after 5pm & weekends Office Phone:  [phone] Office Fax:  [phone]

## 2014-11-14 NOTE — Assessment & Plan Note (Signed)
Continue Lantus 10 u daily, Humatolog 5u with meals.

## 2014-11-14 NOTE — Assessment & Plan Note (Signed)
Trace BLE,  Continue Furosemide 20mg  and Spironolactone 25mg  daily

## 2014-11-14 NOTE — Assessment & Plan Note (Signed)
06/15/14 CXR stable in comparison with prior exam.  - presently Prednisone 5 mg daily maintenance dose.  - ECHO--EF 54-00%, grade 2 diastolic dysfunction

## 2014-11-14 NOTE — Assessment & Plan Note (Signed)
w/ hx of bleeding ulcers, stable. Continue omeprazole and prn TUMs EX p meals prn.     

## 2014-11-14 NOTE — Assessment & Plan Note (Signed)
Stable, continue Doxepin 10mg qhs    

## 2014-11-17 ENCOUNTER — Non-Acute Institutional Stay: Payer: Medicare Other | Admitting: Nurse Practitioner

## 2014-11-17 ENCOUNTER — Encounter: Payer: Self-pay | Admitting: Nurse Practitioner

## 2014-11-17 DIAGNOSIS — I10 Essential (primary) hypertension: Secondary | ICD-10-CM | POA: Diagnosis not present

## 2014-11-17 DIAGNOSIS — J410 Simple chronic bronchitis: Secondary | ICD-10-CM | POA: Diagnosis not present

## 2014-11-17 DIAGNOSIS — J411 Mucopurulent chronic bronchitis: Secondary | ICD-10-CM

## 2014-11-17 DIAGNOSIS — E1151 Type 2 diabetes mellitus with diabetic peripheral angiopathy without gangrene: Secondary | ICD-10-CM

## 2014-11-17 DIAGNOSIS — R601 Generalized edema: Secondary | ICD-10-CM

## 2014-11-17 NOTE — Assessment & Plan Note (Signed)
06/15/14 CXR stable in comparison with prior exam.  - presently Prednisone 5 mg daily maintenance dose.  - ECHO--EF 16-83%, grade 2 diastolic dysfunction  - Prn Neb available to her

## 2014-11-17 NOTE — Assessment & Plan Note (Signed)
No wheezes auscultated, will have prn Neb available to her.

## 2014-11-17 NOTE — Assessment & Plan Note (Signed)
Trace BLE,  Continue Furosemide 20mg  and Spironolactone 25mg  daily

## 2014-11-17 NOTE — Assessment & Plan Note (Signed)
-   resume Cardura 8mg  daily, continue Coreg 6.25mg  bid, Losartan 50mg  and prn Clonidine 0.1mg  fro Bp>160/100 - On lasix 20mg  daily, Spironolactone 25mg  daily, minimal BLE edema, stable.  - Continue statin  - Bp monitoring.

## 2014-11-17 NOTE — Assessment & Plan Note (Signed)
Continue Lantus 10 u daily, Humatolog 5u with meals.

## 2014-11-17 NOTE — Progress Notes (Signed)
Patient ID: [name], female   DOB: December 13, 1917, 81 y.o.   MRN: [id number]  [facility] Provider:  Marlana Latus NP  Code Status:  DNR Goals of care: Advanced Directive information    Chief Complaint  Patient presents with  . Medical Management of Chronic Issues  . Acute Visit    wheezing, elevated Bps     HPI: Patient is a 81 y.o. female seen in the [facility] at [facility] today for evaluation of elevated blood pressure, recently she was switched from Bystolic to [place] which dose was titrated up for elevated Bps with little efficacy, the patient requested to resume discontinued Cardura 11/07/14.  Denied chest pain, pressure, palpitation, vision change, headaches, or SOB, but she admitted fatigue and feeling bad when Bp is elevated. Clonidine prn has been effective. No reported wheezes ausculted upon my examination today.   Review of Systems  Constitutional: Negative for fever, chills and diaphoresis.       Baseline mobility is motorized scooter  HENT: Positive for hearing loss. Negative for congestion, ear discharge and ear pain.   Eyes: Negative for photophobia and pain.  Respiratory: Positive for cough and shortness of breath. Negative for sputum production and wheezing.   Cardiovascular: Positive for leg swelling. Negative for chest pain and palpitations.       Trace. Right knee pain.   Gastrointestinal: Negative for nausea, vomiting, abdominal pain, diarrhea, constipation and melena.  Genitourinary: Positive for frequency. Negative for dysuria and urgency.       Recurrent urinary tract infections. Nocturia. Urinary frequency.  Musculoskeletal: Negative for myalgias, back pain and neck pain.       R knee pain  Skin: Negative for rash.       Healing wound of the left hand dorsum near the thumb where a lesion was removed by Dr. Redmond Pulling at the [place] at [facility].  Neurological: Positive for weakness. Negative for dizziness, tremors, seizures and headaches.    Endo/Heme/Allergies: Negative for environmental allergies.  Psychiatric/Behavioral: Negative for suicidal ideas and hallucinations. The patient is not nervous/anxious.     Past Medical History  Diagnosis Date  . Type II or unspecified type diabetes mellitus without mention of complication, not stated as uncontrolled   . Hypertension   . Arthritis   . COPD (chronic obstructive pulmonary disease) ([place])   . Regional enteritis of unspecified site   . Other malaise and fatigue   . Anemia, unspecified   . Other and unspecified hyperlipidemia   . Hypoxemia   . Pneumonia, organism unspecified   . Mucopurulent chronic bronchitis ([place])   . Palpitations   . Nontoxic uninodular goiter   . Abnormality of gait   . Fall from other slipping, tripping, or stumbling   . Cholelithiases   . Closed fracture of unspecified part of ramus of mandible   . Disturbance of skin sensation   . Sciatica   . Tension headache   . Coronary atherosclerosis of unspecified type of vessel, native or graft   . Osteoarthrosis, unspecified whether generalized or localized, unspecified site   . Insomnia, unspecified   . Edema   . Tension headache   . Pain in joint, lower leg 2010    right knee  . Unspecified venous (peripheral) insufficiency   . Esophageal reflux   . Rosacea   . Cataract 1990    Dr. Katy Fitch  . Pain in joint, shoulder region 2013    left  . Gastric ulcer with hemorrhage  2008  . Urine, incontinence, stress female 07/19/2013  . Bronchiectasis without acute exacerbation ([place]) 04/10/2011    CT chest 2011   . DOE (dyspnea on exertion) 04/10/2011    PFTs 2013:  FEV1 1.04 (78%), ratio 64, couldn't do maneuver for lung volumes or dlco   . Nodule of neck 11/07/2013    Left neck posteriorly   . Squamous cell carcinoma of skin of lower limb, including hip 07/27/2012    Nonhealing lesion of the left mid calf medially     Patient Active Problem List   Diagnosis Date Noted  . Infection of urinary tract  06/23/2014  . Acute upper respiratory infection 06/06/2014  . Weakness 05/22/2014  . Insomnia 11/21/2013  . Nodule of neck 11/07/2013  . Palpitations 09/13/2013  . Nocturia 09/13/2013  . Hyperlipidemia 07/19/2013  . Urine, incontinence, stress female 07/19/2013  . Type 2 diabetes with decreased circulation ([place]) 05/24/2013  . Edema 05/24/2013  . Esophageal reflux   . Pain in joint, shoulder region 07/27/2012  . Hypertension   . Arthritis   . Other malaise and fatigue   . Mucopurulent chronic bronchitis ([place])   . Pain in joint, lower leg   . COPD (chronic obstructive pulmonary disease) ([place]) 02/06/2012  . DOE (dyspnea on exertion) 04/10/2011  . Bronchiectasis without acute exacerbation ([place]) 04/10/2011  . Pulmonary nodules 04/10/2011    Allergies  Allergen Reactions  . Adhesive [Tape]     unknown  . Aspirin     unknown  . Codeine     unknown  . Enalapril Cough  . Hydrocodone     unknown  . Pantoprazole Sodium     unknown    Medications: Patient's Medications  New Prescriptions   No medications on file  Previous Medications   ACETAMINOPHEN (TYLENOL) 500 MG TABLET    Take 500 mg by mouth. Take one tablet twice daily   AMLODIPINE (NORVASC) 5 MG TABLET    Take 10 mg by mouth daily.    ATORVASTATIN (LIPITOR) 10 MG TABLET    Take 10 mg by mouth daily.   CALCIUM CARBONATE (TUMS EX) 750 MG CHEWABLE TABLET    Chew 2 tablets by mouth 3 (three) times daily as needed for heartburn (after meals if needed for heartburn).    CETIRIZINE (ZYRTEC) 10 MG TABLET    Take 10 mg by mouth daily.   CLONIDINE (CATAPRES) 0.1 MG TABLET    Take 0.1 mg by mouth. Take one tablet as needed for bp > 160/100   DOXAZOSIN (CARDURA) 8 MG TABLET    Take 8 mg by mouth daily. To control BP.   DOXEPIN (SINEQUAN) 10 MG CAPSULE    Take 10 mg by mouth at bedtime as needed (for rest).    FUROSEMIDE (LASIX) 40 MG TABLET    Take 60 mg by mouth daily. Take one daily   GLIPIZIDE (GLUCOTROL) 10 MG TABLET    Take 5  mg by mouth daily.    GLUCOSE BLOOD TEST STRIP    Use three time daily to test blood sugar. Contour Test Strips. Dx:E11.9   GUAIFENESIN (ROBITUSSIN) 100 MG/5ML LIQUID    Take 200 mg by mouth. Take 10 ml every 6 hours as needed for cough   INSULIN GLARGINE (LANTUS) 100 UNIT/ML INJECTION    Inject 3 Units into the skin at bedtime.   INSULIN REGULAR (NOVOLIN R,HUMULIN R) 100 UNITS/ML INJECTION    Inject 5 Units into the skin 3 (three) times daily before meals.  LOSARTAN (COZAAR) 100 MG TABLET    Take 100 mg by mouth daily. Take one tablet daily for blood pressure   METFORMIN (GLUCOPHAGE) 500 MG TABLET    Take 1,000 mg by mouth 2 (two) times daily with a meal.   METOPROLOL TARTRATE (LOPRESSOR) 25 MG TABLET    One twice daily to control blood pressure   MIRABEGRON ER (MYRBETRIQ) 50 MG TB24 TABLET    One daily to assist and bladder control   OMEPRAZOLE (PRILOSEC) 20 MG CAPSULE       PREDNISONE (DELTASONE) 5 MG TABLET    1/2 tablet daily to help breathing   SIMETHICONE (GAS RELIEF 80 PO)    Take 1 tablet by mouth daily.   Modified Medications   No medications on file  Discontinued Medications   No medications on file    Physical Exam: Filed Vitals:   11/17/14 1248  BP: 184/88  Pulse: 88  Temp: 98.9 F (37.2 C)  TempSrc: Tympanic  Resp: 18   There is no weight on file to calculate BMI.  Physical Exam  Constitutional: She is oriented to person, place, and time. She appears well-developed and well-nourished. No distress.  HENT:  Head: Normocephalic and atraumatic.  Right Ear: External ear normal.  Left Ear: External ear normal.  Nose: Nose normal.  Eyes: Conjunctivae and EOM are normal. Pupils are equal, round, and reactive to light.  Neck: Neck supple. No JVD present. No tracheal deviation present. No thyromegaly present.  Cardiovascular: Normal rate, regular rhythm, normal heart sounds and intact distal pulses.  Exam reveals no gallop and no friction rub.   No murmur  heard. Pulmonary/Chest: No respiratory distress. She has no wheezes. She has rales.  Abdominal: Bowel sounds are normal. She exhibits no distension and no mass. There is no tenderness.  Musculoskeletal: She exhibits edema (2-3+ bipedal) and tenderness.  Scars from bilateral TKR. Right knee seems to be loose when stressing the joint. No effusion. Instable gait. Driving a power scooter today. Trace edema BLE Chronic R knee pain  Lymphadenopathy:    She has no cervical adenopathy.  Neurological: She is alert and oriented to person, place, and time. She has normal reflexes. No cranial nerve deficit. Coordination normal.  Diminished vibratory and monofilament testing in both feet.  Skin: No rash (Reddened rash on both legs. Some itching and burning associated with the rash.) noted. No erythema. No pallor.  Prior exam: 6 mm nodule of the left posterior neck at the SCM muscle seems resolved Likely SCC left leg medial to shin.  Psychiatric: She has a normal mood and affect. Her behavior is normal. Judgment and thought content normal.    Labs reviewed: Basic Metabolic Panel:  Recent Labs  03/09/14 06/16/14 08/16/14  NA 137 134* 139  K 4.0 3.9 4.7  BUN 21 21 22*  CREATININE 0.7 0.7 0.8    Liver Function Tests:  Recent Labs  08/16/14  AST 15  ALT 11  ALKPHOS 72    CBC:  Recent Labs  03/09/14 06/16/14 08/16/14  WBC 8.2 10.1 8.6  HGB 11.4* 13.4 12.1  HCT 34* 41 37  PLT 215 214 210    Lab Results  Component Value Date   TSH 7.60* 03/09/2014   Lab Results  Component Value Date   HGBA1C 7.6* 03/09/2014   Lab Results  Component Value Date   CHOL 108 10/27/2013   HDL 54 10/27/2013   LDLCALC 33 10/27/2013   TRIG 103 10/27/2013    Significant  Diagnostic Results since last visit: none  Patient Care Team: Estill Dooms, MD as PCP - General (Internal Medicine) Clent Jacks, MD (Ophthalmology) Adrian Prows, MD as Attending Physician (Cardiology) [facility] [name], MD as Consulting Physician (Orthopedic Surgery) Kathee Delton, MD as Consulting Physician (Pulmonary Disease)  Assessment/Plan Problem List Items Addressed This Visit    COPD (chronic obstructive pulmonary disease) ([place]) (Chronic)    06/15/14 CXR stable in comparison with prior exam.  - presently Prednisone 5 mg daily maintenance dose.  - ECHO--EF 21-30%, grade 2 diastolic dysfunction  - Prn Neb available to her      Hypertension - Primary (Chronic)    - resume Cardura 8mg  daily, continue Coreg 6.25mg  bid, Losartan 50mg  and prn Clonidine 0.1mg  fro Bp>160/100 - On lasix 20mg  daily, Spironolactone 25mg  daily, minimal BLE edema, stable.  - Continue statin  - Bp monitoring.       Type 2 diabetes with decreased circulation (HCC) (Chronic)    Continue Lantus 10 u daily, Humatolog 5u with meals.       Edema (Chronic)    Trace BLE,  Continue Furosemide 20mg  and Spironolactone 25mg  daily      Mucopurulent chronic bronchitis (HCC)    No wheezes auscultated, will have prn Neb available to her.           Family/ staff Communication: continue to monitor BP and pulse.   Labs/tests ordered: none  ManXie Mery Guadalupe NP [facility] [address], [place] [address] On Call:  [phone] & follow prompts after 5pm & weekends Office Phone:  [phone] Office Fax:  [phone]

## 2014-11-21 ENCOUNTER — Encounter: Payer: Self-pay | Admitting: Nurse Practitioner

## 2014-11-21 ENCOUNTER — Non-Acute Institutional Stay: Payer: Medicare Other | Admitting: Nurse Practitioner

## 2014-11-21 DIAGNOSIS — K219 Gastro-esophageal reflux disease without esophagitis: Secondary | ICD-10-CM

## 2014-11-21 DIAGNOSIS — I10 Essential (primary) hypertension: Secondary | ICD-10-CM

## 2014-11-21 DIAGNOSIS — J410 Simple chronic bronchitis: Secondary | ICD-10-CM | POA: Diagnosis not present

## 2014-11-21 DIAGNOSIS — R609 Edema, unspecified: Secondary | ICD-10-CM | POA: Diagnosis not present

## 2014-11-21 DIAGNOSIS — E1151 Type 2 diabetes mellitus with diabetic peripheral angiopathy without gangrene: Secondary | ICD-10-CM

## 2014-11-21 NOTE — Assessment & Plan Note (Signed)
hx of bleeding ulcers, stable. Continue omeprazole and prn TUMs EX p meals prn.

## 2014-11-21 NOTE — Progress Notes (Signed)
Patient ID: [name], female   DOB: 1917/04/26, 81 y.o.   MRN: [id number]  Location:  [facility] Provider:  Marlana Latus NP  Code Status:  DNR Goals of care: Advanced Directive information    Chief Complaint  Patient presents with  . Medical Management of Chronic Issues  . Acute Visit    congestive cough, blood pressure     HPI: Patient is a 81 y.o. female seen in the [facility] at [facility] today for evaluation of congestive cough, wheezes, SOB, afebrile, denied chest pain, CXR 11/19/14 no evidence of acute pulmonary pathology, DuoNeb tid since 11/19/14, 10 day course of Avelox started 11/20/14. Her blood pressure 154-126/80-56, better controlled since Cardura resumed.   Review of Systems  Constitutional: Negative for fever, chills and diaphoresis.       Baseline mobility is motorized scooter  HENT: Positive for hearing loss. Negative for congestion, ear discharge and ear pain.   Eyes: Negative for photophobia and pain.  Respiratory: Positive for cough, sputum production, shortness of breath and wheezing.   Cardiovascular: Positive for leg swelling. Negative for chest pain and palpitations.       Trace. Right knee pain.   Gastrointestinal: Negative for nausea, vomiting, abdominal pain, diarrhea, constipation and melena.  Genitourinary: Positive for frequency. Negative for dysuria and urgency.       Recurrent urinary tract infections. Nocturia. Urinary frequency.  Musculoskeletal: Negative for myalgias, back pain and neck pain.       R knee pain  Skin: Negative for rash.       Healing wound of the left hand dorsum near the thumb where a lesion was removed by Dr. Redmond Pulling at the [place] at [facility].  Neurological: Positive for weakness. Negative for dizziness, tremors, seizures and headaches.  Endo/Heme/Allergies: Negative for environmental allergies.  Psychiatric/Behavioral: Negative for suicidal ideas and hallucinations. The patient is not nervous/anxious.     Past  Medical History  Diagnosis Date  . Type II or unspecified type diabetes mellitus without mention of complication, not stated as uncontrolled   . Hypertension   . Arthritis   . COPD (chronic obstructive pulmonary disease) ([place])   . Regional enteritis of unspecified site   . Other malaise and fatigue   . Anemia, unspecified   . Other and unspecified hyperlipidemia   . Hypoxemia   . Pneumonia, organism unspecified   . Mucopurulent chronic bronchitis ([place])   . Palpitations   . Nontoxic uninodular goiter   . Abnormality of gait   . Fall from other slipping, tripping, or stumbling   . Cholelithiases   . Closed fracture of unspecified part of ramus of mandible   . Disturbance of skin sensation   . Sciatica   . Tension headache   . Coronary atherosclerosis of unspecified type of vessel, native or graft   . Osteoarthrosis, unspecified whether generalized or localized, unspecified site   . Insomnia, unspecified   . Edema   . Tension headache   . Pain in joint, lower leg 2010    right knee  . Unspecified venous (peripheral) insufficiency   . Esophageal reflux   . Rosacea   . Cataract 1990    Dr. Katy Fitch  . Pain in joint, shoulder region 2013    left  . Gastric ulcer with hemorrhage 2008  . Urine, incontinence, stress female 07/19/2013  . Bronchiectasis without acute exacerbation ([place]) 04/10/2011    CT chest 2011   . DOE (dyspnea on exertion) 04/10/2011  PFTs 2013:  FEV1 1.04 (78%), ratio 64, couldn't do maneuver for lung volumes or dlco   . Nodule of neck 11/07/2013    Left neck posteriorly   . Squamous cell carcinoma of skin of lower limb, including hip 07/27/2012    Nonhealing lesion of the left mid calf medially     Patient Active Problem List   Diagnosis Date Noted  . Infection of urinary tract 06/23/2014  . Acute upper respiratory infection 06/06/2014  . Weakness 05/22/2014  . Insomnia 11/21/2013  . Nodule of neck 11/07/2013  . Palpitations 09/13/2013  . Nocturia 09/13/2013   . Hyperlipidemia 07/19/2013  . Urine, incontinence, stress female 07/19/2013  . Type 2 diabetes with decreased circulation ([place]) 05/24/2013  . Edema 05/24/2013  . Esophageal reflux   . Pain in joint, shoulder region 07/27/2012  . Hypertension   . Arthritis   . Other malaise and fatigue   . Mucopurulent chronic bronchitis ([place])   . Pain in joint, lower leg   . COPD (chronic obstructive pulmonary disease) ([place]) 02/06/2012  . DOE (dyspnea on exertion) 04/10/2011  . Bronchiectasis without acute exacerbation ([place]) 04/10/2011  . Pulmonary nodules 04/10/2011    Allergies  Allergen Reactions  . Adhesive [Tape]     unknown  . Aspirin     unknown  . Codeine     unknown  . Enalapril Cough  . Hydrocodone     unknown  . Pantoprazole Sodium     unknown    Medications: Patient's Medications  New Prescriptions   No medications on file  Previous Medications   ACETAMINOPHEN (TYLENOL) 500 MG TABLET    Take 500 mg by mouth. Take one tablet twice daily   AMLODIPINE (NORVASC) 5 MG TABLET    Take 10 mg by mouth daily.    ATORVASTATIN (LIPITOR) 10 MG TABLET    Take 10 mg by mouth daily.   CALCIUM CARBONATE (TUMS EX) 750 MG CHEWABLE TABLET    Chew 2 tablets by mouth 3 (three) times daily as needed for heartburn (after meals if needed for heartburn).    CETIRIZINE (ZYRTEC) 10 MG TABLET    Take 10 mg by mouth daily.   CLONIDINE (CATAPRES) 0.1 MG TABLET    Take 0.1 mg by mouth. Take one tablet as needed for bp > 160/100   DOXAZOSIN (CARDURA) 8 MG TABLET    Take 8 mg by mouth daily. To control BP.   DOXEPIN (SINEQUAN) 10 MG CAPSULE    Take 10 mg by mouth at bedtime as needed (for rest).    FUROSEMIDE (LASIX) 40 MG TABLET    Take 60 mg by mouth daily. Take one daily   GLIPIZIDE (GLUCOTROL) 10 MG TABLET    Take 5 mg by mouth daily.    GLUCOSE BLOOD TEST STRIP    Use three time daily to test blood sugar. Contour Test Strips. Dx:E11.9   GUAIFENESIN (ROBITUSSIN) 100 MG/5ML LIQUID    Take 200 mg by  mouth. Take 10 ml every 6 hours as needed for cough   INSULIN GLARGINE (LANTUS) 100 UNIT/ML INJECTION    Inject 3 Units into the skin at bedtime.   INSULIN REGULAR (NOVOLIN R,HUMULIN R) 100 UNITS/ML INJECTION    Inject 5 Units into the skin 3 (three) times daily before meals.   LOSARTAN (COZAAR) 100 MG TABLET    Take 100 mg by mouth daily. Take one tablet daily for blood pressure   METFORMIN (GLUCOPHAGE) 500 MG TABLET    Take  1,000 mg by mouth 2 (two) times daily with a meal.   METOPROLOL TARTRATE (LOPRESSOR) 25 MG TABLET    One twice daily to control blood pressure   MIRABEGRON ER (MYRBETRIQ) 50 MG TB24 TABLET    One daily to assist and bladder control   OMEPRAZOLE (PRILOSEC) 20 MG CAPSULE       PREDNISONE (DELTASONE) 5 MG TABLET    1/2 tablet daily to help breathing   SIMETHICONE (GAS RELIEF 80 PO)    Take 1 tablet by mouth daily.   Modified Medications   No medications on file  Discontinued Medications   No medications on file    Physical Exam: Filed Vitals:   11/21/14 1158  BP: 126/65  Pulse: 93  Temp: 99.8 F (37.7 C)  TempSrc: Tympanic  Resp: 20   There is no weight on file to calculate BMI.  Physical Exam  Constitutional: She is oriented to person, place, and time. She appears well-developed and well-nourished. No distress.  HENT:  Head: Normocephalic and atraumatic.  Right Ear: External ear normal.  Left Ear: External ear normal.  Nose: Nose normal.  Eyes: Conjunctivae and EOM are normal. Pupils are equal, round, and reactive to light.  Neck: Neck supple. No JVD present. No tracheal deviation present. No thyromegaly present.  Cardiovascular: Normal rate, regular rhythm, normal heart sounds and intact distal pulses.  Exam reveals no gallop and no friction rub.   No murmur heard. Pulmonary/Chest: No respiratory distress. She has wheezes. She has rales.  Abdominal: Bowel sounds are normal. She exhibits no distension and no mass. There is no tenderness.  Musculoskeletal:  She exhibits edema (2-3+ bipedal) and tenderness.  Scars from bilateral TKR. Right knee seems to be loose when stressing the joint. No effusion. Instable gait. Driving a power scooter today. Trace edema BLE Chronic R knee pain  Lymphadenopathy:    She has no cervical adenopathy.  Neurological: She is alert and oriented to person, place, and time. She has normal reflexes. No cranial nerve deficit. Coordination normal.  Diminished vibratory and monofilament testing in both feet.  Skin: No rash (Reddened rash on both legs. Some itching and burning associated with the rash.) noted. No erythema. No pallor.  Prior exam: 6 mm nodule of the left posterior neck at the SCM muscle seems resolved Likely SCC left leg medial to shin.  Psychiatric: She has a normal mood and affect. Her behavior is normal. Judgment and thought content normal.    Labs reviewed: Basic Metabolic Panel:  Recent Labs  03/09/14 06/16/14 08/16/14  NA 137 134* 139  K 4.0 3.9 4.7  BUN 21 21 22*  CREATININE 0.7 0.7 0.8    Liver Function Tests:  Recent Labs  08/16/14  AST 15  ALT 11  ALKPHOS 72    CBC:  Recent Labs  03/09/14 06/16/14 08/16/14  WBC 8.2 10.1 8.6  HGB 11.4* 13.4 12.1  HCT 34* 41 37  PLT 215 214 210    Lab Results  Component Value Date   TSH 7.60* 03/09/2014   Lab Results  Component Value Date   HGBA1C 7.6* 03/09/2014   Lab Results  Component Value Date   CHOL 108 10/27/2013   HDL 54 10/27/2013   LDLCALC 33 10/27/2013   TRIG 103 10/27/2013    Significant Diagnostic Results since last visit: none  Patient Care Team: Estill Dooms, MD as PCP - General (Internal Medicine) Clent Jacks, MD (Ophthalmology) Adrian Prows, MD as Attending Physician (Cardiology) [place]  Phillips Climes, MD as Consulting Physician (Orthopedic Surgery) Kathee Delton, MD as Consulting Physician (Pulmonary Disease)  Assessment/Plan Problem List Items Addressed This Visit    COPD (chronic  obstructive pulmonary disease) ([place]) - Primary (Chronic)    Wheezing cough, increased SOB, denied chest pain or fever/chills, 11/19/14 CXR no evidence of acute pulmonary pathology, will change Albuterol Neb tid from Duoneb(insurance coverage issue), adding Prednisone 20mg  daily x 5, then 10mg  x5, then 5mg  daily maintenance dose. Update CBC, CMP, TSH, Hgb A1c.       Hypertension (Chronic)    - controlled, continue Cardura 8mg  daily, Coreg 6.25mg  bid, Losartan 50mg  and prn Clonidine 0.1mg  fro Bp>160/100 - On lasix 20mg  daily, Spironolactone 25mg  daily, minimal BLE edema, stable.  - Continue statin  - Bp monitoring.       Type 2 diabetes with decreased circulation (HCC) (Chronic)    Continue Lantus 10 u daily, Humatolog 5u with meals. Update Hgb A1c      Edema (Chronic)    Minimal edema, continue Furosemide and Spironolactone, update CMP      Esophageal reflux    hx of bleeding ulcers, stable. Continue omeprazole and prn TUMs EX p meals prn.           Family/ staff Communication: monitor for respiratory symptoms.   Labs/tests ordered: CXR done 11/19/14, CBC, CMP, TSH, UA C/S in am  [facility] Ledon Weihe NP [facility] [address], [place] [address] On Call:  [phone] & follow prompts after 5pm & weekends Office Phone:  [phone] Office Fax:  [phone]

## 2014-11-21 NOTE — Assessment & Plan Note (Signed)
Wheezing cough, increased SOB, denied chest pain or fever/chills, 11/19/14 CXR no evidence of acute pulmonary pathology, will change Albuterol Neb tid from Duoneb(insurance coverage issue), adding Prednisone 20mg  daily x 5, then 10mg  x5, then 5mg  daily maintenance dose. Update CBC, CMP, TSH, Hgb A1c.

## 2014-11-21 NOTE — Assessment & Plan Note (Signed)
Continue Lantus 10 u daily, Humatolog 5u with meals. Update Hgb A1c

## 2014-11-21 NOTE — Assessment & Plan Note (Signed)
-   controlled, continue Cardura 8mg  daily, Coreg 6.25mg  bid, Losartan 50mg  and prn Clonidine 0.1mg  fro Bp>160/100 - On lasix 20mg  daily, Spironolactone 25mg  daily, minimal BLE edema, stable.  - Continue statin  - Bp monitoring.

## 2014-11-21 NOTE — Assessment & Plan Note (Signed)
Minimal edema, continue Furosemide and Spironolactone, update CMP

## 2014-11-22 LAB — HEPATIC FUNCTION PANEL
ALK PHOS: 103 U/L (ref 25–125)
ALT: 17 U/L (ref 7–35)
AST: 16 U/L (ref 13–35)
BILIRUBIN, TOTAL: 0.3 mg/dL

## 2014-11-22 LAB — CBC AND DIFFERENTIAL
HEMATOCRIT: 34 % — AB (ref 36–46)
HEMOGLOBIN: 11.5 g/dL — AB (ref 12.0–16.0)
Platelets: 239 10*3/uL (ref 150–399)
WBC: 17.8 10^3/mL

## 2014-11-22 LAB — BASIC METABOLIC PANEL
BUN: 20 mg/dL (ref 4–21)
Creatinine: 0.7 mg/dL (ref 0.5–1.1)
Glucose: 166 mg/dL
Potassium: 5 mmol/L (ref 3.4–5.3)
SODIUM: 121 mmol/L — AB (ref 137–147)

## 2014-11-22 LAB — HEMOGLOBIN A1C: Hgb A1c MFr Bld: 7.6 % — AB (ref 4.0–6.0)

## 2014-11-22 LAB — TSH: TSH: 0.58 u[IU]/mL (ref 0.41–5.90)

## 2014-11-24 ENCOUNTER — Encounter (HOSPITAL_COMMUNITY): Payer: Self-pay | Admitting: *Deleted

## 2014-11-24 ENCOUNTER — Emergency Department (HOSPITAL_COMMUNITY): Payer: Medicare Other

## 2014-11-24 ENCOUNTER — Encounter: Payer: Self-pay | Admitting: Nurse Practitioner

## 2014-11-24 ENCOUNTER — Inpatient Hospital Stay (HOSPITAL_COMMUNITY)
Admission: EM | Admit: 2014-11-24 | Discharge: 2014-11-29 | DRG: 193 | Disposition: A | Discharge status: Skilled Nursing Facility | Payer: Medicare Other | Attending: Internal Medicine | Admitting: Internal Medicine

## 2014-11-24 ENCOUNTER — Other Ambulatory Visit: Payer: Self-pay | Admitting: Nurse Practitioner

## 2014-11-24 DIAGNOSIS — E669 Obesity, unspecified: Secondary | ICD-10-CM | POA: Diagnosis present

## 2014-11-24 DIAGNOSIS — N393 Stress incontinence (female) (male): Secondary | ICD-10-CM | POA: Diagnosis present

## 2014-11-24 DIAGNOSIS — E1165 Type 2 diabetes mellitus with hyperglycemia: Secondary | ICD-10-CM | POA: Diagnosis present

## 2014-11-24 DIAGNOSIS — Z66 Do not resuscitate: Secondary | ICD-10-CM | POA: Diagnosis present

## 2014-11-24 DIAGNOSIS — Z800 Family history of malignant neoplasm of digestive organs: Secondary | ICD-10-CM

## 2014-11-24 DIAGNOSIS — E871 Hypo-osmolality and hyponatremia: Secondary | ICD-10-CM | POA: Diagnosis present

## 2014-11-24 DIAGNOSIS — E1122 Type 2 diabetes mellitus with diabetic chronic kidney disease: Secondary | ICD-10-CM | POA: Diagnosis present

## 2014-11-24 DIAGNOSIS — E785 Hyperlipidemia, unspecified: Secondary | ICD-10-CM | POA: Diagnosis present

## 2014-11-24 DIAGNOSIS — J411 Mucopurulent chronic bronchitis: Secondary | ICD-10-CM | POA: Diagnosis present

## 2014-11-24 DIAGNOSIS — E875 Hyperkalemia: Secondary | ICD-10-CM | POA: Diagnosis present

## 2014-11-24 DIAGNOSIS — N183 Chronic kidney disease, stage 3 (moderate): Secondary | ICD-10-CM

## 2014-11-24 DIAGNOSIS — I272 Other secondary pulmonary hypertension: Secondary | ICD-10-CM | POA: Diagnosis present

## 2014-11-24 DIAGNOSIS — I872 Venous insufficiency (chronic) (peripheral): Secondary | ICD-10-CM | POA: Diagnosis present

## 2014-11-24 DIAGNOSIS — Z8711 Personal history of peptic ulcer disease: Secondary | ICD-10-CM | POA: Diagnosis not present

## 2014-11-24 DIAGNOSIS — R05 Cough: Secondary | ICD-10-CM | POA: Diagnosis present

## 2014-11-24 DIAGNOSIS — Z8249 Family history of ischemic heart disease and other diseases of the circulatory system: Secondary | ICD-10-CM

## 2014-11-24 DIAGNOSIS — J471 Bronchiectasis with (acute) exacerbation: Secondary | ICD-10-CM | POA: Diagnosis present

## 2014-11-24 DIAGNOSIS — Z6832 Body mass index (BMI) 32.0-32.9, adult: Secondary | ICD-10-CM

## 2014-11-24 DIAGNOSIS — N390 Urinary tract infection, site not specified: Secondary | ICD-10-CM | POA: Diagnosis present

## 2014-11-24 DIAGNOSIS — E1151 Type 2 diabetes mellitus with diabetic peripheral angiopathy without gangrene: Secondary | ICD-10-CM

## 2014-11-24 DIAGNOSIS — Z9981 Dependence on supplemental oxygen: Secondary | ICD-10-CM | POA: Diagnosis not present

## 2014-11-24 DIAGNOSIS — I2510 Atherosclerotic heart disease of native coronary artery without angina pectoris: Secondary | ICD-10-CM | POA: Diagnosis present

## 2014-11-24 DIAGNOSIS — Y95 Nosocomial condition: Secondary | ICD-10-CM | POA: Diagnosis present

## 2014-11-24 DIAGNOSIS — J9621 Acute and chronic respiratory failure with hypoxia: Secondary | ICD-10-CM | POA: Diagnosis not present

## 2014-11-24 DIAGNOSIS — E860 Dehydration: Secondary | ICD-10-CM | POA: Diagnosis present

## 2014-11-24 DIAGNOSIS — K219 Gastro-esophageal reflux disease without esophagitis: Secondary | ICD-10-CM | POA: Diagnosis present

## 2014-11-24 DIAGNOSIS — T380X5A Adverse effect of glucocorticoids and synthetic analogues, initial encounter: Secondary | ICD-10-CM | POA: Diagnosis present

## 2014-11-24 DIAGNOSIS — J189 Pneumonia, unspecified organism: Principal | ICD-10-CM | POA: Diagnosis present

## 2014-11-24 DIAGNOSIS — J9620 Acute and chronic respiratory failure, unspecified whether with hypoxia or hypercapnia: Secondary | ICD-10-CM | POA: Diagnosis present

## 2014-11-24 DIAGNOSIS — Z96653 Presence of artificial knee joint, bilateral: Secondary | ICD-10-CM | POA: Diagnosis present

## 2014-11-24 DIAGNOSIS — I10 Essential (primary) hypertension: Secondary | ICD-10-CM | POA: Diagnosis present

## 2014-11-24 DIAGNOSIS — Z794 Long term (current) use of insulin: Secondary | ICD-10-CM

## 2014-11-24 LAB — CBC WITH DIFFERENTIAL/PLATELET
BASOS ABS: 0.2 10*3/uL — AB (ref 0.0–0.1)
Basophils Relative: 1 %
EOS ABS: 0 10*3/uL (ref 0.0–0.7)
Eosinophils Relative: 0 %
HCT: 35.1 % — ABNORMAL LOW (ref 36.0–46.0)
Hemoglobin: 11.8 g/dL — ABNORMAL LOW (ref 12.0–15.0)
LYMPHS PCT: 7 %
Lymphs Abs: 1.3 10*3/uL (ref 0.7–4.0)
MCH: 30.5 pg (ref 26.0–34.0)
MCHC: 33.6 g/dL (ref 30.0–36.0)
MCV: 90.7 fL (ref 78.0–100.0)
MONO ABS: 1.6 10*3/uL — AB (ref 0.1–1.0)
Monocytes Relative: 9 %
NEUTROS PCT: 83 %
Neutro Abs: 14.8 10*3/uL — ABNORMAL HIGH (ref 1.7–7.7)
PLATELETS: 263 10*3/uL (ref 150–400)
RBC: 3.87 MIL/uL (ref 3.87–5.11)
RDW: 13.8 % (ref 11.5–15.5)
WBC: 17.9 10*3/uL — AB (ref 4.0–10.5)

## 2014-11-24 LAB — I-STAT CG4 LACTIC ACID, ED
Lactic Acid, Venous: 1.55 mmol/L (ref 0.5–2.0)
Lactic Acid, Venous: 0.77 mmol/L (ref 0.5–2.0)

## 2014-11-24 LAB — I-STAT TROPONIN, ED: TROPONIN I, POC: 0.01 ng/mL (ref 0.00–0.08)

## 2014-11-24 LAB — COMPREHENSIVE METABOLIC PANEL
ALK PHOS: 102 U/L (ref 38–126)
ALT: 24 U/L (ref 14–54)
ANION GAP: 7 (ref 5–15)
AST: 23 U/L (ref 15–41)
Albumin: 2.8 g/dL — ABNORMAL LOW (ref 3.5–5.0)
BILIRUBIN TOTAL: 0.5 mg/dL (ref 0.3–1.2)
BUN: 27 mg/dL — ABNORMAL HIGH (ref 6–20)
CALCIUM: 9.2 mg/dL (ref 8.9–10.3)
CO2: 29 mmol/L (ref 22–32)
Chloride: 89 mmol/L — ABNORMAL LOW (ref 101–111)
Creatinine, Ser: 0.82 mg/dL (ref 0.44–1.00)
GFR calc non Af Amer: 59 mL/min — ABNORMAL LOW (ref >60)
Glucose, Bld: 154 mg/dL — ABNORMAL HIGH (ref 65–99)
POTASSIUM: 5.1 mmol/L (ref 3.5–5.1)
SODIUM: 125 mmol/L — AB (ref 135–145)
TOTAL PROTEIN: 6.3 g/dL — AB (ref 6.5–8.1)

## 2014-11-24 LAB — URINALYSIS, ROUTINE W REFLEX MICROSCOPIC
Bilirubin Urine: NEGATIVE
GLUCOSE, UA: NEGATIVE mg/dL
KETONES UR: NEGATIVE mg/dL
NITRITE: NEGATIVE
Protein, ur: NEGATIVE mg/dL
Specific Gravity, Urine: 1.008 (ref 1.005–1.030)
UROBILINOGEN UA: 0.2 mg/dL (ref 0.0–1.0)
pH: 6 (ref 5.0–8.0)

## 2014-11-24 LAB — URINE MICROSCOPIC-ADD ON

## 2014-11-24 LAB — GLUCOSE, CAPILLARY
Glucose-Capillary: 272 mg/dL — ABNORMAL HIGH (ref 65–99)
Glucose-Capillary: 203 mg/dL — ABNORMAL HIGH (ref 65–99)

## 2014-11-24 LAB — BRAIN NATRIURETIC PEPTIDE: B Natriuretic Peptide: 202.7 pg/mL — ABNORMAL HIGH (ref 0.0–100.0)

## 2014-11-24 MED ORDER — LOSARTAN POTASSIUM 50 MG PO TABS
50.0000 mg | ORAL_TABLET | Freq: Every day | ORAL | Status: DC
  Administered 2014-11-25 – 2014-11-29 (×5): 50 mg via ORAL
  Filled 2014-11-24 (×6): qty 1

## 2014-11-24 MED ORDER — IPRATROPIUM BROMIDE 0.02 % IN SOLN: 0.5000 mg | Freq: Four times a day (QID) | RESPIRATORY_TRACT | Status: DC

## 2014-11-24 MED ORDER — ATORVASTATIN CALCIUM 10 MG PO TABS
10.0000 mg | ORAL_TABLET | Freq: Every day | ORAL | Status: DC
  Administered 2014-11-24 – 2014-11-28 (×5): 10 mg via ORAL
  Filled 2014-11-24 (×6): qty 1

## 2014-11-24 MED ORDER — IPRATROPIUM-ALBUTEROL 0.5-2.5 (3) MG/3ML IN SOLN: 3.0000 mL | Freq: Four times a day (QID) | RESPIRATORY_TRACT | Status: DC

## 2014-11-24 MED ORDER — OMEPRAZOLE 20 MG PO CPDR
20.0000 mg | DELAYED_RELEASE_CAPSULE | Freq: Every day | ORAL | Status: DC
  Administered 2014-11-25 – 2014-11-29 (×5): 20 mg via ORAL
  Filled 2014-11-24 (×6): qty 1

## 2014-11-24 MED ORDER — ENOXAPARIN SODIUM 40 MG/0.4ML ~~LOC~~ SOLN
40.0000 mg | SUBCUTANEOUS | Status: DC
Start: 2014-11-24 — End: 2014-11-29
  Administered 2014-11-24 – 2014-11-28 (×5): 40 mg via SUBCUTANEOUS
  Filled 2014-11-24 (×6): qty 0.4

## 2014-11-24 MED ORDER — INSULIN GLARGINE 100 UNIT/ML ~~LOC~~ SOLN
10.0000 [IU] | Freq: Every day | SUBCUTANEOUS | Status: DC
  Administered 2014-11-24 – 2014-11-27 (×4): 10 [IU] via SUBCUTANEOUS
  Filled 2014-11-24 (×5): qty 0.1

## 2014-11-24 MED ORDER — ALBUTEROL SULFATE (2.5 MG/3ML) 0.083% IN NEBU
INHALATION_SOLUTION | RESPIRATORY_TRACT | Status: AC
  Filled 2014-11-24: qty 3

## 2014-11-24 MED ORDER — ALUM & MAG HYDROXIDE-SIMETH 200-200-20 MG/5ML PO SUSP: 30.0000 mL | Freq: Four times a day (QID) | ORAL | Status: DC | PRN

## 2014-11-24 MED ORDER — ACETAMINOPHEN 325 MG PO TABS
650.0000 mg | ORAL_TABLET | Freq: Four times a day (QID) | ORAL | Status: DC | PRN
  Administered 2014-11-26 – 2014-11-28 (×3): 650 mg via ORAL
  Filled 2014-11-24 (×3): qty 2

## 2014-11-24 MED ORDER — ALBUTEROL SULFATE (2.5 MG/3ML) 0.083% IN NEBU: 2.5000 mg | INHALATION_SOLUTION | RESPIRATORY_TRACT | Status: DC | PRN

## 2014-11-24 MED ORDER — GUAIFENESIN-DM 100-10 MG/5ML PO SYRP: 5.0000 mL | ORAL_SOLUTION | ORAL | Status: DC | PRN

## 2014-11-24 MED ORDER — PIPERACILLIN-TAZOBACTAM 3.375 G IVPB
3.3750 g | Freq: Three times a day (TID) | INTRAVENOUS | Status: DC
  Administered 2014-11-25 – 2014-11-28 (×11): 3.375 g via INTRAVENOUS
  Filled 2014-11-24 (×12): qty 50

## 2014-11-24 MED ORDER — VANCOMYCIN HCL 10 G IV SOLR
1250.0000 mg | Freq: Once | INTRAVENOUS | Status: AC
  Administered 2014-11-24: 1250 mg via INTRAVENOUS
  Filled 2014-11-24: qty 1250

## 2014-11-24 MED ORDER — GUAIFENESIN ER 600 MG PO TB12
600.0000 mg | ORAL_TABLET | Freq: Two times a day (BID) | ORAL | Status: DC
  Administered 2014-11-24 – 2014-11-29 (×10): 600 mg via ORAL
  Filled 2014-11-24 (×13): qty 1

## 2014-11-24 MED ORDER — METHYLPREDNISOLONE SODIUM SUCC 125 MG IJ SOLR
60.0000 mg | Freq: Two times a day (BID) | INTRAMUSCULAR | Status: DC
  Administered 2014-11-24 – 2014-11-25 (×2): 60 mg via INTRAVENOUS
  Filled 2014-11-24 (×4): qty 0.96

## 2014-11-24 MED ORDER — ALBUTEROL SULFATE (2.5 MG/3ML) 0.083% IN NEBU: 2.5000 mg | INHALATION_SOLUTION | Freq: Four times a day (QID) | RESPIRATORY_TRACT | Status: DC

## 2014-11-24 MED ORDER — PIPERACILLIN-TAZOBACTAM 3.375 G IVPB 30 MIN
3.3750 g | Freq: Once | INTRAVENOUS | Status: AC
  Administered 2014-11-24: 3.375 g via INTRAVENOUS
  Filled 2014-11-24: qty 50

## 2014-11-24 MED ORDER — SACCHAROMYCES BOULARDII 250 MG PO CAPS
250.0000 mg | ORAL_CAPSULE | Freq: Two times a day (BID) | ORAL | Status: DC
  Administered 2014-11-24 – 2014-11-29 (×10): 250 mg via ORAL
  Filled 2014-11-24 (×12): qty 1

## 2014-11-24 MED ORDER — ACETAMINOPHEN 650 MG RE SUPP: 650.0000 mg | Freq: Four times a day (QID) | RECTAL | Status: DC | PRN

## 2014-11-24 MED ORDER — ONDANSETRON HCL 4 MG PO TABS: 4.0000 mg | ORAL_TABLET | Freq: Four times a day (QID) | ORAL | Status: DC | PRN

## 2014-11-24 MED ORDER — SODIUM CHLORIDE 0.9 % IV SOLN: 250.0000 mL | INTRAVENOUS | Status: DC | PRN

## 2014-11-24 MED ORDER — DOXAZOSIN MESYLATE 8 MG PO TABS
8.0000 mg | ORAL_TABLET | Freq: Every day | ORAL | Status: DC
  Administered 2014-11-25 – 2014-11-29 (×5): 8 mg via ORAL
  Filled 2014-11-24 (×6): qty 1

## 2014-11-24 MED ORDER — IPRATROPIUM-ALBUTEROL 0.5-2.5 (3) MG/3ML IN SOLN
3.0000 mL | Freq: Three times a day (TID) | RESPIRATORY_TRACT | Status: DC
  Administered 2014-11-24 – 2014-11-25 (×2): 3 mL via RESPIRATORY_TRACT
  Filled 2014-11-24 (×2): qty 3

## 2014-11-24 MED ORDER — INSULIN ASPART 100 UNIT/ML ~~LOC~~ SOLN
0.0000 [IU] | Freq: Every day | SUBCUTANEOUS | Status: DC
  Administered 2014-11-24: 2 [IU] via SUBCUTANEOUS

## 2014-11-24 MED ORDER — SODIUM CHLORIDE 0.9 % IJ SOLN
3.0000 mL | INTRAMUSCULAR | Status: DC | PRN
  Administered 2014-11-24: 3 mL via INTRAVENOUS
  Filled 2014-11-24: qty 3

## 2014-11-24 MED ORDER — CARVEDILOL 6.25 MG PO TABS
6.2500 mg | ORAL_TABLET | Freq: Two times a day (BID) | ORAL | Status: DC
  Administered 2014-11-24 – 2014-11-29 (×10): 6.25 mg via ORAL
  Filled 2014-11-24 (×12): qty 1

## 2014-11-24 MED ORDER — ONDANSETRON HCL 4 MG/2ML IJ SOLN: 4.0000 mg | Freq: Four times a day (QID) | INTRAMUSCULAR | Status: DC | PRN

## 2014-11-24 MED ORDER — ALBUTEROL SULFATE (2.5 MG/3ML) 0.083% IN NEBU
2.5000 mg | INHALATION_SOLUTION | RESPIRATORY_TRACT | Status: DC | PRN
  Administered 2014-11-24: 2.5 mg via RESPIRATORY_TRACT

## 2014-11-24 MED ORDER — SODIUM CHLORIDE 0.9 % IJ SOLN
3.0000 mL | Freq: Two times a day (BID) | INTRAMUSCULAR | Status: DC
  Administered 2014-11-27 – 2014-11-29 (×4): 3 mL via INTRAVENOUS

## 2014-11-24 MED ORDER — INSULIN ASPART 100 UNIT/ML ~~LOC~~ SOLN
0.0000 [IU] | Freq: Three times a day (TID) | SUBCUTANEOUS | Status: DC
  Administered 2014-11-24 – 2014-11-25 (×2): 8 [IU] via SUBCUTANEOUS

## 2014-11-24 MED ORDER — NON FORMULARY: 20.0000 mg | Freq: Every morning | Status: DC

## 2014-11-24 MED ORDER — POLYETHYLENE GLYCOL 3350 17 G PO PACK: 17.0000 g | PACK | Freq: Every day | ORAL | Status: DC | PRN

## 2014-11-24 MED ORDER — VANCOMYCIN HCL IN DEXTROSE 750-5 MG/150ML-% IV SOLN
750.0000 mg | Freq: Two times a day (BID) | INTRAVENOUS | Status: DC
  Administered 2014-11-25 – 2014-11-28 (×7): 750 mg via INTRAVENOUS
  Filled 2014-11-24 (×7): qty 150

## 2014-11-24 NOTE — Progress Notes (Signed)
[facility] for Vancomycin & Zosyn  Indication: pneumonia  Allergies  Allergen Reactions  . Adhesive [Tape]     unknown  . Aspirin     unknown  . Codeine     unknown  . Enalapril Cough  . Hydrocodone     unknown  . Pantoprazole Sodium     unknown    Patient Measurements:   Last recorded weight: 87.5kg on 11/07/14  Vital Signs: Temp: 98 F (36.7 C) (10/21 0950) Temp Source: Oral (10/21 0950) BP: 179/73 mmHg (10/21 1522) Pulse Rate: 87 (10/21 1430) Intake/Output from previous day:   Intake/Output from this shift:    Labs:  Recent Labs  11/22/14 11/24/14 1013  WBC 17.8 17.9*  HGB 11.5* 11.8*  PLT 239 263  CREATININE 0.7 0.82   CrCl cannot be calculated (Unknown ideal weight.). No results for input(s): VANCOTROUGH, VANCOPEAK, VANCORANDOM, GENTTROUGH, GENTPEAK, GENTRANDOM, TOBRATROUGH, TOBRAPEAK, TOBRARND, AMIKACINPEAK, AMIKACINTROU, AMIKACIN in the last 72 hours.   Microbiology: No results found for this or any previous visit (from the past 720 hour(s)).  Anti-infectives    Start     Dose/Rate Route Frequency Ordered Stop   11/24/14 1445  vancomycin (VANCOCIN) 1,250 mg in sodium chloride 0.9 % 250 mL IVPB     1,250 mg 166.7 mL/hr over 90 Minutes Intravenous  Once 11/24/14 1440     11/24/14 1445  piperacillin-tazobactam (ZOSYN) IVPB 3.375 g     3.375 g 100 mL/hr over 30 Minutes Intravenous  Once 11/24/14 1440        Assessment: 81 yo F admitted from NH with worsening URI.  She was started on course of albuterol nebs, prednisone, and Avelox on 11/20/14.  She has COPD at baseline and uses O2 chronically, however needs have increased. WBC is elevated, however she remains afebrile and lactic acid level is wnl. Scr is increased slightly.  Estimated CrCl ~50-44ml/min.  *She was on Vancomycin 750mg  IV q12h in Oct 2015 with trough 11.8  10/17>>Avelox>>10/21 (at [facility]) 10/21>>Vanc>> 10/21>>Zosyn>>  10/21: Blood cx x2: IP  Goal of  Therapy:  Vancomycin trough level 15-20 mcg/ml  Plan:  Zosyn 3.375gm IV Q8h to be infused over 4hrs Vancomycin 750mg  IV q12h Check Vancomycin trough at steady state Monitor renal function and cx data   Biagio Borg 11/24/2014,3:42 PM

## 2014-11-24 NOTE — ED Notes (Signed)
Bed: EA30 Expected date:  Expected time:  Means of arrival:  Comments: EMS- 89s F, URI/SOB

## 2014-11-24 NOTE — H&P (Signed)
History and Physical:    [name]   QMG:[id number] DOB: 1917-12-07 DOA: 11/24/2014  Referring MD/provider: Debby Freiberg, MD PCP: Estill Dooms, MD   Chief Complaint: Cough  History of Present Illness:   [name] is an 81 y.o. female with a PMH of chronic respiratory failure on home oxygen, COPD under the care of Dr. Lake Bells, last seen in the office on 08/11/14 where he had recommended inhaled therapy but patient declined. She was given a pneumonia vaccine at that time. On 11/21/14, she was seen by her PCP with wheezing and cough. She was placed on Duonebs and prednisone at that time. She has required increased use of her home oxygen and despite treatment with prednisone, Avelox and bronchodilators, patient's symptoms have progressed.  The patient tells me she has not been on any inhalers since the summertime, despite a note by Man Mast X, NP indicating that she was prescribed inhalers.  She has had a cough with clear sputum.  She also reports low grade fevers.  She has tried Robitussin and lozenges which has helped her cough a bit.  ROS:   Review of Systems  Constitutional: Positive for fever and malaise/fatigue. Negative for chills and weight loss.  HENT: Positive for hearing loss. Negative for congestion and sore throat.   Eyes: Negative.   Respiratory: Positive for cough, sputum production, shortness of breath and wheezing. Negative for hemoptysis.   Cardiovascular: Positive for leg swelling. Negative for chest pain.  Gastrointestinal: Positive for diarrhea. Negative for heartburn, nausea, vomiting, constipation, blood in stool and melena.  Genitourinary: Positive for frequency.       Stress incontinence, nocturia  Musculoskeletal: Negative.   Skin: Negative.   Neurological: Positive for weakness and headaches. Negative for dizziness.  Endo/Heme/Allergies: Negative.   Psychiatric/Behavioral: Negative.      Past Medical History:   Past Medical History    Diagnosis Date  . Type II or unspecified type diabetes mellitus without mention of complication, not stated as uncontrolled   . Hypertension   . Arthritis   . COPD (chronic obstructive pulmonary disease) ([place])   . Regional enteritis of unspecified site   . Other malaise and fatigue   . Anemia, unspecified   . Other and unspecified hyperlipidemia   . Hypoxemia   . Pneumonia, organism unspecified   . Mucopurulent chronic bronchitis ([place])   . Palpitations   . Nontoxic uninodular goiter   . Abnormality of gait   . Fall from other slipping, tripping, or stumbling   . Cholelithiases   . Closed fracture of unspecified part of ramus of mandible   . Disturbance of skin sensation   . Sciatica   . Tension headache   . Coronary atherosclerosis of unspecified type of vessel, native or graft   . Osteoarthrosis, unspecified whether generalized or localized, unspecified site   . Insomnia, unspecified   . Edema   . Tension headache   . Pain in joint, lower leg 2010    right knee  . Unspecified venous (peripheral) insufficiency   . Esophageal reflux   . Rosacea   . Cataract 1990    Dr. Katy Fitch  . Pain in joint, shoulder region 2013    left  . Gastric ulcer with hemorrhage 2008  . Urine, incontinence, stress female 07/19/2013  . Bronchiectasis without acute exacerbation ([place]) 04/10/2011    CT chest 2011   . DOE (dyspnea on exertion) 04/10/2011    PFTs 2013:  FEV1 1.04 (78%), ratio  64, couldn't do maneuver for lung volumes or dlco   . Nodule of neck 11/07/2013    Left neck posteriorly   . Squamous cell carcinoma of skin of lower limb, including hip 07/27/2012    Nonhealing lesion of the left mid calf medially     Past Surgical History:   Past Surgical History  Procedure Laterality Date  . Total knee arthroplasty Bilateral 1993, 1998    knees  . Other surgical history  2008    Ulcer surgery   . Small intestine surgery    . Eye surgery Bilateral 1990    Dr. Katy Fitch  . Gastrojejunostomy   05/2004    secondary to ulcer  . Joint replacement Bilateral 1993-1998    total knee replacement  . Mole removal  05/11/14    left hand and left side of face [facility]    Social History:   Social History   Social History  . Marital Status: Widowed    Spouse Name: N/A  . Number of Children: 2  . Years of Education: N/A   Occupational History  . retired Agricultural engineer.     Social History Main Topics  . Smoking status: Former Smoker -- 0.30 packs/day for 10 years    Types: Cigarettes    Quit date: 02/03/1973  . Smokeless tobacco: Never Used     Comment: former casual smoker, lots of second hand smoke exposure  . Alcohol Use: No  . Drug Use: No  . Sexual Activity: No   Other Topics Concern  . Not on file   Social History Narrative   Pt lives at [facility] in the independent living section since 1994. Moved to [place] 12/08/2013   Pt lives alone.   Has a living will, POA, DNR   Power scooter and rolling walker   Exercise none   Never smoked   Alcohol none                    Family history:   Family History  Problem Relation Age of Onset  . Heart disease Father   . Colon cancer Sister   . Diabetes Brother   . Obesity Brother   . Mental retardation Daughter   . Obesity Daughter     Allergies   Adhesive; Aspirin; Codeine; Enalapril; Hydrocodone; and Pantoprazole sodium  Current Medications:   Prior to Admission medications   Medication Sig Start Date End Date Taking? Authorizing Provider  acetaminophen (TYLENOL) 325 MG tablet Take 650 mg by mouth every 6 (six) hours as needed for moderate pain or headache.   Yes Historical Provider, MD  acetaminophen (TYLENOL) 500 MG tablet Take 500 mg by mouth 2 (two) times daily.    Yes Historical Provider, MD  albuterol (PROVENTIL) (2.5 MG/3ML) 0.083% nebulizer solution Take 2.5 mg by nebulization 3 (three) times daily.   Yes Historical Provider, MD  atorvastatin (LIPITOR) 10 MG tablet Take 10 mg by mouth daily.    Yes Historical Provider, MD  Calcium Carbonate Antacid (CALCIUM ANTACID PO) Take 600 mg by mouth 3 (three) times daily as needed (heartburn).   Yes Historical Provider, MD  carvedilol (COREG) 6.25 MG tablet Take 6.25 mg by mouth 2 (two) times daily with a meal.   Yes Historical Provider, MD  cloNIDine (CATAPRES) 0.1 MG tablet Take 0.1 mg by mouth. Take one tablet as needed for bp > 160/100   Yes Historical Provider, MD  doxazosin (CARDURA) 8 MG tablet Take 8 mg by  mouth daily. To control BP.   Yes Historical Provider, MD  furosemide (LASIX) 20 MG tablet Take 20 mg by mouth daily.   Yes Historical Provider, MD  glucose blood test strip Use three time daily to test blood sugar. Contour Test Strips. Dx:E11.9 11/10/14  Yes Gildardo Cranker, DO  guaiFENesin (ROBITUSSIN) 100 MG/5ML liquid Take 200 mg by mouth. Take 10 ml every 6 hours as needed for cough   Yes Historical Provider, MD  insulin glargine (LANTUS) 100 UNIT/ML injection Inject 10 Units into the skin at bedtime.    Yes Historical Provider, MD  insulin lispro (HUMALOG) 100 UNIT/ML cartridge Inject 5-8 Units into the skin 3 (three) times daily before meals. Scheduled 5 units TID if blood sugar level is about 150 give an additional 3 units= 8units   Yes Historical Provider, MD  losartan (COZAAR) 50 MG tablet Take 50 mg by mouth daily.   Yes Historical Provider, MD  moxifloxacin (AVELOX) 400 MG tablet Take 400 mg by mouth daily at 8 pm.   Yes Historical Provider, MD  omeprazole (PRILOSEC) 20 MG capsule Take 20 mg by mouth daily.  02/14/14  Yes Historical Provider, MD  predniSONE (DELTASONE) 20 MG tablet Take 40 mg by mouth daily with breakfast.   Yes Historical Provider, MD  predniSONE (DELTASONE) 5 MG tablet 1/2 tablet daily to help breathing Patient taking differently: Take 5 mg by mouth daily.  07/11/14  Yes Estill Dooms, MD  promethazine (PHENERGAN) 25 MG tablet Take 25 mg by mouth every 4 (four) hours as needed for nausea or vomiting.   Yes  Historical Provider, MD  promethazine (PHENERGAN) 25 MG/ML injection Inject 25 mg into the vein every 4 (four) hours as needed for nausea or vomiting.   Yes Historical Provider, MD  saccharomyces boulardii (FLORASTOR) 250 MG capsule Take 250 mg by mouth 2 (two) times daily.   Yes Historical Provider, MD  spironolactone (ALDACTONE) 25 MG tablet Take 25 mg by mouth daily.   Yes Historical Provider, MD  metoprolol tartrate (LOPRESSOR) 25 MG tablet One twice daily to control blood pressure Patient not taking: Reported on 11/24/2014 03/09/14   Estill Dooms, MD  mirabegron ER (MYRBETRIQ) 50 MG TB24 tablet One daily to assist and bladder control Patient not taking: Reported on 11/24/2014 07/11/14   Estill Dooms, MD  predniSONE (DELTASONE) 10 MG tablet Take 10 mg by mouth daily with breakfast.    Historical Provider, MD    Physical Exam:   Filed Vitals:   11/24/14 1330 11/24/14 1400 11/24/14 1430 11/24/14 1522  BP: 150/57 141/56 152/65 179/73  Pulse: 75 82 87   Temp:      TempSrc:      Resp: 27 22 27    SpO2: 94% 92% 92%      Physical Exam: Blood pressure 179/73, pulse 87, temperature 98 F (36.7 C), temperature source Oral, resp. rate 27, SpO2 92 %. Gen: Mild respiratory distress. Head: Normocephalic, atraumatic. Eyes: PERRL, EOMI, sclerae nonicteric. Mouth: Oropharynx reveals some posterior pharyngeal erythema and swelling. Neck: Supple, no thyromegaly, no lymphadenopathy, no jugular venous distention. Chest: Lungs diminished with expiratory wheezes and increased work of breathing. CV: Heart sounds are regular. No murmurs, rubs, or gallops. Abdomen: Soft, nontender, nondistended with normal active bowel sounds. Extremities: Extremities are with trace edema, worse on the left. Skin: Warm and dry. Neuro: Alert and oriented times 3, nonfocal. Psych: Mood and affect normal.   Data Review:    Labs: Basic Metabolic Panel:  Recent Labs Lab 11/22/14 11/24/14 1013  NA 121* 125*  K  5.0 5.1  CL  --  89*  CO2  --  29  GLUCOSE  --  154*  BUN 20 27*  CREATININE 0.7 0.82  CALCIUM  --  9.2   Liver Function Tests:  Recent Labs Lab 11/22/14 11/24/14 1013  AST 16 23  ALT 17 24  ALKPHOS 103 102  BILITOT  --  0.5  PROT  --  6.3*  ALBUMIN  --  2.8*   No results for input(s): LIPASE, AMYLASE in the last 168 hours. No results for input(s): AMMONIA in the last 168 hours. CBC:  Recent Labs Lab 11/22/14 11/24/14 1013  WBC 17.8 17.9*  NEUTROABS  --  14.8*  HGB 11.5* 11.8*  HCT 34* 35.1*  MCV  --  90.7  PLT 239 263   Cardiac Enzymes: No results for input(s): CKTOTAL, CKMB, CKMBINDEX, TROPONINI in the last 168 hours.  BNP (last 3 results) No results for input(s): PROBNP in the last 8760 hours. CBG: No results for input(s): GLUCAP in the last 168 hours.  Radiographic Studies: Dg Chest 2 View  11/24/2014  CLINICAL DATA:  Cough, congestion, low-grade fever for 10 days. EXAM: CHEST  2 VIEW COMPARISON:  11/14/2013 FINDINGS: Interstitial prominence throughout the lungs has improved since prior study. Residual interstitial changes likely reflects underlying chronic lung disease. No confluent opacities or effusions. Heart is normal size. Diffuse calcifications throughout the aorta. Degenerative changes in the shoulders, left greater than right. IMPRESSION: Improving interstitial prominence with mild persistent interstitial prominence, likely chronic interstitial disease. Electronically Signed   By: Rolm Baptise M.D.   On: 11/24/2014 10:46   *I have personally reviewed the images above*  EKG: Independently reviewed. RBBB, 90 bpm.   Assessment/Plan:   Principal Problem:   Acute on chronic respiratory failure (HCC) secondary to bronchiectasis with acute exacerbation, mucopurulent chronic bronchitis and possible HCAP - Admit and place on IV steroids, scheduled bronchodilators, antitussives and empiric vancomycin/Zosyn. - Blood cultures obtained in the ED. Check sputum  cultures, strep pneumonia and legionella antigens.  Active Problems:   Type 2 diabetes with decreased circulation (HCC) - Continue Lantus. SSI, moderate scale, Q AC/HS. Most recent hemoglobin A1c stable at 7.6%.    Urine, incontinence, stress female/UTI - Check urine culture. On broad-spectrum antibiotics as above.    Hyponatremia - May be secondary to dehydration versus diuretics. Monitor.  - Hold diuretics.    GERD  - Patient has a history of bleeding ulcers. Continue PPI therapy. Mylanta ordered as needed.    Hypertension - Continue Cardura, Coreg, and losartan. Hold Lasix and spironolactone.    DVT prophylaxis - Lovenox ordered.   Attestation regarding necessity of inpatient status:   The appropriate admission status for this patient is INPATIENT. Inpatient status is judged to be reasonable and necessary in order to provide the required intensity of service to ensure the patient's safety. The patient's presenting symptoms, physical exam findings, and initial radiographic and laboratory data in the context of their chronic comorbidities is felt to place them at high risk for further clinical deterioration. Furthermore, it is not anticipated that the patient will be medically stable for discharge from the hospital within 2 midnights of admission. The following factors support the admission status of inpatient.   -The patient's presenting symptoms include dyspnea, cough, increased work of breathing. - The worrisome physical exam findings include tachypnea, increased work of breathing. - The initial radiographic and laboratory data  are worrisome because of hyponatremia, leukocytosis, interstitial lung disease. - The chronic co-morbidities include bronchiectasis, pulmonary hypertension, oxygen dependent chronic respiratory failure. - Patient requires inpatient status due to high intensity of service, high risk for further deterioration and high frequency of surveillance required. - I  certify that at the point of admission it is my clinical judgment that the patient will require inpatient hospital care spanning beyond 2 midnights from the point of admission.  Code Status: DNR Family Communication: No family currently at the bedside. Disposition Plan: From ALF ([place]). Anticipate a 3 day hospital stay.  Time spent: 70 minutes.  Aila Terra [facility] Pager [phone] Cell: [phone]   If 7PM-7AM, please contact night-coverage [email or web address] Password [facility] 11/24/2014, 3:33 PM

## 2014-11-24 NOTE — ED Notes (Signed)
Patient has been treated for a upper respiratory infection by mouth at her nursing facility. Patient had lab work that came back "abnormal" labs. Values are not known exactly. Nursing facility was concerned about the possibility of sepsis.

## 2014-11-24 NOTE — ED Notes (Signed)
Patient transported to X-ray 

## 2014-11-24 NOTE — ED Notes (Signed)
Dr. Rockne Menghini at bedside. Patient will be transported following this encounter.

## 2014-11-24 NOTE — ED Provider Notes (Signed)
CSN: [id number]     Arrival date & time 11/24/14  8366 History   First MD Initiated Contact with Patient 11/24/14 [phone]     Chief Complaint  Patient presents with  . Abnormal Lab     (Consider location/radiation/quality/duration/timing/severity/associated sxs/prior Treatment) Patient is a 81 y.o. female presenting with cough.  Cough Cough characteristics:  Non-productive Severity:  Moderate Onset quality:  Gradual Duration:  2 weeks Timing:  Constant Progression:  Worsening Chronicity:  New Context: upper respiratory infection   Relieved by:  Nothing Worsened by:  Nothing tried Associated symptoms: no chest pain, no chills, no fever and no rash     Past Medical History  Diagnosis Date  . Type II or unspecified type diabetes mellitus without mention of complication, not stated as uncontrolled   . Hypertension   . Arthritis   . COPD (chronic obstructive pulmonary disease) ([place])   . Regional enteritis of unspecified site   . Other malaise and fatigue   . Anemia, unspecified   . Other and unspecified hyperlipidemia   . Hypoxemia   . Pneumonia, organism unspecified   . Mucopurulent chronic bronchitis ([place])   . Palpitations   . Nontoxic uninodular goiter   . Abnormality of gait   . Fall from other slipping, tripping, or stumbling   . Cholelithiases   . Closed fracture of unspecified part of ramus of mandible   . Disturbance of skin sensation   . Sciatica   . Tension headache   . Coronary atherosclerosis of unspecified type of vessel, native or graft   . Osteoarthrosis, unspecified whether generalized or localized, unspecified site   . Insomnia, unspecified   . Edema   . Tension headache   . Pain in joint, lower leg 2010    right knee  . Unspecified venous (peripheral) insufficiency   . Esophageal reflux   . Rosacea   . Cataract 1990    Dr. Katy Fitch  . Pain in joint, shoulder region 2013    left  . Gastric ulcer with hemorrhage 2008  . Urine, incontinence, stress  female 07/19/2013  . Bronchiectasis without acute exacerbation ([place]) 04/10/2011    CT chest 2011   . DOE (dyspnea on exertion) 04/10/2011    PFTs 2013:  FEV1 1.04 (78%), ratio 64, couldn't do maneuver for lung volumes or dlco   . Nodule of neck 11/07/2013    Left neck posteriorly   . Squamous cell carcinoma of skin of lower limb, including hip 07/27/2012    Nonhealing lesion of the left mid calf medially    Past Surgical History  Procedure Laterality Date  . Total knee arthroplasty Bilateral 1993, 1998    knees  . Other surgical history  2008    Ulcer surgery   . Small intestine surgery    . Eye surgery Bilateral 1990    Dr. Katy Fitch  . Gastrojejunostomy  05/2004    secondary to ulcer  . Joint replacement Bilateral 1993-1998    total knee replacement  . Mole removal  05/11/14    left hand and left side of face [facility]   Family History  Problem Relation Age of Onset  . Heart disease Father   . Colon cancer Sister   . Diabetes Brother   . Obesity Brother   . Mental retardation Daughter   . Obesity Daughter    Social History  Substance Use Topics  . Smoking status: Former Smoker -- 0.30 packs/day for 10 years    Types: Cigarettes  Quit date: 02/03/1973  . Smokeless tobacco: Never Used     Comment: former casual smoker, lots of second hand smoke exposure  . Alcohol Use: No   OB History    No data available     Review of Systems  Constitutional: Negative for fever and chills.  Respiratory: Positive for cough.   Cardiovascular: Negative for chest pain.  Skin: Negative for rash.  All other systems reviewed and are negative.     Allergies  Adhesive; Aspirin; Codeine; Enalapril; Hydrocodone; and Pantoprazole sodium  Home Medications   Prior to Admission medications   Medication Sig Start Date End Date Taking? Authorizing Provider  acetaminophen (TYLENOL) 325 MG tablet Take 650 mg by mouth every 6 (six) hours as needed for moderate pain or headache.   Yes  Historical Provider, MD  acetaminophen (TYLENOL) 500 MG tablet Take 500 mg by mouth 2 (two) times daily.    Yes Historical Provider, MD  atorvastatin (LIPITOR) 10 MG tablet Take 10 mg by mouth daily.   Yes Historical Provider, MD  Calcium Carbonate Antacid (CALCIUM ANTACID PO) Take 600 mg by mouth 3 (three) times daily as needed (heartburn).   Yes Historical Provider, MD  carvedilol (COREG) 6.25 MG tablet Take 6.25 mg by mouth 2 (two) times daily with a meal.   Yes Historical Provider, MD  cloNIDine (CATAPRES) 0.1 MG tablet Take 0.1 mg by mouth. Take one tablet as needed for bp > 160/100   Yes Historical Provider, MD  doxazosin (CARDURA) 8 MG tablet Take 8 mg by mouth daily. To control BP.   Yes Historical Provider, MD  furosemide (LASIX) 20 MG tablet Take 20 mg by mouth daily.   Yes Historical Provider, MD  glucose blood test strip Use three time daily to test blood sugar. Contour Test Strips. Dx:E11.9 11/10/14  Yes Gildardo Cranker, DO  guaiFENesin (ROBITUSSIN) 100 MG/5ML liquid Take 200 mg by mouth. Take 10 ml every 6 hours as needed for cough   Yes Historical Provider, MD  insulin glargine (LANTUS) 100 UNIT/ML injection Inject 10 Units into the skin at bedtime.    Yes Historical Provider, MD  insulin lispro (HUMALOG) 100 UNIT/ML cartridge Inject 5-8 Units into the skin 3 (three) times daily before meals. Scheduled 5 units TID if blood sugar level is about 150 give an additional 3 units= 8units   Yes Historical Provider, MD  losartan (COZAAR) 50 MG tablet Take 50 mg by mouth daily.   Yes Historical Provider, MD  omeprazole (PRILOSEC) 20 MG capsule Take 20 mg by mouth daily.  02/14/14  Yes Historical Provider, MD  promethazine (PHENERGAN) 25 MG tablet Take 25 mg by mouth every 4 (four) hours as needed for nausea or vomiting.   Yes Historical Provider, MD  saccharomyces boulardii (FLORASTOR) 250 MG capsule Take 250 mg by mouth 2 (two) times daily.   Yes Historical Provider, MD  albuterol (PROVENTIL) (2.5  MG/3ML) 0.083% nebulizer solution Take 3 mLs (2.5 mg total) by nebulization 3 (three) times daily. 11/29/14   Theodis Blaze, MD  levofloxacin (LEVAQUIN) 500 MG tablet Take 1 tablet (500 mg total) by mouth daily. 11/29/14   Theodis Blaze, MD  mirabegron ER (MYRBETRIQ) 50 MG TB24 tablet One daily to assist and bladder control Patient not taking: Reported on 11/24/2014 07/11/14   Estill Dooms, MD  predniSONE (DELTASONE) 10 MG tablet Take 60 mg tablet and taper down by 10 mg daily until completed 11/29/14   Theodis Blaze, MD  BP 161/51 mmHg  Pulse 77  Temp(Src) 97.7 F (36.5 C) (Oral)  Resp 18  Ht 5\' 4"  (1.626 m)  Wt 187 lb 9.6 oz (85.095 kg)  BMI 32.19 kg/m2  SpO2 95% Physical Exam  Constitutional: She is oriented to person, place, and time. She appears well-developed and well-nourished.  HENT:  Head: Normocephalic and atraumatic.  Right Ear: External ear normal.  Left Ear: External ear normal.  Eyes: Conjunctivae and EOM are normal. Pupils are equal, round, and reactive to light.  Neck: Normal range of motion. Neck supple.  Cardiovascular: Normal rate, regular rhythm, normal heart sounds and intact distal pulses.   Pulmonary/Chest: Effort normal and breath sounds normal.  Abdominal: Soft. Bowel sounds are normal. There is no tenderness.  Musculoskeletal: Normal range of motion.  Neurological: She is alert and oriented to person, place, and time.  Skin: Skin is warm and dry.  Vitals reviewed.   ED Course  Procedures (including critical care time) Labs Review Labs Reviewed  COMPREHENSIVE METABOLIC PANEL - Abnormal; Notable for the following:    Sodium 125 (*)    Chloride 89 (*)    Glucose, Bld 154 (*)    BUN 27 (*)    Total Protein 6.3 (*)    Albumin 2.8 (*)    GFR calc non Af Amer 59 (*)    All other components within normal limits  CBC WITH DIFFERENTIAL/PLATELET - Abnormal; Notable for the following:    WBC 17.9 (*)    Hemoglobin 11.8 (*)    HCT 35.1 (*)    Neutro Abs  14.8 (*)    Monocytes Absolute 1.6 (*)    Basophils Absolute 0.2 (*)    All other components within normal limits  URINALYSIS, ROUTINE W REFLEX MICROSCOPIC (NOT AT [facility]) - Abnormal; Notable for the following:    APPearance CLOUDY (*)    Hgb urine dipstick LARGE (*)    Leukocytes, UA SMALL (*)    All other components within normal limits  BRAIN NATRIURETIC PEPTIDE - Abnormal; Notable for the following:    B Natriuretic Peptide 202.7 (*)    All other components within normal limits  URINE MICROSCOPIC-ADD ON - Abnormal; Notable for the following:    Squamous Epithelial / LPF FEW (*)    Bacteria, UA MANY (*)    All other components within normal limits  GLUCOSE, CAPILLARY - Abnormal; Notable for the following:    Glucose-Capillary 272 (*)    All other components within normal limits  GLUCOSE, CAPILLARY - Abnormal; Notable for the following:    Glucose-Capillary 203 (*)    All other components within normal limits  GLUCOSE, CAPILLARY - Abnormal; Notable for the following:    Glucose-Capillary 267 (*)    All other components within normal limits  GLUCOSE, CAPILLARY - Abnormal; Notable for the following:    Glucose-Capillary 364 (*)    All other components within normal limits  BASIC METABOLIC PANEL - Abnormal; Notable for the following:    Sodium 127 (*)    Chloride 86 (*)    Glucose, Bld 302 (*)    BUN 31 (*)    GFR calc non Af Amer 57 (*)    All other components within normal limits  CBC - Abnormal; Notable for the following:    WBC 25.2 (*)    HCT 35.8 (*)    All other components within normal limits  GLUCOSE, CAPILLARY - Abnormal; Notable for the following:    Glucose-Capillary 199 (*)  All other components within normal limits  GLUCOSE, CAPILLARY - Abnormal; Notable for the following:    Glucose-Capillary 188 (*)    All other components within normal limits  GLUCOSE, CAPILLARY - Abnormal; Notable for the following:    Glucose-Capillary 271 (*)    All other components  within normal limits  GLUCOSE, CAPILLARY - Abnormal; Notable for the following:    Glucose-Capillary 334 (*)    All other components within normal limits  GLUCOSE, CAPILLARY - Abnormal; Notable for the following:    Glucose-Capillary 254 (*)    All other components within normal limits  BASIC METABOLIC PANEL - Abnormal; Notable for the following:    Sodium 128 (*)    Potassium 5.3 (*)    Chloride 91 (*)    Glucose, Bld 188 (*)    BUN 31 (*)    Calcium 8.7 (*)    All other components within normal limits  GLUCOSE, CAPILLARY - Abnormal; Notable for the following:    Glucose-Capillary 193 (*)    All other components within normal limits  CBC - Abnormal; Notable for the following:    WBC 21.1 (*)    All other components within normal limits  GLUCOSE, CAPILLARY - Abnormal; Notable for the following:    Glucose-Capillary 311 (*)    All other components within normal limits  BASIC METABOLIC PANEL - Abnormal; Notable for the following:    Sodium 128 (*)    Chloride 90 (*)    Glucose, Bld 277 (*)    BUN 35 (*)    Calcium 8.6 (*)    GFR calc non Af Amer 55 (*)    All other components within normal limits  CBC - Abnormal; Notable for the following:    WBC 17.4 (*)    All other components within normal limits  GLUCOSE, CAPILLARY - Abnormal; Notable for the following:    Glucose-Capillary 197 (*)    All other components within normal limits  GLUCOSE, CAPILLARY - Abnormal; Notable for the following:    Glucose-Capillary 201 (*)    All other components within normal limits  GLUCOSE, CAPILLARY - Abnormal; Notable for the following:    Glucose-Capillary 239 (*)    All other components within normal limits  GLUCOSE, CAPILLARY - Abnormal; Notable for the following:    Glucose-Capillary 227 (*)    All other components within normal limits  GLUCOSE, CAPILLARY - Abnormal; Notable for the following:    Glucose-Capillary 320 (*)    All other components within normal limits  GLUCOSE,  CAPILLARY - Abnormal; Notable for the following:    Glucose-Capillary 183 (*)    All other components within normal limits  CBC - Abnormal; Notable for the following:    WBC 16.6 (*)    All other components within normal limits  BASIC METABOLIC PANEL - Abnormal; Notable for the following:    Sodium 129 (*)    Chloride 92 (*)    Glucose, Bld 216 (*)    BUN 27 (*)    All other components within normal limits  GLUCOSE, CAPILLARY - Abnormal; Notable for the following:    Glucose-Capillary 148 (*)    All other components within normal limits  GLUCOSE, CAPILLARY - Abnormal; Notable for the following:    Glucose-Capillary 198 (*)    All other components within normal limits  GLUCOSE, CAPILLARY - Abnormal; Notable for the following:    Glucose-Capillary 254 (*)    All other components within normal limits  GLUCOSE, CAPILLARY -  Abnormal; Notable for the following:    Glucose-Capillary 168 (*)    All other components within normal limits  CULTURE, BLOOD (ROUTINE X 2)  CULTURE, BLOOD (ROUTINE X 2)  URINE CULTURE  CULTURE, EXPECTORATED SPUTUM-ASSESSMENT  VANCOMYCIN, TROUGH  I-STAT CG4 LACTIC ACID, ED  Randolm Idol, ED  I-STAT CG4 LACTIC ACID, ED    Imaging Review Dg Swallowing Func-speech Pathology  11/28/2014  Objective Swallowing Evaluation:   Patient Details Name: AADHIRA HEFFERNAN MRN: [id number] Date of Birth: September 18, 1917 Today's Date: 11/28/2014 Time: SLP Start Time (ACUTE ONLY): 1340-SLP Stop Time (ACUTE ONLY): 1415 SLP Time Calculation (min) (ACUTE ONLY): 35 min Past Medical History: Past Medical History Diagnosis Date . Type II or unspecified type diabetes mellitus without mention of complication, not stated as uncontrolled  . Hypertension  . Arthritis  . COPD (chronic obstructive pulmonary disease) ([place])  . Regional enteritis of unspecified site  . Other malaise and fatigue  . Anemia, unspecified  . Other and unspecified hyperlipidemia  . Hypoxemia  . Pneumonia, organism unspecified   . Mucopurulent chronic bronchitis ([place])  . Palpitations  . Nontoxic uninodular goiter  . Abnormality of gait  . Fall from other slipping, tripping, or stumbling  . Cholelithiases  . Closed fracture of unspecified part of ramus of mandible  . Disturbance of skin sensation  . Sciatica  . Tension headache  . Coronary atherosclerosis of unspecified type of vessel, native or graft  . Osteoarthrosis, unspecified whether generalized or localized, unspecified site  . Insomnia, unspecified  . Edema  . Tension headache  . Pain in joint, lower leg 2010   right knee . Unspecified venous (peripheral) insufficiency  . Esophageal reflux  . Rosacea  . Cataract 1990   Dr. Katy Fitch . Pain in joint, shoulder region 2013   left . Gastric ulcer with hemorrhage 2008 . Urine, incontinence, stress female 07/19/2013 . Bronchiectasis without acute exacerbation ([place]) 04/10/2011   CT chest 2011  . DOE (dyspnea on exertion) 04/10/2011   PFTs 2013:  FEV1 1.04 (78%), ratio 64, couldn't do maneuver for lung volumes or dlco  . Nodule of neck 11/07/2013   Left neck posteriorly  . Squamous cell carcinoma of skin of lower limb, including hip 07/27/2012   Nonhealing lesion of the left mid calf medially  Past Surgical History: Past Surgical History Procedure Laterality Date . Total knee arthroplasty Bilateral 1993, 1998   knees . Other surgical history  2008   Ulcer surgery  . Small intestine surgery   . Eye surgery Bilateral 1990   Dr. Katy Fitch . Gastrojejunostomy  05/2004   secondary to ulcer . Joint replacement Bilateral 1993-1998   total knee replacement . Mole removal  05/11/14   left hand and left side of face [facility] HPI: Other Pertinent Information: 81 y.o. female with a PMH of chronic respiratory failure, COPD, GERD, pna, DM, gastric ulcer with hemorrhage. Per MD on 11/21/14 she was seen by her PCP with wheezing and cough, patient's symptoms have progressed.Found to have acute on chronic resp failure secondary to bronchiectasis with acute  exacerbation, mucopurulent chronic bronchitis and possible HCAP. CXR 10/21 Improving interstitial prominence with mild persistent interstitial prominence, likely chronic interstitial disease. Pt denied coughing with food/liquid. Comment on order states RN noted pt coughing with food in take.  No Data Recorded Assessment / Plan / Recommendation CHL IP CLINICAL IMPRESSIONS 11/28/2014 Therapy Diagnosis Mild oral phase dysphagia;Mild pharyngeal phase dysphagia Clinical Impression Pt without aspiration of any consistency tested *barium tablet,  cracker, pudding, nectar & thin.  Swallow was timely without severe residue.  Mild amount of oropharyngeal residuals WITHOUT pt sensation due to mild weakness. Cued dry swallows helpful to decrease residuals.  Barium tablet swallowed with thin liquid appeared to pause at mid-esophagus with referent sensation to left side of pharynx.  Further intake of liquids facilitated clearance.  Pt did cough prior to and during MBS without barium visualized in larynx/trachea. Mildly prominent cricopharyngeus noted.  Appearance of slow clearance at distal esophagus with esophagus appearing wide and patent- radiologist not present to confirm. Frequent belching noted throughout MBS without barium backflowing into pharynx.  Overt coughing with face turning red noted after pt returned to bed and HOB lowered that may indicate reflux issues.  Suspect primary aspiration concern is from reflux - pt does admit to consuming Tums daily at home.  Advised her to speak to MD regarding this matter.     CHL IP TREATMENT RECOMMENDATION 11/28/2014 Treatment Recommendations Therapy as outlined in treatment plan below   CHL IP DIET RECOMMENDATION 11/28/2014 SLP Diet Recommendations Dysphagia 3 (Mech soft);Thin Liquid Administration via (None) Medication Administration (No Data) Compensations Slow rate;Small sips/bites Postural Changes and/or Swallow Maneuvers (None)   CHL IP OTHER RECOMMENDATIONS 11/28/2014  Recommended Consults (None) Oral Care Recommendations Oral care BID Other Recommendations (None)   No flowsheet data found.  CHL IP FREQUENCY AND DURATION 11/28/2014 Speech Therapy Frequency (ACUTE ONLY) min 2x/week Treatment Duration 1 week   Pertinent Vitals/Pain  SLP Swallow Goals No flowsheet data found. No flowsheet data found.   CHL IP REASON FOR REFERRAL 11/28/2014 Reason for Referral Objectively evaluate swallowing function   CHL IP ORAL PHASE 11/28/2014 Lips (None) Tongue (None) Mucous membranes (None) Nutritional status (None) Other (None) Oxygen therapy (None) Oral Phase Impaired Oral - Pudding Teaspoon (None) Oral - Pudding Cup (None) Oral - Honey Teaspoon (None) Oral - Honey Cup (None) Oral - Honey Syringe (None) Oral - Nectar Teaspoon (None) Oral - Nectar Cup (None) Oral - Nectar Straw (None) Oral - Nectar Syringe (None) Oral - Ice Chips (None) Oral - Thin Teaspoon (None) Oral - Thin Cup (None) Oral - Thin Straw (None) Oral - Thin Syringe (None) Oral - Puree (None) Oral - Mechanical Soft (None) Oral - Regular (None) Oral - Multi-consistency (None) Oral - Pill (None) Oral Phase - Comment (None)   CHL IP PHARYNGEAL PHASE 11/28/2014 Pharyngeal Phase Impaired Pharyngeal - Pudding Teaspoon (None) Penetration/Aspiration details (pudding teaspoon) (None) Pharyngeal - Pudding Cup (None) Penetration/Aspiration details (pudding cup) (None) Pharyngeal - Honey Teaspoon (None) Penetration/Aspiration details (honey teaspoon) (None) Pharyngeal - Honey Cup (None) Penetration/Aspiration details (honey cup) (None) Pharyngeal - Honey Syringe (None) Penetration/Aspiration details (honey syringe) (None) Pharyngeal - Nectar Teaspoon (None) Penetration/Aspiration details (nectar teaspoon) (None) Pharyngeal - Nectar Cup (None) Penetration/Aspiration details (nectar cup) (None) Pharyngeal - Nectar Straw (None) Penetration/Aspiration details (nectar straw) (None) Pharyngeal - Nectar Syringe (None) Penetration/Aspiration details  (nectar syringe) (None) Pharyngeal - Ice Chips (None) Penetration/Aspiration details (ice chips) (None) Pharyngeal - Thin Teaspoon (None) Penetration/Aspiration details (thin teaspoon) (None) Pharyngeal - Thin Cup (None) Penetration/Aspiration details (thin cup) (None) Pharyngeal - Thin Straw (None) Penetration/Aspiration details (thin straw) (None) Pharyngeal - Thin Syringe (None) Penetration/Aspiration details (thin syringe') (None) Pharyngeal - Puree (None) Penetration/Aspiration details (puree) (None) Pharyngeal - Mechanical Soft (None) Penetration/Aspiration details (mechanical soft) (None) Pharyngeal - Regular (None) Penetration/Aspiration details (regular) (None) Pharyngeal - Multi-consistency (None) Penetration/Aspiration details (multi-consistency) (None) Pharyngeal - Pill (None) Penetration/Aspiration details (pill) (None) Pharyngeal Comment pt with mild lingual  base residuals without full sensation, cued dry swallows helpful to decrease residue   CHL IP CERVICAL ESOPHAGEAL PHASE 11/28/2014 Cervical Esophageal Phase Impaired Pudding Teaspoon (None) Pudding Cup (None) Honey Teaspoon (None) Honey Cup (None) Honey Straw (None) Nectar Teaspoon (None) Nectar Cup (None) Nectar Straw (None) Nectar Sippy Cup (None) Thin Teaspoon (None) Thin Cup (None) Thin Straw (None) Thin Sippy Cup (None) Cervical Esophageal Comment appearance of prominent CP No flowsheet data found.   Luanna Salk, [place] [facility] SLP [phone]   I have personally reviewed and evaluated these images and lab results as part of my medical decision-making.   EKG Interpretation   Date/Time:  Friday November 24 2014 12:19:07 EDT Ventricular Rate:  90 PR Interval:  155 QRS Duration: 130 QT Interval:  367 QTC Calculation: 449 R Axis:   76 Text Interpretation:  Sinus rhythm Right bundle branch block rate has  decreased since last tracing Confirmed by Debby Freiberg [phone]) on  11/24/2014 3:20:45 PM      MDM   Final diagnoses:  None     .81 y.o. female with pertinent PMH of COPD, CAD, DM, HTN presents with cough x 2 weeks.  Febrile to 101.  Had bloodwork with leukocytosis at lab draw earlier this week.  On arrival vitals and physical exam as above.  Pt well appearing.  Elba Barman demonstrated normal CXR, however labs concerning and pt with consistent dyspnea.  Admitted in stable condition  I have reviewed all laboratory and imaging studies if ordered as above  No diagnosis found.      Debby Freiberg, MD 11/30/14 [phone]

## 2014-11-24 NOTE — ED Notes (Signed)
Patient's daughter in law Leeba Barbe called to check on patient. Mrs. Mabe gives Pryor Curia permission to update Reinette Cuneo on her status. Room number given to her per patient's request.

## 2014-11-24 NOTE — Progress Notes (Signed)
This encounter was created in error - please disregard.

## 2014-11-24 NOTE — Progress Notes (Signed)
Pt in in the AL at [facility]

## 2014-11-24 NOTE — ED Notes (Signed)
Patient's purse and shoes sent with her to the floor.

## 2014-11-24 NOTE — ED Notes (Signed)
EXTRA BLUE, GOLD, AND 1 SET OF CULTURES SENT TO THE LAB.

## 2014-11-25 LAB — GLUCOSE, CAPILLARY
Glucose-Capillary: 267 mg/dL — ABNORMAL HIGH (ref 65–99)
Glucose-Capillary: 364 mg/dL — ABNORMAL HIGH (ref 65–99)
Glucose-Capillary: 199 mg/dL — ABNORMAL HIGH (ref 65–99)
Glucose-Capillary: 188 mg/dL — ABNORMAL HIGH (ref 65–99)

## 2014-11-25 LAB — EXPECTORATED SPUTUM ASSESSMENT W GRAM STAIN, RFLX TO RESP C: Special Requests: NORMAL

## 2014-11-25 MED ORDER — METHYLPREDNISOLONE SODIUM SUCC 40 MG IJ SOLR
40.0000 mg | Freq: Two times a day (BID) | INTRAMUSCULAR | Status: DC
  Administered 2014-11-25 – 2014-11-27 (×4): 40 mg via INTRAVENOUS
  Filled 2014-11-25 (×6): qty 1

## 2014-11-25 MED ORDER — IPRATROPIUM-ALBUTEROL 0.5-2.5 (3) MG/3ML IN SOLN
3.0000 mL | Freq: Four times a day (QID) | RESPIRATORY_TRACT | Status: DC
  Administered 2014-11-25 – 2014-11-27 (×8): 3 mL via RESPIRATORY_TRACT
  Filled 2014-11-25 (×8): qty 3

## 2014-11-25 MED ORDER — INSULIN ASPART 100 UNIT/ML ~~LOC~~ SOLN
6.0000 [IU] | Freq: Three times a day (TID) | SUBCUTANEOUS | Status: DC
  Administered 2014-11-25 – 2014-11-26 (×3): 6 [IU] via SUBCUTANEOUS

## 2014-11-25 MED ORDER — INSULIN ASPART 100 UNIT/ML ~~LOC~~ SOLN
0.0000 [IU] | Freq: Three times a day (TID) | SUBCUTANEOUS | Status: DC
  Administered 2014-11-25: 4 [IU] via SUBCUTANEOUS
  Administered 2014-11-25: 20 [IU] via SUBCUTANEOUS
  Administered 2014-11-26: 11 [IU] via SUBCUTANEOUS
  Administered 2014-11-26: 15 [IU] via SUBCUTANEOUS
  Administered 2014-11-26: 11 [IU] via SUBCUTANEOUS
  Administered 2014-11-27: 7 [IU] via SUBCUTANEOUS
  Administered 2014-11-27: 4 [IU] via SUBCUTANEOUS
  Administered 2014-11-27: 15 [IU] via SUBCUTANEOUS
  Administered 2014-11-28: 7 [IU] via SUBCUTANEOUS
  Administered 2014-11-28: 15 [IU] via SUBCUTANEOUS
  Administered 2014-11-28: 4 [IU] via SUBCUTANEOUS
  Administered 2014-11-29: 11 [IU] via SUBCUTANEOUS
  Administered 2014-11-29: 4 [IU] via SUBCUTANEOUS

## 2014-11-25 MED ORDER — INSULIN ASPART 100 UNIT/ML ~~LOC~~ SOLN
0.0000 [IU] | Freq: Every day | SUBCUTANEOUS | Status: DC
  Administered 2014-11-27: 2 [IU] via SUBCUTANEOUS

## 2014-11-25 NOTE — Progress Notes (Signed)
Progress Note   [name]:[id number] DOB: 1917/06/10 DOA: 11/24/2014 PCP: Estill Dooms, MD   Brief Narrative:   [name] is an 81 y.o. female with a PMH of chronic respiratory failure on home oxygen, COPD under the care of Dr. Lake Bells, last seen in the office on 08/11/14 where he had recommended inhaled therapy but patient declined. She was given a pneumonia vaccine at that time. On 11/21/14, she was seen by her PCP with wheezing and cough. She was placed on Duonebs and prednisone at that time. She has required increased use of her home oxygen and despite treatment with prednisone, Avelox and bronchodilators, patient's symptoms have progressed and she was admitted 11/24/14 with acute on chronic respiratory failure.   Assessment/Plan:   Principal Problem:  Acute on chronic respiratory failure (HCC) secondary to bronchiectasis with acute exacerbation, mucopurulent chronic bronchitis and possible HCAP - Continue IV steroids, wean as tolerated. - Continue scheduled bronchodilators, antitussives and empiric vancomycin/Zosyn. - Follow-up Blood cultures, sputum cultures, strep pneumonia and legionella antigens.  Active Problems:  Type 2 diabetes with decreased circulation (HCC) - Currently being managed with Lantus and SSI, moderate scale, Q AC/HS. Most recent hemoglobin A1c stable at 7.6%. - CBGs elevated, 203-272. Change SSI to resistant scale with 6 units of meal coverage.   Urine, incontinence, stress female/UTI - Follow-up urine culture. On broad-spectrum antibiotics as above.   Hyponatremia - May be secondary to dehydration versus diuretics. Monitor.  - Sodium improving with holding diuretics.   GERD  - Patient has a history of bleeding ulcers. Continue PPI therapy. Mylanta ordered as needed.   Hypertension - Continue Cardura, Coreg, and losartan. Hold Lasix and spironolactone.   DVT prophylaxis - Lovenox ordered.   Code Status: DNR Family  Communication: No family currently at the bedside. Declines my offer to call. Disposition Plan: From ALF ([place]). Anticipate D/C in 24-48 hours, depending on cultures/resolution of dyspnea.  IV Access:    Peripheral IV   Procedures and diagnostic studies:   Dg Chest 2 View  11/24/2014  CLINICAL DATA:  Cough, congestion, low-grade fever for 10 days. EXAM: CHEST  2 VIEW COMPARISON:  11/14/2013 FINDINGS: Interstitial prominence throughout the lungs has improved since prior study. Residual interstitial changes likely reflects underlying chronic lung disease. No confluent opacities or effusions. Heart is normal size. Diffuse calcifications throughout the aorta. Degenerative changes in the shoulders, left greater than right. IMPRESSION: Improving interstitial prominence with mild persistent interstitial prominence, likely chronic interstitial disease. Electronically Signed   By: Rolm Baptise M.D.   On: 11/24/2014 10:46     Medical Consultants:    None.  Anti-Infectives:   Anti-infectives    Start     Dose/Rate Route Frequency Ordered Stop   11/25/14 0600  vancomycin (VANCOCIN) IVPB 750 mg/150 ml premix     750 mg 150 mL/hr over 60 Minutes Intravenous Every 12 hours 11/24/14 1602     11/24/14 2359  piperacillin-tazobactam (ZOSYN) IVPB 3.375 g     3.375 g 12.5 mL/hr over 240 Minutes Intravenous Every 8 hours 11/24/14 1602     11/24/14 1445  vancomycin (VANCOCIN) 1,250 mg in sodium chloride 0.9 % 250 mL IVPB     1,250 mg 166.7 mL/hr over 90 Minutes Intravenous  Once 11/24/14 1440 11/24/14 1800   11/24/14 1445  piperacillin-tazobactam (ZOSYN) IVPB 3.375 g     3.375 g 100 mL/hr over 30 Minutes Intravenous  Once 11/24/14 1440 11/24/14 1740  Subjective:   AMIREE NO feels a bit better.  Still dyspneic but less so.  Occasional productive cough.  Appetite fair.  No pain.  + urinary incontinence with coughing spells.  Appetite fair.  No BM in last 24  hours.  Objective:    Filed Vitals:   11/24/14 1953 11/24/14 2129 11/25/14 0504 11/25/14 0758  BP:  136/62 166/90   Pulse:  100 89   Temp:  97.6 F (36.4 C) 98.2 F (36.8 C)   TempSrc:  Oral Oral   Resp:  22 20   Height:      Weight:      SpO2: 92% 94% 91% 91%    Intake/Output Summary (Last 24 hours) at 11/25/14 0935 Last data filed at 11/25/14 0758  Gross per 24 hour  Intake    686 ml  Output      0 ml  Net    686 ml   Filed Weights   11/24/14 1551  Weight: 85.095 kg (187 lb 9.6 oz)    Exam: Gen:  NAD Cardiovascular:  RRR, No M/R/G Respiratory:  Lungs course but decreases WOB Gastrointestinal:  Abdomen soft, NT/ND, + BS Extremities:  Trace edema   Data Reviewed:    Labs: Basic Metabolic Panel:  Recent Labs Lab 11/22/14 11/24/14 1013  NA 121* 125*  K 5.0 5.1  CL  --  89*  CO2  --  29  GLUCOSE  --  154*  BUN 20 27*  CREATININE 0.7 0.82  CALCIUM  --  9.2   GFR Estimated Creatinine Clearance: 43.3 mL/min (by C-G formula based on Cr of 0.82). Liver Function Tests:  Recent Labs Lab 11/22/14 11/24/14 1013  AST 16 23  ALT 17 24  ALKPHOS 103 102  BILITOT  --  0.5  PROT  --  6.3*  ALBUMIN  --  2.8*   CBC:  Recent Labs Lab 11/22/14 11/24/14 1013  WBC 17.8 17.9*  NEUTROABS  --  14.8*  HGB 11.5* 11.8*  HCT 34* 35.1*  MCV  --  90.7  PLT 239 263   CBG:  Recent Labs Lab 11/24/14 1711 11/24/14 2128 11/25/14 0731  GLUCAP 272* 203* 267*   Sepsis Labs:  Recent Labs Lab 11/22/14 11/24/14 1013 11/24/14 1016 11/24/14 1255  WBC 17.8 17.9*  --   --   LATICACIDVEN  --   --  1.55 0.77   Microbiology Recent Results (from the past 240 hour(s))  Blood culture (routine x 2)     Status: None (Preliminary result)   Collection Time: 11/24/14  2:23 PM  Result Value Ref Range Status   Specimen Description BLOOD RIGHT FOREARM  Final   Special Requests BOTTLES DRAWN AEROBIC AND ANAEROBIC 5 CC EA  Final   Culture   Final    NO GROWTH < 24  HOURS Performed at [facility]    Report Status PENDING  Incomplete  Blood culture (routine x 2)     Status: None (Preliminary result)   Collection Time: 11/24/14  3:00 PM  Result Value Ref Range Status   Specimen Description BLOOD LEFT HAND  Final   Special Requests BOTTLES DRAWN AEROBIC AND ANAEROBIC 5 CC EA  Final   Culture   Final    NO GROWTH < 24 HOURS Performed at [facility]    Report Status PENDING  Incomplete  Culture, sputum-assessment     Status: None   Collection Time: 11/25/14  5:01 AM  Result Value Ref Range  Status   Specimen Description SPUTUM  Final   Special Requests Normal  Final   Sputum evaluation   Final    MICROSCOPIC FINDINGS SUGGEST THAT THIS SPECIMEN IS NOT REPRESENTATIVE OF LOWER RESPIRATORY SECRETIONS. PLEASE RECOLLECT. CALLED TO R RAMANDO @ 9628 ON 11/25/14 BY C DAVIS    Report Status 11/25/2014 FINAL  Final     Medications:   . atorvastatin  10 mg Oral q1800  . carvedilol  6.25 mg Oral BID WC  . doxazosin  8 mg Oral Daily  . enoxaparin (LOVENOX) injection  40 mg Subcutaneous Q24H  . guaiFENesin  600 mg Oral BID  . insulin aspart  0-20 Units Subcutaneous TID WC  . insulin aspart  0-5 Units Subcutaneous QHS  . insulin aspart  6 Units Subcutaneous TID WC  . insulin glargine  10 Units Subcutaneous QHS  . ipratropium-albuterol  3 mL Nebulization TID  . losartan  50 mg Oral Daily  . methylPREDNISolone (SOLU-MEDROL) injection  40 mg Intravenous Q12H  . omeprazole  20 mg Oral Daily  . piperacillin-tazobactam (ZOSYN)  IV  3.375 g Intravenous Q8H  . saccharomyces boulardii  250 mg Oral BID  . sodium chloride  3 mL Intravenous Q12H  . vancomycin  750 mg Intravenous Q12H   Continuous Infusions:   Time spent: 25 minutes.   LOS: 1 day   Karli Wickizer  [facility] Pager [phone]. If unable to reach me by pager, please call my cell phone at [phone].  *Please refer to [email or web address], password [email or web address] to get updated schedule on who  will round on this patient, as hospitalists switch teams weekly. If 7PM-7AM, please contact night-coverage at [email or web address], password [email or web address] for any overnight needs.  11/25/2014, 9:35 AM

## 2014-11-25 NOTE — Clinical Social Work Note (Signed)
Clinical Social Work Assessment  Patient Details  Name: [name] MRN: [id number] Date of Birth: 11-18-1917  Date of referral:  11/25/14               Reason for consult:  Discharge Planning                Permission sought to share information with:  Family Supports Permission granted to share information::  Yes, Verbal Permission Granted  Name::     Engineer, maintenance::     Relationship::  son  Contact Information:     Housing/Transportation Living arrangements for the past 2 months:  [place] of Information:  Patient Patient Interpreter Needed:  None Criminal Activity/Legal Involvement Pertinent to Current Situation/Hospitalization:  No - Comment as needed Significant Relationships:  Adult Children Lives with:  Facility Resident Do you feel safe going back to the place where you live?  Yes Need for family participation in patient care:  No (Coment)  Care giving concerns:  Patient wants to return to ALF.   Social Worker assessment / plan:  Patient is from [facility] and lived in independent living for 20 years. Patient moved to ALF in December 2015. Patient reports she has worked with PT and prefers to return to ALF with [facility] if needed. CSW explained process and patient reports she and family agree on plans. CSW completed FL2 and placed in chart for MD signature.  Employment status:  Retired Nurse, adult PT Recommendations:  [place], [place], Home with [place] / Referral to community resources:  Other (Comment Required) (Will return to ALF)  Patient/Family's Response to care:  Patient reports she is feeling better but is worried about DC too soon.  Patient/Family's Understanding of and Emotional Response to Diagnosis, Current Treatment, and Prognosis:  Patient reports her health has declined and that is why she needed ALF. Patient reports she feels she has stabilized but does  not want SNF placement.   Emotional Assessment Appearance:  Appears stated age Attitude/Demeanor/Rapport:  Other (Cooperative) Affect (typically observed):  Appropriate Orientation:  Oriented to Self, Oriented to Place, Oriented to  Time Alcohol / Substance use:  Not Applicable Psych involvement (Current and /or in the community):  No (Comment)  Discharge Needs  Concerns to be addressed:  No discharge needs identified Readmission within the last 30 days:  No Current discharge risk:  None Barriers to Discharge:  No Barriers Identified   Boone Master, [place] 11/25/2014, 12:46 PM Weekend Coverage [phone]

## 2014-11-25 NOTE — Evaluation (Signed)
Physical Therapy Evaluation Patient Details Name: [name] MRN: [id number] DOB: 10-10-1917 Today's Date: 11/25/2014   History of Present Illness  [name] is an 81 y.o. female adm with acute on chronic repiratory failure;  PMHx:  chronic respiratory failure on home oxygen, COPD, DM, HTN  Clinical Impression  Pt admitted with above diagnosis. Pt currently with functional limitations due to the deficits listed below (see PT Problem List).  Pt will benefit from skilled PT to increase their independence and safety with mobility to allow discharge to the venue listed below.  D/C plan mostly dependent on progress, pt is limited amb at her baseline, uses scooter for longer distances;      Follow Up Recommendations SNF;Home health PT (may need to return to [facility] vs [facility] [place])    Equipment Recommendations  None recommended by PT    Recommendations for Other Services       Precautions / Restrictions Precautions Precautions: Fall      Mobility  Bed Mobility Overal bed mobility: Needs Assistance Bed Mobility: Supine to Sit     Supine to sit: Min assist     General bed mobility comments: assist with trunk to come to full upright, incr time  Transfers Overall transfer level: Needs assistance Equipment used: Rolling walker (2 wheeled);None Transfers: Sit to/from [employer] to Stand: Min assist Stand pivot transfers: Min assist       General transfer comment: incr time, cues for technique and safety--hand placement and not sitting too soon  Ambulation/Gait Ambulation/Gait assistance: Min guard;Min assist Ambulation Distance (Feet): 6 Feet Assistive device: Rolling walker (2 wheeled) Gait Pattern/deviations: Trunk flexed;Narrow base of support;Step-to pattern;Decreased step length - right;Decreased step length - left Gait velocity: Pt sats 97% at rest on 3L, desats to 80s with extertion, returned to >91% at rest with pursed lip  breathing   General Gait Details: incr time, cues for RW distance from self, posture and breathing  Stairs            Wheelchair Mobility    Modified Rankin (Stroke Patients Only)       Balance Overall balance assessment: Needs assistance Sitting-balance support: No upper extremity supported;Feet supported Sitting balance-Leahy Scale: Fair     Standing balance support: During functional activity;Single extremity supported Standing balance-Leahy Scale: Poor Standing balance comment: pt cannot maintain static without UE support                             Pertinent Vitals/Pain Pain Assessment: No/denies pain    Home Living Family/patient expects to be discharged to:: Private residence Living Arrangements: Alone     Home Access: Level entry     Home Layout: One level Home Equipment: Transport planner Additional Comments: [facility]isted [facility] at [facility]    Prior Function Level of Independence: Needs assistance   Gait / Transfers Assistance Needed: mod I in room with 3wheeled walker, uses scooter for longer distances  ADL's / Homemaking Assistance Needed: assistance with bathing, I dressing        Hand Dominance        Extremity/Trunk Assessment   Upper Extremity Assessment: Generalized weakness           Lower Extremity Assessment: Generalized weakness         Communication   Communication: HOH  Cognition Arousal/Alertness: Awake/alert Behavior During Therapy: WFL for tasks assessed/performed Overall Cognitive Status: Within Functional Limits for tasks assessed  General Comments General comments (skin integrity, edema, etc.): pt lying in urnine soaked bed, c/o not having had bath; allowed pt to sit on [facility] and pt stood for pericare, gave pt wash cloth for face and hands     Exercises        Assessment/Plan    PT Assessment Patient needs continued PT services  PT Diagnosis Difficulty walking   PT  Problem List Decreased strength;Decreased activity tolerance;Decreased balance;Decreased mobility;Cardiopulmonary status limiting activity  PT Treatment Interventions DME instruction;Gait training;Functional mobility training;Therapeutic activities;Therapeutic exercise;Patient/family education   PT Goals (Current goals can be found in the Care Plan section) Acute Rehab PT Goals Patient Stated Goal: none stated PT Goal Formulation: With patient Time For Goal Achievement: 12/06/14 Potential to Achieve Goals: Good    Frequency Min 3X/week   Barriers to discharge        Co-evaluation               End of Session Equipment Utilized During Treatment: Gait belt Activity Tolerance: Patient limited by fatigue Patient left: in chair;with call bell/phone within reach;with chair alarm set Nurse Communication: Mobility status         Time: 0301-3143 PT Time Calculation (min) (ACUTE ONLY): 30 min   Charges:   PT Evaluation $Initial PT Evaluation Tier I: 1 Procedure PT Treatments $Therapeutic Activity: 8-22 mins   PT G Codes:        Sussan Meter 12/01/2014, 11:45 AM

## 2014-11-26 LAB — CBC
HEMATOCRIT: 35.8 % — AB (ref 36.0–46.0)
HEMOGLOBIN: 12 g/dL (ref 12.0–15.0)
MCH: 30.3 pg (ref 26.0–34.0)
MCHC: 33.5 g/dL (ref 30.0–36.0)
MCV: 90.4 fL (ref 78.0–100.0)
Platelets: 293 10*3/uL (ref 150–400)
RBC: 3.96 MIL/uL (ref 3.87–5.11)
RDW: 13.8 % (ref 11.5–15.5)
WBC: 25.2 10*3/uL — ABNORMAL HIGH (ref 4.0–10.5)

## 2014-11-26 LAB — URINE CULTURE

## 2014-11-26 LAB — BASIC METABOLIC PANEL
ANION GAP: 10 (ref 5–15)
BUN: 31 mg/dL — ABNORMAL HIGH (ref 6–20)
CHLORIDE: 86 mmol/L — AB (ref 101–111)
CO2: 31 mmol/L (ref 22–32)
Calcium: 9.1 mg/dL (ref 8.9–10.3)
Creatinine, Ser: 0.84 mg/dL (ref 0.44–1.00)
GFR calc non Af Amer: 57 mL/min — ABNORMAL LOW (ref >60)
GLUCOSE: 302 mg/dL — AB (ref 65–99)
POTASSIUM: 4.8 mmol/L (ref 3.5–5.1)
Sodium: 127 mmol/L — ABNORMAL LOW (ref 135–145)

## 2014-11-26 LAB — GLUCOSE, CAPILLARY
GLUCOSE-CAPILLARY: 271 mg/dL — AB (ref 65–99)
GLUCOSE-CAPILLARY: 254 mg/dL — AB (ref 65–99)
GLUCOSE-CAPILLARY: 193 mg/dL — AB (ref 65–99)
Glucose-Capillary: 334 mg/dL — ABNORMAL HIGH (ref 65–99)

## 2014-11-26 MED ORDER — INSULIN ASPART 100 UNIT/ML ~~LOC~~ SOLN
8.0000 [IU] | Freq: Three times a day (TID) | SUBCUTANEOUS | Status: DC
  Administered 2014-11-26 – 2014-11-29 (×10): 8 [IU] via SUBCUTANEOUS

## 2014-11-26 NOTE — Progress Notes (Signed)
Progress Note   [name] SLH:[id number] DOB: 16-Mar-1917 DOA: 11/24/2014 PCP: Estill Dooms, MD   Brief Narrative:   [name] is an 81 y.o. female with a PMH of chronic respiratory failure on home oxygen, COPD under the care of Dr. Lake Bells, last seen in the office on 08/11/14 where he had recommended inhaled therapy but patient declined. She was given a pneumonia vaccine at that time. On 11/21/14, she was seen by her PCP with wheezing and cough. She was placed on Duonebs and prednisone at that time. She has required increased use of her home oxygen and despite treatment with prednisone, Avelox and bronchodilators, patient's symptoms have progressed and she was admitted 11/24/14 with acute on chronic respiratory failure.   Assessment/Plan:   Principal Problem:  Acute on chronic respiratory failure (HCC) secondary to bronchiectasis with acute exacerbation, mucopurulent chronic bronchitis and possible HCAP - Continue IV steroids, wean as tolerated. - Continue scheduled bronchodilators, antitussives and empiric vancomycin/Zosyn. - Follow-up Blood cultures, sputum cultures, strep pneumonia and legionella antigens.  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 Most recent hemoglobin A1c stable at 7.6%. - CBGs elevated, 188-364. Continue SSI, resistant scale and increase meal coverage to 8 units.   Urine, incontinence, stress female/UTI - Follow-up urine culture. On broad-spectrum antibiotics as above.   Hyponatremia - May be secondary to dehydration versus diuretics. Monitor.  - Sodium improving with holding diuretics.   GERD  - Patient has a history of bleeding ulcers. Continue PPI therapy. Mylanta ordered as needed.   Hypertension - Continue Cardura, Coreg, and losartan. Hold Lasix and spironolactone.   DVT prophylaxis - Lovenox  ordered.   Code Status: DNR Family Communication: No family currently at the bedside. Declines my offer to call. Disposition Plan: From ALF ([place]). Anticipate D/C in 24-48 hours, depending on cultures/resolution of dyspnea.  IV Access:    Peripheral IV   Procedures and diagnostic studies:   Dg Chest 2 View  11/24/2014  CLINICAL DATA:  Cough, congestion, low-grade fever for 10 days. EXAM: CHEST  2 VIEW COMPARISON:  11/14/2013 FINDINGS: Interstitial prominence throughout the lungs has improved since prior study. Residual interstitial changes likely reflects underlying chronic lung disease. No confluent opacities or effusions. Heart is normal size. Diffuse calcifications throughout the aorta. Degenerative changes in the shoulders, left greater than right. IMPRESSION: Improving interstitial prominence with mild persistent interstitial prominence, likely chronic interstitial disease. Electronically Signed   By: Rolm Baptise M.D.   On: 11/24/2014 10:46     Medical Consultants:    None.  Anti-Infectives:   Anti-infectives    Start     Dose/Rate Route Frequency Ordered Stop   11/25/14 0600  vancomycin (VANCOCIN) IVPB 750 mg/150 ml premix     750 mg 150 mL/hr over 60 Minutes Intravenous Every 12 hours 11/24/14 1602     11/24/14 2359  piperacillin-tazobactam (ZOSYN) IVPB 3.375 g     3.375 g 12.5 mL/hr over 240 Minutes Intravenous Every 8 hours 11/24/14 1602     11/24/14 1445  vancomycin (VANCOCIN) 1,250 mg in sodium chloride 0.9 % 250 mL IVPB     1,250 mg 166.7 mL/hr over 90 Minutes Intravenous  Once 11/24/14 1440 11/24/14 1800   11/24/14 1445  piperacillin-tazobactam (ZOSYN) IVPB 3.375 g     3.375 g 100 mL/hr over 30 Minutes Intravenous  Once 11/24/14 1440 11/24/14  1740      Subjective:   [name] reports improvement in respiratory status. Nursing staff report that the patient coughs after meal intake. They also report that she sleeps throughout the day in  between meals. No complaints of pain. No nausea or vomiting. Appetite is good.  Objective:    Filed Vitals:   11/25/14 2151 11/26/14 0614 11/26/14 0802 11/26/14 0822  BP:  173/76  136/46  Pulse:  87  91  Temp:  98.1 F (36.7 C)    TempSrc:  Oral    Resp:  22  20  Height:      Weight:      SpO2: 95% 96% 91% 93%    Intake/Output Summary (Last 24 hours) at 11/26/14 0823 Last data filed at 11/26/14 0615  Gross per 24 hour  Intake   1090 ml  Output      0 ml  Net   1090 ml   Filed Weights   11/24/14 1551  Weight: 85.095 kg (187 lb 9.6 oz)    Exam: Gen:  NAD Cardiovascular:  RRR, No M/R/G Respiratory:  Lungs course with occasional wheeze Gastrointestinal:  Abdomen soft, NT/ND, + BS Extremities:  Trace edema   Data Reviewed:    Labs: Basic Metabolic Panel:  Recent Labs Lab 11/22/14 11/24/14 1013 11/26/14 0505  NA 121* 125* 127*  K 5.0 5.1 4.8  CL  --  89* 86*  CO2  --  29 31  GLUCOSE  --  154* 302*  BUN 20 27* 31*  CREATININE 0.7 0.82 0.84  CALCIUM  --  9.2 9.1   GFR Estimated Creatinine Clearance: 42.3 mL/min (by C-G formula based on Cr of 0.84). Liver Function Tests:  Recent Labs Lab 11/22/14 11/24/14 1013  AST 16 23  ALT 17 24  ALKPHOS 103 102  BILITOT  --  0.5  PROT  --  6.3*  ALBUMIN  --  2.8*   CBC:  Recent Labs Lab 11/22/14 11/24/14 1013 11/26/14 0505  WBC 17.8 17.9* 25.2*  NEUTROABS  --  14.8*  --   HGB 11.5* 11.8* 12.0  HCT 34* 35.1* 35.8*  MCV  --  90.7 90.4  PLT 239 263 293   CBG:  Recent Labs Lab 11/25/14 0731 11/25/14 1129 11/25/14 1717 11/25/14 2154 11/26/14 0725  GLUCAP 267* 364* 199* 188* 271*   Sepsis Labs:  Recent Labs Lab 11/22/14 11/24/14 1013 11/24/14 1016 11/24/14 1255 11/26/14 0505  WBC 17.8 17.9*  --   --  25.2*  LATICACIDVEN  --   --  1.55 0.77  --    Microbiology Recent Results (from the past 240 hour(s))  Blood culture (routine x 2)     Status: None (Preliminary result)   Collection Time:  11/24/14  2:23 PM  Result Value Ref Range Status   Specimen Description BLOOD RIGHT FOREARM  Final   Special Requests BOTTLES DRAWN AEROBIC AND ANAEROBIC 5 CC EA  Final   Culture   Final    NO GROWTH < 24 HOURS Performed at [facility]    Report Status PENDING  Incomplete  Blood culture (routine x 2)     Status: None (Preliminary result)   Collection Time: 11/24/14  3:00 PM  Result Value Ref Range Status   Specimen Description BLOOD LEFT HAND  Final   Special Requests BOTTLES DRAWN AEROBIC AND ANAEROBIC 5 CC EA  Final   Culture   Final    NO GROWTH < 24 HOURS Performed  at [facility]    Report Status PENDING  Incomplete  Urine culture     Status: None (Preliminary result)   Collection Time: 11/24/14  3:43 PM  Result Value Ref Range Status   Specimen Description URINE, CLEAN CATCH  Final   Special Requests NONE  Final   Culture   Final    CULTURE REINCUBATED FOR BETTER GROWTH Performed at [facility]    Report Status PENDING  Incomplete  Culture, sputum-assessment     Status: None   Collection Time: 11/25/14  5:01 AM  Result Value Ref Range Status   Specimen Description SPUTUM  Final   Special Requests Normal  Final   Sputum evaluation   Final    MICROSCOPIC FINDINGS SUGGEST THAT THIS SPECIMEN IS NOT REPRESENTATIVE OF LOWER RESPIRATORY SECRETIONS. PLEASE RECOLLECT. CALLED TO R RAMANDO @ 7530 ON 11/25/14 BY C DAVIS    Report Status 11/25/2014 FINAL  Final     Medications:   . atorvastatin  10 mg Oral q1800  . carvedilol  6.25 mg Oral BID WC  . doxazosin  8 mg Oral Daily  . enoxaparin (LOVENOX) injection  40 mg Subcutaneous Q24H  . guaiFENesin  600 mg Oral BID  . insulin aspart  0-20 Units Subcutaneous TID WC  . insulin aspart  0-5 Units Subcutaneous QHS  . insulin aspart  6 Units Subcutaneous TID WC  . insulin glargine  10 Units Subcutaneous QHS  . ipratropium-albuterol  3 mL Nebulization QID  . losartan  50 mg Oral Daily  .  methylPREDNISolone (SOLU-MEDROL) injection  40 mg Intravenous Q12H  . omeprazole  20 mg Oral Daily  . piperacillin-tazobactam (ZOSYN)  IV  3.375 g Intravenous Q8H  . saccharomyces boulardii  250 mg Oral BID  . sodium chloride  3 mL Intravenous Q12H  . vancomycin  750 mg Intravenous Q12H   Continuous Infusions:   Time spent: 25 minutes.   LOS: 2 days   RAMA,CHRISTINA  [facility] Pager [phone]. If unable to reach me by pager, please call my cell phone at [phone].  *Please refer to [email or web address], password [email or web address] to get updated schedule on who will round on this patient, as hospitalists switch teams weekly. If 7PM-7AM, please contact night-coverage at [email or web address], password [email or web address] for any overnight needs.  11/26/2014, 8:23 AM

## 2014-11-26 NOTE — Progress Notes (Signed)
[place] for Vancomycin & Zosyn  Indication: pneumonia  Allergies  Allergen Reactions  . Adhesive [Tape]     unknown  . Aspirin     unknown  . Codeine     unknown  . Enalapril Cough  . Hydrocodone     unknown  . Pantoprazole Sodium     unknown    Patient Measurements: Height: 5\' 4"  (162.6 cm) Weight: 187 lb 9.6 oz (85.095 kg) IBW/kg (Calculated) : 54.7  Vital Signs: Temp: 98.1 F (36.7 C) (10/23 0614) Temp Source: Oral (10/23 8841) BP: 167/68 mmHg (10/23 1100) Pulse Rate: 91 (10/23 0822) Intake/Output from previous day: 10/22 0701 - 10/23 0700 In: 1330 [P.O.:1080; IV Piggyback:250] Out: -  Intake/Output from this shift: Total I/O In: 240 [P.O.:240] Out: -   Labs:  Recent Labs  11/24/14 1013 11/26/14 0505  WBC 17.9* 25.2*  HGB 11.8* 12.0  PLT 263 293  CREATININE 0.82 0.84   Estimated Creatinine Clearance: 42.3 mL/min (by C-G formula based on Cr of 0.84). No results for input(s): VANCOTROUGH, VANCOPEAK, VANCORANDOM, GENTTROUGH, GENTPEAK, GENTRANDOM, TOBRATROUGH, TOBRAPEAK, TOBRARND, AMIKACINPEAK, AMIKACINTROU, AMIKACIN in the last 72 hours.   Microbiology: Recent Results (from the past 720 hour(s))  Blood culture (routine x 2)     Status: None (Preliminary result)   Collection Time: 11/24/14  2:23 PM  Result Value Ref Range Status   Specimen Description BLOOD RIGHT FOREARM  Final   Special Requests BOTTLES DRAWN AEROBIC AND ANAEROBIC 5 CC EA  Final   Culture   Final    NO GROWTH 2 DAYS Performed at [facility]    Report Status PENDING  Incomplete  Blood culture (routine x 2)     Status: None (Preliminary result)   Collection Time: 11/24/14  3:00 PM  Result Value Ref Range Status   Specimen Description BLOOD LEFT HAND  Final   Special Requests BOTTLES DRAWN AEROBIC AND ANAEROBIC 5 CC EA  Final   Culture   Final    NO GROWTH 2 DAYS Performed at [facility]    Report Status PENDING  Incomplete   Urine culture     Status: None   Collection Time: 11/24/14  3:43 PM  Result Value Ref Range Status   Specimen Description URINE, CLEAN CATCH  Final   Special Requests NONE  Final   Culture   Final    MULTIPLE SPECIES PRESENT, SUGGEST RECOLLECTION Performed at [facility]    Report Status 11/26/2014 FINAL  Final  Culture, sputum-assessment     Status: None   Collection Time: 11/25/14  5:01 AM  Result Value Ref Range Status   Specimen Description SPUTUM  Final   Special Requests Normal  Final   Sputum evaluation   Final    MICROSCOPIC FINDINGS SUGGEST THAT THIS SPECIMEN IS NOT REPRESENTATIVE OF LOWER RESPIRATORY SECRETIONS. PLEASE RECOLLECT. CALLED TO R RAMANDO @ [phone] ON 11/25/14 BY C DAVIS    Report Status 11/25/2014 FINAL  Final    Assessment: 81 yo F with PMH of chronic respiratory failure on home O2, COPD who was placed on duonebs, prednisone, and Avelox for wheezing and cough. However, her symptoms progressed, and patient was admitted to [facility] on 10/21. UA on admission cloudy with many bacteria, few leukocytes. Pharmacy consulted to assist with dosing of Vancomycin and Zosyn for possible PNA and UTI.  10/17 >> Avelox>>10/21 (at [facility]) 10/21 >> Vanc >> 10/21 >> Zosyn >>  10/21 blood x 2: NGTD  10/21 urine: multiple species present, suggest recollection 10/22 sputum: not representative of lower respiratory secretions  Goal of Therapy:  Vancomycin trough level 15-20 mcg/ml  Appropriate antibiotic dosing for renal function and indication Eradication of infection  Plan:   Continue Zosyn 3.375g IV q8h, each dose to be infused over 4 hours.  Continue Vancomycin 750mg  IV q12h.  Check Vancomycin trough level at steady state on 10/24 AM.  Monitor renal function, cultures, clinical course.     Lindell Spar, PharmD, BCPS Pager: [phone] 11/26/2014 1:21 PM

## 2014-11-27 LAB — CBC
HCT: 38.7 % (ref 36.0–46.0)
HEMOGLOBIN: 13.2 g/dL (ref 12.0–15.0)
MCH: 30.8 pg (ref 26.0–34.0)
MCHC: 34.1 g/dL (ref 30.0–36.0)
MCV: 90.4 fL (ref 78.0–100.0)
PLATELETS: 274 10*3/uL (ref 150–400)
RBC: 4.28 MIL/uL (ref 3.87–5.11)
RDW: 13.8 % (ref 11.5–15.5)
WBC: 21.1 10*3/uL — ABNORMAL HIGH (ref 4.0–10.5)

## 2014-11-27 LAB — VANCOMYCIN, TROUGH: Vancomycin Tr: 15 ug/mL (ref 10.0–20.0)

## 2014-11-27 LAB — BASIC METABOLIC PANEL
Anion gap: 8 (ref 5–15)
BUN: 31 mg/dL — AB (ref 6–20)
CALCIUM: 8.7 mg/dL — AB (ref 8.9–10.3)
CO2: 29 mmol/L (ref 22–32)
Chloride: 91 mmol/L — ABNORMAL LOW (ref 101–111)
Creatinine, Ser: 0.76 mg/dL (ref 0.44–1.00)
GFR calc Af Amer: >60 mL/min (ref >60)
GLUCOSE: 188 mg/dL — AB (ref 65–99)
POTASSIUM: 5.3 mmol/L — AB (ref 3.5–5.1)
Sodium: 128 mmol/L — ABNORMAL LOW (ref 135–145)

## 2014-11-27 LAB — GLUCOSE, CAPILLARY
GLUCOSE-CAPILLARY: 311 mg/dL — AB (ref 65–99)
GLUCOSE-CAPILLARY: 197 mg/dL — AB (ref 65–99)
GLUCOSE-CAPILLARY: 239 mg/dL — AB (ref 65–99)
Glucose-Capillary: 201 mg/dL — ABNORMAL HIGH (ref 65–99)

## 2014-11-27 MED ORDER — IPRATROPIUM-ALBUTEROL 0.5-2.5 (3) MG/3ML IN SOLN
3.0000 mL | Freq: Three times a day (TID) | RESPIRATORY_TRACT | Status: DC
  Administered 2014-11-27 – 2014-11-29 (×7): 3 mL via RESPIRATORY_TRACT
  Filled 2014-11-27 (×7): qty 3

## 2014-11-27 MED ORDER — PREDNISONE 50 MG PO TABS
60.0000 mg | ORAL_TABLET | Freq: Every day | ORAL | Status: DC
  Administered 2014-11-28 – 2014-11-29 (×2): 60 mg via ORAL
  Filled 2014-11-27 (×3): qty 1

## 2014-11-27 NOTE — Progress Notes (Signed)
[place] for Vancomycin & Zosyn  Indication: pneumonia  Allergies  Allergen Reactions  . Adhesive [Tape]     unknown  . Aspirin     unknown  . Codeine     unknown  . Enalapril Cough  . Hydrocodone     unknown  . Pantoprazole Sodium     unknown    Patient Measurements: Height: 5\' 4"  (162.6 cm) Weight: 187 lb 9.6 oz (85.095 kg) IBW/kg (Calculated) : 54.7  Vital Signs: Temp: 97.6 F (36.4 C) (10/24 0629) Temp Source: Oral (10/24 0629) BP: 171/72 mmHg (10/24 0629) Pulse Rate: 76 (10/24 0629) Intake/Output from previous day: 10/23 0701 - 10/24 0700 In: 1210 [P.O.:960; IV Piggyback:250] Out: -  Intake/Output from this shift:    Labs:  Recent Labs  11/24/14 1013 11/26/14 0505  WBC 17.9* 25.2*  HGB 11.8* 12.0  PLT 263 293  CREATININE 0.82 0.84   Estimated Creatinine Clearance: 42.3 mL/min (by C-G formula based on Cr of 0.84). No results for input(s): VANCOTROUGH, VANCOPEAK, VANCORANDOM, GENTTROUGH, GENTPEAK, GENTRANDOM, TOBRATROUGH, TOBRAPEAK, TOBRARND, AMIKACINPEAK, AMIKACINTROU, AMIKACIN in the last 72 hours.   Microbiology: Recent Results (from the past 720 hour(s))  Blood culture (routine x 2)     Status: None (Preliminary result)   Collection Time: 11/24/14  2:23 PM  Result Value Ref Range Status   Specimen Description BLOOD RIGHT FOREARM  Final   Special Requests BOTTLES DRAWN AEROBIC AND ANAEROBIC 5 CC EA  Final   Culture   Final    NO GROWTH 2 DAYS Performed at [facility]    Report Status PENDING  Incomplete  Blood culture (routine x 2)     Status: None (Preliminary result)   Collection Time: 11/24/14  3:00 PM  Result Value Ref Range Status   Specimen Description BLOOD LEFT HAND  Final   Special Requests BOTTLES DRAWN AEROBIC AND ANAEROBIC 5 CC EA  Final   Culture   Final    NO GROWTH 2 DAYS Performed at [facility]    Report Status PENDING  Incomplete  Urine culture     Status: None   Collection Time: 11/24/14  3:43 PM  Result Value Ref Range Status   Specimen Description URINE, CLEAN CATCH  Final   Special Requests NONE  Final   Culture   Final    MULTIPLE SPECIES PRESENT, SUGGEST RECOLLECTION Performed at [facility]    Report Status 11/26/2014 FINAL  Final  Culture, sputum-assessment     Status: None   Collection Time: 11/25/14  5:01 AM  Result Value Ref Range Status   Specimen Description SPUTUM  Final   Special Requests Normal  Final   Sputum evaluation   Final    MICROSCOPIC FINDINGS SUGGEST THAT THIS SPECIMEN IS NOT REPRESENTATIVE OF LOWER RESPIRATORY SECRETIONS. PLEASE RECOLLECT. CALLED TO R RAMANDO @ [phone] ON 11/25/14 BY C DAVIS    Report Status 11/25/2014 FINAL  Final    Assessment: 81 yo F with PMH of chronic respiratory failure on home O2, COPD who was placed on duonebs, prednisone, and Avelox for wheezing and cough. However, her symptoms progressed, and patient was admitted to [facility] on 10/21. UA on admission cloudy with many bacteria, few leukocytes. Pharmacy consulted to assist with dosing of Vancomycin and Zosyn for possible PNA and UTI.  10/17 >> Avelox>>10/21 (at [facility]) 10/21 >> Vanc >> 10/21 >> Zosyn >>  10/21 blood x 2: NGTD 10/21 urine: multiple species present, suggest recollection  10/22 sputum: not representative of lower respiratory secretions  Goal of Therapy:  Vancomycin trough level 15-20 mcg/ml  Appropriate antibiotic dosing for renal function and indication Eradication of infection  Plan:   Continue Zosyn 3.375g IV q8h, each dose to be infused over 4 hours.  Vancomycin trough is 15 mcg/ml.  SCr stable.  Continue Vancomycin 750mg  IV q12h.   Royetta Asal, PharmD, BCPS Pager: [phone] 11/27/2014 08:04

## 2014-11-27 NOTE — Progress Notes (Addendum)
Progress Note   [name] EZM:[id number] DOB: 03-23-1917 DOA: 11/24/2014 PCP: Estill Dooms, MD   Brief Narrative:   [name] is an 81 y.o. female with a PMH of chronic respiratory failure on home oxygen, COPD under the care of Dr. Lake Bells, last seen in the office on 08/11/14 where he had recommended inhaled therapy but patient declined. She was given a pneumonia vaccine at that time. On 11/21/14, she was seen by her PCP with wheezing and cough. She was placed on Duonebs and prednisone at that time. She has required increased use of her home oxygen and despite treatment with prednisone, Avelox and bronchodilators, patient's symptoms have progressed and she was admitted 11/24/14 with acute on chronic respiratory failure.   Assessment/Plan:   Principal Problem:  Acute on chronic respiratory failure (HCC) secondary to bronchiectasis with acute exacerbation, mucopurulent chronic bronchitis and possible HCAP - Continue IV steroids, wean as tolerated. Will switch to oral prednisone 11/28/14. - Continue scheduled bronchodilators, antitussives and empiric vancomycin/Zosyn. - Follow-up Blood cultures, sputum cultures.  Active Problems:   Hyperkalemia - Likely a hemolyzed specimen.  - No history of significant renal dysfunction, not on any medication that would raise potassium. - Repeat potassium in the morning.    R/O Dysphagia - Seen by Speech therapist, on dysphagia diet with nectar thickened liquids. - Barium swallow ordered for 11/28/14.   Type 2 diabetes with decreased circulation (HCC) / steroid-induced hyperglycemia - Currently being managed with Lantus and SSI, moderate scale, Q AC/HS. Most recent hemoglobin A1c stable at 7.6%. - CBGs elevated, 188-364. Continue SSI, resistant scale and increase meal coverage to 8 units.   Urine, incontinence, stress female/UTI - Follow-up urine culture. On broad-spectrum antibiotics as above.   Hyponatremia - May be  secondary to dehydration versus diuretics. Monitor.  - Sodium improving with holding diuretics.   GERD  - Patient has a history of bleeding ulcers. Continue PPI therapy. Mylanta ordered as needed.   Hypertension - Continue Cardura, Coreg, and losartan. Hold Lasix and spironolactone.   DVT prophylaxis - Lovenox ordered.   Code Status: DNR Family Communication: No family currently at the bedside. Declines my offer to call. Disposition Plan: From ALF ([place]). Anticipate D/C in 24-48 hours, depending on cultures/resolution of dyspnea.  IV Access:    Peripheral IV   Procedures and diagnostic studies:   Dg Chest 2 View  11/24/2014  CLINICAL DATA:  Cough, congestion, low-grade fever for 10 days. EXAM: CHEST  2 VIEW COMPARISON:  11/14/2013 FINDINGS: Interstitial prominence throughout the lungs has improved since prior study. Residual interstitial changes likely reflects underlying chronic lung disease. No confluent opacities or effusions. Heart is normal size. Diffuse calcifications throughout the aorta. Degenerative changes in the shoulders, left greater than right. IMPRESSION: Improving interstitial prominence with mild persistent interstitial prominence, likely chronic interstitial disease. Electronically Signed   By: Rolm Baptise M.D.   On: 11/24/2014 10:46     Medical Consultants:    None.  Anti-Infectives:   Anti-infectives    Start     Dose/Rate Route Frequency Ordered Stop   11/25/14 0600  vancomycin (VANCOCIN) IVPB 750 mg/150 ml premix     750 mg 150 mL/hr over 60 Minutes Intravenous Every 12 hours 11/24/14 1602     11/24/14 2359  piperacillin-tazobactam (ZOSYN) IVPB 3.375 g     3.375 g 12.5 mL/hr over 240 Minutes Intravenous Every 8 hours 11/24/14 1602     11/24/14 1445  vancomycin (VANCOCIN)  1,250 mg in sodium chloride 0.9 % 250 mL IVPB     1,250 mg 166.7 mL/hr over 90 Minutes Intravenous  Once 11/24/14 1440 11/24/14 1800   11/24/14 1445   piperacillin-tazobactam (ZOSYN) IVPB 3.375 g     3.375 g 100 mL/hr over 30 Minutes Intravenous  Once 11/24/14 1440 11/24/14 1740      Subjective:   [name] reports that her breathing is getting better, still has a cough.  Woke up at 4:30 am and could not get back to sleep.  The stress incontinence with coughing spells is bothersome to her.  No chest pain.  Objective:    Filed Vitals:   11/26/14 2014 11/26/14 2150 11/27/14 0629 11/27/14 0812  BP:  154/58 171/72   Pulse:  71 76 75  Temp:  97.5 F (36.4 C) 97.6 F (36.4 C)   TempSrc:  Oral Oral   Resp:  19 21 19   Height:      Weight:      SpO2: 96% 97% 96% 95%    Intake/Output Summary (Last 24 hours) at 11/27/14 1052 Last data filed at 11/27/14 0813  Gross per 24 hour  Intake   1280 ml  Output      0 ml  Net   1280 ml   Filed Weights   11/24/14 1551  Weight: 85.095 kg (187 lb 9.6 oz)    Exam: Gen:  NAD Cardiovascular:  RRR, No M/R/G Respiratory:  Lungs course with occasional wheeze Gastrointestinal:  Abdomen soft, NT/ND, + BS Extremities:  Trace edema   Data Reviewed:    Labs: Basic Metabolic Panel:  Recent Labs Lab 11/22/14 11/24/14 1013 11/26/14 0505 11/27/14 0540  NA 121* 125* 127* 128*  K 5.0 5.1 4.8 5.3*  CL  --  89* 86* 91*  CO2  --  29 31 29   GLUCOSE  --  154* 302* 188*  BUN 20 27* 31* 31*  CREATININE 0.7 0.82 0.84 0.76  CALCIUM  --  9.2 9.1 8.7*   GFR Estimated Creatinine Clearance: 44.4 mL/min (by C-G formula based on Cr of 0.76). Liver Function Tests:  Recent Labs Lab 11/22/14 11/24/14 1013  AST 16 23  ALT 17 24  ALKPHOS 103 102  BILITOT  --  0.5  PROT  --  6.3*  ALBUMIN  --  2.8*   CBC:  Recent Labs Lab 11/22/14 11/24/14 1013 11/26/14 0505 11/27/14 0812  WBC 17.8 17.9* 25.2* 21.1*  NEUTROABS  --  14.8*  --   --   HGB 11.5* 11.8* 12.0 13.2  HCT 34* 35.1* 35.8* 38.7  MCV  --  90.7 90.4 90.4  PLT 239 263 293 274   CBG:  Recent Labs Lab 11/25/14 2154  11/26/14 0725 11/26/14 1130 11/26/14 1636 11/26/14 2148  GLUCAP 188* 271* 334* 254* 193*   Sepsis Labs:  Recent Labs Lab 11/22/14 11/24/14 1013 11/24/14 1016 11/24/14 1255 11/26/14 0505 11/27/14 0812  WBC 17.8 17.9*  --   --  25.2* 21.1*  LATICACIDVEN  --   --  1.55 0.77  --   --    Microbiology Recent Results (from the past 240 hour(s))  Blood culture (routine x 2)     Status: None (Preliminary result)   Collection Time: 11/24/14  2:23 PM  Result Value Ref Range Status   Specimen Description BLOOD RIGHT FOREARM  Final   Special Requests BOTTLES DRAWN AEROBIC AND ANAEROBIC 5 CC EA  Final   Culture   Final  NO GROWTH 2 DAYS Performed at [facility]    Report Status PENDING  Incomplete  Blood culture (routine x 2)     Status: None (Preliminary result)   Collection Time: 11/24/14  3:00 PM  Result Value Ref Range Status   Specimen Description BLOOD LEFT HAND  Final   Special Requests BOTTLES DRAWN AEROBIC AND ANAEROBIC 5 CC EA  Final   Culture   Final    NO GROWTH 2 DAYS Performed at [facility]    Report Status PENDING  Incomplete  Urine culture     Status: None   Collection Time: 11/24/14  3:43 PM  Result Value Ref Range Status   Specimen Description URINE, CLEAN CATCH  Final   Special Requests NONE  Final   Culture   Final    MULTIPLE SPECIES PRESENT, SUGGEST RECOLLECTION Performed at [facility]    Report Status 11/26/2014 FINAL  Final  Culture, sputum-assessment     Status: None   Collection Time: 11/25/14  5:01 AM  Result Value Ref Range Status   Specimen Description SPUTUM  Final   Special Requests Normal  Final   Sputum evaluation   Final    MICROSCOPIC FINDINGS SUGGEST THAT THIS SPECIMEN IS NOT REPRESENTATIVE OF LOWER RESPIRATORY SECRETIONS. PLEASE RECOLLECT. CALLED TO R RAMANDO @ [phone] ON 11/25/14 BY C DAVIS    Report Status 11/25/2014 FINAL  Final     Medications:   . atorvastatin  10 mg Oral q1800  . carvedilol  6.25  mg Oral BID WC  . doxazosin  8 mg Oral Daily  . enoxaparin (LOVENOX) injection  40 mg Subcutaneous Q24H  . guaiFENesin  600 mg Oral BID  . insulin aspart  0-20 Units Subcutaneous TID WC  . insulin aspart  0-5 Units Subcutaneous QHS  . insulin aspart  8 Units Subcutaneous TID WC  . insulin glargine  10 Units Subcutaneous QHS  . ipratropium-albuterol  3 mL Nebulization TID  . losartan  50 mg Oral Daily  . methylPREDNISolone (SOLU-MEDROL) injection  40 mg Intravenous Q12H  . omeprazole  20 mg Oral Daily  . piperacillin-tazobactam (ZOSYN)  IV  3.375 g Intravenous Q8H  . saccharomyces boulardii  250 mg Oral BID  . sodium chloride  3 mL Intravenous Q12H  . vancomycin  750 mg Intravenous Q12H   Continuous Infusions:   Time spent: 25 minutes.   LOS: 3 days   [place] [facility] Pager [phone]. If unable to reach me by pager, please call my cell phone at [phone].  *Please refer to [email or web address], password [email or web address] to get updated schedule on who will round on this patient, as hospitalists switch teams weekly. If 7PM-7AM, please contact night-coverage at [email or web address], password [email or web address] for any overnight needs.  11/27/2014, 10:52 AM

## 2014-11-27 NOTE — Evaluation (Addendum)
Clinical/Bedside Swallow Evaluation Patient Details  Name: [name] MRN: [id number] Date of Birth: 1917-02-19  Today's Date: 11/27/2014 Time: SLP Start Time (ACUTE ONLY): 4401 SLP Stop Time (ACUTE ONLY): 0838 SLP Time Calculation (min) (ACUTE ONLY): 14 min  Past Medical History:  Past Medical History  Diagnosis Date  . Type II or unspecified type diabetes mellitus without mention of complication, not stated as uncontrolled   . Hypertension   . Arthritis   . COPD (chronic obstructive pulmonary disease) ([place])   . Regional enteritis of unspecified site   . Other malaise and fatigue   . Anemia, unspecified   . Other and unspecified hyperlipidemia   . Hypoxemia   . Pneumonia, organism unspecified   . Mucopurulent chronic bronchitis ([place])   . Palpitations   . Nontoxic uninodular goiter   . Abnormality of gait   . Fall from other slipping, tripping, or stumbling   . Cholelithiases   . Closed fracture of unspecified part of ramus of mandible   . Disturbance of skin sensation   . Sciatica   . Tension headache   . Coronary atherosclerosis of unspecified type of vessel, native or graft   . Osteoarthrosis, unspecified whether generalized or localized, unspecified site   . Insomnia, unspecified   . Edema   . Tension headache   . Pain in joint, lower leg 2010    right knee  . Unspecified venous (peripheral) insufficiency   . Esophageal reflux   . Rosacea   . Cataract 1990    Dr. Katy Fitch  . Pain in joint, shoulder region 2013    left  . Gastric ulcer with hemorrhage 2008  . Urine, incontinence, stress female 07/19/2013  . Bronchiectasis without acute exacerbation ([place]) 04/10/2011    CT chest 2011   . DOE (dyspnea on exertion) 04/10/2011    PFTs 2013:  FEV1 1.04 (78%), ratio 64, couldn't do maneuver for lung volumes or dlco   . Nodule of neck 11/07/2013    Left neck posteriorly   . Squamous cell carcinoma of skin of lower limb, including hip 07/27/2012    Nonhealing lesion of the  left mid calf medially    Past Surgical History:  Past Surgical History  Procedure Laterality Date  . Total knee arthroplasty Bilateral 1993, 1998    knees  . Other surgical history  2008    Ulcer surgery   . Small intestine surgery    . Eye surgery Bilateral 1990    Dr. Katy Fitch  . Gastrojejunostomy  05/2004    secondary to ulcer  . Joint replacement Bilateral 1993-1998    total knee replacement  . Mole removal  05/11/14    left hand and left side of face [facility]   HPI:  81 y.o. female with a PMH of chronic respiratory failure, COPD, GERD, pna, DM, gastric ulcer with hemorrhage. Per MD on 11/21/14 she was seen by her PCP with wheezing and cough, patient's symptoms have progressed.Found to have acute on chronic resp failure secondary to bronchiectasis with acute exacerbation, mucopurulent chronic bronchitis and possible HCAP. CXR 10/21 Improving interstitial prominence with mild persistent interstitial prominence, likely chronic interstitial disease. Pt denied coughing with food/liquid. Comment on order states RN noted pt coughing with food in take.    Assessment / Plan / Recommendation Clinical Impression  Swallow function suspicious for dysfunction (pharyngeal and or esophageal) evidenced by dyspnea following swallows, decreased coordination of swallow and respiration (? inhalation following swallow), subtle observations of runny nose/watery  eyes. Verbal cues for small bites warranted. Audible coughing coming from pt's room 5 minutes after BSE completion in addition to consistent belching during assessment possibly due to esophageal impairment. MBS warranted to observe swallow function, benefit of compensatory strategies. Increased risk for silent aspiration with COPD. MBS unable to be performed today and will be 10/25. As precaution  until MBS recommend Dys 3, nectar liquids with hopeful return to thin following MBS. Pt educated to remain upright minimum 30 min after meals.      Aspiration Risk  Moderate    Diet Recommendation Dysphagia 3 (Mech soft);Nectar   Medication Administration: Whole meds with puree Compensations: Slow rate;Small sips/bites    Other  Recommendations Oral Care Recommendations: Oral care BID   Follow Up Recommendations       Frequency and Duration        Pertinent Vitals/Pain none    SLP Swallow Goals     Swallow Study Prior Functional Status       General Other Pertinent Information: 81 y.o. female with a PMH of chronic respiratory failure, COPD, GERD, pna, DM, gastric ulcer with hemorrhage. Per MD on 11/21/14 she was seen by her PCP with wheezing and cough, patient's symptoms have progressed.Found to have acute on chronic resp failure secondary to bronchiectasis with acute exacerbation, mucopurulent chronic bronchitis and possible HCAP. CXR 10/21 Improving interstitial prominence with mild persistent interstitial prominence, likely chronic interstitial disease. Pt denied coughing with food/liquid. Comment on order states RN noted pt coughing with food in take.  Type of Study: Bedside swallow evaluation Previous Swallow Assessment:  (none) Diet Prior to this Study: Regular;Thin liquids Temperature Spikes Noted: No Respiratory Status: Supplemental O2 delivered via (comment) History of Recent Intubation: No Behavior/Cognition: Alert;Cooperative;Pleasant mood;Requires cueing Oral Cavity - Dentition: Adequate natural dentition/normal for age Self-Feeding Abilities: Able to feed self Patient Positioning: Upright in bed Baseline Vocal Quality: Normal Volitional Cough: Strong Volitional Swallow: Able to elicit    Oral/Motor/Sensory Function Overall Oral Motor/Sensory Function: Appears within functional limits for tasks assessed   Ice Chips Ice chips: Not tested   Thin Liquid Thin Liquid: Impaired Presentation: Cup;Straw Oral Phase Impairments:  (none) Oral Phase Functional Implications:  (none) Pharyngeal  Phase Impairments:   (incr work of breathing, watery eyes/nose, decr coor respirat)    Nectar Thick Nectar Thick Liquid: Not tested   Honey Thick Honey Thick Liquid: Not tested   Puree Puree: Impaired Presentation: Spoon Oral Phase Impairments:  (none) Oral Phase Functional Implications:  (none) Pharyngeal Phase Impairments:  (increase work of breathing, ? of inhale after swallow)   Solid   GO    Solid: Within functional limits       Houston Siren 11/27/2014,9:25 AM  Orbie Pyo Colvin Caroli.Ed [employer] [phone]

## 2014-11-27 NOTE — Care Management Important Message (Signed)
Important Message  Patient Details  Name: NOREEN MACKINTOSH MRN: [id number] Date of Birth: 02-23-1917   Medicare Important Message Given:  Yes-second notification given    Camillo Flaming 11/27/2014, 12:41 [place] Message  Patient Details  Name: AMSI GRIMLEY MRN: [id number] Date of Birth: 29-Sep-1917   Medicare Important Message Given:  Yes-second notification given    Camillo Flaming 11/27/2014, 12:40 PM

## 2014-11-28 ENCOUNTER — Encounter: Payer: Self-pay | Admitting: Internal Medicine

## 2014-11-28 ENCOUNTER — Inpatient Hospital Stay (HOSPITAL_COMMUNITY): Payer: Medicare Other

## 2014-11-28 DIAGNOSIS — I10 Essential (primary) hypertension: Secondary | ICD-10-CM

## 2014-11-28 DIAGNOSIS — J9622 Acute and chronic respiratory failure with hypercapnia: Secondary | ICD-10-CM

## 2014-11-28 DIAGNOSIS — J9621 Acute and chronic respiratory failure with hypoxia: Secondary | ICD-10-CM

## 2014-11-28 DIAGNOSIS — N3000 Acute cystitis without hematuria: Secondary | ICD-10-CM

## 2014-11-28 DIAGNOSIS — J189 Pneumonia, unspecified organism: Principal | ICD-10-CM

## 2014-11-28 DIAGNOSIS — E871 Hypo-osmolality and hyponatremia: Secondary | ICD-10-CM

## 2014-11-28 DIAGNOSIS — E1151 Type 2 diabetes mellitus with diabetic peripheral angiopathy without gangrene: Secondary | ICD-10-CM

## 2014-11-28 LAB — CBC
HCT: 37.7 % (ref 36.0–46.0)
HEMOGLOBIN: 12.6 g/dL (ref 12.0–15.0)
MCH: 30.4 pg (ref 26.0–34.0)
MCHC: 33.4 g/dL (ref 30.0–36.0)
MCV: 91.1 fL (ref 78.0–100.0)
Platelets: 251 10*3/uL (ref 150–400)
RBC: 4.14 MIL/uL (ref 3.87–5.11)
RDW: 14.1 % (ref 11.5–15.5)
WBC: 17.4 10*3/uL — ABNORMAL HIGH (ref 4.0–10.5)

## 2014-11-28 LAB — BASIC METABOLIC PANEL
ANION GAP: 8 (ref 5–15)
BUN: 35 mg/dL — AB (ref 6–20)
CALCIUM: 8.6 mg/dL — AB (ref 8.9–10.3)
CO2: 30 mmol/L (ref 22–32)
Chloride: 90 mmol/L — ABNORMAL LOW (ref 101–111)
Creatinine, Ser: 0.87 mg/dL (ref 0.44–1.00)
GFR calc Af Amer: >60 mL/min (ref >60)
GFR, EST NON AFRICAN AMERICAN: 55 mL/min — AB (ref >60)
GLUCOSE: 277 mg/dL — AB (ref 65–99)
Potassium: 5 mmol/L (ref 3.5–5.1)
SODIUM: 128 mmol/L — AB (ref 135–145)

## 2014-11-28 LAB — GLUCOSE, CAPILLARY
GLUCOSE-CAPILLARY: 320 mg/dL — AB (ref 65–99)
Glucose-Capillary: 227 mg/dL — ABNORMAL HIGH (ref 65–99)
Glucose-Capillary: 183 mg/dL — ABNORMAL HIGH (ref 65–99)
Glucose-Capillary: 148 mg/dL — ABNORMAL HIGH (ref 65–99)

## 2014-11-28 MED ORDER — LEVOFLOXACIN IN D5W 750 MG/150ML IV SOLN
750.0000 mg | INTRAVENOUS | Status: DC
  Administered 2014-11-28: 750 mg via INTRAVENOUS
  Filled 2014-11-28: qty 150

## 2014-11-28 MED ORDER — INSULIN GLARGINE 100 UNIT/ML ~~LOC~~ SOLN
12.0000 [IU] | Freq: Every day | SUBCUTANEOUS | Status: DC
  Administered 2014-11-28: 12 [IU] via SUBCUTANEOUS
  Filled 2014-11-28 (×2): qty 0.12

## 2014-11-28 NOTE — Progress Notes (Signed)
MBS completed, full report to follow.  Pt without aspiration of any consistency tested *barium tablet, cracker, pudding, nectar & thin.  Swallow was timely without severe residue.  Mild amount of oropharyngeal residuals WITHOUT pt sensation due to mild weakness. Cued dry swallows helpful to decrease residuals.    Barium tablet swallowed with thin liquid appeared to pause at mid-esophagus with referent sensation to left side of pharynx.  Further intake of liquids facilitated clearance.  Pt did cough prior to and during MBS without barium visualized in larynx/trachea.   Mildly prominent cricopharyngeus noted.  Appearance of slow clearance at distal esophagus with esophagus appearing wide and patent.      Frequent belching noted throughout MBS without barium backflowing into pharynx.  Overt coughing with face turning red noted after pt returned to bed and HOB lowered that may indicate reflux issues.  Suspect primary aspiration concern is from reflux - pt does admit to consuming Tums daily at home.  Advised her to speak to MD regarding this matter.   Luanna Salk, [place] [facility] SLP [phone]

## 2014-11-28 NOTE — Progress Notes (Signed)
Physical Therapy Treatment Patient Details Name: [name] MRN: [id number] DOB: Jun 08, 1917 Today's Date: 11/28/2014    History of Present Illness [name] is an 81 y.o. female adm with acute on chronic repiratory failure;  PMHx:  chronic respiratory failure on home oxygen, COPD, DM, HTN    PT Comments    Pt OOB in recliner on 3 lts O2 sats at rest 99%.  Removed O2 resting sats avg 92%.  RA decreased to 87% with amb.   SATURATION QUALIFICATIONS: (This note is used to comply with regulatory documentation for home oxygen)  Patient Saturations on Room Air at Rest = 92%  Patient Saturations on Room Air while Ambulating = 87%  Patient Saturations on 3 Liters of oxygen while Ambulating = 91%  Please briefly explain why patient needs home oxygen: requires supplemental oxygen to achieve therapeutic level   Follow Up Recommendations  SNF;Home health PT (pending if ALF can provide current level of care)     Equipment Recommendations       Recommendations for Other Services       Precautions / Restrictions Precautions Precautions: Fall Precaution Comments: monitor vitals Restrictions Weight Bearing Restrictions: No    Mobility  Bed Mobility               General bed mobility comments: Pt OOB in recliner  Transfers Overall transfer level: Needs assistance Equipment used: Rolling walker (2 wheeled);None Transfers: Sit to/from Stand Sit to Stand: Min assist         General transfer comment: incr time, 75% cues for technique and safety--hand placement and not sitting too soon  Ambulation/Gait Ambulation/Gait assistance: Min assist Ambulation Distance (Feet): 12 Feet Assistive device: Rolling walker (2 wheeled) Gait Pattern/deviations: Step-to pattern;Step-through pattern;Trunk flexed Gait velocity: amb on RA sats decreased to 87%.  Unsteady gait.  Limited activity tolerance.   General Gait Details: incr time, cues for RW distance from self, posture  and breathing   Stairs            Wheelchair Mobility    Modified Rankin (Stroke Patients Only)       Balance                                    Cognition Arousal/Alertness: Awake/alert Behavior During Therapy: WFL for tasks assessed/performed Overall Cognitive Status: Within Functional Limits for tasks assessed                      Exercises      General Comments        Pertinent Vitals/Pain Pain Assessment: No/denies pain    Home Living                      Prior Function            PT Goals (current goals can now be found in the care plan section) Progress towards PT goals: Progressing toward goals    Frequency  Min 3X/week    PT Plan      Co-evaluation             End of Session Equipment Utilized During Treatment: Gait belt Activity Tolerance: Patient limited by fatigue Patient left: in chair;with call bell/phone within reach;with chair alarm set     Time: 1020-1050 PT Time Calculation (min) (ACUTE ONLY): 30 min  Charges:  $Gait Training: 8-22 mins $Therapeutic Activity:  8-22 mins                    G Codes:      Rica Koyanagi  PTA [facility]  Acute  Rehab Pager      [phone]

## 2014-11-28 NOTE — Progress Notes (Addendum)
Patient ID: [name], female   DOB: [birth year], 81 y.o.   MRN: [id number]  [facility] PROGRESS NOTE  [name]:[id number] DOB: [birth year] DOA: 11/24/2014 PCP: Estill Dooms, MD   Brief narrative:    81 y.o. female with a PMH of chronic respiratory failure on home oxygen, COPD under the care of Dr. Lake Bells, last seen in the office on 08/11/14 where he had recommended inhaled therapy but patient declined. She was given a pneumonia vaccine at that time. On 11/21/14, she was seen by her PCP with wheezing and cough. She was placed on Duonebs and prednisone at that time. She has required increased use of her home oxygen and despite treatment with prednisone, Avelox and bronchodilators, patient's symptoms have progressed and she was admitted 11/24/14 with acute on chronic respiratory failure.   Assessment/Plan:    Principal Problem:  Acute on chronic respiratory failure (HCC) secondary to bronchiectasis with acute exacerbation, mucopurulent chronic bronchitis and possible HCAP - was on solumedrol, plan to transition to oral Prednisone with continue tapering  - Continue scheduled bronchodilators, antitussives and empiric vancomycin/Zosyn. - transition to oral ABX once MBS study done to determine appropriate diet  - Follow-up Blood cultures, sputum cultures. So far all negative to date   Active Problems:  Hyperkalemia - WNL this AM    R/O Dysphagia - Seen by Speech therapist, on dysphagia diet with nectar thickened liquids. - Barium swallow ordered for today, will follow up on results    Type 2 diabetes with decreased circulation ([place]) / steroid-induced hyperglycemia - Most recent hemoglobin A1c stable at 7.6%. - increased Lantus to 12 U QHS - continue SSI - appreciate diabetic educator assistance    Urine, incontinence, stress female/UTI - Follow-up urine culture. On broad-spectrum antibiotics as above - narrow ABX down as soon as MBS done     Hyponatremia - May be secondary to dehydration versus diuretics. Monitor.  - sodium remains ~ 128  - mental status stable this AM    GERD  - Patient has a history of bleeding ulcers. Continue PPI therapy. Mylanta ordered as needed.   Hypertension - Continue Cardura, Coreg, and losartan - continue to hold Lasix and spironolactone    Obesity - Body mass index is 32.19 kg/(m^2).   DVT prophylaxis - Lovenox SQ   Code Status: DNR Family Communication: No family currently at the bedside. Updated pt  Disposition Plan: From ALF ([facility]). Anticipate D/C in AM  IV access:  Peripheral IV  Procedures and diagnostic studies:    Dg Chest 2 View 11/24/2014 Improving interstitial prominence with mild persistent interstitial prominence, likely chronic interstitial disease.   Medical Consultants:  SLP, PT  Other Consultants:  None  IAnti-Infectives:   Vanc and Zosyn 10/21 -->  Faye Ramsay, MD  [facility] Pager [phone]  If 7PM-7AM, please contact night-coverage [email or web address] Password [facility] 11/28/2014, 12:27 PM   LOS: 4 days   HPI/Subjective: No events overnight.   Objective: Filed Vitals:   11/27/14 1955 11/27/14 2100 11/28/14 0520 11/28/14 0940  BP:  128/64 154/76   Pulse:  78 75   Temp:  97.7 F (36.5 C) 97.9 F (36.6 C)   TempSrc:  Oral Oral   Resp:  20 20   Height:      Weight:      SpO2: 95% 98% 100% 95%    Intake/Output Summary (Last 24 hours) at 11/28/14 1227 Last data filed at 11/28/14 0800  Gross per 24 hour  Intake  890 ml  Output      0 ml  Net    890 ml    Exam:   General:  Pt is alert, follows commands appropriately, not in acute distress, HOH  Cardiovascular: Regular rate and rhythm, no rubs, no gallops  Respiratory: Clear to auscultation bilaterally, mild exp wheezing   Abdomen: Soft, non tender, non distended, bowel sounds present, no guarding  Extremities: pulses DP and PT palpable bilaterally   Data  Reviewed: Basic Metabolic Panel:  Recent Labs Lab 11/22/14 11/24/14 1013 11/26/14 0505 11/27/14 0540 11/28/14 0550  NA 121* 125* 127* 128* 128*  K 5.0 5.1 4.8 5.3* 5.0  CL  --  89* 86* 91* 90*  CO2  --  29 31 29 30   GLUCOSE  --  154* 302* 188* 277*  BUN 20 27* 31* 31* 35*  CREATININE 0.7 0.82 0.84 0.76 0.87  CALCIUM  --  9.2 9.1 8.7* 8.6*   Liver Function Tests:  Recent Labs Lab 11/22/14 11/24/14 1013  AST 16 23  ALT 17 24  ALKPHOS 103 102  BILITOT  --  0.5  PROT  --  6.3*  ALBUMIN  --  2.8*   CBC:  Recent Labs Lab 11/22/14 11/24/14 1013 11/26/14 0505 11/27/14 0812 11/28/14 0550  WBC 17.8 17.9* 25.2* 21.1* 17.4*  NEUTROABS  --  14.8*  --   --   --   HGB 11.5* 11.8* 12.0 13.2 12.6  HCT 34* 35.1* 35.8* 38.7 37.7  MCV  --  90.7 90.4 90.4 91.1  PLT 239 263 293 274 251   CBG:  Recent Labs Lab 11/27/14 1155 11/27/14 1715 11/27/14 2140 11/28/14 0728 11/28/14 1145  GLUCAP 311* 197* 239* 227* 320*    Recent Results (from the past 240 hour(s))  Blood culture (routine x 2)     Status: None (Preliminary result)   Collection Time: 11/24/14  2:23 PM  Result Value Ref Range Status   Specimen Description BLOOD RIGHT FOREARM  Final   Special Requests BOTTLES DRAWN AEROBIC AND ANAEROBIC 5 CC EA  Final   Culture   Final    NO GROWTH 3 DAYS Performed at [facility]    Report Status PENDING  Incomplete  Blood culture (routine x 2)     Status: None (Preliminary result)   Collection Time: 11/24/14  3:00 PM  Result Value Ref Range Status   Specimen Description BLOOD LEFT HAND  Final   Special Requests BOTTLES DRAWN AEROBIC AND ANAEROBIC 5 CC EA  Final   Culture   Final    NO GROWTH 3 DAYS Performed at [facility]    Report Status PENDING  Incomplete  Urine culture     Status: None   Collection Time: 11/24/14  3:43 PM  Result Value Ref Range Status   Specimen Description URINE, CLEAN CATCH  Final   Special Requests NONE  Final   Culture    Final    MULTIPLE SPECIES PRESENT, SUGGEST RECOLLECTION Performed at [facility]    Report Status 11/26/2014 FINAL  Final  Culture, sputum-assessment     Status: None   Collection Time: 11/25/14  5:01 AM  Result Value Ref Range Status   Specimen Description SPUTUM  Final   Special Requests Normal  Final   Sputum evaluation   Final    MICROSCOPIC FINDINGS SUGGEST THAT THIS SPECIMEN IS NOT REPRESENTATIVE OF LOWER RESPIRATORY SECRETIONS. PLEASE RECOLLECT. CALLED TO R RAMANDO @ 9937 ON 11/25/14 BY C  DAVIS    Report Status 11/25/2014 FINAL  Final     Scheduled Meds: . atorvastatin  10 mg Oral q1800  . carvedilol  6.25 mg Oral BID WC  . doxazosin  8 mg Oral Daily  . enoxaparin (LOVENOX) injection  40 mg Subcutaneous Q24H  . guaiFENesin  600 mg Oral BID  . insulin aspart  0-20 Units Subcutaneous TID WC  . insulin aspart  0-5 Units Subcutaneous QHS  . insulin aspart  8 Units Subcutaneous TID WC  . insulin glargine  12 Units Subcutaneous QHS  . ipratropium-albuterol  3 mL Nebulization TID  . losartan  50 mg Oral Daily  . omeprazole  20 mg Oral Daily  . piperacillin-tazobactam (ZOSYN)  IV  3.375 g Intravenous Q8H  . predniSONE  60 mg Oral Q breakfast  . saccharomyces boulardii  250 mg Oral BID  . sodium chloride  3 mL Intravenous Q12H  . vancomycin  750 mg Intravenous Q12H   Continuous Infusions:

## 2014-11-28 NOTE — Progress Notes (Signed)
Inpatient Diabetes Program Recommendations  AACE/ADA: New Consensus Statement on Inpatient Glycemic Control (2015)  Target Ranges:  Prepandial:   less than 140 mg/dL      Peak postprandial:   less than 180 mg/dL (1-2 hours)      Critically ill patients:  140 - 180 mg/dL   Review of Glycemic Control  Results for MARGARIT, MINSHALL (MRN [id number]) as of 11/28/2014 10:07  Ref. Range 11/27/2014 07:33 11/27/2014 11:55 11/27/2014 17:15 11/27/2014 21:40 11/28/2014 07:28  Glucose-Capillary Latest Ref Range: 65-99 mg/dL 201 (H) 311 (H) 197 (H) 239 (H) 227 (H)     Inpatient Diabetes Program Recommendations:  Insulin - Basal: Increase Lantus to 12 units QHS   Will continue to follow. Thank you. Lorenda Peck, RD, LDN, CDE Inpatient Diabetes Coordinator [phone]

## 2014-11-28 NOTE — Progress Notes (Signed)
ANTIBIOTIC CONSULT NOTE  Pharmacy Consult for levofloxacin Indication: HCAP  Allergies  Allergen Reactions  . Adhesive [Tape]     unknown  . Aspirin     unknown  . Codeine     unknown  . Enalapril Cough  . Hydrocodone     unknown  . Pantoprazole Sodium     unknown    Patient Measurements: Height: 5\' 4"  (162.6 cm) Weight: 187 lb 9.6 oz (85.095 kg) IBW/kg (Calculated) : 54.7  Vital Signs: Temp: 97.9 F (36.6 C) (10/25 0520) Temp Source: Oral (10/25 0520) BP: 154/76 mmHg (10/25 0520) Pulse Rate: 75 (10/25 0520) Intake/Output from previous day: 10/24 0701 - 10/25 0700 In: 1010 [P.O.:360; IV Piggyback:650] Out: -  Intake/Output from this shift: Total I/O In: 240 [P.O.:240] Out: -   Labs:  Recent Labs  11/26/14 0505 11/27/14 0540 11/27/14 0812 11/28/14 0550  WBC 25.2*  --  21.1* 17.4*  HGB 12.0  --  13.2 12.6  PLT 293  --  274 251  CREATININE 0.84 0.76  --  0.87   Estimated Creatinine Clearance: 40.9 mL/min (by C-G formula based on Cr of 0.87).  Recent Labs  11/27/14 0540  [facility]     Microbiology: Recent Results (from the past 720 hour(s))  Blood culture (routine x 2)     Status: None (Preliminary result)   Collection Time: 11/24/14  2:23 PM  Result Value Ref Range Status   Specimen Description BLOOD RIGHT FOREARM  Final   Special Requests BOTTLES DRAWN AEROBIC AND ANAEROBIC 5 CC EA  Final   Culture   Final    NO GROWTH 3 DAYS Performed at [facility]    Report Status PENDING  Incomplete  Blood culture (routine x 2)     Status: None (Preliminary result)   Collection Time: 11/24/14  3:00 PM  Result Value Ref Range Status   Specimen Description BLOOD LEFT HAND  Final   Special Requests BOTTLES DRAWN AEROBIC AND ANAEROBIC 5 CC EA  Final   Culture   Final    NO GROWTH 3 DAYS Performed at [facility]    Report Status PENDING  Incomplete  Urine culture     Status: None   Collection Time: 11/24/14  3:43 PM  Result Value  Ref Range Status   Specimen Description URINE, CLEAN CATCH  Final   Special Requests NONE  Final   Culture   Final    MULTIPLE SPECIES PRESENT, SUGGEST RECOLLECTION Performed at [facility]    Report Status 11/26/2014 FINAL  Final  Culture, sputum-assessment     Status: None   Collection Time: 11/25/14  5:01 AM  Result Value Ref Range Status   Specimen Description SPUTUM  Final   Special Requests Normal  Final   Sputum evaluation   Final    MICROSCOPIC FINDINGS SUGGEST THAT THIS SPECIMEN IS NOT REPRESENTATIVE OF LOWER RESPIRATORY SECRETIONS. PLEASE RECOLLECT. CALLED TO R RAMANDO @ 1610 ON 11/25/14 BY C DAVIS    Report Status 11/25/2014 FINAL  Final    Assessment: 81 yo F with PMH of chronic respiratory failure on home O2, COPD who was placed on duonebs, prednisone, and Avelox for wheezing and cough. However, her symptoms progressed, and patient was admitted to [facility] on 10/21. UA on admission cloudy with many bacteria, few leukocytes. Pharmacy consulted to assist with dosing of Vancomycin and Zosyn for possible PNA and UTI.   On 10/25, pharmacy is asked to dose levofloxacin after stopping Zosyn  and vancomycin.  10/17 >> Avelox>>10/21 (at [facility]) 10/21 >> Vanc >> 10/25 10/21 >> Zosyn >> 10/25  10/21 blood x 2: NGTD 10/21 urine: multiple species present, suggest recollection 10/22 sputum: not representative of lower respiratory secretions  Goal of Therapy:  Appropriate antibiotic dosing for renal function and indication Eradication of infection  Plan:   Levofloxacin 750 mg IV q48h.   Royetta Asal, PharmD, BCPS Pager: [phone] 11/27/2014 08:04

## 2014-11-29 DIAGNOSIS — J9620 Acute and chronic respiratory failure, unspecified whether with hypoxia or hypercapnia: Secondary | ICD-10-CM

## 2014-11-29 DIAGNOSIS — J471 Bronchiectasis with (acute) exacerbation: Secondary | ICD-10-CM

## 2014-11-29 DIAGNOSIS — K219 Gastro-esophageal reflux disease without esophagitis: Secondary | ICD-10-CM

## 2014-11-29 LAB — CULTURE, BLOOD (ROUTINE X 2)
Culture: NO GROWTH
Culture: NO GROWTH

## 2014-11-29 LAB — CBC
HEMATOCRIT: 39.2 % (ref 36.0–46.0)
HEMOGLOBIN: 13 g/dL (ref 12.0–15.0)
MCH: 30.2 pg (ref 26.0–34.0)
MCHC: 33.2 g/dL (ref 30.0–36.0)
MCV: 91 fL (ref 78.0–100.0)
Platelets: 245 10*3/uL (ref 150–400)
RBC: 4.31 MIL/uL (ref 3.87–5.11)
RDW: 14 % (ref 11.5–15.5)
WBC: 16.6 10*3/uL — AB (ref 4.0–10.5)

## 2014-11-29 LAB — BASIC METABOLIC PANEL
ANION GAP: 8 (ref 5–15)
BUN: 27 mg/dL — ABNORMAL HIGH (ref 6–20)
CALCIUM: 8.9 mg/dL (ref 8.9–10.3)
CHLORIDE: 92 mmol/L — AB (ref 101–111)
CO2: 29 mmol/L (ref 22–32)
Creatinine, Ser: 0.78 mg/dL (ref 0.44–1.00)
GFR calc non Af Amer: >60 mL/min (ref >60)
Glucose, Bld: 216 mg/dL — ABNORMAL HIGH (ref 65–99)
POTASSIUM: 4.8 mmol/L (ref 3.5–5.1)
Sodium: 129 mmol/L — ABNORMAL LOW (ref 135–145)

## 2014-11-29 LAB — GLUCOSE, CAPILLARY
GLUCOSE-CAPILLARY: 254 mg/dL — AB (ref 65–99)
Glucose-Capillary: 198 mg/dL — ABNORMAL HIGH (ref 65–99)
Glucose-Capillary: 168 mg/dL — ABNORMAL HIGH (ref 65–99)

## 2014-11-29 MED ORDER — ALBUTEROL SULFATE (2.5 MG/3ML) 0.083% IN NEBU: 2.5000 mg | INHALATION_SOLUTION | Freq: Three times a day (TID) | RESPIRATORY_TRACT | Status: DC

## 2014-11-29 MED ORDER — LEVOFLOXACIN 500 MG PO TABS: 500.0000 mg | ORAL_TABLET | Freq: Every day | ORAL | Status: DC

## 2014-11-29 MED ORDER — PREDNISONE 10 MG PO TABS: ORAL_TABLET | ORAL | Status: DC

## 2014-11-29 NOTE — Progress Notes (Signed)
CSW introduced self and acknowledge the patient. Patient is alert and oriented. CSW explored options with patient regarding discharge. Patient informed CSW that she would like to go back to [facility], where she has resided for the past 20 years. Patient stated, "I am into the hospital for bronchitis, but I think I can manage living back on my own". Patient provided CSW with permission to get in contact with CSW at [facility], [place]. CSW informed patient that CSW will follow back up once contact has been made with Pamala Hurry.    CSW contacted Narda Rutherford at [facility] to inform of patient's status and possible discharge on today. Per Narda Rutherford, facility will not be able to handle pt's current level of care. Facility is willing to take the patient into their skilled department, and has bed available. CSW to send clinicals. Narda Rutherford will follow up with room number and accepting MD.     Lucius Conn, [place] Worker [place] [phone]

## 2014-11-29 NOTE — Discharge Instructions (Signed)

## 2014-11-29 NOTE — NC FL2 (Signed)
[place] LEVEL OF CARE SCREENING TOOL     IDENTIFICATION  Patient Name: [name] Birthdate: [birth year] Sex: female Admission Date (Current Location): 11/24/2014  [facility] [employer] and [place] Number: Company secretary and Address:  [facility],  [place] [address] [place]      Provider Number: [id number]  Attending Physician Name and Address:  Theodis Blaze, MD  Relative Name and Phone Number:       Current Level of Care: Hospital Recommended Level of Care: [place] Prior Approval Number:    Date Approved/Denied:   PASRR Number: [id number]  Discharge Plan: SNF    Current Diagnoses: Patient Active Problem List   Diagnosis Date Noted  . Hyponatremia 11/24/2014  . Urinary tract infection 11/24/2014  . Bronchiectasis with acute exacerbation ([place]) 11/24/2014  . HCAP (healthcare-associated pneumonia) 11/24/2014  . Acute on chronic respiratory failure ([place]) 11/24/2014  . Infection of urinary tract 06/23/2014  . Acute upper respiratory infection 06/06/2014  . Weakness 05/22/2014  . Insomnia 11/21/2013  . Nodule of neck 11/07/2013  . Palpitations 09/13/2013  . Nocturia 09/13/2013  . Hyperlipidemia 07/19/2013  . Urine, incontinence, stress female 07/19/2013  . Type 2 diabetes with decreased circulation ([place]) 05/24/2013  . Edema 05/24/2013  . Esophageal reflux   . Pain in joint, shoulder region 07/27/2012  . Hypertension   . Arthritis   . Other malaise and fatigue   . Mucopurulent chronic bronchitis ([place])   . Pain in joint, lower leg   . COPD (chronic obstructive pulmonary disease) ([place]) 02/06/2012  . DOE (dyspnea on exertion) 04/10/2011  . Bronchiectasis without acute exacerbation ([place]) 04/10/2011  . Pulmonary nodules 04/10/2011    Orientation ACTIVITIES/SOCIAL BLADDER RESPIRATION  Self (alert and oriented x 4) Active Incontinent O2 (As needed) (3 L)  BEHAVIORAL SYMPTOMS/MOOD NEUROLOGICAL BOWEL NUTRITION  STATUS      Continent Diet (Heart Healthy)  PHYSICIAN VISITS COMMUNICATION OF NEEDS Height & Weight Skin  30 days Verbally 5\' 4"  (162.6 cm) 187 lbs. Normal          AMBULATORY STATUS RESPIRATION    Supervision limited (+1 assist) O2 (As needed) (3 L)      Personal Care Assistance Level of Assistance  Bathing, Dressing Bathing Assistance: Limited assistance   Dressing Assistance: Limited assistance      Functional Limitations Info  Sight, Hearing Sight Info: Impaired Hearing Info: Impaired Speech Info: Adequate       SPECIAL CARE FACTORS FREQUENCY  PT (By licensed PT), Speech therapy     PT Frequency: 5 x week             Additional Factors Info  Code Status, Allergies, Insulin Sliding Scale Code Status Info: DNR Allergies Info: Adhesive, Aspirin, Codeine, Enalapril, Hydrocodone, Pantoprazole Sodium           Current Medications (11/29/2014): Current Facility-Administered Medications  Medication Dose Route Frequency Provider Last Rate Last Dose  . 0.9 %  sodium chloride infusion  250 mL Intravenous PRN Venetia Maxon Rama, MD      . acetaminophen (TYLENOL) tablet 650 mg  650 mg Oral Q6H PRN Venetia Maxon Rama, MD   650 mg at 11/28/14 2110   Or  . acetaminophen (TYLENOL) suppository 650 mg  650 mg Rectal Q6H PRN Christina P Rama, MD      . albuterol (PROVENTIL) (2.5 MG/3ML) 0.083% nebulizer solution 2.5 mg  2.5 mg Nebulization Q2H PRN Venetia Maxon Rama, MD   2.5 mg  at 11/24/14 1649  . alum & mag hydroxide-simeth (MAALOX/MYLANTA) 200-200-20 MG/5ML suspension 30 mL  30 mL Oral Q6H PRN Christina P Rama, MD      . atorvastatin (LIPITOR) tablet 10 mg  10 mg Oral q1800 Venetia Maxon Rama, MD   10 mg at 11/28/14 1752  . carvedilol (COREG) tablet 6.25 mg  6.25 mg Oral BID WC Venetia Maxon Rama, MD   6.25 mg at 11/29/14 0824  . doxazosin (CARDURA) tablet 8 mg  8 mg Oral Daily Venetia Maxon Rama, MD   8 mg at 11/28/14 1021  . enoxaparin (LOVENOX) injection 40 mg  40 mg Subcutaneous  Q24H Venetia Maxon Rama, MD   40 mg at 11/28/14 2110  . guaiFENesin (MUCINEX) 12 hr tablet 600 mg  600 mg Oral BID Venetia Maxon Rama, MD   600 mg at 11/28/14 2110  . guaiFENesin-dextromethorphan (ROBITUSSIN DM) 100-10 MG/5ML syrup 5 mL  5 mL Oral Q4H PRN Christina P Rama, MD      . insulin aspart (novoLOG) injection 0-20 Units  0-20 Units Subcutaneous TID WC Venetia Maxon Rama, MD   4 Units at 11/29/14 [phone]  . insulin aspart (novoLOG) injection 0-5 Units  0-5 Units Subcutaneous QHS Venetia Maxon Rama, MD   2 Units at 11/27/14 2209  . insulin aspart (novoLOG) injection 8 Units  8 Units Subcutaneous TID WC Venetia Maxon Rama, MD   8 Units at 11/29/14 [phone]  . insulin glargine (LANTUS) injection 12 Units  12 Units Subcutaneous QHS Theodis Blaze, MD   12 Units at 11/28/14 2111  . ipratropium-albuterol (DUONEB) 0.5-2.5 (3) MG/3ML nebulizer solution 3 mL  3 mL Nebulization TID Venetia Maxon Rama, MD   3 mL at 11/29/14 1031  . levofloxacin (LEVAQUIN) IVPB 750 mg  750 mg Intravenous Q48H Theodis Blaze, MD   750 mg at 11/28/14 1500  . losartan (COZAAR) tablet 50 mg  50 mg Oral Daily Venetia Maxon Rama, MD   50 mg at 11/28/14 1021  . omeprazole (PRILOSEC) capsule 20 mg  20 mg Oral Daily Venetia Maxon Rama, MD   20 mg at 11/28/14 1021  . ondansetron (ZOFRAN) tablet 4 mg  4 mg Oral Q6H PRN Christina P Rama, MD       Or  . ondansetron (ZOFRAN) injection 4 mg  4 mg Intravenous Q6H PRN Christina P Rama, MD      . predniSONE (DELTASONE) tablet 60 mg  60 mg Oral Q breakfast Venetia Maxon Rama, MD   60 mg at 11/29/14 0809  . saccharomyces boulardii (FLORASTOR) capsule 250 mg  250 mg Oral BID Venetia Maxon Rama, MD   250 mg at 11/28/14 2110  . sodium chloride 0.9 % injection 3 mL  3 mL Intravenous Q12H Venetia Maxon Rama, MD   3 mL at 11/29/14 1059  . sodium chloride 0.9 % injection 3 mL  3 mL Intravenous PRN Venetia Maxon Rama, MD   3 mL at 11/24/14 2140   Do not use this list as official medication orders. Please verify with discharge  summary.  Discharge Medications:   Medication List    STOP taking these medications        metoprolol tartrate 25 MG tablet  Commonly known as:  LOPRESSOR     moxifloxacin 400 MG tablet  Commonly known as:  AVELOX     spironolactone 25 MG tablet  Commonly known as:  ALDACTONE      TAKE these medications  acetaminophen 500 MG tablet  Commonly known as:  TYLENOL  Take 500 mg by mouth 2 (two) times daily.     acetaminophen 325 MG tablet  Commonly known as:  TYLENOL  Take 650 mg by mouth every 6 (six) hours as needed for moderate pain or headache.     albuterol (2.5 MG/3ML) 0.083% nebulizer solution  Commonly known as:  PROVENTIL  Take 3 mLs (2.5 mg total) by nebulization 3 (three) times daily.     atorvastatin 10 MG tablet  Commonly known as:  LIPITOR  Take 10 mg by mouth daily.     CALCIUM ANTACID PO  Take 600 mg by mouth 3 (three) times daily as needed (heartburn).     carvedilol 6.25 MG tablet  Commonly known as:  COREG  Take 6.25 mg by mouth 2 (two) times daily with a meal.     cloNIDine 0.1 MG tablet  Commonly known as:  CATAPRES  Take 0.1 mg by mouth. Take one tablet as needed for bp > 160/100     doxazosin 8 MG tablet  Commonly known as:  CARDURA  Take 8 mg by mouth daily. To control BP.     furosemide 20 MG tablet  Commonly known as:  LASIX  Take 20 mg by mouth daily.     glucose blood test strip  Use three time daily to test blood sugar. Contour Test Strips. Dx:E11.9     guaiFENesin 100 MG/5ML liquid  Commonly known as:  ROBITUSSIN  Take 200 mg by mouth. Take 10 ml every 6 hours as needed for cough     HUMALOG 100 UNIT/ML cartridge  Generic drug:  insulin lispro  Inject 5-8 Units into the skin 3 (three) times daily before meals. Scheduled 5 units TID if blood sugar level is about 150 give an additional 3 units= 8units     insulin glargine 100 UNIT/ML injection  Commonly known as:  LANTUS  Inject 10 Units into the skin at bedtime.      levofloxacin 500 MG tablet  Commonly known as:  LEVAQUIN  Take 1 tablet (500 mg total) by mouth daily.     losartan 50 MG tablet  Commonly known as:  COZAAR  Take 50 mg by mouth daily.     mirabegron ER 50 MG Tb24 tablet  Commonly known as:  MYRBETRIQ  One daily to assist and bladder control     omeprazole 20 MG capsule  Commonly known as:  PRILOSEC  Take 20 mg by mouth daily.     predniSONE 10 MG tablet  Commonly known as:  DELTASONE  Take 60 mg tablet and taper down by 10 mg daily until completed     promethazine 25 MG tablet  Commonly known as:  PHENERGAN  Take 25 mg by mouth every 4 (four) hours as needed for nausea or vomiting.     saccharomyces boulardii 250 MG capsule  Commonly known as:  FLORASTOR  Take 250 mg by mouth 2 (two) times daily.        Relevant Imaging Results:  Relevant Lab Results:  Recent Labs    Additional Information    KIDD, SUZANNA A, LCSW

## 2014-11-29 NOTE — Clinical Social Work Placement (Signed)
   CLINICAL SOCIAL WORK PLACEMENT  NOTE  Date:  11/29/2014  Patient Details  Name: [name] MRN: [id number] Date of Birth: Jul 04, 1917  Clinical Social Work is seeking post-discharge placement for this patient at the [place] level of care (*CSW will initial, date and re-position this form in  chart as items are completed):  No   Patient/family provided with [place] Work Department's list of facilities offering this level of care within the geographic area requested by the patient (or if unable, by the patient's family).  Yes   Patient/family informed of their freedom to choose among providers that offer the needed level of care, that participate in Medicare, Medicaid or managed care program needed by the patient, have an available bed and are willing to accept the patient.      Patient/family informed of [facility]'s ownership interest in [facility] and [facility], as well as of the fact that they are under no obligation to receive care at these facilities.  PASRR submitted to EDS on       PASRR number received on       Existing PASRR number confirmed on 11/29/14     FL2 transmitted to all facilities in geographic area requested by pt/family on       FL2 transmitted to all facilities within larger geographic area on       Patient informed that his/her managed care company has contracts with or will negotiate with certain facilities, including the following:        Yes   Patient/family informed of bed offers received.  Patient chooses bed at  [facility] )     Physician recommends and patient chooses bed at      Patient to be transferred to  [facility]) on 11/29/14.  Patient to be transferred to facility by  [facility])     Patient family notified on 11/29/14 of transfer.  Name of family member notified:  Daughter [name]     PHYSICIAN       Additional Comment:     _______________________________________________ Raymondo Band, LCSW 11/29/2014, 2:50 PM

## 2014-11-29 NOTE — Progress Notes (Signed)
CSW received phone call from [facility] stating that pt approved for SNF at [facility]. [employer] Medicare authorization number is [id number]. CSW contacted [facility] and left auth information in voice message to admissions coordinator. Admissions coordinator had requested ambulance transport to be arranged and transport was arranged at 4 pm.   No further social work needs identified at this time.  CSW signing off.   Alison Murray, MSW, LCSW Clinical Social Work Coverage [phone]

## 2014-11-29 NOTE — Care Management Note (Signed)
Case Management Note  Patient Details  Name: JULIYA MAGILL MRN: [id number] Date of Birth: [birth year]  Subjective/Objective:                    Action/Plan:d/c SNF.   Expected Discharge Date:   (unknown)               Expected Discharge Plan:  Skilled Nursing Facility  In-House Referral:  Clinical Social Work  Discharge planning Services  CM Consult  Post Acute Care Choice:    Choice offered to:     DME Arranged:    DME Agency:     HH Arranged:    [place] Agency:     Status of Service:  Completed, signed off  Medicare Important Message Given:  Yes-second notification given Date Medicare IM Given:    Medicare IM give by:    Date Additional Medicare IM Given:    Additional Medicare Important Message give by:     If discussed at [place] of Stay Meetings, dates discussed:    Additional Comments:  Dessa Phi, RN 11/29/2014, 10:51 AM

## 2014-11-29 NOTE — Progress Notes (Signed)
CSW was informed by facility representative that transport can be arranged for patient. Facility is working on receiving [employer]. Patient to transported to facility via [facility]. Nurse has been provided number to call report. Patient has been accepted into room 33. Patient and family informed of transport. No further CSW needs requested at this time. CSW to sign off.   [name], [place] Social Worker [place] [phone]

## 2014-11-29 NOTE — Progress Notes (Signed)
Speech Language Pathology Treatment: Dysphagia  Patient Details Name: [name] MRN: [id number] DOB: Oct 06, 1917 Today's Date: 11/29/2014 Time: 1205-1225 SLP Time Calculation (min) (ACUTE ONLY): 20 min  Assessment / Plan / Recommendation Clinical Impression  Pt sitting up in room with frequent belching = she reports she is lactaid intolerant and has been receiving milk which increases her gas production.    Nurse tech reports pt with significant coughing last night during dinner but improved tolerance of po today.  Pt admits to coughing on secretions, reflux and food at times. She can not differentiate when coughing/choking occurs - beginning, middle or end of meal.  Nor does report coughing is worse AFTER meals and pt admits to difficulties with lettuce = causing SLP to suspect primary esophageal deficits.    Provided pt with written information re: esophageal dysphagia and swallow deficits with COPD.    Pt again reiterated her use of Tums nearly every day in addition to her daily omeprazole.  Recommend continue addressing and monitoring pt's swallow function, including reflux issues.  If indicated in the future, pt may benefit from esophageal work up.  SLP to sign off as pt to dc today to [facility].              HPI Other Pertinent Information: 81 y.o. female with a PMH of chronic respiratory failure, COPD, GERD, pna, DM, gastric ulcer with hemorrhage. Per MD on 11/21/14 she was seen by her PCP with wheezing and cough, patient's symptoms have progressed.Found to have acute on chronic resp failure secondary to bronchiectasis with acute exacerbation, mucopurulent chronic bronchitis and possible HCAP. CXR 10/21 Improving interstitial prominence with mild persistent interstitial prominence, likely chronic interstitial disease. Pt denied coughing with food/liquid. Comment on order states RN noted pt coughing with food in take.    Pertinent Vitals Pain Assessment: No/denies pain  SLP  Plan  All goals met    Recommendations Diet recommendations: Dysphagia 3 (mechanical soft);Thin liquid Liquids provided via: Cup;Straw Medication Administration: Whole meds with liquid (or with pudding if problematic) Supervision: Patient able to self feed Compensations: Slow rate;Small sips/bites;Follow solids with liquid (intermittent dry swallow) Postural Changes and/or Swallow Maneuvers: Seated upright 90 degrees;Upright 30-60 min after meal              Plan: All goals met    [place], [facility] SLP [phone]

## 2014-11-29 NOTE — Discharge Summary (Signed)
Physician Discharge Summary  [name] BWG:[id number] DOB: March 09, 1917 DOA: 11/24/2014  PCP: Carla Dooms, MD  Admit date: 11/24/2014 Discharge date: 11/29/2014  Recommendations for Outpatient Follow-up:  1. Pt will need to follow up with PCP in 2-3 weeks post discharge 2. Please obtain BMP to evaluate electrolytes and kidney function 3. Please also check CBC to evaluate Hg and Hct levels 4. Please note that spironolactone was stopped due to hyperkalemia  5. Continue Levaquin for 5 more days post discharge 6. Continue with prednisone taper  7. 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 female with a PMH of chronic respiratory failure on home oxygen, COPD under the care of [name], last seen in the office on 08/11/14 where he had recommended inhaled therapy but patient declined. She was given a pneumonia vaccine at that time. On 11/21/14, she was seen by her PCP with wheezing and cough. She was placed on Duonebs and prednisone at that time. She has required increased use of her home oxygen and despite treatment with prednisone, Avelox and bronchodilators, patient's symptoms have progressed and she was admitted 11/24/14 with acute on chronic respiratory failure.   Assessment/Plan:    Principal Problem:  Acute on chronic respiratory failure (HCC) secondary to bronchiectasis with acute exacerbation, mucopurulent  chronic bronchitis and possible HCAP - was on solumedrol, plan to transition to oral Prednisone with continue tapering  - Continue scheduled bronchodilators, antitussives and Levaquin upon discharge  - Follow-up Blood cultures, sputum cultures. So far all negative to date   Active Problems:  Hyperkalemia - WNL this AM  - please note that spironolactone was stopped    R/O Dysphagia - Seen by Speech therapist, on dysphagia diet with nectar thickened liquids. 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 - continue Lantus per home medical regimen  - appreciate diabetic educator assistance    Urine, incontinence, stress female/UTI - Follow-up urine culture. Levaquin should be adequate    Hyponatremia - May be secondary to dehydration versus diuretics. Monitor.  - sodium remains ~ 128  - mental status stable this AM    GERD  - Patient has a history of bleeding ulcers. Continue PPI therapy.   Hypertension - Continue Cardura, Coreg, and losartan - please note that spironolactone was stopped due to hyperkalemia    Obesity - Body mass index is 32.19 kg/(m^2).  Code Status: DNR Family Communication: No family currently at the bedside. Updated pt  Disposition Plan: SNF  IV access:  Peripheral IV  Procedures and diagnostic studies:   Dg Chest 2 View 11/24/2014 Improving interstitial prominence with mild persistent interstitial prominence, likely chronic interstitial disease.   Medical Consultants:  SLP, PT  Other Consultants:  None  IAnti-Infectives:   Vanc and Zosyn 10/21 --> transitioned to oral levaquin to complete therapy upon discharge  Discharge Exam: Filed Vitals:   11/29/14 0633  BP: 142/99  Pulse: 80  Temp: 97.9 F (36.6 C)  Resp: 20   Filed Vitals:   11/28/14 1513 11/28/14 2059 11/28/14 2145 11/29/14 0633  BP:    157/42 142/99  Pulse:   77 80  Temp:   98 F (36.7 C) 97.9 F (36.6 C)  TempSrc:   Oral Oral  Resp:   20 20  Height:      Weight:      SpO2: 92% 94% 97% 96%    General: Pt is alert, follows commands appropriately, not in acute distress Cardiovascular: Regular rate and rhythm, no rubs, no gallops Respiratory: Clear to auscultation bilaterally, no wheezing, diminished breath sounds at bases  Abdominal: Soft, non tender, non distended, bowel sounds +, no guarding  Discharge Instructions  Discharge Instructions    Diet - low sodium heart healthy    Complete by:  As directed      Increase activity slowly    Complete by:  As directed             Medication List    STOP taking these medications        metoprolol tartrate 25 MG tablet  Commonly known as:  LOPRESSOR     moxifloxacin 400 MG tablet  Commonly known as:  AVELOX     spironolactone 25 MG tablet  Commonly known as:  ALDACTONE      TAKE these medications        acetaminophen 500 MG tablet  Commonly known as:  TYLENOL  Take 500 mg by mouth 2 (two) times daily.     acetaminophen 325 MG tablet  Commonly known as:  TYLENOL  Take 650 mg by mouth every 6 (six) hours as needed for moderate pain or headache.     albuterol (2.5 MG/3ML) 0.083% nebulizer solution  Commonly known as:  PROVENTIL  Take 3 mLs (2.5 mg total) by nebulization 3 (three) times daily.     atorvastatin 10 MG tablet  Commonly known as:  LIPITOR  Take 10 mg by mouth daily.     CALCIUM ANTACID PO  Take 600 mg by mouth 3 (three) times daily as needed (heartburn).     carvedilol 6.25 MG tablet  Commonly known as:  COREG  Take 6.25 mg by mouth 2 (two) times daily with a meal.     cloNIDine 0.1 MG tablet  Commonly known as:  CATAPRES  Take 0.1 mg by mouth. Take one tablet as needed for bp > 160/100     doxazosin 8 MG tablet  Commonly known as:  CARDURA  Take 8 mg by mouth daily. To control BP.     furosemide 20 MG tablet  Commonly  known as:  LASIX  Take 20 mg by mouth daily.     glucose blood test strip  Use three time daily to test blood sugar. Contour Test Strips. Dx:E11.9     guaiFENesin 100 MG/5ML liquid  Commonly known as:  ROBITUSSIN  Take 200 mg by mouth. Take 10 ml every 6 hours as needed for cough     HUMALOG 100 UNIT/ML cartridge  Generic drug:  insulin lispro  Inject 5-8 Units into the skin 3 (three) times daily before meals. Scheduled 5 units TID if blood sugar level is about 150 give an additional 3 units= 8units     insulin glargine 100 UNIT/ML injection  Commonly known as:  LANTUS  Inject 10 Units into the skin  at bedtime.     levofloxacin 500 MG tablet  Commonly known as:  LEVAQUIN  Take 1 tablet (500 mg total) by mouth daily.     losartan 50 MG tablet  Commonly known as:  COZAAR  Take 50 mg by mouth daily.     mirabegron ER 50 MG Tb24 tablet  Commonly known as:  MYRBETRIQ  One daily to assist and bladder control     omeprazole 20 MG capsule  Commonly known as:  PRILOSEC  Take 20 mg by mouth daily.     predniSONE 10 MG tablet  Commonly known as:  DELTASONE  Take 60 mg tablet and taper down by 10 mg daily until completed     promethazine 25 MG tablet  Commonly known as:  PHENERGAN  Take 25 mg by mouth every 4 (four) hours as needed for nausea or vomiting.     saccharomyces boulardii 250 MG capsule  Commonly known as:  FLORASTOR  Take 250 mg by mouth 2 (two) times daily.           Follow-up Information    Follow up with GREEN, Viviann Spare, MD.   Specialty:  Internal Medicine   Contact information:   [place] [address] [phone]        The results of significant diagnostics from this hospitalization (including imaging, microbiology, ancillary and laboratory) are listed below for reference.     Microbiology: Recent Results (from the past 240 hour(s))  Blood culture (routine x 2)     Status: None (Preliminary result)   Collection Time: 11/24/14   2:23 PM  Result Value Ref Range Status   Specimen Description BLOOD RIGHT FOREARM  Final   Special Requests BOTTLES DRAWN AEROBIC AND ANAEROBIC 5 CC EA  Final   Culture   Final    NO GROWTH 4 DAYS Performed at [facility]    Report Status PENDING  Incomplete  Blood culture (routine x 2)     Status: None (Preliminary result)   Collection Time: 11/24/14  3:00 PM  Result Value Ref Range Status   Specimen Description BLOOD LEFT HAND  Final   Special Requests BOTTLES DRAWN AEROBIC AND ANAEROBIC 5 CC EA  Final   Culture   Final    NO GROWTH 4 DAYS Performed at [facility]    Report Status PENDING  Incomplete  Urine culture     Status: None   Collection Time: 11/24/14  3:43 PM  Result Value Ref Range Status   Specimen Description URINE, CLEAN CATCH  Final   Special Requests NONE  Final   Culture   Final    MULTIPLE SPECIES PRESENT, SUGGEST RECOLLECTION Performed at [facility]    Report Status 11/26/2014 FINAL  Final  Culture, sputum-assessment     Status: None   Collection Time: 11/25/14  5:01 AM  Result Value Ref Range Status   Specimen Description SPUTUM  Final   Special Requests Normal  Final   Sputum evaluation   Final    MICROSCOPIC FINDINGS SUGGEST THAT THIS SPECIMEN IS NOT REPRESENTATIVE OF LOWER RESPIRATORY SECRETIONS. PLEASE RECOLLECT. CALLED TO R RAMANDO @ [phone] ON 11/25/14 BY C DAVIS    Report Status 11/25/2014 FINAL  Final     Labs: Basic Metabolic Panel:  Recent Labs Lab 11/24/14 1013 11/26/14 0505 11/27/14 0540 11/28/14 0550 11/29/14 0615  NA 125* 127* 128* 128* 129*  K 5.1 4.8 5.3* 5.0 4.8  CL 89* 86* 91*  90* 92*  CO2 29 31 29 30 29   GLUCOSE 154* 302* 188* 277* 216*  BUN 27* 31* 31* 35* 27*  CREATININE 0.82 0.84 0.76 0.87 0.78  CALCIUM 9.2 9.1 8.7* 8.6* 8.9   Liver Function Tests:  Recent Labs Lab 11/24/14 1013  AST 23  ALT 24  ALKPHOS 102  BILITOT 0.5  PROT 6.3*  ALBUMIN 2.8*   CBC:  Recent Labs Lab  11/24/14 1013 11/26/14 0505 11/27/14 0812 11/28/14 0550 11/29/14 0615  WBC 17.9* 25.2* 21.1* 17.4* 16.6*  NEUTROABS 14.8*  --   --   --   --   HGB 11.8* 12.0 13.2 12.6 13.0  HCT 35.1* 35.8* 38.7 37.7 39.2  MCV 90.7 90.4 90.4 91.1 91.0  PLT 263 293 274 251 245   BNP (last 3 results)  Recent Labs  11/24/14 1013  BNP 202.7*   CBG:  Recent Labs Lab 11/28/14 0728 11/28/14 1145 11/28/14 1637 11/28/14 2057 11/29/14 0732  GLUCAP 227* 320* 183* 148* 198*     SIGNED: Time coordinating discharge: 30 minutes  MAGICK-Christin Moline, MD  [facility] 11/29/2014, 10:26 AM Pager [phone]  If 7PM-7AM, please contact night-coverage [email or web address] Password [email or web address]

## 2014-11-30 ENCOUNTER — Non-Acute Institutional Stay (SKILLED_NURSING_FACILITY): Payer: Medicare Other | Admitting: Internal Medicine

## 2014-11-30 DIAGNOSIS — J471 Bronchiectasis with (acute) exacerbation: Secondary | ICD-10-CM

## 2014-11-30 DIAGNOSIS — E1151 Type 2 diabetes mellitus with diabetic peripheral angiopathy without gangrene: Secondary | ICD-10-CM

## 2014-11-30 DIAGNOSIS — R609 Edema, unspecified: Secondary | ICD-10-CM | POA: Diagnosis not present

## 2014-11-30 DIAGNOSIS — J410 Simple chronic bronchitis: Secondary | ICD-10-CM | POA: Diagnosis not present

## 2014-11-30 DIAGNOSIS — R000 Tachycardia, unspecified: Secondary | ICD-10-CM

## 2014-11-30 DIAGNOSIS — E871 Hypo-osmolality and hyponatremia: Secondary | ICD-10-CM | POA: Diagnosis not present

## 2014-11-30 DIAGNOSIS — J9620 Acute and chronic respiratory failure, unspecified whether with hypoxia or hypercapnia: Secondary | ICD-10-CM | POA: Diagnosis not present

## 2014-11-30 DIAGNOSIS — I10 Essential (primary) hypertension: Secondary | ICD-10-CM

## 2014-11-30 DIAGNOSIS — R531 Weakness: Secondary | ICD-10-CM | POA: Diagnosis not present

## 2014-11-30 LAB — BASIC METABOLIC PANEL
BUN: 26 mg/dL — AB (ref 4–21)
CREATININE: 0.7 mg/dL (ref 0.5–1.1)
Glucose: 246 mg/dL
Potassium: 4.9 mmol/L (ref 3.4–5.3)
Sodium: 132 mmol/L — AB (ref 137–147)

## 2014-11-30 LAB — CBC AND DIFFERENTIAL
HEMATOCRIT: 35 % — AB (ref 36–46)
Hemoglobin: 12.1 g/dL (ref 12.0–16.0)
Platelets: 201 10*3/uL (ref 150–399)
WBC: 14.5 10^3/mL

## 2014-12-01 ENCOUNTER — Non-Acute Institutional Stay (SKILLED_NURSING_FACILITY): Payer: Medicare Other | Admitting: Nurse Practitioner

## 2014-12-01 ENCOUNTER — Encounter: Payer: Self-pay | Admitting: Nurse Practitioner

## 2014-12-01 DIAGNOSIS — K219 Gastro-esophageal reflux disease without esophagitis: Secondary | ICD-10-CM

## 2014-12-01 DIAGNOSIS — N393 Stress incontinence (female) (male): Secondary | ICD-10-CM

## 2014-12-01 DIAGNOSIS — R609 Edema, unspecified: Secondary | ICD-10-CM | POA: Diagnosis not present

## 2014-12-01 DIAGNOSIS — I10 Essential (primary) hypertension: Secondary | ICD-10-CM

## 2014-12-01 DIAGNOSIS — M25512 Pain in left shoulder: Secondary | ICD-10-CM | POA: Diagnosis not present

## 2014-12-01 DIAGNOSIS — J471 Bronchiectasis with (acute) exacerbation: Secondary | ICD-10-CM

## 2014-12-01 DIAGNOSIS — E1151 Type 2 diabetes mellitus with diabetic peripheral angiopathy without gangrene: Secondary | ICD-10-CM | POA: Diagnosis not present

## 2014-12-01 DIAGNOSIS — E871 Hypo-osmolality and hyponatremia: Secondary | ICD-10-CM | POA: Diagnosis not present

## 2014-12-01 NOTE — Assessment & Plan Note (Signed)
Continue Myrbetriq 50mg  daily, no urinary retention.

## 2014-12-01 NOTE — Assessment & Plan Note (Signed)
-   controlled, continue Cardura 8mg  daily, Coreg 6.25mg  bid, Losartan 50mg  and prn Clonidine 0.1mg  fro Bp>160/100 - On lasix 20mg  daily, minimal BLE edema, stable.  - Continue statin

## 2014-12-01 NOTE — Assessment & Plan Note (Signed)
Minimal edema, continue Furosemide 20mg  daily, Na 132, K 4.9, Bun 26, creat 0.74 11/30/14.

## 2014-12-01 NOTE — Assessment & Plan Note (Signed)
C/o nauseated and indigestion, no diarrhea or constipation, continue Omeprazole, adding Zofran 4mg  tid x 3 days. Observe.

## 2014-12-01 NOTE — Progress Notes (Signed)
Patient ID: [name], female   DOB: 13-Mar-1917, 81 y.o.   MRN: [id number]  Location:  SNF [facility] Provider:  Marlana Latus NP  Code Status:  DNR Goals of care: Advanced Directive information    Chief Complaint  Patient presents with  . Medical Management of Chronic Issues  . Acute Visit    nausea, indegestion, SOB  . Hospitalization Follow-up     HPI: Patient is a 81 y.o. female seen in the SNF at [facility] today for evaluation of nausea, indigestion, Hgb 12.1, Na 132 11/30/14, taking Omeprazole daily, currently taking Levaquin and Prednisone taper dose for exacerbation of COPD. Hospital stay 11/24/14-11/29/14 Bronchiectasis with acute exacerbation ([place]) HCAP (healthcare-associated pneumonia)    Review of Systems  Constitutional: Negative for fever, chills and diaphoresis.       Baseline mobility is motorized scooter  HENT: Positive for hearing loss. Negative for congestion, ear discharge and ear pain.   Eyes: Negative for photophobia and pain.  Respiratory: Positive for cough and shortness of breath. Negative for sputum production and wheezing.   Cardiovascular: Positive for leg swelling. Negative for chest pain and palpitations.       Trace. Right knee pain.   Gastrointestinal: Positive for nausea. Negative for vomiting, abdominal pain, diarrhea, constipation and melena.       Indegestion  Genitourinary: Positive for frequency. Negative for dysuria and urgency.       Recurrent urinary tract infections. Nocturia. Urinary frequency.  Musculoskeletal: Negative for myalgias, back pain and neck pain.       R knee pain  Skin: Negative for rash.       Healing wound of the left hand dorsum near the thumb where a lesion was removed by Dr. Redmond Pulling at the [facility].  Neurological: Positive for weakness. Negative for dizziness, tremors, seizures and headaches.  Endo/Heme/Allergies: Negative for environmental allergies.  Psychiatric/Behavioral: Negative for  suicidal ideas and hallucinations. The patient is not nervous/anxious.     Past Medical History  Diagnosis Date  . Type II or unspecified type diabetes mellitus without mention of complication, not stated as uncontrolled   . Hypertension   . Arthritis   . COPD (chronic obstructive pulmonary disease) ([place])   . Regional enteritis of unspecified site   . Other malaise and fatigue   . Anemia, unspecified   . Other and unspecified hyperlipidemia   . Hypoxemia   . Pneumonia, organism unspecified   . Mucopurulent chronic bronchitis ([place])   . Palpitations   . Nontoxic uninodular goiter   . Abnormality of gait   . Fall from other slipping, tripping, or stumbling   . Cholelithiases   . Closed fracture of unspecified part of ramus of mandible   . Disturbance of skin sensation   . Sciatica   . Tension headache   . Coronary atherosclerosis of unspecified type of vessel, native or graft   . Osteoarthrosis, unspecified whether generalized or localized, unspecified site   . Insomnia, unspecified   . Edema   . Tension headache   . Pain in joint, lower leg 2010    right knee  . Unspecified venous (peripheral) insufficiency   . Esophageal reflux   . Rosacea   . Cataract 1990    Dr. Katy Fitch  . Pain in joint, shoulder region 2013    left  . Gastric ulcer with hemorrhage 2008  . Urine, incontinence, stress female 07/19/2013  . Bronchiectasis without acute exacerbation ([place]) 04/10/2011    CT  chest 2011   . DOE (dyspnea on exertion) 04/10/2011    PFTs 2013:  FEV1 1.04 (78%), ratio 64, couldn't do maneuver for lung volumes or dlco   . Nodule of neck 11/07/2013    Left neck posteriorly   . Squamous cell carcinoma of skin of lower limb, including hip 07/27/2012    Nonhealing lesion of the left mid calf medially     Patient Active Problem List   Diagnosis Date Noted  . Hyponatremia 11/24/2014  . Urinary tract infection 11/24/2014  . Bronchiectasis with acute exacerbation ([place]) 11/24/2014  . HCAP  (healthcare-associated pneumonia) 11/24/2014  . Acute on chronic respiratory failure ([place]) 11/24/2014  . Infection of urinary tract 06/23/2014  . Acute upper respiratory infection 06/06/2014  . Weakness 05/22/2014  . Insomnia 11/21/2013  . Nodule of neck 11/07/2013  . Palpitations 09/13/2013  . Nocturia 09/13/2013  . Hyperlipidemia 07/19/2013  . Urine, incontinence, stress female 07/19/2013  . Type 2 diabetes with decreased circulation ([place]) 05/24/2013  . Edema 05/24/2013  . Esophageal reflux   . Pain in joint, shoulder region 07/27/2012  . Hypertension   . Arthritis   . Other malaise and fatigue   . Mucopurulent chronic bronchitis ([place])   . Pain in joint, lower leg   . COPD (chronic obstructive pulmonary disease) ([place]) 02/06/2012  . DOE (dyspnea on exertion) 04/10/2011  . Bronchiectasis without acute exacerbation ([place]) 04/10/2011  . Pulmonary nodules 04/10/2011    Allergies  Allergen Reactions  . Adhesive [Tape]     unknown  . Aspirin     unknown  . Codeine     unknown  . Enalapril Cough  . Hydrocodone     unknown  . Pantoprazole Sodium     unknown    Medications: Patient's Medications  New Prescriptions   No medications on file  Previous Medications   ACETAMINOPHEN (TYLENOL) 325 MG TABLET    Take 650 mg by mouth every 6 (six) hours as needed for moderate pain or headache.   ACETAMINOPHEN (TYLENOL) 500 MG TABLET    Take 500 mg by mouth 2 (two) times daily.    ALBUTEROL (PROVENTIL) (2.5 MG/3ML) 0.083% NEBULIZER SOLUTION    Take 3 mLs (2.5 mg total) by nebulization 3 (three) times daily.   ATORVASTATIN (LIPITOR) 10 MG TABLET    Take 10 mg by mouth daily.   CALCIUM CARBONATE ANTACID (CALCIUM ANTACID PO)    Take 600 mg by mouth 3 (three) times daily as needed (heartburn).   CARVEDILOL (COREG) 6.25 MG TABLET    Take 6.25 mg by mouth 2 (two) times daily with a meal.   CLONIDINE (CATAPRES) 0.1 MG TABLET    Take 0.1 mg by mouth. Take one tablet as needed for bp > 160/100     DOXAZOSIN (CARDURA) 8 MG TABLET    Take 8 mg by mouth daily. To control BP.   FUROSEMIDE (LASIX) 20 MG TABLET    Take 20 mg by mouth daily.   GLUCOSE BLOOD TEST STRIP    Use three time daily to test blood sugar. Contour Test Strips. Dx:E11.9   GUAIFENESIN (ROBITUSSIN) 100 MG/5ML LIQUID    Take 200 mg by mouth. Take 10 ml every 6 hours as needed for cough   INSULIN GLARGINE (LANTUS) 100 UNIT/ML INJECTION    Inject 10 Units into the skin at bedtime.    INSULIN LISPRO (HUMALOG) 100 UNIT/ML CARTRIDGE    Inject 5-8 Units into the skin 3 (three) times daily before meals. Scheduled  5 units TID if blood sugar level is about 150 give an additional 3 units= 8units   LEVOFLOXACIN (LEVAQUIN) 500 MG TABLET    Take 1 tablet (500 mg total) by mouth daily.   LOSARTAN (COZAAR) 50 MG TABLET    Take 50 mg by mouth daily.   MIRABEGRON ER (MYRBETRIQ) 50 MG TB24 TABLET    One daily to assist and bladder control   OMEPRAZOLE (PRILOSEC) 20 MG CAPSULE    Take 20 mg by mouth daily.    PREDNISONE (DELTASONE) 10 MG TABLET    Take 60 mg tablet and taper down by 10 mg daily until completed   PROMETHAZINE (PHENERGAN) 25 MG TABLET    Take 25 mg by mouth every 4 (four) hours as needed for nausea or vomiting.   SACCHAROMYCES BOULARDII (FLORASTOR) 250 MG CAPSULE    Take 250 mg by mouth 2 (two) times daily.  Modified Medications   No medications on file  Discontinued Medications   No medications on file    Physical Exam: Filed Vitals:   12/01/14 1253  BP: 150/70  Pulse: 73  Temp: 98.4 F (36.9 C)  TempSrc: Tympanic  Resp: 16   There is no weight on file to calculate BMI.  Physical Exam  Constitutional: She is oriented to person, place, and time. She appears well-developed and well-nourished. No distress.  HENT:  Head: Normocephalic and atraumatic.  Right Ear: External ear normal.  Left Ear: External ear normal.  Nose: Nose normal.  Eyes: Conjunctivae and EOM are normal. Pupils are equal, round, and reactive to  light.  Neck: Neck supple. No JVD present. No tracheal deviation present. No thyromegaly present.  Cardiovascular: Normal rate, regular rhythm, normal heart sounds and intact distal pulses.  Exam reveals no gallop and no friction rub.   No murmur heard. Pulmonary/Chest: No respiratory distress. She has no wheezes. She has rales.  Moist rales posterior mid to lower lungs  Abdominal: Bowel sounds are normal. She exhibits no distension and no mass. There is no tenderness.  Musculoskeletal: She exhibits edema (2-3+ bipedal) and tenderness.  Scars from bilateral TKR. Right knee seems to be loose when stressing the joint. No effusion. Instable gait. Driving a power scooter today. Trace edema BLE Chronic R knee pain  Lymphadenopathy:    She has no cervical adenopathy.  Neurological: She is alert and oriented to person, place, and time. She has normal reflexes. No cranial nerve deficit. Coordination normal.  Diminished vibratory and monofilament testing in both feet.  Skin: No rash (Reddened rash on both legs. Some itching and burning associated with the rash.) noted. No erythema. No pallor.  Prior exam: 6 mm nodule of the left posterior neck at the SCM muscle seems resolved Likely SCC left leg medial to shin.  Psychiatric: She has a normal mood and affect. Her behavior is normal. Judgment and thought content normal.    Labs reviewed: Basic Metabolic Panel:  Recent Labs  11/27/14 0540 11/28/14 0550 11/29/14 0615 11/30/14  NA 128* 128* 129* 132*  K 5.3* 5.0 4.8 4.9  CL 91* 90* 92*  --   CO2 29 30 29   --   GLUCOSE 188* 277* 216*  --   BUN 31* 35* 27* 26*  CREATININE 0.76 0.87 0.78 0.7  CALCIUM 8.7* 8.6* 8.9  --     Liver Function Tests:  Recent Labs  08/16/14 11/22/14 11/24/14 1013  AST 15 16 23   ALT 11 17 24   ALKPHOS 72 103 102  BILITOT  --   --  0.5  PROT  --   --  6.3*  ALBUMIN  --   --  2.8*    CBC:  Recent Labs  11/24/14 1013  11/27/14 0812 11/28/14 0550  11/29/14 0615 11/30/14  WBC 17.9*  < > 21.1* 17.4* 16.6* 14.5  NEUTROABS 14.8*  --   --   --   --   --   HGB 11.8*  < > 13.2 12.6 13.0 12.1  HCT 35.1*  < > 38.7 37.7 39.2 35*  MCV 90.7  < > 90.4 91.1 91.0  --   PLT 263  < > 274 251 245 201  < > = values in this interval not displayed.  Lab Results  Component Value Date   TSH 0.58 11/22/2014   Lab Results  Component Value Date   HGBA1C 7.6* 11/22/2014   Lab Results  Component Value Date   CHOL 108 10/27/2013   HDL 54 10/27/2013   LDLCALC 33 10/27/2013   TRIG 103 10/27/2013    Significant Diagnostic Results since last visit: none  Patient Care Team: Estill Dooms, MD as PCP - General (Internal Medicine) Clent Jacks, MD (Ophthalmology) Adrian Prows, MD as Attending Physician (Cardiology) [facility] [name], MD as Consulting Physician (Orthopedic Surgery) Kathee Delton, MD as Consulting Physician (Pulmonary Disease)  Assessment/Plan Problem List Items Addressed This Visit    Bronchiectasis with acute exacerbation ([place])    Continue O2 [place], [place], prednisone taper dose, complete total 5 day course of Levaquin. Wbc 14.5 11/30/14      Edema (Chronic)    Minimal edema, continue Furosemide 20mg  daily, Na 132, K 4.9, Bun 26, creat 0.74 11/30/14.       Esophageal reflux - Primary    C/o nauseated and indigestion, no diarrhea or constipation, continue Omeprazole, adding Zofran 4mg  tid x 3 days. Observe.      Hypertension (Chronic)    - controlled, continue Cardura 8mg  daily, Coreg 6.25mg  bid, Losartan 50mg  and prn Clonidine 0.1mg  fro Bp>160/100 - On lasix 20mg  daily, minimal BLE edema, stable.  - Continue statin       Hyponatremia    11/22/14 Na 121 11/30/14 Na 132      Pain in joint, shoulder region    Pain is managed with Tylenol 500mg  bid, multiple sites.       Type 2 diabetes with decreased circulation (HCC) (Chronic)    Continue Lantus 10 u daily, Humatolog 5u with meals. 11/22/14 Hgb A1c 7.6       Urine, incontinence, stress female (Chronic)    Continue Myrbetriq 50mg  daily, no urinary retention.           Family/ staff Communication: monitor for respiratory symptoms.   Labs/tests ordered:CBC BMP done 11/30/14  [facility] Valmai Vandenberghe NP [facility] [address], [place] [address] On Call:  [phone] & follow prompts after 5pm & weekends Office Phone:  [phone] Office Fax:  [phone]

## 2014-12-01 NOTE — Assessment & Plan Note (Signed)
Continue O2 [place], [place], prednisone taper dose, complete total 5 day course of Levaquin. Wbc 14.5 11/30/14

## 2014-12-01 NOTE — Assessment & Plan Note (Signed)
Pain is managed with Tylenol 500mg  bid, multiple sites.

## 2014-12-01 NOTE — Assessment & Plan Note (Signed)
11/22/14 Na 121 11/30/14 Na 132

## 2014-12-01 NOTE — Assessment & Plan Note (Signed)
Continue Lantus 10 u daily, Humatolog 5u with meals. 11/22/14 Hgb A1c 7.6

## 2014-12-05 ENCOUNTER — Non-Acute Institutional Stay (SKILLED_NURSING_FACILITY): Payer: Medicare Other | Admitting: Nurse Practitioner

## 2014-12-05 ENCOUNTER — Encounter: Payer: Self-pay | Admitting: Nurse Practitioner

## 2014-12-05 DIAGNOSIS — E1151 Type 2 diabetes mellitus with diabetic peripheral angiopathy without gangrene: Secondary | ICD-10-CM

## 2014-12-05 DIAGNOSIS — J189 Pneumonia, unspecified organism: Secondary | ICD-10-CM

## 2014-12-05 DIAGNOSIS — E871 Hypo-osmolality and hyponatremia: Secondary | ICD-10-CM | POA: Diagnosis not present

## 2014-12-05 DIAGNOSIS — K219 Gastro-esophageal reflux disease without esophagitis: Secondary | ICD-10-CM | POA: Diagnosis not present

## 2014-12-05 DIAGNOSIS — R609 Edema, unspecified: Secondary | ICD-10-CM | POA: Diagnosis not present

## 2014-12-05 DIAGNOSIS — I10 Essential (primary) hypertension: Secondary | ICD-10-CM | POA: Diagnosis not present

## 2014-12-05 DIAGNOSIS — K5900 Constipation, unspecified: Secondary | ICD-10-CM

## 2014-12-05 NOTE — Assessment & Plan Note (Signed)
Minimal edema, continue Furosemide 20mg  daily and Spironolactone 25mg  daily. Update CMP

## 2014-12-05 NOTE — Assessment & Plan Note (Signed)
Continue O2 [place], [place], prednisone taper dose, completed Levaquin.

## 2014-12-05 NOTE — Assessment & Plan Note (Signed)
-   controlled, continue Cardura 8mg  daily, Metoprolol 25mg  bid, Losartan 50mg  and prn Clonidine 0.1mg  fro Bp>160/100 - On lasix 20mg  and Spironolactone 25mg  daily, minimal BLE edema, stable.  - Continue statin

## 2014-12-05 NOTE — Assessment & Plan Note (Signed)
Resolved nausea and vomiting, denied abd pain or indigestion, continue Omeprazole 20mg  daily

## 2014-12-05 NOTE — Assessment & Plan Note (Signed)
No BM x 3 days, will add MiraLax daily, observe.

## 2014-12-05 NOTE — Assessment & Plan Note (Signed)
Repeat CMP, last Na 132 11/30/14.

## 2014-12-05 NOTE — Progress Notes (Signed)
Patient ID: [name], female   DOB: 04/24/1917, 81 y.o.   MRN: [id number]  Location:  SNF [facility] Provider:  Marlana Latus NP  Code Status:  DNR Goals of care: Advanced Directive information    Chief Complaint  Patient presents with  . Medical Management of Chronic Issues  . Acute Visit    blood sugar, constipation.      HPI: Patient is a 81 y.o. female seen in the SNF at [facility] today for evaluation of f/u nausea, better after 3 day scheduled Zofran, improved Bronchiectasis with acute exacerbation ([place]) HCAP (healthcare-associated pneumonia), the patient stated she is doing better and desires neb down to bid. Blood sugar runs 200s most time, takes Lantus 10u, Humalog 8 u if CBG>150, it may more controlled after Prednisone tapered off. No BM for 3 days, abd soft, no pain palpated.     Review of Systems  Constitutional: Negative for fever, chills and diaphoresis.       Baseline mobility is motorized scooter  HENT: Positive for hearing loss. Negative for congestion, ear discharge and ear pain.   Eyes: Negative for photophobia and pain.  Respiratory: Positive for cough and shortness of breath. Negative for sputum production and wheezing.   Cardiovascular: Positive for leg swelling. Negative for chest pain and palpitations.       Trace. Right knee pain.   Gastrointestinal: Positive for constipation. Negative for nausea, vomiting, abdominal pain, diarrhea and melena.  Genitourinary: Positive for frequency. Negative for dysuria and urgency.       Recurrent urinary tract infections. Nocturia. Urinary frequency.  Musculoskeletal: Negative for myalgias, back pain and neck pain.       R knee pain  Skin: Negative for rash.       Healing wound of the left hand dorsum near the thumb where a lesion was removed by Dr. Redmond Pulling at the [place] at [facility].  Neurological: Negative for dizziness, tremors, seizures, weakness and headaches.  Endo/Heme/Allergies: Negative for  environmental allergies.  Psychiatric/Behavioral: Negative for suicidal ideas and hallucinations. The patient is not nervous/anxious.     Past Medical History  Diagnosis Date  . Type II or unspecified type diabetes mellitus without mention of complication, not stated as uncontrolled   . Hypertension   . Arthritis   . COPD (chronic obstructive pulmonary disease) ([place])   . Regional enteritis of unspecified site   . Other malaise and fatigue   . Anemia, unspecified   . Other and unspecified hyperlipidemia   . Hypoxemia   . Pneumonia, organism unspecified   . Mucopurulent chronic bronchitis ([place])   . Palpitations   . Nontoxic uninodular goiter   . Abnormality of gait   . Fall from other slipping, tripping, or stumbling   . Cholelithiases   . Closed fracture of unspecified part of ramus of mandible   . Disturbance of skin sensation   . Sciatica   . Tension headache   . Coronary atherosclerosis of unspecified type of vessel, native or graft   . Osteoarthrosis, unspecified whether generalized or localized, unspecified site   . Insomnia, unspecified   . Edema   . Tension headache   . Pain in joint, lower leg 2010    right knee  . Unspecified venous (peripheral) insufficiency   . Esophageal reflux   . Rosacea   . Cataract 1990    Dr. Katy Fitch  . Pain in joint, shoulder region 2013    left  . Gastric ulcer with hemorrhage 2008  .  Urine, incontinence, stress female 07/19/2013  . Bronchiectasis without acute exacerbation ([place]) 04/10/2011    CT chest 2011   . DOE (dyspnea on exertion) 04/10/2011    PFTs 2013:  FEV1 1.04 (78%), ratio 64, couldn't do maneuver for lung volumes or dlco   . Nodule of neck 11/07/2013    Left neck posteriorly   . Squamous cell carcinoma of skin of lower limb, including hip 07/27/2012    Nonhealing lesion of the left mid calf medially     Patient Active Problem List   Diagnosis Date Noted  . Constipation 12/05/2014  . Hyponatremia 11/24/2014  . Urinary tract  infection 11/24/2014  . Bronchiectasis with acute exacerbation ([place]) 11/24/2014  . HCAP (healthcare-associated pneumonia) 11/24/2014  . Acute on chronic respiratory failure ([place]) 11/24/2014  . Infection of urinary tract 06/23/2014  . Acute upper respiratory infection 06/06/2014  . Weakness 05/22/2014  . Insomnia 11/21/2013  . Nodule of neck 11/07/2013  . Palpitations 09/13/2013  . Nocturia 09/13/2013  . Hyperlipidemia 07/19/2013  . Urine, incontinence, stress female 07/19/2013  . Type 2 diabetes with decreased circulation ([place]) 05/24/2013  . Edema 05/24/2013  . Esophageal reflux   . Pain in joint, shoulder region 07/27/2012  . Hypertension   . Arthritis   . Other malaise and fatigue   . Mucopurulent chronic bronchitis ([place])   . Pain in joint, lower leg   . COPD (chronic obstructive pulmonary disease) ([place]) 02/06/2012  . DOE (dyspnea on exertion) 04/10/2011  . Bronchiectasis without acute exacerbation ([place]) 04/10/2011  . Pulmonary nodules 04/10/2011    Allergies  Allergen Reactions  . Adhesive [Tape]     unknown  . Aspirin     unknown  . Codeine     unknown  . Enalapril Cough  . Hydrocodone     unknown  . Pantoprazole Sodium     unknown    Medications: Patient's Medications  New Prescriptions   No medications on file  Previous Medications   ACETAMINOPHEN (TYLENOL) 325 MG TABLET    Take 650 mg by mouth every 6 (six) hours as needed for moderate pain or headache.   ACETAMINOPHEN (TYLENOL) 500 MG TABLET    Take 500 mg by mouth 2 (two) times daily.    ALBUTEROL (PROVENTIL) (2.5 MG/3ML) 0.083% NEBULIZER SOLUTION    Take 3 mLs (2.5 mg total) by nebulization 3 (three) times daily.   ATORVASTATIN (LIPITOR) 10 MG TABLET    Take 10 mg by mouth daily.   CALCIUM CARBONATE ANTACID (CALCIUM ANTACID PO)    Take 600 mg by mouth 3 (three) times daily as needed (heartburn).   CARVEDILOL (COREG) 6.25 MG TABLET    Take 6.25 mg by mouth 2 (two) times daily with a meal.   CLONIDINE  (CATAPRES) 0.1 MG TABLET    Take 0.1 mg by mouth. Take one tablet as needed for bp > 160/100   DOXAZOSIN (CARDURA) 8 MG TABLET    Take 8 mg by mouth daily. To control BP.   FUROSEMIDE (LASIX) 20 MG TABLET    Take 20 mg by mouth daily.   GLUCOSE BLOOD TEST STRIP    Use three time daily to test blood sugar. Contour Test Strips. Dx:E11.9   GUAIFENESIN (ROBITUSSIN) 100 MG/5ML LIQUID    Take 200 mg by mouth. Take 10 ml every 6 hours as needed for cough   INSULIN GLARGINE (LANTUS) 100 UNIT/ML INJECTION    Inject 10 Units into the skin at bedtime.    INSULIN LISPRO (  HUMALOG) 100 UNIT/ML CARTRIDGE    Inject 5-8 Units into the skin 3 (three) times daily before meals. Scheduled 5 units TID if blood sugar level is about 150 give an additional 3 units= 8units   LEVOFLOXACIN (LEVAQUIN) 500 MG TABLET    Take 1 tablet (500 mg total) by mouth daily.   LOSARTAN (COZAAR) 50 MG TABLET    Take 50 mg by mouth daily.   MIRABEGRON ER (MYRBETRIQ) 50 MG TB24 TABLET    One daily to assist and bladder control   OMEPRAZOLE (PRILOSEC) 20 MG CAPSULE    Take 20 mg by mouth daily.    PREDNISONE (DELTASONE) 10 MG TABLET    Take 60 mg tablet and taper down by 10 mg daily until completed   PROMETHAZINE (PHENERGAN) 25 MG TABLET    Take 25 mg by mouth every 4 (four) hours as needed for nausea or vomiting.   SACCHAROMYCES BOULARDII (FLORASTOR) 250 MG CAPSULE    Take 250 mg by mouth 2 (two) times daily.  Modified Medications   No medications on file  Discontinued Medications   No medications on file    Physical Exam: Filed Vitals:   12/05/14 1005  BP: 130/88  Pulse: 90  Temp: 97.2 F (36.2 C)  TempSrc: Tympanic  Resp: 18   There is no weight on file to calculate BMI.  Physical Exam  Constitutional: She is oriented to person, place, and time. She appears well-developed and well-nourished. No distress.  HENT:  Head: Normocephalic and atraumatic.  Right Ear: External ear normal.  Left Ear: External ear normal.  Nose:  Nose normal.  Eyes: Conjunctivae and EOM are normal. Pupils are equal, round, and reactive to light.  Neck: Neck supple. No JVD present. No tracheal deviation present. No thyromegaly present.  Cardiovascular: Normal rate, regular rhythm, normal heart sounds and intact distal pulses.  Exam reveals no gallop and no friction rub.   No murmur heard. Pulmonary/Chest: No respiratory distress. She has no wheezes. She has rales.  Moist rales posterior mid to lower lungs  Abdominal: Bowel sounds are normal. She exhibits no distension and no mass. There is no tenderness.  Musculoskeletal: She exhibits edema (2-3+ bipedal) and tenderness.  Scars from bilateral TKR. Right knee seems to be loose when stressing the joint. No effusion. Instable gait. Driving a power scooter today. Trace edema BLE Chronic R knee pain  Lymphadenopathy:    She has no cervical adenopathy.  Neurological: She is alert and oriented to person, place, and time. She has normal reflexes. No cranial nerve deficit. Coordination normal.  Diminished vibratory and monofilament testing in both feet.  Skin: No rash (Reddened rash on both legs. Some itching and burning associated with the rash.) noted. No erythema. No pallor.  Prior exam: 6 mm nodule of the left posterior neck at the SCM muscle seems resolved Likely SCC left leg medial to shin.  Psychiatric: She has a normal mood and affect. Her behavior is normal. Judgment and thought content normal.    Labs reviewed: Basic Metabolic Panel:  Recent Labs  11/27/14 0540 11/28/14 0550 11/29/14 0615 11/30/14  NA 128* 128* 129* 132*  K 5.3* 5.0 4.8 4.9  CL 91* 90* 92*  --   CO2 29 30 29   --   GLUCOSE 188* 277* 216*  --   BUN 31* 35* 27* 26*  CREATININE 0.76 0.87 0.78 0.7  CALCIUM 8.7* 8.6* 8.9  --     Liver Function Tests:  Recent Labs  08/16/14  11/22/14 11/24/14 1013  AST 15 16 23   ALT 11 17 24   ALKPHOS 72 103 102  BILITOT  --   --  0.5  PROT  --   --  6.3*  ALBUMIN   --   --  2.8*    CBC:  Recent Labs  11/24/14 1013  11/27/14 0812 11/28/14 0550 11/29/14 0615 11/30/14  WBC 17.9*  < > 21.1* 17.4* 16.6* 14.5  NEUTROABS 14.8*  --   --   --   --   --   HGB 11.8*  < > 13.2 12.6 13.0 12.1  HCT 35.1*  < > 38.7 37.7 39.2 35*  MCV 90.7  < > 90.4 91.1 91.0  --   PLT 263  < > 274 251 245 201  < > = values in this interval not displayed.  Lab Results  Component Value Date   TSH 0.58 11/22/2014   Lab Results  Component Value Date   HGBA1C 7.6* 11/22/2014   Lab Results  Component Value Date   CHOL 108 10/27/2013   HDL 54 10/27/2013   LDLCALC 33 10/27/2013   TRIG 103 10/27/2013    Significant Diagnostic Results since last visit: none  Patient Care Team: Estill Dooms, MD as PCP - General (Internal Medicine) Clent Jacks, MD (Ophthalmology) Adrian Prows, MD as Attending Physician (Cardiology) [facility] [name], MD as Consulting Physician (Orthopedic Surgery) Kathee Delton, MD as Consulting Physician (Pulmonary Disease)  Assessment/Plan Problem List Items Addressed This Visit    Constipation - Primary    No BM x 3 days, will add MiraLax daily, observe.       Edema (Chronic)    Minimal edema, continue Furosemide 20mg  daily and Spironolactone 25mg  daily. Update CMP      Esophageal reflux    Resolved nausea and vomiting, denied abd pain or indigestion, continue Omeprazole 20mg  daily       HCAP (healthcare-associated pneumonia)    Continue O2 [place], [place], prednisone taper dose, completed Levaquin.       Hypertension (Chronic)    - controlled, continue Cardura 8mg  daily, Metoprolol 25mg  bid, Losartan 50mg  and prn Clonidine 0.1mg  fro Bp>160/100 - On lasix 20mg  and Spironolactone 25mg  daily, minimal BLE edema, stable.  - Continue statin       Hyponatremia    Repeat CMP, last Na 132 11/30/14.       Type 2 diabetes with decreased circulation (HCC) (Chronic)    CBG average in 200s, continue Lantus 10u qd, Humalog 8 u with  meals if CBG>150. Should be controlled CBG once Prednisone tapering off.           Family/ staff Communication: monitor for respiratory symptoms.   Labs/tests ordered: CBC and CMP  ManXie Biviana Saddler NP Geriatrics [facility] [address], [place] [address] On Call:  [phone] & follow prompts after 5pm & weekends Office Phone:  [phone] Office Fax:  [phone]

## 2014-12-05 NOTE — Assessment & Plan Note (Signed)
CBG average in 200s, continue Lantus 10u qd, Humalog 8 u with meals if CBG>150. Should be controlled CBG once Prednisone tapering off.

## 2014-12-06 ENCOUNTER — Encounter: Payer: Self-pay | Admitting: Internal Medicine

## 2014-12-06 DIAGNOSIS — R000 Tachycardia, unspecified: Secondary | ICD-10-CM

## 2014-12-06 HISTORY — DX: Tachycardia, unspecified: R00.0

## 2014-12-06 NOTE — Progress Notes (Signed)
Patient ID: [name], female   DOB: [birth year], 81 y.o.   MRN: [id number]    HISTORY AND PHYSICAL  Location:  [facility] Room Number: N 85 Place of Service: SNF (31)   Extended Emergency Contact Information Primary Emergency Contact: Yu,[name] Address: [place], [place] [address] [place] Phone: [phone] Mobile Phone: [phone] Relation: Son Secondary Emergency Contact: Soileau,[name]  [place] Mobile Phone: [phone] Relation: Daughter  Advanced Directive information Does patient have an advance directive?: Yes, Type of Advance Directive: Press photographer;Living will, Does patient want to make changes to advanced directive?: No - Patient declined  Chief Complaint  Patient presents with  . Readmit To SNF    Following hospitalization    HPI:  Readmitted to SNF on 11/29/2014 following hospitalization from 11/24/2014 through 11/29/2014 for acute on chronic respiratory failure. Following evaluation is felt this is most likely due to her COPD with chronic bronchitis. She was treated with antibiotics. X-rays have not revealed a pneumonia.  Patient is quite debilitated and weak. She is able to stand on her own, but is at risk for falling. She is admitted to the skilled nursing facility for strengthening, improvement in mobility, and improvement in self-care.  Dysphagia may be a significant problem. She has known GERD. She is on some thickened liquids at the present time.  Patient was noted to be hyponatremic in the hospital and this will need to be followed up on.  She has urinary incontinence.  She has diabetic with decreased circulation. This problem is under reasonably good control.  Patient has had recent UTI, which seems resolved.  Previous treatment with spironolactone for congestive heart failure as well as fluid management was stopped during her hospitalization  because of hyperkalemia.  Hypertension is currently under control with Cardura, Coreg, and losartan.  She is quite weak. During physical therapy, they have noted significant tachycardia with rates up to 220. She has a moderate tachycardia at rest also.  Past Medical History  Diagnosis Date  . Type II or unspecified type diabetes mellitus without mention of complication, not stated as uncontrolled   . Hypertension   . Arthritis   . COPD (chronic obstructive pulmonary disease) ([place])   . Regional enteritis of unspecified site   . Other malaise and fatigue   . Anemia, unspecified   . Other and unspecified hyperlipidemia   . Hypoxemia   . Pneumonia, organism unspecified   . Mucopurulent chronic bronchitis ([place])   . Palpitations   . Nontoxic uninodular goiter   . Abnormality of gait   . Fall from other slipping, tripping, or stumbling   . Cholelithiases   . Closed fracture of unspecified part of ramus of mandible   . Disturbance of skin sensation   . Sciatica   . Tension headache   . Coronary atherosclerosis of unspecified type of vessel, native or graft   . Osteoarthrosis, unspecified whether generalized or localized, unspecified site   . Insomnia, unspecified   . Edema   . Tension headache   . Pain in joint, lower leg 2010    right knee  . Unspecified venous (peripheral) insufficiency   . Esophageal reflux   . Rosacea   . Cataract 1990    Dr. Katy Fitch  . Pain in joint, shoulder region 2013    left  . Gastric ulcer with hemorrhage 2008  . Urine, incontinence,  stress female 07/19/2013  . Bronchiectasis without acute exacerbation ([place]) 04/10/2011    CT chest 2011   . DOE (dyspnea on exertion) 04/10/2011    PFTs 2013:  FEV1 1.04 (78%), ratio 64, couldn't do maneuver for lung volumes or dlco   . Nodule of neck 11/07/2013    Left neck posteriorly   . Squamous cell carcinoma of skin of lower limb, including hip 07/27/2012    Nonhealing lesion of the left mid calf medially     Past  Surgical History  Procedure Laterality Date  . Total knee arthroplasty Bilateral 1993, 1998    knees  . Other surgical history  2008    Ulcer surgery   . Small intestine surgery    . Eye surgery Bilateral 1990    Dr. Katy Fitch  . Gastrojejunostomy  05/2004    secondary to ulcer  . Joint replacement Bilateral 1993-1998    total knee replacement  . Mole removal  05/11/14    left hand and left side of face [facility]    Patient Care Team: Estill Dooms, MD as PCP - General (Internal Medicine) Clent Jacks, MD (Ophthalmology) Adrian Prows, MD as Attending Physician (Cardiology) [facility] [name], MD as Consulting Physician (Orthopedic Surgery) Kathee Delton, MD as Consulting Physician (Pulmonary Disease)  Social History   Social History  . Marital Status: Widowed    Spouse Name: N/A  . Number of Children: 2  . Years of Education: N/A   Occupational History  . retired Agricultural engineer.     Social History Main Topics  . Smoking status: Former Smoker -- 0.30 packs/day for 10 years    Types: Cigarettes    Quit date: 02/03/1973  . Smokeless tobacco: Never Used     Comment: former casual smoker, lots of second hand smoke exposure  . Alcohol Use: No  . Drug Use: No  . Sexual Activity: No   Other Topics Concern  . Not on file   Social History Narrative   Pt lives at [facility] in the independent living section since 1994. Moved to [place] 12/08/2013   Pt lives alone.   Has a living will, POA, DNR   Power scooter and rolling walker   Exercise none   Never smoked   Alcohol none                    reports that she quit smoking about 41 years ago. Her smoking use included Cigarettes. She has a 3 pack-year smoking history. She has never used smokeless tobacco. She reports that she does not drink alcohol or use illicit drugs.  Family History  Problem Relation Age of Onset  . Heart disease Father   . Colon cancer Sister   . Diabetes Brother   . Obesity  Brother   . Mental retardation Daughter   . Obesity Daughter    Family Status  Relation Status Death Age  . Father Deceased 44    CAD, Stomach ulcers  . Sister Deceased     cervical and colon cancer  . Brother Alive   . Daughter Alive     Mental retardation, encephalitis  . Mother Deceased 72    History of TIA'S  . Son Alive     Immunization History  Administered Date(s) Administered  . Influenza Split 11/06/2011, 11/04/2012  . Influenza,inj,Quad PF,36+ Mos 11/17/2013  . Pneumococcal Conjugate-13 08/11/2014  . Pneumococcal Polysaccharide-23 02/04/2003  . Tdap 02/27/2013  . Zoster  02/03/2006    Allergies  Allergen Reactions  . Adhesive [Tape]     unknown  . Aspirin     unknown  . Codeine     unknown  . Enalapril Cough  . Hydrocodone     unknown  . Pantoprazole Sodium     unknown    Medications: Patient's Medications  New Prescriptions   No medications on file  Previous Medications   ACETAMINOPHEN (TYLENOL) 325 MG TABLET    Take 650 mg by mouth every 6 (six) hours as needed for moderate pain or headache.   ACETAMINOPHEN (TYLENOL) 500 MG TABLET    Take 500 mg by mouth 2 (two) times daily.    ALBUTEROL (PROVENTIL) (2.5 MG/3ML) 0.083% NEBULIZER SOLUTION    Take 3 mLs (2.5 mg total) by nebulization 3 (three) times daily.   ATORVASTATIN (LIPITOR) 10 MG TABLET    Take 10 mg by mouth daily.   CALCIUM CARBONATE ANTACID (CALCIUM ANTACID PO)    Take 600 mg by mouth 3 (three) times daily as needed (heartburn).   CARVEDILOL (COREG) 6.25 MG TABLET    Take 6.25 mg by mouth 2 (two) times daily with a meal.   CLONIDINE (CATAPRES) 0.1 MG TABLET    Take 0.1 mg by mouth. Take one tablet as needed for bp > 160/100   DOXAZOSIN (CARDURA) 8 MG TABLET    Take 8 mg by mouth daily. To control BP.   FUROSEMIDE (LASIX) 20 MG TABLET    Take 20 mg by mouth daily.   GLUCOSE BLOOD TEST STRIP    Use three time daily to test blood sugar. Contour Test Strips. Dx:E11.9   GUAIFENESIN (ROBITUSSIN)  100 MG/5ML LIQUID    Take 200 mg by mouth. Take 10 ml every 6 hours as needed for cough   INSULIN GLARGINE (LANTUS) 100 UNIT/ML INJECTION    Inject 10 Units into the skin at bedtime.    INSULIN LISPRO (HUMALOG) 100 UNIT/ML CARTRIDGE    Inject 5-8 Units into the skin 3 (three) times daily before meals. Scheduled 5 units TID if blood sugar level is about 150 give an additional 3 units= 8units   LEVOFLOXACIN (LEVAQUIN) 500 MG TABLET    Take 1 tablet (500 mg total) by mouth daily.   LOSARTAN (COZAAR) 50 MG TABLET    Take 50 mg by mouth daily.   MIRABEGRON ER (MYRBETRIQ) 50 MG TB24 TABLET    One daily to assist and bladder control   OMEPRAZOLE (PRILOSEC) 20 MG CAPSULE    Take 20 mg by mouth daily.    PREDNISONE (DELTASONE) 10 MG TABLET    Take 60 mg tablet and taper down by 10 mg daily until completed   PREDNISONE (DELTASONE) 5 MG TABLET       PROMETHAZINE (PHENERGAN) 25 MG TABLET    Take 25 mg by mouth every 4 (four) hours as needed for nausea or vomiting.   SACCHAROMYCES BOULARDII (FLORASTOR) 250 MG CAPSULE    Take 250 mg by mouth 2 (two) times daily.   SPIRONOLACTONE (ALDACTONE) 25 MG TABLET      Modified Medications   No medications on file  Discontinued Medications   No medications on file    Review of Systems  Constitutional: Negative for fever, chills and diaphoresis.       Baseline mobility is motorized scooter  HENT: Positive for hearing loss. Negative for congestion, ear discharge, ear pain, nosebleeds, sore throat and tinnitus.   Eyes: Negative for photophobia and pain.  Respiratory: Positive for cough, shortness of breath and wheezing. Negative for stridor.        Dyspneic at rest with oxygen dependency.  Cardiovascular: Positive for leg swelling. Negative for chest pain and palpitations.       Trace. Right knee pain.   Gastrointestinal: Negative for nausea, vomiting, abdominal pain, diarrhea, constipation and blood in stool.  Endocrine: Negative for polydipsia.  Genitourinary:  Positive for frequency. Negative for dysuria, urgency, hematuria and flank pain.       Recurrent urinary tract infections.  Incontinent of urine  Nocturia. Urinary frequency.  Musculoskeletal: Negative for myalgias, back pain and neck pain.       R knee pain  Skin: Negative.  Negative for rash.  Allergic/Immunologic: Negative for environmental allergies.  Neurological: Positive for weakness. Negative for dizziness, tremors, seizures and headaches.  Hematological: Does not bruise/bleed easily.  Psychiatric/Behavioral: Negative for suicidal ideas and hallucinations. The patient is not nervous/anxious.     Filed Vitals:   12/06/14 1550  BP: 134/61  Pulse: 85  Temp: 98.5 F (36.9 C)  Resp: 20  Height: _0  (1.626 m)  Weight: 187 lb (84.823 kg)  SpO2: 98%   Body mass index is 32.08 kg/(m^2).  Physical Exam  Constitutional: She is oriented to person, place, and time. She appears well-developed and well-nourished. No distress.  HENT:  Head: Normocephalic and atraumatic.  Right Ear: External ear normal.  Left Ear: External ear normal.  Nose: Nose normal.  Eyes: Conjunctivae and EOM are normal. Pupils are equal, round, and reactive to light.  Neck: Neck supple. No JVD present. No tracheal deviation present. No thyromegaly present.  Cardiovascular: Normal rate, regular rhythm, normal heart sounds and intact distal pulses.  Exam reveals no gallop and no friction rub.   No murmur heard. Pulmonary/Chest: No respiratory distress. She has wheezes. She has rales.  Abdominal: Bowel sounds are normal. She exhibits no distension and no mass. There is no tenderness.  Musculoskeletal: She exhibits edema (2-3+ bipedal) and tenderness.  Scars from bilateral TKR. Right knee seems to be loose when stressing the joint. No effusion. Instable gait. Driving a power scooter today. Trace edema BLE Chronic R knee pain  Lymphadenopathy:    She has no cervical adenopathy.  Neurological: She is alert  and oriented to person, place, and time. She has normal reflexes. No cranial nerve deficit. Coordination normal.  Diminished vibratory and monofilament testing in both feet.  Skin: No rash (Reddened rash on both legs. Some itching and burning associated with the rash.) noted. No erythema. No pallor.  Prior exam: 6 mm nodule of the left posterior neck at the SCM muscle seems resolved Likely SCC left leg medial to shin.  Psychiatric: She has a normal mood and affect. Her behavior is normal. Judgment and thought content normal.    Labs reviewed: Lab Summary Latest Ref Rng 11/30/2014 11/29/2014 11/28/2014 11/27/2014 11/26/2014 11/24/2014  Hemoglobin 12.0 - 16.0 g/dL 12.1 13.0 12.6 13.2 12.0 11.8(L)  Hematocrit 36 - 46 % 35(A) 39.2 37.7 38.7 35.8(L) 35.1(L)  White count - 14.5 16.6(H) 17.4(H) 21.1(H) 25.2(H) 17.9(H)  Platelet count 150 - 399 K/L 201 245 251 274 293 263  Sodium 137 - 147 mmol/L 132(A) 129(L) 128(L) 128(L) 127(L) 125(L)  Potassium 3.4 - 5.3 mmol/L 4.9 4.8 5.0 5.3(H) 4.8 5.1  Calcium 8.9 - 10.3 mg/dL (None) 8.9 8.6(L) 8.7(L) 9.1 9.2  Phosphorus - (None) (None) (None) (None) (None) (None)  Creatinine 0.5 - 1.1 mg/dL 0.7 0.78 0.87 0.76  0.84 0.82  AST 15 - 41 U/L (None) (None) (None) (None) (None) 23  Alk Phos 38 - 126 U/L (None) (None) (None) (None) (None) 102  Bilirubin 0.3 - 1.2 mg/dL (None) (None) (None) (None) (None) 0.5  Glucose - 246 216(H) 277(H) 188(H) 302(H) 154(H)  Cholesterol - (None) (None) (None) (None) (None) (None)  HDL cholesterol - (None) (None) (None) (None) (None) (None)  Triglycerides - (None) (None) (None) (None) (None) (None)  LDL Direct - (None) (None) (None) (None) (None) (None)  LDL Calc - (None) (None) (None) (None) (None) (None)  Total protein 6.5 - 8.1 g/dL (None) (None) (None) (None) (None) 6.3(L)  Albumin 3.5 - 5.0 g/dL (None) (None) (None) (None) (None) 2.8(L)   Lab Results  Component Value Date   BUN 26* 11/30/2014   Lab Results  Component  Value Date   HGBA1C 7.6* 11/22/2014   Lab Results  Component Value Date   TSH 0.58 11/22/2014          Dg Chest 2 View  11/24/2014  CLINICAL DATA:  Cough, congestion, low-grade fever for 10 days. EXAM: CHEST  2 VIEW COMPARISON:  11/14/2013 FINDINGS: Interstitial prominence throughout the lungs has improved since prior study. Residual interstitial changes likely reflects underlying chronic lung disease. No confluent opacities or effusions. Heart is normal size. Diffuse calcifications throughout the aorta. Degenerative changes in the shoulders, left greater than right. IMPRESSION: Improving interstitial prominence with mild persistent interstitial prominence, likely chronic interstitial disease. Electronically Signed   By: Rolm Baptise M.D.   On: 11/24/2014 10:46   Dg Swallowing Func-speech Pathology  11/28/2014  Objective Swallowing Evaluation:   Patient Details Name: [name] MRN: [id number] Date of Birth: Mar 21, 1917 Today's Date: 11/28/2014 Time: SLP Start Time (ACUTE ONLY): 1340-SLP Stop Time (ACUTE ONLY): 1415 SLP Time Calculation (min) (ACUTE ONLY): 35 min Past Medical History: Past Medical History Diagnosis Date . Type II or unspecified type diabetes mellitus without mention of complication, not stated as uncontrolled  . Hypertension  . Arthritis  . COPD (chronic obstructive pulmonary disease) ([place])  . Regional enteritis of unspecified site  . Other malaise and fatigue  . Anemia, unspecified  . Other and unspecified hyperlipidemia  . Hypoxemia  . Pneumonia, organism unspecified  . Mucopurulent chronic bronchitis ([place])  . Palpitations  . Nontoxic uninodular goiter  . Abnormality of gait  . Fall from other slipping, tripping, or stumbling  . Cholelithiases  . Closed fracture of unspecified part of ramus of mandible  . Disturbance of skin sensation  . Sciatica  . Tension headache  . Coronary atherosclerosis of unspecified type of vessel, native or graft  . Osteoarthrosis, unspecified  whether generalized or localized, unspecified site  . Insomnia, unspecified  . Edema  . Tension headache  . Pain in joint, lower leg 2010   right knee . Unspecified venous (peripheral) insufficiency  . Esophageal reflux  . Rosacea  . Cataract 1990   Dr. Katy Fitch . Pain in joint, shoulder region 2013   left . Gastric ulcer with hemorrhage 2008 . Urine, incontinence, stress female 07/19/2013 . Bronchiectasis without acute exacerbation ([place]) 04/10/2011   CT chest 2011  . DOE (dyspnea on exertion) 04/10/2011   PFTs 2013:  FEV1 1.04 (78%), ratio 64, couldn't do maneuver for lung volumes or dlco  . Nodule of neck 11/07/2013   Left neck posteriorly  . Squamous cell carcinoma of skin of lower limb, including hip 07/27/2012   Nonhealing lesion of the left mid calf medially  Past Surgical  History: Past Surgical History Procedure Laterality Date . Total knee arthroplasty Bilateral 1993, 1998   knees . Other surgical history  2008   Ulcer surgery  . Small intestine surgery   . Eye surgery Bilateral 1990   Dr. Katy Fitch . Gastrojejunostomy  05/2004   secondary to ulcer . Joint replacement Bilateral 1993-1998   total knee replacement . Mole removal  05/11/14   left hand and left side of face [facility] HPI: Other Pertinent Information: 81 y.o. female with a PMH of chronic respiratory failure, COPD, GERD, pna, DM, gastric ulcer with hemorrhage. Per MD on 11/21/14 she was seen by her PCP with wheezing and cough, patient's symptoms have progressed.Found to have acute on chronic resp failure secondary to bronchiectasis with acute exacerbation, mucopurulent chronic bronchitis and possible HCAP. CXR 10/21 Improving interstitial prominence with mild persistent interstitial prominence, likely chronic interstitial disease. Pt denied coughing with food/liquid. Comment on order states RN noted pt coughing with food in take.  No Data Recorded Assessment / Plan / Recommendation CHL IP CLINICAL IMPRESSIONS 11/28/2014 Therapy Diagnosis Mild oral  phase dysphagia;Mild pharyngeal phase dysphagia Clinical Impression Pt without aspiration of any consistency tested *barium tablet, cracker, pudding, nectar & thin.  Swallow was timely without severe residue.  Mild amount of oropharyngeal residuals WITHOUT pt sensation due to mild weakness. Cued dry swallows helpful to decrease residuals.  Barium tablet swallowed with thin liquid appeared to pause at mid-esophagus with referent sensation to left side of pharynx.  Further intake of liquids facilitated clearance.  Pt did cough prior to and during MBS without barium visualized in larynx/trachea. Mildly prominent cricopharyngeus noted.  Appearance of slow clearance at distal esophagus with esophagus appearing wide and patent- radiologist not present to confirm. Frequent belching noted throughout MBS without barium backflowing into pharynx.  Overt coughing with face turning red noted after pt returned to bed and HOB lowered that may indicate reflux issues.  Suspect primary aspiration concern is from reflux - pt does admit to consuming Tums daily at home.  Advised her to speak to MD regarding this matter.     CHL IP TREATMENT RECOMMENDATION 11/28/2014 Treatment Recommendations Therapy as outlined in treatment plan below   CHL IP DIET RECOMMENDATION 11/28/2014 SLP Diet Recommendations Dysphagia 3 (Mech soft);Thin Liquid Administration via (None) Medication Administration (No Data) Compensations Slow rate;Small sips/bites Postural Changes and/or Swallow Maneuvers (None)   CHL IP OTHER RECOMMENDATIONS 11/28/2014 Recommended Consults (None) Oral Care Recommendations Oral care BID Other Recommendations (None)   No flowsheet data found.  CHL IP FREQUENCY AND DURATION 11/28/2014 Speech Therapy Frequency (ACUTE ONLY) min 2x/week Treatment Duration 1 week   Pertinent Vitals/Pain  SLP Swallow Goals No flowsheet data found. No flowsheet data found.   CHL IP REASON FOR REFERRAL 11/28/2014 Reason for Referral Objectively evaluate  swallowing function   CHL IP ORAL PHASE 11/28/2014 Lips (None) Tongue (None) Mucous membranes (None) Nutritional status (None) Other (None) Oxygen therapy (None) Oral Phase Impaired Oral - Pudding Teaspoon (None) Oral - Pudding Cup (None) Oral - Honey Teaspoon (None) Oral - Honey Cup (None) Oral - Honey Syringe (None) Oral - Nectar Teaspoon (None) Oral - Nectar Cup (None) Oral - Nectar Straw (None) Oral - Nectar Syringe (None) Oral - Ice Chips (None) Oral - Thin Teaspoon (None) Oral - Thin Cup (None) Oral - Thin Straw (None) Oral - Thin Syringe (None) Oral - Puree (None) Oral - Mechanical Soft (None) Oral - Regular (None) Oral - Multi-consistency (None) Oral - Pill (None)  Oral Phase - Comment (None)   CHL IP PHARYNGEAL PHASE 11/28/2014 Pharyngeal Phase Impaired Pharyngeal - Pudding Teaspoon (None) Penetration/Aspiration details (pudding teaspoon) (None) Pharyngeal - Pudding Cup (None) Penetration/Aspiration details (pudding cup) (None) Pharyngeal - Honey Teaspoon (None) Penetration/Aspiration details (honey teaspoon) (None) Pharyngeal - Honey Cup (None) Penetration/Aspiration details (honey cup) (None) Pharyngeal - Honey Syringe (None) Penetration/Aspiration details (honey syringe) (None) Pharyngeal - Nectar Teaspoon (None) Penetration/Aspiration details (nectar teaspoon) (None) Pharyngeal - Nectar Cup (None) Penetration/Aspiration details (nectar cup) (None) Pharyngeal - Nectar Straw (None) Penetration/Aspiration details (nectar straw) (None) Pharyngeal - Nectar Syringe (None) Penetration/Aspiration details (nectar syringe) (None) Pharyngeal - Ice Chips (None) Penetration/Aspiration details (ice chips) (None) Pharyngeal - Thin Teaspoon (None) Penetration/Aspiration details (thin teaspoon) (None) Pharyngeal - Thin Cup (None) Penetration/Aspiration details (thin cup) (None) Pharyngeal - Thin Straw (None) Penetration/Aspiration details (thin straw) (None) Pharyngeal - Thin Syringe (None) Penetration/Aspiration details  (thin syringe') (None) Pharyngeal - Puree (None) Penetration/Aspiration details (puree) (None) Pharyngeal - Mechanical Soft (None) Penetration/Aspiration details (mechanical soft) (None) Pharyngeal - Regular (None) Penetration/Aspiration details (regular) (None) Pharyngeal - Multi-consistency (None) Penetration/Aspiration details (multi-consistency) (None) Pharyngeal - Pill (None) Penetration/Aspiration details (pill) (None) Pharyngeal Comment pt with mild lingual base residuals without full sensation, cued dry swallows helpful to decrease residue   CHL IP CERVICAL ESOPHAGEAL PHASE 11/28/2014 Cervical Esophageal Phase Impaired Pudding Teaspoon (None) Pudding Cup (None) Honey Teaspoon (None) Honey Cup (None) Honey Straw (None) Nectar Teaspoon (None) Nectar Cup (None) Nectar Straw (None) Nectar Sippy Cup (None) Thin Teaspoon (None) Thin Cup (None) Thin Straw (None) Thin Sippy Cup (None) Cervical Esophageal Comment appearance of prominent CP No flowsheet data found.   Luanna Salk, [facility] SLP [phone]     Assessment/Plan  1. Simple chronic bronchitis ([place]) Finish Levaquin  Due to continued shortness of breath on changing albuterol to DuoNeb nebs every 6 hours An Pulmicort via nebulizer every 6 hours  2. Essential hypertension Change Coreg to metoprolol. Continue other medications as listed.  3. Type 2 diabetes with decreased circulation ([facility]) Continue to monitor on current medication  4. Edema, unspecified type No change in diuretics  5. Weakness Continue with physical therapy. I instructed therapy to continue to work with her without restrictions other than the fact that she must wear oxygen at all times, especially while walking.  6. Hyponatremia Follow-up lab  7. Acute on chronic respiratory failure, unspecified whether with hypoxia or hypercapnia ([facility]) Continue oxygen  8. Bronchiectasis with acute exacerbation ([place]) Finish antibiotics  9. Tachycardia Discontinue Coreg Resume  metoprolol tartrate for rate control and congestive heart failure

## 2014-12-06 NOTE — Progress Notes (Signed)
This encounter was created in error - please disregard.

## 2014-12-07 LAB — CBC AND DIFFERENTIAL
HEMATOCRIT: 31 % — AB (ref 36–46)
HEMOGLOBIN: 10.4 g/dL — AB (ref 12.0–16.0)
PLATELETS: 198 10*3/uL (ref 150–399)
WBC: 13.7 10*3/mL

## 2014-12-07 LAB — BASIC METABOLIC PANEL
BUN: 26 mg/dL — AB (ref 4–21)
CREATININE: 0.9 mg/dL (ref <=1.1)
Glucose: 139 mg/dL
Potassium: 5.1 mmol/L (ref 3.4–5.3)
Sodium: 131 mmol/L — AB (ref 137–147)

## 2014-12-07 LAB — HEPATIC FUNCTION PANEL
ALK PHOS: 77 U/L (ref 25–125)
ALT: 11 U/L (ref 7–35)
AST: 12 U/L — AB (ref 13–35)
BILIRUBIN, TOTAL: 0.5 mg/dL

## 2014-12-08 ENCOUNTER — Other Ambulatory Visit: Payer: Self-pay | Admitting: Nurse Practitioner

## 2014-12-08 ENCOUNTER — Encounter: Payer: Self-pay | Admitting: Nurse Practitioner

## 2014-12-08 DIAGNOSIS — D638 Anemia in other chronic diseases classified elsewhere: Secondary | ICD-10-CM | POA: Insufficient documentation

## 2014-12-08 DIAGNOSIS — I5042 Chronic combined systolic (congestive) and diastolic (congestive) heart failure: Secondary | ICD-10-CM | POA: Insufficient documentation

## 2014-12-15 ENCOUNTER — Encounter: Payer: Self-pay | Admitting: Nurse Practitioner

## 2014-12-15 ENCOUNTER — Non-Acute Institutional Stay (SKILLED_NURSING_FACILITY): Payer: Medicare Other | Admitting: Nurse Practitioner

## 2014-12-15 DIAGNOSIS — E871 Hypo-osmolality and hyponatremia: Secondary | ICD-10-CM

## 2014-12-15 DIAGNOSIS — K219 Gastro-esophageal reflux disease without esophagitis: Secondary | ICD-10-CM

## 2014-12-15 DIAGNOSIS — D638 Anemia in other chronic diseases classified elsewhere: Secondary | ICD-10-CM | POA: Diagnosis not present

## 2014-12-15 DIAGNOSIS — E1151 Type 2 diabetes mellitus with diabetic peripheral angiopathy without gangrene: Secondary | ICD-10-CM

## 2014-12-15 DIAGNOSIS — J439 Emphysema, unspecified: Secondary | ICD-10-CM

## 2014-12-15 DIAGNOSIS — I10 Essential (primary) hypertension: Secondary | ICD-10-CM | POA: Diagnosis not present

## 2014-12-15 DIAGNOSIS — R609 Edema, unspecified: Secondary | ICD-10-CM | POA: Diagnosis not present

## 2014-12-15 DIAGNOSIS — K5900 Constipation, unspecified: Secondary | ICD-10-CM

## 2014-12-15 NOTE — Progress Notes (Signed)
Patient ID: [name], female   DOB: 30-Oct-1917, 81 y.o.   MRN: [id number]  Location:  SNF [facility] Provider:  Marlana Latus NP  Code Status:  DNR Goals of care: Advanced Directive information    Chief Complaint  Patient presents with  . Medical Management of Chronic Issues  . Acute Visit    low grade T 99.7, productive cough     HPI: Patient is a 81 y.o. female seen in the SNF at [facility] today for evaluation of staff reported the patient's productive cough, T 99.7, the patient stated she is no SOB, wants to change Neb to prn, overall  improved Bronchiectasis with acute exacerbation ([place]) HCAP (healthcare-associated pneumonia), the patient stated she is doing better and desires neb down to bid. Blood sugar runs 200s most time, takes Lantus 10u, Humalog 8 u if CBG>150, it may more controlled after Prednisone tapered off. No BM for 3 days, abd soft, no pain palpated.   Review of Systems  Constitutional: Negative for fever, chills and diaphoresis.       Baseline mobility is motorized scooter  HENT: Positive for hearing loss. Negative for congestion, ear discharge and ear pain.   Eyes: Negative for photophobia and pain.  Respiratory: Positive for cough and shortness of breath. Negative for sputum production and wheezing.   Cardiovascular: Positive for leg swelling. Negative for chest pain and palpitations.       Trace. Right knee pain.   Gastrointestinal: Positive for constipation. Negative for nausea, vomiting, abdominal pain, diarrhea and melena.  Genitourinary: Positive for frequency. Negative for dysuria and urgency.       Recurrent urinary tract infections. Nocturia. Urinary frequency.  Musculoskeletal: Negative for myalgias, back pain and neck pain.       R knee pain  Skin: Negative for rash.       Healing wound of the left hand dorsum near the thumb where a lesion was removed by Dr. Redmond Pulling at the [facility].  Neurological: Negative for dizziness,  tremors, seizures, weakness and headaches.  Endo/Heme/Allergies: Negative for environmental allergies.  Psychiatric/Behavioral: Negative for suicidal ideas and hallucinations. The patient is not nervous/anxious.     Past Medical History  Diagnosis Date  . Type II or unspecified type diabetes mellitus without mention of complication, not stated as uncontrolled   . Hypertension   . Arthritis   . COPD (chronic obstructive pulmonary disease) ([place])   . Regional enteritis of unspecified site   . Other malaise and fatigue   . Anemia, unspecified   . Other and unspecified hyperlipidemia   . Hypoxemia   . Pneumonia, organism unspecified   . Mucopurulent chronic bronchitis ([place])   . Palpitations   . Nontoxic uninodular goiter   . Abnormality of gait   . Fall from other slipping, tripping, or stumbling   . Cholelithiases   . Closed fracture of unspecified part of ramus of mandible   . Disturbance of skin sensation   . Sciatica   . Tension headache   . Coronary atherosclerosis of unspecified type of vessel, native or graft   . Osteoarthrosis, unspecified whether generalized or localized, unspecified site   . Insomnia, unspecified   . Edema   . Tension headache   . Pain in joint, lower leg 2010    right knee  . Unspecified venous (peripheral) insufficiency   . Esophageal reflux   . Rosacea   . Cataract 1990    Dr. Katy Fitch  . Pain in  joint, shoulder region 2013    left  . Gastric ulcer with hemorrhage 2008  . Urine, incontinence, stress female 07/19/2013  . Bronchiectasis without acute exacerbation ([place]) 04/10/2011    CT chest 2011   . DOE (dyspnea on exertion) 04/10/2011    PFTs 2013:  FEV1 1.04 (78%), ratio 64, couldn't do maneuver for lung volumes or dlco   . Nodule of neck 11/07/2013    Left neck posteriorly   . Squamous cell carcinoma of skin of lower limb, including hip 07/27/2012    Nonhealing lesion of the left mid calf medially   . Tachycardia 12/06/2014    Patient Active  Problem List   Diagnosis Date Noted  . Chronic combined systolic and diastolic congestive heart failure ([place]) 12/08/2014  . Anemia of chronic disease 12/08/2014  . Tachycardia 12/06/2014  . Constipation 12/05/2014  . Hyponatremia 11/24/2014  . Bronchiectasis with acute exacerbation ([place]) 11/24/2014  . HCAP (healthcare-associated pneumonia) 11/24/2014  . Acute on chronic respiratory failure ([place]) 11/24/2014  . Infection of urinary tract 06/23/2014  . Weakness 05/22/2014  . Insomnia 11/21/2013  . Nodule of neck 11/07/2013  . Palpitations 09/13/2013  . Nocturia 09/13/2013  . Hyperlipidemia 07/19/2013  . Urine, incontinence, stress female 07/19/2013  . Type 2 diabetes with decreased circulation ([place]) 05/24/2013  . Edema 05/24/2013  . Esophageal reflux   . Pain in joint, shoulder region 07/27/2012  . Hypertension   . Arthritis   . Other malaise and fatigue   . Mucopurulent chronic bronchitis ([place])   . Pain in joint, lower leg   . COPD (chronic obstructive pulmonary disease) ([place]) 02/06/2012  . DOE (dyspnea on exertion) 04/10/2011  . Bronchiectasis without acute exacerbation ([place]) 04/10/2011  . Pulmonary nodules 04/10/2011    Allergies  Allergen Reactions  . Adhesive [Tape]     unknown  . Aspirin     unknown  . Codeine     unknown  . Enalapril Cough  . Hydrocodone     unknown  . Pantoprazole Sodium     unknown    Medications: Patient's Medications  New Prescriptions   No medications on file  Previous Medications   ACETAMINOPHEN (TYLENOL) 325 MG TABLET    Take 650 mg by mouth every 6 (six) hours as needed for moderate pain or headache.   ACETAMINOPHEN (TYLENOL) 500 MG TABLET    Take 500 mg by mouth 2 (two) times daily.    ALBUTEROL (PROVENTIL) (2.5 MG/3ML) 0.083% NEBULIZER SOLUTION    Take 3 mLs (2.5 mg total) by nebulization 3 (three) times daily.   ATORVASTATIN (LIPITOR) 10 MG TABLET    Take 10 mg by mouth daily.   CALCIUM CARBONATE ANTACID (CALCIUM ANTACID PO)     Take 600 mg by mouth 3 (three) times daily as needed (heartburn).   CARVEDILOL (COREG) 6.25 MG TABLET    Take 6.25 mg by mouth 2 (two) times daily with a meal.   CLONIDINE (CATAPRES) 0.1 MG TABLET    Take 0.1 mg by mouth. Take one tablet as needed for bp > 160/100   DOXAZOSIN (CARDURA) 8 MG TABLET    Take 8 mg by mouth daily. To control BP.   FUROSEMIDE (LASIX) 20 MG TABLET    Take 20 mg by mouth daily.   GLUCOSE BLOOD TEST STRIP    Use three time daily to test blood sugar. Contour Test Strips. Dx:E11.9   GUAIFENESIN (ROBITUSSIN) 100 MG/5ML LIQUID    Take 200 mg by mouth. Take 10 ml  every 6 hours as needed for cough   INSULIN GLARGINE (LANTUS) 100 UNIT/ML INJECTION    Inject 10 Units into the skin at bedtime.    INSULIN LISPRO (HUMALOG) 100 UNIT/ML CARTRIDGE    Inject 5-8 Units into the skin 3 (three) times daily before meals. Scheduled 5 units TID if blood sugar level is about 150 give an additional 3 units= 8units   LEVOFLOXACIN (LEVAQUIN) 500 MG TABLET    Take 1 tablet (500 mg total) by mouth daily.   LOSARTAN (COZAAR) 50 MG TABLET    Take 50 mg by mouth daily.   MIRABEGRON ER (MYRBETRIQ) 50 MG TB24 TABLET    One daily to assist and bladder control   OMEPRAZOLE (PRILOSEC) 20 MG CAPSULE    Take 20 mg by mouth daily.    PREDNISONE (DELTASONE) 10 MG TABLET    Take 60 mg tablet and taper down by 10 mg daily until completed   PREDNISONE (DELTASONE) 5 MG TABLET       PROMETHAZINE (PHENERGAN) 25 MG TABLET    Take 25 mg by mouth every 4 (four) hours as needed for nausea or vomiting.   SACCHAROMYCES BOULARDII (FLORASTOR) 250 MG CAPSULE    Take 250 mg by mouth 2 (two) times daily.   SPIRONOLACTONE (ALDACTONE) 25 MG TABLET      Modified Medications   No medications on file  Discontinued Medications   No medications on file    Physical Exam: Filed Vitals:   12/15/14 1318  BP: 154/84  Pulse: 90  Temp: 99.7 F (37.6 C)  TempSrc: Tympanic  Resp: 18   There is no weight on file to calculate  BMI.  Physical Exam  Constitutional: She is oriented to person, place, and time. She appears well-developed and well-nourished. No distress.  HENT:  Head: Normocephalic and atraumatic.  Right Ear: External ear normal.  Left Ear: External ear normal.  Nose: Nose normal.  Eyes: Conjunctivae and EOM are normal. Pupils are equal, round, and reactive to light.  Neck: Neck supple. No JVD present. No tracheal deviation present. No thyromegaly present.  Cardiovascular: Normal rate, regular rhythm, normal heart sounds and intact distal pulses.  Exam reveals no gallop and no friction rub.   No murmur heard. Pulmonary/Chest: No respiratory distress. She has no wheezes. She has rales.  Moist rales posterior mid to lower lungs  Abdominal: Bowel sounds are normal. She exhibits no distension and no mass. There is no tenderness.  Musculoskeletal: She exhibits edema (2-3+ bipedal) and tenderness.  Scars from bilateral TKR. Right knee seems to be loose when stressing the joint. No effusion. Instable gait. Driving a power scooter today. Trace edema BLE Chronic R knee pain  Lymphadenopathy:    She has no cervical adenopathy.  Neurological: She is alert and oriented to person, place, and time. She has normal reflexes. No cranial nerve deficit. Coordination normal.  Diminished vibratory and monofilament testing in both feet.  Skin: No rash (Reddened rash on both legs. Some itching and burning associated with the rash.) noted. No erythema. No pallor.  Prior exam: 6 mm nodule of the left posterior neck at the SCM muscle seems resolved Likely SCC left leg medial to shin.  Psychiatric: She has a normal mood and affect. Her behavior is normal. Judgment and thought content normal.    Labs reviewed: Basic Metabolic Panel:  Recent Labs  11/27/14 0540 11/28/14 0550 11/29/14 0615 11/30/14 12/07/14  NA 128* 128* 129* 132* 131*  K 5.3* 5.0 4.8 4.9  5.1  CL 91* 90* 92*  --   --   CO2 29 30 29   --   --    GLUCOSE 188* 277* 216*  --   --   BUN 31* 35* 27* 26* 26*  CREATININE 0.76 0.87 0.78 0.7 0.9  CALCIUM 8.7* 8.6* 8.9  --   --     Liver Function Tests:  Recent Labs  11/22/14 11/24/14 1013 12/07/14  AST 16 23 12*  ALT 17 24 11   ALKPHOS 103 102 77  BILITOT  --  0.5  --   PROT  --  6.3*  --   ALBUMIN  --  2.8*  --     CBC:  Recent Labs  11/24/14 1013  11/27/14 0812 11/28/14 0550 11/29/14 0615 11/30/14 12/07/14  WBC 17.9*  < > 21.1* 17.4* 16.6* 14.5 13.7  NEUTROABS 14.8*  --   --   --   --   --   --   HGB 11.8*  < > 13.2 12.6 13.0 12.1 10.4*  HCT 35.1*  < > 38.7 37.7 39.2 35* 31*  MCV 90.7  < > 90.4 91.1 91.0  --   --   PLT 263  < > 274 251 245 201 198  < > = values in this interval not displayed.  Lab Results  Component Value Date   TSH 0.58 11/22/2014   Lab Results  Component Value Date   HGBA1C 7.6* 11/22/2014   Lab Results  Component Value Date   CHOL 108 10/27/2013   HDL 54 10/27/2013   LDLCALC 33 10/27/2013   TRIG 103 10/27/2013    Significant Diagnostic Results since last visit: none  Patient Care Team: Estill Dooms, MD as PCP - General (Internal Medicine) Clent Jacks, MD (Ophthalmology) Adrian Prows, MD as Attending Physician (Cardiology) [facility] [name], MD as Consulting Physician (Orthopedic Surgery) Kathee Delton, MD as Consulting Physician (Pulmonary Disease)  Assessment/Plan Problem List Items Addressed This Visit    COPD (chronic obstructive pulmonary disease) ([place]) - Primary (Chronic)    Continue O2 [place], change Neb to prn, continue prednisone 10mg , Diabetic Tussin prn, CXR to evaluate staff reported productive cough, T99.7 on 12/15/14.       Hypertension (Chronic)    - controlled, continue Cardura 8mg  daily, Metoprolol 25mg  bid, Losartan 50mg  and prn Clonidine 0.1mg  fro Bp>160/100 - On lasix 20mg  and off Spironolactone 25mg  daily, minimal BLE edema, stable. 12/07/14 Na 131, K 5.1, update BMP/BNP - Continue statin        Type 2 diabetes with decreased circulation (HCC) (Chronic)    continue Lantus 10u qd, Humalog 5u with meals, total 8u if CBG>150      Edema (Chronic)    Minimal edema, continue Furosemide 20mg  daily and off Spironolactone 25mg  daily. Update BMP/BNP      Esophageal reflux    Resolved nausea and vomiting, denied abd pain or indigestion, continue Omeprazole 20mg  daily       Hyponatremia    Last Na 131 12/07/14, off Spironolactone, continued Furosemide, update BMP      Constipation    Change MiraLax to prn daily.       Anemia of chronic disease    12/07/14 Hgb 10.4, MCV 89.3, MCH 30.1, update CBC           Family/ staff Communication: monitor for respiratory symptoms.   Labs/tests ordered: CXR, CBC, BMP, BNP  ManXie Mast NP [facility]  [address], [place] [address] On Call:  [phone] & follow prompts after 5pm & weekends Office Phone:  [phone] Office Fax:  [phone]

## 2014-12-18 NOTE — Assessment & Plan Note (Signed)
Minimal edema, continue Furosemide 20mg  daily and off Spironolactone 25mg  daily. Update BMP/BNP

## 2014-12-18 NOTE — Assessment & Plan Note (Signed)
Change MiraLax to prn daily.

## 2014-12-18 NOTE — Assessment & Plan Note (Signed)
Resolved nausea and vomiting, denied abd pain or indigestion, continue Omeprazole 20mg daily  

## 2014-12-18 NOTE — Assessment & Plan Note (Signed)
-   controlled, continue Cardura 8mg  daily, Metoprolol 25mg  bid, Losartan 50mg  and prn Clonidine 0.1mg  fro Bp>160/100 - On lasix 20mg  and off Spironolactone 25mg  daily, minimal BLE edema, stable. 12/07/14 Na 131, K 5.1, update BMP/BNP - Continue statin

## 2014-12-18 NOTE — Assessment & Plan Note (Signed)
12/07/14 Hgb 10.4, MCV 89.3, MCH 30.1, update CBC

## 2014-12-18 NOTE — Assessment & Plan Note (Signed)
Last Na 131 12/07/14, off Spironolactone, continued Furosemide, update BMP

## 2014-12-18 NOTE — Assessment & Plan Note (Signed)
continue Lantus 10u qd, Humalog 5u with meals, total 8u if CBG>150

## 2014-12-18 NOTE — Assessment & Plan Note (Signed)
Continue O2 [place], change Neb to prn, continue prednisone 10mg , Diabetic Tussin prn, CXR to evaluate staff reported productive cough, T99.7 on 12/15/14.

## 2014-12-21 LAB — CBC AND DIFFERENTIAL
HCT: 30 % — AB (ref 36–46)
Hemoglobin: 9.6 g/dL — AB (ref 12.0–16.0)
PLATELETS: 296 10*3/uL (ref 150–399)
WBC: 7.9 10*3/mL

## 2014-12-21 LAB — BASIC METABOLIC PANEL
BUN: 11 mg/dL (ref 4–21)
CREATININE: 0.5 mg/dL (ref <=1.1)
Glucose: 168 mg/dL
Potassium: 4.4 mmol/L (ref 3.4–5.3)
Sodium: 137 mmol/L (ref 137–147)

## 2014-12-22 ENCOUNTER — Other Ambulatory Visit: Payer: Self-pay | Admitting: Nurse Practitioner

## 2014-12-29 LAB — CBC AND DIFFERENTIAL
HCT: 29 % — AB (ref 36–46)
Hemoglobin: 9.2 g/dL — AB (ref 12.0–16.0)
Platelets: 279 10*3/uL (ref 150–399)
WBC: 8.5 10*3/mL

## 2014-12-29 LAB — BASIC METABOLIC PANEL
BUN: 19 mg/dL (ref 4–21)
Creatinine: 0.7 mg/dL (ref 0.5–1.1)
GLUCOSE: 142 mg/dL
POTASSIUM: 4.3 mmol/L (ref 3.4–5.3)
Sodium: 136 mmol/L — AB (ref 137–147)

## 2015-01-02 ENCOUNTER — Encounter: Payer: Self-pay | Admitting: Nurse Practitioner

## 2015-01-02 ENCOUNTER — Non-Acute Institutional Stay (SKILLED_NURSING_FACILITY): Payer: Medicare Other | Admitting: Nurse Practitioner

## 2015-01-02 DIAGNOSIS — D638 Anemia in other chronic diseases classified elsewhere: Secondary | ICD-10-CM | POA: Diagnosis not present

## 2015-01-02 DIAGNOSIS — R609 Edema, unspecified: Secondary | ICD-10-CM

## 2015-01-02 DIAGNOSIS — J410 Simple chronic bronchitis: Secondary | ICD-10-CM

## 2015-01-02 DIAGNOSIS — N393 Stress incontinence (female) (male): Secondary | ICD-10-CM | POA: Diagnosis not present

## 2015-01-02 DIAGNOSIS — I10 Essential (primary) hypertension: Secondary | ICD-10-CM

## 2015-01-02 DIAGNOSIS — E871 Hypo-osmolality and hyponatremia: Secondary | ICD-10-CM | POA: Diagnosis not present

## 2015-01-02 DIAGNOSIS — K219 Gastro-esophageal reflux disease without esophagitis: Secondary | ICD-10-CM | POA: Diagnosis not present

## 2015-01-02 DIAGNOSIS — I5042 Chronic combined systolic (congestive) and diastolic (congestive) heart failure: Secondary | ICD-10-CM

## 2015-01-02 DIAGNOSIS — E1151 Type 2 diabetes mellitus with diabetic peripheral angiopathy without gangrene: Secondary | ICD-10-CM | POA: Diagnosis not present

## 2015-01-02 NOTE — Assessment & Plan Note (Signed)
Continue Myrbetriq 50mg daily, no urinary retention.  

## 2015-01-02 NOTE — Progress Notes (Signed)
Patient ID: [name], female   DOB: 11-25-1917, 81 y.o.   MRN: [id number]  Location:  SNF [facility] Provider:  Marlana Latus NP  Code Status:  DNR Goals of care: Advanced Directive information    Chief Complaint  Patient presents with  . Medical Management of Chronic Issues     HPI: Patient is a 81 y.o. female seen in the SNF at [facility] today for evaluation of BLE edema,  chronic Bronchiectasis with acute exacerbation ([name]) HCAP (healthcare-associated pneumonia). Blood sugar is managed with Lantus 10u, Humalog 8 u if CBG>150. Not constipation while on MiraLax prn.   Review of Systems  Constitutional: Negative for fever, chills and diaphoresis.       Baseline mobility is motorized scooter  HENT: Positive for hearing loss. Negative for congestion, ear discharge and ear pain.   Eyes: Negative for photophobia and pain.  Respiratory: Positive for cough and shortness of breath. Negative for sputum production and wheezing.   Cardiovascular: Positive for leg swelling. Negative for chest pain and palpitations.       2+,  Right knee pain.   Gastrointestinal: Positive for constipation. Negative for nausea, vomiting, abdominal pain, diarrhea and melena.  Genitourinary: Positive for frequency. Negative for dysuria and urgency.       Recurrent urinary tract infections. Nocturia. Urinary frequency.  Musculoskeletal: Negative for myalgias, back pain and neck pain.       R knee pain  Skin: Negative for rash.       Healing wound of the left hand dorsum near the thumb where a lesion was removed by Dr. Redmond Pulling at the [facility].  Neurological: Negative for dizziness, tremors, seizures, weakness and headaches.  Endo/Heme/Allergies: Negative for environmental allergies.  Psychiatric/Behavioral: Negative for suicidal ideas and hallucinations. The patient is not nervous/anxious.     Past Medical History  Diagnosis Date  . Type II or unspecified type diabetes mellitus  without mention of complication, not stated as uncontrolled   . Hypertension   . Arthritis   . COPD (chronic obstructive pulmonary disease) ([place])   . Regional enteritis of unspecified site   . Other malaise and fatigue   . Anemia, unspecified   . Other and unspecified hyperlipidemia   . Hypoxemia   . Pneumonia, organism unspecified   . Mucopurulent chronic bronchitis ([place])   . Palpitations   . Nontoxic uninodular goiter   . Abnormality of gait   . Fall from other slipping, tripping, or stumbling   . Cholelithiases   . Closed fracture of unspecified part of ramus of mandible   . Disturbance of skin sensation   . Sciatica   . Tension headache   . Coronary atherosclerosis of unspecified type of vessel, native or graft   . Osteoarthrosis, unspecified whether generalized or localized, unspecified site   . Insomnia, unspecified   . Edema   . Tension headache   . Pain in joint, lower leg 2010    right knee  . Unspecified venous (peripheral) insufficiency   . Esophageal reflux   . Rosacea   . Cataract 1990    Dr. Katy Fitch  . Pain in joint, shoulder region 2013    left  . Gastric ulcer with hemorrhage 2008  . Urine, incontinence, stress female 07/19/2013  . Bronchiectasis without acute exacerbation ([place]) 04/10/2011    CT chest 2011   . DOE (dyspnea on exertion) 04/10/2011    PFTs 2013:  FEV1 1.04 (78%), ratio 64, couldn't do maneuver for  lung volumes or dlco   . Nodule of neck 11/07/2013    Left neck posteriorly   . Squamous cell carcinoma of skin of lower limb, including hip 07/27/2012    Nonhealing lesion of the left mid calf medially   . Tachycardia 12/06/2014    Patient Active Problem List   Diagnosis Date Noted  . Chronic combined systolic and diastolic congestive heart failure ([name]) 12/08/2014  . Anemia of chronic disease 12/08/2014  . Tachycardia 12/06/2014  . Constipation 12/05/2014  . Hyponatremia 11/24/2014  . Bronchiectasis with acute exacerbation ([name]) 11/24/2014  .  HCAP (healthcare-associated pneumonia) 11/24/2014  . Acute on chronic respiratory failure ([name]) 11/24/2014  . Infection of urinary tract 06/23/2014  . Weakness 05/22/2014  . Insomnia 11/21/2013  . Nodule of neck 11/07/2013  . Palpitations 09/13/2013  . Nocturia 09/13/2013  . Hyperlipidemia 07/19/2013  . Urine, incontinence, stress female 07/19/2013  . Type 2 diabetes with decreased circulation ([name]) 05/24/2013  . Edema 05/24/2013  . Esophageal reflux   . Pain in joint, shoulder region 07/27/2012  . Hypertension   . Arthritis   . Other malaise and fatigue   . Mucopurulent chronic bronchitis ([name])   . Pain in joint, lower leg   . COPD (chronic obstructive pulmonary disease) ([name]) 02/06/2012  . DOE (dyspnea on exertion) 04/10/2011  . Bronchiectasis without acute exacerbation ([name]) 04/10/2011  . Pulmonary nodules 04/10/2011    Allergies  Allergen Reactions  . Adhesive [Tape]     unknown  . Aspirin     unknown  . Codeine     unknown  . Enalapril Cough  . Hydrocodone     unknown  . Pantoprazole Sodium     unknown    Medications: Patient's Medications  New Prescriptions   No medications on file  Previous Medications   ACETAMINOPHEN (TYLENOL) 325 MG TABLET    Take 650 mg by mouth every 6 (six) hours as needed for moderate pain or headache.   ACETAMINOPHEN (TYLENOL) 500 MG TABLET    Take 500 mg by mouth 2 (two) times daily.    ALBUTEROL (PROVENTIL) (2.5 MG/3ML) 0.083% NEBULIZER SOLUTION    Take 3 mLs (2.5 mg total) by nebulization 3 (three) times daily.   ATORVASTATIN (LIPITOR) 10 MG TABLET    Take 10 mg by mouth daily.   CALCIUM CARBONATE ANTACID (CALCIUM ANTACID PO)    Take 600 mg by mouth 3 (three) times daily as needed (heartburn).   CARVEDILOL (COREG) 6.25 MG TABLET    Take 6.25 mg by mouth 2 (two) times daily with a meal.   CLONIDINE (CATAPRES) 0.1 MG TABLET    Take 0.1 mg by mouth. Take one tablet as needed for bp > 160/100   DOXAZOSIN (CARDURA) 8 MG TABLET    Take 8  mg by mouth daily. To control BP.   FUROSEMIDE (LASIX) 20 MG TABLET    Take 20 mg by mouth daily.   GLUCOSE BLOOD TEST STRIP    Use three time daily to test blood sugar. Contour Test Strips. Dx:E11.9   GUAIFENESIN (ROBITUSSIN) 100 MG/5ML LIQUID    Take 200 mg by mouth. Take 10 ml every 6 hours as needed for cough   INSULIN GLARGINE (LANTUS) 100 UNIT/ML INJECTION    Inject 10 Units into the skin at bedtime.    INSULIN LISPRO (HUMALOG) 100 UNIT/ML CARTRIDGE    Inject 5-8 Units into the skin 3 (three) times daily before meals. Scheduled 5 units TID if blood sugar level is  about 150 give an additional 3 units= 8units   LEVOFLOXACIN (LEVAQUIN) 500 MG TABLET    Take 1 tablet (500 mg total) by mouth daily.   LOSARTAN (COZAAR) 50 MG TABLET    Take 50 mg by mouth daily.   MIRABEGRON ER (MYRBETRIQ) 50 MG TB24 TABLET    One daily to assist and bladder control   OMEPRAZOLE (PRILOSEC) 20 MG CAPSULE    Take 20 mg by mouth daily.    PREDNISONE (DELTASONE) 10 MG TABLET    Take 60 mg tablet and taper down by 10 mg daily until completed   PREDNISONE (DELTASONE) 5 MG TABLET       PROMETHAZINE (PHENERGAN) 25 MG TABLET    Take 25 mg by mouth every 4 (four) hours as needed for nausea or vomiting.   SACCHAROMYCES BOULARDII (FLORASTOR) 250 MG CAPSULE    Take 250 mg by mouth 2 (two) times daily.   SPIRONOLACTONE (ALDACTONE) 25 MG TABLET      Modified Medications   No medications on file  Discontinued Medications   No medications on file    Physical Exam: Filed Vitals:   01/02/15 1417  BP: 127/57  Pulse: 56  Temp: 97.5 F (36.4 C)  TempSrc: Tympanic  Resp: 20   There is no weight on file to calculate BMI.  Physical Exam  Constitutional: She is oriented to person, place, and time. She appears well-developed and well-nourished. No distress.  HENT:  Head: Normocephalic and atraumatic.  Right Ear: External ear normal.  Left Ear: External ear normal.  Nose: Nose normal.  Eyes: Conjunctivae and EOM are  normal. Pupils are equal, round, and reactive to light.  Neck: Neck supple. No JVD present. No tracheal deviation present. No thyromegaly present.  Cardiovascular: Normal rate, regular rhythm, normal heart sounds and intact distal pulses.  Exam reveals no gallop and no friction rub.   No murmur heard. Pulmonary/Chest: No respiratory distress. She has no wheezes. She has rales.  Moist rales posterior mid to lower lungs  Abdominal: Bowel sounds are normal. She exhibits no distension and no mass. There is no tenderness.  Musculoskeletal: She exhibits edema (2-3+ bipedal) and tenderness.  Scars from bilateral TKR. Right knee seems to be loose when stressing the joint. No effusion. Instable gait. Driving a power scooter 2+ edema BLE Chronic R knee pain  Lymphadenopathy:    She has no cervical adenopathy.  Neurological: She is alert and oriented to person, place, and time. She has normal reflexes. No cranial nerve deficit. Coordination normal.  Diminished vibratory and monofilament testing in both feet.  Skin: No rash (Reddened rash on both legs. Some itching and burning associated with the rash.) noted. No erythema. No pallor.  Prior exam: 6 mm nodule of the left posterior neck at the SCM muscle seems resolved Likely SCC left leg medial to shin.  Psychiatric: She has a normal mood and affect. Her behavior is normal. Judgment and thought content normal.    Labs reviewed: Basic Metabolic Panel:  Recent Labs  11/27/14 0540 11/28/14 0550 11/29/14 0615  12/07/14 12/21/14 12/29/14  NA 128* 128* 129*  < > 131* 137 136*  K 5.3* 5.0 4.8  < > 5.1 4.4 4.3  CL 91* 90* 92*  --   --   --   --   CO2 29 30 29   --   --   --   --   GLUCOSE 188* 277* 216*  --   --   --   --  BUN 31* 35* 27*  < > 26* 11 19  CREATININE 0.76 0.87 0.78  < > 0.9 0.5 0.7  CALCIUM 8.7* 8.6* 8.9  --   --   --   --   < > = values in this interval not displayed.  Liver Function Tests:  Recent Labs  11/22/14 11/24/14 1013  12/07/14  AST 16 23 12*  ALT 17 24 11   ALKPHOS 103 102 77  BILITOT  --  0.5  --   PROT  --  6.3*  --   ALBUMIN  --  2.8*  --     CBC:  Recent Labs  11/24/14 1013  11/27/14 0812 11/28/14 0550 11/29/14 0615  12/07/14 12/21/14 12/29/14  WBC 17.9*  < > 21.1* 17.4* 16.6*  < > 13.7 7.9 8.5  NEUTROABS 14.8*  --   --   --   --   --   --   --   --   HGB 11.8*  < > 13.2 12.6 13.0  < > 10.4* 9.6* 9.2*  HCT 35.1*  < > 38.7 37.7 39.2  < > 31* 30* 29*  MCV 90.7  < > 90.4 91.1 91.0  --   --   --   --   PLT 263  < > 274 251 245  < > 198 296 279  < > = values in this interval not displayed.  Lab Results  Component Value Date   TSH 0.58 11/22/2014   Lab Results  Component Value Date   HGBA1C 7.6* 11/22/2014   Lab Results  Component Value Date   CHOL 108 10/27/2013   HDL 54 10/27/2013   LDLCALC 33 10/27/2013   TRIG 103 10/27/2013    Significant Diagnostic Results since last visit: none  Patient Care Team: Estill Dooms, MD as PCP - General (Internal Medicine) Clent Jacks, MD (Ophthalmology) Adrian Prows, MD as Attending Physician (Cardiology) [facility] [name], MD as Consulting Physician (Orthopedic Surgery) Kathee Delton, MD as Consulting Physician (Pulmonary Disease)  Assessment/Plan Problem List Items Addressed This Visit    COPD (chronic obstructive pulmonary disease) ([place]) (Chronic)    Continue O2 [place], change Neb to prn, continue prednisone 10mg , Diabetic Tussin prn, 12/16/14 CXR cardiomegaly and chronic appearing changes, no evidence of acute pulmonary pathology, no significant interval change.       Hypertension (Chronic)    - controlled, continue Cardura 8mg  daily, Metoprolol 25mg  bid, Losartan 50mg  and prn Clonidine 0.1mg  fro Bp>160/100 - On lasix 20mg  and off Spironolactone 25mg  daily, 2+BLE edema - Continue statin       Type 2 diabetes with decreased circulation (HCC) (Chronic)    continue Lantus 10u qd, Humalog 5u with meals, total 8u if CBG>150        Edema - Primary (Chronic)    2+ edema BLE, continue Furosemide 20mg  daily and off Spironolactone 25mg  daily. BNP 123 12/29/14, weight weekly. Monitor for s/s of developing CHF      Urine, incontinence, stress female (Chronic)    Continue Myrbetriq 50mg  daily, no urinary retention.       Esophageal reflux    Resolved nausea and vomiting, denied abd pain or indigestion, continue Omeprazole 20mg  daily       Hyponatremia    11/22/14 Na 121 11/30/14 Na 132 12/07/14 Na 131 12/21/14 Na 137 12/29/14 Na 136      Chronic combined systolic and diastolic congestive heart failure ([place])    12/07/14 BNP 188, Na  131 12/21/14 BNP198.1 Na 137 12/29/14 BNP 123 Continue Furosemide 20mg  daily, weight weekly, monitor for s/s of developing CHF      Anemia of chronic disease    12/07/14 Hgb 10.4, MCV 89.3, MCH 30.1 12/21/14 Hgb 9.6 12/29/14 Hgb 9.2          Family/ staff Communication: monitor for respiratory symptoms.   Labs/tests ordered: weekly weight  ManXie Hadi Dubin NP [facility] [address], [place] [address] On Call:  [phone] & follow prompts after 5pm & weekends Office Phone:  [phone] Office Fax:  [phone]

## 2015-01-02 NOTE — Assessment & Plan Note (Signed)
-   controlled, continue Cardura 8mg  daily, Metoprolol 25mg  bid, Losartan 50mg  and prn Clonidine 0.1mg  fro Bp>160/100 - On lasix 20mg  and off Spironolactone 25mg  daily, 2+BLE edema - Continue statin

## 2015-01-02 NOTE — Assessment & Plan Note (Signed)
11/22/14 Na 121 11/30/14 Na 132 12/07/14 Na 131 12/21/14 Na 137 12/29/14 Na 136

## 2015-01-02 NOTE — Assessment & Plan Note (Signed)
Continue O2 [place], change Neb to prn, continue prednisone 10mg , Diabetic Tussin prn, 12/16/14 CXR cardiomegaly and chronic appearing changes, no evidence of acute pulmonary pathology, no significant interval change.

## 2015-01-02 NOTE — Assessment & Plan Note (Signed)
12/07/14 BNP 188, Na 131 12/21/14 BNP198.1 Na 137 12/29/14 BNP 123 Continue Furosemide 20mg  daily, weight weekly, monitor for s/s of developing CHF

## 2015-01-02 NOTE — Assessment & Plan Note (Signed)
12/07/14 Hgb 10.4, MCV 89.3, MCH 30.1 12/21/14 Hgb 9.6 12/29/14 Hgb 9.2

## 2015-01-02 NOTE — Assessment & Plan Note (Signed)
2+ edema BLE, continue Furosemide 20mg  daily and off Spironolactone 25mg  daily. BNP 123 12/29/14, weight weekly. Monitor for s/s of developing CHF

## 2015-01-02 NOTE — Assessment & Plan Note (Signed)
Resolved nausea and vomiting, denied abd pain or indigestion, continue Omeprazole 20mg daily  

## 2015-01-02 NOTE — Assessment & Plan Note (Signed)
continue Lantus 10u qd, Humalog 5u with meals, total 8u if CBG>150

## 2015-01-04 ENCOUNTER — Non-Acute Institutional Stay (SKILLED_NURSING_FACILITY): Payer: Medicare Other | Admitting: Internal Medicine

## 2015-01-04 DIAGNOSIS — D638 Anemia in other chronic diseases classified elsewhere: Secondary | ICD-10-CM

## 2015-01-04 DIAGNOSIS — I10 Essential (primary) hypertension: Secondary | ICD-10-CM | POA: Diagnosis not present

## 2015-01-04 DIAGNOSIS — R609 Edema, unspecified: Secondary | ICD-10-CM | POA: Diagnosis not present

## 2015-01-04 DIAGNOSIS — J410 Simple chronic bronchitis: Secondary | ICD-10-CM

## 2015-01-04 NOTE — Progress Notes (Signed)
Patient ID: [name], female   DOB: Nov 20, 1917, 81 y.o.   MRN: [id number]    Facility   [facility] Room Number: N 16  Place of Service: SNF (31)     Allergies  Allergen Reactions  . Adhesive [Tape]     unknown  . Aspirin     unknown  . Codeine     unknown  . Enalapril Cough  . Hydrocodone     unknown  . Pantoprazole Sodium     unknown    Chief Complaint  Patient presents with  . Medical Management of Chronic Issues    HPI:  Simple chronic bronchitis (HCC) - cough has improved. Breathing is improved.  Edema, unspecified type - is not wearing her compression stockings today. Edema is approximately 2+ bilaterally.  Essential hypertension - controlled  Anemia of chronic disease - slight fallen last hemoglobin down to 9.2 previous value was 9.6.    Medications: Patient's Medications  New Prescriptions   No medications on file  Previous Medications   ACETAMINOPHEN (TYLENOL) 325 MG TABLET    Take 650 mg by mouth every 6 (six) hours as needed for moderate pain or headache.   ACETAMINOPHEN (TYLENOL) 500 MG TABLET    Take 500 mg by mouth 2 (two) times daily.    ATORVASTATIN (LIPITOR) 10 MG TABLET    Take 10 mg by mouth daily.   BUDESONIDE (PULMICORT) 1 MG/2ML NEBULIZER SOLUTION    Take 1 mg by nebulization 2 (two) times daily. Inhale every 12 hours if needed for dyspnea   CALCIUM CARBONATE ANTACID (CALCIUM ANTACID PO)    Take 600 mg by mouth 3 (three) times daily as needed (heartburn).   CLONIDINE (CATAPRES) 0.1 MG TABLET    Take 0.1 mg by mouth. Take one tablet as needed for bp > 160/100   DOXAZOSIN (CARDURA) 8 MG TABLET    Take 8 mg by mouth daily. To control BP.   FUROSEMIDE (LASIX) 20 MG TABLET    Take 20 mg by mouth daily.   GLUCOSE BLOOD TEST STRIP    Use three time daily to test blood sugar. Contour Test Strips. Dx:E11.9   GUAIFENESIN (ROBITUSSIN) 100 MG/5ML LIQUID    Take 200 mg by mouth. Take 10 ml every 6 hours as needed for cough   INSULIN GLARGINE  (LANTUS) 100 UNIT/ML INJECTION    Inject 10 Units into the skin at bedtime.    INSULIN LISPRO (HUMALOG) 100 UNIT/ML CARTRIDGE    Inject 5-8 Units into the skin 3 (three) times daily before meals. Scheduled 5 units TID if blood sugar level is about 150 give an additional 3 units= 8units   IPRATROPIUM-ALBUTEROL (DUONEB) 0.5-2.5 (3) MG/3ML SOLN    Take 3 mLs by nebulization 2 (two) times daily. Inhale every 12 hours as needed for dyspnea   LOSARTAN (COZAAR) 50 MG TABLET    Take 50 mg by mouth daily.   OMEPRAZOLE (PRILOSEC) 20 MG CAPSULE    Take 20 mg by mouth daily.    PROMETHAZINE (PHENERGAN) 25 MG TABLET    Take 25 mg by mouth every 4 (four) hours as needed for nausea or vomiting.   SPIRONOLACTONE (ALDACTONE) 25 MG TABLET      Modified Medications   No medications on file  Discontinued Medications   ALBUTEROL (PROVENTIL) (2.5 MG/3ML) 0.083% NEBULIZER SOLUTION    Take 3 mLs (2.5 mg total) by nebulization 3 (three) times daily.   CARVEDILOL (COREG) 6.25 MG TABLET    Take  6.25 mg by mouth 2 (two) times daily with a meal.   LEVOFLOXACIN (LEVAQUIN) 500 MG TABLET    Take 1 tablet (500 mg total) by mouth daily.   MIRABEGRON ER (MYRBETRIQ) 50 MG TB24 TABLET    One daily to assist and bladder control   PREDNISONE (DELTASONE) 10 MG TABLET    Take 60 mg tablet and taper down by 10 mg daily until completed   PREDNISONE (DELTASONE) 5 MG TABLET       SACCHAROMYCES BOULARDII (FLORASTOR) 250 MG CAPSULE    Take 250 mg by mouth 2 (two) times daily.     Review of Systems  Constitutional: Negative for fever, chills and diaphoresis.       Baseline mobility is motorized scooter  HENT: Positive for hearing loss. Negative for congestion, ear discharge, ear pain, nosebleeds, sore throat and tinnitus.   Eyes: Negative for photophobia and pain.  Respiratory: Positive for cough, shortness of breath and wheezing. Negative for stridor.        Dyspneic at rest with oxygen dependency.  Cardiovascular: Positive for leg  swelling. Negative for chest pain and palpitations.       Trace. Right knee pain.   Gastrointestinal: Negative for nausea, vomiting, abdominal pain, diarrhea, constipation and blood in stool.  Endocrine: Negative for polydipsia.  Genitourinary: Positive for frequency. Negative for dysuria, urgency, hematuria and flank pain.       Recurrent urinary tract infections.  Incontinent of urine  Nocturia. Urinary frequency.  Musculoskeletal: Negative for myalgias, back pain and neck pain.       R knee pain  Skin: Negative.  Negative for rash.  Allergic/Immunologic: Negative for environmental allergies.  Neurological: Positive for weakness. Negative for dizziness, tremors, seizures and headaches.  Hematological: Does not bruise/bleed easily.  Psychiatric/Behavioral: Negative for suicidal ideas and hallucinations. The patient is not nervous/anxious.     Filed Vitals:   01/04/15 1316  BP: 138/60  Pulse: 85  Temp: 97.1 F (36.2 C)  Resp: 18  Height: _0  (1.626 m)  Weight: 185 lb (83.915 kg)  SpO2: 94%   Body mass index is 31.74 kg/(m^2).  Physical Exam  Constitutional: She is oriented to person, place, and time. She appears well-developed and well-nourished. No distress.  HENT:  Head: Normocephalic and atraumatic.  Right Ear: External ear normal.  Left Ear: External ear normal.  Nose: Nose normal.  Eyes: Conjunctivae and EOM are normal. Pupils are equal, round, and reactive to light.  Neck: Neck supple. No JVD present. No tracheal deviation present. No thyromegaly present.  Cardiovascular: Normal rate, regular rhythm, normal heart sounds and intact distal pulses.  Exam reveals no gallop and no friction rub.   No murmur heard. Pulmonary/Chest: No respiratory distress. She has wheezes. She has rales.  Oxygen dependent at 2 L/m  Abdominal: Bowel sounds are normal. She exhibits no distension and no mass. There is no tenderness.  Musculoskeletal: She exhibits edema (2-3+ bipedal) and  tenderness.  Scars from bilateral TKR. Right knee seems to be loose when stressing the joint. No effusion. Instable gait. Driving a power scooter today. Trace edema BLE Chronic R knee pain  Lymphadenopathy:    She has no cervical adenopathy.  Neurological: She is alert and oriented to person, place, and time. She has normal reflexes. No cranial nerve deficit. Coordination normal.  Diminished vibratory and monofilament testing in both feet.  Skin: No rash (Reddened rash on both legs. Some itching and burning associated with the rash.) noted. No erythema.  No pallor.  Prior exam: 6 mm nodule of the left posterior neck at the SCM muscle seems resolved Likely SCC left leg medial to shin.  Psychiatric: She has a normal mood and affect. Her behavior is normal. Judgment and thought content normal.     Labs reviewed: Lab Summary Latest Ref Rng 12/29/2014 12/21/2014 12/07/2014 11/30/2014  Hemoglobin 12.0 - 16.0 g/dL 9.2(A) 9.6(A) 10.4(A) 12.1  Hematocrit 36 - 46 % 29(A) 30(A) 31(A) 35(A)  White count - 8.5 7.9 13.7 14.5  Platelet count 150 - 399 K/L 279 296 198 201  Sodium 137 - 147 mmol/L 136(A) 137 131(A) 132(A)  Potassium 3.4 - 5.3 mmol/L 4.3 4.4 5.1 4.9  Calcium - (None) (None) (None) (None)  Phosphorus - (None) (None) (None) (None)  Creatinine 0.5 - 1.1 mg/dL 0.7 0.5 0.9 0.7  AST 13 - 35 U/L (None) (None) 12(A) (None)  Alk Phos 25 - 125 U/L (None) (None) 77 (None)  Bilirubin - (None) (None) (None) (None)  Glucose - 142 168 139 246  Cholesterol - (None) (None) (None) (None)  HDL cholesterol - (None) (None) (None) (None)  Triglycerides - (None) (None) (None) (None)  LDL Direct - (None) (None) (None) (None)  LDL Calc - (None) (None) (None) (None)  Total protein - (None) (None) (None) (None)  Albumin - (None) (None) (None) (None)   Lab Results  Component Value Date   TSH 0.58 11/22/2014   Lab Results  Component Value Date   BUN 19 12/29/2014   Lab Results  Component Value Date    HGBA1C 7.6* 11/22/2014       Assessment/Plan  1. Simple chronic bronchitis ([place]) Continues with O2 dependency, but is otherwise improved  2. Edema, unspecified type Remains on furosemide. Strongly recommended continuing compression stockings daily.  3. Essential hypertension Controlled  4. Anemia of chronic disease Continue routine monitoring with follow-up CBC next month.

## 2015-01-05 ENCOUNTER — Non-Acute Institutional Stay (SKILLED_NURSING_FACILITY): Payer: Medicare Other | Admitting: Nurse Practitioner

## 2015-01-05 ENCOUNTER — Encounter: Payer: Self-pay | Admitting: Nurse Practitioner

## 2015-01-05 DIAGNOSIS — I10 Essential (primary) hypertension: Secondary | ICD-10-CM

## 2015-01-05 DIAGNOSIS — I5042 Chronic combined systolic (congestive) and diastolic (congestive) heart failure: Secondary | ICD-10-CM | POA: Diagnosis not present

## 2015-01-05 DIAGNOSIS — R609 Edema, unspecified: Secondary | ICD-10-CM | POA: Diagnosis not present

## 2015-01-05 DIAGNOSIS — E1151 Type 2 diabetes mellitus with diabetic peripheral angiopathy without gangrene: Secondary | ICD-10-CM | POA: Diagnosis not present

## 2015-01-05 DIAGNOSIS — K219 Gastro-esophageal reflux disease without esophagitis: Secondary | ICD-10-CM | POA: Diagnosis not present

## 2015-01-05 DIAGNOSIS — J410 Simple chronic bronchitis: Secondary | ICD-10-CM

## 2015-01-05 NOTE — Assessment & Plan Note (Addendum)
Continue O2 [place], change Neb to prn, continue prednisone5mg , Diabetic Tussin prn, 12/16/14 CXR cardiomegaly and chronic appearing changes, no evidence of acute pulmonary pathology, no significant interval change. May consider Hospice referral when she desires.

## 2015-01-05 NOTE — Progress Notes (Signed)
Patient ID: HEE KHA, female   DOB: 1917-07-28, 81 y.o.   MRN: [id number]  Location:  SNF [facility] Provider:  Marlana Latus NP  Code Status:  DNR Goals of care: Advanced Directive information    Chief Complaint  Patient presents with  . Medical Management of Chronic Issues  . Acute Visit    wheezes, cough around 5 am     HPI: Patient is a 81 y.o. female seen in the SNF at [facility] today for evaluation of BLE edema,  chronic Bronchiectasis with acute exacerbation ([place]) HCAP (healthcare-associated pneumonia), fully treated, recurred central expiratory wheezes today. Blood sugar is managed with Lantus 10u, Humalog 8 u if CBG>150. Not constipation while on MiraLax prn.   Review of Systems  Constitutional: Negative for fever, chills and diaphoresis.       Baseline mobility is motorized scooter  HENT: Positive for hearing loss. Negative for congestion, ear discharge and ear pain.   Eyes: Negative for photophobia and pain.  Respiratory: Positive for cough and shortness of breath. Negative for sputum production and wheezing.   Cardiovascular: Positive for leg swelling. Negative for chest pain and palpitations.       2+,  Right knee pain.   Gastrointestinal: Positive for constipation. Negative for nausea, vomiting, abdominal pain, diarrhea and melena.  Genitourinary: Positive for frequency. Negative for dysuria and urgency.       Recurrent urinary tract infections. Nocturia. Urinary frequency.  Musculoskeletal: Negative for myalgias, back pain and neck pain.       R knee pain  Skin: Negative for rash.       Healing wound of the left hand dorsum near the thumb where a lesion was removed by Dr. Redmond Pulling at the [place] at [facility].  Neurological: Negative for dizziness, tremors, seizures, weakness and headaches.  Endo/Heme/Allergies: Negative for environmental allergies.  Psychiatric/Behavioral: Negative for suicidal ideas and hallucinations. The patient is not  nervous/anxious.     Past Medical History  Diagnosis Date  . Type II or unspecified type diabetes mellitus without mention of complication, not stated as uncontrolled   . Hypertension   . Arthritis   . COPD (chronic obstructive pulmonary disease) ([place])   . Regional enteritis of unspecified site   . Other malaise and fatigue   . Anemia, unspecified   . Other and unspecified hyperlipidemia   . Hypoxemia   . Pneumonia, organism unspecified   . Mucopurulent chronic bronchitis ([place])   . Palpitations   . Nontoxic uninodular goiter   . Abnormality of gait   . Fall from other slipping, tripping, or stumbling   . Cholelithiases   . Closed fracture of unspecified part of ramus of mandible   . Disturbance of skin sensation   . Sciatica   . Tension headache   . Coronary atherosclerosis of unspecified type of vessel, native or graft   . Osteoarthrosis, unspecified whether generalized or localized, unspecified site   . Insomnia, unspecified   . Edema   . Tension headache   . Pain in joint, lower leg 2010    right knee  . Unspecified venous (peripheral) insufficiency   . Esophageal reflux   . Rosacea   . Cataract 1990    Dr. Katy Fitch  . Pain in joint, shoulder region 2013    left  . Gastric ulcer with hemorrhage 2008  . Urine, incontinence, stress female 07/19/2013  . Bronchiectasis without acute exacerbation ([place]) 04/10/2011    CT chest 2011   . DOE (  dyspnea on exertion) 04/10/2011    PFTs 2013:  FEV1 1.04 (78%), ratio 64, couldn't do maneuver for lung volumes or dlco   . Nodule of neck 11/07/2013    Left neck posteriorly   . Squamous cell carcinoma of skin of lower limb, including hip 07/27/2012    Nonhealing lesion of the left mid calf medially   . Tachycardia 12/06/2014    Patient Active Problem List   Diagnosis Date Noted  . Chronic combined systolic and diastolic congestive heart failure ([place]) 12/08/2014  . Anemia of chronic disease 12/08/2014  . Tachycardia 12/06/2014  .  Constipation 12/05/2014  . Hyponatremia 11/24/2014  . Infection of urinary tract 06/23/2014  . Insomnia 11/21/2013  . Nodule of neck 11/07/2013  . Palpitations 09/13/2013  . Nocturia 09/13/2013  . Hyperlipidemia 07/19/2013  . Urine, incontinence, stress female 07/19/2013  . Type 2 diabetes with decreased circulation ([place]) 05/24/2013  . Edema 05/24/2013  . Esophageal reflux   . Pain in joint, shoulder region 07/27/2012  . Hypertension   . Arthritis   . Other malaise and fatigue   . Mucopurulent chronic bronchitis ([place])   . Pain in joint, lower leg   . COPD (chronic obstructive pulmonary disease) ([place]) 02/06/2012  . DOE (dyspnea on exertion) 04/10/2011  . Bronchiectasis without acute exacerbation ([place]) 04/10/2011  . Pulmonary nodules 04/10/2011    Allergies  Allergen Reactions  . Adhesive [Tape]     unknown  . Aspirin     unknown  . Codeine     unknown  . Enalapril Cough  . Hydrocodone     unknown  . Pantoprazole Sodium     unknown    Medications: Patient's Medications  New Prescriptions   No medications on file  Previous Medications   ACETAMINOPHEN (TYLENOL) 325 MG TABLET    Take 650 mg by mouth every 6 (six) hours as needed for moderate pain or headache.   ACETAMINOPHEN (TYLENOL) 500 MG TABLET    Take 500 mg by mouth 2 (two) times daily.    ATORVASTATIN (LIPITOR) 10 MG TABLET    Take 10 mg by mouth daily.   BUDESONIDE (PULMICORT) 1 MG/2ML NEBULIZER SOLUTION    Take 1 mg by nebulization 2 (two) times daily. Inhale every 12 hours if needed for dyspnea   CALCIUM CARBONATE ANTACID (CALCIUM ANTACID PO)    Take 600 mg by mouth 3 (three) times daily as needed (heartburn).   CLONIDINE (CATAPRES) 0.1 MG TABLET    Take 0.1 mg by mouth. Take one tablet as needed for bp > 160/100   DOXAZOSIN (CARDURA) 8 MG TABLET    Take 8 mg by mouth daily. To control BP.   FUROSEMIDE (LASIX) 20 MG TABLET    Take 20 mg by mouth daily.   GLUCOSE BLOOD TEST STRIP    Use three time daily to test  blood sugar. Contour Test Strips. Dx:E11.9   GUAIFENESIN (ROBITUSSIN) 100 MG/5ML LIQUID    Take 200 mg by mouth. Take 10 ml every 6 hours as needed for cough   INSULIN GLARGINE (LANTUS) 100 UNIT/ML INJECTION    Inject 10 Units into the skin at bedtime.    INSULIN LISPRO (HUMALOG) 100 UNIT/ML CARTRIDGE    Inject 5-8 Units into the skin 3 (three) times daily before meals. Scheduled 5 units TID if blood sugar level is about 150 give an additional 3 units= 8units   IPRATROPIUM-ALBUTEROL (DUONEB) 0.5-2.5 (3) MG/3ML SOLN    Take 3 mLs by nebulization 2 (two)  times daily. Inhale every 12 hours as needed for dyspnea   LOSARTAN (COZAAR) 50 MG TABLET    Take 50 mg by mouth daily.   OMEPRAZOLE (PRILOSEC) 20 MG CAPSULE    Take 20 mg by mouth daily.    PROMETHAZINE (PHENERGAN) 25 MG TABLET    Take 25 mg by mouth every 4 (four) hours as needed for nausea or vomiting.   SPIRONOLACTONE (ALDACTONE) 25 MG TABLET      Modified Medications   No medications on file  Discontinued Medications   No medications on file    Physical Exam: Filed Vitals:   01/05/15 1138  BP: 140/76  Pulse: 80  Temp: 99.2 F (37.3 C)  TempSrc: Tympanic  Resp: 26   There is no weight on file to calculate BMI.  Physical Exam  Constitutional: She is oriented to person, place, and time. She appears well-developed and well-nourished. No distress.  HENT:  Head: Normocephalic and atraumatic.  Right Ear: External ear normal.  Left Ear: External ear normal.  Nose: Nose normal.  Eyes: Conjunctivae and EOM are normal. Pupils are equal, round, and reactive to light.  Neck: Neck supple. No JVD present. No tracheal deviation present. No thyromegaly present.  Cardiovascular: Normal rate, regular rhythm, normal heart sounds and intact distal pulses.  Exam reveals no gallop and no friction rub.   No murmur heard. Pulmonary/Chest: No respiratory distress. She has no wheezes. She has rales.  Moist rales posterior mid to lower lungs    Abdominal: Bowel sounds are normal. She exhibits no distension and no mass. There is no tenderness.  Musculoskeletal: She exhibits edema (2-3+ bipedal) and tenderness.  Scars from bilateral TKR. Right knee seems to be loose when stressing the joint. No effusion. Instable gait. Driving a power scooter 2+ edema BLE Chronic R knee pain  Lymphadenopathy:    She has no cervical adenopathy.  Neurological: She is alert and oriented to person, place, and time. She has normal reflexes. No cranial nerve deficit. Coordination normal.  Diminished vibratory and monofilament testing in both feet.  Skin: No rash (Reddened rash on both legs. Some itching and burning associated with the rash.) noted. No erythema. No pallor.  Prior exam: 6 mm nodule of the left posterior neck at the SCM muscle seems resolved Likely SCC left leg medial to shin.  Psychiatric: She has a normal mood and affect. Her behavior is normal. Judgment and thought content normal.    Labs reviewed: Basic Metabolic Panel:  Recent Labs  11/27/14 0540 11/28/14 0550 11/29/14 0615  12/07/14 12/21/14 12/29/14  NA 128* 128* 129*  < > 131* 137 136*  K 5.3* 5.0 4.8  < > 5.1 4.4 4.3  CL 91* 90* 92*  --   --   --   --   CO2 29 30 29   --   --   --   --   GLUCOSE 188* 277* 216*  --   --   --   --   BUN 31* 35* 27*  < > 26* 11 19  CREATININE 0.76 0.87 0.78  < > 0.9 0.5 0.7  CALCIUM 8.7* 8.6* 8.9  --   --   --   --   < > = values in this interval not displayed.  Liver Function Tests:  Recent Labs  11/22/14 11/24/14 1013 12/07/14  AST 16 23 12*  ALT 17 24 11   ALKPHOS 103 102 77  BILITOT  --  0.5  --   PROT  --  6.3*  --   ALBUMIN  --  2.8*  --     CBC:  Recent Labs  11/24/14 1013  11/27/14 0812 11/28/14 0550 11/29/14 0615  12/07/14 12/21/14 12/29/14  WBC 17.9*  < > 21.1* 17.4* 16.6*  < > 13.7 7.9 8.5  NEUTROABS 14.8*  --   --   --   --   --   --   --   --   HGB 11.8*  < > 13.2 12.6 13.0  < > 10.4* 9.6* 9.2*  HCT 35.1*   < > 38.7 37.7 39.2  < > 31* 30* 29*  MCV 90.7  < > 90.4 91.1 91.0  --   --   --   --   PLT 263  < > 274 251 245  < > 198 296 279  < > = values in this interval not displayed.  Lab Results  Component Value Date   TSH 0.58 11/22/2014   Lab Results  Component Value Date   HGBA1C 7.6* 11/22/2014   Lab Results  Component Value Date   CHOL 108 10/27/2013   HDL 54 10/27/2013   LDLCALC 33 10/27/2013   TRIG 103 10/27/2013    Significant Diagnostic Results since last visit: none  Patient Care Team: Estill Dooms, MD as PCP - General (Internal Medicine) Clent Jacks, MD (Ophthalmology) Adrian Prows, MD as Attending Physician (Cardiology) [facility] [name], MD as Consulting Physician (Orthopedic Surgery) Kathee Delton, MD as Consulting Physician (Pulmonary Disease)  Assessment/Plan Problem List Items Addressed This Visit    COPD (chronic obstructive pulmonary disease) ([place]) - Primary (Chronic)    Continue O2 [place], change Neb to prn, continue prednisone5mg , Diabetic Tussin prn, 12/16/14 CXR cardiomegaly and chronic appearing changes, no evidence of acute pulmonary pathology, no significant interval change. May consider Hospice referral when she desires.       Hypertension (Chronic)    - controlled, continue Cardura 8mg  daily, Metoprolol 25mg  bid, Losartan 50mg  and prn Clonidine 0.1mg  fro Bp>160/100 - On lasix 20mg  and off Spironolactone 25mg  daily, 2+BLE edema - Continue statin       Type 2 diabetes with decreased circulation (HCC) (Chronic)    continue Lantus 10u qd, Humalog 5u with meals, total 8u if CBG>150      Edema (Chronic)    2+ edema BLE, continue Furosemide 20mg  daily and off Spironolactone 25mg  daily. BNP 123 12/29/14, weight weekly. Monitor for s/s of developing CHF      Esophageal reflux    Resolved nausea and vomiting, denied abd pain or indigestion, continue Omeprazole 20mg  daily       Chronic combined systolic and diastolic congestive heart failure  ([place])    12/07/14 BNP 188, Na 131 12/21/14 BNP198.1 Na 137 12/29/14 BNP 123 Continue Furosemide 20mg  daily, weight weekly, monitor for s/s of developing CHF          Family/ staff Communication: monitor for respiratory symptoms.   Labs/tests ordered: weekly weight  ManXie Tayt Moyers NP [facility] [address], [place] [address] On Call:  [phone] & follow prompts after 5pm & weekends Office Phone:  [phone] Office Fax:  [phone]

## 2015-01-05 NOTE — Assessment & Plan Note (Signed)
-   controlled, continue Cardura 8mg  daily, Metoprolol 25mg  bid, Losartan 50mg  and prn Clonidine 0.1mg  fro Bp>160/100 - On lasix 20mg  and off Spironolactone 25mg  daily, 2+BLE edema - Continue statin

## 2015-01-05 NOTE — Assessment & Plan Note (Signed)
continue Lantus 10u qd, Humalog 5u with meals, total 8u if CBG>150

## 2015-01-05 NOTE — Assessment & Plan Note (Signed)
2+ edema BLE, continue Furosemide 20mg  daily and off Spironolactone 25mg  daily. BNP 123 12/29/14, weight weekly. Monitor for s/s of developing CHF

## 2015-01-05 NOTE — Assessment & Plan Note (Signed)
12/07/14 BNP 188, Na 131 12/21/14 BNP198.1 Na 137 12/29/14 BNP 123 Continue Furosemide 20mg  daily, weight weekly, monitor for s/s of developing CHF

## 2015-01-05 NOTE — Assessment & Plan Note (Signed)
Resolved nausea and vomiting, denied abd pain or indigestion, continue Omeprazole 20mg daily  

## 2015-02-09 ENCOUNTER — Encounter: Payer: Self-pay | Admitting: Nurse Practitioner

## 2015-02-09 ENCOUNTER — Non-Acute Institutional Stay (SKILLED_NURSING_FACILITY): Payer: Medicare Other | Admitting: Nurse Practitioner

## 2015-02-09 DIAGNOSIS — I5042 Chronic combined systolic (congestive) and diastolic (congestive) heart failure: Secondary | ICD-10-CM

## 2015-02-09 DIAGNOSIS — D638 Anemia in other chronic diseases classified elsewhere: Secondary | ICD-10-CM

## 2015-02-09 DIAGNOSIS — I10 Essential (primary) hypertension: Secondary | ICD-10-CM | POA: Diagnosis not present

## 2015-02-09 DIAGNOSIS — E1151 Type 2 diabetes mellitus with diabetic peripheral angiopathy without gangrene: Secondary | ICD-10-CM

## 2015-02-09 DIAGNOSIS — R609 Edema, unspecified: Secondary | ICD-10-CM | POA: Diagnosis not present

## 2015-02-09 DIAGNOSIS — K219 Gastro-esophageal reflux disease without esophagitis: Secondary | ICD-10-CM | POA: Diagnosis not present

## 2015-02-09 DIAGNOSIS — J410 Simple chronic bronchitis: Secondary | ICD-10-CM | POA: Diagnosis not present

## 2015-02-09 NOTE — Assessment & Plan Note (Signed)
2+ edema BLE, continue Furosemide 20mg  daily and off Spironolactone 25mg  daily. BNP 123 12/29/14, weight weekly. Monitor for s/s of developing CHF

## 2015-02-09 NOTE — Assessment & Plan Note (Signed)
Stable, continue Omeprazole 20mg daily.  

## 2015-02-09 NOTE — Assessment & Plan Note (Signed)
12/07/14 BNP 188, Na 131 12/21/14 BNP198.1 Na 137 12/29/14 BNP 123 Continue Furosemide 20mg  daily, weight weekly, monitor for s/s of developing CHF

## 2015-02-09 NOTE — Assessment & Plan Note (Signed)
12/07/14 Hgb 10.4, MCV 89.3, MCH 30.1 12/21/14 Hgb 9.6 12/29/14 Hgb 9.2 Update CBC

## 2015-02-09 NOTE — Assessment & Plan Note (Signed)
-   controlled, continue Cardura 8mg  daily, Metoprolol 25mg  bid, Losartan 50mg  and prn Clonidine 0.1mg  fro Bp>160/100 - On lasix 20mg  and off Spironolactone 25mg  daily, 2+BLE edema - Continue statin  - update CMP, BNP

## 2015-02-09 NOTE — Progress Notes (Signed)
Patient ID: [name], female   DOB: March 16, 1917, 82 y.o.   MRN: [id number]  Location:  SNF [facility] Provider:  Marlana Latus NP  Code Status:  DNR Goals of care: Advanced Directive information    Chief Complaint  Patient presents with  . Medical Management of Chronic Issues  . Acute Visit    blood sugar     HPI: Patient is a 82 y.o. female seen in the SNF at [facility] today for evaluation of BLE edema,  chronic Bronchiectasis with acute exacerbation ([place]) HCAP (healthcare-associated pneumonia), fully treated, recurred central expiratory wheezes today. Blood sugar is managed with Lantus 10u, Humalog 8 u if CBG>150. Not constipation while on MiraLax prn.   Review of Systems  Constitutional: Negative for fever, chills and diaphoresis.       Baseline mobility is motorized scooter  HENT: Positive for hearing loss. Negative for congestion, ear discharge and ear pain.   Eyes: Negative for photophobia and pain.  Respiratory: Positive for cough and shortness of breath. Negative for sputum production and wheezing.   Cardiovascular: Positive for leg swelling. Negative for chest pain and palpitations.       2+,  Right knee pain.   Gastrointestinal: Positive for constipation. Negative for nausea, vomiting, abdominal pain, diarrhea and melena.  Genitourinary: Positive for frequency. Negative for dysuria and urgency.       Recurrent urinary tract infections. Nocturia. Urinary frequency.  Musculoskeletal: Negative for myalgias, back pain and neck pain.       R knee pain  Skin: Negative for rash.       Healing wound of the left hand dorsum near the thumb where a lesion was removed by Dr. Redmond Pulling at the [facility].  Neurological: Negative for dizziness, tremors, seizures, weakness and headaches.  Endo/Heme/Allergies: Negative for environmental allergies.  Psychiatric/Behavioral: Negative for suicidal ideas and hallucinations. The patient is not nervous/anxious.      Past Medical History  Diagnosis Date  . Type II or unspecified type diabetes mellitus without mention of complication, not stated as uncontrolled   . Hypertension   . Arthritis   . COPD (chronic obstructive pulmonary disease) ([place])   . Regional enteritis of unspecified site   . Other malaise and fatigue   . Anemia, unspecified   . Other and unspecified hyperlipidemia   . Hypoxemia   . Pneumonia, organism unspecified   . Mucopurulent chronic bronchitis ([place])   . Palpitations   . Nontoxic uninodular goiter   . Abnormality of gait   . Fall from other slipping, tripping, or stumbling   . Cholelithiases   . Closed fracture of unspecified part of ramus of mandible   . Disturbance of skin sensation   . Sciatica   . Tension headache   . Coronary atherosclerosis of unspecified type of vessel, native or graft   . Osteoarthrosis, unspecified whether generalized or localized, unspecified site   . Insomnia, unspecified   . Edema   . Tension headache   . Pain in joint, lower leg 2010    right knee  . Unspecified venous (peripheral) insufficiency   . Esophageal reflux   . Rosacea   . Cataract 1990    Dr. Katy Fitch  . Pain in joint, shoulder region 2013    left  . Gastric ulcer with hemorrhage 2008  . Urine, incontinence, stress female 07/19/2013  . Bronchiectasis without acute exacerbation ([place]) 04/10/2011    CT chest 2011   . DOE (dyspnea on exertion)  04/10/2011    PFTs 2013:  FEV1 1.04 (78%), ratio 64, couldn't do maneuver for lung volumes or dlco   . Nodule of neck 11/07/2013    Left neck posteriorly   . Squamous cell carcinoma of skin of lower limb, including hip 07/27/2012    Nonhealing lesion of the left mid calf medially   . Tachycardia 12/06/2014    Patient Active Problem List   Diagnosis Date Noted  . Chronic combined systolic and diastolic congestive heart failure ([place]) 12/08/2014  . Anemia of chronic disease 12/08/2014  . Tachycardia 12/06/2014  . Constipation 12/05/2014   . Hyponatremia 11/24/2014  . Infection of urinary tract 06/23/2014  . Insomnia 11/21/2013  . Nodule of neck 11/07/2013  . Palpitations 09/13/2013  . Nocturia 09/13/2013  . Hyperlipidemia 07/19/2013  . Urine, incontinence, stress female 07/19/2013  . Type 2 diabetes with decreased circulation ([place]) 05/24/2013  . Edema 05/24/2013  . Esophageal reflux   . Pain in joint, shoulder region 07/27/2012  . Hypertension   . Arthritis   . Other malaise and fatigue   . Mucopurulent chronic bronchitis ([place])   . Pain in joint, lower leg   . COPD (chronic obstructive pulmonary disease) ([place]) 02/06/2012  . DOE (dyspnea on exertion) 04/10/2011  . Bronchiectasis without acute exacerbation ([place]) 04/10/2011  . Pulmonary nodules 04/10/2011    Allergies  Allergen Reactions  . Adhesive [Tape]     unknown  . Aspirin     unknown  . Codeine     unknown  . Enalapril Cough  . Hydrocodone     unknown  . Pantoprazole Sodium     unknown    Medications: Patient's Medications  New Prescriptions   No medications on file  Previous Medications   ACETAMINOPHEN (TYLENOL) 325 MG TABLET    Take 650 mg by mouth every 6 (six) hours as needed for moderate pain or headache.   ACETAMINOPHEN (TYLENOL) 500 MG TABLET    Take 500 mg by mouth 2 (two) times daily.    ATORVASTATIN (LIPITOR) 10 MG TABLET    Take 10 mg by mouth daily.   BUDESONIDE (PULMICORT) 1 MG/2ML NEBULIZER SOLUTION    Take 1 mg by nebulization 2 (two) times daily. Inhale every 12 hours if needed for dyspnea   CALCIUM CARBONATE ANTACID (CALCIUM ANTACID PO)    Take 600 mg by mouth 3 (three) times daily as needed (heartburn).   CLONIDINE (CATAPRES) 0.1 MG TABLET    Take 0.1 mg by mouth. Take one tablet as needed for bp > 160/100   DOXAZOSIN (CARDURA) 8 MG TABLET    Take 8 mg by mouth daily. To control BP.   FUROSEMIDE (LASIX) 20 MG TABLET    Take 20 mg by mouth daily.   GLUCOSE BLOOD TEST STRIP    Use three time daily to test blood sugar. Contour  Test Strips. Dx:E11.9   GUAIFENESIN (ROBITUSSIN) 100 MG/5ML LIQUID    Take 200 mg by mouth. Take 10 ml every 6 hours as needed for cough   INSULIN GLARGINE (LANTUS) 100 UNIT/ML INJECTION    Inject 10 Units into the skin at bedtime.    INSULIN LISPRO (HUMALOG) 100 UNIT/ML CARTRIDGE    Inject 5-8 Units into the skin 3 (three) times daily before meals. Scheduled 5 units TID if blood sugar level is about 150 give an additional 3 units= 8units   IPRATROPIUM-ALBUTEROL (DUONEB) 0.5-2.5 (3) MG/3ML SOLN    Take 3 mLs by nebulization 2 (two) times daily. Inhale  every 12 hours as needed for dyspnea   LOSARTAN (COZAAR) 50 MG TABLET    Take 50 mg by mouth daily.   OMEPRAZOLE (PRILOSEC) 20 MG CAPSULE    Take 20 mg by mouth daily.    PROMETHAZINE (PHENERGAN) 25 MG TABLET    Take 25 mg by mouth every 4 (four) hours as needed for nausea or vomiting.   SPIRONOLACTONE (ALDACTONE) 25 MG TABLET      Modified Medications   No medications on file  Discontinued Medications   No medications on file    Physical Exam: Filed Vitals:   02/09/15 1620  BP: 168/72  Pulse: 83  Temp: 96.5 F (35.8 C)  TempSrc: Tympanic  Resp: 20   There is no weight on file to calculate BMI.  Physical Exam  Constitutional: She is oriented to person, place, and time. She appears well-developed and well-nourished. No distress.  HENT:  Head: Normocephalic and atraumatic.  Right Ear: External ear normal.  Left Ear: External ear normal.  Nose: Nose normal.  Eyes: Conjunctivae and EOM are normal. Pupils are equal, round, and reactive to light.  Neck: Neck supple. No JVD present. No tracheal deviation present. No thyromegaly present.  Cardiovascular: Normal rate, regular rhythm, normal heart sounds and intact distal pulses.  Exam reveals no gallop and no friction rub.   No murmur heard. Pulmonary/Chest: No respiratory distress. She has no wheezes. She has rales.  Moist rales posterior mid to lower lungs  Abdominal: Bowel sounds are  normal. She exhibits no distension and no mass. There is no tenderness.  Musculoskeletal: She exhibits edema (2-3+ bipedal) and tenderness.  Scars from bilateral TKR. Right knee seems to be loose when stressing the joint. No effusion. Instable gait. Driving a power scooter 2+ edema BLE Chronic R knee pain  Lymphadenopathy:    She has no cervical adenopathy.  Neurological: She is alert and oriented to person, place, and time. She has normal reflexes. No cranial nerve deficit. Coordination normal.  Diminished vibratory and monofilament testing in both feet.  Skin: No rash (Reddened rash on both legs. Some itching and burning associated with the rash.) noted. No erythema. No pallor.  Prior exam: 6 mm nodule of the left posterior neck at the SCM muscle seems resolved Likely SCC left leg medial to shin.  Psychiatric: She has a normal mood and affect. Her behavior is normal. Judgment and thought content normal.    Labs reviewed: Basic Metabolic Panel:  Recent Labs  11/27/14 0540 11/28/14 0550 11/29/14 0615  12/07/14 12/21/14 12/29/14  NA 128* 128* 129*  < > 131* 137 136*  K 5.3* 5.0 4.8  < > 5.1 4.4 4.3  CL 91* 90* 92*  --   --   --   --   CO2 29 30 29   --   --   --   --   GLUCOSE 188* 277* 216*  --   --   --   --   BUN 31* 35* 27*  < > 26* 11 19  CREATININE 0.76 0.87 0.78  < > 0.9 0.5 0.7  CALCIUM 8.7* 8.6* 8.9  --   --   --   --   < > = values in this interval not displayed.  Liver Function Tests:  Recent Labs  11/22/14 11/24/14 1013 12/07/14  AST 16 23 12*  ALT 17 24 11   ALKPHOS 103 102 77  BILITOT  --  0.5  --   PROT  --  6.3*  --  ALBUMIN  --  2.8*  --     CBC:  Recent Labs  11/24/14 1013  11/27/14 0812 11/28/14 0550 11/29/14 0615  12/07/14 12/21/14 12/29/14  WBC 17.9*  < > 21.1* 17.4* 16.6*  < > 13.7 7.9 8.5  NEUTROABS 14.8*  --   --   --   --   --   --   --   --   HGB 11.8*  < > 13.2 12.6 13.0  < > 10.4* 9.6* 9.2*  HCT 35.1*  < > 38.7 37.7 39.2  < > 31*  30* 29*  MCV 90.7  < > 90.4 91.1 91.0  --   --   --   --   PLT 263  < > 274 251 245  < > 198 296 279  < > = values in this interval not displayed.  Lab Results  Component Value Date   TSH 0.58 11/22/2014   Lab Results  Component Value Date   HGBA1C 7.6* 11/22/2014   Lab Results  Component Value Date   CHOL 108 10/27/2013   HDL 54 10/27/2013   LDLCALC 33 10/27/2013   TRIG 103 10/27/2013    Significant Diagnostic Results since last visit: none  Patient Care Team: Estill Dooms, MD as PCP - General (Internal Medicine) Clent Jacks, MD (Ophthalmology) Adrian Prows, MD as Attending Physician (Cardiology) [facility] [name], MD as Consulting Physician (Orthopedic Surgery) Kathee Delton, MD as Consulting Physician (Pulmonary Disease)  Assessment/Plan Problem List Items Addressed This Visit    Type 2 diabetes with decreased circulation ([place]) - Primary (Chronic)    continue Lantus 10u qd, Humalog 5u with meals, total 8u if CBG>150, update Hgb A1c      Hypertension (Chronic)    - controlled, continue Cardura 8mg  daily, Metoprolol 25mg  bid, Losartan 50mg  and prn Clonidine 0.1mg  fro Bp>160/100 - On lasix 20mg  and off Spironolactone 25mg  daily, 2+BLE edema - Continue statin  - update CMP, BNP      Esophageal reflux    Stable, continue Omeprazole 20mg  daily       Edema (Chronic)    2+ edema BLE, continue Furosemide 20mg  daily and off Spironolactone 25mg  daily. BNP 123 12/29/14, weight weekly. Monitor for s/s of developing CHF      COPD (chronic obstructive pulmonary disease) ([facility]) (Chronic)    Continue O2 [place], change Neb to prn, continue prednisone5mg , Diabetic Tussin prn, 12/16/14 CXR cardiomegaly and chronic appearing changes, no evidence of acute pulmonary pathology, no significant interval change. May consider Hospice referral when she desires. Obtain CBC, TSH      Chronic combined systolic and diastolic congestive heart failure ([place])    12/07/14 BNP 188, Na  131 12/21/14 BNP198.1 Na 137 12/29/14 BNP 123 Continue Furosemide 20mg  daily, weight weekly, monitor for s/s of developing CHF      Anemia of chronic disease    12/07/14 Hgb 10.4, MCV 89.3, MCH 30.1 12/21/14 Hgb 9.6 12/29/14 Hgb 9.2 Update CBC          Family/ staff Communication: monitor for respiratory symptoms.   Labs/tests ordered: CBC, CMP, BNP, Hgb A1c, TSH  ManXie Cinthia Rodden NP [facility] [address], [place] [address] On Call:  [phone] & follow prompts after 5pm & weekends Office Phone:  [phone] Office Fax:  [phone]

## 2015-02-09 NOTE — Assessment & Plan Note (Signed)
continue Lantus 10u qd, Humalog 5u with meals, total 8u if CBG>150, update Hgb A1c

## 2015-02-09 NOTE — Assessment & Plan Note (Signed)
Continue O2 [place], change Neb to prn, continue prednisone5mg , Diabetic Tussin prn, 12/16/14 CXR cardiomegaly and chronic appearing changes, no evidence of acute pulmonary pathology, no significant interval change. May consider Hospice referral when she desires. Obtain CBC, TSH

## 2015-02-13 ENCOUNTER — Other Ambulatory Visit: Payer: Self-pay | Admitting: Nurse Practitioner

## 2015-02-13 DIAGNOSIS — I5042 Chronic combined systolic (congestive) and diastolic (congestive) heart failure: Secondary | ICD-10-CM

## 2015-02-13 DIAGNOSIS — D638 Anemia in other chronic diseases classified elsewhere: Secondary | ICD-10-CM

## 2015-02-13 DIAGNOSIS — E871 Hypo-osmolality and hyponatremia: Secondary | ICD-10-CM

## 2015-02-13 DIAGNOSIS — E1151 Type 2 diabetes mellitus with diabetic peripheral angiopathy without gangrene: Secondary | ICD-10-CM

## 2015-02-13 LAB — CBC AND DIFFERENTIAL
HCT: 32 % — AB (ref 36–46)
Hemoglobin: 9.7 g/dL — AB (ref 12.0–16.0)
Platelets: 215 10*3/uL (ref 150–399)
WBC: 9 10^3/mL

## 2015-02-13 LAB — BASIC METABOLIC PANEL
BUN: 16 mg/dL (ref 4–21)
CREATININE: 0.6 mg/dL (ref 0.5–1.1)
GLUCOSE: 223 mg/dL
POTASSIUM: 4.7 mmol/L (ref 3.4–5.3)
SODIUM: 131 mmol/L — AB (ref 137–147)

## 2015-02-13 LAB — HEPATIC FUNCTION PANEL
ALT: 8 U/L (ref 7–35)
AST: 10 U/L — AB (ref 13–35)
Alkaline Phosphatase: 68 U/L (ref 25–125)
BILIRUBIN, TOTAL: 0.4 mg/dL

## 2015-02-13 LAB — HEMOGLOBIN A1C: Hemoglobin A1C: 7.3

## 2015-02-13 LAB — TSH: TSH: 0.82 u[IU]/mL (ref 0.41–5.90)

## 2015-02-16 ENCOUNTER — Encounter: Payer: Self-pay | Admitting: Nurse Practitioner

## 2015-02-16 ENCOUNTER — Non-Acute Institutional Stay (SKILLED_NURSING_FACILITY): Payer: Medicare Other | Admitting: Nurse Practitioner

## 2015-02-16 DIAGNOSIS — R609 Edema, unspecified: Secondary | ICD-10-CM | POA: Diagnosis not present

## 2015-02-16 DIAGNOSIS — K219 Gastro-esophageal reflux disease without esophagitis: Secondary | ICD-10-CM

## 2015-02-16 DIAGNOSIS — E1151 Type 2 diabetes mellitus with diabetic peripheral angiopathy without gangrene: Secondary | ICD-10-CM | POA: Diagnosis not present

## 2015-02-16 DIAGNOSIS — I10 Essential (primary) hypertension: Secondary | ICD-10-CM

## 2015-02-16 DIAGNOSIS — I5042 Chronic combined systolic (congestive) and diastolic (congestive) heart failure: Secondary | ICD-10-CM

## 2015-02-16 DIAGNOSIS — D638 Anemia in other chronic diseases classified elsewhere: Secondary | ICD-10-CM | POA: Diagnosis not present

## 2015-02-16 DIAGNOSIS — E871 Hypo-osmolality and hyponatremia: Secondary | ICD-10-CM | POA: Diagnosis not present

## 2015-02-16 DIAGNOSIS — J440 Chronic obstructive pulmonary disease with acute lower respiratory infection: Secondary | ICD-10-CM

## 2015-02-16 NOTE — Assessment & Plan Note (Signed)
12/07/14 BNP 188, Na 131 12/21/14 BNP198.1 Na 137 12/29/14 BNP 123 Weight gain #6 ibs in the past 3 weeks, noted increased DOE, BLE edema, continue Furosemide 20mg  daily, adding Spironolactone 25mg , monitor for s/s of developing CHF. Update CBC, CMP, BNP, CXR

## 2015-02-16 NOTE — Progress Notes (Signed)
Patient ID: [name], female   DOB: July 10, 1917, 82 y.o.   MRN: [id number]  Location:  SNF [facility] Provider:  Marlana Latus NP  Code Status:  DNR Goals of care: Advanced Directive information    Chief Complaint  Patient presents with  . Medical Management of Chronic Issues  . Acute Visit    hypoxia, weight gain     HPI: Patient is a 82 y.o. female seen in the SNF at [facility] today for evaluation of weight gain, # 6Ibs in 3 weeks, O2 desat, 89% when exertion, BLE edema,  chronic Bronchiectasis with acute exacerbation ([place]) HCAP (healthcare-associated pneumonia), fully treated, recurred central expiratory wheezes today. Blood sugar is managed with Lantus 10u, Humalog 8 u if CBG>150. Not constipation while on MiraLax prn.   Review of Systems  Constitutional: Negative for fever, chills and diaphoresis.       Baseline mobility is motorized scooter  HENT: Positive for hearing loss. Negative for congestion, ear discharge and ear pain.   Eyes: Negative for photophobia and pain.  Respiratory: Positive for cough and shortness of breath. Negative for sputum production and wheezing.   Cardiovascular: Positive for leg swelling. Negative for chest pain and palpitations.       2+,  Right knee pain.   Gastrointestinal: Positive for constipation. Negative for nausea, vomiting, abdominal pain, diarrhea and melena.  Genitourinary: Positive for frequency. Negative for dysuria and urgency.       Recurrent urinary tract infections. Nocturia. Urinary frequency.  Musculoskeletal: Negative for myalgias, back pain and neck pain.       R knee pain  Skin: Negative for rash.       Healing wound of the left hand dorsum near the thumb where a lesion was removed by Dr. Redmond Pulling at the [place] at [facility].  Neurological: Negative for dizziness, tremors, seizures, weakness and headaches.  Endo/Heme/Allergies: Negative for environmental allergies.  Psychiatric/Behavioral: Negative for suicidal  ideas and hallucinations. The patient is not nervous/anxious.     Past Medical History  Diagnosis Date  . Type II or unspecified type diabetes mellitus without mention of complication, not stated as uncontrolled   . Hypertension   . Arthritis   . COPD (chronic obstructive pulmonary disease) ([place])   . Regional enteritis of unspecified site   . Other malaise and fatigue   . Anemia, unspecified   . Other and unspecified hyperlipidemia   . Hypoxemia   . Pneumonia, organism unspecified   . Mucopurulent chronic bronchitis ([place])   . Palpitations   . Nontoxic uninodular goiter   . Abnormality of gait   . Fall from other slipping, tripping, or stumbling   . Cholelithiases   . Closed fracture of unspecified part of ramus of mandible   . Disturbance of skin sensation   . Sciatica   . Tension headache   . Coronary atherosclerosis of unspecified type of vessel, native or graft   . Osteoarthrosis, unspecified whether generalized or localized, unspecified site   . Insomnia, unspecified   . Edema   . Tension headache   . Pain in joint, lower leg 2010    right knee  . Unspecified venous (peripheral) insufficiency   . Esophageal reflux   . Rosacea   . Cataract 1990    Dr. Katy Fitch  . Pain in joint, shoulder region 2013    left  . Gastric ulcer with hemorrhage 2008  . Urine, incontinence, stress female 07/19/2013  . Bronchiectasis without acute exacerbation ([place]) 04/10/2011  CT chest 2011   . DOE (dyspnea on exertion) 04/10/2011    PFTs 2013:  FEV1 1.04 (78%), ratio 64, couldn't do maneuver for lung volumes or dlco   . Nodule of neck 11/07/2013    Left neck posteriorly   . Squamous cell carcinoma of skin of lower limb, including hip 07/27/2012    Nonhealing lesion of the left mid calf medially   . Tachycardia 12/06/2014    Patient Active Problem List   Diagnosis Date Noted  . Chronic combined systolic and diastolic congestive heart failure ([place]) 12/08/2014  . Anemia of chronic disease  12/08/2014  . Tachycardia 12/06/2014  . Constipation 12/05/2014  . Hyponatremia 11/24/2014  . Infection of urinary tract 06/23/2014  . Insomnia 11/21/2013  . Nodule of neck 11/07/2013  . Palpitations 09/13/2013  . Nocturia 09/13/2013  . Hyperlipidemia 07/19/2013  . Urine, incontinence, stress female 07/19/2013  . Type 2 diabetes with decreased circulation ([place]) 05/24/2013  . Edema 05/24/2013  . Esophageal reflux   . Pain in joint, shoulder region 07/27/2012  . Hypertension   . Arthritis   . Other malaise and fatigue   . Mucopurulent chronic bronchitis ([place])   . Pain in joint, lower leg   . COPD (chronic obstructive pulmonary disease) ([place]) 02/06/2012  . DOE (dyspnea on exertion) 04/10/2011  . Bronchiectasis without acute exacerbation ([place]) 04/10/2011  . Pulmonary nodules 04/10/2011    Allergies  Allergen Reactions  . Adhesive [Tape]     unknown  . Aspirin     unknown  . Codeine     unknown  . Enalapril Cough  . Hydrocodone     unknown  . Pantoprazole Sodium     unknown    Medications: Patient's Medications  New Prescriptions   No medications on file  Previous Medications   ACETAMINOPHEN (TYLENOL) 325 MG TABLET    Take 650 mg by mouth every 6 (six) hours as needed for moderate pain or headache.   ACETAMINOPHEN (TYLENOL) 500 MG TABLET    Take 500 mg by mouth 2 (two) times daily.    ATORVASTATIN (LIPITOR) 10 MG TABLET    Take 10 mg by mouth daily.   BUDESONIDE (PULMICORT) 1 MG/2ML NEBULIZER SOLUTION    Take 1 mg by nebulization 2 (two) times daily. Inhale every 12 hours if needed for dyspnea   CALCIUM CARBONATE ANTACID (CALCIUM ANTACID PO)    Take 600 mg by mouth 3 (three) times daily as needed (heartburn).   CLONIDINE (CATAPRES) 0.1 MG TABLET    Take 0.1 mg by mouth. Take one tablet as needed for bp > 160/100   DOXAZOSIN (CARDURA) 8 MG TABLET    Take 8 mg by mouth daily. To control BP.   FUROSEMIDE (LASIX) 20 MG TABLET    Take 20 mg by mouth daily.   GLUCOSE BLOOD  TEST STRIP    Use three time daily to test blood sugar. Contour Test Strips. Dx:E11.9   GUAIFENESIN (ROBITUSSIN) 100 MG/5ML LIQUID    Take 200 mg by mouth. Take 10 ml every 6 hours as needed for cough   INSULIN GLARGINE (LANTUS) 100 UNIT/ML INJECTION    Inject 10 Units into the skin at bedtime.    INSULIN LISPRO (HUMALOG) 100 UNIT/ML CARTRIDGE    Inject 5-8 Units into the skin 3 (three) times daily before meals. Scheduled 5 units TID if blood sugar level is about 150 give an additional 3 units= 8units   IPRATROPIUM-ALBUTEROL (DUONEB) 0.5-2.5 (3) MG/3ML SOLN  Take 3 mLs by nebulization 2 (two) times daily. Inhale every 12 hours as needed for dyspnea   LOSARTAN (COZAAR) 50 MG TABLET    Take 50 mg by mouth daily.   OMEPRAZOLE (PRILOSEC) 20 MG CAPSULE    Take 20 mg by mouth daily.    PROMETHAZINE (PHENERGAN) 25 MG TABLET    Take 25 mg by mouth every 4 (four) hours as needed for nausea or vomiting.   SPIRONOLACTONE (ALDACTONE) 25 MG TABLET      Modified Medications   No medications on file  Discontinued Medications   No medications on file    Physical Exam: Filed Vitals:   02/16/15 1352  BP: 132/64  Pulse: 90  Temp: 98 F (36.7 C)  TempSrc: Tympanic  Resp: 26  SpO2: 94%   There is no weight on file to calculate BMI.  Physical Exam  Constitutional: She is oriented to person, place, and time. She appears well-developed and well-nourished. No distress.  HENT:  Head: Normocephalic and atraumatic.  Right Ear: External ear normal.  Left Ear: External ear normal.  Nose: Nose normal.  Eyes: Conjunctivae and EOM are normal. Pupils are equal, round, and reactive to light.  Neck: Neck supple. No JVD present. No tracheal deviation present. No thyromegaly present.  Cardiovascular: Normal rate, regular rhythm, normal heart sounds and intact distal pulses.  Exam reveals no gallop and no friction rub.   No murmur heard. Pulmonary/Chest: No respiratory distress. She has no wheezes. She has rales.    Moist rales posterior mid to lower lungs  Abdominal: Bowel sounds are normal. She exhibits no distension and no mass. There is no tenderness.  Musculoskeletal: She exhibits edema (2-3+ bipedal) and tenderness.  Scars from bilateral TKR. Right knee seems to be loose when stressing the joint. No effusion. Instable gait. Driving a power scooter 2+ edema BLE Chronic R knee pain  Lymphadenopathy:    She has no cervical adenopathy.  Neurological: She is alert and oriented to person, place, and time. She has normal reflexes. No cranial nerve deficit. Coordination normal.  Diminished vibratory and monofilament testing in both feet.  Skin: No rash (Reddened rash on both legs. Some itching and burning associated with the rash.) noted. No erythema. No pallor.  Prior exam: 6 mm nodule of the left posterior neck at the SCM muscle seems resolved Likely SCC left leg medial to shin.  Psychiatric: She has a normal mood and affect. Her behavior is normal. Judgment and thought content normal.    Labs reviewed: Basic Metabolic Panel:  Recent Labs  11/27/14 0540 11/28/14 0550 11/29/14 0615  12/21/14 12/29/14 02/13/15  NA 128* 128* 129*  < > 137 136* 131*  K 5.3* 5.0 4.8  < > 4.4 4.3 4.7  CL 91* 90* 92*  --   --   --   --   CO2 29 30 29   --   --   --   --   GLUCOSE 188* 277* 216*  --   --   --   --   BUN 31* 35* 27*  < > 11 19 16   CREATININE 0.76 0.87 0.78  < > 0.5 0.7 0.6  CALCIUM 8.7* 8.6* 8.9  --   --   --   --   < > = values in this interval not displayed.  Liver Function Tests:  Recent Labs  11/24/14 1013 12/07/14 02/13/15  AST 23 12* 10*  ALT 24 11 8   ALKPHOS 102 77 68  BILITOT 0.5  --   --   PROT 6.3*  --   --   ALBUMIN 2.8*  --   --     CBC:  Recent Labs  11/24/14 1013  11/27/14 0812 11/28/14 0550 11/29/14 0615  12/21/14 12/29/14 02/13/15  WBC 17.9*  < > 21.1* 17.4* 16.6*  < > 7.9 8.5 9.0  NEUTROABS 14.8*  --   --   --   --   --   --   --   --   HGB 11.8*  < > 13.2 12.6  13.0  < > 9.6* 9.2* 9.7*  HCT 35.1*  < > 38.7 37.7 39.2  < > 30* 29* 32*  MCV 90.7  < > 90.4 91.1 91.0  --   --   --   --   PLT 263  < > 274 251 245  < > 296 279 215  < > = values in this interval not displayed.  Lab Results  Component Value Date   TSH 0.82 02/13/2015   Lab Results  Component Value Date   HGBA1C 7.3 02/13/2015   Lab Results  Component Value Date   CHOL 108 10/27/2013   HDL 54 10/27/2013   LDLCALC 33 10/27/2013   TRIG 103 10/27/2013    Significant Diagnostic Results since last visit: none  Patient Care Team: Estill Dooms, MD as PCP - General (Internal Medicine) Clent Jacks, MD (Ophthalmology) Adrian Prows, MD as Attending Physician (Cardiology) [facility] [name], MD as Consulting Physician (Orthopedic Surgery) Kathee Delton, MD as Consulting Physician (Pulmonary Disease)  Assessment/Plan Problem List Items Addressed This Visit    Type 2 diabetes with decreased circulation ([place]) (Chronic)    continue Lantus 10u qd, Humalog 5u with meals, total 8u if CBG>150, 02/13/15 Hgb A1c 7.3      Hyponatremia    11/22/14 Na 121 11/30/14 Na 132 12/07/14 Na 131 12/21/14 Na 137 12/29/14 Na 136 02/13/15 Na 131 developing CHF contributory.       Hypertension (Chronic)    - controlled, continue Cardura 8mg  daily, Metoprolol 25mg  bid, Losartan 50mg  and prn Clonidine 0.1mg  fro Bp>160/100 - On lasix 20mg  and resumed 02/16/15 Spironolactone 25mg  daily, 2+BLE edema - Continue statin  - update CMP, BNP      Esophageal reflux    Stable, continue Omeprazole 20mg  daily       Edema (Chronic)    2+ edema BLE, continue Furosemide 20mg  daily and resume Spironolactone 25mg  daily. BNP 123 12/29/14, weight gained #6Ibs in the past 3 weeks. Monitor for s/s of developing CHF.       COPD (chronic obstructive pulmonary disease) (H[facility]) (Chronic)    Continue O2 [place], Neb prn, prednisone5mg , Diabetic Tussin prn, 12/16/14 CXR cardiomegaly and chronic appearing changes, no  evidence of acute pulmonary pathology, no significant interval change. May consider Hospice referral when she desires. Obtain CBC, CXR, gentle diurese the patient in setting of weight gain and increased SOB, O2 desat exertion.       Chronic combined systolic and diastolic congestive heart failure ([place]) - Primary    12/07/14 BNP 188, Na 131 12/21/14 BNP198.1 Na 137 12/29/14 BNP 123 Weight gain #6 ibs in the past 3 weeks, noted increased DOE, BLE edema, continue Furosemide 20mg  daily, adding Spironolactone 25mg , monitor for s/s of developing CHF. Update CBC, CMP, BNP, CXR      Anemia of chronic disease    12/07/14 Hgb 10.4, MCV 89.3, MCH 30.1 12/21/14 Hgb 9.6  12/29/14 Hgb 9.2 Update CBC          Family/ staff Communication: monitor for respiratory symptoms.   Labs/tests ordered: CBC, CMP, BNP, CXR  [facility] Emari Demmer NP [facility] [address], [place] [address] On Call:  [phone] & follow prompts after 5pm & weekends Office Phone:  [phone] Office Fax:  [phone]

## 2015-02-16 NOTE — Assessment & Plan Note (Signed)
12/07/14 Hgb 10.4, MCV 89.3, MCH 30.1 12/21/14 Hgb 9.6 12/29/14 Hgb 9.2 Update CBC

## 2015-02-16 NOTE — Assessment & Plan Note (Signed)
-   controlled, continue Cardura 8mg  daily, Metoprolol 25mg  bid, Losartan 50mg  and prn Clonidine 0.1mg  fro Bp>160/100 - On lasix 20mg  and resumed 02/16/15 Spironolactone 25mg  daily, 2+BLE edema - Continue statin  - update CMP, BNP

## 2015-02-16 NOTE — Assessment & Plan Note (Signed)
continue Lantus 10u qd, Humalog 5u with meals, total 8u if CBG>150, 02/13/15 Hgb A1c 7.3

## 2015-02-16 NOTE — Assessment & Plan Note (Signed)
Continue O2 [place], Neb prn, prednisone5mg , Diabetic Tussin prn, 12/16/14 CXR cardiomegaly and chronic appearing changes, no evidence of acute pulmonary pathology, no significant interval change. May consider Hospice referral when she desires. Obtain CBC, CXR, gentle diurese the patient in setting of weight gain and increased SOB, O2 desat exertion.

## 2015-02-16 NOTE — Assessment & Plan Note (Signed)
2+ edema BLE, continue Furosemide 20mg  daily and resume Spironolactone 25mg  daily. BNP 123 12/29/14, weight gained #6Ibs in the past 3 weeks. Monitor for s/s of developing CHF.

## 2015-02-16 NOTE — Assessment & Plan Note (Signed)
Stable, continue Omeprazole 20mg daily.  

## 2015-02-16 NOTE — Assessment & Plan Note (Signed)
11/22/14 Na 121 11/30/14 Na 132 12/07/14 Na 131 12/21/14 Na 137 12/29/14 Na 136 02/13/15 Na 131 developing CHF contributory.

## 2015-02-19 LAB — CBC AND DIFFERENTIAL
HCT: 32 % — AB (ref 36–46)
Hemoglobin: 10 g/dL — AB (ref 12.0–16.0)
PLATELETS: 193 10*3/uL (ref 150–399)
WBC: 7.5 10^3/mL

## 2015-02-19 LAB — BASIC METABOLIC PANEL
BUN: 17 mg/dL (ref 4–21)
CREATININE: 0.6 mg/dL (ref 0.5–1.1)
Glucose: 165 mg/dL
Potassium: 4.2 mmol/L (ref 3.4–5.3)
Sodium: 134 mmol/L — AB (ref 137–147)

## 2015-02-20 ENCOUNTER — Non-Acute Institutional Stay (SKILLED_NURSING_FACILITY): Payer: Medicare Other | Admitting: Nurse Practitioner

## 2015-02-20 ENCOUNTER — Encounter: Payer: Self-pay | Admitting: Nurse Practitioner

## 2015-02-20 DIAGNOSIS — I10 Essential (primary) hypertension: Secondary | ICD-10-CM

## 2015-02-20 DIAGNOSIS — J438 Other emphysema: Secondary | ICD-10-CM | POA: Diagnosis not present

## 2015-02-20 DIAGNOSIS — R609 Edema, unspecified: Secondary | ICD-10-CM

## 2015-02-20 DIAGNOSIS — K219 Gastro-esophageal reflux disease without esophagitis: Secondary | ICD-10-CM

## 2015-02-20 DIAGNOSIS — I5042 Chronic combined systolic (congestive) and diastolic (congestive) heart failure: Secondary | ICD-10-CM | POA: Diagnosis not present

## 2015-02-20 DIAGNOSIS — D638 Anemia in other chronic diseases classified elsewhere: Secondary | ICD-10-CM | POA: Diagnosis not present

## 2015-02-20 DIAGNOSIS — E871 Hypo-osmolality and hyponatremia: Secondary | ICD-10-CM | POA: Diagnosis not present

## 2015-02-20 DIAGNOSIS — E1151 Type 2 diabetes mellitus with diabetic peripheral angiopathy without gangrene: Secondary | ICD-10-CM | POA: Diagnosis not present

## 2015-02-20 NOTE — Assessment & Plan Note (Signed)
controlled, continue Cardura 8mg  daily, Metoprolol 25mg  bid, Losartan 50mg  and prn Clonidine 0.1mg  fro Bp>160/100 - On lasix 20mg  and resumed 02/16/15 Spironolactone 25mg  daily, 2+BLE edema - Continue statin

## 2015-02-20 NOTE — Assessment & Plan Note (Signed)
12/07/14 BNP 188, Na 131 12/21/14 BNP198.1 Na 137 12/29/14 BNP 123 02/13/15 BNP 195.4 02/20/15 BNP 154.5 Weight gain #6 ibs in the past 3 weeks, noted increased DOE, BLE edema, continue Furosemide 20mg  daily, Spironolactone 25mg , monitor for s/s of developing CHF. 02/17/15 CXR shwoed CHF, pulmonary edema, left base opacity. Will complete 7 day course of Doxy. Observe.

## 2015-02-20 NOTE — Assessment & Plan Note (Signed)
Stable, continue Omeprazole 20mg daily.  

## 2015-02-20 NOTE — Assessment & Plan Note (Signed)
12/07/14 Hgb 10.4, MCV 89.3, MCH 30.1 12/21/14 Hgb 9.6 12/29/14 Hgb 9.2 02/13/15 Hgb 9.7 02/19/15 Hgb 10

## 2015-02-20 NOTE — Assessment & Plan Note (Signed)
11/22/14 Na 121 11/30/14 Na 132 12/07/14 Na 131 12/21/14 Na 137 12/29/14 Na 136 02/13/15 Na 131 developing CHF contributory.  02/19/15 Na 134.

## 2015-02-20 NOTE — Assessment & Plan Note (Signed)
Increase Lantus to 15u qd, Humalog 5u with meals, total 8u if CBG>150, 02/13/15 Hgb A1c 7.3

## 2015-02-20 NOTE — Assessment & Plan Note (Signed)
2+ edema BLE, continue Furosemide 20mg  daily and Spironolactone 25mg  daily. BNP 123 12/29/14, weight gained #6Ibs in the past 3 weeks. Monitor for s/s of developing CHF.

## 2015-02-20 NOTE — Assessment & Plan Note (Addendum)
02/17/15 CXR shwoed CHF, pulmonary edema, left base opacity. Will complete 7 day course of Doxy. Observe. Continue O2 [place], [place] prn, prednisone5mg , Diabetic Tussin prn

## 2015-02-20 NOTE — Progress Notes (Signed)
Patient ID: [name], female   DOB: [birth year], 82 y.o.   MRN: [id number]  Location:  SNF [facility] Provider:  Marlana Latus NP  Code Status:  DNR Goals of care: Advanced Directive information    Chief Complaint  Patient presents with  . Medical Management of Chronic Issues  . Acute Visit    CHF, PNA     HPI: Patient is a 82 y.o. female seen in the SNF at [facility] today for evaluation of CHF, ?PNA, increased weight and SOB, CXR 02/17/15 showed CHF, pulmonary edema, left base opacity. Hx of chronic Bronchiectasis with acute exacerbation ([place]) HCAP (healthcare-associated pneumonia), fully treated. Blood sugar is managed with Lantus 10u, am CBGs 200s mostly,  Humalog 5 u if CBG>150. Not constipation while on MiraLax prn.   Review of Systems  Constitutional: Negative for fever, chills and diaphoresis.       Baseline mobility is motorized scooter  HENT: Positive for hearing loss. Negative for congestion, ear discharge and ear pain.   Eyes: Negative for photophobia and pain.  Respiratory: Positive for cough and shortness of breath. Negative for sputum production and wheezing.   Cardiovascular: Positive for leg swelling. Negative for chest pain and palpitations.       2+,  Right knee pain.   Gastrointestinal: Positive for constipation. Negative for nausea, vomiting, abdominal pain, diarrhea and melena.  Genitourinary: Positive for frequency. Negative for dysuria and urgency.       Recurrent urinary tract infections. Nocturia. Urinary frequency.  Musculoskeletal: Negative for myalgias, back pain and neck pain.       R knee pain  Skin: Negative for rash.       Healing wound of the left hand dorsum near the thumb where a lesion was removed by Dr. Redmond Pulling at the [place] [facility].  Neurological: Negative for dizziness, tremors, seizures, weakness and headaches.  Endo/Heme/Allergies: Negative for environmental allergies.  Psychiatric/Behavioral: Negative for suicidal ideas  and hallucinations. The patient is not nervous/anxious.     Past Medical History  Diagnosis Date  . Type II or unspecified type diabetes mellitus without mention of complication, not stated as uncontrolled   . Hypertension   . Arthritis   . COPD (chronic obstructive pulmonary disease) ([place])   . Regional enteritis of unspecified site   . Other malaise and fatigue   . Anemia, unspecified   . Other and unspecified hyperlipidemia   . Hypoxemia   . Pneumonia, organism unspecified   . Mucopurulent chronic bronchitis ([place])   . Palpitations   . Nontoxic uninodular goiter   . Abnormality of gait   . Fall from other slipping, tripping, or stumbling   . Cholelithiases   . Closed fracture of unspecified part of ramus of mandible   . Disturbance of skin sensation   . Sciatica   . Tension headache   . Coronary atherosclerosis of unspecified type of vessel, native or graft   . Osteoarthrosis, unspecified whether generalized or localized, unspecified site   . Insomnia, unspecified   . Edema   . Tension headache   . Pain in joint, lower leg 2010    right knee  . Unspecified venous (peripheral) insufficiency   . Esophageal reflux   . Rosacea   . Cataract 1990    Dr. Katy Fitch  . Pain in joint, shoulder region 2013    left  . Gastric ulcer with hemorrhage 2008  . Urine, incontinence, stress female 07/19/2013  . Bronchiectasis without acute exacerbation ([place])  04/10/2011    CT chest 2011   . DOE (dyspnea on exertion) 04/10/2011    PFTs 2013:  FEV1 1.04 (78%), ratio 64, couldn't do maneuver for lung volumes or dlco   . Nodule of neck 11/07/2013    Left neck posteriorly   . Squamous cell carcinoma of skin of lower limb, including hip 07/27/2012    Nonhealing lesion of the left mid calf medially   . Tachycardia 12/06/2014    Patient Active Problem List   Diagnosis Date Noted  . Chronic combined systolic and diastolic congestive heart failure ([place]) 12/08/2014  . Anemia of chronic disease 12/08/2014   . Tachycardia 12/06/2014  . Constipation 12/05/2014  . Hyponatremia 11/24/2014  . Infection of urinary tract 06/23/2014  . Insomnia 11/21/2013  . Nodule of neck 11/07/2013  . Palpitations 09/13/2013  . Nocturia 09/13/2013  . Hyperlipidemia 07/19/2013  . Urine, incontinence, stress female 07/19/2013  . Type 2 diabetes with decreased circulation ([place]) 05/24/2013  . Edema 05/24/2013  . Esophageal reflux   . Pain in joint, shoulder region 07/27/2012  . Hypertension   . Arthritis   . Other malaise and fatigue   . Mucopurulent chronic bronchitis ([place])   . Pain in joint, lower leg   . COPD (chronic obstructive pulmonary disease) ([place]) 02/06/2012  . DOE (dyspnea on exertion) 04/10/2011  . Bronchiectasis without acute exacerbation ([place]) 04/10/2011  . Pulmonary nodules 04/10/2011    Allergies  Allergen Reactions  . Adhesive [Tape]     unknown  . Aspirin     unknown  . Codeine     unknown  . Enalapril Cough  . Hydrocodone     unknown  . Pantoprazole Sodium     unknown    Medications: Patient's Medications  New Prescriptions   No medications on file  Previous Medications   ACETAMINOPHEN (TYLENOL) 325 MG TABLET    Take 650 mg by mouth every 6 (six) hours as needed for moderate pain or headache.   ACETAMINOPHEN (TYLENOL) 500 MG TABLET    Take 500 mg by mouth 2 (two) times daily.    ATORVASTATIN (LIPITOR) 10 MG TABLET    Take 10 mg by mouth daily.   BUDESONIDE (PULMICORT) 1 MG/2ML NEBULIZER SOLUTION    Take 1 mg by nebulization 2 (two) times daily. Inhale every 12 hours if needed for dyspnea   CALCIUM CARBONATE ANTACID (CALCIUM ANTACID PO)    Take 600 mg by mouth 3 (three) times daily as needed (heartburn).   CLONIDINE (CATAPRES) 0.1 MG TABLET    Take 0.1 mg by mouth. Take one tablet as needed for bp > 160/100   DOXAZOSIN (CARDURA) 8 MG TABLET    Take 8 mg by mouth daily. To control BP.   FUROSEMIDE (LASIX) 20 MG TABLET    Take 20 mg by mouth daily.   GLUCOSE BLOOD TEST STRIP     Use three time daily to test blood sugar. Contour Test Strips. Dx:E11.9   GUAIFENESIN (ROBITUSSIN) 100 MG/5ML LIQUID    Take 200 mg by mouth. Take 10 ml every 6 hours as needed for cough   INSULIN GLARGINE (LANTUS) 100 UNIT/ML INJECTION    Inject 10 Units into the skin at bedtime.    INSULIN LISPRO (HUMALOG) 100 UNIT/ML CARTRIDGE    Inject 5-8 Units into the skin 3 (three) times daily before meals. Scheduled 5 units TID if blood sugar level is about 150 give an additional 3 units= 8units   IPRATROPIUM-ALBUTEROL (DUONEB) 0.5-2.5 (3) MG/3ML  SOLN    Take 3 mLs by nebulization 2 (two) times daily. Inhale every 12 hours as needed for dyspnea   LOSARTAN (COZAAR) 50 MG TABLET    Take 50 mg by mouth daily.   OMEPRAZOLE (PRILOSEC) 20 MG CAPSULE    Take 20 mg by mouth daily.    PROMETHAZINE (PHENERGAN) 25 MG TABLET    Take 25 mg by mouth every 4 (four) hours as needed for nausea or vomiting.   SPIRONOLACTONE (ALDACTONE) 25 MG TABLET      Modified Medications   No medications on file  Discontinued Medications   No medications on file    Physical Exam: Filed Vitals:   02/20/15 1256  BP: 140/60  Pulse: 82  Temp: 98.2 F (36.8 C)  TempSrc: Tympanic  Resp: 20   There is no weight on file to calculate BMI.  Physical Exam  Constitutional: She is oriented to person, place, and time. She appears well-developed and well-nourished. No distress.  HENT:  Head: Normocephalic and atraumatic.  Right Ear: External ear normal.  Left Ear: External ear normal.  Nose: Nose normal.  Eyes: Conjunctivae and EOM are normal. Pupils are equal, round, and reactive to light.  Neck: Neck supple. No JVD present. No tracheal deviation present. No thyromegaly present.  Cardiovascular: Normal rate, regular rhythm, normal heart sounds and intact distal pulses.  Exam reveals no gallop and no friction rub.   No murmur heard. Pulmonary/Chest: No respiratory distress. She has no wheezes. She has rales.  Moist rales  posterior mid to lower lungs  Abdominal: Bowel sounds are normal. She exhibits no distension and no mass. There is no tenderness.  Musculoskeletal: She exhibits edema (2-3+ bipedal) and tenderness.  Scars from bilateral TKR. Right knee seems to be loose when stressing the joint. No effusion. Instable gait. Driving a power scooter 2+ edema BLE Chronic R knee pain  Lymphadenopathy:    She has no cervical adenopathy.  Neurological: She is alert and oriented to person, place, and time. She has normal reflexes. No cranial nerve deficit. Coordination normal.  Diminished vibratory and monofilament testing in both feet.  Skin: No rash (Reddened rash on both legs. Some itching and burning associated with the rash.) noted. No erythema. No pallor.  Prior exam: 6 mm nodule of the left posterior neck at the SCM muscle seems resolved Likely SCC left leg medial to shin.  Psychiatric: She has a normal mood and affect. Her behavior is normal. Judgment and thought content normal.    Labs reviewed: Basic Metabolic Panel:  Recent Labs  11/27/14 0540 11/28/14 0550 11/29/14 0615  12/29/14 02/13/15 02/19/15  NA 128* 128* 129*  < > 136* 131* 134*  K 5.3* 5.0 4.8  < > 4.3 4.7 4.2  CL 91* 90* 92*  --   --   --   --   CO2 29 30 29   --   --   --   --   GLUCOSE 188* 277* 216*  --   --   --   --   BUN 31* 35* 27*  < > 19 16 17   CREATININE 0.76 0.87 0.78  < > 0.7 0.6 0.6  CALCIUM 8.7* 8.6* 8.9  --   --   --   --   < > = values in this interval not displayed.  Liver Function Tests:  Recent Labs  11/24/14 1013 12/07/14 02/13/15  AST 23 12* 10*  ALT 24 11 8   ALKPHOS 102 77 68  BILITOT 0.5  --   --   PROT 6.3*  --   --   ALBUMIN 2.8*  --   --     CBC:  Recent Labs  11/24/14 1013  11/27/14 0812 11/28/14 0550 11/29/14 0615  12/29/14 02/13/15 02/19/15  WBC 17.9*  < > 21.1* 17.4* 16.6*  < > 8.5 9.0 7.5  NEUTROABS 14.8*  --   --   --   --   --   --   --   --   HGB 11.8*  < > 13.2 12.6 13.0  < >  9.2* 9.7* 10.0*  HCT 35.1*  < > 38.7 37.7 39.2  < > 29* 32* 32*  MCV 90.7  < > 90.4 91.1 91.0  --   --   --   --   PLT 263  < > 274 251 245  < > 279 215 193  < > = values in this interval not displayed.  Lab Results  Component Value Date   TSH 0.82 02/13/2015   Lab Results  Component Value Date   HGBA1C 7.3 02/13/2015   Lab Results  Component Value Date   CHOL 108 10/27/2013   HDL 54 10/27/2013   LDLCALC 33 10/27/2013   TRIG 103 10/27/2013    Significant Diagnostic Results since last visit: none  Patient Care Team: Estill Dooms, MD as PCP - General (Internal Medicine) Clent Jacks, MD (Ophthalmology) Adrian Prows, MD as Attending Physician (Cardiology) [facility] [name], MD as Consulting Physician (Orthopedic Surgery) Kathee Delton, MD as Consulting Physician (Pulmonary Disease)  Assessment/Plan Problem List Items Addressed This Visit    Type 2 diabetes with decreased circulation ([place]) - Primary (Chronic)    Increase Lantus to 15u qd, Humalog 5u with meals, total 8u if CBG>150, 02/13/15 Hgb A1c 7.3      Hyponatremia    11/22/14 Na 121 11/30/14 Na 132 12/07/14 Na 131 12/21/14 Na 137 12/29/14 Na 136 02/13/15 Na 131 developing CHF contributory.  02/19/15 Na 134.       Hypertension (Chronic)     controlled, continue Cardura 8mg  daily, Metoprolol 25mg  bid, Losartan 50mg  and prn Clonidine 0.1mg  fro Bp>160/100 - On lasix 20mg  and resumed 02/16/15 Spironolactone 25mg  daily, 2+BLE edema - Continue statin       Esophageal reflux    Stable, continue Omeprazole 20mg  daily       Edema (Chronic)    2+ edema BLE, continue Furosemide 20mg  daily and Spironolactone 25mg  daily. BNP 123 12/29/14, weight gained #6Ibs in the past 3 weeks. Monitor for s/s of developing CHF.      COPD (chronic obstructive pulmonary disease) (HCC) (Chronic)    02/17/15 CXR shwoed CHF, pulmonary edema, left base opacity. Will complete 7 day course of Doxy. Observe. Continue O2 [place], [place] prn,  prednisone5mg , Diabetic Tussin prn       Chronic combined systolic and diastolic congestive heart failure ([place])    12/07/14 BNP 188, Na 131 12/21/14 BNP198.1 Na 137 12/29/14 BNP 123 02/13/15 BNP 195.4 02/20/15 BNP 154.5 Weight gain #6 ibs in the past 3 weeks, noted increased DOE, BLE edema, continue Furosemide 20mg  daily, Spironolactone 25mg , monitor for s/s of developing CHF. 02/17/15 CXR shwoed CHF, pulmonary edema, left base opacity. Will complete 7 day course of Doxy. Observe.       Anemia of chronic disease    12/07/14 Hgb 10.4, MCV 89.3, MCH 30.1 12/21/14 Hgb 9.6 12/29/14 Hgb 9.2 02/13/15 Hgb 9.7 02/19/15 Hgb  10          Family/ staff Communication: monitor for respiratory symptoms.   Labs/tests ordered: Labs and CXR done 02/17/15  [facility] [place] [facility] Kaio Kuhlman NP Geriatrics [facility] [address], [place] [address] On Call:  [phone] & follow prompts after 5pm & weekends Office Phone:  [phone] Office Fax:  [phone]

## 2015-03-12 DIAGNOSIS — R05 Cough: Secondary | ICD-10-CM | POA: Diagnosis not present

## 2015-03-13 ENCOUNTER — Non-Acute Institutional Stay (SKILLED_NURSING_FACILITY): Payer: Medicare Other | Admitting: Nurse Practitioner

## 2015-03-13 ENCOUNTER — Encounter: Payer: Self-pay | Admitting: Nurse Practitioner

## 2015-03-13 DIAGNOSIS — I5042 Chronic combined systolic (congestive) and diastolic (congestive) heart failure: Secondary | ICD-10-CM | POA: Diagnosis not present

## 2015-03-13 DIAGNOSIS — J189 Pneumonia, unspecified organism: Secondary | ICD-10-CM

## 2015-03-13 DIAGNOSIS — E1151 Type 2 diabetes mellitus with diabetic peripheral angiopathy without gangrene: Secondary | ICD-10-CM

## 2015-03-13 DIAGNOSIS — R609 Edema, unspecified: Secondary | ICD-10-CM | POA: Diagnosis not present

## 2015-03-13 DIAGNOSIS — K219 Gastro-esophageal reflux disease without esophagitis: Secondary | ICD-10-CM

## 2015-03-13 DIAGNOSIS — D638 Anemia in other chronic diseases classified elsewhere: Secondary | ICD-10-CM

## 2015-03-13 DIAGNOSIS — E871 Hypo-osmolality and hyponatremia: Secondary | ICD-10-CM | POA: Diagnosis not present

## 2015-03-13 DIAGNOSIS — I10 Essential (primary) hypertension: Secondary | ICD-10-CM

## 2015-03-13 DIAGNOSIS — J410 Simple chronic bronchitis: Secondary | ICD-10-CM | POA: Diagnosis not present

## 2015-03-13 NOTE — Progress Notes (Signed)
Patient ID: [name], female   DOB: 24-Dec-1917, 82 y.o.   MRN: [id number]  Location:  SNF [facility] Provider:  Marlana Latus NP  Code Status:  DNR Goals of care: Advanced Directive information  Chief Complaint  Patient presents with  . Acute Visit    COPD,PNEUMONIA     HPI: Patient is a 82 y.o. female seen in the SNF at [facility] today for evaluation of lethargy, weakness, unable to walk to bathroom, labored breathing, congested cough, CXR2/6/17 left mid to lower lung possibly pneumonia.  CXR 02/17/15 showed CHF, pulmonary edema, left base opacity. Hx of chronic Bronchiectasis with acute exacerbation ([place]) HCAP (healthcare-associated pneumonia), fully treated. Blood sugar is managed with Lantus 10u, am CBGs 200s mostly,  Humalog 5 u if CBG>150. Not constipation while on MiraLax prn.   Review of Systems  Constitutional: Positive for malaise/fatigue. Negative for fever, chills and diaphoresis.       Baseline mobility is motorized scooter  HENT: Positive for hearing loss. Negative for congestion, ear discharge and ear pain.   Eyes: Negative for photophobia and pain.  Respiratory: Positive for cough and shortness of breath. Negative for sputum production and wheezing.   Cardiovascular: Positive for leg swelling. Negative for chest pain and palpitations.       2+,  Right knee pain.   Gastrointestinal: Positive for constipation. Negative for nausea, vomiting, abdominal pain, diarrhea and melena.  Genitourinary: Positive for frequency. Negative for dysuria and urgency.       Recurrent urinary tract infections. Nocturia. Urinary frequency.  Musculoskeletal: Negative for myalgias, back pain and neck pain.       R knee pain  Skin: Negative for rash.       Healing wound of the left hand dorsum near the thumb where a lesion was removed by Dr. Redmond Pulling at the [place] [facility].  Neurological: Negative for dizziness, tremors, seizures, weakness and headaches.    Endo/Heme/Allergies: Negative for environmental allergies.  Psychiatric/Behavioral: Negative for suicidal ideas and hallucinations. The patient is not nervous/anxious.     Past Medical History  Diagnosis Date  . Type II or unspecified type diabetes mellitus without mention of complication, not stated as uncontrolled   . Hypertension   . Arthritis   . COPD (chronic obstructive pulmonary disease) ([place])   . Regional enteritis of unspecified site   . Other malaise and fatigue   . Anemia, unspecified   . Other and unspecified hyperlipidemia   . Hypoxemia   . Pneumonia, organism unspecified   . Mucopurulent chronic bronchitis ([place])   . Palpitations   . Nontoxic uninodular goiter   . Abnormality of gait   . Fall from other slipping, tripping, or stumbling   . Cholelithiases   . Closed fracture of unspecified part of ramus of mandible   . Disturbance of skin sensation   . Sciatica   . Tension headache   . Coronary atherosclerosis of unspecified type of vessel, native or graft   . Osteoarthrosis, unspecified whether generalized or localized, unspecified site   . Insomnia, unspecified   . Edema   . Tension headache   . Pain in joint, lower leg 2010    right knee  . Unspecified venous (peripheral) insufficiency   . Esophageal reflux   . Rosacea   . Cataract 1990    Dr. Katy Fitch  . Pain in joint, shoulder region 2013    left  . Gastric ulcer with hemorrhage 2008  . Urine, incontinence, stress female  07/19/2013  . Bronchiectasis without acute exacerbation ([place]) 04/10/2011    CT chest 2011   . DOE (dyspnea on exertion) 04/10/2011    PFTs 2013:  FEV1 1.04 (78%), ratio 64, couldn't do maneuver for lung volumes or dlco   . Nodule of neck 11/07/2013    Left neck posteriorly   . Squamous cell carcinoma of skin of lower limb, including hip 07/27/2012    Nonhealing lesion of the left mid calf medially   . Tachycardia 12/06/2014    Patient Active Problem List   Diagnosis Date Noted  .  Pneumonia involving left lung 03/13/2015  . Chronic combined systolic and diastolic congestive heart failure ([place]) 12/08/2014  . Anemia of chronic disease 12/08/2014  . Tachycardia 12/06/2014  . Constipation 12/05/2014  . Hyponatremia 11/24/2014  . Infection of urinary tract 06/23/2014  . Insomnia 11/21/2013  . Nodule of neck 11/07/2013  . Palpitations 09/13/2013  . Nocturia 09/13/2013  . Hyperlipidemia 07/19/2013  . Urine, incontinence, stress female 07/19/2013  . Type 2 diabetes with decreased circulation ([place]) 05/24/2013  . Edema 05/24/2013  . Esophageal reflux   . Pain in joint, shoulder region 07/27/2012  . Hypertension   . Arthritis   . Other malaise and fatigue   . Mucopurulent chronic bronchitis ([place])   . Pain in joint, lower leg   . COPD (chronic obstructive pulmonary disease) ([place]) 02/06/2012  . DOE (dyspnea on exertion) 04/10/2011  . Bronchiectasis without acute exacerbation ([place]) 04/10/2011  . Pulmonary nodules 04/10/2011    Allergies  Allergen Reactions  . Adhesive [Tape]     unknown  . Aspirin     unknown  . Codeine     unknown  . Enalapril Cough  . Hydrocodone     unknown  . Pantoprazole Sodium     unknown    Medications: Patient's Medications  New Prescriptions   No medications on file  Previous Medications   ACETAMINOPHEN (TYLENOL) 325 MG TABLET    Take 650 mg by mouth every 6 (six) hours as needed for moderate pain or headache.   ACETAMINOPHEN (TYLENOL) 500 MG TABLET    Take 500 mg by mouth 2 (two) times daily.    ATORVASTATIN (LIPITOR) 10 MG TABLET    Take 10 mg by mouth daily.   BUDESONIDE (PULMICORT) 1 MG/2ML NEBULIZER SOLUTION    Take 1 mg by nebulization 2 (two) times daily. Inhale every 12 hours if needed for dyspnea   CLONIDINE (CATAPRES) 0.1 MG TABLET    Take 0.1 mg by mouth. Take one tablet as needed for bp > 160/100   DOXAZOSIN (CARDURA) 8 MG TABLET    Take 8 mg by mouth daily. To control BP.   FUROSEMIDE (LASIX) 20 MG TABLET    Take  20 mg by mouth daily.   GLUCOSE BLOOD TEST STRIP    Use three time daily to test blood sugar. Contour Test Strips. Dx:E11.9   GUAIFENESIN (ROBITUSSIN) 100 MG/5ML LIQUID    Take 200 mg by mouth. Take 10 ml every 6 hours as needed for cough   INSULIN GLARGINE (LANTUS) 100 UNIT/ML INJECTION    Inject 15 Units into the skin at bedtime.    INSULIN LISPRO (HUMALOG) 100 UNIT/ML CARTRIDGE    Inject 5-8 Units into the skin 3 (three) times daily before meals. Scheduled 5 units TID if blood sugar level is about 150 give an additional 3 units= 8units   IPRATROPIUM-ALBUTEROL (DUONEB) 0.5-2.5 (3) MG/3ML SOLN    Take 3 mLs by  nebulization 2 (two) times daily. Inhale every 12 hours as needed for dyspnea   LOSARTAN (COZAAR) 50 MG TABLET    Take 50 mg by mouth daily.   METOPROLOL TARTRATE (LOPRESSOR) 25 MG TABLET    Take 25 mg by mouth 2 (two) times daily.   OMEPRAZOLE (PRILOSEC) 20 MG CAPSULE    Take 20 mg by mouth daily.    POLYETHYLENE GLYCOL (MIRALAX / GLYCOLAX) PACKET    Take 17 g by mouth daily as needed.   PREDNISONE (DELTASONE) 5 MG TABLET    Take 5 mg by mouth daily.   PROMETHAZINE (PHENERGAN) 25 MG TABLET    Take 25 mg by mouth every 4 (four) hours as needed for nausea or vomiting.   PROMETHAZINE (PHENERGAN) 25 MG/ML INJECTION    INJECT 1 ML EVERY FOUR HOURS AS NEEDED FOR NAUSEA IF ORAL IS REFUSED   SPIRONOLACTONE (ALDACTONE) 25 MG TABLET      Modified Medications   No medications on file  Discontinued Medications   CALCIUM CARBONATE ANTACID (CALCIUM ANTACID PO)    Take 600 mg by mouth 3 (three) times daily as needed (heartburn).    Physical Exam: Filed Vitals:   03/13/15 1149  BP: 161/72  Pulse: 68  Temp: 97.7 F (36.5 C)  TempSrc: Oral  Resp: 18  Height: 5\' 4"  (1.626 m)  Weight: 196 lb (88.905 kg)  SpO2: 95%   Body mass index is 33.63 kg/(m^2).  Physical Exam  Constitutional: She is oriented to person, place, and time. She appears well-developed and well-nourished. No distress.  HENT:    Head: Normocephalic and atraumatic.  Right Ear: External ear normal.  Left Ear: External ear normal.  Nose: Nose normal.  Eyes: Conjunctivae and EOM are normal. Pupils are equal, round, and reactive to light.  Neck: Neck supple. No JVD present. No tracheal deviation present. No thyromegaly present.  Cardiovascular: Normal rate, regular rhythm, normal heart sounds and intact distal pulses.  Exam reveals no gallop and no friction rub.   No murmur heard. Pulmonary/Chest: No respiratory distress. She has wheezes. She has rales.  Moist rales posterior mid to lower lungs  Abdominal: Bowel sounds are normal. She exhibits no distension and no mass. There is no tenderness.  Musculoskeletal: She exhibits edema (2-3+ bipedal) and tenderness.  Scars from bilateral TKR. Right knee seems to be loose when stressing the joint. No effusion. Instable gait. Driving a power scooter 2+ edema BLE Chronic R knee pain  Lymphadenopathy:    She has no cervical adenopathy.  Neurological: She is alert and oriented to person, place, and time. She has normal reflexes. No cranial nerve deficit. Coordination normal.  Diminished vibratory and monofilament testing in both feet.  Skin: No rash (Reddened rash on both legs. Some itching and burning associated with the rash.) noted. No erythema. No pallor.  Prior exam: 6 mm nodule of the left posterior neck at the SCM muscle seems resolved Likely SCC left leg medial to shin.  Psychiatric: She has a normal mood and affect. Her behavior is normal. Judgment and thought content normal.    Labs reviewed: Basic Metabolic Panel:  Recent Labs  11/27/14 0540 11/28/14 0550 11/29/14 0615  12/29/14 02/13/15 02/19/15  NA 128* 128* 129*  < > 136* 131* 134*  K 5.3* 5.0 4.8  < > 4.3 4.7 4.2  CL 91* 90* 92*  --   --   --   --   CO2 29 30 29   --   --   --   --  GLUCOSE 188* 277* 216*  --   --   --   --   BUN 31* 35* 27*  < > 19 16 17   CREATININE 0.76 0.87 0.78  < > 0.7 0.6 0.6   CALCIUM 8.7* 8.6* 8.9  --   --   --   --   < > = values in this interval not displayed.  Liver Function Tests:  Recent Labs  11/24/14 1013 12/07/14 02/13/15  AST 23 12* 10*  ALT 24 11 8   ALKPHOS 102 77 68  BILITOT 0.5  --   --   PROT 6.3*  --   --   ALBUMIN 2.8*  --   --     CBC:  Recent Labs  11/24/14 1013  11/27/14 0812 11/28/14 0550 11/29/14 0615  12/29/14 02/13/15 02/19/15  WBC 17.9*  < > 21.1* 17.4* 16.6*  < > 8.5 9.0 7.5  NEUTROABS 14.8*  --   --   --   --   --   --   --   --   HGB 11.8*  < > 13.2 12.6 13.0  < > 9.2* 9.7* 10.0*  HCT 35.1*  < > 38.7 37.7 39.2  < > 29* 32* 32*  MCV 90.7  < > 90.4 91.1 91.0  --   --   --   --   PLT 263  < > 274 251 245  < > 279 215 193  < > = values in this interval not displayed.  Lab Results  Component Value Date   TSH 0.82 02/13/2015   Lab Results  Component Value Date   HGBA1C 7.3 02/13/2015   Lab Results  Component Value Date   CHOL 108 10/27/2013   HDL 54 10/27/2013   LDLCALC 33 10/27/2013   TRIG 103 10/27/2013    Significant Diagnostic Results since last visit: none  Patient Care Team: Estill Dooms, MD as PCP - General (Internal Medicine) Clent Jacks, MD (Ophthalmology) Adrian Prows, MD as Attending Physician (Cardiology) [facility] [name], MD as Consulting Physician (Orthopedic Surgery) Kathee Delton, MD as Consulting Physician (Pulmonary Disease)  Assessment/Plan Problem List Items Addressed This Visit    COPD (chronic obstructive pulmonary disease) (HCC) (Chronic)    Continue O2 [place], [place] prn, prednisone5mg , Diabetic Tussin prn       Relevant Medications   predniSONE (DELTASONE) 5 MG tablet   promethazine (PHENERGAN) 25 MG/ML injection   Hypertension (Chronic)    - controlled, continue Cardura 8mg  daily, Metoprolol 25mg  bid, Losartan 50mg  and prn Clonidine 0.1mg  fro Bp>160/100 - On lasix 20mg  and resumed 02/16/15 Spironolactone 25mg  daily, chronic BLE edema - Continue statin        Relevant Medications   metoprolol tartrate (LOPRESSOR) 25 MG tablet   Type 2 diabetes with decreased circulation (HCC) (Chronic)    Continue Lantus 15u qd, Humalog 5u with meals, total 8u if CBG>150, 02/13/15 Hgb A1c 7.3      Edema (Chronic)    chronic edema BLE, continue Furosemide 20mg  daily and Spironolactone 25mg  daily. BNP 123 12/29/14.  Monitor for s/s of developing CHF.      Esophageal reflux    Stable, continue Omeprazole 20mg  daily       Relevant Medications   polyethylene glycol (MIRALAX / GLYCOLAX) packet   Hyponatremia    02/19/15 Na 134. Repeat CMP       Chronic combined systolic and diastolic congestive heart failure (HCC)    continue Furosemide 20mg   daily, Spironolactone 25mg , monitor for s/s of decompensation of CHF. 02/20/15 BNP 154.5. Update CMP       Relevant Medications   metoprolol tartrate (LOPRESSOR) 25 MG tablet   Anemia of chronic disease    12/07/14 Hgb 10.4, MCV 89.3, MCH 30.1 12/21/14 Hgb 9.6 12/29/14 Hgb 9.2 02/13/15 Hgb 9.7 02/19/15 Hgb 10 03/13/15 update CBC      Pneumonia involving left lung - Primary     lethargy, weakness, unable to walk to bathroom, labored breathing, congested cough, CXR2/6/17 left mid to lower lung possibly pneumonia. Avelox 400mg  daily x total 10 days and Prednisone taper dose, 40mg  qd x3, 30mg  qd x3, 20mg  qd x3, 10mg  qd x3, then dc. Continue 5mg  as prior.       Relevant Medications   promethazine (PHENERGAN) 25 MG/ML injection       Family/ staff Communication: monitor for respiratory symptoms.   Labs/tests ordered: CXR done 03/12/15, CBC, CMP in am  [facility] [place]leo NP [facility] [address], [place] [address] On Call:  [phone] & follow prompts after 5pm & weekends Office Phone:  [phone] Office Fax:  [phone]

## 2015-03-13 NOTE — Assessment & Plan Note (Signed)
Stable, continue Omeprazole 20mg daily.  

## 2015-03-13 NOTE — Assessment & Plan Note (Signed)
02/19/15 Na 134. Repeat CMP

## 2015-03-13 NOTE — Assessment & Plan Note (Signed)
Continue Lantus 15u qd, Humalog 5u with meals, total 8u if CBG>150, 02/13/15 Hgb A1c 7.3

## 2015-03-13 NOTE — Assessment & Plan Note (Signed)
lethargy, weakness, unable to walk to bathroom, labored breathing, congested cough, CXR2/6/17 left mid to lower lung possibly pneumonia. Avelox 400mg  daily x total 10 days and Prednisone taper dose, 40mg  qd x3, 30mg  qd x3, 20mg  qd x3, 10mg  qd x3, then dc. Continue 5mg  as prior.

## 2015-03-13 NOTE — Assessment & Plan Note (Signed)
chronic edema BLE, continue Furosemide 20mg  daily and Spironolactone 25mg  daily. BNP 123 12/29/14.  Monitor for s/s of developing CHF.

## 2015-03-13 NOTE — Assessment & Plan Note (Signed)
Continue O2 [place], [place] prn, prednisone5mg , Diabetic Tussin prn

## 2015-03-13 NOTE — Assessment & Plan Note (Signed)
-   controlled, continue Cardura 8mg  daily, Metoprolol 25mg  bid, Losartan 50mg  and prn Clonidine 0.1mg  fro Bp>160/100 - On lasix 20mg  and resumed 02/16/15 Spironolactone 25mg  daily, chronic BLE edema - Continue statin

## 2015-03-13 NOTE — Assessment & Plan Note (Signed)
continue Furosemide 20mg  daily, Spironolactone 25mg , monitor for s/s of decompensation of CHF. 02/20/15 BNP 154.5. Update CMP

## 2015-03-13 NOTE — Progress Notes (Signed)
Patient ID: [name], female   DOB: 22-Nov-1917, 82 y.o.   MRN: [id number]

## 2015-03-13 NOTE — Assessment & Plan Note (Signed)
12/07/14 Hgb 10.4, MCV 89.3, MCH 30.1 12/21/14 Hgb 9.6 12/29/14 Hgb 9.2 02/13/15 Hgb 9.7 02/19/15 Hgb 10 03/13/15 update CBC

## 2015-03-14 DIAGNOSIS — D688 Other specified coagulation defects: Secondary | ICD-10-CM | POA: Diagnosis not present

## 2015-03-14 DIAGNOSIS — I5042 Chronic combined systolic (congestive) and diastolic (congestive) heart failure: Secondary | ICD-10-CM | POA: Diagnosis not present

## 2015-03-15 LAB — BASIC METABOLIC PANEL
BUN: 22 mg/dL — AB (ref 4–21)
CREATININE: 0.7 mg/dL (ref 0.5–1.1)
Glucose: 161 mg/dL
POTASSIUM: 4.7 mmol/L (ref 3.4–5.3)
SODIUM: 129 mmol/L — AB (ref 137–147)

## 2015-03-15 LAB — HEPATIC FUNCTION PANEL
ALK PHOS: 85 U/L (ref 25–125)
ALT: 12 U/L (ref 7–35)
AST: 20 U/L (ref 13–35)
Bilirubin, Total: 0.3 mg/dL

## 2015-03-15 LAB — CBC AND DIFFERENTIAL
HCT: 35 % — AB (ref 36–46)
Hemoglobin: 10.7 g/dL — AB (ref 12.0–16.0)
PLATELETS: 220 10*3/uL (ref 150–399)
WBC: 11.6 10^3/mL

## 2015-03-20 ENCOUNTER — Encounter: Payer: Self-pay | Admitting: Nurse Practitioner

## 2015-03-20 ENCOUNTER — Non-Acute Institutional Stay (SKILLED_NURSING_FACILITY): Payer: Medicare Other | Admitting: Nurse Practitioner

## 2015-03-20 DIAGNOSIS — K219 Gastro-esophageal reflux disease without esophagitis: Secondary | ICD-10-CM

## 2015-03-20 DIAGNOSIS — R609 Edema, unspecified: Secondary | ICD-10-CM

## 2015-03-20 DIAGNOSIS — J410 Simple chronic bronchitis: Secondary | ICD-10-CM

## 2015-03-20 DIAGNOSIS — I10 Essential (primary) hypertension: Secondary | ICD-10-CM

## 2015-03-20 DIAGNOSIS — E871 Hypo-osmolality and hyponatremia: Secondary | ICD-10-CM | POA: Diagnosis not present

## 2015-03-20 DIAGNOSIS — E1151 Type 2 diabetes mellitus with diabetic peripheral angiopathy without gangrene: Secondary | ICD-10-CM

## 2015-03-20 DIAGNOSIS — I5042 Chronic combined systolic (congestive) and diastolic (congestive) heart failure: Secondary | ICD-10-CM | POA: Diagnosis not present

## 2015-03-20 NOTE — Assessment & Plan Note (Signed)
-   controlled, continue Cardura 8mg  daily, Metoprolol 25mg  bid, Losartan 50mg  and prn Clonidine 0.1mg  fro Bp>160/100 - On lasix 20mg  and resumed 02/16/15 Spironolactone 25mg  daily, chronic BLE edema - Continue statin

## 2015-03-20 NOTE — Assessment & Plan Note (Signed)
Continue Lantus 15u qd, increase Humalog 8u with meals, total 8u if CBG>150, 02/13/15 Hgb A1c 7.3

## 2015-03-20 NOTE — Progress Notes (Signed)
Patient ID: [name], female   DOB: Jun 16, 1917, 82 y.o.   MRN: [id number]

## 2015-03-20 NOTE — Assessment & Plan Note (Signed)
Continue O2 [place], [place] prn, prednisone5mg , Diabetic Tussin prn

## 2015-03-20 NOTE — Assessment & Plan Note (Signed)
chronic edema BLE, continue Furosemide 20mg  daily and Spironolactone 25mg  daily. BNP 123 12/29/14.  Monitor for s/s of developing CHF.

## 2015-03-20 NOTE — Assessment & Plan Note (Signed)
12/29/14 Na 136 02/13/15 Na 131 02/19/15 Na 134 03/15/15 Na 129, BMP one we CHF vs Prednisone taper dose. Monitor Na

## 2015-03-20 NOTE — Assessment & Plan Note (Signed)
continue Furosemide 20mg  daily, Spironolactone 25mg , monitor for s/s of decompensation of CHF. 02/20/15 BNP 154.5, 03/15/15 Na 129, K 4.7, Bun 22, creat 0.73

## 2015-03-20 NOTE — Progress Notes (Signed)
Patient ID: [name], female   DOB: 27-Dec-1917, 82 y.o.   MRN: [id number]  Location:  SNF [facility] Provider:  Marlana Latus NP  Code Status:  DNR Goals of care: Advanced Directive information  Chief Complaint  Patient presents with  . Acute Visit    Blood Sugar     HPI: Patient is a 82 y.o. female seen in the SNF at [facility] today for evaluation of elevated blood sugar, taking Lantus 15u, Humalog 5u with meals. treated PNA, improved,  CXR2/6/17 left mid to lower lung possibly pneumonia.  CXR 02/17/15 showed CHF, pulmonary edema, left base opacity. Hx of chronic Bronchiectasis with acute exacerbation ([place]) HCAP (healthcare-associated pneumonia), fully treated. Not constipation while on MiraLax prn.   Review of Systems  Constitutional: Positive for malaise/fatigue. Negative for fever, chills and diaphoresis.       Baseline mobility is motorized scooter  HENT: Positive for hearing loss. Negative for congestion, ear discharge and ear pain.   Eyes: Negative for photophobia and pain.  Respiratory: Positive for cough and shortness of breath. Negative for sputum production and wheezing.        Improved cough and SOB  Cardiovascular: Positive for leg swelling. Negative for chest pain and palpitations.       1+ edema BLE,  Right knee pain.   Gastrointestinal: Positive for constipation. Negative for nausea, vomiting, abdominal pain, diarrhea and melena.  Genitourinary: Positive for frequency. Negative for dysuria and urgency.       Recurrent urinary tract infections. Nocturia. Urinary frequency.  Musculoskeletal: Negative for myalgias, back pain and neck pain.       R knee pain  Skin: Negative for rash.       Healing wound of the left hand dorsum near the thumb where a lesion was removed by Dr. Redmond Pulling at the [facility].  Neurological: Negative for dizziness, tremors, seizures, weakness and headaches.  Endo/Heme/Allergies: Negative for environmental allergies.    Psychiatric/Behavioral: Negative for suicidal ideas and hallucinations. The patient is not nervous/anxious.     Past Medical History  Diagnosis Date  . Type II or unspecified type diabetes mellitus without mention of complication, not stated as uncontrolled   . Hypertension   . Arthritis   . COPD (chronic obstructive pulmonary disease) ([place])   . Regional enteritis of unspecified site   . Other malaise and fatigue   . Anemia, unspecified   . Other and unspecified hyperlipidemia   . Hypoxemia   . Pneumonia, organism unspecified   . Mucopurulent chronic bronchitis ([place])   . Palpitations   . Nontoxic uninodular goiter   . Abnormality of gait   . Fall from other slipping, tripping, or stumbling   . Cholelithiases   . Closed fracture of unspecified part of ramus of mandible   . Disturbance of skin sensation   . Sciatica   . Tension headache   . Coronary atherosclerosis of unspecified type of vessel, native or graft   . Osteoarthrosis, unspecified whether generalized or localized, unspecified site   . Insomnia, unspecified   . Edema   . Tension headache   . Pain in joint, lower leg 2010    right knee  . Unspecified venous (peripheral) insufficiency   . Esophageal reflux   . Rosacea   . Cataract 1990    Dr. Katy Fitch  . Pain in joint, shoulder region 2013    left  . Gastric ulcer with hemorrhage 2008  . Urine, incontinence, stress female 07/19/2013  .  Bronchiectasis without acute exacerbation ([place]) 04/10/2011    CT chest 2011   . DOE (dyspnea on exertion) 04/10/2011    PFTs 2013:  FEV1 1.04 (78%), ratio 64, couldn't do maneuver for lung volumes or dlco   . Nodule of neck 11/07/2013    Left neck posteriorly   . Squamous cell carcinoma of skin of lower limb, including hip 07/27/2012    Nonhealing lesion of the left mid calf medially   . Tachycardia 12/06/2014    Patient Active Problem List   Diagnosis Date Noted  . Pneumonia involving left lung 03/13/2015  . Chronic combined  systolic and diastolic congestive heart failure ([place]) 12/08/2014  . Anemia of chronic disease 12/08/2014  . Tachycardia 12/06/2014  . Constipation 12/05/2014  . Hyponatremia 11/24/2014  . Infection of urinary tract 06/23/2014  . Insomnia 11/21/2013  . Nodule of neck 11/07/2013  . Palpitations 09/13/2013  . Nocturia 09/13/2013  . Hyperlipidemia 07/19/2013  . Urine, incontinence, stress female 07/19/2013  . Type 2 diabetes with decreased circulation ([place]) 05/24/2013  . Edema 05/24/2013  . Esophageal reflux   . Pain in joint, shoulder region 07/27/2012  . Hypertension   . Arthritis   . Other malaise and fatigue   . Mucopurulent chronic bronchitis ([place])   . Pain in joint, lower leg   . COPD (chronic obstructive pulmonary disease) ([place]) 02/06/2012  . DOE (dyspnea on exertion) 04/10/2011  . Bronchiectasis without acute exacerbation ([place]) 04/10/2011  . Pulmonary nodules 04/10/2011    Allergies  Allergen Reactions  . Adhesive [Tape]     unknown  . Aspirin     unknown  . Augmentin [Amoxicillin-Pot Clavulanate]   . Codeine     unknown  . Enalapril Cough  . Hydrocodone     unknown  . Pantoprazole Sodium     unknown  . Voltaren [Diclofenac Sodium]     Medications: Patient's Medications  New Prescriptions   No medications on file  Previous Medications   ACETAMINOPHEN (TYLENOL) 325 MG TABLET    Take 650 mg by mouth every 6 (six) hours as needed for moderate pain or headache.   ACETAMINOPHEN (TYLENOL) 500 MG TABLET    Take 500 mg by mouth 2 (two) times daily.    ATORVASTATIN (LIPITOR) 10 MG TABLET    Take 10 mg by mouth daily.   BUDESONIDE (PULMICORT) 1 MG/2ML NEBULIZER SOLUTION    Take 1 mg by nebulization 2 (two) times daily. Inhale every 12 hours if needed for dyspnea   CLONIDINE (CATAPRES) 0.1 MG TABLET    Take 0.1 mg by mouth. Take one tablet as needed for bp > 160/100   DOXAZOSIN (CARDURA) 8 MG TABLET    Take 8 mg by mouth daily. To control BP.   FUROSEMIDE (LASIX) 20 MG  TABLET    Take 20 mg by mouth daily.   GLUCOSE BLOOD TEST STRIP    Use three time daily to test blood sugar. Contour Test Strips. Dx:E11.9   GUAIFENESIN (ROBITUSSIN) 100 MG/5ML LIQUID    Take 200 mg by mouth. Take 10 ml every 6 hours as needed for cough   INSULIN GLARGINE (LANTUS) 100 UNIT/ML INJECTION    Inject 15 Units into the skin at bedtime.    INSULIN LISPRO (HUMALOG) 100 UNIT/ML CARTRIDGE    Inject 5-8 Units into the skin 3 (three) times daily before meals. Scheduled 5 units TID if blood sugar level is about 150 give an additional 3 units= 8units   LOSARTAN (COZAAR) 50 MG  TABLET    Take 50 mg by mouth daily.   METOPROLOL TARTRATE (LOPRESSOR) 25 MG TABLET    Take 25 mg by mouth 2 (two) times daily.   OMEPRAZOLE (PRILOSEC) 20 MG CAPSULE    Take 20 mg by mouth daily.    POLYETHYLENE GLYCOL (MIRALAX / GLYCOLAX) PACKET    Take 17 g by mouth daily as needed.   PREDNISONE (DELTASONE) 5 MG TABLET    Take 5 mg by mouth daily.   PROMETHAZINE (PHENERGAN) 25 MG TABLET    Take 25 mg by mouth every 4 (four) hours as needed for nausea or vomiting.   PROMETHAZINE (PHENERGAN) 25 MG/ML INJECTION    INJECT 1 ML EVERY FOUR HOURS AS NEEDED FOR NAUSEA IF ORAL IS REFUSED   SPIRONOLACTONE (ALDACTONE) 25 MG TABLET      Modified Medications   No medications on file  Discontinued Medications   IPRATROPIUM-ALBUTEROL (DUONEB) 0.5-2.5 (3) MG/3ML SOLN    Take 3 mLs by nebulization 2 (two) times daily. Reported on 03/20/2015    Physical Exam: Filed Vitals:   03/20/15 1022  BP: 161/72  Pulse: 87  Temp: 97.7 F (36.5 C)  TempSrc: Oral  Resp: 18  Height: 5\' 4"  (1.626 m)  Weight: 196 lb (88.905 kg)  SpO2: 95%   Body mass index is 33.63 kg/(m^2).  Physical Exam  Constitutional: She is oriented to person, place, and time. She appears well-developed and well-nourished. No distress.  HENT:  Head: Normocephalic and atraumatic.  Right Ear: External ear normal.  Left Ear: External ear normal.  Nose: Nose normal.    Eyes: Conjunctivae and EOM are normal. Pupils are equal, round, and reactive to light.  Neck: Neck supple. No JVD present. No tracheal deviation present. No thyromegaly present.  Cardiovascular: Normal rate, regular rhythm, normal heart sounds and intact distal pulses.  Exam reveals no gallop and no friction rub.   No murmur heard. Pulmonary/Chest: No respiratory distress. She has wheezes. She has rales.  Moist rales posterior mid to lower lungs. Trace edema BLE  Abdominal: Bowel sounds are normal. She exhibits no distension and no mass. There is no tenderness.  Musculoskeletal: She exhibits edema (2-3+ bipedal) and tenderness.  Scars from bilateral TKR. Right knee seems to be loose when stressing the joint. No effusion. Instable gait. Driving a power scooter 2+ edema BLE Chronic R knee pain  Lymphadenopathy:    She has no cervical adenopathy.  Neurological: She is alert and oriented to person, place, and time. She has normal reflexes. No cranial nerve deficit. Coordination normal.  Diminished vibratory and monofilament testing in both feet.  Skin: No rash (Reddened rash on both legs. Some itching and burning associated with the rash.) noted. No erythema. No pallor.  Prior exam: 6 mm nodule of the left posterior neck at the SCM muscle seems resolved Likely SCC left leg medial to shin.  Psychiatric: She has a normal mood and affect. Her behavior is normal. Judgment and thought content normal.    Labs reviewed: Basic Metabolic Panel:  Recent Labs  11/27/14 0540 11/28/14 0550 11/29/14 0615  02/13/15 02/19/15 03/15/15  NA 128* 128* 129*  < > 131* 134* 129*  K 5.3* 5.0 4.8  < > 4.7 4.2 4.7  CL 91* 90* 92*  --   --   --   --   CO2 29 30 29   --   --   --   --   GLUCOSE 188* 277* 216*  --   --   --   --  BUN 31* 35* 27*  < > 16 17 22*  CREATININE 0.76 0.87 0.78  < > 0.6 0.6 0.7  CALCIUM 8.7* 8.6* 8.9  --   --   --   --   < > = values in this interval not displayed.  Liver Function  Tests:  Recent Labs  11/24/14 1013 12/07/14 02/13/15 03/15/15  AST 23 12* 10* 20  ALT 24 11 8 12   ALKPHOS 102 77 68 85  BILITOT 0.5  --   --   --   PROT 6.3*  --   --   --   ALBUMIN 2.8*  --   --   --     CBC:  Recent Labs  11/24/14 1013  11/27/14 0812 11/28/14 0550 11/29/14 0615  02/13/15 02/19/15 03/15/15  WBC 17.9*  < > 21.1* 17.4* 16.6*  < > 9.0 7.5 11.6  NEUTROABS 14.8*  --   --   --   --   --   --   --   --   HGB 11.8*  < > 13.2 12.6 13.0  < > 9.7* 10.0* 10.7*  HCT 35.1*  < > 38.7 37.7 39.2  < > 32* 32* 35*  MCV 90.7  < > 90.4 91.1 91.0  --   --   --   --   PLT 263  < > 274 251 245  < > 215 193 220  < > = values in this interval not displayed.  Lab Results  Component Value Date   TSH 0.82 02/13/2015   Lab Results  Component Value Date   HGBA1C 7.3 02/13/2015   Lab Results  Component Value Date   CHOL 108 10/27/2013   HDL 54 10/27/2013   LDLCALC 33 10/27/2013   TRIG 103 10/27/2013    Significant Diagnostic Results since last visit: none  Patient Care Team: Estill Dooms, MD as PCP - General (Internal Medicine) Clent Jacks, MD (Ophthalmology) Adrian Prows, MD as Attending Physician (Cardiology) [facility] [name], MD as Consulting Physician (Orthopedic Surgery) Kathee Delton, MD as Consulting Physician (Pulmonary Disease)  Assessment/Plan Problem List Items Addressed This Visit    COPD (chronic obstructive pulmonary disease) ([place]) (Chronic)    Continue O2 [place], Neb prn, prednisone5mg , Diabetic Tussin prn      Hypertension (Chronic)    - controlled, continue Cardura 8mg  daily, Metoprolol 25mg  bid, Losartan 50mg  and prn Clonidine 0.1mg  fro Bp>160/100 - On lasix 20mg  and resumed 02/16/15 Spironolactone 25mg  daily, chronic BLE edema - Continue statin       Type 2 diabetes with decreased circulation (HCC) - Primary (Chronic)    Continue Lantus 15u qd, increase Humalog 8u with meals, total 8u if CBG>150, 02/13/15 Hgb A1c 7.3      Edema  (Chronic)    chronic edema BLE, continue Furosemide 20mg  daily and Spironolactone 25mg  daily. BNP 123 12/29/14.  Monitor for s/s of developing CHF.      Esophageal reflux    Stable, continue Omeprazole 20mg  daily       Hyponatremia    12/29/14 Na 136 02/13/15 Na 131 02/19/15 Na 134 03/15/15 Na 129, BMP one we CHF vs Prednisone taper dose. Monitor Na      Chronic combined systolic and diastolic congestive heart failure (HCC)    continue Furosemide 20mg  daily, Spironolactone 25mg , monitor for s/s of decompensation of CHF. 02/20/15 BNP 154.5, 03/15/15 Na 129, K 4.7, Bun 22, creat 0.73  Family/ staff Communication: monitor blood sugar, continue SNF for care needs.   Labs/tests ordered: none  ManXie Shammond Arave NP [facility] [address], [place] [address] On Call:  [phone] & follow prompts after 5pm & weekends Office Phone:  [phone] Office Fax:  [phone]

## 2015-03-20 NOTE — Assessment & Plan Note (Signed)
Stable, continue Omeprazole 20mg daily.  

## 2015-03-22 DIAGNOSIS — I10 Essential (primary) hypertension: Secondary | ICD-10-CM | POA: Diagnosis not present

## 2015-03-22 LAB — BASIC METABOLIC PANEL
BUN: 22 mg/dL — AB (ref 4–21)
Creatinine: 0.6 mg/dL (ref 0.5–1.1)
GLUCOSE: 148 mg/dL
POTASSIUM: 4.9 mmol/L (ref 3.4–5.3)
SODIUM: 135 mmol/L — AB (ref 137–147)

## 2015-04-10 ENCOUNTER — Encounter: Payer: Self-pay | Admitting: Nurse Practitioner

## 2015-04-10 ENCOUNTER — Non-Acute Institutional Stay (SKILLED_NURSING_FACILITY): Payer: Medicare Other | Admitting: Nurse Practitioner

## 2015-04-10 DIAGNOSIS — I10 Essential (primary) hypertension: Secondary | ICD-10-CM | POA: Diagnosis not present

## 2015-04-10 DIAGNOSIS — D638 Anemia in other chronic diseases classified elsewhere: Secondary | ICD-10-CM | POA: Diagnosis not present

## 2015-04-10 DIAGNOSIS — R609 Edema, unspecified: Secondary | ICD-10-CM

## 2015-04-10 DIAGNOSIS — I5042 Chronic combined systolic (congestive) and diastolic (congestive) heart failure: Secondary | ICD-10-CM

## 2015-04-10 DIAGNOSIS — J410 Simple chronic bronchitis: Secondary | ICD-10-CM | POA: Diagnosis not present

## 2015-04-10 DIAGNOSIS — K219 Gastro-esophageal reflux disease without esophagitis: Secondary | ICD-10-CM

## 2015-04-10 DIAGNOSIS — K5900 Constipation, unspecified: Secondary | ICD-10-CM | POA: Diagnosis not present

## 2015-04-10 DIAGNOSIS — E1151 Type 2 diabetes mellitus with diabetic peripheral angiopathy without gangrene: Secondary | ICD-10-CM

## 2015-04-10 DIAGNOSIS — E871 Hypo-osmolality and hyponatremia: Secondary | ICD-10-CM | POA: Diagnosis not present

## 2015-04-10 NOTE — Assessment & Plan Note (Signed)
continue Furosemide 20mg  daily, Spironolactone 25mg , monitor for s/s of decompensation of CHF. 02/20/15 BNP 154.5, 03/15/15 Na 129, K 4.7, Bun 22, creat 0.73

## 2015-04-10 NOTE — Assessment & Plan Note (Signed)
-   controlled, continue Cardura 8mg  daily, Metoprolol 25mg  bid, Losartan 50mg  and prn Clonidine 0.1mg  fro Bp>160/100 - On lasix 20mg  and resumed 02/16/15 Spironolactone 25mg  daily, chronic BLE edema - Continue statin

## 2015-04-10 NOTE — Assessment & Plan Note (Signed)
12/07/14 Hgb 10.4, MCV 89.3, MCH 30.1 12/21/14 Hgb 9.6 12/29/14 Hgb 9.2 02/13/15 Hgb 9.7 02/19/15 Hgb 10 03/15/15 Hgb 10.7

## 2015-04-10 NOTE — Assessment & Plan Note (Addendum)
Continue O2 [place], Neb prn for wheezes, prednisone5mg , Diabetic Tussin prn

## 2015-04-10 NOTE — Assessment & Plan Note (Signed)
Stable, continue MiraLax daily.  

## 2015-04-10 NOTE — Assessment & Plan Note (Signed)
chronic edema BLE, continue Furosemide 20mg  daily and Spironolactone 25mg  daily. BNP 123 12/29/14.  Monitor for s/s of developing CHF.

## 2015-04-10 NOTE — Assessment & Plan Note (Signed)
Continue Lantus 15u qd, increase Humalog 8u with meals, total 8u if CBG>150, 02/13/15 Hgb A1c 7.3

## 2015-04-10 NOTE — Assessment & Plan Note (Signed)
12/29/14 Na 136 02/13/15 Na 131 02/19/15 Na 134 03/15/15 Na 129 03/22/15 Na 135 CHF vs Prednisone taper dose. Monitor Na

## 2015-04-10 NOTE — Progress Notes (Signed)
Patient ID: [name], female   DOB: Jun 22, 1917, 81 y.o.   MRN: [id number]  Location:  [facility] Room Number: 16 Place of Service:  SNF (31) Provider: Lennie Odor Mast NP  GREEN, Viviann Spare, MD  Patient Care Team: Estill Dooms, MD as PCP - General (Internal Medicine) Clent Jacks, MD (Ophthalmology) Adrian Prows, MD as Attending Physician (Cardiology) [facility] [name], MD as Consulting Physician (Orthopedic Surgery) Kathee Delton, MD as Consulting Physician (Pulmonary Disease)  Extended Emergency Contact Information Primary Emergency Contact: Ferraiolo,[name] Address: [address]          [place], [place] [address] [name] of [place] Phone: [phone] Mobile Phone: [phone] Relation: Son Secondary Emergency Contact: [place] Phone: [phone] Relation: Daughter  Code Status:  DNR Goals of care: Advanced Directive information Advanced Directives 04/10/2015  Does patient have an advance directive? Yes  Type of Paramedic of [place];Out of facility DNR (pink MOST or yellow form)  Does patient want to make changes to advanced directive? No - Patient declined  Copy of advanced directive(s) in chart? Yes     Chief Complaint  Patient presents with  . Medical Management of Chronic Issues    Routine Visit    HPI:  Pt is a 82 y.o. female seen today for medical management of chronic diseases.  BLE edema, minimal while on Furosemide 20mg  and Spironolactone 25mg ,  chronic Bronchiectasis, managed with Pulmicort and Prednisone 5mg , chronic cough.  Blood sugar is managed with Lantus 15u, Humalog 8 u if CBG>150. No constipation while on MiraLax prn. Blood pressure is controlled on Clonidine 0.1mg  prn, Losartan 50mg , Metoprolol 25mg  bid, Cardura 8mg .     Past Medical History  Diagnosis Date  . Type II or unspecified type diabetes mellitus without mention of complication, not  stated as uncontrolled   . Hypertension   . Arthritis   . COPD (chronic obstructive pulmonary disease) ([place])   . Regional enteritis of unspecified site   . Other malaise and fatigue   . Anemia, unspecified   . Other and unspecified hyperlipidemia   . Hypoxemia   . Pneumonia, organism unspecified   . Mucopurulent chronic bronchitis ([place])   . Palpitations   . Nontoxic uninodular goiter   . Abnormality of gait   . Fall from other slipping, tripping, or stumbling   . Cholelithiases   . Closed fracture of unspecified part of ramus of mandible   . Disturbance of skin sensation   . Sciatica   . Tension headache   . Coronary atherosclerosis of unspecified type of vessel, native or graft   . Osteoarthrosis, unspecified whether generalized or localized, unspecified site   . Insomnia, unspecified   . Edema   . Tension headache   . Pain in joint, lower leg 2010    right knee  . Unspecified venous (peripheral) insufficiency   . Esophageal reflux   . Rosacea   . Cataract 1990    Dr. Katy Fitch  . Pain in joint, shoulder region 2013    left  . Gastric ulcer with hemorrhage 2008  . Urine, incontinence, stress female 07/19/2013  . Bronchiectasis without acute exacerbation ([place]) 04/10/2011    CT chest 2011   . DOE (dyspnea on exertion) 04/10/2011    PFTs 2013:  FEV1 1.04 (78%), ratio 64, couldn't do maneuver for lung volumes or dlco   . Nodule of neck 11/07/2013    Left  neck posteriorly   . Squamous cell carcinoma of skin of lower limb, including hip 07/27/2012    Nonhealing lesion of the left mid calf medially   . Tachycardia 12/06/2014   Past Surgical History  Procedure Laterality Date  . Total knee arthroplasty Bilateral 1993, 1998    knees  . Other surgical history  2008    Ulcer surgery   . Small intestine surgery    . Eye surgery Bilateral 1990    Dr. Katy Fitch  . Gastrojejunostomy  05/2004    secondary to ulcer  . Joint replacement Bilateral 1993-1998    total knee replacement  . Mole  removal  05/11/14    left hand and left side of face [facility]    Allergies  Allergen Reactions  . Adhesive [Tape]     unknown  . Aspirin     unknown  . Augmentin [Amoxicillin-Pot Clavulanate]   . Codeine     unknown  . Diclofenac   . Enalapril Cough  . Hydrocodone     unknown  . Pantoprazole Sodium     unknown  . Voltaren [Diclofenac Sodium]       Medication List       This list is accurate as of: 04/10/15  2:55 PM.  Always use your most recent med list.               acetaminophen 500 MG tablet  Commonly known as:  TYLENOL  Take 500 mg by mouth 2 (two) times daily.     acetaminophen 325 MG tablet  Commonly known as:  TYLENOL  Take 650 mg by mouth every 6 (six) hours as needed for moderate pain or headache.     atorvastatin 10 MG tablet  Commonly known as:  LIPITOR  Take 10 mg by mouth daily.     budesonide 1 MG/2ML nebulizer solution  Commonly known as:  PULMICORT  Take 1 mg by nebulization 2 (two) times daily. Inhale every 12 hours if needed for dyspnea     cloNIDine 0.1 MG tablet  Commonly known as:  CATAPRES  Take 0.1 mg by mouth. Take one tablet as needed for bp > 160/100     doxazosin 8 MG tablet  Commonly known as:  CARDURA  Take 8 mg by mouth daily. To control BP.     furosemide 20 MG tablet  Commonly known as:  LASIX  Take 20 mg by mouth daily.     glucose blood test strip  Use three time daily to test blood sugar. Contour Test Strips. Dx:E11.9     guaiFENesin 100 MG/5ML liquid  Commonly known as:  ROBITUSSIN  Take 200 mg by mouth. Take 10 ml every 6 hours as needed for cough     HUMALOG 100 UNIT/ML cartridge  Generic drug:  insulin lispro  Inject 8 Units into the skin 3 (three) times daily with meals. For CBG >150     insulin glargine 100 UNIT/ML injection  Commonly known as:  LANTUS  Inject 15 Units into the skin at bedtime.     losartan 50 MG tablet  Commonly known as:  COZAAR  Take 50 mg by mouth daily.     metoprolol  tartrate 25 MG tablet  Commonly known as:  LOPRESSOR  Take 25 mg by mouth 2 (two) times daily.     multivitamin with minerals tablet  Take 1 tablet by mouth daily.     omeprazole 20 MG capsule  Commonly known as:  PRILOSEC  Take 20 mg by mouth daily.     polyethylene glycol packet  Commonly known as:  MIRALAX / GLYCOLAX  Take 17 g by mouth daily as needed.     predniSONE 5 MG tablet  Commonly known as:  DELTASONE  Take 5 mg by mouth daily.     promethazine 25 MG tablet  Commonly known as:  PHENERGAN  Take 25 mg by mouth every 4 (four) hours as needed for nausea or vomiting.     promethazine 25 MG/ML injection  Commonly known as:  PHENERGAN  INJECT 1 ML EVERY FOUR HOURS AS NEEDED FOR NAUSEA IF ORAL IS REFUSED     spironolactone 25 MG tablet  Commonly known as:  ALDACTONE        Review of Systems  Constitutional: Negative for fever, chills and diaphoresis.       Baseline mobility is motorized scooter  HENT: Positive for hearing loss. Negative for congestion, ear discharge and ear pain.   Eyes: Negative for photophobia and pain.  Respiratory: Positive for cough, shortness of breath and wheezing.        Improved cough and SOB, mild wheezes on exertion  Cardiovascular: Positive for leg swelling. Negative for chest pain and palpitations.       1+ edema BLE,  Right knee pain.   Gastrointestinal: Positive for constipation. Negative for nausea, vomiting, abdominal pain and diarrhea.  Genitourinary: Positive for frequency and dyspareunia. Negative for dysuria and urgency.       Recurrent urinary tract infections. Nocturia. Urinary frequency.  Musculoskeletal: Negative for myalgias, back pain and neck pain.       R knee pain  Skin: Negative for rash.       Healing wound of the left hand dorsum near the thumb where a lesion was removed by Dr. Redmond Pulling at the [place] [facility].  Allergic/Immunologic: Negative for environmental allergies.  Neurological: Negative for  dizziness, tremors, seizures, weakness and headaches.  Psychiatric/Behavioral: Negative for suicidal ideas and hallucinations. The patient is not nervous/anxious.     Immunization History  Administered Date(s) Administered  . Influenza Split 11/06/2011, 11/04/2012  . Influenza,inj,Quad PF,36+ Mos 11/17/2013  . Influenza-Unspecified 11/09/2014  . PPD Test 11/29/2014  . Pneumococcal Conjugate-13 08/11/2014  . Pneumococcal Polysaccharide-23 02/04/2003  . Tdap 02/27/2013  . Zoster 02/03/2006   Pertinent  Health Maintenance Due  Topic Date Due  . OPHTHALMOLOGY EXAM  10/31/1929  . DEXA SCAN  10/31/1984  . FOOT EXAM  11/25/2014  . HEMOGLOBIN A1C  08/13/2015  . INFLUENZA VACCINE  09/04/2015  . PNA vac Low Risk Adult  Completed   Fall Risk  12/06/2014 07/11/2014 05/22/2014 11/07/2013 08/30/2013  Falls in the past year? Yes No Yes No No  Number falls in past yr: 1 - 1 - -  Injury with Fall? No - No - -  Risk for fall due to : History of fall(s);Impaired mobility - History of fall(s);Impaired balance/gait - Impaired balance/gait;Impaired mobility  Follow up - - Falls evaluation completed - -   Functional Status Survey:    Filed Vitals:   04/10/15 0919  BP: 110/60  Pulse: 79  Temp: 98.5 F (36.9 C)  TempSrc: Oral  Resp: 24  Height: 5\' 4"  (1.626 m)  Weight: 193 lb (87.544 kg)   Body mass index is 33.11 kg/(m^2). Physical Exam  Constitutional: She is oriented to person, place, and time. She appears well-developed and well-nourished. No distress.  HENT:  Head: Normocephalic and atraumatic.  Right Ear: External ear normal.  Left Ear: External ear normal.  Nose: Nose normal.  Eyes: Conjunctivae and EOM are normal. Pupils are equal, round, and reactive to light.  Neck: Neck supple. No JVD present. No tracheal deviation present. No thyromegaly present.  Cardiovascular: Normal rate, regular rhythm, normal heart sounds and intact distal pulses.  Exam reveals no gallop and no friction rub.    No murmur heard. Pulmonary/Chest: No respiratory distress. She has wheezes. She has rales.  Moist rales posterior mid to lower lungs. Trace edema BLE. Mild exertional wheezes  Abdominal: Bowel sounds are normal. She exhibits no distension and no mass. There is no tenderness.  Musculoskeletal: She exhibits edema (2-3+ bipedal) and tenderness.  Scars from bilateral TKR. Right knee seems to be loose when stressing the joint. No effusion. Instable gait. Driving a power scooter 2+ edema BLE Chronic R knee pain  Lymphadenopathy:    She has no cervical adenopathy.  Neurological: She is alert and oriented to person, place, and time. She has normal reflexes. No cranial nerve deficit. Coordination normal.  Diminished vibratory and monofilament testing in both feet.  Skin: No rash (Reddened rash on both legs. Some itching and burning associated with the rash.) noted. No erythema. No pallor.  Prior exam: 6 mm nodule of the left posterior neck at the SCM muscle seems resolved Likely SCC left leg medial to shin.  Psychiatric: She has a normal mood and affect. Her behavior is normal. Judgment and thought content normal.    Labs reviewed:  Recent Labs  11/27/14 0540 11/28/14 0550 11/29/14 0615  02/19/15 03/15/15 03/22/15  NA 128* 128* 129*  < > 134* 129* 135*  K 5.3* 5.0 4.8  < > 4.2 4.7 4.9  CL 91* 90* 92*  --   --   --   --   CO2 29 30 29   --   --   --   --   GLUCOSE 188* 277* 216*  --   --   --   --   BUN 31* 35* 27*  < > 17 22* 22*  CREATININE 0.76 0.87 0.78  < > 0.6 0.7 0.6  CALCIUM 8.7* 8.6* 8.9  --   --   --   --   < > = values in this interval not displayed.  Recent Labs  11/24/14 1013 12/07/14 02/13/15 03/15/15  AST 23 12* 10* 20  ALT 24 11 8 12   ALKPHOS 102 77 68 85  BILITOT 0.5  --   --   --   PROT 6.3*  --   --   --   ALBUMIN 2.8*  --   --   --     Recent Labs  11/24/14 1013  11/27/14 0812 11/28/14 0550 11/29/14 0615  02/13/15 02/19/15 03/15/15  WBC 17.9*  < >  21.1* 17.4* 16.6*  < > 9.0 7.5 11.6  NEUTROABS 14.8*  --   --   --   --   --   --   --   --   HGB 11.8*  < > 13.2 12.6 13.0  < > 9.7* 10.0* 10.7*  HCT 35.1*  < > 38.7 37.7 39.2  < > 32* 32* 35*  MCV 90.7  < > 90.4 91.1 91.0  --   --   --   --   PLT 263  < > 274 251 245  < > 215 193 220  < > = values in this interval not displayed. Lab  Results  Component Value Date   TSH 0.82 02/13/2015   Lab Results  Component Value Date   HGBA1C 7.3 02/13/2015   Lab Results  Component Value Date   CHOL 108 10/27/2013   HDL 54 10/27/2013   LDLCALC 33 10/27/2013   TRIG 103 10/27/2013    Significant Diagnostic Results in last 30 days:  No results found.  Assessment/Plan Anemia of chronic disease 12/07/14 Hgb 10.4, MCV 89.3, MCH 30.1 12/21/14 Hgb 9.6 12/29/14 Hgb 9.2 02/13/15 Hgb 9.7 02/19/15 Hgb 10 03/15/15 Hgb 10.7  Chronic combined systolic and diastolic congestive heart failure (HCC) continue Furosemide 20mg  daily, Spironolactone 25mg , monitor for s/s of decompensation of CHF. 02/20/15 BNP 154.5, 03/15/15 Na 129, K 4.7, Bun 22, creat 0.73  Constipation Stable, continue MiraLax daily.   COPD (chronic obstructive pulmonary disease) (HCC) Continue O2 [place], [place] prn for wheezes, prednisone5mg , Diabetic Tussin prn  Edema chronic edema BLE, continue Furosemide 20mg  daily and Spironolactone 25mg  daily. BNP 123 12/29/14.  Monitor for s/s of developing CHF.  Esophageal reflux Stable, continue Omeprazole 20mg  daily   Hypertension - controlled, continue Cardura 8mg  daily, Metoprolol 25mg  bid, Losartan 50mg  and prn Clonidine 0.1mg  fro Bp>160/100 - On lasix 20mg  and resumed 02/16/15 Spironolactone 25mg  daily, chronic BLE edema - Continue statin   Hyponatremia 12/29/14 Na 136 02/13/15 Na 131 02/19/15 Na 134 03/15/15 Na 129 03/22/15 Na 135 CHF vs Prednisone taper dose. Monitor Na  Type 2 diabetes with decreased circulation (HCC) Continue Lantus 15u qd, increase Humalog 8u with meals, total 8u if  CBG>150, 02/13/15 Hgb A1c 7.3    Family/ staff Communication: continue SNF for care needs.   Labs/tests ordered:  none

## 2015-04-10 NOTE — Assessment & Plan Note (Signed)
Stable, continue Omeprazole 20mg daily.  

## 2015-04-13 DIAGNOSIS — M6281 Muscle weakness (generalized): Secondary | ICD-10-CM | POA: Diagnosis not present

## 2015-04-13 DIAGNOSIS — R2681 Unsteadiness on feet: Secondary | ICD-10-CM | POA: Diagnosis not present

## 2015-04-20 ENCOUNTER — Ambulatory Visit: Payer: Medicare Other | Admitting: Internal Medicine

## 2015-05-08 ENCOUNTER — Encounter: Payer: Self-pay | Admitting: Nurse Practitioner

## 2015-05-08 ENCOUNTER — Non-Acute Institutional Stay (SKILLED_NURSING_FACILITY): Payer: Medicare Other | Admitting: Nurse Practitioner

## 2015-05-08 DIAGNOSIS — E871 Hypo-osmolality and hyponatremia: Secondary | ICD-10-CM | POA: Diagnosis not present

## 2015-05-08 DIAGNOSIS — D638 Anemia in other chronic diseases classified elsewhere: Secondary | ICD-10-CM

## 2015-05-08 DIAGNOSIS — K219 Gastro-esophageal reflux disease without esophagitis: Secondary | ICD-10-CM | POA: Diagnosis not present

## 2015-05-08 DIAGNOSIS — K5900 Constipation, unspecified: Secondary | ICD-10-CM | POA: Diagnosis not present

## 2015-05-08 DIAGNOSIS — I10 Essential (primary) hypertension: Secondary | ICD-10-CM | POA: Diagnosis not present

## 2015-05-08 DIAGNOSIS — R609 Edema, unspecified: Secondary | ICD-10-CM | POA: Diagnosis not present

## 2015-05-08 DIAGNOSIS — J410 Simple chronic bronchitis: Secondary | ICD-10-CM | POA: Diagnosis not present

## 2015-05-08 DIAGNOSIS — I5042 Chronic combined systolic (congestive) and diastolic (congestive) heart failure: Secondary | ICD-10-CM | POA: Diagnosis not present

## 2015-05-08 DIAGNOSIS — E1151 Type 2 diabetes mellitus with diabetic peripheral angiopathy without gangrene: Secondary | ICD-10-CM | POA: Diagnosis not present

## 2015-05-08 NOTE — Assessment & Plan Note (Signed)
continue Furosemide 20mg  daily, Spironolactone 25mg , monitor for s/s of decompensation of CHF. 02/20/15 BNP 154.5, 03/15/15 Na 129, K 4.7, Bun 22, creat 0.73

## 2015-05-08 NOTE — Assessment & Plan Note (Signed)
Continue Lantus 15u qd, increase Humalog 8u with meals, total 8u if CBG>150, 02/13/15 Hgb A1c 7.3

## 2015-05-08 NOTE — Assessment & Plan Note (Signed)
chronic edema BLE, continue Furosemide 20mg  daily and Spironolactone 25mg  daily. BNP 123 12/29/14.  Monitor for s/s of developing CHF.

## 2015-05-08 NOTE — Assessment & Plan Note (Signed)
Stable, continue Omeprazole 20mg daily.  

## 2015-05-08 NOTE — Assessment & Plan Note (Signed)
-   controlled, continue Cardura 8mg  daily, Metoprolol 25mg  bid, Losartan 50mg  and prn Clonidine 0.1mg  fro Bp>160/100 - On lasix 20mg  and resumed 02/16/15 Spironolactone 25mg  daily, chronic BLE edema - Continue statin

## 2015-05-08 NOTE — Assessment & Plan Note (Signed)
Continue O2 [place], [place] prn for wheezes, prednisone 5mg , Diabetic Tussin prn

## 2015-05-08 NOTE — Assessment & Plan Note (Signed)
Stable, continue MiraLax daily.  

## 2015-05-08 NOTE — Assessment & Plan Note (Signed)
Baseline Hgb 10s 

## 2015-05-08 NOTE — Progress Notes (Signed)
Patient ID: [name], female   DOB: [birth year], 82 y.o.   MRN:  [id number]  Location:  [place] Room Number: 16 Place of Service:  SNF (31) Provider: Lennie Odor Umaiza Matusik NP  GREEN, Viviann Spare, MD  Patient Care Team: Estill Dooms, MD as PCP - General (Internal Medicine) Clent Jacks, MD (Ophthalmology) Adrian Prows, MD as Attending Physician (Cardiology) [facility] [name], MD as Consulting Physician (Orthopedic Surgery) Kathee Delton, MD as Consulting Physician (Pulmonary Disease)  Extended Emergency Contact Information Primary Emergency Contact: [name],[name] Address: [address]          [place], [place] [address] [name] of [place] Phone: [phone] Mobile Phone: [phone] Relation: Son Secondary Emergency Contact: [name] of [place] Phone: [phone] Relation: Daughter  Code Status:  DNR Goals of care: Advanced Directive information Advanced Directives 05/08/2015  Does patient have an advance directive? Yes  Type of Paramedic of [place];Out of facility DNR (pink MOST or yellow form)  Does patient want to make changes to advanced directive? No - Patient declined  Copy of advanced directive(s) in chart? Yes     Chief Complaint  Patient presents with  . Medical Management of Chronic Issues    Routine Visit    HPI:  Pt is a 82 y.o. female seen today for medical management of chronic diseases.  Hx of BLE edema, minimal while on Furosemide 20mg  and Spironolactone 25mg ,  chronic Bronchiectasis, managed with Pulmicort and Prednisone 5mg , chronic cough.  Blood sugar is managed with Lantus 15u, Humalog 8 u if CBG>150. No constipation while on MiraLax prn. Blood pressure is controlled on Clonidine 0.1mg  prn, Losartan 50mg , Metoprolol 25mg  bid, Cardura 8mg .     Past Medical History  Diagnosis Date  . Type II or unspecified type diabetes mellitus without mention of  complication, not stated as uncontrolled   . Hypertension   . Arthritis   . COPD (chronic obstructive pulmonary disease) ([place])   . Regional enteritis of unspecified site   . Other malaise and fatigue   . Anemia, unspecified   . Other and unspecified hyperlipidemia   . Hypoxemia   . Pneumonia, organism unspecified   . Mucopurulent chronic bronchitis ([place])   . Palpitations   . Nontoxic uninodular goiter   . Abnormality of gait   . Fall from other slipping, tripping, or stumbling   . Cholelithiases   . Closed fracture of unspecified part of ramus of mandible   . Disturbance of skin sensation   . Sciatica   . Tension headache   . Coronary atherosclerosis of unspecified type of vessel, native or graft   . Osteoarthrosis, unspecified whether generalized or localized, unspecified site   . Insomnia, unspecified   . Edema   . Tension headache   . Pain in joint, lower leg 2010    right knee  . Unspecified venous (peripheral) insufficiency   . Esophageal reflux   . Rosacea   . Cataract 1990    Dr. Katy Fitch  . Pain in joint, shoulder region 2013    left  . Gastric ulcer with hemorrhage 2008  . Urine, incontinence, stress female 07/19/2013  . Bronchiectasis without acute exacerbation ([place]) 04/10/2011    CT chest 2011   . DOE (dyspnea on exertion) 04/10/2011    PFTs 2013:  FEV1 1.04 (78%), ratio 64, couldn't do maneuver for lung volumes or dlco   . Nodule of neck 11/07/2013  Left neck posteriorly   . Squamous cell carcinoma of skin of lower limb, including hip 07/27/2012    Nonhealing lesion of the left mid calf medially   . Tachycardia 12/06/2014   Past Surgical History  Procedure Laterality Date  . Total knee arthroplasty Bilateral 1993, 1998    knees  . Other surgical history  2008    Ulcer surgery   . Small intestine surgery    . Eye surgery Bilateral 1990    Dr. Katy Fitch  . Gastrojejunostomy  05/2004    secondary to ulcer  . Joint replacement Bilateral 1993-1998    total knee  replacement  . Mole removal  05/11/14    left hand and left side of face [facility]    Allergies  Allergen Reactions  . Adhesive [Tape]     unknown  . Aspirin     unknown  . Augmentin [Amoxicillin-Pot Clavulanate]   . Codeine     unknown  . Diclofenac   . Enalapril Cough  . Hydrocodone     unknown  . Pantoprazole Sodium     unknown  . Voltaren [Diclofenac Sodium]       Medication List       This list is accurate as of: 05/08/15  4:25 PM.  Always use your most recent med list.               acetaminophen 500 MG tablet  Commonly known as:  TYLENOL  Take 500 mg by mouth 2 (two) times daily.     acetaminophen 325 MG tablet  Commonly known as:  TYLENOL  Take 650 mg by mouth every 6 (six) hours as needed for moderate pain or headache.     atorvastatin 10 MG tablet  Commonly known as:  LIPITOR  Take 10 mg by mouth daily.     budesonide 1 MG/2ML nebulizer solution  Commonly known as:  PULMICORT  Take 1 mg by nebulization 2 (two) times daily. Inhale every 12 hours if needed for dyspnea     cloNIDine 0.1 MG tablet  Commonly known as:  CATAPRES  Take 0.1 mg by mouth. Take one tablet as needed for bp > 160/100     doxazosin 8 MG tablet  Commonly known as:  CARDURA  Take 8 mg by mouth daily. To control BP.     furosemide 20 MG tablet  Commonly known as:  LASIX  Take 20 mg by mouth daily.     guaiFENesin 100 MG/5ML liquid  Commonly known as:  ROBITUSSIN  Take 200 mg by mouth. Take 10 ml every 6 hours as needed for cough     HUMALOG 100 UNIT/ML cartridge  Generic drug:  insulin lispro  Inject 8 Units into the skin 3 (three) times daily with meals. For CBG >150     insulin glargine 100 UNIT/ML injection  Commonly known as:  LANTUS  Inject 15 Units into the skin at bedtime.     losartan 50 MG tablet  Commonly known as:  COZAAR  Take 50 mg by mouth daily.     metoprolol tartrate 25 MG tablet  Commonly known as:  LOPRESSOR  Take 25 mg by mouth 2 (two)  times daily.     multivitamin with minerals tablet  Take 1 tablet by mouth daily.     omeprazole 20 MG capsule  Commonly known as:  PRILOSEC  Take 20 mg by mouth daily.     polyethylene glycol packet  Commonly known as:  MIRALAX / GLYCOLAX  Take 17 g by mouth daily as needed.     predniSONE 5 MG tablet  Commonly known as:  DELTASONE  Take 5 mg by mouth daily.     promethazine 25 MG tablet  Commonly known as:  PHENERGAN  Take 25 mg by mouth every 4 (four) hours as needed for nausea or vomiting.     promethazine 25 MG/ML injection  Commonly known as:  PHENERGAN  INJECT 1 ML EVERY FOUR HOURS AS NEEDED FOR NAUSEA IF ORAL IS REFUSED     spironolactone 25 MG tablet  Commonly known as:  ALDACTONE        Review of Systems  Constitutional: Negative for fever, chills and diaphoresis.       Baseline mobility is motorized scooter  HENT: Positive for hearing loss. Negative for congestion, ear discharge and ear pain.   Eyes: Negative for photophobia and pain.  Respiratory: Positive for cough, shortness of breath and wheezing.        Improved cough and SOB, mild wheezes on exertion  Cardiovascular: Positive for leg swelling. Negative for chest pain and palpitations.       1+ edema BLE,  Right knee pain.   Gastrointestinal: Positive for constipation. Negative for nausea, vomiting, abdominal pain and diarrhea.  Genitourinary: Positive for frequency and dyspareunia. Negative for dysuria and urgency.       Recurrent urinary tract infections. Nocturia. Urinary frequency.  Musculoskeletal: Negative for myalgias, back pain and neck pain.       R knee pain  Skin: Negative for rash.       Healing wound of the left hand dorsum near the thumb where a lesion was removed by Dr. Redmond Pulling at the [facility].  Allergic/Immunologic: Negative for environmental allergies.  Neurological: Negative for dizziness, tremors, seizures, weakness and headaches.  Psychiatric/Behavioral:  Negative for suicidal ideas and hallucinations. The patient is not nervous/anxious.     Immunization History  Administered Date(s) Administered  . Influenza Split 11/06/2011, 11/04/2012  . Influenza,inj,Quad PF,36+ Mos 11/17/2013  . Influenza-Unspecified 11/09/2014  . PPD Test 11/29/2014  . Pneumococcal Conjugate-13 08/11/2014  . Pneumococcal Polysaccharide-23 02/04/2003  . Tdap 02/27/2013  . Zoster 02/03/2006   Pertinent  Health Maintenance Due  Topic Date Due  . OPHTHALMOLOGY EXAM  10/31/1929  . DEXA SCAN  10/31/1984  . FOOT EXAM  11/25/2014  . HEMOGLOBIN A1C  08/13/2015  . INFLUENZA VACCINE  09/04/2015  . PNA vac Low Risk Adult  Completed   Fall Risk  12/06/2014 07/11/2014 05/22/2014 11/07/2013 08/30/2013  Falls in the past year? Yes No Yes No No  Number falls in past yr: 1 - 1 - -  Injury with Fall? No - No - -  Risk for fall due to : History of fall(s);Impaired mobility - History of fall(s);Impaired balance/gait - Impaired balance/gait;Impaired mobility  Follow up - - Falls evaluation completed - -   Functional Status Survey:    Filed Vitals:   05/08/15 1121  BP: 152/60  Pulse: 102  Temp: 98 F (36.7 C)  TempSrc: Oral  Resp: 24  Height: 5\' 4"  (1.626 m)  Weight: 193 lb (87.544 kg)  SpO2: 95%   Body mass index is 33.11 kg/(m^2). Physical Exam  Constitutional: She is oriented to person, place, and time. She appears well-developed and well-nourished. No distress.  HENT:  Head: Normocephalic and atraumatic.  Right Ear: External ear normal.  Left Ear: External ear normal.  Nose: Nose normal.  Eyes:  Conjunctivae and EOM are normal. Pupils are equal, round, and reactive to light.  Neck: Neck supple. No JVD present. No tracheal deviation present. No thyromegaly present.  Cardiovascular: Normal rate, regular rhythm, normal heart sounds and intact distal pulses.  Exam reveals no gallop and no friction rub.   No murmur heard. Pulmonary/Chest: No respiratory distress. She  has wheezes. She has rales.  Moist rales posterior mid to lower lungs. Trace edema BLE. Mild exertional wheezes  Abdominal: Bowel sounds are normal. She exhibits no distension and no mass. There is no tenderness.  Musculoskeletal: She exhibits edema (2-3+ bipedal) and tenderness.  Scars from bilateral TKR. Right knee seems to be loose when stressing the joint. No effusion. Instable gait. Driving a power scooter 2+ edema BLE Chronic R knee pain  Lymphadenopathy:    She has no cervical adenopathy.  Neurological: She is alert and oriented to person, place, and time. She has normal reflexes. No cranial nerve deficit. Coordination normal.  Diminished vibratory and monofilament testing in both feet.  Skin: No rash (Reddened rash on both legs. Some itching and burning associated with the rash.) noted. No erythema. No pallor.  Prior exam: 6 mm nodule of the left posterior neck at the SCM muscle seems resolved Likely SCC left leg medial to shin.  Psychiatric: She has a normal mood and affect. Her behavior is normal. Judgment and thought content normal.    Labs reviewed:  Recent Labs  11/27/14 0540 11/28/14 0550 11/29/14 0615  02/19/15 03/15/15 03/22/15  NA 128* 128* 129*  < > 134* 129* 135*  K 5.3* 5.0 4.8  < > 4.2 4.7 4.9  CL 91* 90* 92*  --   --   --   --   CO2 29 30 29   --   --   --   --   GLUCOSE 188* 277* 216*  --   --   --   --   BUN 31* 35* 27*  < > 17 22* 22*  CREATININE 0.76 0.87 0.78  < > 0.6 0.7 0.6  CALCIUM 8.7* 8.6* 8.9  --   --   --   --   < > = values in this interval not displayed.  Recent Labs  11/24/14 1013 12/07/14 02/13/15 03/15/15  AST 23 12* 10* 20  ALT 24 11 8 12   ALKPHOS 102 77 68 85  BILITOT 0.5  --   --   --   PROT 6.3*  --   --   --   ALBUMIN 2.8*  --   --   --     Recent Labs  11/24/14 1013  11/27/14 0812 11/28/14 0550 11/29/14 0615  02/13/15 02/19/15 03/15/15  WBC 17.9*  < > 21.1* 17.4* 16.6*  < > 9.0 7.5 11.6  NEUTROABS 14.8*  --   --   --    --   --   --   --   --   HGB 11.8*  < > 13.2 12.6 13.0  < > 9.7* 10.0* 10.7*  HCT 35.1*  < > 38.7 37.7 39.2  < > 32* 32* 35*  MCV 90.7  < > 90.4 91.1 91.0  --   --   --   --   PLT 263  < > 274 251 245  < > 215 193 220  < > = values in this interval not displayed. Lab Results  Component Value Date   TSH 0.82 02/13/2015   Lab Results  Component Value  Date   HGBA1C 7.3 02/13/2015   Lab Results  Component Value Date   CHOL 108 10/27/2013   HDL 54 10/27/2013   LDLCALC 33 10/27/2013   TRIG 103 10/27/2013    Significant Diagnostic Results in last 30 days:  No results found.  Assessment/Plan Anemia of chronic disease Baseline Hgb 10s.   Chronic combined systolic and diastolic congestive heart failure (HCC) continue Furosemide 20mg  daily, Spironolactone 25mg , monitor for s/s of decompensation of CHF. 02/20/15 BNP 154.5, 03/15/15 Na 129, K 4.7, Bun 22, creat 0.73   Constipation Stable, continue MiraLax daily.   COPD (chronic obstructive pulmonary disease) (HCC) Continue O2 [place] prn for wheezes, prednisone 5mg , Diabetic Tussin prn  Edema chronic edema BLE, continue Furosemide 20mg  daily and Spironolactone 25mg  daily. BNP 123 12/29/14.  Monitor for s/s of developing CHF.   Esophageal reflux Stable, continue Omeprazole 20mg  daily   Hypertension - controlled, continue Cardura 8mg  daily, Metoprolol 25mg  bid, Losartan 50mg  and prn Clonidine 0.1mg  fro Bp>160/100 - On lasix 20mg  and resumed 02/16/15 Spironolactone 25mg  daily, chronic BLE edema - Continue statin    Hyponatremia 12/29/14 Na 136 02/13/15 Na 131 02/19/15 Na 134 03/15/15 Na 129 03/22/15 Na 135 CHF vs Prednisone taper dose. Monitor Na   Type 2 diabetes with decreased circulation (HCC) Continue Lantus 15u qd, increase Humalog 8u with meals, total 8u if CBG>150, 02/13/15 Hgb A1c 7.3    Family/ staff Communication: continue SNF for care needs.   Labs/tests ordered:  none

## 2015-05-08 NOTE — Assessment & Plan Note (Signed)
12/29/14 Na 136 02/13/15 Na 131 02/19/15 Na 134 03/15/15 Na 129 03/22/15 Na 135 CHF vs Prednisone taper dose. Monitor Na

## 2015-05-10 ENCOUNTER — Encounter: Payer: Self-pay | Admitting: Internal Medicine

## 2015-05-10 ENCOUNTER — Non-Acute Institutional Stay (SKILLED_NURSING_FACILITY): Payer: Medicare Other | Admitting: Internal Medicine

## 2015-05-10 DIAGNOSIS — E1151 Type 2 diabetes mellitus with diabetic peripheral angiopathy without gangrene: Secondary | ICD-10-CM | POA: Diagnosis not present

## 2015-05-10 DIAGNOSIS — J479 Bronchiectasis, uncomplicated: Secondary | ICD-10-CM | POA: Diagnosis not present

## 2015-05-10 DIAGNOSIS — D638 Anemia in other chronic diseases classified elsewhere: Secondary | ICD-10-CM

## 2015-05-10 DIAGNOSIS — J410 Simple chronic bronchitis: Secondary | ICD-10-CM | POA: Diagnosis not present

## 2015-05-10 DIAGNOSIS — G4700 Insomnia, unspecified: Secondary | ICD-10-CM

## 2015-05-10 DIAGNOSIS — E871 Hypo-osmolality and hyponatremia: Secondary | ICD-10-CM

## 2015-05-10 DIAGNOSIS — I10 Essential (primary) hypertension: Secondary | ICD-10-CM | POA: Diagnosis not present

## 2015-05-10 NOTE — Progress Notes (Signed)
Patient ID: [name], female   DOB: 1917/10/11, 82 y.o.   MRN: [id number]  Location:  [place] Room Number: 16 Place of Service:  SNF [phone]) Provider:  Estill Dooms, MD  Patient Care Team: Estill Dooms, MD as PCP - General (Internal Medicine) Clent Jacks, MD (Ophthalmology) Adrian Prows, MD as Attending Physician (Cardiology) [facility] [name], MD as Consulting Physician (Orthopedic Surgery) Kathee Delton, MD as Consulting Physician (Pulmonary Disease)  Extended Emergency Contact Information Primary Emergency Contact: Zelada,[name] Address: [address]          [place], [place] [address] [name] of [place] Phone: [phone] Mobile Phone: [phone] Relation: Son Secondary Emergency Contact: [place] Phone: [phone] Relation: Daughter  Code Status:  DNR Goals of care: Advanced Directive information Advanced Directives 05/10/2015  Does patient have an advance directive? Yes  Type of Paramedic of [place];Living will;Out of facility DNR (pink MOST or yellow form)  Does patient want to make changes to advanced directive? -  Copy of advanced directive(s) in chart? Yes  Pre-existing out of facility DNR order (yellow form or pink MOST form) Yellow form placed in chart (order not valid for inpatient use)     Chief Complaint  Patient presents with  . Medical Management of Chronic Issues    routine    HPI:  Pt is a 82 y.o. female seen today for medical management of chronic diseases.    Simple chronic bronchitis (HCC) - unchanged  Insomnia - worse  Essential hypertension - controlled  Type 2 diabetes with decreased circulation (HCC) - controlled  Anemia of chronic disease - stable  Hyponatremia - improved  Bronchiectasis without complication (HCC) - Lieads to chronic abnormalities in her lung sounds.and dyspnea     Past Medical History    Diagnosis Date  . Type II or unspecified type diabetes mellitus without mention of complication, not stated as uncontrolled   . Hypertension   . Arthritis   . COPD (chronic obstructive pulmonary disease) ([place])   . Regional enteritis of unspecified site   . Other malaise and fatigue   . Anemia, unspecified   . Other and unspecified hyperlipidemia   . Hypoxemia   . Pneumonia, organism unspecified   . Mucopurulent chronic bronchitis ([place])   . Palpitations   . Nontoxic uninodular goiter   . Abnormality of gait   . Fall from other slipping, tripping, or stumbling   . Cholelithiases   . Closed fracture of unspecified part of ramus of mandible   . Disturbance of skin sensation   . Sciatica   . Tension headache   . Coronary atherosclerosis of unspecified type of vessel, native or graft   . Osteoarthrosis, unspecified whether generalized or localized, unspecified site   . Insomnia, unspecified   . Edema   . Tension headache   . Pain in joint, lower leg 2010    right knee  . Unspecified venous (peripheral) insufficiency   . Esophageal reflux   . Rosacea   . Cataract 1990    Dr. Katy Fitch  . Pain in joint, shoulder region 2013    left  . Gastric ulcer with hemorrhage 2008  . Urine, incontinence, stress female 07/19/2013  . Bronchiectasis without acute exacerbation ([place]) 04/10/2011    CT chest 2011   . DOE (dyspnea on exertion) 04/10/2011    PFTs 2013:  FEV1 1.04 (78%), ratio 64, couldn't do  maneuver for lung volumes or dlco   . Nodule of neck 11/07/2013    Left neck posteriorly   . Squamous cell carcinoma of skin of lower limb, including hip 07/27/2012    Nonhealing lesion of the left mid calf medially   . Tachycardia 12/06/2014   Past Surgical History  Procedure Laterality Date  . Total knee arthroplasty Bilateral 1993, 1998    knees  . Other surgical history  2008    Ulcer surgery   . Small intestine surgery    . Eye surgery Bilateral 1990    Dr. Katy Fitch  . Gastrojejunostomy   05/2004    secondary to ulcer  . Joint replacement Bilateral 1993-1998    total knee replacement  . Mole removal  05/11/14    left hand and left side of face [facility]    Allergies  Allergen Reactions  . Adhesive [Tape]     unknown  . Aspirin     unknown  . Augmentin [Amoxicillin-Pot Clavulanate]   . Codeine     unknown  . Diclofenac   . Enalapril Cough  . Hydrocodone     unknown  . Pantoprazole Sodium     unknown  . Voltaren [Diclofenac Sodium]       Medication List       This list is accurate as of: 05/10/15  4:50 PM.  Always use your most recent med list.               acetaminophen 500 MG tablet  Commonly known as:  TYLENOL  Take 500 mg by mouth 2 (two) times daily.     acetaminophen 325 MG tablet  Commonly known as:  TYLENOL  Take 650 mg by mouth every 6 (six) hours as needed for moderate pain or headache.     atorvastatin 10 MG tablet  Commonly known as:  LIPITOR  Take 10 mg by mouth daily.     budesonide 1 MG/2ML nebulizer solution  Commonly known as:  PULMICORT  Take 1 mg by nebulization 2 (two) times daily. Inhale every 12 hours if needed for dyspnea     cloNIDine 0.1 MG tablet  Commonly known as:  CATAPRES  Take 0.1 mg by mouth. Take one tablet as needed for bp > 160/100     doxazosin 8 MG tablet  Commonly known as:  CARDURA  Take 8 mg by mouth daily. To control BP.     furosemide 20 MG tablet  Commonly known as:  LASIX  Take 20 mg by mouth daily.     guaiFENesin 100 MG/5ML liquid  Commonly known as:  ROBITUSSIN  Take 200 mg by mouth. Take 10 ml every 6 hours as needed for cough     HUMALOG 100 UNIT/ML cartridge  Generic drug:  insulin lispro  Inject 8 Units into the skin 3 (three) times daily with meals. For CBG >150     insulin glargine 100 UNIT/ML injection  Commonly known as:  LANTUS  Inject 15 Units into the skin at bedtime.     losartan 50 MG tablet  Commonly known as:  COZAAR  Take 50 mg by mouth daily.      metoprolol tartrate 25 MG tablet  Commonly known as:  LOPRESSOR  Take 25 mg by mouth 2 (two) times daily.     multivitamin with minerals tablet  Take 1 tablet by mouth daily.     omeprazole 20 MG capsule  Commonly known as:  PRILOSEC  Take 20  mg by mouth daily.     polyethylene glycol packet  Commonly known as:  MIRALAX / GLYCOLAX  Take 17 g by mouth daily as needed.     predniSONE 5 MG tablet  Commonly known as:  DELTASONE  Take 5 mg by mouth daily.     promethazine 25 MG tablet  Commonly known as:  PHENERGAN  Take 25 mg by mouth every 4 (four) hours as needed for nausea or vomiting.     promethazine 25 MG/ML injection  Commonly known as:  PHENERGAN  INJECT 1 ML EVERY FOUR HOURS AS NEEDED FOR NAUSEA IF ORAL IS REFUSED     spironolactone 25 MG tablet  Commonly known as:  ALDACTONE        Review of Systems  Constitutional: Negative for fever, chills and diaphoresis.       Baseline mobility is motorized scooter  HENT: Positive for hearing loss. Negative for congestion, ear discharge and ear pain.   Eyes: Negative for photophobia and pain.  Respiratory: Positive for cough, shortness of breath and wheezing.        Lungs generally sound terrible. She has chronic bronchiectasis which leads to a persistent bronchial rattle, wheeze, and cough. Improved cough and SOB, mild wheezes on exertion  Cardiovascular: Positive for leg swelling. Negative for chest pain and palpitations.       1+ edema BLE,  Right knee pain.   Gastrointestinal: Positive for constipation. Negative for nausea, vomiting, abdominal pain and diarrhea.  Genitourinary: Positive for frequency and dyspareunia. Negative for dysuria and urgency.       Recurrent urinary tract infections. Nocturia. Urinary frequency.  Musculoskeletal: Negative for myalgias, back pain and neck pain.       R knee pain  Skin: Negative for rash.  Allergic/Immunologic: Negative for environmental allergies.  Neurological: Negative for  dizziness, tremors, seizures, weakness and headaches.  Psychiatric/Behavioral: Positive for sleep disturbance (Insomnia. He will fall asleep, but consistently wakes up at 1:30 AM and then finds it difficult to fall asleep for the rest of the night.). Negative for suicidal ideas and hallucinations. The patient is not nervous/anxious.     Immunization History  Administered Date(s) Administered  . Influenza Split 11/06/2011, 11/04/2012  . Influenza,inj,Quad PF,36+ Mos 11/17/2013  . Influenza-Unspecified 11/09/2014  . PPD Test 11/29/2014  . Pneumococcal Conjugate-13 08/11/2014  . Pneumococcal Polysaccharide-23 02/04/2003  . Tdap 02/27/2013  . Zoster 02/03/2006   Pertinent  Health Maintenance Due  Topic Date Due  . OPHTHALMOLOGY EXAM  10/31/1929  . DEXA SCAN  10/31/1984  . FOOT EXAM  11/25/2014  . HEMOGLOBIN A1C  08/13/2015  . INFLUENZA VACCINE  09/04/2015  . PNA vac Low Risk Adult  Completed   Fall Risk  12/06/2014 07/11/2014 05/22/2014 11/07/2013 08/30/2013  Falls in the past year? Yes No Yes No No  Number falls in past yr: 1 - 1 - -  Injury with Fall? No - No - -  Risk for fall due to : History of fall(s);Impaired mobility - History of fall(s);Impaired balance/gait - Impaired balance/gait;Impaired mobility  Follow up - - Falls evaluation completed - -     Filed Vitals:   05/10/15 1643  BP: 141/55  Pulse: 77  Temp: 98.2 F (36.8 C)  Height: 5\' 4"  (1.626 m)  Weight: 193 lb (87.544 kg)  SpO2: 96%   Body mass index is 33.11 kg/(m^2). Physical Exam  Constitutional: She is oriented to person, place, and time. She appears well-developed and well-nourished. No distress.  HENT:  Head: Normocephalic and atraumatic.  Right Ear: External ear normal.  Left Ear: External ear normal.  Nose: Nose normal.  Eyes: Conjunctivae and EOM are normal. Pupils are equal, round, and reactive to light.  Neck: Neck supple. No JVD present. No tracheal deviation present. No thyromegaly present.    Cardiovascular: Normal rate, regular rhythm, normal heart sounds and intact distal pulses.  Exam reveals no gallop and no friction rub.   No murmur heard. Pulmonary/Chest: No respiratory distress. She has wheezes. She has rales.  Moist rales posterior mid to lower lungs. Trace edema BLE. Mild exertional wheezes  Abdominal: Bowel sounds are normal. She exhibits no distension and no mass. There is no tenderness.  Musculoskeletal: She exhibits edema (2-3+ bipedal) and tenderness.  Scars from bilateral TKR. Right knee seems to be loose when stressing the joint. No effusion. Instable gait. Driving a power scooter 2+ edema BLE Chronic R knee pain  Lymphadenopathy:    She has no cervical adenopathy.  Neurological: She is alert and oriented to person, place, and time. She has normal reflexes. No cranial nerve deficit. Coordination normal.  Diminished vibratory and monofilament testing in both feet.  Skin: No rash (Reddened rash on both legs. Some itching and burning associated with the rash.) noted. No erythema. No pallor.  Prior exam: 6 mm nodule of the left posterior neck at the SCM muscle seems resolved Likely SCC left leg medial to shin.  Psychiatric: She has a normal mood and affect. Her behavior is normal. Judgment and thought content normal.    Labs reviewed:  Recent Labs  11/27/14 0540 11/28/14 0550 11/29/14 0615  02/19/15 03/15/15 03/22/15  NA 128* 128* 129*  < > 134* 129* 135*  K 5.3* 5.0 4.8  < > 4.2 4.7 4.9  CL 91* 90* 92*  --   --   --   --   CO2 29 30 29   --   --   --   --   GLUCOSE 188* 277* 216*  --   --   --   --   BUN 31* 35* 27*  < > 17 22* 22*  CREATININE 0.76 0.87 0.78  < > 0.6 0.7 0.6  CALCIUM 8.7* 8.6* 8.9  --   --   --   --   < > = values in this interval not displayed.  Recent Labs  11/24/14 1013 12/07/14 02/13/15 03/15/15  AST 23 12* 10* 20  ALT 24 11 8 12   ALKPHOS 102 77 68 85  BILITOT 0.5  --   --   --   PROT 6.3*  --   --   --   ALBUMIN 2.8*  --    --   --     Recent Labs  11/24/14 1013  11/27/14 0812 11/28/14 0550 11/29/14 0615  02/13/15 02/19/15 03/15/15  WBC 17.9*  < > 21.1* 17.4* 16.6*  < > 9.0 7.5 11.6  NEUTROABS 14.8*  --   --   --   --   --   --   --   --   HGB 11.8*  < > 13.2 12.6 13.0  < > 9.7* 10.0* 10.7*  HCT 35.1*  < > 38.7 37.7 39.2  < > 32* 32* 35*  MCV 90.7  < > 90.4 91.1 91.0  --   --   --   --   PLT 263  < > 274 251 245  < > 215 193 220  < > = values  in this interval not displayed. Lab Results  Component Value Date   TSH 0.82 02/13/2015   Lab Results  Component Value Date   HGBA1C 7.3 02/13/2015   Lab Results  Component Value Date   CHOL 108 10/27/2013   HDL 54 10/27/2013   LDLCALC 33 10/27/2013   TRIG 103 10/27/2013    Assessment/Plan 1. Simple chronic bronchitis ([place]) Continue current meds  2. Insomnia Abd zolpidem 5 mg at bedtime  3. Essential hypertension Current medications  4. Type 2 diabetes with decreased circulation (HCC) Continue current medication  5.Anemia of chronic disease Monitor CBC periodically  6. Hyponatremia Improving  7. Bronchiectasis without complication (HCC) Unchanged

## 2015-06-05 ENCOUNTER — Non-Acute Institutional Stay (SKILLED_NURSING_FACILITY): Payer: Medicare Other | Admitting: Nurse Practitioner

## 2015-06-05 ENCOUNTER — Encounter: Payer: Self-pay | Admitting: Nurse Practitioner

## 2015-06-05 DIAGNOSIS — I5042 Chronic combined systolic (congestive) and diastolic (congestive) heart failure: Secondary | ICD-10-CM | POA: Diagnosis not present

## 2015-06-05 DIAGNOSIS — J479 Bronchiectasis, uncomplicated: Secondary | ICD-10-CM

## 2015-06-05 DIAGNOSIS — E1151 Type 2 diabetes mellitus with diabetic peripheral angiopathy without gangrene: Secondary | ICD-10-CM | POA: Diagnosis not present

## 2015-06-05 DIAGNOSIS — R609 Edema, unspecified: Secondary | ICD-10-CM

## 2015-06-05 DIAGNOSIS — K219 Gastro-esophageal reflux disease without esophagitis: Secondary | ICD-10-CM

## 2015-06-05 DIAGNOSIS — E871 Hypo-osmolality and hyponatremia: Secondary | ICD-10-CM

## 2015-06-05 DIAGNOSIS — K5900 Constipation, unspecified: Secondary | ICD-10-CM

## 2015-06-05 DIAGNOSIS — I10 Essential (primary) hypertension: Secondary | ICD-10-CM

## 2015-06-05 DIAGNOSIS — J410 Simple chronic bronchitis: Secondary | ICD-10-CM | POA: Diagnosis not present

## 2015-06-05 DIAGNOSIS — D638 Anemia in other chronic diseases classified elsewhere: Secondary | ICD-10-CM | POA: Diagnosis not present

## 2015-06-06 NOTE — Assessment & Plan Note (Signed)
Continue O2 [place], [place] prn for wheezes, prednisone 5mg , Diabetic Tussin prn

## 2015-06-06 NOTE — Assessment & Plan Note (Signed)
12/29/14 Na 136 02/13/15 Na 131 02/19/15 Na 134 03/15/15 Na 129 03/22/15 Na 135 CHF vs Prednisone taper dose. Monitor Na

## 2015-06-06 NOTE — Assessment & Plan Note (Signed)
Stable, continue MiraLax daily.  

## 2015-06-06 NOTE — Assessment & Plan Note (Signed)
Stable, continue Omeprazole 20mg daily.  

## 2015-06-06 NOTE — Assessment & Plan Note (Signed)
Continue O2 [place], [place] prn for wheezes, prednisone 5mg , Diabetic Tussin prn

## 2015-06-06 NOTE — Assessment & Plan Note (Signed)
-   controlled, continue Cardura 8mg  daily, Metoprolol 25mg  bid, Losartan 50mg  and prn Clonidine 0.1mg  fro Bp>160/100 - On lasix 20mg  and resumed 02/16/15 Spironolactone 25mg  daily, chronic BLE edema - Continue statin

## 2015-06-06 NOTE — Assessment & Plan Note (Signed)
Baseline Hgb 10s 

## 2015-06-06 NOTE — Progress Notes (Signed)
Patient ID: [name], female   DOB: 04-Sep-1917, 82 y.o.   MRN:  [id number]  Location:  [facility] Room Number: 16 Place of Service:  SNF (31) Provider: Lennie Odor Dustine Bertini NP  GREEN, Viviann Spare, MD  Patient Care Team: Estill Dooms, MD as PCP - General (Internal Medicine) Clent Jacks, MD (Ophthalmology) Adrian Prows, MD as Attending Physician (Cardiology) [facility] [name], MD as Consulting Physician (Orthopedic Surgery) Kathee Delton, MD as Consulting Physician (Pulmonary Disease)  Extended Emergency Contact Information Primary Emergency Contact: Tamm,[name] Address: [address]          [place], [place] [address] [name] of [place] Phone: [phone] Mobile Phone: [phone] Relation: Son Secondary Emergency Contact: Wallace of [place] Phone: [phone] Relation: Daughter  Code Status:  DNR Goals of care: Advanced Directive information Advanced Directives 06/05/2015  Does patient have an advance directive? Yes  Type of Paramedic of [place];Living will;Out of facility DNR (pink MOST or yellow form)  Does patient want to make changes to advanced directive? No - Patient declined  Copy of advanced directive(s) in chart? Yes  Pre-existing out of facility DNR order (yellow form or pink MOST form) -     Chief Complaint  Patient presents with  . Medical Management of Chronic Issues    Routine Visit    HPI:  Pt is a 82 y.o. female seen today for medical management of chronic diseases.  Hx of BLE edema, minimal while on Furosemide 20mg  and Spironolactone 25mg ,  chronic Bronchiectasis, managed with Pulmicort and Prednisone 5mg , chronic cough.  Blood sugar is managed with Lantus 15u, Humalog 8 u if CBG>150. No constipation while on MiraLax prn. Blood pressure is controlled on Clonidine 0.1mg  prn, Losartan 50mg , Metoprolol 25mg  bid, Cardura 8mg .    Past Medical History  Diagnosis  Date  . Type II or unspecified type diabetes mellitus without mention of complication, not stated as uncontrolled   . Hypertension   . Arthritis   . COPD (chronic obstructive pulmonary disease) ([place])   . Regional enteritis of unspecified site   . Other malaise and fatigue   . Anemia, unspecified   . Other and unspecified hyperlipidemia   . Hypoxemia   . Pneumonia, organism unspecified   . Mucopurulent chronic bronchitis ([place])   . Palpitations   . Nontoxic uninodular goiter   . Abnormality of gait   . Fall from other slipping, tripping, or stumbling   . Cholelithiases   . Closed fracture of unspecified part of ramus of mandible   . Disturbance of skin sensation   . Sciatica   . Tension headache   . Coronary atherosclerosis of unspecified type of vessel, native or graft   . Osteoarthrosis, unspecified whether generalized or localized, unspecified site   . Insomnia, unspecified   . Edema   . Tension headache   . Pain in joint, lower leg 2010    right knee  . Unspecified venous (peripheral) insufficiency   . Esophageal reflux   . Rosacea   . Cataract 1990    Dr. Katy Fitch  . Pain in joint, shoulder region 2013    left  . Gastric ulcer with hemorrhage 2008  . Urine, incontinence, stress female 07/19/2013  . Bronchiectasis without acute exacerbation ([place]) 04/10/2011    CT chest 2011   . DOE (dyspnea on exertion) 04/10/2011    PFTs 2013:  FEV1 1.04 (78%), ratio 64, couldn't do  maneuver for lung volumes or dlco   . Nodule of neck 11/07/2013    Left neck posteriorly   . Squamous cell carcinoma of skin of lower limb, including hip 07/27/2012    Nonhealing lesion of the left mid calf medially   . Tachycardia 12/06/2014   Past Surgical History  Procedure Laterality Date  . Total knee arthroplasty Bilateral 1993, 1998    knees  . Other surgical history  2008    Ulcer surgery   . Small intestine surgery    . Eye surgery Bilateral 1990    Dr. Katy Fitch  . Gastrojejunostomy  05/2004     secondary to ulcer  . Joint replacement Bilateral 1993-1998    total knee replacement  . Mole removal  05/11/14    left hand and left side of face [facility]    Allergies  Allergen Reactions  . Adhesive [Tape]     unknown  . Aspirin     unknown  . Augmentin [Amoxicillin-Pot Clavulanate]   . Codeine     unknown  . Diclofenac   . Enalapril Cough  . Hydrocodone     unknown  . Pantoprazole Sodium     unknown  . Voltaren [Diclofenac Sodium]       Medication List       This list is accurate as of: 06/05/15 11:59 PM.  Always use your most recent med list.               acetaminophen 500 MG tablet  Commonly known as:  TYLENOL  Take 500 mg by mouth 2 (two) times daily.     acetaminophen 325 MG tablet  Commonly known as:  TYLENOL  Take 650 mg by mouth every 6 (six) hours as needed for moderate pain or headache.     atorvastatin 10 MG tablet  Commonly known as:  LIPITOR  Take 10 mg by mouth daily.     budesonide 1 MG/2ML nebulizer solution  Commonly known as:  PULMICORT  Take 1 mg by nebulization 2 (two) times daily. Inhale every 12 hours if needed for dyspnea     cloNIDine 0.1 MG tablet  Commonly known as:  CATAPRES  Take 0.1 mg by mouth. Take one tablet as needed for bp > 160/100     doxazosin 8 MG tablet  Commonly known as:  CARDURA  Take 8 mg by mouth daily. To control BP.     furosemide 20 MG tablet  Commonly known as:  LASIX  Take 20 mg by mouth daily.     guaiFENesin 100 MG/5ML liquid  Commonly known as:  ROBITUSSIN  Take 200 mg by mouth. Take 10 ml every 6 hours as needed for cough     HUMALOG 100 UNIT/ML cartridge  Generic drug:  insulin lispro  Inject 8 Units into the skin 3 (three) times daily with meals. For CBG >150     insulin glargine 100 UNIT/ML injection  Commonly known as:  LANTUS  Inject 15 Units into the skin at bedtime.     losartan 50 MG tablet  Commonly known as:  COZAAR  Take 50 mg by mouth daily.     metoprolol tartrate  25 MG tablet  Commonly known as:  LOPRESSOR  Take 25 mg by mouth 2 (two) times daily.     multivitamin with minerals tablet  Take 1 tablet by mouth daily.     omeprazole 20 MG capsule  Commonly known as:  PRILOSEC  Take 20 mg  by mouth daily.     polyethylene glycol packet  Commonly known as:  MIRALAX / GLYCOLAX  Take 17 g by mouth daily as needed.     predniSONE 5 MG tablet  Commonly known as:  DELTASONE  Take 5 mg by mouth daily.     promethazine 25 MG tablet  Commonly known as:  PHENERGAN  Take 25 mg by mouth every 4 (four) hours as needed for nausea or vomiting.     promethazine 25 MG/ML injection  Commonly known as:  PHENERGAN  INJECT 1 ML EVERY FOUR HOURS AS NEEDED FOR NAUSEA IF ORAL IS REFUSED     spironolactone 25 MG tablet  Commonly known as:  ALDACTONE     zolpidem 5 MG tablet  Commonly known as:  AMBIEN  Take 5 mg by mouth at bedtime.        Review of Systems  Constitutional: Negative for fever, chills and diaphoresis.       Baseline mobility is motorized scooter  HENT: Positive for hearing loss. Negative for congestion, ear discharge and ear pain.   Eyes: Negative for photophobia and pain.  Respiratory: Positive for cough, shortness of breath and wheezing.        Improved cough and SOB, mild wheezes on exertion  Cardiovascular: Positive for leg swelling. Negative for chest pain and palpitations.       1+ edema BLE,  Right knee pain.   Gastrointestinal: Positive for constipation. Negative for nausea, vomiting, abdominal pain and diarrhea.  Genitourinary: Positive for frequency and dyspareunia. Negative for dysuria and urgency.       Recurrent urinary tract infections. Nocturia. Urinary frequency.  Musculoskeletal: Negative for myalgias, back pain and neck pain.       R knee pain  Skin: Negative for rash.       Healing wound of the left hand dorsum near the thumb where a lesion was removed by Dr. Redmond Pulling at the [facility].    Allergic/Immunologic: Negative for environmental allergies.  Neurological: Negative for dizziness, tremors, seizures, weakness and headaches.  Psychiatric/Behavioral: Negative for suicidal ideas and hallucinations. The patient is not nervous/anxious.     Immunization History  Administered Date(s) Administered  . Influenza Split 11/06/2011, 11/04/2012  . Influenza,inj,Quad PF,36+ Mos 11/17/2013  . Influenza-Unspecified 11/09/2014  . PPD Test 11/29/2014  . Pneumococcal Conjugate-13 08/11/2014  . Pneumococcal Polysaccharide-23 02/04/2003  . Tdap 02/27/2013  . Zoster 02/03/2006   Pertinent  Health Maintenance Due  Topic Date Due  . OPHTHALMOLOGY EXAM  10/31/1927  . DEXA SCAN  10/31/1984  . FOOT EXAM  11/25/2014  . HEMOGLOBIN A1C  08/13/2015  . INFLUENZA VACCINE  09/04/2015  . PNA vac Low Risk Adult  Completed   Fall Risk  12/06/2014 07/11/2014 05/22/2014 11/07/2013 08/30/2013  Falls in the past year? Yes No Yes No No  Number falls in past yr: 1 - 1 - -  Injury with Fall? No - No - -  Risk for fall due to : History of fall(s);Impaired mobility - History of fall(s);Impaired balance/gait - Impaired balance/gait;Impaired mobility  Follow up - - Falls evaluation completed - -   Functional Status Survey:    Filed Vitals:   06/05/15 0950  BP: 150/60  Pulse: 99  Temp: 97.8 F (36.6 C)  TempSrc: Oral  Resp: 20  Height: 5\' 4"  (1.626 m)  Weight: 193 lb (87.544 kg)   Body mass index is 33.11 kg/(m^2). Physical Exam  Constitutional: She is oriented to person,  place, and time. She appears well-developed and well-nourished. No distress.  HENT:  Head: Normocephalic and atraumatic.  Right Ear: External ear normal.  Left Ear: External ear normal.  Nose: Nose normal.  Eyes: Conjunctivae and EOM are normal. Pupils are equal, round, and reactive to light.  Neck: Neck supple. No JVD present. No tracheal deviation present. No thyromegaly present.  Cardiovascular: Normal rate, regular rhythm,  normal heart sounds and intact distal pulses.  Exam reveals no gallop and no friction rub.   No murmur heard. Pulmonary/Chest: No respiratory distress. She has wheezes. She has rales.  Moist rales posterior mid to lower lungs. Trace edema BLE. Mild exertional wheezes  Abdominal: Bowel sounds are normal. She exhibits no distension and no mass. There is no tenderness.  Musculoskeletal: She exhibits edema (2-3+ bipedal) and tenderness.  Scars from bilateral TKR. Right knee seems to be loose when stressing the joint. No effusion. Instable gait. Driving a power scooter 2+ edema BLE Chronic R knee pain  Lymphadenopathy:    She has no cervical adenopathy.  Neurological: She is alert and oriented to person, place, and time. She has normal reflexes. No cranial nerve deficit. Coordination normal.  Diminished vibratory and monofilament testing in both feet.  Skin: No rash (Reddened rash on both legs. Some itching and burning associated with the rash.) noted. No erythema. No pallor.  Prior exam: 6 mm nodule of the left posterior neck at the SCM muscle seems resolved Likely SCC left leg medial to shin.  Psychiatric: She has a normal mood and affect. Her behavior is normal. Judgment and thought content normal.    Labs reviewed:  Recent Labs  11/27/14 0540 11/28/14 0550 11/29/14 0615  02/19/15 03/15/15 03/22/15  NA 128* 128* 129*  < > 134* 129* 135*  K 5.3* 5.0 4.8  < > 4.2 4.7 4.9  CL 91* 90* 92*  --   --   --   --   CO2 29 30 29   --   --   --   --   GLUCOSE 188* 277* 216*  --   --   --   --   BUN 31* 35* 27*  < > 17 22* 22*  CREATININE 0.76 0.87 0.78  < > 0.6 0.7 0.6  CALCIUM 8.7* 8.6* 8.9  --   --   --   --   < > = values in this interval not displayed.  Recent Labs  11/24/14 1013 12/07/14 02/13/15 03/15/15  AST 23 12* 10* 20  ALT 24 11 8 12   ALKPHOS 102 77 68 85  BILITOT 0.5  --   --   --   PROT 6.3*  --   --   --   ALBUMIN 2.8*  --   --   --     Recent Labs  11/24/14 1013   11/27/14 0812 11/28/14 0550 11/29/14 0615  02/13/15 02/19/15 03/15/15  WBC 17.9*  < > 21.1* 17.4* 16.6*  < > 9.0 7.5 11.6  NEUTROABS 14.8*  --   --   --   --   --   --   --   --   HGB 11.8*  < > 13.2 12.6 13.0  < > 9.7* 10.0* 10.7*  HCT 35.1*  < > 38.7 37.7 39.2  < > 32* 32* 35*  MCV 90.7  < > 90.4 91.1 91.0  --   --   --   --   PLT 263  < > 274 251  245  < > 215 193 220  < > = values in this interval not displayed. Lab Results  Component Value Date   TSH 0.82 02/13/2015   Lab Results  Component Value Date   HGBA1C 7.3 02/13/2015   Lab Results  Component Value Date   CHOL 108 10/27/2013   HDL 54 10/27/2013   LDLCALC 33 10/27/2013   TRIG 103 10/27/2013    Significant Diagnostic Results in last 30 days:  No results found.  Assessment/Plan Anemia of chronic disease Baseline Hgb 10s.    Bronchiectasis without acute exacerbation (HCC) Continue O2 [place], [place] prn for wheezes, prednisone 5mg , Diabetic Tussin prn   Chronic combined systolic and diastolic congestive heart failure (HCC) continue Furosemide 20mg  daily, Spironolactone 25mg , monitor for s/s of decompensation of CHF. 02/20/15 BNP 154.5, 03/15/15 Na 129, K 4.7, Bun 22, creat 0.73   Constipation Stable, continue MiraLax daily.    COPD (chronic obstructive pulmonary disease) (HCC) Continue O2 [place], [place] prn for wheezes, prednisone 5mg , Diabetic Tussin prn   Edema chronic edema BLE, continue Furosemide 20mg  daily and Spironolactone 25mg  daily. BNP 123 12/29/14.  Monitor for s/s of developing CHF.    Esophageal reflux Stable, continue Omeprazole 20mg  daily    Hypertension - controlled, continue Cardura 8mg  daily, Metoprolol 25mg  bid, Losartan 50mg  and prn Clonidine 0.1mg  fro Bp>160/100 - On lasix 20mg  and resumed 02/16/15 Spironolactone 25mg  daily, chronic BLE edema - Continue statin     Hyponatremia 12/29/14 Na 136 02/13/15 Na 131 02/19/15 Na 134 03/15/15 Na 129 03/22/15 Na 135 CHF vs Prednisone taper dose.  Monitor Na    Type 2 diabetes with decreased circulation (HCC) Continue Lantus 15u qd, increase Humalog 8u with meals, total 8u if CBG>150, 02/13/15 Hgb A1c 7.3     Family/ staff Communication: continue SNF for care needs.   Labs/tests ordered:  none

## 2015-06-06 NOTE — Assessment & Plan Note (Signed)
continue Furosemide 20mg  daily, Spironolactone 25mg , monitor for s/s of decompensation of CHF. 02/20/15 BNP 154.5, 03/15/15 Na 129, K 4.7, Bun 22, creat 0.73

## 2015-06-06 NOTE — Assessment & Plan Note (Signed)
chronic edema BLE, continue Furosemide 20mg  daily and Spironolactone 25mg  daily. BNP 123 12/29/14.  Monitor for s/s of developing CHF.

## 2015-06-06 NOTE — Assessment & Plan Note (Signed)
Continue Lantus 15u qd, increase Humalog 8u with meals, total 8u if CBG>150, 02/13/15 Hgb A1c 7.3

## 2015-07-03 ENCOUNTER — Encounter: Payer: Self-pay | Admitting: Nurse Practitioner

## 2015-07-03 ENCOUNTER — Non-Acute Institutional Stay (SKILLED_NURSING_FACILITY): Payer: Medicare Other | Admitting: Nurse Practitioner

## 2015-07-03 DIAGNOSIS — I5042 Chronic combined systolic (congestive) and diastolic (congestive) heart failure: Secondary | ICD-10-CM

## 2015-07-03 DIAGNOSIS — I10 Essential (primary) hypertension: Secondary | ICD-10-CM | POA: Diagnosis not present

## 2015-07-03 DIAGNOSIS — D638 Anemia in other chronic diseases classified elsewhere: Secondary | ICD-10-CM

## 2015-07-03 DIAGNOSIS — E1151 Type 2 diabetes mellitus with diabetic peripheral angiopathy without gangrene: Secondary | ICD-10-CM | POA: Diagnosis not present

## 2015-07-03 DIAGNOSIS — K5900 Constipation, unspecified: Secondary | ICD-10-CM | POA: Diagnosis not present

## 2015-07-03 DIAGNOSIS — R609 Edema, unspecified: Secondary | ICD-10-CM | POA: Diagnosis not present

## 2015-07-03 DIAGNOSIS — E871 Hypo-osmolality and hyponatremia: Secondary | ICD-10-CM | POA: Diagnosis not present

## 2015-07-03 DIAGNOSIS — M1990 Unspecified osteoarthritis, unspecified site: Secondary | ICD-10-CM

## 2015-07-03 DIAGNOSIS — J410 Simple chronic bronchitis: Secondary | ICD-10-CM | POA: Diagnosis not present

## 2015-07-03 DIAGNOSIS — L03115 Cellulitis of right lower limb: Secondary | ICD-10-CM | POA: Diagnosis not present

## 2015-07-03 DIAGNOSIS — K219 Gastro-esophageal reflux disease without esophagitis: Secondary | ICD-10-CM

## 2015-07-03 NOTE — Progress Notes (Signed)
Patient ID: [name], female   DOB: [birth year], 82 y.o.   MRN:  [id number]  Location:  [place] Room Number: N 16 Place of Service:  SNF (31) Provider: Lennie Odor Nakaya Mishkin NP  GREEN, Viviann Spare, MD  Patient Care Team: Estill Dooms, MD as PCP - General (Internal Medicine) Clent Jacks, MD (Ophthalmology) Adrian Prows, MD as Attending Physician (Cardiology) [facility] [name], MD as Consulting Physician (Orthopedic Surgery) Kathee Delton, MD as Consulting Physician (Pulmonary Disease)  Extended Emergency Contact Information Primary Emergency Contact: Weider,[name] Address: [address]          [place], [place] [address] [name] of [place] Phone: [phone] Mobile Phone: [phone] Relation: Son Secondary Emergency Contact: [place] of [place] Phone: [phone] Relation: Daughter  Code Status:  DNR Goals of care: Advanced Directive information Advanced Directives 07/03/2015  Does patient have an advance directive? Yes  Type of Advance Directive Out of facility DNR (pink MOST or yellow form);Living will;Healthcare Power of Attorney  Does patient want to make changes to advanced directive? No - Patient declined  Copy of advanced directive(s) in chart? Yes     Chief Complaint  Patient presents with  . Acute Visit    bilateral legs swelling, some drainge in the right leg and tenderness.    HPI:  Pt is a 82 y.o. female seen today for medical management of chronic diseases.  Hx of BLE edema, minimal while on Furosemide 20mg  and Spironolactone 25mg ,  chronic Bronchiectasis, managed with Pulmicort and Prednisone 5mg , chronic cough.  Blood sugar is managed with Lantus 15u, Humalog 8 u if CBG>150. No constipation while on MiraLax prn. Blood pressure is controlled on Clonidine 0.1mg  prn, Losartan 50mg , Metoprolol 25mg  bid, Cardura 8mg .    Past Medical History  Diagnosis Date  . Type II or unspecified type  diabetes mellitus without mention of complication, not stated as uncontrolled   . Hypertension   . Arthritis   . COPD (chronic obstructive pulmonary disease) ([place])   . Regional enteritis of unspecified site   . Other malaise and fatigue   . Anemia, unspecified   . Other and unspecified hyperlipidemia   . Hypoxemia   . Pneumonia, organism unspecified   . Mucopurulent chronic bronchitis ([place])   . Palpitations   . Nontoxic uninodular goiter   . Abnormality of gait   . Fall from other slipping, tripping, or stumbling   . Cholelithiases   . Closed fracture of unspecified part of ramus of mandible   . Disturbance of skin sensation   . Sciatica   . Tension headache   . Coronary atherosclerosis of unspecified type of vessel, native or graft   . Osteoarthrosis, unspecified whether generalized or localized, unspecified site   . Insomnia, unspecified   . Edema   . Tension headache   . Pain in joint, lower leg 2010    right knee  . Unspecified venous (peripheral) insufficiency   . Esophageal reflux   . Rosacea   . Cataract 1990    Dr. Katy Fitch  . Pain in joint, shoulder region 2013    left  . Gastric ulcer with hemorrhage 2008  . Urine, incontinence, stress female 07/19/2013  . Bronchiectasis without acute exacerbation ([place]) 04/10/2011    CT chest 2011   . DOE (dyspnea on exertion) 04/10/2011    PFTs 2013:  FEV1 1.04 (78%), ratio 64, couldn't do maneuver for lung volumes or dlco   .  Nodule of neck 11/07/2013    Left neck posteriorly   . Squamous cell carcinoma of skin of lower limb, including hip 07/27/2012    Nonhealing lesion of the left mid calf medially   . Tachycardia 12/06/2014   Past Surgical History  Procedure Laterality Date  . Total knee arthroplasty Bilateral 1993, 1998    knees  . Other surgical history  2008    Ulcer surgery   . Small intestine surgery    . Eye surgery Bilateral 1990    Dr. Katy Fitch  . Gastrojejunostomy  05/2004    secondary to ulcer  . Joint replacement  Bilateral 1993-1998    total knee replacement  . Mole removal  05/11/14    left hand and left side of face [facility]    Allergies  Allergen Reactions  . Adhesive [Tape]     unknown  . Aspirin     unknown  . Augmentin [Amoxicillin-Pot Clavulanate]   . Codeine     unknown  . Diclofenac   . Enalapril Cough  . Hydrocodone     unknown  . Pantoprazole Sodium     unknown  . Voltaren [Diclofenac Sodium]       Medication List       This list is accurate as of: 07/03/15  2:38 PM.  Always use your most recent med list.               acetaminophen 500 MG tablet  Commonly known as:  TYLENOL  Take 500 mg by mouth 2 (two) times daily.     acetaminophen 325 MG tablet  Commonly known as:  TYLENOL  Take 650 mg by mouth every 6 (six) hours as needed for moderate pain or headache.     atorvastatin 10 MG tablet  Commonly known as:  LIPITOR  Take 10 mg by mouth daily.     budesonide 1 MG/2ML nebulizer solution  Commonly known as:  PULMICORT  Take 1 mg by nebulization 2 (two) times daily. Inhale every 12 hours if needed for dyspnea     cloNIDine 0.1 MG tablet  Commonly known as:  CATAPRES  Take 0.1 mg by mouth. Take one tablet as needed for bp > 160/100     doxazosin 8 MG tablet  Commonly known as:  CARDURA  Take 8 mg by mouth daily. To control BP.     furosemide 20 MG tablet  Commonly known as:  LASIX  Take 20 mg by mouth daily.     guaiFENesin 100 MG/5ML liquid  Commonly known as:  ROBITUSSIN  Take 200 mg by mouth. Take 10 ml every 6 hours as needed for cough     HUMALOG 100 UNIT/ML cartridge  Generic drug:  insulin lispro  Inject 8 Units into the skin 3 (three) times daily with meals. For CBG >150     insulin glargine 100 UNIT/ML injection  Commonly known as:  LANTUS  Inject 15 Units into the skin at bedtime.     losartan 50 MG tablet  Commonly known as:  COZAAR  Take 50 mg by mouth daily.     metoprolol tartrate 25 MG tablet  Commonly known as:   LOPRESSOR  Take 25 mg by mouth 2 (two) times daily.     multivitamin with minerals tablet  Take 1 tablet by mouth daily.     omeprazole 20 MG capsule  Commonly known as:  PRILOSEC  Take 20 mg by mouth daily.     polyethylene  glycol packet  Commonly known as:  MIRALAX / GLYCOLAX  Take 17 g by mouth daily as needed.     predniSONE 5 MG tablet  Commonly known as:  DELTASONE  Take 5 mg by mouth daily.     promethazine 25 MG tablet  Commonly known as:  PHENERGAN  Take 25 mg by mouth every 4 (four) hours as needed for nausea or vomiting. Reported on 07/03/2015     promethazine 25 MG/ML injection  Commonly known as:  PHENERGAN  Reported on 07/03/2015     spironolactone 25 MG tablet  Commonly known as:  ALDACTONE     zolpidem 5 MG tablet  Commonly known as:  AMBIEN  Take 5 mg by mouth at bedtime.        Review of Systems  Constitutional: Negative for fever, chills and diaphoresis.       Baseline mobility is motorized scooter  HENT: Positive for hearing loss. Negative for congestion, ear discharge and ear pain.   Eyes: Negative for photophobia and pain.  Respiratory: Positive for cough, shortness of breath and wheezing.        Improved cough and SOB, mild wheezes on exertion  Cardiovascular: Positive for leg swelling. Negative for chest pain and palpitations.       1+ edema BLE,  Right knee pain.   Gastrointestinal: Positive for constipation. Negative for nausea, vomiting, abdominal pain and diarrhea.  Genitourinary: Positive for frequency and dyspareunia. Negative for dysuria and urgency.       Recurrent urinary tract infections. Nocturia. Urinary frequency.  Musculoskeletal: Negative for myalgias, back pain and neck pain.       R knee pain  Skin: Negative for rash.       Healing wound of the left hand dorsum near the thumb where a lesion was removed by Dr. Redmond Pulling at the [place] at [facility].  Allergic/Immunologic: Negative for environmental allergies.    Neurological: Negative for dizziness, tremors, seizures, weakness and headaches.  Psychiatric/Behavioral: Negative for suicidal ideas and hallucinations. The patient is not nervous/anxious.     Immunization History  Administered Date(s) Administered  . Influenza Split 11/06/2011, 11/04/2012  . Influenza,inj,Quad PF,36+ Mos 11/17/2013  . Influenza-Unspecified 11/09/2014  . PPD Test 11/29/2014  . Pneumococcal Conjugate-13 08/11/2014  . Pneumococcal Polysaccharide-23 02/04/2003  . Tdap 02/27/2013  . Zoster 02/03/2006   Pertinent  Health Maintenance Due  Topic Date Due  . OPHTHALMOLOGY EXAM  10/31/1929  . DEXA SCAN  10/31/1984  . FOOT EXAM  11/25/2014  . HEMOGLOBIN A1C  08/13/2015  . INFLUENZA VACCINE  09/04/2015  . PNA vac Low Risk Adult  Completed   Fall Risk  12/06/2014 07/11/2014 05/22/2014 11/07/2013 08/30/2013  Falls in the past year? Yes No Yes No No  Number falls in past yr: 1 - 1 - -  Injury with Fall? No - No - -  Risk for fall due to : History of fall(s);Impaired mobility - History of fall(s);Impaired balance/gait - Impaired balance/gait;Impaired mobility  Follow up - - Falls evaluation completed - -   Functional Status Survey:    Filed Vitals:   07/03/15 1426  BP: 124/72  Pulse: 84  Temp: 98.4 F (36.9 C)  TempSrc: Oral  Resp: 24  Height: 5\' 4"  (1.626 m)  Weight: 193 lb (87.544 kg)  SpO2: 95%   Body mass index is 33.11 kg/(m^2). Physical Exam  Constitutional: She is oriented to person, place, and time. She appears well-developed and well-nourished. No distress.  HENT:  Head: Normocephalic and atraumatic.  Right Ear: External ear normal.  Left Ear: External ear normal.  Nose: Nose normal.  Eyes: Conjunctivae and EOM are normal. Pupils are equal, round, and reactive to light.  Neck: Neck supple. No JVD present. No tracheal deviation present. No thyromegaly present.  Cardiovascular: Normal rate, regular rhythm, normal heart sounds and intact distal pulses.   Exam reveals no gallop and no friction rub.   No murmur heard. Pulmonary/Chest: No respiratory distress. She has wheezes. She has rales.  Moist rales posterior mid to lower lungs. Trace edema BLE. Mild exertional wheezes  Abdominal: Bowel sounds are normal. She exhibits no distension and no mass. There is no tenderness.  Musculoskeletal: She exhibits edema (2-3+ bipedal) and tenderness.  Scars from bilateral TKR. Right knee seems to be loose when stressing the joint. No effusion. Instable gait. Driving a power scooter 2+ edema BLE Chronic R knee pain  Lymphadenopathy:    She has no cervical adenopathy.  Neurological: She is alert and oriented to person, place, and time. She has normal reflexes. No cranial nerve deficit. Coordination normal.  Diminished vibratory and monofilament testing in both feet.  Skin: No rash (Reddened rash on both legs. Some itching and burning associated with the rash.) noted. No erythema. No pallor.  Prior exam: 6 mm nodule of the left posterior neck at the SCM muscle seems resolved Likely SCC left leg medial to shin.  Psychiatric: She has a normal mood and affect. Her behavior is normal. Judgment and thought content normal.    Labs reviewed:  Recent Labs  11/27/14 0540 11/28/14 0550 11/29/14 0615  02/19/15 03/15/15 03/22/15  NA 128* 128* 129*  < > 134* 129* 135*  K 5.3* 5.0 4.8  < > 4.2 4.7 4.9  CL 91* 90* 92*  --   --   --   --   CO2 29 30 29   --   --   --   --   GLUCOSE 188* 277* 216*  --   --   --   --   BUN 31* 35* 27*  < > 17 22* 22*  CREATININE 0.76 0.87 0.78  < > 0.6 0.7 0.6  CALCIUM 8.7* 8.6* 8.9  --   --   --   --   < > = values in this interval not displayed.  Recent Labs  11/24/14 1013 12/07/14 02/13/15 03/15/15  AST 23 12* 10* 20  ALT 24 11 8 12   ALKPHOS 102 77 68 85  BILITOT 0.5  --   --   --   PROT 6.3*  --   --   --   ALBUMIN 2.8*  --   --   --     Recent Labs  11/24/14 1013  11/27/14 0812 11/28/14 0550 11/29/14 0615   02/13/15 02/19/15 03/15/15  WBC 17.9*  < > 21.1* 17.4* 16.6*  < > 9.0 7.5 11.6  NEUTROABS 14.8*  --   --   --   --   --   --   --   --   HGB 11.8*  < > 13.2 12.6 13.0  < > 9.7* 10.0* 10.7*  HCT 35.1*  < > 38.7 37.7 39.2  < > 32* 32* 35*  MCV 90.7  < > 90.4 91.1 91.0  --   --   --   --   PLT 263  < > 274 251 245  < > 215 193 220  < > = values in  this interval not displayed. Lab Results  Component Value Date   TSH 0.82 02/13/2015   Lab Results  Component Value Date   HGBA1C 7.3 02/13/2015   Lab Results  Component Value Date   CHOL 108 10/27/2013   HDL 54 10/27/2013   LDLCALC 33 10/27/2013   TRIG 103 10/27/2013    Significant Diagnostic Results in last 30 days:  No results found.  Assessment/Plan Hypertension - controlled, continue Cardura 8mg  daily, Metoprolol 25mg  bid, Losartan 50mg  and prn Clonidine 0.1mg  fro Bp>160/100 - On lasix 20mg  and resumed 02/16/15 Spironolactone 25mg  daily, chronic BLE edema - Continue statin      Chronic combined systolic and diastolic congestive heart failure (HCC) BLE, more edematous, increase Furosemide 40mg  daily, Spironolactone 25mg , monitor for s/s of decompensation of CHF. 02/20/15 BNP 154.5, 03/15/15 Na 129, K 4.7, Bun 22, creat 0.73    COPD (chronic obstructive pulmonary disease) (HCC) Continue O2 [place], [place] prn for wheezes, prednisone 5mg , Diabetic Tussin prn    Esophageal reflux Stable, continue Omeprazole 20mg  daily     Constipation Stable, continue MiraLax daily.     Type 2 diabetes with decreased circulation (HCC) Continue Lantus 15u qd, increase Humalog 8u with meals, total 8u if CBG>150, 02/13/15 Hgb A1c 7.3    Arthritis Multiple sites.   Edema 07/03/15 increase Furosemide 40mg , update BMP one week.    Hyponatremia 12/29/14 Na 136 02/13/15 Na 131 02/19/15 Na 134 03/15/15 Na 129 03/22/15 Na 135 CHF vs Prednisone taper dose. Monitor Na Update BMP     Anemia of chronic disease Baseline Hgb 10s.    Cellulitis  of leg, right 10 day course of Doxy 100mg  bid since 06/29/15     Family/ staff Communication: continue SNF for care needs.   Labs/tests ordered:  BMP

## 2015-07-03 NOTE — Assessment & Plan Note (Signed)
10 day course of Doxy 100mg  bid since 06/29/15

## 2015-07-03 NOTE — Assessment & Plan Note (Signed)
Continue O2 [place], [place] prn for wheezes, prednisone 5mg , Diabetic Tussin prn

## 2015-07-03 NOTE — Assessment & Plan Note (Signed)
Baseline Hgb 10s 

## 2015-07-03 NOTE — Assessment & Plan Note (Signed)
-   controlled, continue Cardura 8mg  daily, Metoprolol 25mg  bid, Losartan 50mg  and prn Clonidine 0.1mg  fro Bp>160/100 - On lasix 20mg  and resumed 02/16/15 Spironolactone 25mg  daily, chronic BLE edema - Continue statin

## 2015-07-03 NOTE — Assessment & Plan Note (Signed)
12/29/14 Na 136 02/13/15 Na 131 02/19/15 Na 134 03/15/15 Na 129 03/22/15 Na 135 CHF vs Prednisone taper dose. Monitor Na Update BMP

## 2015-07-03 NOTE — Assessment & Plan Note (Signed)
Multiple sites 

## 2015-07-03 NOTE — Assessment & Plan Note (Signed)
Continue Lantus 15u qd, increase Humalog 8u with meals, total 8u if CBG>150, 02/13/15 Hgb A1c 7.3

## 2015-07-03 NOTE — Assessment & Plan Note (Signed)
Stable, continue Omeprazole 20mg daily.  

## 2015-07-03 NOTE — Assessment & Plan Note (Signed)
Stable, continue MiraLax daily.  

## 2015-07-03 NOTE — Assessment & Plan Note (Signed)
BLE, more edematous, increase Furosemide 40mg  daily, Spironolactone 25mg , monitor for s/s of decompensation of CHF. 02/20/15 BNP 154.5, 03/15/15 Na 129, K 4.7, Bun 22, creat 0.73

## 2015-07-03 NOTE — Assessment & Plan Note (Signed)
07/03/15 increase Furosemide 40mg , update BMP one week.

## 2015-07-09 DIAGNOSIS — E871 Hypo-osmolality and hyponatremia: Secondary | ICD-10-CM | POA: Diagnosis not present

## 2015-07-10 ENCOUNTER — Other Ambulatory Visit: Payer: Self-pay | Admitting: *Deleted

## 2015-07-19 DIAGNOSIS — E785 Hyperlipidemia, unspecified: Secondary | ICD-10-CM | POA: Diagnosis not present

## 2015-07-19 LAB — LIPID PANEL
CHOLESTEROL: 109 mg/dL (ref 0–200)
HDL: 75 mg/dL — AB (ref 35–70)
LDL CALC: 26 mg/dL
TRIGLYCERIDES: 39 mg/dL — AB (ref 40–160)

## 2015-08-23 DIAGNOSIS — J449 Chronic obstructive pulmonary disease, unspecified: Secondary | ICD-10-CM | POA: Diagnosis not present

## 2015-08-24 ENCOUNTER — Non-Acute Institutional Stay (SKILLED_NURSING_FACILITY): Payer: Medicare Other | Admitting: Nurse Practitioner

## 2015-08-24 ENCOUNTER — Encounter: Payer: Self-pay | Admitting: Nurse Practitioner

## 2015-08-24 DIAGNOSIS — G4700 Insomnia, unspecified: Secondary | ICD-10-CM | POA: Diagnosis not present

## 2015-08-24 DIAGNOSIS — E871 Hypo-osmolality and hyponatremia: Secondary | ICD-10-CM

## 2015-08-24 DIAGNOSIS — D638 Anemia in other chronic diseases classified elsewhere: Secondary | ICD-10-CM | POA: Diagnosis not present

## 2015-08-24 DIAGNOSIS — I5042 Chronic combined systolic (congestive) and diastolic (congestive) heart failure: Secondary | ICD-10-CM | POA: Diagnosis not present

## 2015-08-24 DIAGNOSIS — K219 Gastro-esophageal reflux disease without esophagitis: Secondary | ICD-10-CM | POA: Diagnosis not present

## 2015-08-24 DIAGNOSIS — H9313 Tinnitus, bilateral: Secondary | ICD-10-CM

## 2015-08-24 DIAGNOSIS — R609 Edema, unspecified: Secondary | ICD-10-CM

## 2015-08-24 DIAGNOSIS — J410 Simple chronic bronchitis: Secondary | ICD-10-CM

## 2015-08-24 DIAGNOSIS — E1151 Type 2 diabetes mellitus with diabetic peripheral angiopathy without gangrene: Secondary | ICD-10-CM

## 2015-08-24 DIAGNOSIS — H9319 Tinnitus, unspecified ear: Secondary | ICD-10-CM | POA: Insufficient documentation

## 2015-08-24 DIAGNOSIS — I10 Essential (primary) hypertension: Secondary | ICD-10-CM

## 2015-08-24 DIAGNOSIS — K5900 Constipation, unspecified: Secondary | ICD-10-CM | POA: Diagnosis not present

## 2015-08-24 NOTE — Assessment & Plan Note (Signed)
12/29/14 Na 136 02/13/15 Na 131 02/19/15 Na 134 03/15/15 Na 129 03/22/15 Na 135 CHF vs Prednisone taper dose. Monitor Na Update CMP

## 2015-08-24 NOTE — Assessment & Plan Note (Signed)
Update labs, CBC, CMP, TSH, Hgb a1c

## 2015-08-24 NOTE — Progress Notes (Signed)
Location:   [facility] Room Number: N-16 Place of Service:  SNF (31) Provider:  Maxamus Colao  NP    Patient Care Team: Estill Dooms, MD as PCP - General (Internal Medicine) Clent Jacks, MD (Ophthalmology) Adrian Prows, MD as Attending Physician (Cardiology) [facility] [name], MD as Consulting Physician (Orthopedic Surgery) Kathee Delton, MD as Consulting Physician (Pulmonary Disease) Shanin Szymanowski Otho Darner, NP as Nurse Practitioner (Internal Medicine)  Extended Emergency Contact Information Primary Emergency Contact: Fidalgo,[name] Address: [address]          [place],  [address] [place] Phone: [phone] Mobile Phone: [phone] Relation: Son Secondary Emergency Contact: Danford,[name]  [place] Mobile Phone: [phone] Relation: Daughter  Code Status:  DNR Goals of care: Advanced Directive information Advanced Directives 08/24/2015  Does patient have an advance directive? Yes  Type of Advance Directive Out of facility DNR (pink MOST or yellow form);Living will;Healthcare Power of Attorney  Does patient want to make changes to advanced directive? No - Patient declined  Copy of advanced directive(s) in chart? Yes     Chief Complaint  Patient presents with  . Medical Management of Chronic Issues    ringing in both ears,    HPI:  Pt is a 82 y.o. female seen today for medical management of chronic diseases.  C/o tinnitus, denied earache, no noted drainage, Hx of COPD, stable. COPD compensated, blood pressure is controlled.blood sugar is controlled.    Past Medical History  Diagnosis Date  . Type II or unspecified type diabetes mellitus without mention of complication, not stated as uncontrolled   . Hypertension   . Arthritis   . COPD (chronic obstructive pulmonary disease) ([place])   . Regional enteritis of unspecified site   . Other malaise and fatigue   . Anemia, unspecified   . Other and  unspecified hyperlipidemia   . Hypoxemia   . Pneumonia, organism unspecified   . Mucopurulent chronic bronchitis ([place])   . Palpitations   . Nontoxic uninodular goiter   . Abnormality of gait   . Fall from other slipping, tripping, or stumbling   . Cholelithiases   . Closed fracture of unspecified part of ramus of mandible   . Disturbance of skin sensation   . Sciatica   . Tension headache   . Coronary atherosclerosis of unspecified type of vessel, native or graft   . Osteoarthrosis, unspecified whether generalized or localized, unspecified site   . Insomnia, unspecified   . Edema   . Tension headache   . Pain in joint, lower leg 2010    right knee  . Unspecified venous (peripheral) insufficiency   . Esophageal reflux   . Rosacea   . Cataract 1990    Dr. Katy Fitch  . Pain in joint, shoulder region 2013    left  . Gastric ulcer with hemorrhage 2008  . Urine, incontinence, stress female 07/19/2013  . Bronchiectasis without acute exacerbation ([place]) 04/10/2011    CT chest 2011   . DOE (dyspnea on exertion) 04/10/2011    PFTs 2013:  FEV1 1.04 (78%), ratio 64, couldn't do maneuver for lung volumes or dlco   . Nodule of neck 11/07/2013    Left neck posteriorly   . Squamous cell carcinoma of skin of lower limb, including hip 07/27/2012    Nonhealing lesion of the left mid calf medially   . Tachycardia 12/06/2014   Past Surgical History  Procedure Laterality  Date  . Total knee arthroplasty Bilateral 1993, 1998    knees  . Other surgical history  2008    Ulcer surgery   . Small intestine surgery    . Eye surgery Bilateral 1990    Dr. Katy Fitch  . Gastrojejunostomy  05/2004    secondary to ulcer  . Joint replacement Bilateral 1993-1998    total knee replacement  . Mole removal  05/11/14    left hand and left side of face [facility]    Allergies  Allergen Reactions  . Adhesive [Tape]     unknown  . Aspirin     unknown  . Augmentin [Amoxicillin-Pot Clavulanate]   . Codeine      unknown  . Diclofenac   . Enalapril Cough  . Hydrocodone     unknown  . Pantoprazole Sodium     unknown  . Voltaren [Diclofenac Sodium]       Medication List       This list is accurate as of: 08/24/15  3:07 PM.  Always use your most recent med list.               acetaminophen 500 MG tablet  Commonly known as:  TYLENOL  Take 500 mg by mouth 2 (two) times daily.     acetaminophen 325 MG tablet  Commonly known as:  TYLENOL  Take 650 mg by mouth every 6 (six) hours as needed for moderate pain or headache.     atorvastatin 10 MG tablet  Commonly known as:  LIPITOR  Take 10 mg by mouth daily.     cloNIDine 0.1 MG tablet  Commonly known as:  CATAPRES  Take 0.1 mg by mouth. Take one tablet as needed for bp > 160/100     doxazosin 8 MG tablet  Commonly known as:  CARDURA  Take 8 mg by mouth daily. To control BP.     furosemide 20 MG tablet  Commonly known as:  LASIX  Take 40 mg by mouth daily.     HUMALOG 100 UNIT/ML cartridge  Generic drug:  insulin lispro  Inject 8 Units into the skin 3 (three) times daily with meals. For CBG >150     insulin glargine 100 UNIT/ML injection  Commonly known as:  LANTUS  Inject 15 Units into the skin at bedtime.     ipratropium-albuterol 0.5-2.5 (3) MG/3ML Soln  Commonly known as:  DUONEB  Take 3 mLs by nebulization every 6 (six) hours as needed.     losartan 50 MG tablet  Commonly known as:  COZAAR  Take 50 mg by mouth daily.     metoprolol tartrate 25 MG tablet  Commonly known as:  LOPRESSOR  Take 25 mg by mouth 2 (two) times daily.     multivitamin with minerals tablet  Take 1 tablet by mouth daily.     omeprazole 20 MG capsule  Commonly known as:  PRILOSEC  Take 20 mg by mouth daily.     polyethylene glycol packet  Commonly known as:  MIRALAX / GLYCOLAX  Take 17 g by mouth daily as needed.     predniSONE 5 MG tablet  Commonly known as:  DELTASONE  Take 5 mg by mouth daily.     promethazine 25 MG tablet    Commonly known as:  PHENERGAN  Take 25 mg by mouth every 4 (four) hours as needed for nausea or vomiting. Reported on 07/03/2015     promethazine 25 MG/ML injection  Commonly  known as:  PHENERGAN  Inject 25 mg into the vein every 4 (four) hours as needed. Reported on 07/03/2015     spironolactone 25 MG tablet  Commonly known as:  ALDACTONE  Take 25 mg by mouth daily.     zolpidem 5 MG tablet  Commonly known as:  AMBIEN  Take 5 mg by mouth at bedtime as needed.        Review of Systems  Constitutional: Negative for fever, chills and diaphoresis.       Baseline mobility is motorized scooter  HENT: Positive for hearing loss and tinnitus. Negative for congestion, ear discharge and ear pain.   Eyes: Negative for photophobia and pain.  Respiratory: Positive for cough, shortness of breath and wheezing.        Lungs generally sound terrible. She has chronic bronchiectasis which leads to a persistent bronchial rattle, wheeze, and cough. Improved cough and SOB, mild wheezes on exertion  Cardiovascular: Positive for leg swelling. Negative for chest pain and palpitations.       1+ edema BLE,  Right knee pain.   Gastrointestinal: Positive for constipation. Negative for nausea, vomiting, abdominal pain and diarrhea.  Genitourinary: Positive for frequency and dyspareunia. Negative for dysuria and urgency.       Recurrent urinary tract infections. Nocturia. Urinary frequency.  Musculoskeletal: Negative for myalgias, back pain and neck pain.       R knee pain  Skin: Negative for rash.  Allergic/Immunologic: Negative for environmental allergies.  Neurological: Negative for dizziness, tremors, seizures, weakness and headaches.  Psychiatric/Behavioral: Positive for sleep disturbance (Insomnia. He will fall asleep, but consistently wakes up at 1:30 AM and then finds it difficult to fall asleep for the rest of the night.). Negative for suicidal ideas and hallucinations. The patient is not  nervous/anxious.     Immunization History  Administered Date(s) Administered  . Influenza Split 11/06/2011, 11/04/2012  . Influenza,inj,Quad PF,36+ Mos 11/17/2013  . Influenza-Unspecified 11/09/2014  . PPD Test 11/29/2014  . Pneumococcal Conjugate-13 08/11/2014  . Pneumococcal Polysaccharide-23 02/04/2003  . Tdap 02/27/2013  . Zoster 02/03/2006   Pertinent  Health Maintenance Due  Topic Date Due  . OPHTHALMOLOGY EXAM  10/31/1929  . DEXA SCAN  10/31/1984  . FOOT EXAM  11/25/2014  . HEMOGLOBIN A1C  08/13/2015  . INFLUENZA VACCINE  09/04/2015  . PNA vac Low Risk Adult  Completed   Fall Risk  12/06/2014 07/11/2014 05/22/2014 11/07/2013 08/30/2013  Falls in the past year? Yes No Yes No No  Number falls in past yr: 1 - 1 - -  Injury with Fall? No - No - -  Risk for fall due to : History of fall(s);Impaired mobility - History of fall(s);Impaired balance/gait - Impaired balance/gait;Impaired mobility  Follow up - - Falls evaluation completed - -   Functional Status Survey:    Filed Vitals:   08/24/15 1346  BP: 146/76  Pulse: 96  Temp: 96.9 F (36.1 C)  Resp: 20  Height: 5\' 4"  (1.626 m)  Weight: 196 lb (88.905 kg)  SpO2: 98%   Body mass index is 33.63 kg/(m^2). Physical Exam  Constitutional: She is oriented to person, place, and time. She appears well-developed and well-nourished. No distress.  HENT:  Head: Normocephalic and atraumatic.  Right Ear: External ear normal.  Left Ear: External ear normal.  Nose: Nose normal.  Eyes: Conjunctivae and EOM are normal. Pupils are equal, round, and reactive to light.  Neck: Neck supple. No JVD present. No tracheal deviation present. No  thyromegaly present.  Cardiovascular: Normal rate, regular rhythm, normal heart sounds and intact distal pulses.  Exam reveals no gallop and no friction rub.   No murmur heard. Pulmonary/Chest: No respiratory distress. She has wheezes. She has rales.  Moist rales posterior mid to lower lungs. Trace edema  BLE. Mild exertional wheezes  Abdominal: Bowel sounds are normal. She exhibits no distension and no mass. There is no tenderness.  Musculoskeletal: She exhibits edema (2-3+ bipedal) and tenderness.  Scars from bilateral TKR. Right knee seems to be loose when stressing the joint. No effusion. Instable gait. Driving a power scooter 2+ edema BLE Chronic R knee pain  Lymphadenopathy:    She has no cervical adenopathy.  Neurological: She is alert and oriented to person, place, and time. She has normal reflexes. No cranial nerve deficit. Coordination normal.  Diminished vibratory and monofilament testing in both feet.  Skin: No rash (Reddened rash on both legs. Some itching and burning associated with the rash.) noted. No erythema. No pallor.  Prior exam: 6 mm nodule of the left posterior neck at the SCM muscle seems resolved Likely SCC left leg medial to shin.  Psychiatric: She has a normal mood and affect. Her behavior is normal. Judgment and thought content normal.    Labs reviewed:  Recent Labs  11/27/14 0540 11/28/14 0550 11/29/14 0615  02/19/15 03/15/15 03/22/15  NA 128* 128* 129*  < > 134* 129* 135*  K 5.3* 5.0 4.8  < > 4.2 4.7 4.9  CL 91* 90* 92*  --   --   --   --   CO2 29 30 29   --   --   --   --   GLUCOSE 188* 277* 216*  --   --   --   --   BUN 31* 35* 27*  < > 17 22* 22*  CREATININE 0.76 0.87 0.78  < > 0.6 0.7 0.6  CALCIUM 8.7* 8.6* 8.9  --   --   --   --   < > = values in this interval not displayed.  Recent Labs  11/24/14 1013 12/07/14 02/13/15 03/15/15  AST 23 12* 10* 20  ALT 24 11 8 12   ALKPHOS 102 77 68 85  BILITOT 0.5  --   --   --   PROT 6.3*  --   --   --   ALBUMIN 2.8*  --   --   --     Recent Labs  11/24/14 1013  11/27/14 0812 11/28/14 0550 11/29/14 0615  02/13/15 02/19/15 03/15/15  WBC 17.9*  < > 21.1* 17.4* 16.6*  < > 9.0 7.5 11.6  NEUTROABS 14.8*  --   --   --   --   --   --   --   --   HGB 11.8*  < > 13.2 12.6 13.0  < > 9.7* 10.0* 10.7*  HCT  35.1*  < > 38.7 37.7 39.2  < > 32* 32* 35*  MCV 90.7  < > 90.4 91.1 91.0  --   --   --   --   PLT 263  < > 274 251 245  < > 215 193 220  < > = values in this interval not displayed. Lab Results  Component Value Date   TSH 0.82 02/13/2015   Lab Results  Component Value Date   HGBA1C 7.3 02/13/2015   Lab Results  Component Value Date   CHOL 109 07/19/2015   HDL 75* 07/19/2015  [place] 26 07/19/2015   TRIG 39* 07/19/2015    Significant Diagnostic Results in last 30 days:  No results found.  Assessment/Plan Hypertension - controlled, continue Cardura 8mg  daily, Metoprolol 25mg  bid, Losartan 50mg  and prn Clonidine 0.1mg  fro Bp>160/100 - On lasix 20mg  and resumed 02/16/15 Spironolactone 25mg  daily, chronic BLE edema - Continue statin  - update CMP  Chronic combined systolic and diastolic congestive heart failure (HCC) BLE, chronic, continue Furosemide 40mg  daily, Spironolactone 25mg , monitor for s/s of decompensation of CHF. 02/20/15 BNP 154.5, 03/15/15 Na 129, K 4.7, Bun 22, creat 0.73, update CMP    COPD (chronic obstructive pulmonary disease) (HCC) Continue O2 [place], [place] prn for wheezes, prednisone 5mg , Diabetic Tussin prn    Esophageal reflux Stable, continue Omeprazole 20mg  daily   Constipation Stable, continue MiraLax daily.    Type 2 diabetes with decreased circulation (HCC) Continue Lantus 15u qd, increase Humalog 8u with meals, total 8u if CBG>150, 02/13/15 Hgb A1c 7.3, update Hgb a1c   Edema 07/03/15 Furosemide 40mg , update CMP    Insomnia Stable, continue Ambien prn  Hyponatremia 12/29/14 Na 136 02/13/15 Na 131 02/19/15 Na 134 03/15/15 Na 129 03/22/15 Na 135 CHF vs Prednisone taper dose. Monitor Na Update CMP   Anemia of chronic disease Baseline Hgb 10s. Update CBC  Tinnitus Update labs, CBC, CMP, TSH, Hgb a1c    Family/ staff Communication:   Labs/tests ordered:  CBC, CMP, TSH, Hgb A1c

## 2015-08-24 NOTE — Assessment & Plan Note (Signed)
Baseline Hgb 10s. Update CBC

## 2015-08-24 NOTE — Assessment & Plan Note (Signed)
-   controlled, continue Cardura 8mg  daily, Metoprolol 25mg  bid, Losartan 50mg  and prn Clonidine 0.1mg  fro Bp>160/100 - On lasix 20mg  and resumed 02/16/15 Spironolactone 25mg  daily, chronic BLE edema - Continue statin  - update CMP

## 2015-08-24 NOTE — Assessment & Plan Note (Signed)
BLE, chronic, continue Furosemide 40mg  daily, Spironolactone 25mg , monitor for s/s of decompensation of CHF. 02/20/15 BNP 154.5, 03/15/15 Na 129, K 4.7, Bun 22, creat 0.73, update CMP

## 2015-08-24 NOTE — Assessment & Plan Note (Signed)
Stable, continue Ambien prn  

## 2015-08-24 NOTE — Assessment & Plan Note (Signed)
Continue O2 [place], [place] prn for wheezes, prednisone 5mg , Diabetic Tussin prn

## 2015-08-24 NOTE — Assessment & Plan Note (Signed)
Stable, continue MiraLax daily.  

## 2015-08-24 NOTE — Assessment & Plan Note (Signed)
Stable, continue Omeprazole 20mg daily.  

## 2015-08-24 NOTE — Assessment & Plan Note (Signed)
07/03/15 Furosemide 40mg , update CMP

## 2015-08-24 NOTE — Assessment & Plan Note (Signed)
Continue Lantus 15u qd, increase Humalog 8u with meals, total 8u if CBG>150, 02/13/15 Hgb A1c 7.3, update Hgb a1c

## 2015-08-27 DIAGNOSIS — R5383 Other fatigue: Secondary | ICD-10-CM | POA: Diagnosis not present

## 2015-08-27 DIAGNOSIS — E119 Type 2 diabetes mellitus without complications: Secondary | ICD-10-CM | POA: Diagnosis not present

## 2015-08-27 DIAGNOSIS — R5381 Other malaise: Secondary | ICD-10-CM | POA: Diagnosis not present

## 2015-08-27 DIAGNOSIS — I10 Essential (primary) hypertension: Secondary | ICD-10-CM | POA: Diagnosis not present

## 2015-08-27 LAB — CBC AND DIFFERENTIAL
HCT: 33 % — AB (ref 36–46)
Hemoglobin: 10.7 g/dL — AB (ref 12.0–16.0)
Platelets: 249 10*3/uL (ref 150–399)
WBC: 8.5 10^3/mL

## 2015-08-27 LAB — HEPATIC FUNCTION PANEL
ALK PHOS: 105 U/L (ref 25–125)
ALT: 11 U/L (ref 7–35)
AST: 14 U/L (ref 13–35)
Bilirubin, Total: 0.3 mg/dL

## 2015-08-27 LAB — HEMOGLOBIN A1C: Hemoglobin A1C: 8

## 2015-08-27 LAB — BASIC METABOLIC PANEL
BUN: 30 mg/dL — AB (ref 4–21)
CREATININE: 0.7 mg/dL (ref <=1.1)
Glucose: 128 mg/dL
Potassium: 4.5 mmol/L (ref 3.4–5.3)
SODIUM: 137 mmol/L (ref 137–147)

## 2015-08-27 LAB — TSH: TSH: 3.08 u[IU]/mL (ref <=5.90)

## 2015-08-28 ENCOUNTER — Other Ambulatory Visit: Payer: Self-pay | Admitting: *Deleted

## 2015-08-28 ENCOUNTER — Encounter: Payer: Self-pay | Admitting: Nurse Practitioner

## 2015-08-28 ENCOUNTER — Non-Acute Institutional Stay (SKILLED_NURSING_FACILITY): Payer: Medicare Other | Admitting: Nurse Practitioner

## 2015-08-28 DIAGNOSIS — I10 Essential (primary) hypertension: Secondary | ICD-10-CM | POA: Diagnosis not present

## 2015-08-28 DIAGNOSIS — R609 Edema, unspecified: Secondary | ICD-10-CM | POA: Diagnosis not present

## 2015-08-28 DIAGNOSIS — D638 Anemia in other chronic diseases classified elsewhere: Secondary | ICD-10-CM | POA: Diagnosis not present

## 2015-08-28 DIAGNOSIS — K219 Gastro-esophageal reflux disease without esophagitis: Secondary | ICD-10-CM

## 2015-08-28 DIAGNOSIS — E871 Hypo-osmolality and hyponatremia: Secondary | ICD-10-CM | POA: Diagnosis not present

## 2015-08-28 DIAGNOSIS — I8312 Varicose veins of left lower extremity with inflammation: Secondary | ICD-10-CM | POA: Diagnosis not present

## 2015-08-28 DIAGNOSIS — I8311 Varicose veins of right lower extremity with inflammation: Secondary | ICD-10-CM | POA: Diagnosis not present

## 2015-08-28 DIAGNOSIS — E1151 Type 2 diabetes mellitus with diabetic peripheral angiopathy without gangrene: Secondary | ICD-10-CM

## 2015-08-28 DIAGNOSIS — M5481 Occipital neuralgia: Secondary | ICD-10-CM

## 2015-08-28 DIAGNOSIS — I872 Venous insufficiency (chronic) (peripheral): Secondary | ICD-10-CM

## 2015-08-28 DIAGNOSIS — J410 Simple chronic bronchitis: Secondary | ICD-10-CM | POA: Diagnosis not present

## 2015-08-28 DIAGNOSIS — I5042 Chronic combined systolic (congestive) and diastolic (congestive) heart failure: Secondary | ICD-10-CM

## 2015-08-28 DIAGNOSIS — K5900 Constipation, unspecified: Secondary | ICD-10-CM

## 2015-08-28 NOTE — Assessment & Plan Note (Signed)
Stable, continue MiraLax daily.  

## 2015-08-28 NOTE — Assessment & Plan Note (Signed)
BLE, chronic, continue Furosemide 40mg  daily, Spironolactone 25mg , monitor for s/s of decompensation of CHF. 02/20/15 BNP 154.5, 03/15/15 Na 129, K 4.7, Bun 22, creat 0.73

## 2015-08-28 NOTE — Assessment & Plan Note (Signed)
Stable, continue Omeprazole 20mg daily.  

## 2015-08-28 NOTE — Assessment & Plan Note (Signed)
Resolved, Na 137 08/27/15

## 2015-08-28 NOTE — Assessment & Plan Note (Signed)
controlled, continue Cardura 8mg  daily, Metoprolol 25mg  bid, Losartan 50mg  and prn Clonidine 0.1mg  fro Bp>160/100 - On lasix 20mg  and resumed 02/16/15 Spironolactone 25mg  daily, chronic BLE edema - Continue statin

## 2015-08-28 NOTE — Assessment & Plan Note (Signed)
07/03/15 Furosemide 40mg , 08/27/15 Bun 30, creat 0.7

## 2015-08-28 NOTE — Assessment & Plan Note (Signed)
Continue Lantus 15u qd, increase Humalog 8u with meals, total 8u if CBG>150, 08/27/15 /17 Hgb A1c 8.0

## 2015-08-28 NOTE — Assessment & Plan Note (Signed)
Continue O2 [place], Neb prn for wheezes, prednisone 5mg , Diabetic Tussin prn

## 2015-08-28 NOTE — Assessment & Plan Note (Signed)
Stable, Hgb 10/7 08/27/15, 10.7 03/15/15

## 2015-08-28 NOTE — Assessment & Plan Note (Signed)
Mycolog II cream bid. Also c/o itching neck, may apply to the affected area until healed.

## 2015-08-28 NOTE — Assessment & Plan Note (Signed)
Lateral behind left ear "hot flashes" Carotid artery [place] R+L

## 2015-08-28 NOTE — Progress Notes (Signed)
Location:   [place] Room Number: N-16 Place of Service:  SNF (31) Provider:  Dezmon Conover  NP  Jeanmarie Hubert, MD  Patient Care Team: Estill Dooms, MD as PCP - General (Internal Medicine) Clent Jacks, MD (Ophthalmology) Adrian Prows, MD as Attending Physician (Cardiology) [facility] [name], MD as Consulting Physician (Orthopedic Surgery) Kathee Delton, MD (Inactive) as Consulting Physician (Pulmonary Disease) Nida Manfredi Otho Darner, NP as Nurse Practitioner (Internal Medicine)  Extended Emergency Contact Information Primary Emergency Contact: Delamar,[name] Address: [address]          [place], [place] [address] [place] Phone: [phone] Mobile Phone: [phone] Relation: Son Secondary Emergency Contact: Dominy,[name]  [place] Mobile Phone: [phone] Relation: Daughter    Code Status:  DNR Goals of care: Advanced Directive information Advanced Directives 08/28/2015  Does patient have an advance directive? Yes  Type of Advance Directive Living will;Healthcare Power of [place];Out of facility DNR (pink MOST or yellow form)  Does patient want to make changes to advanced directive? No - Patient declined  Copy of advanced directive(s) in chart? Yes  Pre-existing out of facility DNR order (yellow form or pink MOST form) -     Chief Complaint  Patient presents with  . Acute Visit    Pain behind left ear that comes and goes.    HPI:  Pt is a 82 y.o. female seen today for an acute visit for left lateral occiput hot flashes for several days, on and off, denied change of vision, headaches, or focal weakness. Also c/o itching neck where O2 tubing in contact with skin, but no apparent rash noted, itching BLE from chronic venous dermatitis noted, scratched injury presents. COPD chronic cough, SOB at her baseline. Hgb a1c 8 recently, taking Insulin to manage blood sugar.    Past Medical History:  Diagnosis Date  .  Abnormality of gait   . Anemia, unspecified   . Arthritis   . Bronchiectasis without acute exacerbation ([place]) 04/10/2011   CT chest 2011   . Cataract 1990   Dr. Katy Fitch  . Cholelithiases   . Closed fracture of unspecified part of ramus of mandible   . COPD (chronic obstructive pulmonary disease) ([place])   . Coronary atherosclerosis of unspecified type of vessel, native or graft   . Disturbance of skin sensation   . DOE (dyspnea on exertion) 04/10/2011   PFTs 2013:  FEV1 1.04 (78%), ratio 64, couldn't do maneuver for lung volumes or dlco   . Edema   . Esophageal reflux   . Fall from other slipping, tripping, or stumbling   . Gastric ulcer with hemorrhage 2008  . Hypertension   . Hypoxemia   . Insomnia, unspecified   . Mucopurulent chronic bronchitis ([place])   . Nodule of neck 11/07/2013   Left neck posteriorly   . Nontoxic uninodular goiter   . Osteoarthrosis, unspecified whether generalized or localized, unspecified site   . Other and unspecified hyperlipidemia   . Other malaise and fatigue   . Pain in joint, lower leg 2010   right knee  . Pain in joint, shoulder region 2013   left  . Palpitations   . Pneumonia, organism unspecified   . Regional enteritis of unspecified site   . Rosacea   . Sciatica   . Squamous cell carcinoma of skin of lower limb, including hip 07/27/2012   Nonhealing lesion of the left mid calf medially   .  Tachycardia 12/06/2014  . Tension headache   . Tension headache   . Type II or unspecified type diabetes mellitus without mention of complication, not stated as uncontrolled   . Unspecified venous (peripheral) insufficiency   . Urine, incontinence, stress female 07/19/2013   Past Surgical History:  Procedure Laterality Date  . EYE SURGERY Bilateral 1990   Dr. Katy Fitch  . GASTROJEJUNOSTOMY  05/2004   secondary to ulcer  . JOINT REPLACEMENT Bilateral 1993-1998   total knee replacement  . MOLE REMOVAL  05/11/14   left hand and left side of [facility]  . OTHER SURGICAL HISTORY  2008   Ulcer surgery   . SMALL INTESTINE SURGERY    . TOTAL KNEE ARTHROPLASTY Bilateral 1993, 1998   knees    Allergies  Allergen Reactions  . Adhesive [Tape]     unknown  . Aspirin     unknown  . Augmentin [Amoxicillin-Pot Clavulanate]   . Codeine     unknown  . Diclofenac   . Enalapril Cough  . Hydrocodone     unknown  . Pantoprazole Sodium     unknown  . Voltaren [Diclofenac Sodium]       Medication List       Accurate as of 08/28/15  5:25 PM. Always use your most recent med list.          acetaminophen 500 MG tablet Commonly known as:  TYLENOL Take 500 mg by mouth 2 (two) times daily.   acetaminophen 325 MG tablet Commonly known as:  TYLENOL Take 650 mg by mouth every 6 (six) hours as needed for moderate pain or headache.   atorvastatin 10 MG tablet Commonly known as:  LIPITOR Take 10 mg by mouth daily.   cloNIDine 0.1 MG tablet Commonly known as:  CATAPRES Take 0.1 mg by mouth. Take one tablet as needed for bp > 160/100   doxazosin 8 MG tablet Commonly known as:  CARDURA Take 8 mg by mouth daily. To control BP.   furosemide 20 MG tablet Commonly known as:  LASIX Take 40 mg by mouth daily.   HUMALOG 100 UNIT/ML cartridge Generic drug:  insulin lispro Inject 8 Units into the skin 3 (three) times daily with meals. For CBG >150   insulin glargine 100 UNIT/ML injection Commonly known as:  LANTUS Inject 15 Units into the skin at bedtime.   ipratropium-albuterol 0.5-2.5 (3) MG/3ML Soln Commonly known as:  DUONEB Take 3 mLs by nebulization every 6 (six) hours as needed.   losartan 50 MG tablet Commonly known as:  COZAAR Take 50 mg by mouth daily.   metoprolol tartrate 25 MG tablet Commonly known as:  LOPRESSOR Take 25 mg by mouth 2 (two) times daily.   multivitamin with minerals tablet Take 1 tablet by mouth daily.   omeprazole 20 MG capsule Commonly known as:  PRILOSEC Take 20 mg by mouth daily.     polyethylene glycol packet Commonly known as:  MIRALAX / GLYCOLAX Take 17 g by mouth daily as needed.   predniSONE 5 MG tablet Commonly known as:  DELTASONE Take 5 mg by mouth daily.   promethazine 25 MG tablet Commonly known as:  PHENERGAN Take 25 mg by mouth every 4 (four) hours as needed for nausea or vomiting. Reported on 07/03/2015   promethazine 25 MG/ML injection Commonly known as:  PHENERGAN Inject 25 mg into the vein every 4 (four) hours as needed. Reported on 07/03/2015   spironolactone 25 MG tablet Commonly known  as:  ALDACTONE Take 25 mg by mouth daily.   zolpidem 5 MG tablet Commonly known as:  AMBIEN Take 5 mg by mouth at bedtime as needed.       Review of Systems  Constitutional: Negative for chills, diaphoresis and fever.       Baseline mobility is motorized scooter  HENT: Positive for hearing loss and tinnitus. Negative for congestion, ear discharge and ear pain.   Eyes: Negative for photophobia and pain.  Respiratory: Positive for cough and shortness of breath. Negative for wheezing.        Lungs generally sound terrible. She has chronic bronchiectasis which leads to a persistent bronchial rattle, wheeze, and cough. Improved cough and SOB, mild wheezes on exertion  Cardiovascular: Positive for leg swelling. Negative for chest pain and palpitations.       1+ edema BLE,  Right knee pain.   Gastrointestinal: Positive for constipation. Negative for abdominal pain, diarrhea, nausea and vomiting.  Genitourinary: Positive for dyspareunia and frequency. Negative for dysuria and urgency.       Recurrent urinary tract infections. Nocturia. Urinary frequency.  Musculoskeletal: Negative for back pain, myalgias and neck pain.       R knee pain  Skin: Positive for rash.       Itching around neck. Chronic venous dermatitis BLE  Allergic/Immunologic: Negative for environmental allergies.  Neurological: Negative for dizziness, tremors, seizures, weakness and headaches.        Left lateral occipital area hot flashes  Psychiatric/Behavioral: Positive for sleep disturbance (Insomnia. He will fall asleep, but consistently wakes up at 1:30 AM and then finds it difficult to fall asleep for the rest of the night.). Negative for hallucinations and suicidal ideas. The patient is not nervous/anxious.     Immunization History  Administered Date(s) Administered  . Influenza Split 11/06/2011, 11/04/2012  . Influenza,inj,Quad PF,36+ Mos 11/17/2013  . Influenza-Unspecified 11/09/2014  . PPD Test 11/29/2014  . Pneumococcal Conjugate-13 08/11/2014  . Pneumococcal Polysaccharide-23 02/04/2003  . Tdap 02/27/2013  . Zoster 02/03/2006   Pertinent  Health Maintenance Due  Topic Date Due  . OPHTHALMOLOGY EXAM  10/31/1929  . DEXA SCAN  10/31/1984  . FOOT EXAM  11/25/2014  . HEMOGLOBIN A1C  08/13/2015  . INFLUENZA VACCINE  09/04/2015  . PNA vac Low Risk Adult  Completed   Fall Risk  12/06/2014 07/11/2014 05/22/2014 11/07/2013 08/30/2013  Falls in the past year? Yes No Yes No No  Number falls in past yr: 1 - 1 - -  Injury with Fall? No - No - -  Risk for fall due to : History of fall(s);Impaired mobility - History of fall(s);Impaired balance/gait - Impaired balance/gait;Impaired mobility  Follow up - - Falls evaluation completed - -   Functional Status Survey:    Vitals:   08/28/15 1135  BP: 122/60  Pulse: 74  Resp: 20  Temp: 98 F (36.7 C)  Weight: 196 lb (88.9 kg)  Height: 5\' 4"  (1.626 m)   Body mass index is 33.64 kg/m. Physical Exam  Labs reviewed:  Recent Labs  11/27/14 0540 11/28/14 0550 11/29/14 0615  03/15/15 03/22/15 08/27/15  NA 128* 128* 129*  < > 129* 135* 137  K 5.3* 5.0 4.8  < > 4.7 4.9 4.5  CL 91* 90* 92*  --   --   --   --   CO2 29 30 29   --   --   --   --   GLUCOSE 188* 277* 216*  --   --   --   --  BUN 31* 35* 27*  < > 22* 22* 30*  CREATININE 0.76 0.87 0.78  < > 0.7 0.6 0.7  CALCIUM 8.7* 8.6* 8.9  --   --   --   --   < > = values in  this interval not displayed.  Recent Labs  11/24/14 1013  02/13/15 03/15/15 08/27/15  AST 23  < > 10* 20 14  ALT 24  < > 8 12 11   ALKPHOS 102  < > 68 85 105  BILITOT 0.5  --   --   --   --   PROT 6.3*  --   --   --   --   ALBUMIN 2.8*  --   --   --   --   < > = values in this interval not displayed.  Recent Labs  11/24/14 1013  11/27/14 0812 11/28/14 0550 11/29/14 0615  02/19/15 03/15/15 08/27/15  WBC 17.9*  < > 21.1* 17.4* 16.6*  < > 7.5 11.6 8.5  NEUTROABS 14.8*  --   --   --   --   --   --   --   --   HGB 11.8*  < > 13.2 12.6 13.0  < > 10.0* 10.7* 10.7*  HCT 35.1*  < > 38.7 37.7 39.2  < > 32* 35* 33*  MCV 90.7  < > 90.4 91.1 91.0  --   --   --   --   PLT 263  < > 274 251 245  < > 193 220 249  < > = values in this interval not displayed. Lab Results  Component Value Date   TSH 3.08 08/27/2015   Lab Results  Component Value Date   HGBA1C 8.0 08/27/2015   Lab Results  Component Value Date   CHOL 109 07/19/2015   HDL 75 (A) 07/19/2015   LDLCALC 26 07/19/2015   TRIG 39 (A) 07/19/2015    Significant Diagnostic Results in last 30 days:  No results found.  Assessment/Plan There are no diagnoses linked to this encounter.Occipital neuralgia of left side Lateral behind left ear "hot flashes" Carotid artery [place] R+L   Venous stasis dermatitis of both lower extremities Mycolog II cream bid. Also c/o itching neck, may apply to the affected area until healed.   Hypertension  controlled, continue Cardura 8mg  daily, Metoprolol 25mg  bid, Losartan 50mg  and prn Clonidine 0.1mg  fro Bp>160/100 - On lasix 20mg  and resumed 02/16/15 Spironolactone 25mg  daily, chronic BLE edema - Continue statin   Chronic combined systolic and diastolic congestive heart failure (HCC) BLE, chronic, continue Furosemide 40mg  daily, Spironolactone 25mg , monitor for s/s of decompensation of CHF. 02/20/15 BNP 154.5, 03/15/15 Na 129, K 4.7, Bun 22, creat 0.73    COPD (chronic obstructive pulmonary disease)  (HCC) Continue O2 [place] prn for wheezes, prednisone 5mg , Diabetic Tussin prn  Esophageal reflux Stable, continue Omeprazole 20mg  daily   Constipation Stable, continue MiraLax daily.     Type 2 diabetes with decreased circulation (HCC) Continue Lantus 15u qd, increase Humalog 8u with meals, total 8u if CBG>150, 08/27/15 /17 Hgb A1c 8.0  Edema 07/03/15 Furosemide 40mg , 08/27/15 Bun 30, creat 0.7  Hyponatremia Resolved, Na 137 08/27/15  Anemia of chronic disease Stable, Hgb 10/7 08/27/15, 10.7 03/15/15    Family/ staff Communication: continue SNF for care needs.   Labs/tests ordered: carotid artery [place] R+L

## 2015-08-29 DIAGNOSIS — R55 Syncope and collapse: Secondary | ICD-10-CM | POA: Diagnosis not present

## 2015-09-20 ENCOUNTER — Encounter: Payer: Self-pay | Admitting: Internal Medicine

## 2015-09-20 DIAGNOSIS — Z85828 Personal history of other malignant neoplasm of skin: Secondary | ICD-10-CM | POA: Diagnosis not present

## 2015-09-20 DIAGNOSIS — D485 Neoplasm of uncertain behavior of skin: Secondary | ICD-10-CM | POA: Diagnosis not present

## 2015-09-20 DIAGNOSIS — L570 Actinic keratosis: Secondary | ICD-10-CM | POA: Diagnosis not present

## 2015-09-20 DIAGNOSIS — L820 Inflamed seborrheic keratosis: Secondary | ICD-10-CM | POA: Diagnosis not present

## 2015-09-20 DIAGNOSIS — L821 Other seborrheic keratosis: Secondary | ICD-10-CM | POA: Diagnosis not present

## 2015-09-20 DIAGNOSIS — L918 Other hypertrophic disorders of the skin: Secondary | ICD-10-CM | POA: Diagnosis not present

## 2015-09-21 ENCOUNTER — Non-Acute Institutional Stay (SKILLED_NURSING_FACILITY): Payer: Medicare Other | Admitting: Internal Medicine

## 2015-09-21 ENCOUNTER — Encounter: Payer: Self-pay | Admitting: Internal Medicine

## 2015-09-21 DIAGNOSIS — E1151 Type 2 diabetes mellitus with diabetic peripheral angiopathy without gangrene: Secondary | ICD-10-CM

## 2015-09-21 DIAGNOSIS — J479 Bronchiectasis, uncomplicated: Secondary | ICD-10-CM

## 2015-09-21 DIAGNOSIS — E871 Hypo-osmolality and hyponatremia: Secondary | ICD-10-CM

## 2015-09-21 DIAGNOSIS — I10 Essential (primary) hypertension: Secondary | ICD-10-CM

## 2015-09-21 DIAGNOSIS — R609 Edema, unspecified: Secondary | ICD-10-CM

## 2015-09-21 DIAGNOSIS — I5042 Chronic combined systolic (congestive) and diastolic (congestive) heart failure: Secondary | ICD-10-CM

## 2015-09-21 NOTE — Progress Notes (Signed)
Location:   [place] Room Number: [place] of Service:  SNF (31) Provider:Arthur Nyoka Cowden, MD  Patient Care Team: Carla Dooms, MD as PCP - General (Internal Medicine) Carla Jacks, MD (Ophthalmology) Carla Prows, MD as Attending Physician (Cardiology) [facility] Carla Killings, MD as Consulting Physician (Orthopedic Surgery) Carla Delton, MD as Consulting Physician (Pulmonary Disease) [facility] Carla Darner, NP as Nurse Practitioner (Internal Medicine)  Extended Emergency Contact Information Primary Emergency Contact: [name],Carla Address: [address]          [place], [place] [address] [place] Phone: [phone] Mobile Phone: [phone] Relation: Son Secondary Emergency Contact: [name],Carla  [place] Mobile Phone: [phone] Relation: Daughter  Code Status:  DNR  Goals of care: Advanced Directive information Advanced Directives 09/21/2015  Does patient have an advance directive? Yes  Type of Paramedic of [place];Living will;Out of facility DNR (pink MOST or yellow form)  Does patient want to make changes to advanced directive? -  Copy of advanced directive(s) in chart? Yes  Pre-existing out of facility DNR order (yellow form or pink MOST form) -     Chief Complaint  Patient presents with  . Medical Management of Chronic Issues    routine medication management    HPI:  Pt is a 82 y.o. female seen today for medical management of chronic diseases.    Bronchiectasis without complication (HCC) - chronic ng disease with O2 dependency.   Edema, unspecified type = 1-2 + bipedal. Unchanged.  Essential hypertension - mild elevation in SBP today. Generally vcontrolled  Hyponatremia - stable  Type 2 diabetes with decreased circulation (HCC) - controlled  Chronic combined systolic and diastolic congestive heart failure ([place]) - compensated     Past Medical History:  Diagnosis Date   . Abnormality of gait   . Anemia, unspecified   . Arthritis   . Bronchiectasis without acute exacerbation ([place]) 04/10/2011   CT chest 2011   . Cataract 1990   Dr. Katy Fitch  . Cholelithiases   . Closed fracture of unspecified part of ramus of mandible   . COPD (chronic obstructive pulmonary disease) ([place])   . Coronary atherosclerosis of unspecified type of vessel, native or graft   . Disturbance of skin sensation   . DOE (dyspnea on exertion) 04/10/2011   PFTs 2013:  FEV1 1.04 (78%), ratio 64, couldn't do maneuver for lung volumes or dlco   . Edema   . Esophageal reflux   . Fall from other slipping, tripping, or stumbling   . Gastric ulcer with hemorrhage 2008  . Hypertension   . Hypoxemia   . Insomnia, unspecified   . Mucopurulent chronic bronchitis ([place])   . Nodule of neck 11/07/2013   Left neck posteriorly   . Nontoxic uninodular goiter   . Osteoarthrosis, unspecified whether generalized or localized, unspecified site   . Other and unspecified hyperlipidemia   . Other malaise and fatigue   . Pain in joint, lower leg 2010   right knee  . Pain in joint, shoulder region 2013   left  . Palpitations   . Pneumonia, organism unspecified   . Regional enteritis of unspecified site   . Rosacea   . Sciatica   . Squamous cell carcinoma of skin of lower limb, including hip 07/27/2012   Nonhealing lesion of the left mid calf medially   . Tachycardia 12/06/2014  . Tension headache   . Tension headache   .  Type II or unspecified type diabetes mellitus without mention of complication, not stated as uncontrolled   . Unspecified venous (peripheral) insufficiency   . Urine, incontinence, stress female 07/19/2013   Past Surgical History:  Procedure Laterality Date  . EYE SURGERY Bilateral 1990   Dr. Katy Fitch  . GASTROJEJUNOSTOMY  05/2004   secondary to ulcer  . JOINT REPLACEMENT Bilateral 1993-1998   total knee replacement  . MOLE REMOVAL  05/11/14   left hand and left side of face [facility]  . OTHER SURGICAL HISTORY  2008   Ulcer surgery   . SMALL INTESTINE SURGERY    . TOTAL KNEE ARTHROPLASTY Bilateral 1993, 1998   knees    Allergies  Allergen Reactions  . Adhesive [Tape]     unknown  . Aspirin     unknown  . Augmentin [Amoxicillin-Pot Clavulanate]   . Codeine     unknown  . Diclofenac   . Enalapril Cough  . Hydrocodone     unknown  . Pantoprazole Sodium     unknown  . Voltaren [Diclofenac Sodium]       Medication List       Accurate as of 09/21/15 10:28 AM. Always use your most recent med list.          acetaminophen 500 MG tablet Commonly known as:  TYLENOL Take 500 mg by mouth. Take 2 at bedtime   acetaminophen 325 MG tablet Commonly known as:  TYLENOL Take 650 mg by mouth every 6 (six) hours as needed for moderate pain or headache.   atorvastatin 10 MG tablet Commonly known as:  LIPITOR Take 10 mg by mouth daily.   cloNIDine 0.1 MG tablet Commonly known as:  CATAPRES Take 0.1 mg by mouth. Take one tablet as needed for bp > 160/100   DIABETIC TUSSIN MUCUS RELIEF 200 MG/5ML Liqd Generic drug:  Guaifenesin Take by mouth. Take 10 ml every 6 hours as needed for cough   doxazosin 8 MG tablet Commonly known as:  CARDURA Take 8 mg by mouth daily. To control BP.   furosemide 20 MG tablet Commonly known as:  LASIX Take 40 mg by mouth daily.   HUMALOG 100 UNIT/ML cartridge Generic drug:  insulin lispro Inject 8 Units into the skin 3 (three) times daily with meals. For CBG >150   insulin glargine 100 UNIT/ML injection Commonly known as:  LANTUS Inject 15 Units into the skin at bedtime.   ipratropium-albuterol 0.5-2.5 (3) MG/3ML Soln Commonly known as:  DUONEB Take 3 mLs by nebulization every 6 (six) hours as needed.   losartan 50 MG tablet Commonly known as:  COZAAR Take 50 mg by mouth daily.   metoprolol tartrate 25 MG tablet Commonly known as:  LOPRESSOR Take 25 mg by mouth 2 (two) times daily.   multivitamin with  minerals tablet Take 1 tablet by mouth daily.   omeprazole 20 MG capsule Commonly known as:  PRILOSEC Take 20 mg by mouth daily.   polyethylene glycol packet Commonly known as:  MIRALAX / GLYCOLAX Take 17 g by mouth daily as needed.   predniSONE 5 MG tablet Commonly known as:  DELTASONE Take 5 mg by mouth daily.   promethazine 25 MG tablet Commonly known as:  PHENERGAN Take 25 mg by mouth every 4 (four) hours as needed for nausea or vomiting. Reported on 07/03/2015   promethazine 25 MG/ML injection Commonly known as:  PHENERGAN Inject 25 mg into the vein every 4 (four) hours as needed. Reported  on 07/03/2015   spironolactone 25 MG tablet Commonly known as:  ALDACTONE Take 25 mg by mouth daily.   zolpidem 5 MG tablet Commonly known as:  AMBIEN Take 5 mg by mouth at bedtime as needed.       Review of Systems  Constitutional: Negative for chills, diaphoresis and fever.       Baseline mobility is motorized scooter  HENT: Positive for hearing loss and tinnitus. Negative for congestion, ear discharge and ear pain.   Eyes: Negative for photophobia and pain.  Respiratory: Positive for cough and shortness of breath. Negative for wheezing.        She has chronic bronchiectasis which leads to a persistent bronchial rattle, wheeze, and cough. Improved cough and SOB, mild wheezes on exertion  Cardiovascular: Positive for leg swelling. Negative for chest pain and palpitations.       Hx of chronic combined systolic and diastolic CHF. 1+ edema BLE,  Right knee pain.   Gastrointestinal: Positive for constipation. Negative for abdominal pain, diarrhea, nausea and vomiting.  Genitourinary: Positive for dyspareunia and frequency. Negative for dysuria and urgency.       Recurrent urinary tract infections. Nocturia. Urinary frequency.  Musculoskeletal: Negative for back pain, myalgias and neck pain.       R knee pain  Skin: Positive for rash.       Itching around neck. Chronic venous  dermatitis BLE  Allergic/Immunologic: Negative for environmental allergies.  Neurological: Negative for dizziness, tremors, seizures, weakness and headaches.  Psychiatric/Behavioral: Positive for sleep disturbance (Insomnia. She will fall asleep, but consistently wakes up at 1:30 AM and then finds it difficult to fall asleep for the rest of the night.). Negative for hallucinations and suicidal ideas. The patient is not nervous/anxious.     Immunization History  Administered Date(s) Administered  . Influenza Split 11/06/2011, 11/04/2012  . Influenza,inj,Quad PF,36+ Mos 11/17/2013  . Influenza-Unspecified 11/09/2014  . PPD Test 11/29/2014  . Pneumococcal Conjugate-13 08/11/2014  . Pneumococcal Polysaccharide-23 02/04/2003  . Tdap 02/27/2013  . Zoster 02/03/2006   Pertinent  Health Maintenance Due  Topic Date Due  . OPHTHALMOLOGY EXAM  10/31/1929  . DEXA SCAN  10/31/1984  . FOOT EXAM  11/25/2014  . INFLUENZA VACCINE  09/04/2015  . HEMOGLOBIN A1C  02/27/2016  . PNA vac Low Risk Adult  Completed   Fall Risk  12/06/2014 07/11/2014 05/22/2014 11/07/2013 08/30/2013  Falls in the past year? Yes No Yes No No  Number falls in past yr: 1 - 1 - -  Injury with Fall? No - No - -  Risk for fall due to : History of fall(s);Impaired mobility - History of fall(s);Impaired balance/gait - Impaired balance/gait;Impaired mobility  Follow up - - Falls evaluation completed - -   Functional Status Survey:    Vitals:   09/21/15 1012  BP: (!) 149/62  Pulse: 81  Resp: (!) 21  Temp: 97.8 F (36.6 C)  SpO2: 99%  Weight: 195 lb (88.5 kg)  Height: 5\' 4"  (1.626 m)   Body mass index is 33.47 kg/m. Physical Exam  Labs reviewed:  Recent Labs  11/27/14 0540 11/28/14 0550 11/29/14 0615  03/15/15 03/22/15 08/27/15  NA 128* 128* 129*  < > 129* 135* 137  K 5.3* 5.0 4.8  < > 4.7 4.9 4.5  CL 91* 90* 92*  --   --   --   --   CO2 29 30 29   --   --   --   --  GLUCOSE 188* 277* 216*  --   --   --   --     BUN 31* 35* 27*  < > 22* 22* 30*  CREATININE 0.76 0.87 0.78  < > 0.7 0.6 0.7  CALCIUM 8.7* 8.6* 8.9  --   --   --   --   < > = values in this interval not displayed.  Recent Labs  11/24/14 1013  02/13/15 03/15/15 08/27/15  AST 23  < > 10* 20 14  ALT 24  < > 8 12 11   ALKPHOS 102  < > 68 85 105  BILITOT 0.5  --   --   --   --   PROT 6.3*  --   --   --   --   ALBUMIN 2.8*  --   --   --   --   < > = values in this interval not displayed.  Recent Labs  11/24/14 1013  11/27/14 0812 11/28/14 0550 11/29/14 0615  02/19/15 03/15/15 08/27/15  WBC 17.9*  < > 21.1* 17.4* 16.6*  < > 7.5 11.6 8.5  NEUTROABS 14.8*  --   --   --   --   --   --   --   --   HGB 11.8*  < > 13.2 12.6 13.0  < > 10.0* 10.7* 10.7*  HCT 35.1*  < > 38.7 37.7 39.2  < > 32* 35* 33*  MCV 90.7  < > 90.4 91.1 91.0  --   --   --   --   PLT 263  < > 274 251 245  < > 193 220 249  < > = values in this interval not displayed. Lab Results  Component Value Date   TSH 3.08 08/27/2015   Lab Results  Component Value Date   HGBA1C 8.0 08/27/2015   Lab Results  Component Value Date   CHOL 109 07/19/2015   HDL 75 (A) 07/19/2015   LDLCALC 26 07/19/2015   TRIG 39 (A) 07/19/2015    Assessment/Plan 1. Bronchiectasis without complication ([place]) Continue current medications  2. Edema, unspecified type Stable  3. Essential hypertension Continue current medication  4. Hyponatremia Continue monitoring periodically  5. 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 Chronic combined systolic and diastolic congestive heart failure (HCC) Continue current medication: Losartan, spironolactone, furosemide, metoprolol tartrate

## 2015-10-05 IMAGING — DX DG CHEST 1V PORT
1 series · 1 of 1 positions shown · non-contrast
Comparison: 11/12/2013

CLINICAL DATA: Productive cough, shortness of breath, left-sided
pleuritic chest pain, and fever. Sepsis.

EXAM:
PORTABLE CHEST - 1 VIEW

[chest ap]
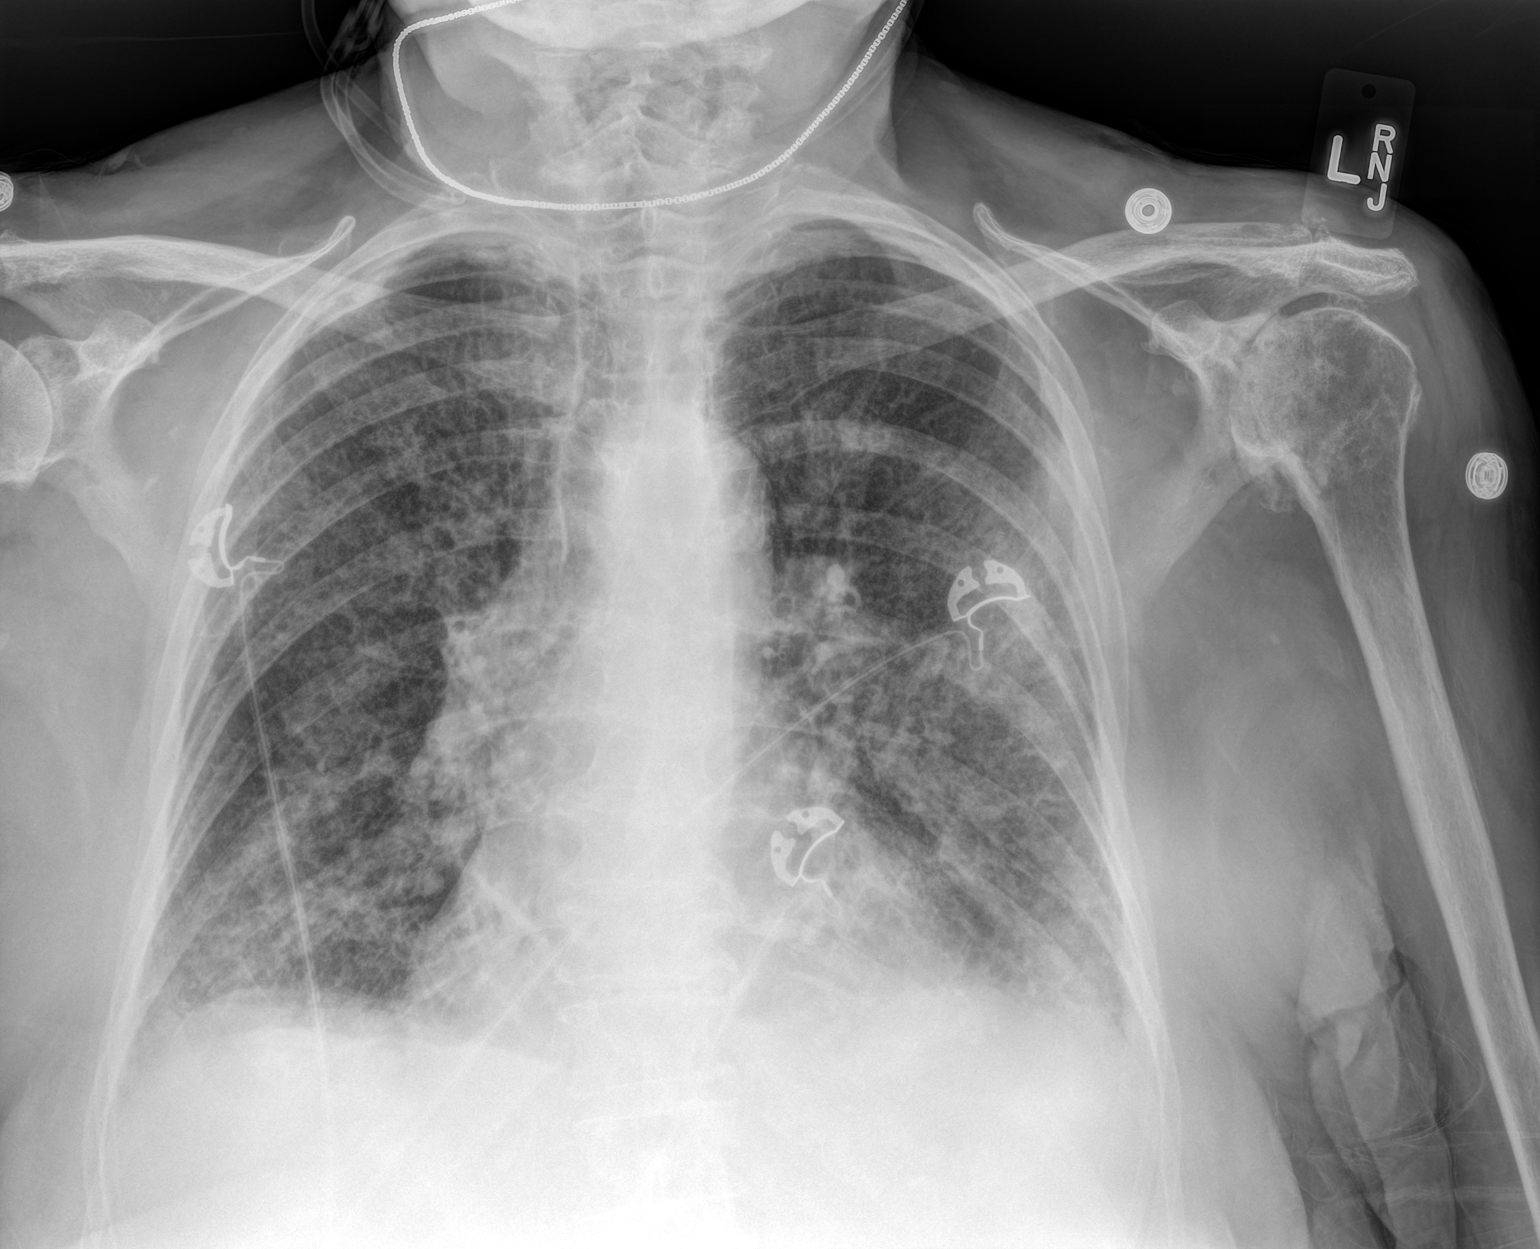

[1 of 1 positions shown; findings below may reference images not displayed]

FINDINGS: Cardiac silhouette remains upper limits of normal in size,
unchanged. Confluent opacity is again seen in the lateral left mid
lung, similar to the prior study. Diffuse bilateral interstitial
opacities have increased, greatest in the lung bases. Small
bilateral pleural effusions are not excluded. No pneumothorax is
identified. Degenerative changes are again seen of the left
glenohumeral joint.
IMPRESSION: Persistent left mid lung consolidation, consistent with pneumonia.
Increasing, diffuse bilateral interstitial opacities could reflect
interstitial pneumonitis or edema.

## 2015-10-17 DIAGNOSIS — Z85828 Personal history of other malignant neoplasm of skin: Secondary | ICD-10-CM | POA: Diagnosis not present

## 2015-10-17 DIAGNOSIS — L570 Actinic keratosis: Secondary | ICD-10-CM | POA: Diagnosis not present

## 2015-10-18 ENCOUNTER — Non-Acute Institutional Stay (SKILLED_NURSING_FACILITY): Payer: Medicare Other | Admitting: Nurse Practitioner

## 2015-10-18 ENCOUNTER — Encounter: Payer: Self-pay | Admitting: Nurse Practitioner

## 2015-10-18 DIAGNOSIS — G4700 Insomnia, unspecified: Secondary | ICD-10-CM | POA: Diagnosis not present

## 2015-10-18 DIAGNOSIS — E1151 Type 2 diabetes mellitus with diabetic peripheral angiopathy without gangrene: Secondary | ICD-10-CM

## 2015-10-18 DIAGNOSIS — K219 Gastro-esophageal reflux disease without esophagitis: Secondary | ICD-10-CM

## 2015-10-18 DIAGNOSIS — I8311 Varicose veins of right lower extremity with inflammation: Secondary | ICD-10-CM | POA: Diagnosis not present

## 2015-10-18 DIAGNOSIS — I10 Essential (primary) hypertension: Secondary | ICD-10-CM | POA: Diagnosis not present

## 2015-10-18 DIAGNOSIS — K5900 Constipation, unspecified: Secondary | ICD-10-CM

## 2015-10-18 DIAGNOSIS — I8312 Varicose veins of left lower extremity with inflammation: Secondary | ICD-10-CM

## 2015-10-18 DIAGNOSIS — I5042 Chronic combined systolic (congestive) and diastolic (congestive) heart failure: Secondary | ICD-10-CM | POA: Diagnosis not present

## 2015-10-18 DIAGNOSIS — D638 Anemia in other chronic diseases classified elsewhere: Secondary | ICD-10-CM | POA: Diagnosis not present

## 2015-10-18 DIAGNOSIS — E871 Hypo-osmolality and hyponatremia: Secondary | ICD-10-CM | POA: Diagnosis not present

## 2015-10-18 DIAGNOSIS — I872 Venous insufficiency (chronic) (peripheral): Secondary | ICD-10-CM

## 2015-10-18 DIAGNOSIS — J410 Simple chronic bronchitis: Secondary | ICD-10-CM

## 2015-10-18 DIAGNOSIS — R609 Edema, unspecified: Secondary | ICD-10-CM | POA: Diagnosis not present

## 2015-10-18 NOTE — Assessment & Plan Note (Addendum)
controlled, continue Cardura 8mg  daily, Metoprolol 25mg  bid, Losartan 50mg  and prn Clonidine 0.1mg  fro Bp>160/100 - On lasix 20mg  and resumed 02/16/15 Spironolactone 25mg  daily, chronic BLE edema - Continue statin  08/27/15 Na 137, K 4.5, Bun 30, creat 0.7, Hgb 10.7, TSH 3.08, Hgb a1c 8.0

## 2015-10-18 NOTE — Progress Notes (Signed)
Location:   [place] of Service:   SNF  Provider:  Jahleah Mariscal  NP  Jeanmarie Hubert, MD  Patient Care Team: Estill Dooms, MD as PCP - General (Internal Medicine) Clent Jacks, MD (Ophthalmology) Adrian Prows, MD as Attending Physician (Cardiology) [facility] [name], MD as Consulting Physician (Orthopedic Surgery) Kathee Delton, MD as Consulting Physician (Pulmonary Disease) Ramari Bray Otho Darner, NP as Nurse Practitioner (Internal Medicine)  Extended Emergency Contact Information Primary Emergency Contact: Mantione,[name] Address: [address]          [place], [place] [address] [place] Phone: [phone] Mobile Phone: [phone] Relation: Son Secondary Emergency Contact: Fuhrman,[name]  [place] Mobile Phone: [phone] Relation: Daughter    Code Status:  DNR Goals of care: Advanced Directive information Advanced Directives 09/21/2015  Does patient have an advance directive? Yes  Type of Paramedic of [place];Living will;Out of facility DNR (pink MOST or yellow form)  Does patient want to make changes to advanced directive? -  Copy of advanced directive(s) in chart? Yes  Pre-existing out of facility DNR order (yellow form or pink MOST form) -     Chief Complaint  Patient presents with  . Medical Management of Chronic Issues    HPI:  Pt is a 82 y.o. female seen today for chronic medical conditions. Hx of BLE chronic venous dermatitis, scratched injury presents. COPD chronic cough, SOB at her baseline. Hgb a1c 8 recently, taking Insulin to manage blood sugar.    Past Medical History:  Diagnosis Date  . Abnormality of gait   . Anemia, unspecified   . Arthritis   . Bronchiectasis without acute exacerbation ([place]) 04/10/2011   CT chest 2011   . Cataract 1990   Dr. Katy Fitch  . Cholelithiases   . Closed fracture of unspecified part of ramus of mandible   . COPD (chronic obstructive pulmonary  disease) ([facility])   . Coronary atherosclerosis of unspecified type of vessel, native or graft   . Disturbance of skin sensation   . DOE (dyspnea on exertion) 04/10/2011   PFTs 2013:  FEV1 1.04 (78%), ratio 64, couldn't do maneuver for lung volumes or dlco   . Edema   . Esophageal reflux   . Fall from other slipping, tripping, or stumbling   . Gastric ulcer with hemorrhage 2008  . Hypertension   . Hypoxemia   . Insomnia, unspecified   . Mucopurulent chronic bronchitis ([place])   . Nodule of neck 11/07/2013   Left neck posteriorly   . Nontoxic uninodular goiter   . Osteoarthrosis, unspecified whether generalized or localized, unspecified site   . Other and unspecified hyperlipidemia   . Other malaise and fatigue   . Pain in joint, lower leg 2010   right knee  . Pain in joint, shoulder region 2013   left  . Palpitations   . Pneumonia, organism unspecified   . Regional enteritis of unspecified site   . Rosacea   . Sciatica   . Squamous cell carcinoma of skin of lower limb, including hip 07/27/2012   Nonhealing lesion of the left mid calf medially   . Tachycardia 12/06/2014  . Tension headache   . Tension headache   . Type II or unspecified type diabetes mellitus without mention of complication, not stated as uncontrolled   . Unspecified venous (peripheral) insufficiency   . Urine, incontinence, stress female 07/19/2013   Past Surgical History:  Procedure  Laterality Date  . EYE SURGERY Bilateral 1990   Dr. Katy Fitch  . GASTROJEJUNOSTOMY  05/2004   secondary to ulcer  . JOINT REPLACEMENT Bilateral 1993-1998   total knee replacement  . MOLE REMOVAL  05/11/14   left hand and left side of face [facility]  . OTHER SURGICAL HISTORY  2008   Ulcer surgery   . SMALL INTESTINE SURGERY    . TOTAL KNEE ARTHROPLASTY Bilateral 1993, 1998   knees    Allergies  Allergen Reactions  . Adhesive [Tape]     unknown  . Aspirin     unknown  . Augmentin [Amoxicillin-Pot Clavulanate]   .  Codeine     unknown  . Diclofenac   . Enalapril Cough  . Hydrocodone     unknown  . Pantoprazole Sodium     unknown  . Voltaren [Diclofenac Sodium]       Medication List       Accurate as of 10/18/15  2:46 PM. Always use your most recent med list.          acetaminophen 500 MG tablet Commonly known as:  TYLENOL Take 500 mg by mouth. Take 2 at bedtime   acetaminophen 325 MG tablet Commonly known as:  TYLENOL Take 650 mg by mouth every 6 (six) hours as needed for moderate pain or headache.   atorvastatin 10 MG tablet Commonly known as:  LIPITOR Take 10 mg by mouth daily.   cloNIDine 0.1 MG tablet Commonly known as:  CATAPRES Take 0.1 mg by mouth. Take one tablet as needed for bp > 160/100   DIABETIC TUSSIN MUCUS RELIEF 200 MG/5ML Liqd Generic drug:  Guaifenesin Take by mouth. Take 10 ml every 6 hours as needed for cough   doxazosin 8 MG tablet Commonly known as:  CARDURA Take 8 mg by mouth daily. To control BP.   furosemide 20 MG tablet Commonly known as:  LASIX Take 40 mg by mouth daily.   HUMALOG 100 UNIT/ML cartridge Generic drug:  insulin lispro Inject 8 Units into the skin 3 (three) times daily with meals. For CBG >150   insulin glargine 100 UNIT/ML injection Commonly known as:  LANTUS Inject 15 Units into the skin at bedtime.   ipratropium-albuterol 0.5-2.5 (3) MG/3ML Soln Commonly known as:  DUONEB Take 3 mLs by nebulization every 6 (six) hours as needed.   losartan 50 MG tablet Commonly known as:  COZAAR Take 50 mg by mouth daily.   metoprolol tartrate 25 MG tablet Commonly known as:  LOPRESSOR Take 25 mg by mouth 2 (two) times daily.   multivitamin with minerals tablet Take 1 tablet by mouth daily.   omeprazole 20 MG capsule Commonly known as:  PRILOSEC Take 20 mg by mouth daily.   polyethylene glycol packet Commonly known as:  MIRALAX / GLYCOLAX Take 17 g by mouth daily as needed.   predniSONE 5 MG tablet Commonly known as:   DELTASONE Take 5 mg by mouth daily.   promethazine 25 MG tablet Commonly known as:  PHENERGAN Take 25 mg by mouth every 4 (four) hours as needed for nausea or vomiting. Reported on 07/03/2015   promethazine 25 MG/ML injection Commonly known as:  PHENERGAN Inject 25 mg into the vein every 4 (four) hours as needed. Reported on 07/03/2015   spironolactone 25 MG tablet Commonly known as:  ALDACTONE Take 25 mg by mouth daily.   zolpidem 5 MG tablet Commonly known as:  AMBIEN Take 5 mg by mouth at  bedtime as needed.       Review of Systems  Constitutional: Negative for chills, diaphoresis and fever.       Baseline mobility is motorized scooter  HENT: Positive for hearing loss and tinnitus. Negative for congestion, ear discharge and ear pain.   Eyes: Negative for photophobia and pain.  Respiratory: Positive for cough and shortness of breath. Negative for wheezing.        Lungs generally sound terrible. She has chronic bronchiectasis which leads to a persistent bronchial rattle, wheeze, and cough. Improved cough and SOB, mild wheezes on exertion  Cardiovascular: Positive for leg swelling. Negative for chest pain and palpitations.       1+ edema BLE,  Right knee pain.   Gastrointestinal: Positive for constipation. Negative for abdominal pain, diarrhea, nausea and vomiting.  Genitourinary: Positive for dyspareunia and frequency. Negative for dysuria and urgency.       Recurrent urinary tract infections. Nocturia. Urinary frequency.  Musculoskeletal: Negative for back pain, myalgias and neck pain.       R knee pain  Skin: Positive for rash.       Itching around neck. Chronic venous dermatitis BLE  Allergic/Immunologic: Negative for environmental allergies.  Neurological: Negative for dizziness, tremors, seizures, weakness and headaches.       Left lateral occipital area hot flashes  Psychiatric/Behavioral: Positive for sleep disturbance (Insomnia. He will fall asleep, but consistently  wakes up at 1:30 AM and then finds it difficult to fall asleep for the rest of the night.). Negative for hallucinations and suicidal ideas. The patient is not nervous/anxious.     Immunization History  Administered Date(s) Administered  . Influenza Split 11/06/2011, 11/04/2012  . Influenza,inj,Quad PF,36+ Mos 11/17/2013  . Influenza-Unspecified 11/09/2014  . PPD Test 11/29/2014  . Pneumococcal Conjugate-13 08/11/2014  . Pneumococcal Polysaccharide-23 02/04/2003  . Tdap 02/27/2013  . Zoster 02/03/2006   Pertinent  Health Maintenance Due  Topic Date Due  . OPHTHALMOLOGY EXAM  10/31/1929  . DEXA SCAN  10/31/1984  . FOOT EXAM  11/25/2014  . INFLUENZA VACCINE  09/04/2015  . HEMOGLOBIN A1C  02/27/2016  . PNA vac Low Risk Adult  Completed   Fall Risk  12/06/2014 07/11/2014 05/22/2014 11/07/2013 08/30/2013  Falls in the past year? Yes No Yes No No  Number falls in past yr: 1 - 1 - -  Injury with Fall? No - No - -  Risk for fall due to : History of fall(s);Impaired mobility - History of fall(s);Impaired balance/gait - Impaired balance/gait;Impaired mobility  Follow up - - Falls evaluation completed - -   Functional Status Survey:    Vitals:   10/18/15 1433  BP: (!) 139/56  Pulse: 72  Resp: (!) 22  Temp: 97.6 F (36.4 C)  SpO2: 98%   There is no height or weight on file to calculate BMI. Physical Exam  Labs reviewed:  Recent Labs  11/27/14 0540 11/28/14 0550 11/29/14 0615  03/15/15 03/22/15 08/27/15  NA 128* 128* 129*  < > 129* 135* 137  K 5.3* 5.0 4.8  < > 4.7 4.9 4.5  CL 91* 90* 92*  --   --   --   --   CO2 29 30 29   --   --   --   --   GLUCOSE 188* 277* 216*  --   --   --   --   BUN 31* 35* 27*  < > 22* 22* 30*  CREATININE 0.76 0.87 0.78  < >  0.7 0.6 0.7  CALCIUM 8.7* 8.6* 8.9  --   --   --   --   < > = values in this interval not displayed.  Recent Labs  11/24/14 1013  02/13/15 03/15/15 08/27/15  AST 23  < > 10* 20 14  ALT 24  < > 8 12 11   ALKPHOS 102  < > 68 85  105  BILITOT 0.5  --   --   --   --   PROT 6.3*  --   --   --   --   ALBUMIN 2.8*  --   --   --   --   < > = values in this interval not displayed.  Recent Labs  11/24/14 1013  11/27/14 0812 11/28/14 0550 11/29/14 0615  02/19/15 03/15/15 08/27/15  WBC 17.9*  < > 21.1* 17.4* 16.6*  < > 7.5 11.6 8.5  NEUTROABS 14.8*  --   --   --   --   --   --   --   --   HGB 11.8*  < > 13.2 12.6 13.0  < > 10.0* 10.7* 10.7*  HCT 35.1*  < > 38.7 37.7 39.2  < > 32* 35* 33*  MCV 90.7  < > 90.4 91.1 91.0  --   --   --   --   PLT 263  < > 274 251 245  < > 193 220 249  < > = values in this interval not displayed. Lab Results  Component Value Date   TSH 3.08 08/27/2015   Lab Results  Component Value Date   HGBA1C 8.0 08/27/2015   Lab Results  Component Value Date   CHOL 109 07/19/2015   HDL 75 (A) 07/19/2015   LDLCALC 26 07/19/2015   TRIG 39 (A) 07/19/2015    Significant Diagnostic Results in last 30 days:  No results found.  Assessment/Plan There are no diagnoses linked to this encounter.Hypertension controlled, continue Cardura 8mg  daily, Metoprolol 25mg  bid, Losartan 50mg  and prn Clonidine 0.1mg  fro Bp>160/100 - On lasix 20mg  and resumed 02/16/15 Spironolactone 25mg  daily, chronic BLE edema - Continue statin  08/27/15 Na 137, K 4.5, Bun 30, creat 0.7, Hgb 10.7, TSH 3.08, Hgb a1c 8.0     Chronic combined systolic and diastolic congestive heart failure (HCC) BLE, chronic, continue Furosemide 40mg  daily, Spironolactone 25mg , monitor for s/s of decompensation of CHF.  08/27/15 Na 137, K 4.5, Bun 30, creat 0.7, Hgb 10.7, TSH 3.08, Hgb a1c 8.0    COPD (chronic obstructive pulmonary disease) (HCC) Continue O2 Exira, Neb prn for wheezes, prednisone 5mg , Diabetic Tussin prn. 08/27/15 Na 137, K 4.5, Bun 30, creat 0.7, Hgb 10.7, TSH 3.08, Hgb a1c 8.0   Esophageal reflux Stable, continue Omeprazole 20mg  daily   Constipation Stable, continue MiraLax daily.     Type 2 diabetes with decreased  circulation (HCC) Continue Lantus 15u qd, increase Humalog 8u with meals, total 8u if CBG>150, 08/27/15 /17 Hgb A1c 8.0  Venous stasis dermatitis of both lower extremities Mycolog II cream bid. Also c/o itching neck, may apply to the affected area until healed.   Edema 08/27/15 Na 137, K 4.5, Bun 30, creat 0.7, Hgb 10.7, TSH 3.08, Hgb a1c 8.0 Continue Furosemide   Insomnia Stable, continue Ambien prn  Hyponatremia 12/29/14 Na 136 02/13/15 Na 131 02/19/15 Na 134 03/15/15 Na 129 03/22/15 Na 135 08/27/15 Na 137 CHF vs Prednisone taper dose. Monitor Na    Anemia of chronic disease Stable,  Hgb 10.7 08/27/15    Family/ staff Communication: continue SNF for care needs.   Labs/tests ordered: none

## 2015-10-18 NOTE — Assessment & Plan Note (Signed)
Stable, continue Ambien prn  

## 2015-10-18 NOTE — Assessment & Plan Note (Signed)
08/27/15 Na 137, K 4.5, Bun 30, creat 0.7, Hgb 10.7, TSH 3.08, Hgb a1c 8.0 Continue Furosemide

## 2015-10-18 NOTE — Assessment & Plan Note (Signed)
Continue Lantus 15u qd, increase Humalog 8u with meals, total 8u if CBG>150, 08/27/15 /17 Hgb A1c 8.0

## 2015-10-18 NOTE — Assessment & Plan Note (Signed)
Stable, continue Omeprazole 20mg daily.  

## 2015-10-18 NOTE — Assessment & Plan Note (Signed)
Stable, Hgb 10.7 08/27/15

## 2015-10-18 NOTE — Assessment & Plan Note (Signed)
Continue O2 Bessie, Neb prn for wheezes, prednisone 5mg , Diabetic Tussin prn. 08/27/15 Na 137, K 4.5, Bun 30, creat 0.7, Hgb 10.7, TSH 3.08, Hgb a1c 8.0

## 2015-10-18 NOTE — Assessment & Plan Note (Signed)
BLE, chronic, continue Furosemide 40mg  daily, Spironolactone 25mg , monitor for s/s of decompensation of CHF.  08/27/15 Na 137, K 4.5, Bun 30, creat 0.7, Hgb 10.7, TSH 3.08, Hgb a1c 8.0

## 2015-10-18 NOTE — Assessment & Plan Note (Signed)
Mycolog II cream bid. Also c/o itching neck, may apply to the affected area until healed.

## 2015-10-18 NOTE — Assessment & Plan Note (Addendum)
12/29/14 Na 136 02/13/15 Na 131 02/19/15 Na 134 03/15/15 Na 129 03/22/15 Na 135 08/27/15 Na 137 CHF vs Prednisone taper dose. Monitor Na

## 2015-10-18 NOTE — Assessment & Plan Note (Signed)
Stable, continue MiraLax daily.  

## 2015-11-23 ENCOUNTER — Non-Acute Institutional Stay (SKILLED_NURSING_FACILITY): Payer: Medicare Other | Admitting: Nurse Practitioner

## 2015-11-23 ENCOUNTER — Encounter: Payer: Self-pay | Admitting: Nurse Practitioner

## 2015-11-23 DIAGNOSIS — R609 Edema, unspecified: Secondary | ICD-10-CM | POA: Diagnosis not present

## 2015-11-23 DIAGNOSIS — E1151 Type 2 diabetes mellitus with diabetic peripheral angiopathy without gangrene: Secondary | ICD-10-CM

## 2015-11-23 DIAGNOSIS — I10 Essential (primary) hypertension: Secondary | ICD-10-CM | POA: Diagnosis not present

## 2015-11-23 DIAGNOSIS — K219 Gastro-esophageal reflux disease without esophagitis: Secondary | ICD-10-CM | POA: Diagnosis not present

## 2015-11-23 DIAGNOSIS — J410 Simple chronic bronchitis: Secondary | ICD-10-CM | POA: Diagnosis not present

## 2015-11-23 DIAGNOSIS — G4700 Insomnia, unspecified: Secondary | ICD-10-CM | POA: Diagnosis not present

## 2015-11-23 DIAGNOSIS — K5900 Constipation, unspecified: Secondary | ICD-10-CM | POA: Diagnosis not present

## 2015-11-23 DIAGNOSIS — D638 Anemia in other chronic diseases classified elsewhere: Secondary | ICD-10-CM | POA: Diagnosis not present

## 2015-11-23 DIAGNOSIS — I5042 Chronic combined systolic (congestive) and diastolic (congestive) heart failure: Secondary | ICD-10-CM | POA: Diagnosis not present

## 2015-11-23 DIAGNOSIS — E871 Hypo-osmolality and hyponatremia: Secondary | ICD-10-CM | POA: Diagnosis not present

## 2015-11-23 DIAGNOSIS — R05 Cough: Secondary | ICD-10-CM | POA: Diagnosis not present

## 2015-11-23 NOTE — Assessment & Plan Note (Signed)
Continue Lantus 15u qd, increase Humalog 8u with meals, total 8u if CBG>150, 08/27/15 /17 Hgb A1c 8.0

## 2015-11-23 NOTE — Assessment & Plan Note (Signed)
Stable, continue Omeprazole 20mg daily.  

## 2015-11-23 NOTE — Assessment & Plan Note (Signed)
08/27/15 Na 137

## 2015-11-23 NOTE — Assessment & Plan Note (Signed)
08/27/15 Na 137, K 4.5, Bun 30, creat 0.7, Hgb 10.7, TSH 3.08, Hgb a1c 8.0 Continue Furosemide and Spironolactone.

## 2015-11-23 NOTE — Progress Notes (Signed)
Location:  [place] Room Number: 16 Place of Service:  SNF (31) Provider:  Mauricio Dahlen, [place]  NP  Jeanmarie Hubert, MD  Patient Care Team: Estill Dooms, MD as PCP - General (Internal Medicine) Clent Jacks, MD (Ophthalmology) Adrian Prows, MD as Attending Physician (Cardiology) [facility] [name], MD as Consulting Physician (Orthopedic Surgery) Kathee Delton, MD as Consulting Physician (Pulmonary Disease) Deisy Ozbun Otho Darner, NP as Nurse Practitioner (Internal Medicine)  Extended Emergency Contact Information Primary Emergency Contact: Lapier,[name] Address: [address]          [place], [place] [address] [place] Phone: [phone] Mobile Phone: [phone] Relation: Son Secondary Emergency Contact: Lierman,[name]  [place] Mobile Phone: [phone] Relation: Daughter  Code Status:  DNR Goals of care: Advanced Directive information Advanced Directives 11/23/2015  Does patient have an advance directive? Yes  Type of Paramedic of [place];Living will;Out of facility DNR (pink MOST or yellow form)  Does patient want to make changes to advanced directive? No - Patient declined  Copy of advanced directive(s) in chart? Yes  Pre-existing out of facility DNR order (yellow form or pink MOST form) -     Chief Complaint  Patient presents with  . Acute Visit    cough, congestion    HPI:  Pt is a 82 y.o. female seen today for an acute visit for congestion and cough. Afebrile, O2 dependent. Denied chest pain or increased phlegm production.   Hx of BLE chronic venous dermatitis, takes Furosemide 40mg , Spironolactone 25mg . COPD chronic SOB at her baseline. Hgb a1c 8 recently, taking Insulin to manage blood sugar.   Past Medical History:  Diagnosis Date  . Abnormality of gait   . Anemia, unspecified   . Arthritis   . Bronchiectasis without acute exacerbation ([place]) 04/10/2011   CT chest 2011   .  Cataract 1990   Dr. Katy Fitch  . Cholelithiases   . Closed fracture of unspecified part of ramus of mandible   . COPD (chronic obstructive pulmonary disease) ([place])   . Coronary atherosclerosis of unspecified type of vessel, native or graft   . Disturbance of skin sensation   . DOE (dyspnea on exertion) 04/10/2011   PFTs 2013:  FEV1 1.04 (78%), ratio 64, couldn't do maneuver for lung volumes or dlco   . Edema   . Esophageal reflux   . Fall from other slipping, tripping, or stumbling   . Gastric ulcer with hemorrhage 2008  . Hypertension   . Hypoxemia   . Insomnia, unspecified   . Mucopurulent chronic bronchitis ([place])   . Nodule of neck 11/07/2013   Left neck posteriorly   . Nontoxic uninodular goiter   . Osteoarthrosis, unspecified whether generalized or localized, unspecified site   . Other and unspecified hyperlipidemia   . Other malaise and fatigue   . Pain in joint, lower leg 2010   right knee  . Pain in joint, shoulder region 2013   left  . Palpitations   . Pneumonia, organism unspecified(486)   . Regional enteritis of unspecified site   . Rosacea   . Sciatica   . Squamous cell carcinoma of skin of lower limb, including hip 07/27/2012   Nonhealing lesion of the left mid calf medially   . Tachycardia 12/06/2014  . Tension headache   . Tension headache   . Type II or unspecified type diabetes mellitus without mention of complication, not stated as uncontrolled   .  Unspecified venous (peripheral) insufficiency   . Urine, incontinence, stress female 07/19/2013   Past Surgical History:  Procedure Laterality Date  . EYE SURGERY Bilateral 1990   Dr. Katy Fitch  . GASTROJEJUNOSTOMY  05/2004   secondary to ulcer  . JOINT REPLACEMENT Bilateral 1993-1998   total knee replacement  . MOLE REMOVAL  05/11/14   left hand and left side of face [facility]  . OTHER SURGICAL HISTORY  2008   Ulcer surgery   . SMALL INTESTINE SURGERY    . TOTAL KNEE ARTHROPLASTY Bilateral 1993, 1998    knees    Allergies  Allergen Reactions  . Adhesive [Tape]     unknown  . Aspirin     unknown  . Augmentin [Amoxicillin-Pot Clavulanate]   . Codeine     unknown  . Diclofenac   . Enalapril Cough  . Hydrocodone     unknown  . Pantoprazole Sodium     unknown  . Voltaren [Diclofenac Sodium]       Medication List       Accurate as of 11/23/15  3:37 PM. Always use your most recent med list.          acetaminophen 500 MG tablet Commonly known as:  TYLENOL Take 500 mg by mouth. Take 2 at bedtime   acetaminophen 325 MG tablet Commonly known as:  TYLENOL Take 650 mg by mouth every 6 (six) hours as needed for moderate pain or headache.   atorvastatin 10 MG tablet Commonly known as:  LIPITOR Take 10 mg by mouth daily.   cloNIDine 0.1 MG tablet Commonly known as:  CATAPRES Take 0.1 mg by mouth. Take one tablet as needed for bp > 160/100   doxazosin 8 MG tablet Commonly known as:  CARDURA Take 8 mg by mouth daily. To control BP.   furosemide 40 MG tablet Commonly known as:  LASIX Take 40 mg by mouth daily.   HUMALOG 100 UNIT/ML cartridge Generic drug:  insulin lispro Inject 8 Units into the skin 3 (three) times daily with meals. For CBG >150   insulin glargine 100 UNIT/ML injection Commonly known as:  LANTUS Inject 15 Units into the skin at bedtime.   ipratropium-albuterol 0.5-2.5 (3) MG/3ML Soln Commonly known as:  DUONEB Take 3 mLs by nebulization every 6 (six) hours as needed.   losartan 50 MG tablet Commonly known as:  COZAAR Take 50 mg by mouth daily.   metoprolol tartrate 25 MG tablet Commonly known as:  LOPRESSOR Take 25 mg by mouth 2 (two) times daily.   multivitamin with minerals tablet Take 1 tablet by mouth daily.   omeprazole 20 MG capsule Commonly known as:  PRILOSEC Take 20 mg by mouth daily.   predniSONE 5 MG tablet Commonly known as:  DELTASONE Take 5 mg by mouth daily.   spironolactone 25 MG tablet Commonly known as:   ALDACTONE Take 25 mg by mouth daily.   zolpidem 5 MG tablet Commonly known as:  AMBIEN Take 5 mg by mouth at bedtime as needed.       Review of Systems  Constitutional: Negative for chills, diaphoresis and fever.       Baseline mobility is motorized scooter  HENT: Positive for hearing loss and tinnitus. Negative for congestion, ear discharge and ear pain.   Eyes: Negative for photophobia and pain.  Respiratory: Positive for cough and shortness of breath. Negative for wheezing.        Lungs generally sound terrible. She has chronic bronchiectasis  which leads to a persistent bronchial rattle, wheeze, and cough. Improved cough and SOB, mild wheezes on exertion  Cardiovascular: Positive for leg swelling. Negative for chest pain and palpitations.       1+ edema BLE,  Right knee pain.   Gastrointestinal: Positive for constipation. Negative for abdominal pain, diarrhea, nausea and vomiting.  Genitourinary: Positive for dyspareunia and frequency. Negative for dysuria and urgency.       Recurrent urinary tract infections. Nocturia. Urinary frequency.  Musculoskeletal: Negative for back pain, myalgias and neck pain.       R knee pain  Skin: Positive for rash.       Itching around neck. Chronic venous dermatitis BLE  Allergic/Immunologic: Negative for environmental allergies.  Neurological: Negative for dizziness, tremors, seizures, weakness and headaches.       Left lateral occipital area hot flashes  Psychiatric/Behavioral: Positive for sleep disturbance (Insomnia. He will fall asleep, but consistently wakes up at 1:30 AM and then finds it difficult to fall asleep for the rest of the night.). Negative for hallucinations and suicidal ideas. The patient is not nervous/anxious.     Immunization History  Administered Date(s) Administered  . Influenza Split 11/06/2011, 11/04/2012  . Influenza,inj,Quad PF,36+ Mos 11/17/2013  . Influenza-Unspecified 11/09/2014, 11/16/2015  . PPD Test 11/29/2014   . Pneumococcal Conjugate-13 08/11/2014  . Pneumococcal Polysaccharide-23 02/04/2003  . Tdap 02/27/2013  . Zoster 02/03/2006   Pertinent  Health Maintenance Due  Topic Date Due  . OPHTHALMOLOGY EXAM  10/31/1929  . DEXA SCAN  10/31/1984  . FOOT EXAM  11/25/2014  . HEMOGLOBIN A1C  02/27/2016  . INFLUENZA VACCINE  Completed  . PNA vac Low Risk Adult  Completed   Fall Risk  12/06/2014 07/11/2014 05/22/2014 11/07/2013 08/30/2013  Falls in the past year? Yes No Yes No No  Number falls in past yr: 1 - 1 - -  Injury with Fall? No - No - -  Risk for fall due to : History of fall(s);Impaired mobility - History of fall(s);Impaired balance/gait - Impaired balance/gait;Impaired mobility  Follow up - - Falls evaluation completed - -   Functional Status Survey:    Vitals:   11/23/15 1002  BP: (!) 144/84  Pulse: 82  Resp: 20  Temp: 97.7 F (36.5 C)  Weight: 195 lb (88.5 kg)  Height: 5\' 4"  (1.626 m)   Body mass index is 33.47 kg/m. Physical Exam  Constitutional: She is oriented to person, place, and time. She appears well-developed and well-nourished. No distress.  HENT:  Head: Normocephalic and atraumatic.  Right Ear: External ear normal.  Left Ear: External ear normal.  Nose: Nose normal.  Eyes: Conjunctivae and EOM are normal. Pupils are equal, round, and reactive to light.  Neck: Neck supple. No JVD present. No tracheal deviation present. No thyromegaly present.  Cardiovascular: Normal rate, regular rhythm, normal heart sounds and intact distal pulses.  Exam reveals no gallop and no friction rub.   No murmur heard. Pulmonary/Chest: No respiratory distress. She has wheezes. She has rales.  Moist rales posterior mid to lower lungs. Trace edema BLE. Mild exertional wheezes  Abdominal: Bowel sounds are normal. She exhibits no distension and no mass. There is no tenderness.  Musculoskeletal: She exhibits edema (2-3+ bipedal) and tenderness.  Scars from bilateral TKR. Right knee seems to  be loose when stressing the joint. No effusion. Instable gait. Driving a power scooter 2+ edema BLE Chronic R knee pain  Lymphadenopathy:    She has no cervical adenopathy.  Neurological: She is alert and oriented to person, place, and time. She has normal reflexes. No cranial nerve deficit. Coordination normal.  Diminished vibratory and monofilament testing in both feet.  Skin: No rash (Reddened rash on both legs. Some itching and burning associated with the rash.) noted. No erythema. No pallor.  Prior exam: 6 mm nodule of the left posterior neck at the SCM muscle seems resolved Likely SCC left leg medial to shin.  Psychiatric: She has a normal mood and affect. Her behavior is normal. Judgment and thought content normal.    Labs reviewed:  Recent Labs  11/27/14 0540 11/28/14 0550 11/29/14 0615  03/15/15 03/22/15 08/27/15  NA 128* 128* 129*  < > 129* 135* 137  K 5.3* 5.0 4.8  < > 4.7 4.9 4.5  CL 91* 90* 92*  --   --   --   --   CO2 29 30 29   --   --   --   --   GLUCOSE 188* 277* 216*  --   --   --   --   BUN 31* 35* 27*  < > 22* 22* 30*  CREATININE 0.76 0.87 0.78  < > 0.7 0.6 0.7  CALCIUM 8.7* 8.6* 8.9  --   --   --   --   < > = values in this interval not displayed.  Recent Labs  11/24/14 1013  02/13/15 03/15/15 08/27/15  AST 23  < > 10* 20 14  ALT 24  < > 8 12 11   ALKPHOS 102  < > 68 85 105  BILITOT 0.5  --   --   --   --   PROT 6.3*  --   --   --   --   ALBUMIN 2.8*  --   --   --   --   < > = values in this interval not displayed.  Recent Labs  11/24/14 1013  11/27/14 0812 11/28/14 0550 11/29/14 0615  02/19/15 03/15/15 08/27/15  WBC 17.9*  < > 21.1* 17.4* 16.6*  < > 7.5 11.6 8.5  NEUTROABS 14.8*  --   --   --   --   --   --   --   --   HGB 11.8*  < > 13.2 12.6 13.0  < > 10.0* 10.7* 10.7*  HCT 35.1*  < > 38.7 37.7 39.2  < > 32* 35* 33*  MCV 90.7  < > 90.4 91.1 91.0  --   --   --   --   PLT 263  < > 274 251 245  < > 193 220 249  < > = values in this interval not  displayed. Lab Results  Component Value Date   TSH 3.08 08/27/2015   Lab Results  Component Value Date   HGBA1C 8.0 08/27/2015   Lab Results  Component Value Date   CHOL 109 07/19/2015   HDL 75 (A) 07/19/2015   LDLCALC 26 07/19/2015   TRIG 39 (A) 07/19/2015    Significant Diagnostic Results in last 30 days:  No results found.  Assessment/Plan Hypertension controlled, continue Cardura 8mg  daily, Metoprolol 25mg  bid, Losartan 50mg  and prn Clonidine 0.1mg  fro Bp>160/100 - On lasix 40mg , Spironolactone 25mg  daily, chronic BLE edema - Continue statin  08/27/15 Na 137, K 4.5, Bun 30, creat 0.7, Hgb 10.7, TSH 3.08, Hgb a1c 8.0    Chronic combined systolic and diastolic congestive heart failure (HCC) BLE, chronic, continue Furosemide 40mg  daily, Spironolactone 25mg ,  monitor for s/s of decompensation of CHF.  08/27/15 Na 137, K 4.5, Bun 30, creat 0.7, Hgb 10.7, TSH 3.08, Hgb a1c 8.0   COPD (chronic obstructive pulmonary disease) (HCC) Continue O2 [place] prn for wheezes, prednisone 5mg , Diabetic Tussin prn. 08/27/15 Na 137, K 4.5, Bun 30, creat 0.7, Hgb 10.7, TSH 3.08, Hgb a1c 8.0 CXR to r/o PNA in setting of c/o cough and congestion. Update CBC and CMP Levaquin 500mg  daily x 14 days, Prednisone taper dose    Esophageal reflux Stable, continue Omeprazole 20mg  daily    Constipation Stable, off prn MiraLax  Type 2 diabetes with decreased circulation (HCC) Continue Lantus 15u qd, increase Humalog 8u with meals, total 8u if CBG>150, 08/27/15 /17 Hgb A1c 8.0   Edema 08/27/15 Na 137, K 4.5, Bun 30, creat 0.7, Hgb 10.7, TSH 3.08, Hgb a1c 8.0 Continue Furosemide and Spironolactone.   Insomnia Stable, continue Ambien prn   Hyponatremia 08/27/15 Na 137  Anemia of chronic disease 08/27/15 Hgb 10.7     Family/ staff Communication:continue SNF for care assistance.   Labs/tests ordered:  CBC, CMP, CXR

## 2015-11-23 NOTE — Assessment & Plan Note (Signed)
08/27/15 Hgb 10.7

## 2015-11-23 NOTE — Assessment & Plan Note (Signed)
Stable, off prn MiraLax

## 2015-11-23 NOTE — Assessment & Plan Note (Signed)
controlled, continue Cardura 8mg  daily, Metoprolol 25mg  bid, Losartan 50mg  and prn Clonidine 0.1mg  fro Bp>160/100 - On lasix 40mg , Spironolactone 25mg  daily, chronic BLE edema - Continue statin  08/27/15 Na 137, K 4.5, Bun 30, creat 0.7, Hgb 10.7, TSH 3.08, Hgb a1c 8.0

## 2015-11-23 NOTE — Assessment & Plan Note (Signed)
BLE, chronic, continue Furosemide 40mg  daily, Spironolactone 25mg , monitor for s/s of decompensation of CHF.  08/27/15 Na 137, K 4.5, Bun 30, creat 0.7, Hgb 10.7, TSH 3.08, Hgb a1c 8.0

## 2015-11-23 NOTE — Assessment & Plan Note (Signed)
Stable, continue Ambien prn  

## 2015-11-23 NOTE — Assessment & Plan Note (Addendum)
Continue O2 [place] prn for wheezes, prednisone 5mg , Diabetic Tussin prn. 08/27/15 Na 137, K 4.5, Bun 30, creat 0.7, Hgb 10.7, TSH 3.08, Hgb a1c 8.0 CXR to r/o PNA in setting of c/o cough and congestion. Update CBC and CMP Levaquin 500mg  daily x 14 days, Prednisone taper dose

## 2015-11-26 DIAGNOSIS — D7289 Other specified disorders of white blood cells: Secondary | ICD-10-CM | POA: Diagnosis not present

## 2015-11-26 DIAGNOSIS — E871 Hypo-osmolality and hyponatremia: Secondary | ICD-10-CM | POA: Diagnosis not present

## 2015-11-26 LAB — CBC AND DIFFERENTIAL
HEMATOCRIT: 31 % — AB (ref 36–46)
HEMOGLOBIN: 10.2 g/dL — AB (ref 12.0–16.0)
PLATELETS: 221 10*3/uL (ref 150–399)
WBC: 7.7 10*3/mL

## 2015-11-26 LAB — BASIC METABOLIC PANEL
BUN: 35 mg/dL — AB (ref 4–21)
CREATININE: 0.9 mg/dL (ref 0.5–1.1)
Glucose: 256 mg/dL
Potassium: 5.5 mmol/L — AB (ref 3.4–5.3)
Sodium: 133 mmol/L — AB (ref 137–147)

## 2015-11-26 LAB — HEPATIC FUNCTION PANEL
ALK PHOS: 94 U/L (ref 25–125)
ALT: 15 U/L (ref 7–35)
AST: 18 U/L (ref 13–35)
Bilirubin, Total: 0.3 mg/dL

## 2015-11-29 ENCOUNTER — Non-Acute Institutional Stay (SKILLED_NURSING_FACILITY): Payer: Medicare Other | Admitting: Internal Medicine

## 2015-11-29 ENCOUNTER — Encounter: Payer: Self-pay | Admitting: Internal Medicine

## 2015-11-29 DIAGNOSIS — J410 Simple chronic bronchitis: Secondary | ICD-10-CM

## 2015-11-29 DIAGNOSIS — E1151 Type 2 diabetes mellitus with diabetic peripheral angiopathy without gangrene: Secondary | ICD-10-CM | POA: Diagnosis not present

## 2015-11-29 DIAGNOSIS — I10 Essential (primary) hypertension: Secondary | ICD-10-CM

## 2015-11-29 NOTE — Progress Notes (Signed)
Progress Note    Location:  [facility] Room Number: [place] of Service:  SNF [phone]) Provider:  Jeanmarie Hubert, MD  Patient Care Team: Estill Dooms, MD as PCP - General (Internal Medicine) Clent Jacks, MD (Ophthalmology) Adrian Prows, MD as Attending Physician (Cardiology) [facility] [name], MD as Consulting Physician (Orthopedic Surgery) Kathee Delton, MD as Consulting Physician (Pulmonary Disease) Man Otho Darner, NP as Nurse Practitioner (Internal Medicine)  Extended Emergency Contact Information Primary Emergency Contact: Siedlecki,[name] Address: [address]          [place], [place] [address] [place] Phone: [phone] Mobile Phone: [phone] Relation: Son Secondary Emergency Contact: Creasy,[name]  [place] Mobile Phone: [phone] Relation: Daughter  Code Status:  DNR Goals of care: Advanced Directive information Advanced Directives 11/29/2015  Does patient have an advance directive? Yes  Type of Advance Directive Living will;Healthcare Power of Attorney  Does patient want to make changes to advanced directive? -  Copy of advanced directive(s) in chart? Yes  Pre-existing out of facility DNR order (yellow form or pink MOST form) Yellow form placed in chart (order not valid for inpatient use)     Chief Complaint  Patient presents with  . Acute Visit    hyperglycemide    HPI:  Pt is a 82 y.o. female seen today for an acute visit for evaluation of hyperglycemia. She has been on a high dose prednisone treatment for an acute deterioration of her respiratory status . She is on a taoering dose that started at 60 mg daily. She is down to her last 2 days of 20 mg prednisone daily, then she will resume her usual 5 mg dose. Soon after the high dose was started, she began running amorning glucose near 400 mg%. Glucose gets closer to normal later in the day She is on insulin and gets Lantus in the evening  anf up to tid dosing of 8 units Humalog of glucose AC meals is >150 mg%.  I regards to her respiratory status, she says she is feeling better. She continues to cough, but it is improving. She also continues to wheeze. This is a very chronic condition.   Past Medical History:  Diagnosis Date  . Abnormality of gait   . Anemia, unspecified   . Arthritis   . Bronchiectasis without acute exacerbation ([place]) 04/10/2011   CT chest 2011   . Cataract 1990   Dr. Katy Fitch  . Cholelithiases   . Closed fracture of unspecified part of ramus of mandible   . COPD (chronic obstructive pulmonary disease) ([place])   . Coronary atherosclerosis of unspecified type of vessel, native or graft   . Disturbance of skin sensation   . DOE (dyspnea on exertion) 04/10/2011   PFTs 2013:  FEV1 1.04 (78%), ratio 64, couldn't do maneuver for lung volumes or dlco   . Edema   . Esophageal reflux   . Fall from other slipping, tripping, or stumbling   . Gastric ulcer with hemorrhage 2008  . Hypertension   . Hypoxemia   . Insomnia, unspecified   . Mucopurulent chronic bronchitis ([place])   . Nodule of neck 11/07/2013   Left neck posteriorly   . Nontoxic uninodular goiter   . Osteoarthrosis, unspecified whether generalized or localized, unspecified site   . Other and unspecified hyperlipidemia   . Other malaise and fatigue   . Pain in joint, lower leg 2010   right knee  .  Pain in joint, shoulder region 2013   left  . Palpitations   . Pneumonia, organism unspecified(486)   . Regional enteritis of unspecified site   . Rosacea   . Sciatica   . Squamous cell carcinoma of skin of lower limb, including hip 07/27/2012   Nonhealing lesion of the left mid calf medially   . Tachycardia 12/06/2014  . Tension headache   . Tension headache   . Type II or unspecified type diabetes mellitus without mention of complication, not stated as uncontrolled   . Unspecified venous (peripheral) insufficiency   . Urine, incontinence, stress female  07/19/2013   Past Surgical History:  Procedure Laterality Date  . EYE SURGERY Bilateral 1990   Dr. Katy Fitch  . GASTROJEJUNOSTOMY  05/2004   secondary to ulcer  . JOINT REPLACEMENT Bilateral 1993-1998   total knee replacement  . MOLE REMOVAL  05/11/14   left hand and left side of face [facility]  . OTHER SURGICAL HISTORY  2008   Ulcer surgery   . SMALL INTESTINE SURGERY    . TOTAL KNEE ARTHROPLASTY Bilateral 1993, 1998   knees    Allergies  Allergen Reactions  . Adhesive [Tape]     unknown  . Aspirin     unknown  . Augmentin [Amoxicillin-Pot Clavulanate]   . Codeine     unknown  . Diclofenac   . Enalapril Cough  . Hydrocodone     unknown  . Pantoprazole Sodium     unknown  . Voltaren [Diclofenac Sodium]       Medication List       Accurate as of 11/29/15  4:56 PM. Always use your most recent med list.          acetaminophen 500 MG tablet Commonly known as:  TYLENOL Take 500 mg by mouth. Take 2 at bedtime   acetaminophen 325 MG tablet Commonly known as:  TYLENOL Take 650 mg by mouth every 6 (six) hours as needed for moderate pain or headache.   atorvastatin 10 MG tablet Commonly known as:  LIPITOR Take 10 mg by mouth daily.   cloNIDine 0.1 MG tablet Commonly known as:  CATAPRES Take 0.1 mg by mouth. Take one tablet as needed for bp > 160/100   doxazosin 8 MG tablet Commonly known as:  CARDURA Take 8 mg by mouth daily. To control BP.   furosemide 40 MG tablet Commonly known as:  LASIX Take 40 mg by mouth daily.   HUMALOG 100 UNIT/ML cartridge Generic drug:  insulin lispro Inject 8 Units into the skin 3 (three) times daily with meals. For CBG >150   insulin glargine 100 UNIT/ML injection Commonly known as:  LANTUS Inject 15 Units into the skin at bedtime.   ipratropium-albuterol 0.5-2.5 (3) MG/3ML Soln Commonly known as:  DUONEB Take 3 mLs by nebulization every 6 (six) hours as needed.   losartan 50 MG tablet Commonly known as:   COZAAR Take 50 mg by mouth daily.   metoprolol tartrate 25 MG tablet Commonly known as:  LOPRESSOR Take 25 mg by mouth 2 (two) times daily.   multivitamin with minerals tablet Take 1 tablet by mouth daily.   omeprazole 20 MG capsule Commonly known as:  PRILOSEC Take 20 mg by mouth daily.   predniSONE 5 MG tablet Commonly known as:  DELTASONE Take 5 mg by mouth daily.   spironolactone 25 MG tablet Commonly known as:  ALDACTONE Take 25 mg by mouth daily.   zolpidem 5 MG tablet  Commonly known as:  AMBIEN Take 5 mg by mouth at bedtime as needed.       Review of Systems  Constitutional: Negative for chills, diaphoresis and fever.       Baseline mobility is motorized scooter  HENT: Positive for hearing loss and tinnitus. Negative for congestion, ear discharge and ear pain.   Eyes: Negative for photophobia and pain.  Respiratory: Positive for cough, shortness of breath and wheezing.        Lungs generally sound terrible. She has chronic bronchiectasis which leads to a persistent bronchial rattle, wheeze, and cough. Improved cough and SOB, mild wheezes on exertion  Cardiovascular: Positive for leg swelling. Negative for chest pain and palpitations.       1+ edema BLE,  Right knee pain.   Gastrointestinal: Positive for constipation. Negative for abdominal pain, diarrhea, nausea and vomiting.  Endocrine:       Insulin using DM  Genitourinary: Positive for frequency. Negative for dysuria and urgency.       Recurrent urinary tract infections. Nocturia. Urinary frequency.  Musculoskeletal: Negative for back pain, myalgias and neck pain.       R knee pain  Skin: Positive for rash.       Itching around neck. Chronic venous dermatitis BLE  Allergic/Immunologic: Negative for environmental allergies.  Neurological: Negative for dizziness, tremors, seizures, weakness and headaches.       Left lateral occipital area hot flashes  Psychiatric/Behavioral: Positive for sleep disturbance  (Insomnia. She will fall asleep, but consistently wakes up at 1:30 AM and then finds it difficult to fall asleep for the rest of the night.). Negative for hallucinations and suicidal ideas. The patient is not nervous/anxious.     Immunization History  Administered Date(s) Administered  . Influenza Split 11/06/2011, 11/04/2012  . Influenza,inj,Quad PF,36+ Mos 11/17/2013  . Influenza-Unspecified 11/09/2014, 11/16/2015  . PPD Test 11/29/2014  . Pneumococcal Conjugate-13 08/11/2014  . Pneumococcal Polysaccharide-23 02/04/2003  . Tdap 02/27/2013  . Zoster 02/03/2006   Pertinent  Health Maintenance Due  Topic Date Due  . OPHTHALMOLOGY EXAM  10/31/1929  . DEXA SCAN  10/31/1984  . FOOT EXAM  11/25/2014  . HEMOGLOBIN A1C  02/27/2016  . INFLUENZA VACCINE  Completed  . PNA vac Low Risk Adult  Completed   Fall Risk  12/06/2014 07/11/2014 05/22/2014 11/07/2013 08/30/2013  Falls in the past year? Yes No Yes No No  Number falls in past yr: 1 - 1 - -  Injury with Fall? No - No - -  Risk for fall due to : History of fall(s);Impaired mobility - History of fall(s);Impaired balance/gait - Impaired balance/gait;Impaired mobility  Follow up - - Falls evaluation completed - -   Vitals:   11/29/15 1657  BP: 140/68  Pulse: 80  Resp: 18  Temp: 98.2 F (36.8 C)  SpO2: 98%  Weight: 193 lb (87.5 kg)  Height: 5\' 4"  (1.626 m)   Body mass index is 33.13 kg/m. Physical Exam  Constitutional: She is oriented to person, place, and time. She appears well-developed and well-nourished. No distress.  HENT:  Head: Normocephalic and atraumatic.  Right Ear: External ear normal.  Left Ear: External ear normal.  Nose: Nose normal.  Eyes: Conjunctivae and EOM are normal. Pupils are equal, round, and reactive to light.  Neck: Neck supple. No JVD present. No tracheal deviation present. No thyromegaly present.  Cardiovascular: Normal rate, regular rhythm, normal heart sounds and intact distal pulses.  Exam reveals no  gallop and no friction  rub.   No murmur heard. Pulmonary/Chest: No respiratory distress. She has wheezes. She has rales.  Moist rales posterior mid to lower lungs. Trace edema BLE. Mild exertional wheezes  Abdominal: Bowel sounds are normal. She exhibits no distension and no mass. There is no tenderness.  Musculoskeletal: She exhibits edema (2-3+ bipedal) and tenderness.  Scars from bilateral TKR. Right knee seems to be loose when stressing the joint. No effusion. Instable gait. Driving a power scooter 2+ edema BLE Chronic R knee pain  Lymphadenopathy:    She has no cervical adenopathy.  Neurological: She is alert and oriented to person, place, and time. She has normal reflexes. No cranial nerve deficit. Coordination normal.  Diminished vibratory and monofilament testing in both feet.  Skin: No rash (Reddened rash on both legs. Some itching and burning associated with the rash.) noted. No erythema. No pallor.  Prior exam: 6 mm nodule of the left posterior neck at the SCM muscle seems resolved Likely SCC left leg medial to shin.  Psychiatric: She has a normal mood and affect. Her behavior is normal. Judgment and thought content normal.    Labs reviewed:  Recent Labs  03/15/15 03/22/15 08/27/15  NA 129* 135* 137  K 4.7 4.9 4.5  BUN 22* 22* 30*  CREATININE 0.7 0.6 0.7    Recent Labs  02/13/15 03/15/15 08/27/15  AST 10* 20 14  ALT 8 12 11   ALKPHOS 68 85 105    Recent Labs  02/19/15 03/15/15 08/27/15  WBC 7.5 11.6 8.5  HGB 10.0* 10.7* 10.7*  HCT 32* 35* 33*  PLT 193 220 249   Lab Results  Component Value Date   TSH 3.08 08/27/2015   Lab Results  Component Value Date   HGBA1C 8.0 08/27/2015   Lab Results  Component Value Date   CHOL 109 07/19/2015   HDL 75 (A) 07/19/2015   LDLCALC 26 07/19/2015   TRIG 39 (A) 07/19/2015   Assessment/Plan 1. Type 2 diabetes with decreased circulation (HCC) - if the AM glucose is < 150, the do not give Humalog. If 150-250, give 8  units Humalog. If 251 or highr, give 12 units Humalog.  2. Simple chronic bronchitis (HCC) -acute exacerbation that was treated with increased prednisone. She will soon return to usual 5 mg dose. The glucose is likely to improve at that time.  3. Essential hypertension controlled

## 2015-12-03 DIAGNOSIS — J189 Pneumonia, unspecified organism: Secondary | ICD-10-CM | POA: Diagnosis not present

## 2015-12-03 DIAGNOSIS — R609 Edema, unspecified: Secondary | ICD-10-CM | POA: Diagnosis not present

## 2015-12-03 DIAGNOSIS — I5042 Chronic combined systolic (congestive) and diastolic (congestive) heart failure: Secondary | ICD-10-CM | POA: Diagnosis not present

## 2015-12-03 DIAGNOSIS — E871 Hypo-osmolality and hyponatremia: Secondary | ICD-10-CM | POA: Diagnosis not present

## 2015-12-03 LAB — BASIC METABOLIC PANEL
BUN: 40 mg/dL — AB (ref 4–21)
Creatinine: 0.9 mg/dL (ref <=1.1)
GLUCOSE: 155 mg/dL
Potassium: 4.4 mmol/L (ref 3.4–5.3)
Sodium: 136 mmol/L — AB (ref 137–147)

## 2015-12-04 ENCOUNTER — Other Ambulatory Visit: Payer: Self-pay | Admitting: *Deleted

## 2015-12-17 ENCOUNTER — Non-Acute Institutional Stay (SKILLED_NURSING_FACILITY): Payer: Medicare Other | Admitting: Internal Medicine

## 2015-12-17 ENCOUNTER — Encounter: Payer: Self-pay | Admitting: Internal Medicine

## 2015-12-17 DIAGNOSIS — J479 Bronchiectasis, uncomplicated: Secondary | ICD-10-CM

## 2015-12-17 DIAGNOSIS — I5042 Chronic combined systolic (congestive) and diastolic (congestive) heart failure: Secondary | ICD-10-CM | POA: Diagnosis not present

## 2015-12-17 DIAGNOSIS — E871 Hypo-osmolality and hyponatremia: Secondary | ICD-10-CM

## 2015-12-17 DIAGNOSIS — I10 Essential (primary) hypertension: Secondary | ICD-10-CM

## 2015-12-17 DIAGNOSIS — D638 Anemia in other chronic diseases classified elsewhere: Secondary | ICD-10-CM

## 2015-12-17 DIAGNOSIS — E1151 Type 2 diabetes mellitus with diabetic peripheral angiopathy without gangrene: Secondary | ICD-10-CM | POA: Diagnosis not present

## 2015-12-17 NOTE — Progress Notes (Signed)
Progress Note    Location:  [place] Room Number: [employer] of Service:  SNF [phone]) Provider:  Jeanmarie Hubert, MD  Patient Care Team: Estill Dooms, MD as PCP - General (Internal Medicine) Clent Jacks, MD (Ophthalmology) Adrian Prows, MD as Attending Physician (Cardiology) [facility] [name], MD as Consulting Physician (Orthopedic Surgery) Kathee Delton, MD as Consulting Physician (Pulmonary Disease) Man Otho Darner, NP as Nurse Practitioner (Internal Medicine)  Extended Emergency Contact Information Primary Emergency Contact: Mccadden,[name] Address: [address]          [place], [place] [address] [place] Phone: [phone] Mobile Phone: [phone] Relation: Son Secondary Emergency Contact: Jhaveri,[name]  [place] Mobile Phone: [phone] Relation: Daughter  Code Status:  DNR Goals of care: Advanced Directive information Advanced Directives 12/17/2015  Does patient have an advance directive? Yes  Type of Advance Directive Living will;Healthcare Power of Attorney  Does patient want to make changes to advanced directive? -  Copy of advanced directive(s) in chart? Yes  Pre-existing out of facility DNR order (yellow form or pink MOST form) Yellow form placed in chart (order not valid for inpatient use)     Chief Complaint  Patient presents with  . Medical Management of Chronic Issues    routine visit    HPI:  Pt is a 82 y.o. female seen today for medical management of chronic diseases.    Recent possible pneumonia. Had increase in prednisone and subsequent rie in glucose. Has started her usual maintenance dose and is having lower glucose values.   Breathing has improved  Anemia of chronic disease - stable  Chronic combined systolic and diastolic congestive heart failure (HCC) - compensated  Hyponatremia - improved  Essential hypertension - controlled    Past Medical History:  Diagnosis  Date  . Abnormality of gait   . Anemia, unspecified   . Arthritis   . Bronchiectasis without acute exacerbation ([place]) 04/10/2011   CT chest 2011   . Cataract 1990   Dr. Katy Fitch  . Cholelithiases   . Closed fracture of unspecified part of ramus of mandible   . COPD (chronic obstructive pulmonary disease) ([place])   . Coronary atherosclerosis of unspecified type of vessel, native or graft   . Disturbance of skin sensation   . DOE (dyspnea on exertion) 04/10/2011   PFTs 2013:  FEV1 1.04 (78%), ratio 64, couldn't do maneuver for lung volumes or dlco   . Edema   . Esophageal reflux   . Fall from other slipping, tripping, or stumbling   . Gastric ulcer with hemorrhage 2008  . Hypertension   . Hypoxemia   . Insomnia, unspecified   . Mucopurulent chronic bronchitis ([place])   . Nodule of neck 11/07/2013   Left neck posteriorly   . Nontoxic uninodular goiter   . Osteoarthrosis, unspecified whether generalized or localized, unspecified site   . Other and unspecified hyperlipidemia   . Other malaise and fatigue   . Pain in joint, lower leg 2010   right knee  . Pain in joint, shoulder region 2013   left  . Palpitations   . Pneumonia, organism unspecified(486)   . Regional enteritis of unspecified site   . Rosacea   . Sciatica   . Squamous cell carcinoma of skin of lower limb, including hip 07/27/2012   Nonhealing lesion of the left mid calf medially   . Tachycardia 12/06/2014  . Tension headache   .  Tension headache   . Type II or unspecified type diabetes mellitus without mention of complication, not stated as uncontrolled   . Unspecified venous (peripheral) insufficiency   . Urine, incontinence, stress female 07/19/2013   Past Surgical History:  Procedure Laterality Date  . EYE SURGERY Bilateral 1990   Dr. Katy Fitch  . GASTROJEJUNOSTOMY  05/2004   secondary to ulcer  . JOINT REPLACEMENT Bilateral 1993-1998   total knee replacement  . MOLE REMOVAL  05/11/14   left hand and left side [facility] face  [facility]  . OTHER SURGICAL HISTORY  2008   Ulcer surgery   . SMALL INTESTINE SURGERY    . TOTAL KNEE ARTHROPLASTY Bilateral 1993, 1998   knees    Allergies  Allergen Reactions  . Adhesive [Tape]     unknown  . Aspirin     unknown  . Augmentin [Amoxicillin-Pot Clavulanate]   . Codeine     unknown  . Diclofenac   . Enalapril Cough  . Hydrocodone     unknown  . Pantoprazole Sodium     unknown  . Voltaren [Diclofenac Sodium]       Medication List       Accurate as of 12/17/15  2:26 PM. Always use your most recent med list.          acetaminophen 500 MG tablet Commonly known as:  TYLENOL Take 500 mg by mouth. Take one once daily in morning, take 2 at bedtime   acetaminophen 325 MG tablet Commonly known as:  TYLENOL Take 650 mg by mouth every 6 (six) hours as needed for moderate pain or headache.   atorvastatin 10 MG tablet Commonly known as:  LIPITOR Take 10 mg by mouth daily.   cloNIDine 0.1 MG tablet Commonly known as:  CATAPRES Take 0.1 mg by mouth. Take one tablet as needed for bp > 160/100   doxazosin 8 MG tablet Commonly known as:  CARDURA Take 8 mg by mouth daily. To control BP.   furosemide 40 MG tablet Commonly known as:  LASIX Take 40 mg by mouth daily.   HUMALOG 100 UNIT/ML cartridge Generic drug:  insulin lispro Inject 8 Units into the skin. Take twice daily For CBG >150 tive 8 units BS > 250, give 12 units once a day   insulin glargine 100 UNIT/ML injection Commonly known as:  LANTUS Inject 15 Units into the skin at bedtime.   ipratropium-albuterol 0.5-2.5 (3) MG/3ML Soln Commonly known as:  DUONEB Take 3 mLs by nebulization every 6 (six) hours as needed.   losartan 50 MG tablet Commonly known as:  COZAAR Take 50 mg by mouth daily.   metoprolol tartrate 25 MG tablet Commonly known as:  LOPRESSOR Take 25 mg by mouth 2 (two) times daily.   multivitamin with minerals tablet Take 1 tablet by mouth daily.   omeprazole 20  MG capsule Commonly known as:  PRILOSEC Take 20 mg by mouth daily.   predniSONE 5 MG tablet Commonly known as:  DELTASONE Take 5 mg by mouth daily.   ROBITUSSIN DM PO Take by mouth. 10 cc every 6 hours as needed for cough for 48 hours   spironolactone 25 MG tablet Commonly known as:  ALDACTONE Take 25 mg by mouth daily.   zolpidem 5 MG tablet Commonly known as:  AMBIEN Take 5 mg by mouth at bedtime as needed.       Review of Systems  Constitutional: Negative for chills, diaphoresis and fever.  Baseline mobility is motorized scooter  HENT: Positive for hearing loss and tinnitus. Negative for congestion, ear discharge and ear pain.   Eyes: Negative for photophobia and pain.  Respiratory: Positive for cough, shortness of breath and wheezing.        Lungs generally sound terrible. She has chronic bronchiectasis which leads to a persistent bronchial rattle, wheeze, and cough. Improved cough and SOB, mild wheezes on exertion  Cardiovascular: Positive for leg swelling. Negative for chest pain and palpitations.       1+ edema BLE,  Right knee pain.   Gastrointestinal: Positive for constipation. Negative for abdominal pain, diarrhea, nausea and vomiting.  Endocrine:       Insulin using DM  Genitourinary: Positive for frequency. Negative for dysuria and urgency.       Recurrent urinary tract infections. Nocturia. Urinary frequency.  Musculoskeletal: Negative for back pain, myalgias and neck pain.       R knee pain  Skin: Positive for rash.       Itching around neck. Chronic venous dermatitis BLE  Allergic/Immunologic: Negative for environmental allergies.  Neurological: Negative for dizziness, tremors, seizures, weakness and headaches.       Left lateral occipital area hot flashes  Psychiatric/Behavioral: Positive for sleep disturbance (Insomnia. She will fall asleep, but consistently wakes up at 1:30 AM and then finds it difficult to fall asleep for the rest of the night.).  Negative for hallucinations and suicidal ideas. The patient is not nervous/anxious.     Immunization History  Administered Date(s) Administered  . Influenza Split 11/06/2011, 11/04/2012  . Influenza,inj,Quad PF,36+ Mos 11/17/2013  . Influenza-Unspecified 11/09/2014, 11/16/2015  . PPD Test 11/29/2014  . Pneumococcal Conjugate-13 08/11/2014  . Pneumococcal Polysaccharide-23 02/04/2003  . Tdap 02/27/2013  . Zoster 02/03/2006   Pertinent  Health Maintenance Due  Topic Date Due  . OPHTHALMOLOGY EXAM  10[birth year]31/1929  . DEXA SCAN  10/31/1984  . FOOT EXAM  11/25/2014  . HEMOGLOBIN A1C  02/27/2016  . INFLUENZA VACCINE  Completed  . PNA vac Low Risk Adult  Completed   Fall Risk  12/06/2014 07/11/2014 05/22/2014 11/07/2013 08/30/2013  Falls in the past year? Yes No Yes No No  Number falls in past yr: 1 - 1 - -  Injury with Fall? No - No - -  Risk for fall due to : History of fall(s);Impaired mobility - History of fall(s);Impaired balance/gait - Impaired balance/gait;Impaired mobility  Follow up - - Falls evaluation completed - -    There were no vitals filed for this visit. There is no height or weight on file to calculate BMI. Physical Exam  Constitutional: She is oriented to person, place, and time. She appears well-developed and well-nourished. No distress.  HENT:  Head: Normocephalic and atraumatic.  Right Ear: External ear normal.  Left Ear: External ear normal.  Nose: Nose normal.  Eyes: Conjunctivae and EOM are normal. Pupils are equal, round, and reactive to light.  Neck: Neck supple. No JVD present. No tracheal deviation present. No thyromegaly present.  Cardiovascular: Normal rate, regular rhythm, normal heart sounds and intact distal pulses.  Exam reveals no gallop and no friction rub.   No murmur heard. Pulmonary/Chest: No respiratory distress. She has wheezes. She has rales.  Moist rales posterior mid to lower lungs. Trace edema BLE. Mild exertional wheezes  Abdominal: Bowel  sounds are normal. She exhibits no distension and no mass. There is no tenderness.  Musculoskeletal: She exhibits edema (2-3+ bipedal) and tenderness.  Scars from bilateral  TKR. Right knee seems to be loose when stressing the joint. No effusion. Instable gait. Driving a power scooter 2+ edema BLE Chronic R knee pain  Lymphadenopathy:    She has no cervical adenopathy.  Neurological: She is alert and oriented to person, place, and time. She has normal reflexes. No cranial nerve deficit. Coordination normal.  Diminished vibratory and monofilament testing in both feet.  Skin: No rash (Reddened rash on both legs. Some itching and burning associated with the rash.) noted. No erythema. No pallor.  Prior exam: 6 mm nodule of the left posterior neck at the SCM muscle seems resolved Likely SCC left leg medial to shin.  Psychiatric: She has a normal mood and affect. Her behavior is normal. Judgment and thought content normal.    Labs reviewed:  Recent Labs  08/27/15 11/26/15 12/03/15  NA 137 133* 136*  K 4.5 5.5* 4.4  BUN 30* 35* 40*  CREATININE 0.7 0.9 0.9    Recent Labs  03/15/15 08/27/15 11/26/15  AST 20 14 18   ALT 12 11 15   ALKPHOS 85 105 94    Recent Labs  03/15/15 08/27/15 11/26/15  WBC 11.6 8.5 7.7  HGB 10.7* 10.7* 10.2*  HCT 35* 33* 31*  PLT 220 249 221   Lab Results  Component Value Date   TSH 3.08 08/27/2015   Lab Results  Component Value Date   HGBA1C 8.0 08/27/2015   Lab Results  Component Value Date   CHOL 109 07/19/2015   HDL 75 (A) 07/19/2015   LDLCALC 26 07/19/2015   TRIG 39 (A) 07/19/2015     Assessment/Plan 1. Anemia of chronic disease -CBC  2. Chronic combined systolic and diastolic congestive heart failure (HCC) improved  3. Hyponatremia stable  4. Essential hypertension controlled  5. Bronchiectasis without complication ([place]) improved  6. 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

## 2015-12-20 DIAGNOSIS — E119 Type 2 diabetes mellitus without complications: Secondary | ICD-10-CM | POA: Diagnosis not present

## 2015-12-20 DIAGNOSIS — I10 Essential (primary) hypertension: Secondary | ICD-10-CM | POA: Diagnosis not present

## 2015-12-20 LAB — BASIC METABOLIC PANEL
BUN: 27 mg/dL — AB (ref 4–21)
CREATININE: 0.7 mg/dL (ref <=1.1)
GLUCOSE: 138 mg/dL
Potassium: 4.5 mmol/L (ref 3.4–5.3)
Sodium: 135 mmol/L — AB (ref 137–147)

## 2015-12-20 LAB — HEMOGLOBIN A1C: Hemoglobin A1C: 7.2

## 2015-12-21 ENCOUNTER — Other Ambulatory Visit: Payer: Self-pay | Admitting: *Deleted

## 2016-01-08 ENCOUNTER — Non-Acute Institutional Stay (SKILLED_NURSING_FACILITY): Payer: Medicare Other | Admitting: Nurse Practitioner

## 2016-01-08 ENCOUNTER — Encounter: Payer: Self-pay | Admitting: Nurse Practitioner

## 2016-01-08 DIAGNOSIS — G4700 Insomnia, unspecified: Secondary | ICD-10-CM | POA: Diagnosis not present

## 2016-01-08 DIAGNOSIS — K219 Gastro-esophageal reflux disease without esophagitis: Secondary | ICD-10-CM | POA: Diagnosis not present

## 2016-01-08 DIAGNOSIS — J410 Simple chronic bronchitis: Secondary | ICD-10-CM | POA: Diagnosis not present

## 2016-01-08 DIAGNOSIS — I5042 Chronic combined systolic (congestive) and diastolic (congestive) heart failure: Secondary | ICD-10-CM

## 2016-01-08 DIAGNOSIS — K5900 Constipation, unspecified: Secondary | ICD-10-CM

## 2016-01-08 DIAGNOSIS — I10 Essential (primary) hypertension: Secondary | ICD-10-CM

## 2016-01-08 DIAGNOSIS — E1151 Type 2 diabetes mellitus with diabetic peripheral angiopathy without gangrene: Secondary | ICD-10-CM

## 2016-01-08 DIAGNOSIS — M1990 Unspecified osteoarthritis, unspecified site: Secondary | ICD-10-CM | POA: Diagnosis not present

## 2016-01-08 DIAGNOSIS — R609 Edema, unspecified: Secondary | ICD-10-CM

## 2016-01-08 NOTE — Assessment & Plan Note (Signed)
Stable, continue Omeprazole 20mg daily.  

## 2016-01-08 NOTE — Assessment & Plan Note (Signed)
Chronic trace edema BLE, Continue Furosemide and Spironolactone.

## 2016-01-08 NOTE — Assessment & Plan Note (Signed)
Stable, continue Ambien prn  

## 2016-01-08 NOTE — Assessment & Plan Note (Signed)
Multiple joints, Tylenol helps.

## 2016-01-08 NOTE — Assessment & Plan Note (Signed)
Continue Lantus 15u qd, increase Humalog 8u with meals, total 8u if CBG>150, 08/27/15 /17 Hgb A1c 8.0

## 2016-01-08 NOTE — Assessment & Plan Note (Addendum)
Worsened congestion, coughing, sneezing, will start Mucinex 600mg  bid and increase Prednisone 10mg  x 10 days. Continue Neb Observe the patient.

## 2016-01-08 NOTE — Assessment & Plan Note (Signed)
BLE, chronic, continue Furosemide 40mg  daily, Spironolactone 25mg , monitor for s/s of decompensation of CHF.

## 2016-01-08 NOTE — Assessment & Plan Note (Signed)
Stable, off prn MiraLax

## 2016-01-08 NOTE — Progress Notes (Signed)
Location:  [facility] Room Number: 16 Place of Service:  SNF (31) Provider:  Narelle Schoening, [place]  NP  Jeanmarie Hubert, MD  Patient Care Team: Estill Dooms, MD as PCP - General (Internal Medicine) Clent Jacks, MD (Ophthalmology) Adrian Prows, MD as Attending Physician (Cardiology) [facility] [name], MD as Consulting Physician (Orthopedic Surgery) Kathee Delton, MD as Consulting Physician (Pulmonary Disease) Yesennia Hirota Otho Darner, NP as Nurse Practitioner (Internal Medicine)  Extended Emergency Contact Information Primary Emergency Contact: Donlon,[name] Address: [address]          [place], [place] [address] [place] Phone: [phone] Mobile Phone: [phone] Relation: Son Secondary Emergency Contact: Mittleman,[name]  [place] Mobile Phone: [phone] Relation: Daughter  Code Status:  DNR Goals of care: Advanced Directive information Advanced Directives 01/08/2016  Does Patient Have a Medical Advance Directive? Yes  Type of Advance Directive Living will;Healthcare Power of Attorney  Does patient want to make changes to medical advance directive? No - Patient declined  Copy of [place] in Chart? Yes  Pre-existing out of facility DNR order (yellow form or pink MOST form) -     Chief Complaint  Patient presents with  . Acute Visit    very congested, evaluated lungs    HPI:  Pt is a 82 y.o. female seen today for an acute visit for worsened sneezing, cough, congestion, clear sputum, she is afebrile, denied chest pain or palpitation.     Hx of BLE chronic venous dermatitis, takes Furosemide 40mg , Spironolactone 25mg . COPD chronic SOB at her baseline, taking Prednisone 5mg ,  Hgb a1c 8 recently, taking Insulin to manage blood sugar.   Past Medical History:  Diagnosis Date  . Abnormality of gait   . Anemia, unspecified   . Arthritis   . Bronchiectasis without acute exacerbation ([place]) 04/10/2011   CT  chest 2011   . Cataract 1990   Dr. Katy Fitch  . Cholelithiases   . Closed fracture of unspecified part of ramus of mandible   . COPD (chronic obstructive pulmonary disease) ([place])   . Coronary atherosclerosis of unspecified type of vessel, native or graft   . Disturbance of skin sensation   . DOE (dyspnea on exertion) 04/10/2011   PFTs 2013:  FEV1 1.04 (78%), ratio 64, couldn't do maneuver for lung volumes or dlco   . Edema   . Esophageal reflux   . Fall from other slipping, tripping, or stumbling   . Gastric ulcer with hemorrhage 2008  . Hypertension   . Hypoxemia   . Insomnia, unspecified   . Mucopurulent chronic bronchitis ([place])   . Nodule of neck 11/07/2013   Left neck posteriorly   . Nontoxic uninodular goiter   . Osteoarthrosis, unspecified whether generalized or localized, unspecified site   . Other and unspecified hyperlipidemia   . Other malaise and fatigue   . Pain in joint, lower leg 2010   right knee  . Pain in joint, shoulder region 2013   left  . Palpitations   . Pneumonia, organism unspecified(486)   . Regional enteritis of unspecified site   . Rosacea   . Sciatica   . Squamous cell carcinoma of skin of lower limb, including hip 07/27/2012   Nonhealing lesion of the left mid calf medially   . Tachycardia 12/06/2014  . Tension headache   . Tension headache   . Type II or unspecified type diabetes mellitus without mention of complication, not  stated as uncontrolled   . Unspecified venous (peripheral) insufficiency   . Urine, incontinence, stress female 07/19/2013   Past Surgical History:  Procedure Laterality Date  . EYE SURGERY Bilateral 1990   Dr. Katy Fitch  . GASTROJEJUNOSTOMY  05/2004   secondary to ulcer  . JOINT REPLACEMENT Bilateral 1993-1998   total knee replacement  . MOLE REMOVAL  05/11/14   left hand and left side of face [facility]  . OTHER SURGICAL HISTORY  2008   Ulcer surgery   . SMALL INTESTINE SURGERY    . TOTAL KNEE ARTHROPLASTY Bilateral  1993, 1998   knees    Allergies  Allergen Reactions  . Adhesive [Tape]     unknown  . Aspirin     unknown  . Augmentin [Amoxicillin-Pot Clavulanate]   . Codeine     unknown  . Diclofenac   . Enalapril Cough  . Hydrocodone     unknown  . Pantoprazole Sodium     unknown  . Voltaren [Diclofenac Sodium]       Medication List       Accurate as of 01/08/16 11:59 PM. Always use your most recent med list.          acetaminophen 500 MG tablet Commonly known as:  TYLENOL Take 500 mg by mouth. Take one once daily in morning, take 2 at bedtime   acetaminophen 325 MG tablet Commonly known as:  TYLENOL Take 650 mg by mouth every 6 (six) hours as needed for moderate pain or headache.   atorvastatin 10 MG tablet Commonly known as:  LIPITOR Take 10 mg by mouth daily.   cloNIDine 0.1 MG tablet Commonly known as:  CATAPRES Take 0.1 mg by mouth. Take one tablet as needed for bp > 160/100   doxazosin 8 MG tablet Commonly known as:  CARDURA Take 8 mg by mouth daily. To control BP.   furosemide 40 MG tablet Commonly known as:  LASIX Take 40 mg by mouth daily.   HUMALOG 100 UNIT/ML cartridge Generic drug:  insulin lispro Inject 8 Units into the skin. Take twice daily For CBG >150 tive 8 units BS > 250, give 12 units once a day   insulin glargine 100 UNIT/ML injection Commonly known as:  LANTUS Inject 15 Units into the skin at bedtime.   ipratropium-albuterol 0.5-2.5 (3) MG/3ML Soln Commonly known as:  DUONEB Take 3 mLs by nebulization every 6 (six) hours as needed.   losartan 50 MG tablet Commonly known as:  COZAAR Take 50 mg by mouth daily.   metoprolol tartrate 25 MG tablet Commonly known as:  LOPRESSOR Take 25 mg by mouth 2 (two) times daily.   multivitamin with minerals tablet Take 1 tablet by mouth daily.   omeprazole 20 MG capsule Commonly known as:  PRILOSEC Take 20 mg by mouth daily.   predniSONE 5 MG tablet Commonly known as:  DELTASONE Take 5 mg by  mouth daily.   ROBITUSSIN DM PO Take by mouth. 10 cc every 6 hours as needed for cough for 48 hours   spironolactone 25 MG tablet Commonly known as:  ALDACTONE Take 25 mg by mouth daily.   zolpidem 5 MG tablet Commonly known as:  AMBIEN Take 5 mg by mouth at bedtime as needed.       Review of Systems  Constitutional: Negative for chills, diaphoresis and fever.       Baseline mobility is motorized scooter  HENT: Positive for hearing loss and tinnitus. Negative for congestion,  ear discharge and ear pain.   Eyes: Negative for photophobia and pain.  Respiratory: Positive for cough, shortness of breath and wheezing.        Lungs generally sound terrible. She has chronic bronchiectasis which leads to a persistent bronchial rattle, wheeze, and cough. Improved cough and SOB, mild wheezes on exertion  Cardiovascular: Positive for leg swelling. Negative for chest pain and palpitations.       1+ edema BLE,  Right knee pain.   Gastrointestinal: Positive for constipation. Negative for abdominal pain, diarrhea, nausea and vomiting.  Endocrine:       Insulin using DM  Genitourinary: Positive for frequency. Negative for dysuria and urgency.       Recurrent urinary tract infections. Nocturia. Urinary frequency.  Musculoskeletal: Negative for back pain, myalgias and neck pain.       R knee pain  Skin: Positive for rash.       Itching around neck. Chronic venous dermatitis BLE  Allergic/Immunologic: Negative for environmental allergies.  Neurological: Negative for dizziness, tremors, seizures, weakness and headaches.       Left lateral occipital area hot flashes  Psychiatric/Behavioral: Positive for sleep disturbance (Insomnia. She will fall asleep, but consistently wakes up at 1:30 AM and then finds it difficult to fall asleep for the rest of the night.). Negative for hallucinations and suicidal ideas. The patient is not nervous/anxious.     Immunization History  Administered Date(s)  Administered  . Influenza Split 11/06/2011, 11/04/2012  . Influenza,inj,Quad PF,36+ Mos 11/17/2013  . Influenza-Unspecified 11/09/2014, 11/16/2015  . PPD Test 11/29/2014  . Pneumococcal Conjugate-13 08/11/2014  . Pneumococcal Polysaccharide-23 02/04/2003  . Tdap 02/27/2013  . Zoster 02/03/2006   Pertinent  Health Maintenance Due  Topic Date Due  . OPHTHALMOLOGY EXAM  10/[birth year]/1929  . DEXA SCAN  10/31/1984  . FOOT EXAM  11/25/2014  . HEMOGLOBIN A1C  06/18/2016  . INFLUENZA VACCINE  Completed  . PNA vac Low Risk Adult  Completed   Fall Risk  12/06/2014 07/11/2014 05/22/2014 11/07/2013 08/30/2013  Falls in the past year? Yes No Yes No No  Number falls in past yr: 1 - 1 - -  Injury with Fall? No - No - -  Risk for fall due to : History of fall(s);Impaired mobility - History of fall(s);Impaired balance/gait - Impaired balance/gait;Impaired mobility  Follow up - - Falls evaluation completed - -   Functional Status Survey:    Vitals:   01/08/16 1621  BP: 116/60  Pulse: 80  Resp: 20  Temp: 97.7 F (36.5 C)  SpO2: 93%  Weight: 202 lb (91.6 kg)  Height: 5\' 4"  (1.626 m)   Body mass index is 34.67 kg/m. Physical Exam  Constitutional: She is oriented to person, place, and time. She appears well-developed and well-nourished. No distress.  HENT:  Head: Normocephalic and atraumatic.  Right Ear: External ear normal.  Left Ear: External ear normal.  Nose: Nose normal.  Eyes: Conjunctivae and EOM are normal. Pupils are equal, round, and reactive to light.  Neck: Neck supple. No JVD present. No tracheal deviation present. No thyromegaly present.  Cardiovascular: Normal rate, regular rhythm, normal heart sounds and intact distal pulses.  Exam reveals no gallop and no friction rub.   No murmur heard. Pulmonary/Chest: No respiratory distress. She has wheezes. She has rales.  Moist rales posterior mid to lower lungs. Trace edema BLE. Mild exertional wheezes  Abdominal: Bowel sounds are normal.  She exhibits no distension and no mass. There is no tenderness.  Musculoskeletal: She exhibits edema (2-3+ bipedal) and tenderness.  Scars from bilateral TKR. Right knee seems to be loose when stressing the joint. No effusion. Instable gait. Driving a power scooter 2+ edema BLE Chronic R knee pain  Lymphadenopathy:    She has no cervical adenopathy.  Neurological: She is alert and oriented to person, place, and time. She has normal reflexes. No cranial nerve deficit. Coordination normal.  Diminished vibratory and monofilament testing in both feet.  Skin: No rash (Reddened rash on both legs. Some itching and burning associated with the rash.) noted. No erythema. No pallor.  Prior exam: 6 mm nodule of the left posterior neck at the SCM muscle seems resolved Likely SCC left leg medial to shin.  Psychiatric: She has a normal mood and affect. Her behavior is normal. Judgment and thought content normal.    Labs reviewed:  Recent Labs  11/26/15 12/03/15 12/20/15  NA 133* 136* 135*  K 5.5* 4.4 4.5  BUN 35* 40* 27*  CREATININE 0.9 0.9 0.7    Recent Labs  03/15/15 08/27/15 11/26/15  AST 20 14 18   ALT 12 11 15   ALKPHOS 85 105 94    Recent Labs  03/15/15 08/27/15 11/26/15  WBC 11.6 8.5 7.7  HGB 10.7* 10.7* 10.2*  HCT 35* 33* 31*  PLT 220 249 221   Lab Results  Component Value Date   TSH 3.08 08/27/2015   Lab Results  Component Value Date   HGBA1C 7.2 12/20/2015   Lab Results  Component Value Date   CHOL 109 07/19/2015   HDL 75 (A) 07/19/2015   LDLCALC 26 07/19/2015   TRIG 39 (A) 07/19/2015    Significant Diagnostic Results in last 30 days:  No results found.  Assessment/Plan Hypertension controlled, continue Cardura 8mg  daily, Metoprolol 25mg  bid, Losartan 50mg  and prn Clonidine 0.1mg  fro Bp>160/100 - On lasix 40mg , Spironolactone 25mg  daily, chronic BLE edema - Continue statin      Chronic combined systolic and diastolic congestive heart failure (HCC) BLE,  chronic, continue Furosemide 40mg  daily, Spironolactone 25mg , monitor for s/s of decompensation of CHF.    COPD (chronic obstructive pulmonary disease) (HCC) Worsened congestion, coughing, sneezing, will start Mucinex 600mg  bid and increase Prednisone 10mg  x 10 days. Continue Neb Observe the patient.   Esophageal reflux Stable, continue Omeprazole 20mg  daily     Constipation Stable, off prn MiraLax   Type 2 diabetes with decreased circulation (HCC) Continue Lantus 15u qd, increase Humalog 8u with meals, total 8u if CBG>150, 08/27/15 /17 Hgb A1c 8.0    Arthritis Multiple joints, Tylenol helps.   Edema Chronic trace edema BLE, Continue Furosemide and Spironolactone.    Insomnia Stable, continue Ambien prn      Family/ staff Communication: SNF  Labs/tests ordered:  none

## 2016-01-08 NOTE — Assessment & Plan Note (Signed)
controlled, continue Cardura 8mg  daily, Metoprolol 25mg  bid, Losartan 50mg  and prn Clonidine 0.1mg  fro Bp>160/100 - On lasix 40mg , Spironolactone 25mg  daily, chronic BLE edema - Continue statin

## 2016-02-05 ENCOUNTER — Non-Acute Institutional Stay (SKILLED_NURSING_FACILITY): Payer: Medicare Other | Admitting: Nurse Practitioner

## 2016-02-05 ENCOUNTER — Encounter: Payer: Self-pay | Admitting: Nurse Practitioner

## 2016-02-05 DIAGNOSIS — I5042 Chronic combined systolic (congestive) and diastolic (congestive) heart failure: Secondary | ICD-10-CM

## 2016-02-05 DIAGNOSIS — K5900 Constipation, unspecified: Secondary | ICD-10-CM | POA: Diagnosis not present

## 2016-02-05 DIAGNOSIS — R609 Edema, unspecified: Secondary | ICD-10-CM | POA: Diagnosis not present

## 2016-02-05 DIAGNOSIS — I10 Essential (primary) hypertension: Secondary | ICD-10-CM | POA: Diagnosis not present

## 2016-02-05 DIAGNOSIS — E1151 Type 2 diabetes mellitus with diabetic peripheral angiopathy without gangrene: Secondary | ICD-10-CM | POA: Diagnosis not present

## 2016-02-05 DIAGNOSIS — I872 Venous insufficiency (chronic) (peripheral): Secondary | ICD-10-CM

## 2016-02-05 DIAGNOSIS — D638 Anemia in other chronic diseases classified elsewhere: Secondary | ICD-10-CM

## 2016-02-05 DIAGNOSIS — G4700 Insomnia, unspecified: Secondary | ICD-10-CM

## 2016-02-05 DIAGNOSIS — J410 Simple chronic bronchitis: Secondary | ICD-10-CM

## 2016-02-05 DIAGNOSIS — K219 Gastro-esophageal reflux disease without esophagitis: Secondary | ICD-10-CM

## 2016-02-05 NOTE — Assessment & Plan Note (Signed)
Stable, continue Ambien prn  

## 2016-02-05 NOTE — Assessment & Plan Note (Signed)
Stable, continue Omeprazole 20mg daily.  

## 2016-02-05 NOTE — Assessment & Plan Note (Signed)
Stable, off prn MiraLax

## 2016-02-05 NOTE — Assessment & Plan Note (Signed)
BLE, chronic, continue Furosemide and Spironolactone,  monitor for s/s of decompensation of CHF.

## 2016-02-05 NOTE — Assessment & Plan Note (Signed)
Chronic edema BLE, Continue Furosemide and Spironolactone.  

## 2016-02-05 NOTE — Progress Notes (Signed)
Location:  [facility] Room Number: 16 Place of Service:  SNF (31) Provider:  Nataley Bahri, [place] NP  Jeanmarie Hubert, MD  Patient Care Team: Estill Dooms, MD as PCP - General (Internal Medicine) Clent Jacks, MD (Ophthalmology) Adrian Prows, MD as Attending Physician (Cardiology) [facility] [name], MD as Consulting Physician (Orthopedic Surgery) Kathee Delton, MD as Consulting Physician (Pulmonary Disease) Hamed Debella Otho Darner, NP as Nurse Practitioner (Internal Medicine)  Extended Emergency Contact Information Primary Emergency Contact: Arismendez,[name] Address: [address]          [place], [place] [address] [place] Phone: [phone] Mobile Phone: [phone] Relation: Son Secondary Emergency Contact: Samudio,[name]  [place] Mobile Phone: [phone] Relation: Daughter  Code Status:  DNR Goals of care: Advanced Directive information Advanced Directives 02/05/2016  Does Patient Have a Medical Advance Directive? Yes  Type of Advance Directive Living will;Healthcare Power of [place];Out of facility DNR (pink MOST or yellow form)  Does patient want to make changes to medical advance directive? No - Patient declined  Copy of [place] in Chart? Yes  Pre-existing out of facility DNR order (yellow form or pink MOST form) Yellow form placed in chart (order not valid for inpatient use)     Chief Complaint  Patient presents with  . Acute Visit    cough, wheezing, both legs swelling.    HPI:  Pt is a 83 y.o. female seen today for an acute visit for increased edema, persisted venous stasis dermatitis, no open wound presently.     Hx of BLE chronic venous dermatitis, takes Furosemide 40mg , Spironolactone 25mg . COPD chronic SOB at her baseline, taking Prednisone 5mg ,  Hgb a1c 8 recently, taking Insulin to manage blood sugar.  Past Medical History:  Diagnosis Date  . Abnormality of gait   . Anemia,  unspecified   . Arthritis   . Bronchiectasis without acute exacerbation ([place]) 04/10/2011   CT chest 2011   . Cataract 1990   Dr. Katy Fitch  . Cholelithiases   . Closed fracture of unspecified part of ramus of mandible   . COPD (chronic obstructive pulmonary disease) ([place])   . Coronary atherosclerosis of unspecified type of vessel, native or graft   . Disturbance of skin sensation   . DOE (dyspnea on exertion) 04/10/2011   PFTs 2013:  FEV1 1.04 (78%), ratio 64, couldn't do maneuver for lung volumes or dlco   . Edema   . Esophageal reflux   . Fall from other slipping, tripping, or stumbling   . Gastric ulcer with hemorrhage 2008  . Hypertension   . Hypoxemia   . Insomnia, unspecified   . Mucopurulent chronic bronchitis ([place])   . Nodule of neck 11/07/2013   Left neck posteriorly   . Nontoxic uninodular goiter   . Osteoarthrosis, unspecified whether generalized or localized, unspecified site   . Other and unspecified hyperlipidemia   . Other malaise and fatigue   . Pain in joint, lower leg 2010   right knee  . Pain in joint, shoulder region 2013   left  . Palpitations   . Pneumonia, organism unspecified(486)   . Regional enteritis of unspecified site   . Rosacea   . Sciatica   . Squamous cell carcinoma of skin of lower limb, including hip 07/27/2012   Nonhealing lesion of the left mid calf medially   . Tachycardia 12/06/2014  . Tension headache   . Tension headache   .  Type II or unspecified type diabetes mellitus without mention of complication, not stated as uncontrolled   . Unspecified venous (peripheral) insufficiency   . Urine, incontinence, stress female 07/19/2013   Past Surgical History:  Procedure Laterality Date  . EYE SURGERY Bilateral 1990   Dr. Katy Fitch  . GASTROJEJUNOSTOMY  05/2004   secondary to ulcer  . JOINT REPLACEMENT Bilateral 1993-1998   total knee replacement  . MOLE REMOVAL  05/11/14   left hand and left side of face [facility]  . OTHER SURGICAL  HISTORY  2008   Ulcer surgery   . SMALL INTESTINE SURGERY    . TOTAL KNEE ARTHROPLASTY Bilateral 1993, 1998   knees    Allergies  Allergen Reactions  . Adhesive [Tape]     unknown  . Aspirin     unknown  . Augmentin [Amoxicillin-Pot Clavulanate]   . Codeine     unknown  . Diclofenac   . Enalapril Cough  . Hydrocodone     unknown  . Pantoprazole Sodium     unknown  . Voltaren [Diclofenac Sodium]     Allergies as of 02/05/2016      Reactions   Adhesive [tape]    unknown   Aspirin    unknown   Augmentin [amoxicillin-pot Clavulanate]    Codeine    unknown   Diclofenac    Enalapril Cough   Hydrocodone    unknown   Pantoprazole Sodium    unknown   Voltaren [diclofenac Sodium]       Medication List       Accurate as of 02/05/16 11:33 AM. Always use your most recent med list.          acetaminophen 500 MG tablet Commonly known as:  TYLENOL Take 500 mg by mouth. Take one once daily in morning, take 2 at bedtime   acetaminophen 325 MG tablet Commonly known as:  TYLENOL Take 650 mg by mouth every 6 (six) hours as needed for moderate pain or headache.   atorvastatin 10 MG tablet Commonly known as:  LIPITOR Take 10 mg by mouth daily.   doxazosin 8 MG tablet Commonly known as:  CARDURA Take 8 mg by mouth daily. To control BP.   furosemide 40 MG tablet Commonly known as:  LASIX Take 40 mg by mouth daily.   HUMALOG 100 UNIT/ML cartridge Generic drug:  insulin lispro Inject 8 Units into the skin. Take twice daily For CBG >150 tive 8 units BS > 250, give 12 units once a day   insulin glargine 100 UNIT/ML injection Commonly known as:  LANTUS Inject 15 Units into the skin at bedtime.   ipratropium-albuterol 0.5-2.5 (3) MG/3ML Soln Commonly known as:  DUONEB Take 3 mLs by nebulization every 6 (six) hours as needed.   losartan 50 MG tablet Commonly known as:  COZAAR Take 50 mg by mouth daily.   metoprolol tartrate 25 MG tablet Commonly known as:   LOPRESSOR Take 25 mg by mouth 2 (two) times daily.   multivitamin with minerals tablet Take 1 tablet by mouth daily.   omeprazole 20 MG capsule Commonly known as:  PRILOSEC Take 20 mg by mouth daily.   predniSONE 5 MG tablet Commonly known as:  DELTASONE Take 5 mg by mouth daily.   ROBITUSSIN DM PO Take by mouth. 10 cc every 6 hours as needed for cough for 48 hours   spironolactone 25 MG tablet Commonly known as:  ALDACTONE Take 25 mg by mouth daily.  zolpidem 5 MG tablet Commonly known as:  AMBIEN Take 5 mg by mouth at bedtime as needed.       Review of Systems  Constitutional: Negative for chills, diaphoresis and fever.       Baseline mobility is motorized scooter  HENT: Positive for hearing loss and tinnitus. Negative for congestion, ear discharge and ear pain.   Eyes: Negative for photophobia and pain.  Respiratory: Positive for cough, shortness of breath and wheezing.        Lungs generally sound terrible. She has chronic bronchiectasis which leads to a persistent bronchial rattle, wheeze, and cough. Improved cough and SOB, mild wheezes on exertion  Cardiovascular: Positive for leg swelling. Negative for chest pain and palpitations.       1+ edema BLE,  Right knee pain.   Gastrointestinal: Positive for constipation. Negative for abdominal pain, diarrhea, nausea and vomiting.  Endocrine:       Insulin using DM  Genitourinary: Positive for frequency. Negative for dysuria and urgency.       Recurrent urinary tract infections. Nocturia. Urinary frequency.  Musculoskeletal: Negative for back pain, myalgias and neck pain.       R knee pain  Skin: Positive for rash.       Itching around neck. Chronic venous dermatitis BLE  Allergic/Immunologic: Negative for environmental allergies.  Neurological: Negative for dizziness, tremors, seizures, weakness and headaches.       Left lateral occipital area hot flashes  Psychiatric/Behavioral: Positive for sleep disturbance  (Insomnia. She will fall asleep, but consistently wakes up at 1:30 AM and then finds it difficult to fall asleep for the rest of the night.). Negative for hallucinations and suicidal ideas. The patient is not nervous/anxious.     Immunization History  Administered Date(s) Administered  . Influenza Split 11/06/2011, 11/04/2012  . Influenza,inj,Quad PF,36+ Mos 11/17/2013  . Influenza-Unspecified 11/09/2014, 11/16/2015  . PPD Test 11/29/2014  . Pneumococcal Conjugate-13 08/11/2014  . Pneumococcal Polysaccharide-23 02/04/2003  . Tdap 02/27/2013  . Zoster 02/03/2006   Pertinent  Health Maintenance Due  Topic Date Due  . OPHTHALMOLOGY EXAM  10/[birth year]/1929  . DEXA SCAN  10/31/1984  . FOOT EXAM  11/25/2014  . HEMOGLOBIN A1C  06/18/2016  . INFLUENZA VACCINE  Completed  . PNA vac Low Risk Adult  Completed   Fall Risk  12/06/2014 07/11/2014 05/22/2014 11/07/2013 08/30/2013  Falls in the past year? Yes No Yes No No  Number falls in past yr: 1 - 1 - -  Injury with Fall? No - No - -  Risk for fall due to : History of fall(s);Impaired mobility - History of fall(s);Impaired balance/gait - Impaired balance/gait;Impaired mobility  Follow up - - Falls evaluation completed - -   Functional Status Survey:    Vitals:   02/05/16 1042  BP: (!) 146/68  Pulse: 72  Resp: 20  Temp: 98.4 F (36.9 C)  SpO2: 95%  Weight: 202 lb (91.6 kg)  Height: 5\' 4"  (1.626 m)   Body mass index is 34.67 kg/m. Physical Exam  Constitutional: She is oriented to person, place, and time. She appears well-developed and well-nourished. No distress.  HENT:  Head: Normocephalic and atraumatic.  Right Ear: External ear normal.  Left Ear: External ear normal.  Nose: Nose normal.  Eyes: Conjunctivae and EOM are normal. Pupils are equal, round, and reactive to light.  Neck: Neck supple. No JVD present. No tracheal deviation present. No thyromegaly present.  Cardiovascular: Normal rate, regular rhythm, normal heart sounds and  intact distal pulses.  Exam reveals no gallop and no friction rub.   No murmur heard. Pulmonary/Chest: No respiratory distress. She has wheezes. She has rales.  Moist rales posterior mid to lower lungs. Trace edema BLE. Mild exertional wheezes  Abdominal: Bowel sounds are normal. She exhibits no distension and no mass. There is no tenderness.  Musculoskeletal: She exhibits edema (2-3+ bipedal) and tenderness.  Scars from bilateral TKR. Right knee seems to be loose when stressing the joint. No effusion. Instable gait. Driving a power scooter 2+ edema BLE Chronic R knee pain  Lymphadenopathy:    She has no cervical adenopathy.  Neurological: She is alert and oriented to person, place, and time. She has normal reflexes. No cranial nerve deficit. Coordination normal.  Diminished vibratory and monofilament testing in both feet.  Skin: No rash (Reddened rash on both legs. Some itching and burning associated with the rash.) noted. No erythema. No pallor.  Prior exam: 6 mm nodule of the left posterior neck at the SCM muscle seems resolved Likely SCC left leg medial to shin.  Psychiatric: She has a normal mood and affect. Her behavior is normal. Judgment and thought content normal.    Labs reviewed:  Recent Labs  11/26/15 12/03/15 12/20/15  NA 133* 136* 135*  K 5.5* 4.4 4.5  BUN 35* 40* 27*  CREATININE 0.9 0.9 0.7    Recent Labs  03/15/15 08/27/15 11/26/15  AST 20 14 18   ALT 12 11 15   ALKPHOS 85 105 94    Recent Labs  03/15/15 08/27/15 11/26/15  WBC 11.6 8.5 7.7  HGB 10.7* 10.7* 10.2*  HCT 35* 33* 31*  PLT 220 249 221   Lab Results  Component Value Date   TSH 3.08 08/27/2015   Lab Results  Component Value Date   HGBA1C 7.2 12/20/2015   Lab Results  Component Value Date   CHOL 109 07/19/2015   HDL 75 (A) 07/19/2015   LDLCALC 26 07/19/2015   TRIG 39 (A) 07/19/2015    Significant Diagnostic Results in last 30 days:  No results found.  Assessment/Plan No  problem-specific Assessment & Plan notes found for this encounter.     Family/ staff Communication: SNF  Labs/tests ordered:  BMP one week.

## 2016-02-05 NOTE — Assessment & Plan Note (Signed)
Continue Lantus 15u qd, increase Humalog 8u with meals, total 8u if CBG>150, 08/27/15 /17 Hgb A1c 8.0, 12/20/15 Hgb a1c 7.2

## 2016-02-05 NOTE — Assessment & Plan Note (Signed)
increased edema, persisted venous stasis dermatitis, no open wound presently.  12/20/15 Na 135, K 4.5, Bun 27, creat 0.7 Increase Furosemide 40mg  bid, continue Spironolactone 25mg  daily, update BMP one week

## 2016-02-05 NOTE — Assessment & Plan Note (Signed)
Chronic cough,  Continue Neb Observe the patient.

## 2016-02-05 NOTE — Assessment & Plan Note (Signed)
controlled, continue Cardura 8mg  daily, Metoprolol 25mg  bid, Losartan 50mg  and prn Clonidine 0.1mg  fro Bp>160/100 - Continue statin

## 2016-02-05 NOTE — Assessment & Plan Note (Signed)
08/27/15 Hgb 10.7, 11/26/15 Hgb 10.2

## 2016-02-08 DIAGNOSIS — E1159 Type 2 diabetes mellitus with other circulatory complications: Secondary | ICD-10-CM | POA: Diagnosis not present

## 2016-02-08 DIAGNOSIS — B351 Tinea unguium: Secondary | ICD-10-CM | POA: Diagnosis not present

## 2016-02-08 DIAGNOSIS — L84 Corns and callosities: Secondary | ICD-10-CM | POA: Diagnosis not present

## 2016-02-08 LAB — HM DIABETES FOOT EXAM

## 2016-02-11 DIAGNOSIS — I10 Essential (primary) hypertension: Secondary | ICD-10-CM | POA: Diagnosis not present

## 2016-02-11 LAB — BASIC METABOLIC PANEL
BUN: 35 mg/dL — AB (ref 4–21)
CREATININE: 0.8 mg/dL (ref <=1.1)
Glucose: 132 mg/dL
POTASSIUM: 4.2 mmol/L (ref 3.4–5.3)
Sodium: 139 mmol/L (ref 137–147)

## 2016-02-12 ENCOUNTER — Other Ambulatory Visit: Payer: Self-pay | Admitting: *Deleted

## 2016-02-28 ENCOUNTER — Non-Acute Institutional Stay (SKILLED_NURSING_FACILITY): Payer: Medicare Other | Admitting: Internal Medicine

## 2016-02-28 ENCOUNTER — Encounter: Payer: Self-pay | Admitting: Internal Medicine

## 2016-02-28 DIAGNOSIS — I5042 Chronic combined systolic (congestive) and diastolic (congestive) heart failure: Secondary | ICD-10-CM

## 2016-02-28 DIAGNOSIS — E871 Hypo-osmolality and hyponatremia: Secondary | ICD-10-CM | POA: Diagnosis not present

## 2016-02-28 DIAGNOSIS — R609 Edema, unspecified: Secondary | ICD-10-CM | POA: Diagnosis not present

## 2016-02-28 DIAGNOSIS — J479 Bronchiectasis, uncomplicated: Secondary | ICD-10-CM

## 2016-02-28 DIAGNOSIS — D638 Anemia in other chronic diseases classified elsewhere: Secondary | ICD-10-CM

## 2016-02-28 DIAGNOSIS — E1151 Type 2 diabetes mellitus with diabetic peripheral angiopathy without gangrene: Secondary | ICD-10-CM | POA: Diagnosis not present

## 2016-02-28 DIAGNOSIS — I10 Essential (primary) hypertension: Secondary | ICD-10-CM

## 2016-02-28 NOTE — Progress Notes (Signed)
Progress Note    Location:  [facility] Room Number: [place] of Service:  SNF [phone]) Provider:  Jeanmarie Hubert, MD  Patient Care Team: Estill Dooms, MD as PCP - General (Internal Medicine) Clent Jacks, MD (Ophthalmology) Adrian Prows, MD as Attending Physician (Cardiology) [facility] [name], MD as Consulting Physician (Orthopedic Surgery) Kathee Delton, MD as Consulting Physician (Pulmonary Disease) Man Otho Darner, NP as Nurse Practitioner (Internal Medicine)  Extended Emergency Contact Information Primary Emergency Contact: Cockerell,[name] Address: [address]          [place], [place] [address] [name] of [place] Phone: [phone] Mobile Phone: [phone] Relation: Son Secondary Emergency Contact: Rotunno,[name]  [place] Mobile Phone: [phone] Relation: Daughter  Code Status:  DNR Goals of care: Advanced Directive information Advanced Directives 02/28/2016  Does Patient Have a Medical Advance Directive? Yes  Type of Paramedic of [place];Living will;Out of facility DNR (pink MOST or yellow form)  Does patient want to make changes to medical advance directive? -  Copy of [place] in Chart? Yes  Pre-existing out of facility DNR order (yellow form or pink MOST form) Yellow form placed in chart (order not valid for inpatient use)     Chief Complaint  Patient presents with  . Medical Management of Chronic Issues    routine visit    HPI:  [name] is a 83 y.o. female seen today for medical management of chronic diseases.    Bronchiectasis without complication ([place]) - continues with bronchial rattle, wheezing , and DOE. Although lungs sound terrible she does not seem any worse with her breathing overall.  Chronic combined systolic and diastolic congestive heart failure (HCC) - Controlled. Improved pedal edema.  Anemia of chronic disease - hgb 10.2.  Edema,  unspecified type - improved  Essential hypertension - controlled  Hyponatremia - Na+ 139  Type 2 diabetes with decreased circulation ([place]) - controlled. A1c 7.2     Past Medical History:  Diagnosis Date  . Abnormality of gait   . Anemia, unspecified   . Arthritis   . Bronchiectasis without acute exacerbation ([place]) 04/10/2011   CT chest 2011   . Cataract 1990   Dr. Katy Fitch  . Cholelithiases   . Closed fracture of unspecified part of ramus of mandible   . COPD (chronic obstructive pulmonary disease) ([place])   . Coronary atherosclerosis of unspecified type of vessel, native or graft   . Disturbance of skin sensation   . DOE (dyspnea on exertion) 04/10/2011   PFTs 2013:  FEV1 1.04 (78%), ratio 64, couldn't do maneuver for lung volumes or dlco   . Edema   . Esophageal reflux   . Fall from other slipping, tripping, or stumbling   . Gastric ulcer with hemorrhage 2008  . Hypertension   . Hypoxemia   . Insomnia, unspecified   . Mucopurulent chronic bronchitis ([place])   . Nodule of neck 11/07/2013   Left neck posteriorly   . Nontoxic uninodular goiter   . Osteoarthrosis, unspecified whether generalized or localized, unspecified site   . Other and unspecified hyperlipidemia   . Other malaise and fatigue   . Pain in joint, lower leg 2010   right knee  . Pain in joint, shoulder region 2013   left  . Palpitations   . Pneumonia, organism unspecified(486)   . Regional enteritis of unspecified site   . Rosacea   . Sciatica   .  Squamous cell carcinoma of skin of lower limb, including hip 07/27/2012   Nonhealing lesion of the left mid calf medially   . Tachycardia 12/06/2014  . Tension headache   . Tension headache   . Type II or unspecified type diabetes mellitus without mention of complication, not stated as uncontrolled   . Unspecified venous (peripheral) insufficiency   . Urine, incontinence, stress female 07/19/2013   Past Surgical History:  Procedure Laterality Date  . EYE SURGERY  Bilateral 1990   Dr. Katy Fitch  . GASTROJEJUNOSTOMY  05/2004   secondary to ulcer  . JOINT REPLACEMENT Bilateral 1993-1998   total knee replacement  . MOLE REMOVAL  05/11/14   left hand and left side of face [facility]  . OTHER SURGICAL HISTORY  2008   Ulcer surgery   . SMALL INTESTINE SURGERY    . TOTAL KNEE ARTHROPLASTY Bilateral 1993, 1998   knees    Allergies  Allergen Reactions  . Adhesive [Tape]     unknown  . Aspirin     unknown  . Augmentin [Amoxicillin-Pot Clavulanate]   . Codeine     unknown  . Diclofenac   . Enalapril Cough  . Hydrocodone     unknown  . Pantoprazole Sodium     unknown  . Voltaren [Diclofenac Sodium]     Allergies as of 02/28/2016      Reactions   Adhesive [tape]    unknown   Aspirin    unknown   Augmentin [amoxicillin-pot Clavulanate]    Codeine    unknown   Diclofenac    Enalapril Cough   Hydrocodone    unknown   Pantoprazole Sodium    unknown   Voltaren [diclofenac Sodium]       Medication List       Accurate as of 02/28/16 12:53 PM. Always use your most recent med list.          acetaminophen 500 MG tablet Commonly known as:  TYLENOL Take 500 mg by mouth. Take one once daily in morning, take 2 at bedtime   acetaminophen 325 MG tablet Commonly known as:  TYLENOL Take 650 mg by mouth every 6 (six) hours as needed for moderate pain or headache.   atorvastatin 10 MG tablet Commonly known as:  LIPITOR Take 10 mg by mouth daily.   carbamide peroxide 6.5 % otic solution Commonly known as:  DEBROX 5 drops. Put 5 drops in left ear for 3 nights   doxazosin 8 MG tablet Commonly known as:  CARDURA Take 8 mg by mouth daily. To control BP.   furosemide 40 MG tablet Commonly known as:  LASIX Take 40 mg by mouth. Take one tablet twice a day   HUMALOG 100 UNIT/ML cartridge Generic drug:  insulin lispro Inject 8 Units into the skin. Take twice daily For CBG >150 tive 8 units BS > 250, give 12 units once a day     insulin glargine 100 UNIT/ML injection Commonly known as:  LANTUS Inject 15 Units into the skin at bedtime.   ipratropium-albuterol 0.5-2.5 (3) MG/3ML Soln Commonly known as:  DUONEB Take 3 mLs by nebulization. Use 1 vial via nebulizer 3 times a day as needed for shortness of breath or wheezing   loratadine 10 MG tablet Commonly known as:  CLARITIN Take 10 mg by mouth. Take one tablet daily as needed for runny nose for 48 hours   losartan 50 MG tablet Commonly known as:  COZAAR Take 50 mg by mouth  daily.   metoprolol tartrate 25 MG tablet Commonly known as:  LOPRESSOR Take 25 mg by mouth 2 (two) times daily.   multivitamin with minerals tablet Take 1 tablet by mouth daily.   omeprazole 20 MG capsule Commonly known as:  PRILOSEC Take 20 mg by mouth daily.   predniSONE 5 MG tablet Commonly known as:  DELTASONE Take 5 mg by mouth daily.   ROBITUSSIN DM PO Take by mouth. 10 cc every 6 hours as needed for cough for 48 hours   spironolactone 25 MG tablet Commonly known as:  ALDACTONE Take 25 mg by mouth daily.   zolpidem 5 MG tablet Commonly known as:  AMBIEN Take 5 mg by mouth at bedtime as needed.       Review of Systems  Constitutional: Negative for chills, diaphoresis and fever.       Baseline mobility is motorized scooter  HENT: Positive for hearing loss and tinnitus. Negative for congestion, ear discharge and ear pain.   Eyes: Negative for photophobia and pain.  Respiratory: Positive for cough, shortness of breath and wheezing.        Lungs generally sound terrible. She has chronic bronchiectasis which leads to a persistent bronchial rattle, wheeze, and cough. Improved cough and SOB, mild wheezes on exertion  Cardiovascular: Positive for leg swelling. Negative for chest pain and palpitations.       1+ edema BLE,  Right knee pain.   Gastrointestinal: Positive for constipation. Negative for abdominal pain, diarrhea, nausea and vomiting.  Endocrine:        Insulin using DM  Genitourinary: Positive for frequency. Negative for dysuria and urgency.       Recurrent urinary tract infections. Nocturia. Urinary frequency.  Musculoskeletal: Negative for back pain, myalgias and neck pain.       R knee pain  Skin: Positive for rash.       Itching around neck. Chronic venous dermatitis BLE  Allergic/Immunologic: Negative for environmental allergies.  Neurological: Negative for dizziness, tremors, seizures, weakness and headaches.       Left lateral occipital area hot flashes  Psychiatric/Behavioral: Positive for sleep disturbance (Insomnia. She will fall asleep, but consistently wakes up at 1:30 AM and then finds it difficult to fall asleep for the rest of the night.). Negative for hallucinations and suicidal ideas. The patient is not nervous/anxious.     Immunization History  Administered Date(s) Administered  . Influenza Split 11/06/2011, 11/04/2012  . Influenza,inj,Quad PF,36+ Mos 11/17/2013  . Influenza-Unspecified 11/09/2014, 11/16/2015  . PPD Test 11/29/2014  . Pneumococcal Conjugate-13 08/11/2014  . Pneumococcal Polysaccharide-23 02/04/2003  . Tdap 02/27/2013  . Zoster 02/03/2006   Pertinent  Health Maintenance Due  Topic Date Due  . OPHTHALMOLOGY EXAM  [birth year]29  . DEXA SCAN  10/31/1984  . FOOT EXAM  11/25/2014  . HEMOGLOBIN A1C  06/18/2016  . INFLUENZA VACCINE  Completed  . PNA vac Low Risk Adult  Completed   Fall Risk  12/06/2014 07/11/2014 05/22/2014 11/07/2013 08/30/2013  Falls in the past year? Yes No Yes No No  Number falls in past yr: 1 - 1 - -  Injury with Fall? No - No - -  Risk for fall due to : History of fall(s);Impaired mobility - History of fall(s);Impaired balance/gait - Impaired balance/gait;Impaired mobility  Follow up - - Falls evaluation completed - -   Functional Status Survey:    Vitals:   02/28/16 1239  BP: 138/76  Pulse: 73  Resp: (!) 22  Temp: 98.1 F (  36.7 C)  SpO2: 96%  Weight: 204 lb (92.5 kg)   Height: 5\' 4"  (1.626 m)   Body mass index is 35.02 kg/m. Physical Exam  Constitutional: She is oriented to person, place, and time. She appears well-developed and well-nourished. No distress.  HENT:  Head: Normocephalic and atraumatic.  Right Ear: External ear normal.  Left Ear: External ear normal.  Nose: Nose normal.  Eyes: Conjunctivae and EOM are normal. Pupils are equal, round, and reactive to light.  Neck: Neck supple. No JVD present. No tracheal deviation present. No thyromegaly present.  Cardiovascular: Normal rate, regular rhythm, normal heart sounds and intact distal pulses.  Exam reveals no gallop and no friction rub.   No murmur heard. Pulmonary/Chest: No respiratory distress. She has wheezes. She has rales.  Moist rales posterior mid to lower lungs. Trace edema BLE. Mild exertional wheezes  Abdominal: Bowel sounds are normal. She exhibits no distension and no mass. There is no tenderness.  Musculoskeletal: She exhibits tenderness. Edema: 1+  bipedal.  Scars from bilateral TKR. Right knee seems to be loose when stressing the joint. No effusion. Instable gait. Driving a power scooter Chronic R knee pain  Lymphadenopathy:    She has no cervical adenopathy.  Neurological: She is alert and oriented to person, place, and time. She has normal reflexes. No cranial nerve deficit. Coordination normal.  Diminished vibratory and monofilament testing in both feet.  Skin: No rash (Reddened rash on both legs. Some itching and burning associated with the rash.) noted. No erythema. No pallor.  Prior exam: 6 mm nodule of the left posterior neck at the SCM muscle seems resolved Likely SCC left leg medial to shin.  Psychiatric: She has a normal mood and affect. Her behavior is normal. Judgment and thought content normal.    Labs reviewed:  Recent Labs  12/03/15 12/20/15 02/11/16  NA 136* 135* 139  K 4.4 4.5 4.2  BUN 40* 27* 35*  CREATININE 0.9 0.7 0.8    Recent Labs  03/15/15  08/27/15 11/26/15  AST 20 14 18   ALT 12 11 15   ALKPHOS 85 105 94    Recent Labs  03/15/15 08/27/15 11/26/15  WBC 11.6 8.5 7.7  HGB 10.7* 10.7* 10.2*  HCT 35* 33* 31*  PLT 220 249 221   Lab Results  Component Value Date   TSH 3.08 08/27/2015   Lab Results  Component Value Date   HGBA1C 7.2 12/20/2015   Lab Results  Component Value Date   CHOL 109 07/19/2015   HDL 75 (A) 07/19/2015   LDLCALC 26 07/19/2015   TRIG 39 (A) 07/19/2015   Assessment/Plan 1. Bronchiectasis without complication ([place]) unchanged  2. Chronic combined systolic and diastolic congestive heart failure ([place]) controlled  3. Anemia of chronic disease Chronic -CBC in 1 month  4. Edema, unspecified type Improved -CMP 1 month  5. Essential hypertension controlled  6. Hyponatremia -CMP 1 month  7. Type 2 diabetes with decreased circulation (HCC) Controlled -A1c, CMP in 1 month

## 2016-03-06 DIAGNOSIS — I10 Essential (primary) hypertension: Secondary | ICD-10-CM | POA: Diagnosis not present

## 2016-03-06 DIAGNOSIS — J069 Acute upper respiratory infection, unspecified: Secondary | ICD-10-CM | POA: Diagnosis not present

## 2016-03-06 DIAGNOSIS — E119 Type 2 diabetes mellitus without complications: Secondary | ICD-10-CM | POA: Diagnosis not present

## 2016-03-11 ENCOUNTER — Non-Acute Institutional Stay (SKILLED_NURSING_FACILITY): Payer: Medicare Other | Admitting: Nurse Practitioner

## 2016-03-11 ENCOUNTER — Encounter: Payer: Self-pay | Admitting: Nurse Practitioner

## 2016-03-11 DIAGNOSIS — E1151 Type 2 diabetes mellitus with diabetic peripheral angiopathy without gangrene: Secondary | ICD-10-CM

## 2016-03-11 DIAGNOSIS — K5900 Constipation, unspecified: Secondary | ICD-10-CM

## 2016-03-11 DIAGNOSIS — R609 Edema, unspecified: Secondary | ICD-10-CM | POA: Diagnosis not present

## 2016-03-11 DIAGNOSIS — K219 Gastro-esophageal reflux disease without esophagitis: Secondary | ICD-10-CM | POA: Diagnosis not present

## 2016-03-11 DIAGNOSIS — D638 Anemia in other chronic diseases classified elsewhere: Secondary | ICD-10-CM | POA: Diagnosis not present

## 2016-03-11 DIAGNOSIS — R42 Dizziness and giddiness: Secondary | ICD-10-CM | POA: Diagnosis not present

## 2016-03-11 DIAGNOSIS — J410 Simple chronic bronchitis: Secondary | ICD-10-CM | POA: Diagnosis not present

## 2016-03-11 DIAGNOSIS — I5042 Chronic combined systolic (congestive) and diastolic (congestive) heart failure: Secondary | ICD-10-CM

## 2016-03-11 DIAGNOSIS — G4700 Insomnia, unspecified: Secondary | ICD-10-CM

## 2016-03-11 DIAGNOSIS — I10 Essential (primary) hypertension: Secondary | ICD-10-CM

## 2016-03-11 DIAGNOSIS — I872 Venous insufficiency (chronic) (peripheral): Secondary | ICD-10-CM

## 2016-03-11 NOTE — Progress Notes (Signed)
Location:    [facility]ursing Home Room Number: 16 Place of Service:  SNF (31) Provider:  Jaxsin Bottomley, [place]  NP  Jeanmarie Hubert, MD  Patient Care Team: Estill Dooms, MD as PCP - General (Internal Medicine) Clent Jacks, MD (Ophthalmology) Adrian Prows, MD as Attending Physician (Cardiology) [facility] [name], MD as Consulting Physician (Orthopedic Surgery) Kathee Delton, MD as Consulting Physician (Pulmonary Disease) Salah Nakamura Otho Darner, NP as Nurse Practitioner (Internal Medicine)  Extended Emergency Contact Information Primary Emergency Contact: Ullmer,[name] Address: [address]          [place], [place] [address] [place] Phone: [phone] Mobile Phone: [phone] Relation: Son Secondary Emergency Contact: Bernards,[name]  [place] Mobile Phone: [phone] Relation: Daughter  Code Status:  DNR Goals of care: Advanced Directive information Advanced Directives 03/11/2016  Does Patient Have a Medical Advance Directive? Yes  Type of Paramedic of [place];Living will;Out of facility DNR (pink MOST or yellow form)  Does patient want to make changes to medical advance directive? No - Patient declined  Copy of [place] in Chart? Yes  Pre-existing out of facility DNR order (yellow form or pink MOST form) Yellow form placed in chart (order not valid for inpatient use)     Chief Complaint  Patient presents with  . Acute Visit    dizziness    HPI:  Pt is a 83 y.o. female seen today for an acute visit for dizziness, nausea this am, resolved w/o intervention, denied HA, abd pain, constipation, or diarrhea, denied chest pain, palpitation, she is afebrile. No new focal neurological deficits noted.   Hx of BLE chronic venous dermatitis, takes Furosemide 40mg  bid,  Spironolactone 25mg . COPD chronic SOB at her baseline, taking Prednisone 5mg , Hgb a1c 8 recently, taking Insulin to manage blood sugar.      Past  Medical History:  Diagnosis Date  . Abnormality of gait   . Anemia, unspecified   . Arthritis   . Bronchiectasis without acute exacerbation ([place]) 04/10/2011   CT chest 2011   . Cataract 1990   Dr. Katy Fitch  . Cholelithiases   . Closed fracture of unspecified part of ramus of mandible   . COPD (chronic obstructive pulmonary disease) ([place])   . Coronary atherosclerosis of unspecified type of vessel, native or graft   . Disturbance of skin sensation   . DOE (dyspnea on exertion) 04/10/2011   PFTs 2013:  FEV1 1.04 (78%), ratio 64, couldn't do maneuver for lung volumes or dlco   . Edema   . Esophageal reflux   . Fall from other slipping, tripping, or stumbling   . Gastric ulcer with hemorrhage 2008  . Hypertension   . Hypoxemia   . Insomnia, unspecified   . Mucopurulent chronic bronchitis ([place])   . Nodule of neck 11/07/2013   Left neck posteriorly   . Nontoxic uninodular goiter   . Osteoarthrosis, unspecified whether generalized or localized, unspecified site   . Other and unspecified hyperlipidemia   . Other malaise and fatigue   . Pain in joint, lower leg 2010   right knee  . Pain in joint, shoulder region 2013   left  . Palpitations   . Pneumonia, organism unspecified(486)   . Regional enteritis of unspecified site   . Rosacea   . Sciatica   . Squamous cell carcinoma of skin of lower limb, including hip 07/27/2012   Nonhealing lesion of the left mid calf medially   .  Tachycardia 12/06/2014  . Tension headache   . Tension headache   . Type II or unspecified type diabetes mellitus without mention of complication, not stated as uncontrolled   . Unspecified venous (peripheral) insufficiency   . Urine, incontinence, stress female 07/19/2013   Past Surgical History:  Procedure Laterality Date  . EYE SURGERY Bilateral 1990   Dr. Katy Fitch  . GASTROJEJUNOSTOMY  05/2004   secondary to ulcer  . JOINT REPLACEMENT Bilateral 1993-1998   total knee replacement  . MOLE REMOVAL  05/11/14   left  hand and left side of face [facility]  . OTHER SURGICAL HISTORY  2008   Ulcer surgery   . SMALL INTESTINE SURGERY    . TOTAL KNEE ARTHROPLASTY Bilateral 1993, 1998   knees    Allergies  Allergen Reactions  . Adhesive [Tape]     unknown  . Aspirin     unknown  . Augmentin [Amoxicillin-Pot Clavulanate]   . Codeine     unknown  . Diclofenac   . Enalapril Cough  . Hydrocodone     unknown  . Pantoprazole Sodium     unknown  . Voltaren [Diclofenac Sodium]     Allergies as of 03/11/2016      Reactions   Adhesive [tape]    unknown   Aspirin    unknown   Augmentin [amoxicillin-pot Clavulanate]    Codeine    unknown   Diclofenac    Enalapril Cough   Hydrocodone    unknown   Pantoprazole Sodium    unknown   Voltaren [diclofenac Sodium]       Medication List       Accurate as of 03/11/16  4:22 PM. Always use your most recent med list.          acetaminophen 500 MG tablet Commonly known as:  TYLENOL Take 500 mg by mouth. Take one once daily in morning, take 2 at bedtime   acetaminophen 325 MG tablet Commonly known as:  TYLENOL Take 650 mg by mouth every 6 (six) hours as needed for moderate pain or headache.   atorvastatin 10 MG tablet Commonly known as:  LIPITOR Take 10 mg by mouth daily.   carbamide peroxide 6.5 % otic solution Commonly known as:  DEBROX 5 drops. Put 5 drops in left ear for 3 nights   doxazosin 8 MG tablet Commonly known as:  CARDURA Take 8 mg by mouth daily. To control BP.   furosemide 40 MG tablet Commonly known as:  LASIX Take 40 mg by mouth. Take one tablet twice a day   HUMALOG 100 UNIT/ML cartridge Generic drug:  insulin lispro Inject 8 Units into the skin. Take twice daily For CBG >150 tive 8 units BS > 250, give 12 units once a day   insulin glargine 100 UNIT/ML injection Commonly known as:  LANTUS Inject 15 Units into the skin at bedtime.   ipratropium-albuterol 0.5-2.5 (3) MG/3ML Soln Commonly known as:   DUONEB Take 3 mLs by nebulization. Use 1 vial via nebulizer 3 times a day as needed for shortness of breath or wheezing   loratadine 10 MG tablet Commonly known as:  CLARITIN Take 10 mg by mouth. Take one tablet daily as needed for runny nose for 48 hours   losartan 50 MG tablet Commonly known as:  COZAAR Take 50 mg by mouth daily.   metoprolol tartrate 25 MG tablet Commonly known as:  LOPRESSOR Take 25 mg by mouth 2 (two) times daily.  multivitamin with minerals tablet Take 1 tablet by mouth daily.   omeprazole 20 MG capsule Commonly known as:  PRILOSEC Take 20 mg by mouth daily.   predniSONE 5 MG tablet Commonly known as:  DELTASONE Take 5 mg by mouth daily.   ROBITUSSIN DM PO Take by mouth. 10 cc every 6 hours as needed for cough for 48 hours   spironolactone 25 MG tablet Commonly known as:  ALDACTONE Take 25 mg by mouth daily.   zolpidem 5 MG tablet Commonly known as:  AMBIEN Take 5 mg by mouth at bedtime as needed.       Review of Systems  Constitutional: Negative for chills, diaphoresis and fever.       Baseline mobility is motorized scooter  HENT: Positive for hearing loss and tinnitus. Negative for congestion, ear discharge and ear pain.   Eyes: Negative for photophobia and pain.  Respiratory: Positive for cough and shortness of breath. Negative for wheezing.        Lungs generally sound terrible. She has chronic bronchiectasis which leads to a persistent bronchial rattle, wheeze, and cough. Improved cough and SOB, mild wheezes on exertion  Cardiovascular: Positive for leg swelling. Negative for chest pain and palpitations.       1+ edema BLE,  Right knee pain.   Gastrointestinal: Positive for constipation. Negative for abdominal pain, diarrhea, nausea and vomiting.  Endocrine:       Insulin using DM  Genitourinary: Positive for frequency. Negative for dysuria and urgency.       Recurrent urinary tract infections. Nocturia. Urinary frequency.   Musculoskeletal: Negative for back pain, myalgias and neck pain.       R knee pain  Skin: Positive for rash.       Itching around neck. Chronic venous dermatitis BLE  Allergic/Immunologic: Negative for environmental allergies.  Neurological: Positive for dizziness. Negative for tremors, seizures, weakness and headaches.       Left lateral occipital area hot flashes  Psychiatric/Behavioral: Positive for sleep disturbance (Insomnia. She will fall asleep, but consistently wakes up at 1:30 AM and then finds it difficult to fall asleep for the rest of the night.). Negative for hallucinations and suicidal ideas. The patient is not nervous/anxious.     Immunization History  Administered Date(s) Administered  . Influenza Split 11/06/2011, 11/04/2012  . Influenza,inj,Quad PF,36+ Mos 11/17/2013  . Influenza-Unspecified 11/09/2014, 11/16/2015  . PPD Test 11/29/2014  . Pneumococcal Conjugate-13 08/11/2014  . Pneumococcal Polysaccharide-23 02/04/2003  . Tdap 02/27/2013  . Zoster 02/03/2006   Pertinent  Health Maintenance Due  Topic Date Due  . OPHTHALMOLOGY EXAM  10/31/1929  . DEXA SCAN  10/31/1984  . FOOT EXAM  11/25/2014  . HEMOGLOBIN A1C  06/18/2016  . INFLUENZA VACCINE  Completed  . PNA vac Low Risk Adult  Completed   Fall Risk  12/06/2014 07/11/2014 05/22/2014 11/07/2013 08/30/2013  Falls in the past year? Yes No Yes No No  Number falls in past yr: 1 - 1 - -  Injury with Fall? No - No - -  Risk for fall due to : History of fall(s);Impaired mobility - History of fall(s);Impaired balance/gait - Impaired balance/gait;Impaired mobility  Follow up - - Falls evaluation completed - -   Functional Status Survey:    Vitals:   03/11/16 1507  BP: 124/72  Pulse: 76  Resp: 20  Temp: 98.1 F (36.7 C)  SpO2: 97%  Weight: 205 lb (93 kg)  Height: 5\' 4"  (1.626 m)   Body mass  index is 35.19 kg/m. Physical Exam  Constitutional: She is oriented to person, place, and time. She appears  well-developed and well-nourished. No distress.  HENT:  Head: Normocephalic and atraumatic.  Right Ear: External ear normal.  Left Ear: External ear normal.  Nose: Nose normal.  Eyes: Conjunctivae and EOM are normal. Pupils are equal, round, and reactive to light.  Neck: Neck supple. No JVD present. No tracheal deviation present. No thyromegaly present.  Cardiovascular: Normal rate, regular rhythm, normal heart sounds and intact distal pulses.  Exam reveals no gallop and no friction rub.   No murmur heard. Pulmonary/Chest: No respiratory distress. She has no wheezes. She has rales.  Moist rales posterior mid to lower lungs. Trace edema BLE. Mild exertional wheezes  Abdominal: Bowel sounds are normal. She exhibits no distension and no mass. There is no tenderness.  Musculoskeletal: She exhibits edema (1+  bipedal) and tenderness.  Scars from bilateral TKR. Right knee seems to be loose when stressing the joint. No effusion. Instable gait. Driving a power scooter Chronic R knee pain  Lymphadenopathy:    She has no cervical adenopathy.  Neurological: She is alert and oriented to person, place, and time. She has normal reflexes. No cranial nerve deficit. Coordination normal.  Diminished vibratory and monofilament testing in both feet.  Skin: No rash (Reddened rash on both legs. Some itching and burning associated with the rash.) noted. No erythema. No pallor.  Prior exam: 6 mm nodule of the left posterior neck at the SCM muscle seems resolved Likely SCC left leg medial to shin.  Psychiatric: She has a normal mood and affect. Her behavior is normal. Judgment and thought content normal.    Labs reviewed:  Recent Labs  12/03/15 12/20/15 02/11/16  NA 136* 135* 139  K 4.4 4.5 4.2  BUN 40* 27* 35*  CREATININE 0.9 0.7 0.8    Recent Labs  03/15/15 08/27/15 11/26/15  AST 20 14 18   ALT 12 11 15   ALKPHOS 85 105 94    Recent Labs  03/15/15 08/27/15 11/26/15  WBC 11.6 8.5 7.7  HGB 10.7*  10.7* 10.2*  HCT 35* 33* 31*  PLT 220 249 221   Lab Results  Component Value Date   TSH 3.08 08/27/2015   Lab Results  Component Value Date   HGBA1C 7.2 12/20/2015   Lab Results  Component Value Date   CHOL 109 07/19/2015   HDL 75 (A) 07/19/2015   LDLCALC 26 07/19/2015   TRIG 39 (A) 07/19/2015    Significant Diagnostic Results in last 30 days:  No results found.  Assessment/Plan Dizziness dizziness, nausea this am, resolved w/o intervention, denied HA, abd pain, constipation, or diarrhea, denied chest pain, palpitation, she is afebrile. No new focal neurological deficits noted.  Update CBC CMP UA C/S  Edema Chronic edema BLE, Continue Furosemide and Spironolactone.  03/06/16 wbc 7.0, Hgb 10.4, plt 171, Na 139, K 4.2, Bun 38, creat 0.81, Hgb a1c 7.1   Hypertension controlled, continue Cardura 8mg  daily, Metoprolol 25mg  bid, Losartan 50mg  and prn Clonidine 0.1mg  fro Bp>160/100 - Continue statin    Chronic combined systolic and diastolic congestive heart failure (HCC) BLE, chronic edema, continue Furosemide and Spironolactone,  monitor for s/s of decompensation of CHF.     COPD (chronic obstructive pulmonary disease) (HCC) Chronic cough,  Continue Neb, Claritin, Prednisone, Observe the patient.   Esophageal reflux Stable, continue Omeprazole 20mg  daily    Constipation Stable, diet controlled.   Type 2 diabetes with decreased circulation (  [place]) Continue Lantus 15u qd, Humalog 8u with meals, total 8u if CBG>150, 03/06/16 Hgb a1c 7.1  Venous stasis dermatitis of both lower extremities Continue Furosemide 40mg  bid, Spironolactone 25mg  daily  Insomnia Stable, continue Ambien prn  Anemia of chronic disease Last Hgb 10.4 03/06/16     Family/ staff Communication: SNF  Labs/tests ordered: CBC CMP UA C/S

## 2016-03-11 NOTE — Assessment & Plan Note (Signed)
Last Hgb 10.4 03/06/16

## 2016-03-11 NOTE — Assessment & Plan Note (Signed)
Stable, continue Ambien prn  

## 2016-03-11 NOTE — Assessment & Plan Note (Signed)
Stable, diet controlled  

## 2016-03-11 NOTE — Assessment & Plan Note (Signed)
dizziness, nausea this am, resolved w/o intervention, denied HA, abd pain, constipation, or diarrhea, denied chest pain, palpitation, she is afebrile. No new focal neurological deficits noted.  Update CBC CMP UA C/S

## 2016-03-11 NOTE — Assessment & Plan Note (Signed)
Continue Furosemide 40mg  bid, Spironolactone 25mg  daily

## 2016-03-11 NOTE — Assessment & Plan Note (Signed)
BLE, chronic edema, continue Furosemide and Spironolactone,  monitor for s/s of decompensation of CHF.  

## 2016-03-11 NOTE — Assessment & Plan Note (Signed)
Chronic cough,  Continue Neb, Claritin, Prednisone, Observe the patient.

## 2016-03-11 NOTE — Assessment & Plan Note (Addendum)
Chronic edema BLE, Continue Furosemide and Spironolactone.  03/06/16 wbc 7.0, Hgb 10.4, plt 171, Na 139, K 4.2, Bun 38, creat 0.81, Hgb a1c 7.1

## 2016-03-11 NOTE — Assessment & Plan Note (Signed)
controlled, continue Cardura 8mg  daily, Metoprolol 25mg  bid, Losartan 50mg  and prn Clonidine 0.1mg  fro Bp>160/100 - Continue statin

## 2016-03-11 NOTE — Assessment & Plan Note (Signed)
Continue Lantus 15u qd, Humalog 8u with meals, total 8u if CBG>150, 03/06/16 Hgb a1c 7.1

## 2016-03-11 NOTE — Assessment & Plan Note (Signed)
Stable, continue Omeprazole 20mg daily.  

## 2016-03-12 DIAGNOSIS — N390 Urinary tract infection, site not specified: Secondary | ICD-10-CM | POA: Diagnosis not present

## 2016-03-13 DIAGNOSIS — E1151 Type 2 diabetes mellitus with diabetic peripheral angiopathy without gangrene: Secondary | ICD-10-CM | POA: Diagnosis not present

## 2016-03-13 DIAGNOSIS — E871 Hypo-osmolality and hyponatremia: Secondary | ICD-10-CM | POA: Diagnosis not present

## 2016-03-13 DIAGNOSIS — I10 Essential (primary) hypertension: Secondary | ICD-10-CM | POA: Diagnosis not present

## 2016-03-13 LAB — HEPATIC FUNCTION PANEL
ALT: 11 U/L (ref 7–35)
AST: 16 U/L (ref 13–35)
Alkaline Phosphatase: 80 U/L (ref 25–125)
Bilirubin, Total: 0.3 mg/dL

## 2016-03-13 LAB — CBC AND DIFFERENTIAL
HEMATOCRIT: 32 % — AB (ref 36–46)
Hemoglobin: 10.3 g/dL — AB (ref 12.0–16.0)
PLATELETS: 184 10*3/uL (ref 150–399)
WBC: 7 10^3/mL

## 2016-03-13 LAB — BASIC METABOLIC PANEL
BUN: 45 mg/dL — AB (ref 4–21)
CREATININE: 0.9 mg/dL (ref 0.5–1.1)
GLUCOSE: 133 mg/dL
POTASSIUM: 4.2 mmol/L (ref 3.4–5.3)
Sodium: 139 mmol/L (ref 137–147)

## 2016-03-13 LAB — HEMOGLOBIN A1C: HEMOGLOBIN A1C: 7

## 2016-04-08 ENCOUNTER — Non-Acute Institutional Stay (SKILLED_NURSING_FACILITY): Payer: Medicare Other | Admitting: Nurse Practitioner

## 2016-04-08 ENCOUNTER — Encounter: Payer: Self-pay | Admitting: Nurse Practitioner

## 2016-04-08 DIAGNOSIS — E871 Hypo-osmolality and hyponatremia: Secondary | ICD-10-CM

## 2016-04-08 DIAGNOSIS — R609 Edema, unspecified: Secondary | ICD-10-CM | POA: Diagnosis not present

## 2016-04-08 DIAGNOSIS — J410 Simple chronic bronchitis: Secondary | ICD-10-CM | POA: Diagnosis not present

## 2016-04-08 DIAGNOSIS — E1151 Type 2 diabetes mellitus with diabetic peripheral angiopathy without gangrene: Secondary | ICD-10-CM | POA: Diagnosis not present

## 2016-04-08 DIAGNOSIS — I5042 Chronic combined systolic (congestive) and diastolic (congestive) heart failure: Secondary | ICD-10-CM

## 2016-04-08 DIAGNOSIS — K219 Gastro-esophageal reflux disease without esophagitis: Secondary | ICD-10-CM

## 2016-04-08 DIAGNOSIS — G4700 Insomnia, unspecified: Secondary | ICD-10-CM

## 2016-04-08 DIAGNOSIS — D638 Anemia in other chronic diseases classified elsewhere: Secondary | ICD-10-CM

## 2016-04-08 DIAGNOSIS — I10 Essential (primary) hypertension: Secondary | ICD-10-CM

## 2016-04-08 DIAGNOSIS — L858 Other specified epidermal thickening: Secondary | ICD-10-CM | POA: Insufficient documentation

## 2016-04-08 DIAGNOSIS — M1990 Unspecified osteoarthritis, unspecified site: Secondary | ICD-10-CM

## 2016-04-08 NOTE — Assessment & Plan Note (Signed)
Stable, Hgb 10.3 03/14/16

## 2016-04-08 NOTE — Assessment & Plan Note (Signed)
Chronic edema BLE, Continue Furosemide and Spironolactone.  

## 2016-04-08 NOTE — Assessment & Plan Note (Signed)
Chronic cough,  Continue Neb, Claritin, Prednisone, Robitussin DM,  Observe the patient 

## 2016-04-08 NOTE — Assessment & Plan Note (Signed)
Stable, continue Ambien prn  

## 2016-04-08 NOTE — Assessment & Plan Note (Signed)
Stable, continue Omeprazole 20mg daily.  

## 2016-04-08 NOTE — Assessment & Plan Note (Addendum)
controlled, continue Cardura 8mg  daily, Metoprolol 25mg  bid, Losartan 50mg , statin, 03/14/16 wbc 7.0, Hgb 10.3, plt 184, Na 139, K 4.2, Bun 45, creat 0.86, Hgb a1c 7.0

## 2016-04-08 NOTE — Assessment & Plan Note (Signed)
Continue Lantus 15u qd, Humalog 8u with meals, total 8u if CBG>150, 03/14/16 Hgb a1c 7.0 

## 2016-04-08 NOTE — Assessment & Plan Note (Signed)
Cutaneous horn, injured, ready to peel off, removed with suture removal scissor, apply non adhesive dressing daily until healed.

## 2016-04-08 NOTE — Progress Notes (Signed)
Location:  [place] Room Number: 16 Place of Service:  SNF (31) Provider:  Mast, Manxie  NP  Jeanmarie Hubert, MD  Patient Care Team: Estill Dooms, MD as PCP - General (Internal Medicine) Clent Jacks, MD (Ophthalmology) Adrian Prows, MD as Attending Physician (Cardiology) [facility] [name], MD as Consulting Physician (Orthopedic Surgery) Kathee Delton, MD as Consulting Physician (Pulmonary Disease) Man Otho Darner, NP as Nurse Practitioner (Internal Medicine)  Extended Emergency Contact Information Primary Emergency Contact: Rister,[name] Address: [address]          [place], [place] [address] [place] Phone: [phone] Mobile Phone: [phone] Relation: Son Secondary Emergency Contact: Molesky,[name]  [place] Mobile Phone: [phone] Relation: Daughter  Code Status:  DNR Goals of care: Advanced Directive information Advanced Directives 04/08/2016  Does Patient Have a Medical Advance Directive? Yes  Type of Paramedic of [place];Living will;Out of facility DNR (pink MOST or yellow form)  Does patient want to make changes to medical advance directive? No - Patient declined  Copy of [place] in Chart? Yes  Pre-existing out of facility DNR order (yellow form or pink MOST form) Yellow form placed in chart (order not valid for inpatient use)     Chief Complaint  Patient presents with  . Medical Management of Chronic Issues    HPI:  Pt is a 83 y.o. female seen today for medical management of chronic diseases.      Hx of BLE chronic venous dermatitis, takes Furosemide 40mg  bid,  Spironolactone 25mg . COPD chronic SOB at her baseline, taking Prednisone 5mg , Claritin 10mg , DuoNebtid prn, Robitussin DM q6h prn, Hgb a1c 7.0 03/14/16, taking Insulin to manage blood sugar.  Zolpidem 5mg  prn for sleep. Blood pressure is controlled, taking Metoprolol 25mg  bid, Losartan 50mg   daily, Cardura 8mg  daily. Tylenol 500mg  am 1000mg  pm for OA pain.  Past Medical History:  Diagnosis Date  . Abnormality of gait   . Anemia, unspecified   . Arthritis   . Bronchiectasis without acute exacerbation ([place]) 04/10/2011   CT chest 2011   . Cataract 1990   Dr. Katy Fitch  . Cholelithiases   . Closed fracture of unspecified part of ramus of mandible   . COPD (chronic obstructive pulmonary disease) ([place])   . Coronary atherosclerosis of unspecified type of vessel, native or graft   . Disturbance of skin sensation   . DOE (dyspnea on exertion) 04/10/2011   PFTs 2013:  FEV1 1.04 (78%), ratio 64, couldn't do maneuver for lung volumes or dlco   . Edema   . Esophageal reflux   . Fall from other slipping, tripping, or stumbling   . Gastric ulcer with hemorrhage 2008  . Hypertension   . Hypoxemia   . Insomnia, unspecified   . Mucopurulent chronic bronchitis ([place])   . Nodule of neck 11/07/2013   Left neck posteriorly   . Nontoxic uninodular goiter   . Osteoarthrosis, unspecified whether generalized or localized, unspecified site   . Other and unspecified hyperlipidemia   . Other malaise and fatigue   . Pain in joint, lower leg 2010   right knee  . Pain in joint, shoulder region 2013   left  . Palpitations   . Pneumonia, organism unspecified(486)   . Regional enteritis of unspecified site   . Rosacea   . Sciatica   . Squamous cell carcinoma of skin of lower limb, including hip 07/27/2012  Nonhealing lesion of the left mid calf medially   . Tachycardia 12/06/2014  . Tension headache   . Tension headache   . Type II or unspecified type diabetes mellitus without mention of complication, not stated as uncontrolled   . Unspecified venous (peripheral) insufficiency   . Urine, incontinence, stress female 07/19/2013   Past Surgical History:  Procedure Laterality Date  . EYE SURGERY Bilateral 1990   Dr. Katy Fitch  . GASTROJEJUNOSTOMY  05/2004   secondary to ulcer  . JOINT REPLACEMENT  Bilateral 1993-1998   total knee replacement  . MOLE REMOVAL  05/11/14   left hand and left side of face [facility]  . OTHER SURGICAL HISTORY  2008   Ulcer surgery   . SMALL INTESTINE SURGERY    . TOTAL KNEE ARTHROPLASTY Bilateral 1993, 1998   knees    Allergies  Allergen Reactions  . Adhesive [Tape]     unknown  . Aspirin     unknown  . Augmentin [Amoxicillin-Pot Clavulanate]   . Codeine     unknown  . Diclofenac   . Enalapril Cough  . Hydrocodone     unknown  . Pantoprazole Sodium     unknown  . Voltaren [Diclofenac Sodium]     Allergies as of 04/08/2016      Reactions   Adhesive [tape]    unknown   Aspirin    unknown   Augmentin [amoxicillin-pot Clavulanate]    Codeine    unknown   Diclofenac    Enalapril Cough   Hydrocodone    unknown   Pantoprazole Sodium    unknown   Voltaren [diclofenac Sodium]       Medication List       Accurate as of 04/08/16  4:40 PM. Always use your most recent med list.          acetaminophen 500 MG tablet Commonly known as:  TYLENOL Take 500 mg by mouth. Take one once daily in morning, take 2 at bedtime   acetaminophen 325 MG tablet Commonly known as:  TYLENOL Take 650 mg by mouth every 6 (six) hours as needed for moderate pain or headache.   atorvastatin 10 MG tablet Commonly known as:  LIPITOR Take 10 mg by mouth daily.   carbamide peroxide 6.5 % otic solution Commonly known as:  DEBROX 5 drops. Put 5 drops in left ear for 3 nights   doxazosin 8 MG tablet Commonly known as:  CARDURA Take 8 mg by mouth daily. To control BP.   furosemide 40 MG tablet Commonly known as:  LASIX Take 40 mg by mouth. Take one tablet twice a day   HUMALOG 100 UNIT/ML cartridge Generic drug:  insulin lispro Inject 8 Units into the skin. Take twice daily For CBG >150 tive 8 units BS > 250, give 12 units once a day   insulin glargine 100 UNIT/ML injection Commonly known as:  LANTUS Inject 15 Units into the skin at  bedtime.   ipratropium-albuterol 0.5-2.5 (3) MG/3ML Soln Commonly known as:  DUONEB Take 3 mLs by nebulization. Use 1 vial via nebulizer 3 times a day as needed for shortness of breath or wheezing   loratadine 10 MG tablet Commonly known as:  CLARITIN Take 10 mg by mouth. Take one tablet daily as needed for runny nose for 48 hours   losartan 50 MG tablet Commonly known as:  COZAAR Take 50 mg by mouth daily.   metoprolol tartrate 25 MG tablet Commonly known as:  LOPRESSOR  Take 25 mg by mouth 2 (two) times daily.   multivitamin with minerals tablet Take 1 tablet by mouth daily.   omeprazole 20 MG capsule Commonly known as:  PRILOSEC Take 20 mg by mouth daily.   predniSONE 5 MG tablet Commonly known as:  DELTASONE Take 5 mg by mouth daily.   ROBITUSSIN DM PO Take by mouth. 10 cc every 6 hours as needed for cough for 48 hours   spironolactone 25 MG tablet Commonly known as:  ALDACTONE Take 25 mg by mouth daily.   zolpidem 5 MG tablet Commonly known as:  AMBIEN Take 5 mg by mouth at bedtime as needed.       Review of Systems  Constitutional: Negative for chills, diaphoresis and fever.       Baseline mobility is motorized scooter  HENT: Positive for hearing loss and tinnitus. Negative for congestion, ear discharge and ear pain.   Eyes: Negative for photophobia and pain.  Respiratory: Positive for cough and shortness of breath. Negative for wheezing.        Lungs generally sound terrible. She has chronic bronchiectasis which leads to a persistent bronchial rattle, wheeze, and cough. Improved cough and SOB, mild wheezes on exertion  Cardiovascular: Positive for leg swelling. Negative for chest pain and palpitations.       1+ edema BLE,  Right knee pain.   Gastrointestinal: Positive for constipation. Negative for abdominal pain, diarrhea, nausea and vomiting.  Endocrine:       Insulin using DM  Genitourinary: Positive for frequency. Negative for dysuria and urgency.        Recurrent urinary tract infections. Nocturia. Urinary frequency.  Musculoskeletal: Negative for back pain, myalgias and neck pain.       R knee pain  Skin: Positive for rash.       Itching around neck. Chronic venous dermatitis BLE  Allergic/Immunologic: Negative for environmental allergies.  Neurological: Positive for dizziness. Negative for tremors, seizures, weakness and headaches.       Left lateral occipital area hot flashes  Psychiatric/Behavioral: Positive for sleep disturbance (Insomnia. She will fall asleep, but consistently wakes up at 1:30 AM and then finds it difficult to fall asleep for the rest of the night.). Negative for hallucinations and suicidal ideas. The patient is not nervous/anxious.     Immunization History  Administered Date(s) Administered  . Influenza Split 11/06/2011, 11/04/2012  . Influenza,inj,Quad PF,36+ Mos 11/17/2013  . Influenza-Unspecified 11/09/2014, 11/16/2015  . PPD Test 11/29/2014  . Pneumococcal Conjugate-13 08/11/2014  . Pneumococcal Polysaccharide-23 02/04/2003  . Tdap 02/27/2013  . Zoster 02/03/2006   Pertinent  Health Maintenance Due  Topic Date Due  . OPHTHALMOLOGY EXAM  10/31/1927  . DEXA SCAN  10/31/1984  . FOOT EXAM  11/25/2014  . HEMOGLOBIN A1C  06/18/2016  . INFLUENZA VACCINE  Completed  . PNA vac Low Risk Adult  Completed   Fall Risk  12/06/2014 07/11/2014 05/22/2014 11/07/2013 08/30/2013  Falls in the past year? Yes No Yes No No  Number falls in past yr: 1 - 1 - -  Injury with Fall? No - No - -  Risk for fall due to : History of fall(s);Impaired mobility - History of fall(s);Impaired balance/gait - Impaired balance/gait;Impaired mobility  Follow up - - Falls evaluation completed - -   Functional Status Survey:    Vitals:   04/08/16 1247  BP: (!) 125/57  Pulse: 75  Resp: 20  Temp: 97.4 F (36.3 C)  SpO2: 93%  Weight: 204 lb (  92.5 kg)  Height: 5\' 4"  (1.626 m)   Body mass index is 35.02 kg/m. Physical Exam   Constitutional: She is oriented to person, place, and time. She appears well-developed and well-nourished. No distress.  HENT:  Head: Normocephalic and atraumatic.  Right Ear: External ear normal.  Left Ear: External ear normal.  Nose: Nose normal.  Eyes: Conjunctivae and EOM are normal. Pupils are equal, round, and reactive to light.  Neck: Neck supple. No JVD present. No tracheal deviation present. No thyromegaly present.  Cardiovascular: Normal rate, regular rhythm, normal heart sounds and intact distal pulses.  Exam reveals no gallop and no friction rub.   No murmur heard. Pulmonary/Chest: No respiratory distress. She has no wheezes. She has rales.  Moist rales posterior mid to lower lungs. Trace edema BLE. Mild exertional wheezes  Abdominal: Bowel sounds are normal. She exhibits no distension and no mass. There is no tenderness.  Musculoskeletal: She exhibits edema (1+  bipedal) and tenderness.  Scars from bilateral TKR. Right knee seems to be loose when stressing the joint. No effusion. Instable gait. Driving a power scooter Chronic R knee pain  Lymphadenopathy:    She has no cervical adenopathy.  Neurological: She is alert and oriented to person, place, and time. She has normal reflexes. No cranial nerve deficit. Coordination normal.  Diminished vibratory and monofilament testing in both feet.  Skin: No rash (Reddened rash on both legs. Some itching and burning associated with the rash.) noted. No erythema. No pallor.  Prior exam: 6 mm nodule of the left posterior neck at the SCM muscle seems resolved Likely SCC left leg medial to shin. Cutaneous horn, injured, ready to peel off, removed with suture removal scissor   Psychiatric: She has a normal mood and affect. Her behavior is normal. Judgment and thought content normal.    Labs reviewed:  Recent Labs  12/03/15 12/20/15 02/11/16  NA 136* 135* 139  K 4.4 4.5 4.2  BUN 40* 27* 35*  CREATININE 0.9 0.7 0.8    Recent  Labs  08/27/15 11/26/15  AST 14 18  ALT 11 15  ALKPHOS 105 94    Recent Labs  08/27/15 11/26/15  WBC 8.5 7.7  HGB 10.7* 10.2*  HCT 33* 31*  PLT 249 221   Lab Results  Component Value Date   TSH 3.08 08/27/2015   Lab Results  Component Value Date   HGBA1C 7.2 12/20/2015   Lab Results  Component Value Date   CHOL 109 07/19/2015   HDL 75 (A) 07/19/2015   LDLCALC 26 07/19/2015   TRIG 39 (A) 07/19/2015    Significant Diagnostic Results in last 30 days:  No results found.  Assessment/Plan Hypertension controlled, continue Cardura 8mg  daily, Metoprolol 25mg  bid, Losartan 50mg , statin, 03/14/16 wbc 7.0, Hgb 10.3, plt 184, Na 139, K 4.2, Bun 45, creat 0.86, Hgb a1c 7.0  Chronic combined systolic and diastolic congestive heart failure (HCC) BLE, chronic edema, continue Furosemide and Spironolactone,  monitor for s/s of decompensation of CHF.      COPD (chronic obstructive pulmonary disease) (HCC) Chronic cough,  Continue Neb, Claritin, Prednisone, Robitussin DM,  Observe the patient.   Esophageal reflux Stable, continue Omeprazole 20mg  daily   Insomnia Stable, continue Ambien prn  Type 2 diabetes with decreased circulation (HCC) Continue Lantus 15u qd, Humalog 8u with meals, total 8u if CBG>150, 03/14/16 Hgb a1c 7.0  Arthritis Multiple sites, continue Tylenol 500mg  am 1000mg  pm, prn  Edema Chronic edema BLE, Continue Furosemide and Spironolactone.  Hyponatremia Resolved, Na 139 03/14/16  Anemia of chronic disease Stable, Hgb 10.3 03/14/16  Cutaneous horn Cutaneous horn, injured, ready to peel off, removed with suture removal scissor, apply non adhesive dressing daily until healed.       Family/ staff Communication: SNF  Labs/tests ordered:  none

## 2016-04-08 NOTE — Assessment & Plan Note (Signed)
Multiple sites, continue Tylenol 500mg  am 1000mg  pm, prn

## 2016-04-08 NOTE — Assessment & Plan Note (Signed)
Resolved, Na 139 03/14/16

## 2016-04-08 NOTE — Assessment & Plan Note (Signed)
BLE, chronic edema, continue Furosemide and Spironolactone,  monitor for s/s of decompensation of CHF.  

## 2016-05-01 DIAGNOSIS — R05 Cough: Secondary | ICD-10-CM | POA: Diagnosis not present

## 2016-05-01 DIAGNOSIS — R062 Wheezing: Secondary | ICD-10-CM | POA: Diagnosis not present

## 2016-05-06 ENCOUNTER — Non-Acute Institutional Stay (SKILLED_NURSING_FACILITY): Payer: Medicare Other | Admitting: Nurse Practitioner

## 2016-05-06 ENCOUNTER — Encounter: Payer: Self-pay | Admitting: Nurse Practitioner

## 2016-05-06 DIAGNOSIS — N393 Stress incontinence (female) (male): Secondary | ICD-10-CM

## 2016-05-06 DIAGNOSIS — D638 Anemia in other chronic diseases classified elsewhere: Secondary | ICD-10-CM

## 2016-05-06 DIAGNOSIS — J189 Pneumonia, unspecified organism: Secondary | ICD-10-CM | POA: Insufficient documentation

## 2016-05-06 DIAGNOSIS — J181 Lobar pneumonia, unspecified organism: Secondary | ICD-10-CM

## 2016-05-06 DIAGNOSIS — E871 Hypo-osmolality and hyponatremia: Secondary | ICD-10-CM

## 2016-05-06 DIAGNOSIS — I5042 Chronic combined systolic (congestive) and diastolic (congestive) heart failure: Secondary | ICD-10-CM | POA: Diagnosis not present

## 2016-05-06 DIAGNOSIS — I10 Essential (primary) hypertension: Secondary | ICD-10-CM | POA: Diagnosis not present

## 2016-05-06 DIAGNOSIS — M1990 Unspecified osteoarthritis, unspecified site: Secondary | ICD-10-CM

## 2016-05-06 DIAGNOSIS — J410 Simple chronic bronchitis: Secondary | ICD-10-CM | POA: Diagnosis not present

## 2016-05-06 DIAGNOSIS — R609 Edema, unspecified: Secondary | ICD-10-CM

## 2016-05-06 DIAGNOSIS — K219 Gastro-esophageal reflux disease without esophagitis: Secondary | ICD-10-CM

## 2016-05-06 DIAGNOSIS — E1151 Type 2 diabetes mellitus with diabetic peripheral angiopathy without gangrene: Secondary | ICD-10-CM

## 2016-05-06 DIAGNOSIS — G4700 Insomnia, unspecified: Secondary | ICD-10-CM

## 2016-05-06 DIAGNOSIS — K5900 Constipation, unspecified: Secondary | ICD-10-CM

## 2016-05-06 NOTE — Assessment & Plan Note (Signed)
Continue Lantus 15u qd, Humalog 8u with meals, total 8u if CBG>150, 03/14/16 Hgb a1c 7.0

## 2016-05-06 NOTE — Assessment & Plan Note (Signed)
Chronic cough,  Continue Neb, Claritin, Prednisone, Robitussin DM,  Observe the patient

## 2016-05-06 NOTE — Assessment & Plan Note (Signed)
Stable, diet controlled  

## 2016-05-06 NOTE — Assessment & Plan Note (Signed)
Stable, continue Ambien prn

## 2016-05-06 NOTE — Assessment & Plan Note (Signed)
Chronic edema BLE, Continue Furosemide and Spironolactone.

## 2016-05-06 NOTE — Assessment & Plan Note (Signed)
BLE, chronic edema, continue Furosemide and Spironolactone,  monitor for s/s of decompensation of CHF.

## 2016-05-06 NOTE — Assessment & Plan Note (Signed)
fever, wheezing cough, CXR 05/01/16 showed mild patchy left basilar density compatible with PNA, 5 day course of Levauquin 750mg  qd started 05/02/16. Persisted wheezes and cough noted, she is afebrile, chronic O2 dependent.  05/06/16 adding Prednisone taper dose. Observe the patient.

## 2016-05-06 NOTE — Assessment & Plan Note (Signed)
Stable, continue Omeprazole 20mg daily.  

## 2016-05-06 NOTE — Assessment & Plan Note (Signed)
controlled, continue Cardura 8mg  daily, Metoprolol 25mg  bid, Losartan 50mg , statin,

## 2016-05-06 NOTE — Progress Notes (Signed)
Location:  [facility] Room Number: 16 Place of Service:  SNF (31) Provider:  Alcide Memoli, [place]  NP  Jeanmarie Hubert, MD  Patient Care Team: Estill Dooms, MD as PCP - General (Internal Medicine) Clent Jacks, MD (Ophthalmology) Adrian Prows, MD as Attending Physician (Cardiology) [facility] [name], MD as Consulting Physician (Orthopedic Surgery) Kathee Delton, MD as Consulting Physician (Pulmonary Disease) Jayvan Mcshan Otho Darner, NP as Nurse Practitioner (Internal Medicine)  Extended Emergency Contact Information Primary Emergency Contact: Hypes,[name] Address: [address]          [place], [place] [address] [place] Phone: [phone] Mobile Phone: [phone] Relation: Son Secondary Emergency Contact: Dauzat,[name]  [place] Mobile Phone: [phone] Relation: Daughter  Code Status:  DNR Goals of care: Advanced Directive information Advanced Directives 04/08/2016  Does Patient Have a Medical Advance Directive? Yes  Type of Paramedic of [place];Living will;Out of facility DNR (pink MOST or yellow form)  Does patient want to make changes to medical advance directive? No - Patient declined  Copy of [place] in Chart? Yes  Pre-existing out of facility DNR order (yellow form or pink MOST form) Yellow form placed in chart (order not valid for inpatient use)     Chief Complaint  Patient presents with  . Acute Visit    pneumonia    HPI:  Pt is a 83 y.o. female seen today for an acute visit for fever, wheezing cough, CXR 05/01/16 showed mild patchy left basilar density compatible with PNA, 5 day course of Levauquin 750mg  qd started 05/02/16. Persisted wheezes and cough noted, she is afebrile, chronic O2 dependent.   Hx of BLE chronic venous dermatitis, takes Furosemide 40mg  bid, Spironolactone 25mg . COPD chronic SOB and cough, taking Prednisone 5mg , Claritin 10mg , DuoNebtid prn,  Robitussin DM q6h prn, Hgb a1c 7.0 03/14/16, taking Insulin to manage blood sugar. Zolpidem 5mg  prn for sleep. Blood pressure is controlled, taking Metoprolol 25mg  bid, Losartan 50mg  daily, Cardura 8mg  daily. Tylenol 500mg  am 1000mg  pm for OA pain.    Past Medical History:  Diagnosis Date  . Abnormality of gait   . Anemia, unspecified   . Arthritis   . Bronchiectasis without acute exacerbation ([place]) 04/10/2011   CT chest 2011   . Cataract 1990   Dr. Katy Fitch  . Cholelithiases   . Closed fracture of unspecified part of ramus of mandible   . COPD (chronic obstructive pulmonary disease) ([place])   . Coronary atherosclerosis of unspecified type of vessel, native or graft   . Disturbance of skin sensation   . DOE (dyspnea on exertion) 04/10/2011   PFTs 2013:  FEV1 1.04 (78%), ratio 64, couldn't do maneuver for lung volumes or dlco   . Edema   . Esophageal reflux   . Fall from other slipping, tripping, or stumbling   . Gastric ulcer with hemorrhage 2008  . Hypertension   . Hypoxemia   . Insomnia, unspecified   . Mucopurulent chronic bronchitis ([place])   . Nodule of neck 11/07/2013   Left neck posteriorly   . Nontoxic uninodular goiter   . Osteoarthrosis, unspecified whether generalized or localized, unspecified site   . Other and unspecified hyperlipidemia   . Other malaise and fatigue   . Pain in joint, lower leg 2010   right knee  . Pain in joint, shoulder region 2013   left  . Palpitations   . Pneumonia, organism unspecified(486)   .  Regional enteritis of unspecified site   . Rosacea   . Sciatica   . Squamous cell carcinoma of skin of lower limb, including hip 07/27/2012   Nonhealing lesion of the left mid calf medially   . Tachycardia 12/06/2014  . Tension headache   . Tension headache   . Type II or unspecified type diabetes mellitus without mention of complication, not stated as uncontrolled   . Unspecified venous (peripheral) insufficiency   . Urine, incontinence, stress female  07/19/2013   Past Surgical History:  Procedure Laterality Date  . EYE SURGERY Bilateral 1990   Dr. Katy Fitch  . GASTROJEJUNOSTOMY  05/2004   secondary to ulcer  . JOINT REPLACEMENT Bilateral 1993-1998   total knee replacement  . MOLE REMOVAL  05/11/14   left hand and left side of face [facility]  . OTHER SURGICAL HISTORY  2008   Ulcer surgery   . SMALL INTESTINE SURGERY    . TOTAL KNEE ARTHROPLASTY Bilateral 1993, 1998   knees    Allergies  Allergen Reactions  . Adhesive [Tape]     unknown  . Aspirin     unknown  . Augmentin [Amoxicillin-Pot Clavulanate]   . Codeine     unknown  . Diclofenac   . Enalapril Cough  . Hydrocodone     unknown  . Pantoprazole Sodium     unknown  . Voltaren [Diclofenac Sodium]     Allergies as of 05/06/2016      Reactions   Adhesive [tape]    unknown   Aspirin    unknown   Augmentin [amoxicillin-pot Clavulanate]    Codeine    unknown   Diclofenac    Enalapril Cough   Hydrocodone    unknown   Pantoprazole Sodium    unknown   Voltaren [diclofenac Sodium]       Medication List       Accurate as of 05/06/16  3:15 PM. Always use your most recent med list.          acetaminophen 500 MG tablet Commonly known as:  TYLENOL Take 500 mg by mouth. Take one once daily in morning, take 2 at bedtime   acetaminophen 325 MG tablet Commonly known as:  TYLENOL Take 650 mg by mouth every 6 (six) hours as needed for moderate pain or headache.   atorvastatin 10 MG tablet Commonly known as:  LIPITOR Take 10 mg by mouth daily.   carbamide peroxide 6.5 % otic solution Commonly known as:  DEBROX 5 drops. Put 5 drops in left ear for 3 nights   doxazosin 8 MG tablet Commonly known as:  CARDURA Take 8 mg by mouth daily. To control BP.   furosemide 40 MG tablet Commonly known as:  LASIX Take 40 mg by mouth. Take one tablet twice a day   HUMALOG 100 UNIT/ML cartridge Generic drug:  insulin lispro Inject 8 Units into the skin. Take  twice daily For CBG >150 tive 8 units BS > 250, give 12 units once a day   insulin glargine 100 UNIT/ML injection Commonly known as:  LANTUS Inject 15 Units into the skin at bedtime.   ipratropium-albuterol 0.5-2.5 (3) MG/3ML Soln Commonly known as:  DUONEB Take 3 mLs by nebulization. Use 1 vial via nebulizer 3 times a day as needed for shortness of breath or wheezing   losartan 50 MG tablet Commonly known as:  COZAAR Take 50 mg by mouth daily.   metoprolol tartrate 25 MG tablet Commonly known as:  LOPRESSOR Take 25 mg by mouth 2 (two) times daily.   multivitamin with minerals tablet Take 1 tablet by mouth daily.   omeprazole 20 MG capsule Commonly known as:  PRILOSEC Take 20 mg by mouth daily.   predniSONE 5 MG tablet Commonly known as:  DELTASONE Take 5 mg by mouth daily.   ROBITUSSIN DM PO Take by mouth. 10 cc every 6 hours as needed for cough for 48 hours   spironolactone 25 MG tablet Commonly known as:  ALDACTONE Take 25 mg by mouth daily.   zolpidem 5 MG tablet Commonly known as:  AMBIEN Take 5 mg by mouth at bedtime as needed.       Review of Systems  Constitutional: Negative for chills, diaphoresis and fever.       Baseline mobility is motorized scooter  HENT: Positive for hearing loss and tinnitus. Negative for congestion, ear discharge and ear pain.   Eyes: Negative for photophobia and pain.  Respiratory: Positive for cough and shortness of breath. Negative for wheezing.        Lungs generally sound terrible. She has chronic bronchiectasis which leads to a persistent bronchial rattle, wheeze, and cough. Improved cough and SOB, mild wheezes on exertion  Cardiovascular: Positive for leg swelling. Negative for chest pain and palpitations.       1+ edema BLE,  Right knee pain.   Gastrointestinal: Positive for constipation. Negative for abdominal pain, diarrhea, nausea and vomiting.  Endocrine:       Insulin using DM  Genitourinary: Positive for frequency.  Negative for dysuria and urgency.       Recurrent urinary tract infections. Nocturia. Urinary frequency.  Musculoskeletal: Negative for back pain, myalgias and neck pain.       R knee pain  Skin: Positive for rash.       Itching around neck. Chronic venous dermatitis BLE  Allergic/Immunologic: Negative for environmental allergies.  Neurological: Positive for dizziness. Negative for tremors, seizures, weakness and headaches.       Left lateral occipital area hot flashes  Psychiatric/Behavioral: Positive for sleep disturbance (Insomnia. She will fall asleep, but consistently wakes up at 1:30 AM and then finds it difficult to fall asleep for the rest of the night.). Negative for hallucinations and suicidal ideas. The patient is not nervous/anxious.     Immunization History  Administered Date(s) Administered  . Influenza Split 11/06/2011, 11/04/2012  . Influenza,inj,Quad PF,36+ Mos 11/17/2013  . Influenza-Unspecified 11/09/2014, 11/16/2015  . PPD Test 11/29/2014  . Pneumococcal Conjugate-13 08/11/2014  . Pneumococcal Polysaccharide-23 02/04/2003  . Tdap 02/27/2013  . Zoster 02/03/2006   Pertinent  Health Maintenance Due  Topic Date Due  . OPHTHALMOLOGY EXAM  10/31/1929  . DEXA SCAN  10/31/1984  . FOOT EXAM  11/25/2014  . HEMOGLOBIN A1C  06/18/2016  . INFLUENZA VACCINE  09/03/2016  . PNA vac Low Risk Adult  Completed   Fall Risk  12/06/2014 07/11/2014 05/22/2014 11/07/2013 08/30/2013  Falls in the past year? Yes No Yes No No  Number falls in past yr: 1 - 1 - -  Injury with Fall? No - No - -  Risk for fall due to : History of fall(s);Impaired mobility - History of fall(s);Impaired balance/gait - Impaired balance/gait;Impaired mobility  Follow up - - Falls evaluation completed - -   Functional Status Survey:    There were no vitals filed for this visit. There is no height or weight on file to calculate BMI. Physical Exam  Constitutional: She is oriented to  person, place, and time. She  appears well-developed and well-nourished. No distress.  HENT:  Head: Normocephalic and atraumatic.  Right Ear: External ear normal.  Left Ear: External ear normal.  Nose: Nose normal.  Eyes: Conjunctivae and EOM are normal. Pupils are equal, round, and reactive to light.  Neck: Neck supple. No JVD present. No tracheal deviation present. No thyromegaly present.  Cardiovascular: Normal rate, regular rhythm, normal heart sounds and intact distal pulses.  Exam reveals no gallop and no friction rub.   No murmur heard. Pulmonary/Chest: No respiratory distress. She has wheezes. She has rales.  Moist rales posterior mid to lower lungs. Trace edema BLE. Mild exertional wheezes  Abdominal: Bowel sounds are normal. She exhibits no distension and no mass. There is no tenderness.  Musculoskeletal: She exhibits edema (1+  bipedal) and tenderness.  Scars from bilateral TKR. Right knee seems to be loose when stressing the joint. No effusion. Instable gait. Driving a power scooter Chronic R knee pain  Lymphadenopathy:    She has no cervical adenopathy.  Neurological: She is alert and oriented to person, place, and time. She has normal reflexes. No cranial nerve deficit. Coordination normal.  Diminished vibratory and monofilament testing in both feet.  Skin: No rash (Reddened rash on both legs. Some itching and burning associated with the rash.) noted. No erythema. No pallor.  Prior exam: 6 mm nodule of the left posterior neck at the SCM muscle seems resolved Likely SCC left leg medial to shin. Cutaneous horn, injured, ready to peel off, removed with suture removal scissor   Psychiatric: She has a normal mood and affect. Her behavior is normal. Judgment and thought content normal.    Labs reviewed:  Recent Labs  12/03/15 12/20/15 02/11/16  NA 136* 135* 139  K 4.4 4.5 4.2  BUN 40* 27* 35*  CREATININE 0.9 0.7 0.8    Recent Labs  08/27/15 11/26/15  AST 14 18  ALT 11 15  ALKPHOS 105 94     Recent Labs  08/27/15 11/26/15  WBC 8.5 7.7  HGB 10.7* 10.2*  HCT 33* 31*  PLT 249 221   Lab Results  Component Value Date   TSH 3.08 08/27/2015   Lab Results  Component Value Date   HGBA1C 7.2 12/20/2015   Lab Results  Component Value Date   CHOL 109 07/19/2015   HDL 75 (A) 07/19/2015   LDLCALC 26 07/19/2015   TRIG 39 (A) 07/19/2015    Significant Diagnostic Results in last 30 days:  No results found.  Assessment/Plan Left lower lobe pneumonia (HCC) fever, wheezing cough, CXR 05/01/16 showed mild patchy left basilar density compatible with PNA, 5 day course of Levauquin 750mg  qd started 05/02/16. Persisted wheezes and cough noted, she is afebrile, chronic O2 dependent.  05/06/16 adding Prednisone taper dose. Observe the patient.   Hypertension controlled, continue Cardura 8mg  daily, Metoprolol 25mg  bid, Losartan 50mg , statin,  Chronic combined systolic and diastolic congestive heart failure (HCC) BLE, chronic edema, continue Furosemide and Spironolactone,  monitor for s/s of decompensation of CHF.   COPD (chronic obstructive pulmonary disease) (HCC) Chronic cough,  Continue Neb, Claritin, Prednisone, Robitussin DM,  Observe the patient  Esophageal reflux Stable, continue Omeprazole 20mg  daily   Constipation Stable, diet controlled.   Type 2 diabetes with decreased circulation (HCC) Continue Lantus 15u qd, Humalog 8u with meals, total 8u if CBG>150, 03/14/16 Hgb a1c 7.0  Arthritis Continue Tylenol 500mg  am, 1000mg  pm  Edema Chronic edema BLE, Continue Furosemide and Spironolactone.  Urine, incontinence, stress female Adult depends, urinary leakage.   Insomnia Stable, continue Ambien prn   Hyponatremia Na 139 03/14/16  Anemia of chronic disease 03/14/16 Hgb 10.3     Family/ staff Communication: SNF  Labs/tests ordered:  none

## 2016-05-06 NOTE — Assessment & Plan Note (Signed)
Adult depends, urinary leakage.

## 2016-05-06 NOTE — Assessment & Plan Note (Signed)
Na 139 03/14/16

## 2016-05-06 NOTE — Assessment & Plan Note (Signed)
Continue Tylenol 500mg  am, 1000mg  pm

## 2016-05-06 NOTE — Assessment & Plan Note (Signed)
03/14/16 Hgb 10.3

## 2016-05-12 ENCOUNTER — Non-Acute Institutional Stay (SKILLED_NURSING_FACILITY): Payer: Medicare Other | Admitting: Internal Medicine

## 2016-05-12 ENCOUNTER — Encounter: Payer: Self-pay | Admitting: Internal Medicine

## 2016-05-12 DIAGNOSIS — D638 Anemia in other chronic diseases classified elsewhere: Secondary | ICD-10-CM | POA: Diagnosis not present

## 2016-05-12 DIAGNOSIS — J189 Pneumonia, unspecified organism: Secondary | ICD-10-CM

## 2016-05-12 DIAGNOSIS — I5042 Chronic combined systolic (congestive) and diastolic (congestive) heart failure: Secondary | ICD-10-CM | POA: Diagnosis not present

## 2016-05-12 DIAGNOSIS — E1151 Type 2 diabetes mellitus with diabetic peripheral angiopathy without gangrene: Secondary | ICD-10-CM

## 2016-05-12 DIAGNOSIS — J181 Lobar pneumonia, unspecified organism: Secondary | ICD-10-CM | POA: Diagnosis not present

## 2016-05-12 DIAGNOSIS — J479 Bronchiectasis, uncomplicated: Secondary | ICD-10-CM | POA: Diagnosis not present

## 2016-05-12 DIAGNOSIS — J410 Simple chronic bronchitis: Secondary | ICD-10-CM

## 2016-05-12 NOTE — Progress Notes (Signed)
Progress Note    Location:  [facility] Room Number: [place] of Service:  SNF [phone]) Provider:  Jeanmarie Hubert, MD  Patient Care Team: Estill Dooms, MD as PCP - General (Internal Medicine) Clent Jacks, MD (Ophthalmology) Adrian Prows, MD as Attending Physician (Cardiology) [facility] [name], MD as Consulting Physician (Orthopedic Surgery) Kathee Delton, MD as Consulting Physician (Pulmonary Disease) Man Otho Darner, NP as Nurse Practitioner (Internal Medicine)  Extended Emergency Contact Information Primary Emergency Contact: Evitts,[name] Address: [address]          [place], [place] [address] [name] of [place] Phone: [phone] Mobile Phone: [phone] Relation: Son Secondary Emergency Contact: Carreiro,[name]  [place] Mobile Phone: [phone] Relation: Daughter  Code Status:  DNR Goals of care: Advanced Directive information Advanced Directives 05/12/2016  Does Patient Have a Medical Advance Directive? Yes  Type of Paramedic of [place];Living will;Out of facility DNR (pink MOST or yellow form)  Does patient want to make changes to medical advance directive? -  Copy of [place] in Chart? Yes  Pre-existing out of facility DNR order (yellow form or pink MOST form) Yellow form placed in chart (order not valid for inpatient use)     Chief Complaint  Patient presents with  . Medical Management of Chronic Issues    routine visit    HPI:  Pt is a 83 y.o. female seen today for medical management of chronic diseases.    Simple chronic bronchitis (HCC) - chronic problem with purulent sputum. Also had probable pneumonia recently. Has been treateed with steroid boost and Levaquin and is feeling much better.  Chronic combined systolic and diastolic congestive heart failure (HCC) - seems compensated at this time  Bronchiectasis without complication (HCC) - chronic  source of recurrent infections  Anemia of chronic disease - stable  Pneumonia of left lower lobe due to infectious organism [facility])     Past Medical History:  Diagnosis Date  . Abnormality of gait   . Anemia, unspecified   . Arthritis   . Bronchiectasis without acute exacerbation ([place]) 04/10/2011   CT chest 2011   . Cataract 1990   Dr. Katy Fitch  . Cholelithiases   . Closed fracture of unspecified part of ramus of mandible   . COPD (chronic obstructive pulmonary disease) ([place])   . Coronary atherosclerosis of unspecified type of vessel, native or graft   . Disturbance of skin sensation   . DOE (dyspnea on exertion) 04/10/2011   PFTs 2013:  FEV1 1.04 (78%), ratio 64, couldn't do maneuver for lung volumes or dlco   . Edema   . Esophageal reflux   . Fall from other slipping, tripping, or stumbling   . Gastric ulcer with hemorrhage 2008  . Hypertension   . Hypoxemia   . Insomnia, unspecified   . Mucopurulent chronic bronchitis ([place])   . Nodule of neck 11/07/2013   Left neck posteriorly   . Nontoxic uninodular goiter   . Osteoarthrosis, unspecified whether generalized or localized, unspecified site   . Other and unspecified hyperlipidemia   . Other malaise and fatigue   . Pain in joint, lower leg 2010   right knee  . Pain in joint, shoulder region 2013   left  . Palpitations   . Pneumonia, organism unspecified(486)   . Regional enteritis of unspecified site   . Rosacea   . Sciatica   . Squamous cell carcinoma  of skin of lower limb, including hip 07/27/2012   Nonhealing lesion of the left mid calf medially   . Tachycardia 12/06/2014  . Tension headache   . Tension headache   . Type II or unspecified type diabetes mellitus without mention of complication, not stated as uncontrolled   . Unspecified venous (peripheral) insufficiency   . Urine, incontinence, stress female 07/19/2013   Past Surgical History:  Procedure Laterality Date  . EYE SURGERY Bilateral 1990   Dr. Katy Fitch  .  GASTROJEJUNOSTOMY  05/2004   secondary to ulcer  . JOINT REPLACEMENT Bilateral 1993-1998   total knee replacement  . MOLE REMOVAL  05/11/14   left hand and left side of face [facility]  . OTHER SURGICAL HISTORY  2008   Ulcer surgery   . SMALL INTESTINE SURGERY    . TOTAL KNEE ARTHROPLASTY Bilateral 1993, 1998   knees    Allergies  Allergen Reactions  . Adhesive [Tape]     unknown  . Aspirin     unknown  . Augmentin [Amoxicillin-Pot Clavulanate]   . Codeine     unknown  . Diclofenac   . Enalapril Cough  . Hydrocodone     unknown  . Pantoprazole Sodium     unknown  . Voltaren [Diclofenac Sodium]     Allergies as of 05/12/2016      Reactions   Adhesive [tape]    unknown   Aspirin    unknown   Augmentin [amoxicillin-pot Clavulanate]    Codeine    unknown   Diclofenac    Enalapril Cough   Hydrocodone    unknown   Pantoprazole Sodium    unknown   Voltaren [diclofenac Sodium]       Medication List       Accurate as of 05/12/16  3:50 PM. Always use your most recent med list.          acetaminophen 500 MG tablet Commonly known as:  TYLENOL Take 500 mg by mouth. Take one once daily in morning, take 2 at bedtime   acetaminophen 325 MG tablet Commonly known as:  TYLENOL Take 650 mg by mouth every 6 (six) hours as needed for moderate pain or headache.   atorvastatin 10 MG tablet Commonly known as:  LIPITOR Take 10 mg by mouth daily.   carbamide peroxide 6.5 % otic solution Commonly known as:  DEBROX 5 drops. Put 5 drops in left ear for 3 nights   doxazosin 8 MG tablet Commonly known as:  CARDURA Take 8 mg by mouth daily. To control BP.   furosemide 40 MG tablet Commonly known as:  LASIX Take 40 mg by mouth. Take one tablet twice a day   HUMALOG 100 UNIT/ML cartridge Generic drug:  insulin lispro Inject 8 Units into the skin. Take twice daily For CBG >150 tive 8 units BS > 250, give 12 units once a day   insulin glargine 100 UNIT/ML  injection Commonly known as:  LANTUS Inject 15 Units into the skin at bedtime.   ipratropium-albuterol 0.5-2.5 (3) MG/3ML Soln Commonly known as:  DUONEB Take 3 mLs by nebulization. Use 1 vial via nebulizer 3 times a day as needed for shortness of breath or wheezing   losartan 50 MG tablet Commonly known as:  COZAAR Take 50 mg by mouth daily.   metoprolol tartrate 25 MG tablet Commonly known as:  LOPRESSOR Take 25 mg by mouth 2 (two) times daily.   multivitamin with minerals tablet Take 1 tablet  by mouth daily.   omeprazole 20 MG capsule Commonly known as:  PRILOSEC Take 20 mg by mouth daily.   OXYGEN Inhale into the lungs. 3 liter via nasal canula   predniSONE 5 MG tablet Commonly known as:  DELTASONE Take 5 mg by mouth. Taper down 30mg , 2 days, 20mg  2 days, 10mg  2 days, 5 mg daily   ROBITUSSIN DM PO Take by mouth. 10 cc every 6 hours as needed for cough for 48 hours   spironolactone 25 MG tablet Commonly known as:  ALDACTONE Take 25 mg by mouth. Take 1/2 tablet once a day   zolpidem 5 MG tablet Commonly known as:  AMBIEN Take 5 mg by mouth at bedtime as needed.       Review of Systems  Constitutional: Negative for chills, diaphoresis and fever.       Baseline mobility is motorized scooter  HENT: Positive for hearing loss and tinnitus. Negative for congestion, ear discharge and ear pain.   Eyes: Negative for photophobia and pain.  Respiratory: Positive for cough and shortness of breath. Negative for wheezing.        Lungs generally sound terrible. She has chronic bronchiectasis which leads to a persistent bronchial rattle, wheeze, and cough. Improved cough and SOB, mild wheezes on exertion  Cardiovascular: Positive for leg swelling. Negative for chest pain and palpitations.       1+ edema BLE,  Right knee pain.   Gastrointestinal: Positive for constipation. Negative for abdominal pain, diarrhea, nausea and vomiting.  Endocrine:       Insulin using DM   Genitourinary: Positive for frequency. Negative for dysuria and urgency.       Recurrent urinary tract infections. Nocturia. Urinary frequency.  Musculoskeletal: Negative for back pain, myalgias and neck pain.       R knee pain  Skin: Positive for rash.       Itching around neck. Chronic venous dermatitis BLE  Allergic/Immunologic: Negative for environmental allergies.  Neurological: Positive for dizziness. Negative for tremors, seizures, weakness and headaches.       Left lateral occipital area hot flashes  Psychiatric/Behavioral: Positive for sleep disturbance (Insomnia. She will fall asleep, but consistently wakes up at 1:30 AM and then finds it difficult to fall asleep for the rest of the night.). Negative for hallucinations and suicidal ideas. The patient is not nervous/anxious.     Immunization History  Administered Date(s) Administered  . Influenza Split 11/06/2011, 11/04/2012  . Influenza,inj,Quad PF,36+ Mos 11/17/2013  . Influenza-Unspecified 11/09/2014, 11/16/2015  . PPD Test 11/29/2014  . Pneumococcal Conjugate-13 08/11/2014  . Pneumococcal Polysaccharide-23 02/04/2003  . Tdap 02/27/2013  . Zoster 02/03/2006   Pertinent  Health Maintenance Due  Topic Date Due  . OPHTHALMOLOGY EXAM  10/31/1929  . DEXA SCAN  10/31/1984  . FOOT EXAM  11/25/2014  . HEMOGLOBIN A1C  06/18/2016  . INFLUENZA VACCINE  09/03/2016  . PNA vac Low Risk Adult  Completed   Fall Risk  12/06/2014 07/11/2014 05/22/2014 11/07/2013 08/30/2013  Falls in the past year? Yes No Yes No No  Number falls in past yr: 1 - 1 - -  Injury with Fall? No - No - -  Risk for fall due to : History of fall(s);Impaired mobility - History of fall(s);Impaired balance/gait - Impaired balance/gait;Impaired mobility  Follow up - - Falls evaluation completed - -   Functional Status Survey:    Vitals:   05/12/16 1459  BP: (!) 156/79  Pulse: 83  Resp: (!) 24  Temp: 98.7 F (37.1 C)  SpO2: 95%  Weight: 207 lb (93.9 kg)   Height: 5\' 4"  (1.626 m)   Body mass index is 35.53 kg/m. Physical Exam  Constitutional: She is oriented to person, place, and time. She appears well-developed and well-nourished. No distress.  HENT:  Head: Normocephalic and atraumatic.  Right Ear: External ear normal.  Left Ear: External ear normal.  Nose: Nose normal.  Eyes: Conjunctivae and EOM are normal. Pupils are equal, round, and reactive to light.  Neck: Neck supple. No JVD present. No tracheal deviation present. No thyromegaly present.  Cardiovascular: Normal rate, regular rhythm, normal heart sounds and intact distal pulses.  Exam reveals no gallop and no friction rub.   No murmur heard. Pulmonary/Chest: No respiratory distress. She has wheezes. She has rales.  Moist rales posterior mid to lower lungs. Trace edema BLE. Mild exertional wheezes  Abdominal: Bowel sounds are normal. She exhibits no distension and no mass. There is no tenderness.  Musculoskeletal: She exhibits edema (1+  bipedal) and tenderness.  Scars from bilateral TKR. Right knee seems to be loose when stressing the joint. No effusion. Instable gait. Driving a power scooter Chronic R knee pain  Lymphadenopathy:    She has no cervical adenopathy.  Neurological: She is alert and oriented to person, place, and time. She has normal reflexes. No cranial nerve deficit. Coordination normal.  Diminished vibratory and monofilament testing in both feet.  Skin: No rash (Reddened rash on both legs. Some itching and burning associated with the rash.) noted. No erythema. No pallor.  Prior exam: 6 mm nodule of the left posterior neck at the SCM muscle seems resolved Likely SCC left leg medial to shin. Cutaneous horn, injured, ready to peel off, removed with suture removal scissor   Psychiatric: She has a normal mood and affect. Her behavior is normal. Judgment and thought content normal.    Labs reviewed:  Recent Labs  12/20/15 02/11/16 03/13/16  NA 135* 139 139  K  4.5 4.2 4.2  BUN 27* 35* 45*  CREATININE 0.7 0.8 0.9    Recent Labs  08/27/15 11/26/15 03/13/16  AST 14 18 16   ALT 11 15 11   ALKPHOS 105 94 80    Recent Labs  08/27/15 11/26/15 03/13/16  WBC 8.5 7.7 7.0  HGB 10.7* 10.2* 10.3*  HCT 33* 31* 32*  PLT 249 221 184   Lab Results  Component Value Date   TSH 3.08 08/27/2015   Lab Results  Component Value Date   HGBA1C 7.0 03/13/2016   Lab Results  Component Value Date   CHOL 109 07/19/2015   HDL 75 (A) 07/19/2015   LDLCALC 26 07/19/2015   TRIG 39 (A) 07/19/2015    Assessment/Plan 1. Simple chronic bronchitis ([place]) resolving  2. Chronic combined systolic and diastolic congestive heart failure (HCC) The current medical regimen is effective;  continue present plan and medications.  3. Bronchiectasis without complication ([place]) The current medical regimen is effective;  continue present plan and medications.  4. Anemia of chronic disease stable  5. Pneumonia of left lower lobe due to infectious organism ([place]) resolving  6. DM Glucose running higher since on steroid boost related to her acute pneumonia.

## 2016-07-04 ENCOUNTER — Encounter: Payer: Self-pay | Admitting: Nurse Practitioner

## 2016-07-04 ENCOUNTER — Non-Acute Institutional Stay (SKILLED_NURSING_FACILITY): Payer: Medicare Other | Admitting: Nurse Practitioner

## 2016-07-04 DIAGNOSIS — I10 Essential (primary) hypertension: Secondary | ICD-10-CM | POA: Diagnosis not present

## 2016-07-04 DIAGNOSIS — I5042 Chronic combined systolic (congestive) and diastolic (congestive) heart failure: Secondary | ICD-10-CM

## 2016-07-04 DIAGNOSIS — R609 Edema, unspecified: Secondary | ICD-10-CM

## 2016-07-04 DIAGNOSIS — J410 Simple chronic bronchitis: Secondary | ICD-10-CM

## 2016-07-04 DIAGNOSIS — D638 Anemia in other chronic diseases classified elsewhere: Secondary | ICD-10-CM

## 2016-07-04 DIAGNOSIS — K5900 Constipation, unspecified: Secondary | ICD-10-CM

## 2016-07-04 DIAGNOSIS — E1151 Type 2 diabetes mellitus with diabetic peripheral angiopathy without gangrene: Secondary | ICD-10-CM | POA: Diagnosis not present

## 2016-07-04 DIAGNOSIS — G4700 Insomnia, unspecified: Secondary | ICD-10-CM | POA: Diagnosis not present

## 2016-07-04 DIAGNOSIS — K219 Gastro-esophageal reflux disease without esophagitis: Secondary | ICD-10-CM

## 2016-07-04 DIAGNOSIS — E871 Hypo-osmolality and hyponatremia: Secondary | ICD-10-CM

## 2016-07-04 DIAGNOSIS — M1990 Unspecified osteoarthritis, unspecified site: Secondary | ICD-10-CM

## 2016-07-04 NOTE — Assessment & Plan Note (Signed)
Multiple sites, ontinue Tylenol 500mg  am, 1000mg  pm

## 2016-07-04 NOTE — Assessment & Plan Note (Signed)
Chronic edema BLE, Continue Furosemide 40mg  bid and Spironolactone 25mg  qd

## 2016-07-04 NOTE — Assessment & Plan Note (Signed)
controlled, continue Cardura 8mg  daily, Metoprolol 25mg  bid, Losartan 50mg , update CBC CMP TSH Hgb a1c.

## 2016-07-04 NOTE — Assessment & Plan Note (Signed)
BLE, chronic edema, continue Furosemide 40mg  bid and Spironolactone 25mg  qd,  monitor for s/s of decompensation of CHF

## 2016-07-04 NOTE — Assessment & Plan Note (Signed)
Last Na 139 03/14/16

## 2016-07-04 NOTE — Assessment & Plan Note (Signed)
Stable, continue Ambien 5mg  prn

## 2016-07-04 NOTE — Assessment & Plan Note (Signed)
Stable, continue Omeprazole 20mg daily.  

## 2016-07-04 NOTE — Progress Notes (Signed)
Location:  [facility] Room Number: 16 Place of Service:  SNF (31) Provider:  Leston Schueller, [place]xie  NP  Estill Dooms, MD  Patient Care Team: Estill Dooms, MD as PCP - General (Internal Medicine) Clent Jacks, MD (Ophthalmology) Adrian Prows, MD as Attending Physician (Cardiology) [place], [facility] [name], MD as Consulting Physician (Orthopedic Surgery) Clance, Armando Reichert, MD as Consulting Physician (Pulmonary Disease) Pernella Ackerley X, NP as Nurse Practitioner (Internal Medicine)  Extended Emergency Contact Information Primary Emergency Contact: Harnisch,[name] Address: [address]          [place], [place] [address] [place] Phone: [phone] Mobile Phone: [phone] Relation: Son Secondary Emergency Contact: [name] [place] Mobile Phone: [phone] Relation: Daughter  Code Status:  DNR Goals of care: Advanced Directive information Advanced Directives 07/04/2016  Does Patient Have a Medical Advance Directive? Yes  Type of Paramedic of [place];Living will;Out of facility DNR (pink MOST or yellow form)  Does patient want to make changes to medical advance directive? No - Patient declined  Copy of [place] in Chart? Yes  Pre-existing out of facility DNR order (yellow form or pink MOST form) Yellow form placed in chart (order not valid for inpatient use)     Chief Complaint  Patient presents with  . Medical Management of Chronic Issues    HPI:  Pt is a 83 y.o. female seen today for medical management of chronic diseases.      Hx of BLE chronic venous dermatitis, takes Furosemide 40mg  bid, Spironolactone 25mg . COPD chronic SOB and cough, taking Prednisone 5mg , Claritin 10mg , DuoNebtid prn, Robitussin DM q6h prn, Hgb a1c 7.0 03/14/16, taking Lantus 15u qd, Novolog 8 U bid, 12 U qd with meals to manage blood sugar. Zolpidem 5mg  prn for sleep. Blood pressure is  controlled, taking Metoprolol 25mg  bid, Losartan 50mg  daily, Cardura 8mg  daily. Tylenol 500mg  am 1000mg  pm for OA pain. GERD stable, taking Omeprazole 20mg  qd   Past Medical History:  Diagnosis Date  . Abnormality of gait   . Anemia, unspecified   . Arthritis   . Bronchiectasis without acute exacerbation ([place]) 04/10/2011   CT chest 2011   . Cataract 1990   Dr. Katy Fitch  . Cholelithiases   . Closed fracture of unspecified part of ramus of mandible   . COPD (chronic obstructive pulmonary disease) ([place])   . Coronary atherosclerosis of unspecified type of vessel, native or graft   . Disturbance of skin sensation   . DOE (dyspnea on exertion) 04/10/2011   PFTs 2013:  FEV1 1.04 (78%), ratio 64, couldn't do maneuver for lung volumes or dlco   . Edema   . Esophageal reflux   . Fall from other slipping, tripping, or stumbling   . Gastric ulcer with hemorrhage 2008  . Hypertension   . Hypoxemia   . Insomnia, unspecified   . Mucopurulent chronic bronchitis ([place])   . Nodule of neck 11/07/2013   Left neck posteriorly   . Nontoxic uninodular goiter   . Osteoarthrosis, unspecified whether generalized or localized, unspecified site   . Other and unspecified hyperlipidemia   . Other malaise and fatigue   . Pain in joint, lower leg 2010   right knee  . Pain in joint, shoulder region 2013   left  . Palpitations   . Pneumonia, organism unspecified(486)   . Regional enteritis of unspecified site   . Rosacea   .  Sciatica   . Squamous cell carcinoma of skin of lower limb, including hip 07/27/2012   Nonhealing lesion of the left mid calf medially   . Tachycardia 12/06/2014  . Tension headache   . Tension headache   . Type II or unspecified type diabetes mellitus without mention of complication, not stated as uncontrolled   . Unspecified venous (peripheral) insufficiency   . Urine, incontinence, stress female 07/19/2013   Past Surgical History:  Procedure Laterality Date  . EYE SURGERY Bilateral  1990   Dr. Katy Fitch  . GASTROJEJUNOSTOMY  05/2004   secondary to ulcer  . JOINT REPLACEMENT Bilateral 1993-1998   total knee replacement  . MOLE REMOVAL  05/11/14   left hand and left side [facility] face [facility]  . OTHER SURGICAL HISTORY  2008   Ulcer surgery   . SMALL INTESTINE SURGERY    . TOTAL KNEE ARTHROPLASTY Bilateral 1993, 1998   knees    Allergies  Allergen Reactions  . Adhesive [Tape]     unknown  . Aspirin     unknown  . Augmentin [Amoxicillin-Pot Clavulanate]   . Codeine     unknown  . Diclofenac   . Enalapril Cough  . Hydrocodone     unknown  . Pantoprazole Sodium     unknown  . Voltaren [Diclofenac Sodium]     Outpatient Encounter Prescriptions as of 07/04/2016  Medication Sig  . acetaminophen (TYLENOL) 325 MG tablet Take 650 mg by mouth every 6 (six) hours as needed for moderate pain or headache.  Marland Kitchen acetaminophen (TYLENOL) 500 MG tablet Take 500 mg by mouth. Take one once daily in morning, take 2 at bedtime  . atorvastatin (LIPITOR) 10 MG tablet Take 10 mg by mouth daily.  . carbamide peroxide (DEBROX) 6.5 % otic solution 5 drops. Put 5 drops in left ear for 3 nights  . Dextromethorphan-Guaifenesin (ROBITUSSIN DM PO) Take by mouth. 10 cc every 6 hours as needed for cough for 48 hours  . doxazosin (CARDURA) 8 MG tablet Take 8 mg by mouth daily. To control BP.  . furosemide (LASIX) 40 MG tablet Take 40 mg by mouth. Take one tablet twice a day  . insulin glargine (LANTUS) 100 UNIT/ML injection Inject 15 Units into the skin at bedtime.   . insulin lispro (HUMALOG) 100 UNIT/ML cartridge Inject 8 Units into the skin. Take twice daily For CBG >150 tive 8 units BS > 250, give 12 units once a day  . ipratropium-albuterol (DUONEB) 0.5-2.5 (3) MG/3ML SOLN Take 3 mLs by nebulization. Use 1 vial via nebulizer 3 times a day as needed for shortness of breath or wheezing  . losartan (COZAAR) 50 MG tablet Take 50 mg by mouth daily.  . metoprolol tartrate (LOPRESSOR) 25 MG  tablet Take 25 mg by mouth 2 (two) times daily.  . Multiple Vitamins-Minerals (MULTIVITAMIN WITH MINERALS) tablet Take 1 tablet by mouth daily.  Marland Kitchen omeprazole (PRILOSEC) 20 MG capsule Take 20 mg by mouth daily.   . OXYGEN Inhale into the lungs. 3 liter via nasal canula  . predniSONE (DELTASONE) 5 MG tablet Take 5 mg by mouth. Taper down 30mg , 2 days, 20mg  2 days, 10mg  2 days, 5 mg daily  . spironolactone (ALDACTONE) 25 MG tablet Take 25 mg by mouth. Take 1/2 tablet once a day  . zolpidem (AMBIEN) 5 MG tablet Take 5 mg by mouth at bedtime as needed.    No facility-administered encounter medications on file as of 07/04/2016.  Review of Systems  Constitutional: Negative for chills, diaphoresis and fever.       Baseline mobility is motorized scooter  HENT: Positive for hearing loss and tinnitus. Negative for congestion, ear discharge and ear pain.   Eyes: Negative for photophobia and pain.  Respiratory: Positive for cough and shortness of breath. Negative for wheezing.        Lungs generally sound terrible. She has chronic bronchiectasis which leads to a persistent bronchial rattle, wheeze, and cough. Improved cough and SOB, mild wheezes on exertion  Cardiovascular: Positive for leg swelling. Negative for chest pain and palpitations.       1+ edema BLE,  Right knee pain.   Gastrointestinal: Positive for constipation. Negative for abdominal pain, diarrhea, nausea and vomiting.  Endocrine:       Insulin using DM  Genitourinary: Positive for frequency. Negative for dysuria and urgency.       Recurrent urinary tract infections. Nocturia. Urinary frequency.  Musculoskeletal: Negative for back pain, myalgias and neck pain.       R knee pain  Skin: Positive for rash.       Itching around neck. Chronic venous dermatitis BLE  Allergic/Immunologic: Negative for environmental allergies.  Neurological: Positive for dizziness. Negative for tremors, seizures, weakness and headaches.       Left lateral  occipital area hot flashes  Psychiatric/Behavioral: Positive for sleep disturbance (Insomnia. She will fall asleep, but consistently wakes up at 1:30 AM and then finds it difficult to fall asleep for the rest of the night.). Negative for hallucinations and suicidal ideas. The patient is not nervous/anxious.     Immunization History  Administered Date(s) Administered  . Influenza Split 11/06/2011, 11/04/2012  . Influenza,inj,Quad PF,36+ Mos 11/17/2013  . Influenza-Unspecified 11/09/2014, 11/16/2015  . PPD Test 11/29/2014  . Pneumococcal Conjugate-13 08/11/2014  . Pneumococcal Polysaccharide-23 02/04/2003  . Tdap 02/27/2013  . Zoster 02/03/2006   Pertinent  Health Maintenance Due  Topic Date Due  . OPHTHALMOLOGY EXAM  10/31/1929  . DEXA SCAN  10/31/1984  . FOOT EXAM  11/25/2014  . INFLUENZA VACCINE  09/03/2016  . HEMOGLOBIN A1C  09/10/2016  . PNA vac Low Risk Adult  Completed   Fall Risk  12/06/2014 07/11/2014 05/22/2014 11/07/2013 08/30/2013  Falls in the past year? Yes No Yes No No  Number falls in past yr: 1 - 1 - -  Injury with Fall? No - No - -  Risk for fall due to : History of fall(s);Impaired mobility - History of fall(s);Impaired balance/gait - Impaired balance/gait;Impaired mobility  Follow up - - Falls evaluation completed - -   Functional Status Survey:    Vitals:   07/04/16 1203  BP: (!) 131/56  Pulse: 75  Resp: 20  Temp: 98.3 F (36.8 C)  SpO2: 95%  Weight: 206 lb (93.4 kg)  Height: 5\' 4"  (1.626 m)   Body mass index is 35.36 kg/m. Physical Exam  Constitutional: She is oriented to person, place, and time. She appears well-developed and well-nourished. No distress.  HENT:  Head: Normocephalic and atraumatic.  Right Ear: External ear normal.  Left Ear: External ear normal.  Nose: Nose normal.  Eyes: Conjunctivae and EOM are normal. Pupils are equal, round, and reactive to light.  Neck: Neck supple. No JVD present. No tracheal deviation present. No thyromegaly  present.  Cardiovascular: Normal rate, regular rhythm, normal heart sounds and intact distal pulses.  Exam reveals no gallop and no friction rub.   No murmur heard. Pulmonary/Chest: No respiratory  distress. She has wheezes. She has rales.  Moist rales posterior mid to lower lungs. Trace edema BLE. Mild exertional wheezes  Abdominal: Bowel sounds are normal. She exhibits no distension and no mass. There is no tenderness.  Musculoskeletal: She exhibits edema (1+  bipedal) and tenderness.  Scars from bilateral TKR. Right knee seems to be loose when stressing the joint. No effusion. Instable gait. Driving a power scooter Chronic R knee pain  Lymphadenopathy:    She has no cervical adenopathy.  Neurological: She is alert and oriented to person, place, and time. She has normal reflexes. No cranial nerve deficit. Coordination normal.  Diminished vibratory and monofilament testing in both feet.  Skin: No rash (Reddened rash on both legs. Some itching and burning associated with the rash.) noted. No erythema. No pallor.  Prior exam: 6 mm nodule of the left posterior neck at the SCM muscle seems resolved Likely SCC left leg medial to shin. Cutaneous horn, injured, ready to peel off, removed with suture removal scissor   Psychiatric: She has a normal mood and affect. Her behavior is normal. Judgment and thought content normal.    Labs reviewed:  Recent Labs  12/20/15 02/11/16 03/13/16  NA 135* 139 139  K 4.5 4.2 4.2  BUN 27* 35* 45*  CREATININE 0.7 0.8 0.9    Recent Labs  08/27/15 11/26/15 03/13/16  AST 14 18 16   ALT 11 15 11   ALKPHOS 105 94 80    Recent Labs  08/27/15 11/26/15 03/13/16  WBC 8.5 7.7 7.0  HGB 10.7* 10.2* 10.3*  HCT 33* 31* 32*  PLT 249 221 184   Lab Results  Component Value Date   TSH 3.08 08/27/2015   Lab Results  Component Value Date   HGBA1C 7.0 03/13/2016   Lab Results  Component Value Date   CHOL 109 07/19/2015   HDL 75 (A) 07/19/2015   LDLCALC 26  07/19/2015   TRIG 39 (A) 07/19/2015    Significant Diagnostic Results in last 30 days:  No results found.  Assessment/Plan Hypertension controlled, continue Cardura 8mg  daily, Metoprolol 25mg  bid, Losartan 50mg , update CBC CMP TSH Hgb a1c.   Chronic combined systolic and diastolic congestive heart failure (HCC) BLE, chronic edema, continue Furosemide 40mg  bid and Spironolactone 25mg  qd,  monitor for s/s of decompensation of CHF  COPD (chronic obstructive pulmonary disease) (HCC) Chronic cough,  Continue prn DuoNeb tid, Claritin 10mg , Prednisone 5mg  qd, Robitussin DM prn,  Observe the patient  Esophageal reflux Stable, continue Omeprazole 20mg  daily   Constipation Stable, diet controlled.   Type 2 diabetes with decreased circulation (HCC) Continue Lantus 15u qd, Humalog 8u bid, 12u qd with meals, total 8u if CBG>150, 03/14/16 Hgb a1c 7.0   Arthritis Multiple sites, ontinue Tylenol 500mg  am, 1000mg  pm  Edema Chronic edema BLE, Continue Furosemide 40mg  bid and Spironolactone 25mg  qd  Insomnia Stable, continue Ambien 5mg  prn  Hyponatremia Last Na 139 03/14/16  Anemia of chronic disease Hgb 10.3 03/14/16     Family/ staff Communication: SNF  Labs/tests ordered:  CBC CMP TSH Hgb a1c

## 2016-07-04 NOTE — Assessment & Plan Note (Signed)
Hgb 10.3 03/14/16

## 2016-07-04 NOTE — Assessment & Plan Note (Signed)
Stable, diet controlled  

## 2016-07-04 NOTE — Assessment & Plan Note (Signed)
Continue Lantus 15u qd, Humalog 8u bid, 12u qd with meals, total 8u if CBG>150, 03/14/16 Hgb a1c 7.0

## 2016-07-04 NOTE — Assessment & Plan Note (Signed)
Chronic cough,  Continue prn DuoNeb tid, Claritin 10mg , Prednisone 5mg  qd, Robitussin DM prn,  Observe the patient

## 2016-07-08 ENCOUNTER — Encounter: Payer: Self-pay | Admitting: Family

## 2016-07-08 NOTE — Progress Notes (Signed)
Opened in error

## 2016-07-10 DIAGNOSIS — I10 Essential (primary) hypertension: Secondary | ICD-10-CM | POA: Diagnosis not present

## 2016-07-10 DIAGNOSIS — R05 Cough: Secondary | ICD-10-CM | POA: Diagnosis not present

## 2016-07-10 DIAGNOSIS — R5383 Other fatigue: Secondary | ICD-10-CM | POA: Diagnosis not present

## 2016-07-10 DIAGNOSIS — E1151 Type 2 diabetes mellitus with diabetic peripheral angiopathy without gangrene: Secondary | ICD-10-CM | POA: Diagnosis not present

## 2016-07-10 LAB — BASIC METABOLIC PANEL
BUN: 43 — AB (ref 4–21)
CREATININE: 0.9 (ref 0.5–1.1)
Glucose: 141
POTASSIUM: 3.9 (ref 3.4–5.3)
Sodium: 141 (ref 137–147)

## 2016-07-10 LAB — TSH: TSH: 2.61 (ref 0.41–5.90)

## 2016-07-10 LAB — HEPATIC FUNCTION PANEL
ALK PHOS: 86 (ref 25–125)
ALT: 12 (ref 7–35)
AST: 14 (ref 13–35)
Bilirubin, Total: 0.2

## 2016-07-10 LAB — CBC AND DIFFERENTIAL
HCT: 30 — AB (ref 36–46)
HEMOGLOBIN: 9.8 — AB (ref 12.0–16.0)
Platelets: 168 (ref 150–399)
WBC: 8.1

## 2016-07-10 LAB — HEMOGLOBIN A1C: Hemoglobin A1C: 7.6

## 2016-07-11 ENCOUNTER — Encounter: Payer: Self-pay | Admitting: Internal Medicine

## 2016-07-11 ENCOUNTER — Non-Acute Institutional Stay (SKILLED_NURSING_FACILITY): Payer: Medicare Other | Admitting: Internal Medicine

## 2016-07-11 DIAGNOSIS — I5042 Chronic combined systolic (congestive) and diastolic (congestive) heart failure: Secondary | ICD-10-CM | POA: Diagnosis not present

## 2016-07-11 DIAGNOSIS — R3589 Other polyuria: Secondary | ICD-10-CM

## 2016-07-11 DIAGNOSIS — R600 Localized edema: Secondary | ICD-10-CM

## 2016-07-11 DIAGNOSIS — N183 Chronic kidney disease, stage 3 unspecified: Secondary | ICD-10-CM

## 2016-07-11 DIAGNOSIS — E1129 Type 2 diabetes mellitus with other diabetic kidney complication: Secondary | ICD-10-CM

## 2016-07-11 DIAGNOSIS — Z794 Long term (current) use of insulin: Secondary | ICD-10-CM | POA: Diagnosis not present

## 2016-07-11 DIAGNOSIS — R358 Other polyuria: Secondary | ICD-10-CM | POA: Diagnosis not present

## 2016-07-11 DIAGNOSIS — E1165 Type 2 diabetes mellitus with hyperglycemia: Secondary | ICD-10-CM | POA: Diagnosis not present

## 2016-07-11 DIAGNOSIS — IMO0002 Reserved for concepts with insufficient information to code with codable children: Secondary | ICD-10-CM

## 2016-07-11 NOTE — Progress Notes (Signed)
Location:  [place] Room Number: N-16 Place of Service:  SNF [phone]) Provider:    Blanchie Serve, MD  Patient Care Team: Blanchie Serve, MD as PCP - General (Internal Medicine) Clent Jacks, MD (Ophthalmology) Adrian Prows, MD as Attending Physician (Cardiology) [place], [facility] [name], MD as Consulting Physician (Orthopedic Surgery) Clance, Armando Reichert, MD as Consulting Physician (Pulmonary Disease) Ngetich, Nelda Bucks, NP as Nurse Practitioner [facility])  Extended Emergency Contact Information Primary Emergency Contact: [place],[name] Address: [address]          [place], [place] [address] [name] Phone: [phone] Mobile Phone: [phone] Relation: Son Secondary Emergency Contact: [name] Mobile Phone: [phone] Relation: Daughter  Code Status:  DNR  Goals of care: Advanced Directive information Advanced Directives 07/08/2016  Does Patient Have a Medical Advance Directive? Yes  Type of Paramedic of [place];Living will;Out of facility DNR (pink MOST or yellow form)  Does patient want to make changes to medical advance directive? -  Copy of [place] in Chart? -  Pre-existing out of facility DNR order (yellow form or pink MOST form) Yellow form placed in chart (order not valid for inpatient use)     Chief Complaint  Patient presents with  . Acute Visit    Abnormal labs    HPI:  Pt is a 83 y.o. female seen today for an acute visit for abnormal lab readings. She has deranged renal function and elevated a1c. She has history of diabetes and on chart review has elevated cbg of 231. 291, 313, 388 among others mainly before lunch and supper time. Reviewed her BP reading with history of HTN and readings have been stable with SBP 130s-140s and DBP 60-80s. Reviewed her medication list and she is on furosemide and losartan that can be  nephrotoxic. She has PMH of chronic CHF, HTN, DM, COPD on chronic prednisone among others.    Past Medical History:  Diagnosis Date  . Abnormality of gait   . Anemia, unspecified   . Arthritis   . Bronchiectasis without acute exacerbation ([place]) 04/10/2011   CT chest 2011   . Cataract 1990   Dr. Katy Fitch  . Cholelithiases   . Closed fracture of unspecified part of ramus of mandible   . COPD (chronic obstructive pulmonary disease) ([place])   . Coronary atherosclerosis of unspecified type of vessel, native or graft   . Disturbance of skin sensation   . DOE (dyspnea on exertion) 04/10/2011   PFTs 2013:  FEV1 1.04 (78%), ratio 64, couldn't do maneuver for lung volumes or dlco   . Edema   . Esophageal reflux   . Fall from other slipping, tripping, or stumbling   . Gastric ulcer with hemorrhage 2008  . Hypertension   . Hypoxemia   . Insomnia, unspecified   . Mucopurulent chronic bronchitis ([place])   . Nodule of neck 11/07/2013   Left neck posteriorly   . Nontoxic uninodular goiter   . Osteoarthrosis, unspecified whether generalized or localized, unspecified site   . Other and unspecified hyperlipidemia   . Other malaise and fatigue   . Pain in joint, lower leg 2010   right knee  . Pain in joint, shoulder region 2013   left  . Palpitations   . Pneumonia, organism unspecified(486)   . Regional enteritis of unspecified site   . Rosacea   . Sciatica   . Squamous cell  carcinoma of skin of lower limb, including hip 07/27/2012   Nonhealing lesion of the left mid calf medially   . Tachycardia 12/06/2014  . Tension headache   . Tension headache   . Type II or unspecified type diabetes mellitus without mention of complication, not stated as uncontrolled   . Unspecified venous (peripheral) insufficiency   . Urine, incontinence, stress female 07/19/2013   Past Surgical History:  Procedure Laterality Date  . EYE SURGERY Bilateral 1990   Dr. Katy Fitch  . GASTROJEJUNOSTOMY  05/2004   secondary to ulcer   . JOINT REPLACEMENT Bilateral 1993-1998   total knee replacement  . MOLE REMOVAL  05/11/14   left hand and left side of face [facility]  . OTHER SURGICAL HISTORY  2008   Ulcer surgery   . SMALL INTESTINE SURGERY    . TOTAL KNEE ARTHROPLASTY Bilateral 1993, 1998   knees    Allergies  Allergen Reactions  . Adhesive [Tape]     unknown  . Aspirin     unknown  . Augmentin [Amoxicillin-Pot Clavulanate]   . Codeine     unknown  . Diclofenac   . Enalapril Cough  . Hydrocodone     unknown  . Pantoprazole Sodium     unknown  . Voltaren [Diclofenac Sodium]     Outpatient Encounter Prescriptions as of 07/11/2016  Medication Sig  . acetaminophen (TYLENOL) 325 MG tablet Take 650 mg by mouth every 6 (six) hours as needed for moderate pain or headache.  Marland Kitchen acetaminophen (TYLENOL) 500 MG tablet Take 500 mg by mouth. Take one once daily in morning, take 2 at bedtime  . atorvastatin (LIPITOR) 10 MG tablet Take 10 mg by mouth daily.  . carbamide peroxide (DEBROX) 6.5 % otic solution 5 drops. Put 5 drops in left ear for 3 nights  . Dextromethorphan-Guaifenesin (ROBITUSSIN DM PO) Take by mouth. 10 cc every 6 hours as needed for cough for 48 hours  . doxazosin (CARDURA) 8 MG tablet Take 8 mg by mouth daily. To control BP.  . furosemide (LASIX) 40 MG tablet Take 40 mg by mouth. Take one tablet twice a day  . insulin glargine (LANTUS) 100 UNIT/ML injection Inject 15 Units into the skin at bedtime.   . insulin lispro (HUMALOG) 100 UNIT/ML cartridge Inject 8 Units into the skin. Take twice daily For CBG >150 tive 8 units BS > 250, give 12 units once a day  . ipratropium-albuterol (DUONEB) 0.5-2.5 (3) MG/3ML SOLN Take 3 mLs by nebulization. Use 1 vial via nebulizer 3 times a day as needed for shortness of breath or wheezing  . loperamide (IMODIUM A-D) 2 MG tablet Take 2 mg by mouth 4 (four) times daily as needed for diarrhea or loose stools.  Marland Kitchen losartan (COZAAR) 50 MG tablet Take 50 mg by mouth  daily.  . metoprolol tartrate (LOPRESSOR) 25 MG tablet Take 25 mg by mouth 2 (two) times daily.  . Multiple Vitamins-Minerals (MULTIVITAMIN WITH MINERALS) tablet Take 1 tablet by mouth daily.  Marland Kitchen omeprazole (PRILOSEC) 20 MG capsule Take 20 mg by mouth daily.   . OXYGEN Inhale into the lungs. 3 liter via nasal canula  . predniSONE (DELTASONE) 5 MG tablet Take 5 mg by mouth daily with breakfast.  . spironolactone (ALDACTONE) 25 MG tablet Take 12.5 mg by mouth daily.   Marland Kitchen zolpidem (AMBIEN) 5 MG tablet Take 5 mg by mouth at bedtime.    No facility-administered encounter medications on file as of 07/11/2016.  Review of Systems  Constitutional: Negative for appetite change, diaphoresis and fever.  HENT: Negative for congestion.   Respiratory: Negative for cough and shortness of breath.        On oxygen by nasal canula  Cardiovascular: Negative for chest pain and palpitations.       Has chronic leg edema but swelling has decreased  Gastrointestinal: Negative for abdominal pain, diarrhea, nausea and vomiting.  Endocrine: Positive for polyuria.  Genitourinary: Negative for dysuria.  Musculoskeletal: Negative for arthralgias.  Neurological: Negative for dizziness.    Immunization History  Administered Date(s) Administered  . Influenza Split 11/06/2011, 11/04/2012  . Influenza,inj,Quad PF,36+ Mos 11/17/2013  . Influenza-Unspecified 11/09/2014, 11/16/2015  . PPD Test 11/29/2014  . Pneumococcal Conjugate-13 08/11/2014  . Pneumococcal Polysaccharide-23 02/04/2003  . Pneumococcal-Unspecified 06/07/2014  . Tdap 02/27/2013  . Zoster 02/03/2006   Pertinent  Health Maintenance Due  Topic Date Due  . OPHTHALMOLOGY EXAM  10/31/1929  . DEXA SCAN  10/31/1984  . FOOT EXAM  11/25/2014  . INFLUENZA VACCINE  09/03/2016  . HEMOGLOBIN A1C  09/10/2016  . PNA vac Low Risk Adult  Completed   Fall Risk  12/06/2014 07/11/2014 05/22/2014 11/07/2013 08/30/2013  Falls in the past year? Yes No Yes No No  Number  falls in past yr: 1 - 1 - -  Injury with Fall? No - No - -  Risk for fall due to : History of fall(s);Impaired mobility - History of fall(s);Impaired balance/gait - Impaired balance/gait;Impaired mobility  Follow up - - Falls evaluation completed - -   Functional Status Survey:    Vitals:   07/11/16 1204  BP: 134/62  Pulse: 69  Resp: (!) 24  Temp: 97.8 F (36.6 C)  TempSrc: Oral  SpO2: 96%  Weight: 208 lb (94.3 kg)  Height: 5\' 7"  (1.702 m)   Body mass index is 32.58 kg/m. Physical Exam  Constitutional: She is oriented to person, place, and time. No distress.  obese  HENT:  Head: Normocephalic and atraumatic.  Mouth/Throat: Oropharynx is clear and moist.  Eyes: Pupils are equal, round, and reactive to light.  Neck: Normal range of motion. Neck supple.  Cardiovascular: Normal rate and regular rhythm.   Pulmonary/Chest: Effort normal and breath sounds normal. She has no wheezes. She has no rales.  Abdominal: Soft. Bowel sounds are normal.  Musculoskeletal: She exhibits edema.  On wheelchair and has trace leg edema. Chronic skin changes to lower legs  Neurological: She is alert and oriented to person, place, and time.  Skin: Skin is warm and dry. She is not diaphoretic.  Psychiatric: She has a normal mood and affect. Her behavior is normal.    Labs reviewed:  Recent Labs  02/11/16 03/13/16 07/10/16  NA 139 139 141  K 4.2 4.2 3.9  BUN 35* 45* 43*  CREATININE 0.8 0.9 0.9    Recent Labs  11/26/15 03/13/16 07/10/16  AST 18 16 14   ALT 15 11 12   ALKPHOS 94 80 86    Recent Labs  11/26/15 03/13/16 07/10/16  WBC 7.7 7.0 8.1  HGB 10.2* 10.3* 9.8*  HCT 31* 32* 30*  PLT 221 184 168   Lab Results  Component Value Date   TSH 2.61 07/10/2016   Lab Results  Component Value Date   HGBA1C 7.6 07/10/2016   Lab Results  Component Value Date   CHOL 109 07/19/2015   HDL 75 (A) 07/19/2015   LDLCALC 26 07/19/2015   TRIG 39 (A) 07/19/2015    Significant  Diagnostic  Results in last 30 days:  No results found.  Assessment/Plan  Uncontrolled diabetes mellitus Lab Results  Component Value Date   HGBA1C 7.6 07/10/2016   Increased from 7.3 and elevated cbg reading. Increase her pre lunch and pre dinner lispro to 10 u from 8 u. Increase lantus to 18 u daily. Check a1c in 3 month  ckd stage 3 With her medical co-morbidities and medication likely worsening this. Decrease her lasix to 40 mg daily from bid and monitor bmp  chf Improved leg edema. Continue losartan and decrease dosing of lasix as above. Monitor weight and bmp. Adjust lasix dosing if needed.  Leg edema Improved. Decrease lasix as above. With her chf and venous stasis, keep legs elevated at rest.   Polyuria Likely with poorly controlled diabetes. Changes to insulin order as above. If no improvement or worsens, rule out infection   Family/ staff Communication: reviewed care plan with patient and charge nurse  Labs/tests ordered:  Bmp in 1 week

## 2016-07-14 DIAGNOSIS — E871 Hypo-osmolality and hyponatremia: Secondary | ICD-10-CM | POA: Diagnosis not present

## 2016-07-14 LAB — BASIC METABOLIC PANEL
BUN: 30 — AB (ref 4–21)
CREATININE: 0.9 (ref 0.5–1.1)
Glucose: 132
Potassium: 4.1 (ref 3.4–5.3)
Sodium: 139 (ref 137–147)

## 2016-07-17 ENCOUNTER — Encounter: Payer: Self-pay | Admitting: Family

## 2016-07-17 ENCOUNTER — Non-Acute Institutional Stay (SKILLED_NURSING_FACILITY): Payer: Medicare Other | Admitting: Family

## 2016-07-17 DIAGNOSIS — E1151 Type 2 diabetes mellitus with diabetic peripheral angiopathy without gangrene: Secondary | ICD-10-CM | POA: Diagnosis not present

## 2016-07-17 DIAGNOSIS — J449 Chronic obstructive pulmonary disease, unspecified: Secondary | ICD-10-CM | POA: Diagnosis not present

## 2016-07-17 DIAGNOSIS — R600 Localized edema: Secondary | ICD-10-CM | POA: Diagnosis not present

## 2016-07-17 MED ORDER — IPRATROPIUM-ALBUTEROL 0.5-2.5 (3) MG/3ML IN SOLN: 3.0000 mL | Freq: Three times a day (TID) | RESPIRATORY_TRACT | Status: DC

## 2016-07-17 NOTE — Progress Notes (Signed)
Location:  [facility] Room Number: 16 Place of Service:  SNF ([phone]) Provider: Dinah Ngetich FNP-C  Blanchie Serve, MD  Patient Care Team: Blanchie Serve, MD as PCP - General (Internal Medicine) Clent Jacks, MD (Ophthalmology) Adrian Prows, MD as Attending Physician (Cardiology) [place], Friends [facility] [name], MD as Consulting Physician (Orthopedic Surgery) Clance, Armando Reichert, MD as Consulting Physician (Pulmonary Disease) Ngetich, Nelda Bucks, NP as Nurse Practitioner [facility])  Extended Emergency Contact Information Primary Emergency Contact: Brodzinski,[name] Address: [address]          [place], [place] [address] [name] of [place] Phone: [phone] Mobile Phone: [phone] Relation: Son Secondary Emergency Contact: [name] [place] of [place] Mobile Phone: [phone] Relation: Daughter  Code Status: DNR  Goals of care: Advanced Directive information Advanced Directives 07/17/2016  Does Patient Have a Medical Advance Directive? Yes  Type of Advance Directive Out of facility DNR (pink MOST or yellow form);Living will;Healthcare Power of Attorney  Does patient want to make changes to medical advance directive? -  Copy of [place] in Chart? Yes  Pre-existing out of facility DNR order (yellow form or pink MOST form) Yellow form placed in chart (order not valid for inpatient use)     Chief Complaint  Patient presents with  . Acute Visit    medication review; diabetic eye exam    HPI:  Pt is a 83 y.o. female seen today at [facility] for an acute visit for evaluation of medication. She has a significant medical history of HTN, COPD, Type 2 DM, CHF,CKD stage 3,GERD,Anemia, Hyperlipidemia, chronic venous insufficiency among other conditions. She is seen in her room today. She denies any acute issues this visit.Her facility CBG log reviewed CBG now ranging in the 140's-160's. Insulin recently adjusted by  Dr. Bubba Camp. She continues to require her Duoneb breathing treatment at least twice daily. Patient states received letter from insurance Duoneb not being covered. Plan to obtain a prior authorization.   Past Medical History:  Diagnosis Date  . Abnormality of gait   . Anemia, unspecified   . Arthritis   . Bronchiectasis without acute exacerbation ([place]) 04/10/2011   CT chest 2011   . Cataract 1990   Dr. Katy Fitch  . Cholelithiases   . Closed fracture of unspecified part of ramus of mandible   . COPD (chronic obstructive pulmonary disease) ([place])   . Coronary atherosclerosis of unspecified type of vessel, native or graft   . Disturbance of skin sensation   . DOE (dyspnea on exertion) 04/10/2011   PFTs 2013:  FEV1 1.04 (78%), ratio 64, couldn't do maneuver for lung volumes or dlco   . Edema   . Esophageal reflux   . Fall from other slipping, tripping, or stumbling   . Gastric ulcer with hemorrhage 2008  . Hypertension   . Hypoxemia   . Insomnia, unspecified   . Mucopurulent chronic bronchitis ([place])   . Nodule of neck 11/07/2013   Left neck posteriorly   . Nontoxic uninodular goiter   . Osteoarthrosis, unspecified whether generalized or localized, unspecified site   . Other and unspecified hyperlipidemia   . Other malaise and fatigue   . Pain in joint, lower leg 2010   right knee  . Pain in joint, shoulder region 2013   left  . Palpitations   . Pneumonia, organism unspecified(486)   . Regional enteritis of unspecified site   . Rosacea   . Sciatica   .  Squamous cell carcinoma of skin of lower limb, including hip 07/27/2012   Nonhealing lesion of the left mid calf medially   . Tachycardia 12/06/2014  . Tension headache   . Tension headache   . Type II or unspecified type diabetes mellitus without mention of complication, not stated as uncontrolled   . Unspecified venous (peripheral) insufficiency   . Urine, incontinence, stress female 07/19/2013   Past Surgical History:  Procedure  Laterality Date  . EYE SURGERY Bilateral 1990   Dr. Katy Fitch  . GASTROJEJUNOSTOMY  05/2004   secondary to ulcer  . JOINT REPLACEMENT Bilateral 1993-1998   total knee replacement  . MOLE REMOVAL  05/11/14   left hand and left side of face [facility]  . OTHER SURGICAL HISTORY  2008   Ulcer surgery   . SMALL INTESTINE SURGERY    . TOTAL KNEE ARTHROPLASTY Bilateral 1993, 1998   knees    Allergies  Allergen Reactions  . Adhesive [Tape]     unknown  . Aspirin     unknown  . Augmentin [Amoxicillin-Pot Clavulanate]   . Codeine     unknown  . Diclofenac   . Enalapril Cough  . Hydrocodone     unknown  . Pantoprazole Sodium     unknown  . Voltaren [Diclofenac Sodium]     Allergies as of 07/17/2016      Reactions   Adhesive [tape]    unknown   Aspirin    unknown   Augmentin [amoxicillin-pot Clavulanate]    Codeine    unknown   Diclofenac    Enalapril Cough   Hydrocodone    unknown   Pantoprazole Sodium    unknown   Voltaren [diclofenac Sodium]       Medication List       Accurate as of 07/17/16 11:17 AM. Always use your most recent med list.          acetaminophen 500 MG tablet Commonly known as:  TYLENOL Take 500 mg by mouth. Take one once daily in morning, take 2 at bedtime   acetaminophen 325 MG tablet Commonly known as:  TYLENOL Take 650 mg by mouth every 6 (six) hours as needed for moderate pain or headache.   atorvastatin 10 MG tablet Commonly known as:  LIPITOR Take 10 mg by mouth daily.   carbamide peroxide 6.5 % OTIC solution Commonly known as:  DEBROX 5 drops. Put 5 drops in left ear for 3 nights   doxazosin 8 MG tablet Commonly known as:  CARDURA Take 8 mg by mouth daily. To control BP.   furosemide 40 MG tablet Commonly known as:  LASIX Take 40 mg by mouth. Take one tablet twice a day   HUMALOG 100 UNIT/ML cartridge Generic drug:  insulin lispro Inject 10 Units into the skin. At 1230PM and 530PM   IMODIUM A-D 2 MG  tablet Generic drug:  loperamide Take 2 mg by mouth 4 (four) times daily as needed for diarrhea or loose stools.   insulin glargine 100 UNIT/ML injection Commonly known as:  LANTUS Inject 18 Units into the skin at bedtime.   ipratropium-albuterol 0.5-2.5 (3) MG/3ML Soln Commonly known as:  DUONEB Take 3 mLs by nebulization. Use 1 vial via nebulizer 3 times a day as needed for shortness of breath or wheezing   losartan 50 MG tablet Commonly known as:  COZAAR Take 50 mg by mouth daily.   metoprolol tartrate 25 MG tablet Commonly known as:  LOPRESSOR Take 25 mg  by mouth 2 (two) times daily.   multivitamin with minerals tablet Take 1 tablet by mouth daily.   omeprazole 20 MG capsule Commonly known as:  PRILOSEC Take 20 mg by mouth daily.   OXYGEN Inhale into the lungs. 3 liter via nasal canula   predniSONE 5 MG tablet Commonly known as:  DELTASONE Take 5 mg by mouth daily with breakfast.   ROBITUSSIN DM PO Take by mouth. 10 cc every 6 hours as needed for cough for 48 hours   spironolactone 25 MG tablet Commonly known as:  ALDACTONE Take 12.5 mg by mouth daily.   zolpidem 5 MG tablet Commonly known as:  AMBIEN Take 5 mg by mouth at bedtime.       Review of Systems  Constitutional: Negative for activity change, appetite change, chills, fatigue and fever.  HENT: Positive for hearing loss. Negative for congestion, rhinorrhea, sinus pain, sinus pressure, sneezing and sore throat.   Eyes: Negative.        Wears eye glasses   Respiratory: Positive for shortness of breath and wheezing. Negative for cough and chest tightness.   Cardiovascular: Positive for leg swelling. Negative for chest pain and palpitations.  Gastrointestinal: Negative for abdominal distention, abdominal pain, constipation, diarrhea, nausea and vomiting.  Endocrine: Negative.   Genitourinary: Negative for dysuria, flank pain, frequency and urgency.  Musculoskeletal: Positive for arthralgias and gait  problem.  Skin: Negative for color change, pallor and rash.  Neurological: Negative for dizziness, seizures, syncope and light-headedness.       Occasional headache   Hematological: Does not bruise/bleed easily.  Psychiatric/Behavioral: Negative for agitation, confusion, hallucinations and sleep disturbance. The patient is not nervous/anxious.     Immunization History  Administered Date(s) Administered  . Influenza Split 11/06/2011, 11/04/2012  . Influenza,inj,Quad PF,36+ Mos 11/17/2013  . Influenza-Unspecified 11/09/2014, 11/16/2015  . PPD Test 11/29/2014  . Pneumococcal Conjugate-13 08/11/2014  . Pneumococcal Polysaccharide-23 02/04/2003  . Pneumococcal-Unspecified 06/07/2014  . Tdap 02/27/2013  . Zoster 02/03/2006   Pertinent  Health Maintenance Due  Topic Date Due  . OPHTHALMOLOGY EXAM  [birth year]29  . DEXA SCAN  07/18/2026 (Originally 10/31/1984)  . INFLUENZA VACCINE  09/03/2016  . HEMOGLOBIN A1C  01/09/2017  . FOOT EXAM  02/07/2017  . PNA vac Low Risk Adult  Completed   Fall Risk  12/06/2014 07/11/2014 05/22/2014 11/07/2013 08/30/2013  Falls in the past year? Yes No Yes No No  Number falls in past yr: 1 - 1 - -  Injury with Fall? No - No - -  Risk for fall due to : History of fall(s);Impaired mobility - History of fall(s);Impaired balance/gait - Impaired balance/gait;Impaired mobility  Follow up - - Falls evaluation completed - -    Vitals:   07/17/16 0934  BP: (!) 148/69  Pulse: 85  Resp: 20  Temp: 97.9 F (36.6 C)  SpO2: 96%  Weight: 208 lb (94.3 kg)  Height: 5\' 7"  (1.702 m)   Body mass index is 32.58 kg/m. Physical Exam  Constitutional: She is oriented to person, place, and time. She appears well-developed and well-nourished. No distress.  HENT:  Head: Normocephalic.  Mouth/Throat: Oropharynx is clear and moist. No oropharyngeal exudate.  Hearing Aids in place   Eyes: Conjunctivae and EOM are normal. Pupils are equal, round, and reactive to light. Right eye  exhibits no discharge. Left eye exhibits no discharge. No scleral icterus.  Eye glasses in place   Neck: Normal range of motion. No JVD present. No thyromegaly present.  Cardiovascular:  Normal rate, regular rhythm, normal heart sounds and intact distal pulses.  Exam reveals no gallop and no friction rub.   No murmur heard. Pulmonary/Chest: Effort normal. No respiratory distress. She has no rales.  Bilateral lower lobes diminished expiration wheezes noted  Abdominal: Soft. Bowel sounds are normal. She exhibits no distension. There is no tenderness. There is no rebound and no guarding.  Musculoskeletal: She exhibits no tenderness or deformity.  Moves x 4 extremities. Spends most time on wheelchair. Bilateral lower extremities trace edema.   Lymphadenopathy:    She has no cervical adenopathy.  Neurological: She is oriented to person, place, and time.  Skin: Skin is warm and dry. No rash noted. No erythema. No pallor.  Bilateral lower extremities skin brown pigmentations  Psychiatric: She has a normal mood and affect.    Labs reviewed:  Recent Labs  03/13/16 07/10/16 07/14/16  NA 139 141 139  K 4.2 3.9 4.1  BUN 45* 43* 30*  CREATININE 0.9 0.9 0.9    Recent Labs  11/26/15 03/13/16 07/10/16  AST 18 16 14   ALT 15 11 12   ALKPHOS 94 80 86    Recent Labs  11/26/15 03/13/16 07/10/16  WBC 7.7 7.0 8.1  HGB 10.2* 10.3* 9.8*  HCT 31* 32* 30*  PLT 221 184 168   Lab Results  Component Value Date   TSH 2.61 07/10/2016   Lab Results  Component Value Date   HGBA1C 7.6 07/10/2016   Lab Results  Component Value Date   CHOL 109 07/19/2015   HDL 75 (A) 07/19/2015   LDLCALC 26 07/19/2015   TRIG 39 (A) 07/19/2015    Significant Diagnostic Results in last 30 days:  No results found.  Assessment/Plan 1. Chronic obstructive pulmonary disease, unspecified COPD type  Afebrile. Bilateral expiration wheezes to lower lobes noted on Auscultation.change DuoNeb 0.5-2.5 (3) mg/3 ml inhale 3  mls via Nebulization every 8 hours.continue on oxygen 1 Liter via Nasal cannula. DuoNeb treatment medically necessary for patient's breathing will obtain prior authorization for insurance coverage.   2. Edema  Trace edema. Has improved per patient. Continue on furosemide 40 mg Tablet daily.continue to elevate legs when sitting. Monitor BMP.   3. Type 2 DM  Lab Results  Component Value Date   HGBA1C 7.6 07/10/2016   CBG ranging in the 140's-160's since insulin adjusted by MD. Will continue on Humalog 10 units at 12:30Pm and 5: 30 Pm and Lantus 18 units at bedtime. On Atorvastatin 10 mg Tablet.Foot Exam up to date. Get Opthalmology consult for annual eye exam.    Family/ staff Communication: Reviewed plan of care with patient and facility Nurse supervisor  Labs/tests ordered: None   Sandrea Hughs, NP

## 2016-07-22 DIAGNOSIS — H04123 Dry eye syndrome of bilateral lacrimal glands: Secondary | ICD-10-CM | POA: Diagnosis not present

## 2016-07-22 DIAGNOSIS — H353131 Nonexudative age-related macular degeneration, bilateral, early dry stage: Secondary | ICD-10-CM | POA: Diagnosis not present

## 2016-07-22 DIAGNOSIS — Z961 Presence of intraocular lens: Secondary | ICD-10-CM | POA: Diagnosis not present

## 2016-07-22 LAB — HM DIABETES EYE EXAM

## 2016-07-28 ENCOUNTER — Non-Acute Institutional Stay (SKILLED_NURSING_FACILITY): Payer: Medicare Other | Admitting: Internal Medicine

## 2016-07-28 ENCOUNTER — Encounter: Payer: Self-pay | Admitting: Internal Medicine

## 2016-07-28 DIAGNOSIS — M542 Cervicalgia: Secondary | ICD-10-CM

## 2016-07-28 DIAGNOSIS — J441 Chronic obstructive pulmonary disease with (acute) exacerbation: Secondary | ICD-10-CM

## 2016-07-28 NOTE — Progress Notes (Signed)
Location:  [facility] Room Number: N- 16  Place of Service:  SNF ([phone]) Provider:    Blanchie Serve, MD  Patient Care Team: Blanchie Serve, MD as PCP - General (Internal Medicine) Clent Jacks, MD (Ophthalmology) Adrian Prows, MD as Attending Physician (Cardiology) [place], [facility] [name], MD as Consulting Physician (Orthopedic Surgery) Clance, Armando Reichert, MD as Consulting Physician (Pulmonary Disease) Ngetich, Nelda Bucks, NP as Nurse Practitioner [facility] Medicine)  Extended Emergency Contact Information Primary Emergency Contact: Ige,[name] Address: [address]          [place], [place] [address] [name] of [place] Phone: [phone] Mobile Phone: [phone] Relation: Son Secondary Emergency Contact: [name] [place] of [place] Mobile Phone: [phone] Relation: Daughter  Code Status:  DNR  Goals of care: Advanced Directive information Advanced Directives 07/17/2016  Does Patient Have a Medical Advance Directive? Yes  Type of Advance Directive Out of facility DNR (pink MOST or yellow form);Living will;Healthcare Power of Attorney  Does patient want to make changes to medical advance directive? -  Copy of [place] in Chart? Yes  Pre-existing out of facility DNR order (yellow form or pink MOST form) Yellow form placed in chart (order not valid for inpatient use)     Chief Complaint  Patient presents with  . Acute Visit    ongoing coughing and wheezing    HPI:  Pt is a 83 y.o. female seen today for an acute visit for increased cough and wheezing. She has history of COPD and is on chronic oxygen and breathing treatment. She is also on chronic prednisone. She is seen in her room today. She complaints of neck pain x 1 week to right back side of her neck. Denies any known injury.  She has been having increased cough with increased amount of phlegm for 1 week. Denies hemoptysis. She feels congested in  her chest. She has noticed wheezing with minimal exertional and at night. She denies any fever.   Past Medical History:  Diagnosis Date  . Abnormality of gait   . Anemia, unspecified   . Arthritis   . Bronchiectasis without acute exacerbation ([place]) 04/10/2011   CT chest 2011   . Cataract 1990   Dr. Katy Fitch  . Cholelithiases   . Closed fracture of unspecified part of ramus of mandible   . COPD (chronic obstructive pulmonary disease) ([place])   . Coronary atherosclerosis of unspecified type of vessel, native or graft   . Disturbance of skin sensation   . DOE (dyspnea on exertion) 04/10/2011   PFTs 2013:  FEV1 1.04 (78%), ratio 64, couldn't do maneuver for lung volumes or dlco   . Edema   . Esophageal reflux   . Fall from other slipping, tripping, or stumbling   . Gastric ulcer with hemorrhage 2008  . Hypertension   . Hypoxemia   . Insomnia, unspecified   . Mucopurulent chronic bronchitis ([place])   . Nodule of neck 11/07/2013   Left neck posteriorly   . Nontoxic uninodular goiter   . Osteoarthrosis, unspecified whether generalized or localized, unspecified site   . Other and unspecified hyperlipidemia   . Other malaise and fatigue   . Pain in joint, lower leg 2010   right knee  . Pain in joint, shoulder region 2013   left  . Palpitations   . Pneumonia, organism unspecified(486)   . Regional enteritis of unspecified site   . Rosacea   . Sciatica   .  Squamous cell carcinoma of skin of lower limb, including hip 07/27/2012   Nonhealing lesion of the left mid calf medially   . Tachycardia 12/06/2014  . Tension headache   . Tension headache   . Type II or unspecified type diabetes mellitus without mention of complication, not stated as uncontrolled   . Unspecified venous (peripheral) insufficiency   . Urine, incontinence, stress female 07/19/2013   Past Surgical History:  Procedure Laterality Date  . EYE SURGERY Bilateral 1990   Dr. Katy Fitch  . GASTROJEJUNOSTOMY  05/2004   secondary to  ulcer  . JOINT REPLACEMENT Bilateral 1993-1998   total knee replacement  . MOLE REMOVAL  05/11/14   left hand and left side of face [facility]  . OTHER SURGICAL HISTORY  2008   Ulcer surgery   . SMALL INTESTINE SURGERY    . TOTAL KNEE ARTHROPLASTY Bilateral 1993, 1998   knees    Allergies  Allergen Reactions  . Adhesive [Tape]     unknown  . Aspirin     unknown  . Augmentin [Amoxicillin-Pot Clavulanate]   . Codeine     unknown  . Diclofenac   . Enalapril Cough  . Hydrocodone     unknown  . Pantoprazole Sodium     unknown  . Voltaren [Diclofenac Sodium]     Outpatient Encounter Prescriptions as of 07/28/2016  Medication Sig  . acetaminophen (TYLENOL) 325 MG tablet Take 650 mg by mouth every 6 (six) hours as needed for moderate pain or headache.  Marland Kitchen acetaminophen (TYLENOL) 500 MG tablet Take 500 mg by mouth. Take one once daily in morning, take 2 at bedtime  . atorvastatin (LIPITOR) 10 MG tablet Take 10 mg by mouth at bedtime.   . carbamide peroxide (DEBROX) 6.5 % otic solution Place 5 drops into the left ear at bedtime. On the 12th, 13th and 14th of the month  . Dextromethorphan-Guaifenesin (ROBITUSSIN DM PO) Take by mouth. 10 cc every 6 hours as needed for cough for 48 hours  . doxazosin (CARDURA) 8 MG tablet Take 8 mg by mouth daily. To control BP.  . furosemide (LASIX) 40 MG tablet Take 40 mg by mouth daily.   . insulin glargine (LANTUS) 100 UNIT/ML injection Inject 18 Units into the skin at bedtime.   . insulin lispro (HUMALOG) 100 UNIT/ML cartridge Inject 10 Units into the skin. At 1230 PM and 530 PM. (If blood sugar is 150-250 = 8 units, if blood sugar is greater than 250 = 12 units daily)  . ipratropium-albuterol (DUONEB) 0.5-2.5 (3) MG/3ML SOLN Take 3 mLs by nebulization 2 (two) times daily.  Marland Kitchen loperamide (IMODIUM A-D) 2 MG tablet Take 2 mg by mouth as needed for diarrhea or loose stools.   Marland Kitchen loratadine (CLARITIN) 10 MG tablet Take 10 mg by mouth daily as needed  for allergies.  Marland Kitchen losartan (COZAAR) 50 MG tablet Take 50 mg by mouth daily.  . metoprolol tartrate (LOPRESSOR) 25 MG tablet Take 25 mg by mouth 2 (two) times daily.  . Multiple Vitamins-Minerals (MULTIVITAMIN WITH MINERALS) tablet Take 1 tablet by mouth daily.  Marland Kitchen omeprazole (PRILOSEC) 20 MG capsule Take 20 mg by mouth daily.   . OXYGEN Inhale into the lungs. 3 liter via nasal canula  . predniSONE (DELTASONE) 5 MG tablet Take 5 mg by mouth daily with breakfast.  . spironolactone (ALDACTONE) 25 MG tablet Take 12.5 mg by mouth daily.   . traZODone (DESYREL) 50 MG tablet Take 25 mg by mouth at  bedtime as needed for sleep.  . [DISCONTINUED] ipratropium-albuterol (DUONEB) 0.5-2.5 (3) MG/3ML SOLN Take 3 mLs by nebulization every 8 (eight) hours. Use 1 vial via nebulizer 3 times a day as needed for shortness of breath or wheezing  . [DISCONTINUED] zolpidem (AMBIEN) 5 MG tablet Take 5 mg by mouth at bedtime.    No facility-administered encounter medications on file as of 07/28/2016.     Review of Systems  Constitutional: Negative for appetite change, chills, diaphoresis and fever.  HENT: Positive for congestion. Negative for ear discharge, ear pain, mouth sores, rhinorrhea, sinus pain and sinus pressure.        Has left ear stuffiness  Respiratory: Positive for cough, shortness of breath and wheezing. Negative for chest tightness.        On oxygen by nasal canula, has shortness of breath with exertion, denies dyspnea at rest  Cardiovascular: Negative for chest pain and palpitations.       Has chronic leg edema but swelling has decreased  Gastrointestinal: Negative for abdominal pain, diarrhea, nausea and vomiting.  Endocrine: Positive for polyuria.  Genitourinary: Negative for dysuria.  Musculoskeletal: Positive for neck pain and neck stiffness. Negative for arthralgias.  Skin: Negative for rash.  Neurological: Negative for dizziness and headaches.  Psychiatric/Behavioral: Negative for behavioral  problems.    Immunization History  Administered Date(s) Administered  . Influenza Split 11/06/2011, 11/04/2012  . Influenza,inj,Quad PF,36+ Mos 11/17/2013  . Influenza-Unspecified 11/09/2014, 11/16/2015  . PPD Test 11/29/2014  . Pneumococcal Conjugate-13 08/11/2014  . Pneumococcal Polysaccharide-23 02/04/2003  . Pneumococcal-Unspecified 06/07/2014  . Tdap 02/27/2013  . Zoster 02/03/2006   Pertinent  Health Maintenance Due  Topic Date Due  . OPHTHALMOLOGY EXAM  10/31/1929  . DEXA SCAN  07/18/2026 (Originally 10/31/1984)  . INFLUENZA VACCINE  09/03/2016  . HEMOGLOBIN A1C  01/09/2017  . FOOT EXAM  02/07/2017  . PNA vac Low Risk Adult  Completed   Fall Risk  12/06/2014 07/11/2014 05/22/2014 11/07/2013 08/30/2013  Falls in the past year? Yes No Yes No No  Number falls in past yr: 1 - 1 - -  Injury with Fall? No - No - -  Risk for fall due to : History of fall(s);Impaired mobility - History of fall(s);Impaired balance/gait - Impaired balance/gait;Impaired mobility  Follow up - - Falls evaluation completed - -   Functional Status Survey:    Vitals:   07/28/16 1127  BP: (!) 139/55  Pulse: 98  Resp: 20  Temp: 97.7 F (36.5 C)  TempSrc: Oral  SpO2: 95%  Weight: 195 lb (88.5 kg)  Height: 5\' 4"  (1.626 m)   Body mass index is 33.47 kg/m. Physical Exam  Constitutional: She is oriented to person, place, and time. She appears well-developed. No distress.  obese  HENT:  Head: Normocephalic and atraumatic.  Right Ear: External ear normal.  Left Ear: External ear normal.  Mouth/Throat: Oropharynx is clear and moist. No oropharyngeal exudate.  Otoscopic exam shows minimal wax in both ear canal, can visualize tympanic membrane. No sinus tenderness  Eyes: Pupils are equal, round, and reactive to light.  Neck: Normal range of motion. Neck supple.  Right posterior neck area has tenderness to palpation, no cervical spine tenderness  Cardiovascular: Normal rate and regular rhythm.     Pulmonary/Chest: Effort normal. She has wheezes. She has no rales.  Poor air entry bilaterally  Abdominal: Soft. Bowel sounds are normal.  Musculoskeletal: She exhibits edema.  On wheelchair and has trace leg edema. Chronic skin changes to  lower legs  Lymphadenopathy:    She has no cervical adenopathy.  Neurological: She is alert and oriented to person, place, and time.  Skin: Skin is warm and dry. She is not diaphoretic.  Psychiatric: She has a normal mood and affect. Her behavior is normal.    Labs reviewed:  Recent Labs  03/13/16 07/10/16 07/14/16  NA 139 141 139  K 4.2 3.9 4.1  BUN 45* 43* 30*  CREATININE 0.9 0.9 0.9    Recent Labs  11/26/15 03/13/16 07/10/16  AST 18 16 14   ALT 15 11 12   ALKPHOS 94 80 86    Recent Labs  11/26/15 03/13/16 07/10/16  WBC 7.7 7.0 8.1  HGB 10.2* 10.3* 9.8*  HCT 31* 32* 30*  PLT 221 184 168   Lab Results  Component Value Date   TSH 2.61 07/10/2016   Lab Results  Component Value Date   HGBA1C 7.6 07/10/2016   Lab Results  Component Value Date   CHOL 109 07/19/2015   HDL 75 (A) 07/19/2015   LDLCALC 26 07/19/2015   TRIG 39 (A) 07/19/2015    Significant Diagnostic Results in last 30 days:  No results found.  Assessment/Plan  Copd exacerbation With increased cough, wheezing and worsening dyspnea with exertion. Currently on o2 by nasal canula. Start her on prednisone 40 mg po daily x 5 days followed by 5 mg daily. Add duoneb tid x 1 week, then q8h prn. Change robitussin to tid x 1 week and then prn. If no improvement, will get chest xray to rule out bronchitis vs pneumonia.   Right neck pain Pain to posterior neck area, appears musculoskeletal. Currently on tylenol 500 mg am and 1000 mg bedtime. Add biofreeze gel bid to affected area x 1 week and monitor.    Family/ staff Communication: reviewed care plan with patient and charge nurse  Labs/tests ordered:  None  Blanchie Serve, MD Internal Medicine [place], [place] [address] Cell Phone (Monday-Friday 8 am - 5 pm): [phone] On Call: [phone] and follow prompts after 5 pm and on weekends Office Phone: [phone] Office Fax: [phone]

## 2016-08-07 ENCOUNTER — Non-Acute Institutional Stay (SKILLED_NURSING_FACILITY): Payer: Medicare Other | Admitting: Family

## 2016-08-07 ENCOUNTER — Encounter: Payer: Self-pay | Admitting: Family

## 2016-08-07 DIAGNOSIS — G4700 Insomnia, unspecified: Secondary | ICD-10-CM

## 2016-08-07 MED ORDER — TRAZODONE HCL 50 MG PO TABS: 25.0000 mg | ORAL_TABLET | Freq: Every day | ORAL | Status: DC

## 2016-08-07 NOTE — Progress Notes (Signed)
Location:  [facility] Room Number: 16 Place of Service:  SNF [phone]) Provider: Dinah Ngetich FNP-C  Blanchie Serve, MD  Patient Care Team: Blanchie Serve, MD as PCP - General (Internal Medicine) Clent Jacks, MD (Ophthalmology) Adrian Prows, MD as Attending Physician (Cardiology) [place], [facility] [name], MD as Consulting Physician (Orthopedic Surgery) Clance, Armando Reichert, MD as Consulting Physician (Pulmonary Disease) Ngetich, Nelda Bucks, NP as Nurse Practitioner [facility] Medicine)  Extended Emergency Contact Information Primary Emergency Contact: Leasure,[name] Address: [place]          [place], [place] [address] [name] of [place] Phone: [phone] Mobile Phone: [phone] Relation: Son Secondary Emergency Contact: [name] [place] of [place] Mobile Phone: [phone] Relation: Daughter  Code Status:  DNR  Goals of care: Advanced Directive information Advanced Directives 08/07/2016  Does Patient Have a Medical Advance Directive? Yes  Type of Advance Directive Out of facility DNR (pink MOST or yellow form);Living will;Healthcare Power of Attorney  Does patient want to make changes to medical advance directive? -  Copy of [place] in Chart? Yes  Pre-existing out of facility DNR order (yellow form or pink MOST form) Yellow form placed in chart (order not valid for inpatient use)     Chief Complaint  Patient presents with  . Acute Visit    Medication management    HPI:  Pt is a 83 y.o. female seen today at [facility] for an acute visit for evaluation of insomnia. She is seen in her room today per facility Nurse request. Nurse reports patient currently on Trazodone 25 mg Tablet as needed but has been requesting for medication daily at bedtime. Request medication to be switched to schedule instead of PRN. Patient's states current dose effective for sleep.she denies any fever, chills or worsening cough.     Past Medical History:  Diagnosis Date  . Abnormality of gait   . Anemia, unspecified   . Arthritis   . Bronchiectasis without acute exacerbation ([place]) 04/10/2011   CT chest 2011   . Cataract 1990   Dr. Katy Fitch  . Cholelithiases   . Closed fracture of unspecified part of ramus of mandible   . COPD (chronic obstructive pulmonary disease) ([place])   . Coronary atherosclerosis of unspecified type of vessel, native or graft   . Disturbance of skin sensation   . DOE (dyspnea on exertion) 04/10/2011   PFTs 2013:  FEV1 1.04 (78%), ratio 64, couldn't do maneuver for lung volumes or dlco   . Edema   . Esophageal reflux   . Fall from other slipping, tripping, or stumbling   . Gastric ulcer with hemorrhage 2008  . Hypertension   . Hypoxemia   . Insomnia, unspecified   . Mucopurulent chronic bronchitis ([place])   . Nodule of neck 11/07/2013   Left neck posteriorly   . Nontoxic uninodular goiter   . Osteoarthrosis, unspecified whether generalized or localized, unspecified site   . Other and unspecified hyperlipidemia   . Other malaise and fatigue   . Pain in joint, lower leg 2010   right knee  . Pain in joint, shoulder region 2013   left  . Palpitations   . Pneumonia, organism unspecified(486)   . Regional enteritis of unspecified site   . Rosacea   . Sciatica   . Squamous cell carcinoma of skin of lower limb, including hip 07/27/2012   Nonhealing lesion of the left mid calf medially   . Tachycardia  12/06/2014  . Tension headache   . Tension headache   . Type II or unspecified type diabetes mellitus without mention of complication, not stated as uncontrolled   . Unspecified venous (peripheral) insufficiency   . Urine, incontinence, stress female 07/19/2013   Past Surgical History:  Procedure Laterality Date  . EYE SURGERY Bilateral 1990   Dr. Katy Fitch  . GASTROJEJUNOSTOMY  05/2004   secondary to ulcer  . JOINT REPLACEMENT Bilateral 1993-1998   total knee replacement  . MOLE REMOVAL   05/11/14   left hand and left side of face [facility]  . OTHER SURGICAL HISTORY  2008   Ulcer surgery   . SMALL INTESTINE SURGERY    . TOTAL KNEE ARTHROPLASTY Bilateral 1993, 1998   knees    Allergies  Allergen Reactions  . Adhesive [Tape]     unknown  . Aspirin     unknown  . Augmentin [Amoxicillin-Pot Clavulanate]   . Codeine     unknown  . Diclofenac   . Enalapril Cough  . Hydrocodone     unknown  . Pantoprazole Sodium     unknown  . Voltaren [Diclofenac Sodium]     Allergies as of 08/07/2016      Reactions   Adhesive [tape]    unknown   Aspirin    unknown   Augmentin [amoxicillin-pot Clavulanate]    Codeine    unknown   Diclofenac    Enalapril Cough   Hydrocodone    unknown   Pantoprazole Sodium    unknown   Voltaren [diclofenac Sodium]       Medication List       Accurate as of 08/07/16  1:55 PM. Always use your most recent med list.          acetaminophen 500 MG tablet Commonly known as:  TYLENOL Take 500 mg by mouth. Take one once daily in morning, take 2 at bedtime   acetaminophen 325 MG tablet Commonly known as:  TYLENOL Take 650 mg by mouth every 6 (six) hours as needed for moderate pain or headache.   atorvastatin 10 MG tablet Commonly known as:  LIPITOR Take 10 mg by mouth at bedtime.   carbamide peroxide 6.5 % OTIC solution Commonly known as:  DEBROX Place 5 drops into the left ear at bedtime. On the 12th, 13th and 14th of the month   doxazosin 8 MG tablet Commonly known as:  CARDURA Take 8 mg by mouth daily. To control BP.   furosemide 40 MG tablet Commonly known as:  LASIX Take 40 mg by mouth daily.   HUMALOG 100 UNIT/ML cartridge Generic drug:  insulin lispro Inject 10 Units into the skin. At 1230 PM and 530 PM. (If blood sugar is 150-250 = 8 units, if blood sugar is greater than 250 = 12 units daily)   IMODIUM A-D 2 MG tablet Generic drug:  loperamide Take 2 mg by mouth as needed for diarrhea or loose stools.     insulin glargine 100 UNIT/ML injection Commonly known as:  LANTUS Inject 18 Units into the skin at bedtime.   ipratropium-albuterol 0.5-2.5 (3) MG/3ML Soln Commonly known as:  DUONEB Take 3 mLs by nebulization every 6 (six) hours as needed.   loratadine 10 MG tablet Commonly known as:  CLARITIN Take 10 mg by mouth daily as needed for allergies.   losartan 50 MG tablet Commonly known as:  COZAAR Take 50 mg by mouth daily.   metoprolol tartrate 25 MG tablet Commonly  known as:  LOPRESSOR Take 25 mg by mouth 2 (two) times daily.   multivitamin with minerals tablet Take 1 tablet by mouth daily.   omeprazole 20 MG capsule Commonly known as:  PRILOSEC Take 20 mg by mouth daily.   OXYGEN Inhale into the lungs. 3 liter via nasal canula   predniSONE 5 MG tablet Commonly known as:  DELTASONE Take 5 mg by mouth daily with breakfast.   ROBITUSSIN DM PO Take by mouth. 10 cc every 6 hours as needed for cough for 48 hours   spironolactone 25 MG tablet Commonly known as:  ALDACTONE Take 12.5 mg by mouth daily.   traZODone 50 MG tablet Commonly known as:  DESYREL Take 0.5 tablets (25 mg total) by mouth at bedtime.       Review of Systems  Constitutional: Negative for activity change, appetite change, chills, fatigue and fever.  HENT: Positive for hearing loss. Negative for congestion, rhinorrhea, sinus pain, sinus pressure, sneezing and sore throat.   Eyes: Negative for pain, discharge, redness and itching.       Wears eye glasses   Respiratory: Negative for cough, chest tightness, shortness of breath and wheezing.   Cardiovascular: Positive for leg swelling. Negative for chest pain and palpitations.  Gastrointestinal: Negative for abdominal distention, abdominal pain, constipation, diarrhea, nausea and vomiting.  Genitourinary: Negative for dysuria, flank pain, frequency and urgency.  Musculoskeletal: Positive for arthralgias and gait problem.  Skin: Negative for color  change, pallor and rash.  Neurological: Negative for dizziness, seizures, syncope and light-headedness.  Psychiatric/Behavioral: Positive for sleep disturbance. Negative for agitation, confusion and hallucinations. The patient is not nervous/anxious.     Immunization History  Administered Date(s) Administered  . Influenza Split 11/06/2011, 11/04/2012  . Influenza,inj,Quad PF,36+ Mos 11/17/2013  . Influenza-Unspecified 11/09/2014, 11/16/2015  . PPD Test 11/29/2014  . Pneumococcal Conjugate-13 08/11/2014  . Pneumococcal Polysaccharide-23 02/04/2003  . Pneumococcal-Unspecified 06/07/2014  . Tdap 02/27/2013  . Zoster 02/03/2006   Pertinent  Health Maintenance Due  Topic Date Due  . DEXA SCAN  07/18/2026 (Originally 10/31/1984)  . INFLUENZA VACCINE  09/03/2016  . HEMOGLOBIN A1C  01/09/2017  . FOOT EXAM  02/07/2017  . OPHTHALMOLOGY EXAM  07/22/2017  . PNA vac Low Risk Adult  Completed   Fall Risk  12/06/2014 07/11/2014 05/22/2014 11/07/2013 08/30/2013  Falls in the past year? Yes No Yes No No  Number falls in past yr: 1 - 1 - -  Injury with Fall? No - No - -  Risk for fall due to : History of fall(s);Impaired mobility - History of fall(s);Impaired balance/gait - Impaired balance/gait;Impaired mobility  Follow up - - Falls evaluation completed - -    Vitals:   08/07/16 1004  BP: 140/86  Pulse: 83  Resp: 20  Temp: 98.2 F (36.8 C)  TempSrc: Oral  SpO2: 93%  Weight: 210 lb (95.3 kg)  Height: 5\' 4"  (1.626 m)   Body mass index is 36.05 kg/m. Physical Exam  Constitutional: She is oriented to person, place, and time. She appears well-developed and well-nourished. No distress.  HENT:  Head: Normocephalic.  Mouth/Throat: Oropharynx is clear and moist. No oropharyngeal exudate.  Hearing Aids in place   Eyes: Conjunctivae and EOM are normal. Pupils are equal, round, and reactive to light. Right eye exhibits no discharge. Left eye exhibits no discharge. No scleral icterus.  Eye glasses  in place   Neck: Normal range of motion. No JVD present. No thyromegaly present.  Cardiovascular: Normal rate, regular  rhythm, normal heart sounds and intact distal pulses.  Exam reveals no gallop and no friction rub.   No murmur heard. Pulmonary/Chest: Effort normal and breath sounds normal. No respiratory distress. She has no wheezes. She has no rales.  Abdominal: Soft. Bowel sounds are normal. She exhibits no distension. There is no tenderness. There is no rebound and no guarding.  Musculoskeletal: She exhibits no tenderness or deformity.  Unsteady gait uses wheelchair. Bilateral lower extremities trace-1+ edema.   Lymphadenopathy:    She has no cervical adenopathy.  Neurological: She is oriented to person, place, and time.  Skin: Skin is warm and dry. No rash noted. No erythema. No pallor.  Psychiatric: She has a normal mood and affect.    Labs reviewed:  Recent Labs  03/13/16 07/10/16 07/14/16  NA 139 141 139  K 4.2 3.9 4.1  BUN 45* 43* 30*  CREATININE 0.9 0.9 0.9    Recent Labs  11/26/15 03/13/16 07/10/16  AST 18 16 14   ALT 15 11 12   ALKPHOS 94 80 86    Recent Labs  11/26/15 03/13/16 07/10/16  WBC 7.7 7.0 8.1  HGB 10.2* 10.3* 9.8*  HCT 31* 32* 30*  PLT 221 184 168   Lab Results  Component Value Date   TSH 2.61 07/10/2016   Lab Results  Component Value Date   HGBA1C 7.6 07/10/2016   Lab Results  Component Value Date   CHOL 109 07/19/2015   HDL 75 (A) 07/19/2015   LDLCALC 26 07/19/2015   TRIG 39 (A) 07/19/2015    Significant Diagnostic Results in last 30 days:  No results found.  Assessment/Plan  Insomnia, unspecified type Current dose effective but has required her PRN trazodone daily at bedtime. Will change Trazodone 50 mg Tablet to take 1/2 Tablet ( 25 mg Tablet ) by mouth daily at bedtime. Continue to bedtime sleep hygiene. Continue to monitor.   Family/ staff Communication: Reviewed plan of care with patient and facility Nurse  Labs/tests  ordered: None   Nelda Bucks Ngetich, NP

## 2016-08-11 ENCOUNTER — Encounter: Payer: Self-pay | Admitting: Internal Medicine

## 2016-08-11 ENCOUNTER — Non-Acute Institutional Stay (SKILLED_NURSING_FACILITY): Payer: Medicare Other | Admitting: Internal Medicine

## 2016-08-11 DIAGNOSIS — E1122 Type 2 diabetes mellitus with diabetic chronic kidney disease: Secondary | ICD-10-CM

## 2016-08-11 DIAGNOSIS — N3281 Overactive bladder: Secondary | ICD-10-CM

## 2016-08-11 DIAGNOSIS — Z7952 Long term (current) use of systemic steroids: Secondary | ICD-10-CM | POA: Diagnosis not present

## 2016-08-11 DIAGNOSIS — Z794 Long term (current) use of insulin: Secondary | ICD-10-CM | POA: Diagnosis not present

## 2016-08-11 DIAGNOSIS — J449 Chronic obstructive pulmonary disease, unspecified: Secondary | ICD-10-CM

## 2016-08-11 DIAGNOSIS — I5023 Acute on chronic systolic (congestive) heart failure: Secondary | ICD-10-CM | POA: Diagnosis not present

## 2016-08-11 DIAGNOSIS — N183 Chronic kidney disease, stage 3 (moderate): Secondary | ICD-10-CM | POA: Diagnosis not present

## 2016-08-11 NOTE — Progress Notes (Signed)
Location:  [place] Room Number: 16 Place of Service:  SNF [phone]) Provider:  Blanchie Serve MD  Blanchie Serve, MD  Patient Care Team: Blanchie Serve, MD as PCP - General (Internal Medicine) Clent Jacks, MD (Ophthalmology) Adrian Prows, MD as Attending Physician (Cardiology) [place], [facility] [name], MD as Consulting Physician (Orthopedic Surgery) Clance, Armando Reichert, MD as Consulting Physician (Pulmonary Disease) Ngetich, Nelda Bucks, NP as Nurse Practitioner [facility] Medicine)  Extended Emergency Contact Information Primary Emergency Contact: Chachere,[name] Address: [address]          [place], [place] [address] [name] of [place] Phone: [phone] Mobile Phone: [phone] Relation: Son Secondary Emergency Contact: [name] [place] of [place] Mobile Phone: [phone] Relation: Daughter  Code Status:  DNR Goals of care: Advanced Directive information Advanced Directives 08/07/2016  Does Patient Have a Medical Advance Directive? Yes  Type of Advance Directive Out of facility DNR (pink MOST or yellow form);Living will;Healthcare Power of Attorney  Does patient want to make changes to medical advance directive? -  Copy of [place] in Chart? Yes  Pre-existing out of facility DNR order (yellow form or pink MOST form) Yellow form placed in chart (order not valid for inpatient use)     Chief Complaint  Patient presents with  . Medical Management of Chronic Issues    Routine Visit     HPI:  Pt is a 83 y.o. female seen today for medical management of chronic diseases. She was recently treated for copd exacerbation. Her trazodone dosing was recently adjusted with her insomnia. She is seen in her room today. She complaints of increased urinary frequency at night time interrupting her sleep. Her cough and breathing has improved. She is on oxygen. She has been taking her medications.    Past Medical History:    Diagnosis Date  . Abnormality of gait   . Anemia, unspecified   . Arthritis   . Bronchiectasis without acute exacerbation ([place]) 04/10/2011   CT chest 2011   . Cataract 1990   Dr. Katy Fitch  . Cholelithiases   . Closed fracture of unspecified part of ramus of mandible   . COPD (chronic obstructive pulmonary disease) ([place])   . Coronary atherosclerosis of unspecified type of vessel, native or graft   . Disturbance of skin sensation   . DOE (dyspnea on exertion) 04/10/2011   PFTs 2013:  FEV1 1.04 (78%), ratio 64, couldn't do maneuver for lung volumes or dlco   . Edema   . Esophageal reflux   . Fall from other slipping, tripping, or stumbling   . Gastric ulcer with hemorrhage 2008  . Hypertension   . Hypoxemia   . Insomnia, unspecified   . Mucopurulent chronic bronchitis ([place])   . Nodule of neck 11/07/2013   Left neck posteriorly   . Nontoxic uninodular goiter   . Osteoarthrosis, unspecified whether generalized or localized, unspecified site   . Other and unspecified hyperlipidemia   . Other malaise and fatigue   . Pain in joint, lower leg 2010   right knee  . Pain in joint, shoulder region 2013   left  . Palpitations   . Pneumonia, organism unspecified(486)   . Regional enteritis of unspecified site   . Rosacea   . Sciatica   . Squamous cell carcinoma of skin of lower limb, including hip 07/27/2012   Nonhealing lesion of the left mid calf medially   . Tachycardia 12/06/2014  . Tension headache   .  Tension headache   . Type II or unspecified type diabetes mellitus without mention of complication, not stated as uncontrolled   . Unspecified venous (peripheral) insufficiency   . Urine, incontinence, stress female 07/19/2013   Past Surgical History:  Procedure Laterality Date  . EYE SURGERY Bilateral 1990   Dr. Katy Fitch  . GASTROJEJUNOSTOMY  05/2004   secondary to ulcer  . JOINT REPLACEMENT Bilateral 1993-1998   total knee replacement  . MOLE REMOVAL  05/11/14   left hand and left  side of face [facility]  . OTHER SURGICAL HISTORY  2008   Ulcer surgery   . SMALL INTESTINE SURGERY    . TOTAL KNEE ARTHROPLASTY Bilateral 1993, 1998   knees    Allergies  Allergen Reactions  . Adhesive [Tape]     unknown  . Aspirin     unknown  . Augmentin [Amoxicillin-Pot Clavulanate]   . Codeine     unknown  . Diclofenac   . Enalapril Cough  . Hydrocodone     unknown  . Pantoprazole Sodium     unknown  . Voltaren [Diclofenac Sodium]     Outpatient Encounter Prescriptions as of 08/11/2016  Medication Sig  . acetaminophen (TYLENOL) 325 MG tablet Take 650 mg by mouth every 6 (six) hours as needed for moderate pain or headache.  Marland Kitchen acetaminophen (TYLENOL) 500 MG tablet Take 500 mg by mouth. Take one once daily in morning, take 2 at bedtime  . atorvastatin (LIPITOR) 10 MG tablet Take 10 mg by mouth at bedtime.   . carbamide peroxide (DEBROX) 6.5 % otic solution Place 5 drops into the left ear at bedtime. On the 12th, 13th and 14th of the month  . doxazosin (CARDURA) 8 MG tablet Take 8 mg by mouth daily. To control BP.  . furosemide (LASIX) 40 MG tablet Take 40 mg by mouth daily.   . insulin glargine (LANTUS) 100 UNIT/ML injection Inject 18 Units into the skin at bedtime.   . insulin lispro (HUMALOG) 100 UNIT/ML cartridge Inject 10 Units into the skin. At 1230 PM and 530 PM. (If blood sugar is 150-250 = 8 units, if blood sugar is greater than 250 = 12 units daily)  . ipratropium-albuterol (DUONEB) 0.5-2.5 (3) MG/3ML SOLN Take 3 mLs by nebulization every 6 (six) hours as needed.   . loperamide (IMODIUM A-D) 2 MG tablet Take 4 mg by mouth as needed for diarrhea or loose stools. Give 4 mg as the initial dose. Then give 2 mg after each loose stool  . loratadine (CLARITIN) 10 MG tablet Take 10 mg by mouth daily as needed for allergies.  Marland Kitchen losartan (COZAAR) 50 MG tablet Take 50 mg by mouth daily.  . metoprolol tartrate (LOPRESSOR) 25 MG tablet Take 25 mg by mouth 2 (two) times  daily.  . Multiple Vitamins-Minerals (MULTIVITAMIN WITH MINERALS) tablet Take 1 tablet by mouth daily.  Marland Kitchen omeprazole (PRILOSEC) 20 MG capsule Take 20 mg by mouth daily.   . OXYGEN Inhale into the lungs. 3 liter via nasal canula  . predniSONE (DELTASONE) 5 MG tablet Take 5 mg by mouth daily with breakfast.  . spironolactone (ALDACTONE) 25 MG tablet Take 12.5 mg by mouth daily.   . traZODone (DESYREL) 50 MG tablet Take 0.5 tablets (25 mg total) by mouth at bedtime.  . [DISCONTINUED] Dextromethorphan-Guaifenesin (ROBITUSSIN DM PO) Take by mouth. 10 cc every 6 hours as needed for cough for 48 hours   No facility-administered encounter medications on file as of 08/11/2016.  Review of Systems  Constitutional: Negative for activity change, appetite change, chills and fever.  HENT: Negative for congestion, mouth sores, postnasal drip, sore throat and trouble swallowing.   Eyes:       Wears glasses  Respiratory: Positive for cough, shortness of breath and wheezing.   Cardiovascular: Positive for leg swelling. Negative for chest pain and palpitations.  Gastrointestinal: Negative for abdominal pain, constipation, diarrhea, nausea and vomiting.  Genitourinary: Positive for frequency. Negative for dysuria.  Musculoskeletal: Positive for back pain and gait problem.       Uses walker for short distance and uses her motorized scooter otherwise. No fall reported.   Skin: Negative for rash.  Neurological: Negative for dizziness, seizures and headaches.  Hematological: Bruises/bleeds easily.  Psychiatric/Behavioral: Negative for behavioral problems.    Immunization History  Administered Date(s) Administered  . Influenza Split 11/06/2011, 11/04/2012  . Influenza,inj,Quad PF,36+ Mos 11/17/2013  . Influenza-Unspecified 11/09/2014, 11/16/2015  . PPD Test 11/29/2014  . Pneumococcal Conjugate-13 08/11/2014  . Pneumococcal Polysaccharide-23 02/04/2003  . Pneumococcal-Unspecified 06/07/2014  . Tdap  02/27/2013  . Zoster 02/03/2006   Pertinent  Health Maintenance Due  Topic Date Due  . DEXA SCAN  07/18/2026 (Originally 10/31/1984)  . INFLUENZA VACCINE  09/03/2016  . HEMOGLOBIN A1C  01/09/2017  . FOOT EXAM  02/07/2017  . OPHTHALMOLOGY EXAM  07/22/2017  . PNA vac Low Risk Adult  Completed   Fall Risk  12/06/2014 07/11/2014 05/22/2014 11/07/2013 08/30/2013  Falls in the past year? Yes No Yes No No  Number falls in past yr: 1 - 1 - -  Injury with Fall? No - No - -  Risk for fall due to : History of fall(s);Impaired mobility - History of fall(s);Impaired balance/gait - Impaired balance/gait;Impaired mobility  Follow up - - Falls evaluation completed - -   Functional Status Survey:    Vitals:   08/11/16 1510  BP: 140/86  Pulse: 83  Resp: 20  Temp: 98.2 F (36.8 C)  TempSrc: Oral  SpO2: 93%  Weight: 210 lb (95.3 kg)  Height: 5\' 4"  (1.626 m)   Body mass index is 36.05 kg/m. Physical Exam  Constitutional: She is oriented to person, place, and time.  Obese, elderly female patient in no acute distress  HENT:  Head: Normocephalic and atraumatic.  Mouth/Throat: Oropharynx is clear and moist.  Eyes: Conjunctivae are normal. Pupils are equal, round, and reactive to light.  Neck: Normal range of motion. Neck supple. No thyromegaly present.  Cardiovascular: Normal rate and regular rhythm.   Pulmonary/Chest: Effort normal. No respiratory distress. She has wheezes. She has rales. She exhibits no tenderness.  Abdominal: Soft. Bowel sounds are normal. There is no tenderness.  Musculoskeletal: She exhibits edema.  Pitting leg edema with chronic skin changes, good dorsalis pedis, Can move all 4 extremities, on wheelchair  Lymphadenopathy:    She has no cervical adenopathy.  Neurological: She is alert and oriented to person, place, and time.  Skin: Skin is warm and dry.  Psychiatric: She has a normal mood and affect. Her behavior is normal.    Labs reviewed:  Recent Labs  03/13/16  07/10/16 07/14/16  NA 139 141 139  K 4.2 3.9 4.1  BUN 45* 43* 30*  CREATININE 0.9 0.9 0.9    Recent Labs  11/26/15 03/13/16 07/10/16  AST 18 16 14   ALT 15 11 12   ALKPHOS 94 80 86    Recent Labs  11/26/15 03/13/16 07/10/16  WBC 7.7 7.0 8.1  HGB 10.2* 10.3*  9.8*  HCT 31* 32* 30*  PLT 221 184 168   Lab Results  Component Value Date   TSH 2.61 07/10/2016   Lab Results  Component Value Date   HGBA1C 7.6 07/10/2016   Lab Results  Component Value Date   CHOL 109 07/19/2015   HDL 75 (A) 07/19/2015   LDLCALC 26 07/19/2015   TRIG 39 (A) 07/19/2015    Significant Diagnostic Results in last 30 days:  No results found.  Assessment/Plan  CHF exacerbation Has 15 lb weight gain on chart review. Has wheezing with rales on exam. Increase lasix to 40 mg bid and add kcl 20 meq daily . Check cmp in 5 days. Keep legs elevated at rest. After 1 week, add ted hose to help with leg edema. Continue o2 by nasal canula for now. Consider chest xray and BNP if symptoms worsen. Schedule her duoneb for now.  Wt Readings from Last 3 Encounters:  08/11/16 210 lb (95.3 kg)  08/07/16 210 lb (95.3 kg)  07/28/16 195 lb (88.5 kg)    OAB With likely increase with diuresis, will likely worsen. Currently on doxazosin. Monitor for now.   COPD On chronic prednisone 5 mg daily, duoneb q6h prn. Change duoneb to tid for now x 2 weeks, reassess in 1 week  Chronic steroid use For maintenance therapy with her COPD. check on skin integrity, monitor blood sugar with her history of DM.   DM 2 with ckd cbg reading improved with most of her readings below 200. Continue 18 u lantus daily with pre-meal lispro and monitor. Continue losartan for renal protection    Family/ staff Communication: reviewed care plan with patient and charge nurse.    Labs/tests ordered:  Cmp, mg next week  Blanchie Serve, MD Internal Medicine [facility] [place], [place]  [id number] Cell Phone (Monday-Friday 8 am - 5 pm): [phone] On Call: [phone] and follow prompts after 5 pm and on weekends Office Phone: [phone] Office Fax: [phone]

## 2016-08-18 DIAGNOSIS — I10 Essential (primary) hypertension: Secondary | ICD-10-CM | POA: Diagnosis not present

## 2016-08-18 DIAGNOSIS — R531 Weakness: Secondary | ICD-10-CM | POA: Diagnosis not present

## 2016-08-18 DIAGNOSIS — E785 Hyperlipidemia, unspecified: Secondary | ICD-10-CM | POA: Diagnosis not present

## 2016-08-18 LAB — HEPATIC FUNCTION PANEL
ALK PHOS: 80 (ref 25–125)
ALT: 17 (ref 7–35)
AST: 20 (ref 13–35)
Bilirubin, Total: 0.3

## 2016-08-18 LAB — LIPID PANEL
CHOLESTEROL: 96 (ref 0–200)
HDL: 58 (ref 35–70)
LDL Cholesterol: 22
Triglycerides: 82 (ref 40–160)

## 2016-08-18 LAB — BASIC METABOLIC PANEL
BUN: 35 — AB (ref 4–21)
CREATININE: 1.1 (ref 0.5–1.1)
Glucose: 110
Potassium: 3.9 (ref 3.4–5.3)
SODIUM: 139 (ref 137–147)

## 2016-08-25 ENCOUNTER — Encounter: Payer: Self-pay | Admitting: Family

## 2016-08-25 ENCOUNTER — Non-Acute Institutional Stay (SKILLED_NURSING_FACILITY): Payer: Medicare Other | Admitting: Family

## 2016-08-25 DIAGNOSIS — G2581 Restless legs syndrome: Secondary | ICD-10-CM | POA: Diagnosis not present

## 2016-08-25 MED ORDER — ROPINIROLE HCL 0.25 MG PO TABS: 0.2500 mg | ORAL_TABLET | Freq: Every day | ORAL | 2 refills | Status: DC

## 2016-08-25 NOTE — Progress Notes (Signed)
Location:  [place] Room Number: 16 Place of Service:  SNF [phone]) Provider: Madinah Quarry FNP-C  Blanchie Serve, MD  Patient Care Team: Blanchie Serve, MD as PCP - General (Internal Medicine) Clent Jacks, MD (Ophthalmology) Adrian Prows, MD as Attending Physician (Cardiology) [place], [facility] [name], MD as Consulting Physician (Orthopedic Surgery) Clance, Armando Reichert, MD as Consulting Physician (Pulmonary Disease) Delesa Kawa, Nelda Bucks, NP as Nurse Practitioner [facility] Medicine)  Extended Emergency Contact Information Primary Emergency Contact: Inda,[name] Address: [address]          [place], [place] [address] [name] of [place] Phone: [phone] Mobile Phone: [phone] Relation: Son Secondary Emergency Contact: [name] [place] of [place] Mobile Phone: [phone] Relation: Daughter  Code Status:  DNR Goals of care: Advanced Directive information Advanced Directives 08/25/2016  Does Patient Have a Medical Advance Directive? Yes  Type of Advance Directive Out of facility DNR (pink MOST or yellow form);Living will;Healthcare Power of Attorney  Does patient want to make changes to medical advance directive? -  Copy of [place] in Chart? Yes  Pre-existing out of facility DNR order (yellow form or pink MOST form) -     Chief Complaint  Patient presents with  . Acute Visit    RLS    HPI:  Pt is a 83 y.o. female seen today at [facility] for an acute visit for evaluation of restless leg at night. She is seen in her room today. She complains of restless leg at night. She states was not able sleep last night from 1 Am until 3 am due to restless leg. She would like to take medication whenever she needs it. She denies any leg pain, numbness or tingling.Otherwise states doing well in general. She has her oxygen off during visit. She states walks to the bathroom without oxygen via nasal cannula.     Past Medical History:  Diagnosis Date  . Abnormality of gait   . Anemia, unspecified   . Arthritis   . Bronchiectasis without acute exacerbation ([place]) 04/10/2011   CT chest 2011   . Cataract 1990   Dr. Katy Fitch  . Cholelithiases   . Closed fracture of unspecified part of ramus of mandible   . COPD (chronic obstructive pulmonary disease) ([place])   . Coronary atherosclerosis of unspecified type of vessel, native or graft   . Disturbance of skin sensation   . DOE (dyspnea on exertion) 04/10/2011   PFTs 2013:  FEV1 1.04 (78%), ratio 64, couldn't do maneuver for lung volumes or dlco   . Edema   . Esophageal reflux   . Fall from other slipping, tripping, or stumbling   . Gastric ulcer with hemorrhage 2008  . Hypertension   . Hypoxemia   . Insomnia, unspecified   . Mucopurulent chronic bronchitis ([place])   . Nodule of neck 11/07/2013   Left neck posteriorly   . Nontoxic uninodular goiter   . Osteoarthrosis, unspecified whether generalized or localized, unspecified site   . Other and unspecified hyperlipidemia   . Other malaise and fatigue   . Pain in joint, lower leg 2010   right knee  . Pain in joint, shoulder region 2013   left  . Palpitations   . Pneumonia, organism unspecified(486)   . Regional enteritis of unspecified site   . Rosacea   . Sciatica   . Squamous cell carcinoma of skin of lower limb, including hip 07/27/2012   Nonhealing lesion of  the left mid calf medially   . Tachycardia 12/06/2014  . Tension headache   . Tension headache   . Type II or unspecified type diabetes mellitus without mention of complication, not stated as uncontrolled   . Unspecified venous (peripheral) insufficiency   . Urine, incontinence, stress female 07/19/2013   Past Surgical History:  Procedure Laterality Date  . EYE SURGERY Bilateral 1990   Dr. Katy Fitch  . GASTROJEJUNOSTOMY  05/2004   secondary to ulcer  . JOINT REPLACEMENT Bilateral 1993-1998   total knee replacement  . MOLE REMOVAL   05/11/14   left hand and left side of face [facility]  . OTHER SURGICAL HISTORY  2008   Ulcer surgery   . SMALL INTESTINE SURGERY    . TOTAL KNEE ARTHROPLASTY Bilateral 1993, 1998   knees    Allergies  Allergen Reactions  . Adhesive [Tape]     unknown  . Aspirin     unknown  . Augmentin [Amoxicillin-Pot Clavulanate]   . Codeine     unknown  . Diclofenac   . Enalapril Cough  . Hydrocodone     unknown  . Pantoprazole Sodium     unknown  . Voltaren [Diclofenac Sodium]     Allergies as of 08/25/2016      Reactions   Adhesive [tape]    unknown   Aspirin    unknown   Augmentin [amoxicillin-pot Clavulanate]    Codeine    unknown   Diclofenac    Enalapril Cough   Hydrocodone    unknown   Pantoprazole Sodium    unknown   Voltaren [diclofenac Sodium]       Medication List       Accurate as of 08/25/16  5:02 PM. Always use your most recent med list.          acetaminophen 500 MG tablet Commonly known as:  TYLENOL Take 500 mg by mouth. Take one once daily in morning, take 2 at bedtime   acetaminophen 325 MG tablet Commonly known as:  TYLENOL Take 650 mg by mouth every 6 (six) hours as needed for moderate pain or headache.   atorvastatin 10 MG tablet Commonly known as:  LIPITOR Take 10 mg by mouth at bedtime.   carbamide peroxide 6.5 % OTIC solution Commonly known as:  DEBROX Place 5 drops into the left ear at bedtime. On the 12th, 13th and 14th of the month   doxazosin 8 MG tablet Commonly known as:  CARDURA Take 8 mg by mouth daily. To control BP.   furosemide 40 MG tablet Commonly known as:  LASIX Take 40 mg by mouth 2 (two) times daily. Hold for SBP <110   HUMALOG 100 UNIT/ML cartridge Generic drug:  insulin lispro Inject 10 Units into the skin. At 1230 PM and 530 PM. (If blood sugar is 150-250 = 8 units, if blood sugar is greater than 250 = 12 units daily)   IMODIUM A-D 2 MG tablet Generic drug:  loperamide Take 4 mg by mouth as needed  for diarrhea or loose stools. Give 4 mg as the initial dose. Then give 2 mg after each loose stool   insulin glargine 100 UNIT/ML injection Commonly known as:  LANTUS Inject 18 Units into the skin at bedtime.   ipratropium-albuterol 0.5-2.5 (3) MG/3ML Soln Commonly known as:  DUONEB Take 3 mLs by nebulization as directed. Twice daily with every 8 hours as needed   loratadine 10 MG tablet Commonly known as:  CLARITIN Take  10 mg by mouth daily as needed for allergies.   losartan 50 MG tablet Commonly known as:  COZAAR Take 50 mg by mouth daily.   metoprolol tartrate 25 MG tablet Commonly known as:  LOPRESSOR Take 25 mg by mouth 2 (two) times daily.   multivitamin with minerals tablet Take 1 tablet by mouth daily.   omeprazole 20 MG capsule Commonly known as:  PRILOSEC Take 20 mg by mouth daily.   OXYGEN Inhale into the lungs. 3 liter via nasal canula   predniSONE 5 MG tablet Commonly known as:  DELTASONE Take 5 mg by mouth daily with breakfast.   rOPINIRole 0.25 MG tablet Commonly known as:  REQUIP Take 1 tablet (0.25 mg total) by mouth at bedtime. As needed   spironolactone 25 MG tablet Commonly known as:  ALDACTONE Take 12.5 mg by mouth daily.   traZODone 50 MG tablet Commonly known as:  DESYREL Take 0.5 tablets (25 mg total) by mouth at bedtime.       Review of Systems  Constitutional: Negative for activity change, appetite change, chills, fatigue and fever.  Respiratory: Negative for cough, chest tightness, shortness of breath and wheezing.   Cardiovascular: Positive for leg swelling. Negative for chest pain and palpitations.  Musculoskeletal: Positive for arthralgias and gait problem.  Skin: Negative for color change, pallor and rash.  Neurological: Negative for dizziness, seizures, syncope, weakness, light-headedness and numbness.  Psychiatric/Behavioral: Positive for sleep disturbance. Negative for agitation and confusion. The patient is not  nervous/anxious.     Immunization History  Administered Date(s) Administered  . Influenza Split 11/06/2011, 11/04/2012  . Influenza,inj,Quad PF,36+ Mos 11/17/2013  . Influenza-Unspecified 11/09/2014, 11/16/2015, 11/25/2015  . PPD Test 06/19/2010, 11/29/2014  . Pneumococcal Conjugate-13 08/11/2014  . Pneumococcal Polysaccharide-23 02/04/2003  . Pneumococcal-Unspecified 06/07/2014  . Tdap 02/27/2013  . Zoster 02/03/2006   Pertinent  Health Maintenance Due  Topic Date Due  . DEXA SCAN  07/18/2026 (Originally 10/31/1984)  . INFLUENZA VACCINE  09/03/2016  . HEMOGLOBIN A1C  01/09/2017  . FOOT EXAM  02/07/2017  . OPHTHALMOLOGY EXAM  07/22/2017  . PNA vac Low Risk Adult  Completed   Fall Risk  12/06/2014 07/11/2014 05/22/2014 11/07/2013 08/30/2013  Falls in the past year? Yes No Yes No No  Number falls in past yr: 1 - 1 - -  Injury with Fall? No - No - -  Risk for fall due to : History of fall(s);Impaired mobility - History of fall(s);Impaired balance/gait - Impaired balance/gait;Impaired mobility  Follow up - - Falls evaluation completed - -    Vitals:   08/25/16 1342  BP: 136/84  Pulse: 73  Resp: 16  Temp: 98.3 F (36.8 C)  SpO2: 95%  Weight: 210 lb (95.3 kg)  Height: 5\' 4"  (1.626 m)   Body mass index is 36.05 kg/m. Physical Exam  Constitutional: She is oriented to person, place, and time. She appears well-developed and well-nourished. No distress.  Cardiovascular: Normal rate, regular rhythm, normal heart sounds and intact distal pulses.  Exam reveals no gallop and no friction rub.   No murmur heard. Pulmonary/Chest: Effort normal and breath sounds normal. No respiratory distress. She has no wheezes. She has no rales.  Abdominal: Soft. Bowel sounds are normal. She exhibits no distension. There is no tenderness. There is no rebound and no guarding.  Musculoskeletal: She exhibits no tenderness or deformity.  Unsteady gait uses wheelchair. lower extremities 1+ edema.    Neurological: She is oriented to person, place, and time.  Skin: Skin is warm and dry. No rash noted. No erythema. No pallor.  Psychiatric: She has a normal mood and affect.    Labs reviewed:  Recent Labs  03/13/16 07/10/16 07/14/16  NA 139 141 139  K 4.2 3.9 4.1  BUN 45* 43* 30*  CREATININE 0.9 0.9 0.9    Recent Labs  11/26/15 03/13/16 07/10/16  AST 18 16 14   ALT 15 11 12   ALKPHOS 94 80 86    Recent Labs  11/26/15 03/13/16 07/10/16  WBC 7.7 7.0 8.1  HGB 10.2* 10.3* 9.8*  HCT 31* 32* 30*  PLT 221 184 168   Lab Results  Component Value Date   TSH 2.61 07/10/2016   Lab Results  Component Value Date   HGBA1C 7.6 07/10/2016   Lab Results  Component Value Date   CHOL 109 07/19/2015   HDL 75 (A) 07/19/2015   LDLCALC 26 07/19/2015   TRIG 39 (A) 07/19/2015    Significant Diagnostic Results in last 30 days:  No results found.  Assessment/Plan  RLS (restless legs syndrome) Worst at bedtime but would like medication as needed. Start Requip 0.25 mg tablet at bedtime as needed for 90 days. Continue to monitor.   Family/ staff Communication: Reviewed plan of care with patient and facility Nurse.   Labs/tests ordered: None   Nickolas Chalfin C Kinzey Sheriff, NP

## 2016-08-29 ENCOUNTER — Non-Acute Institutional Stay (SKILLED_NURSING_FACILITY): Payer: Medicare Other | Admitting: Family

## 2016-08-29 DIAGNOSIS — L570 Actinic keratosis: Secondary | ICD-10-CM | POA: Diagnosis not present

## 2016-08-29 NOTE — Progress Notes (Signed)
Location:  [place] Room Number: 16 Place of Service:  SNF [phone]) Provider: Dinah Ngetich FNP-C  Blanchie Serve, MD  Patient Care Team: Blanchie Serve, MD as PCP - General (Internal Medicine) Clent Jacks, MD (Ophthalmology) Adrian Prows, MD as Attending Physician (Cardiology) [place], [facility] [name], MD as Consulting Physician (Orthopedic Surgery) Clance, Armando Reichert, MD as Consulting Physician (Pulmonary Disease) Ngetich, Nelda Bucks, NP as Nurse Practitioner [facility] Medicine)  Extended Emergency Contact Information Primary Emergency Contact: Dietze,[name] Address: [address]          [place], [place] [address] [name] of [place] Phone: [phone] Mobile Phone: [phone] Relation: Son Secondary Emergency Contact: [name] [place] of [place] Mobile Phone: [phone] Relation: Daughter  Code Status:  DNR  Goals of care: Advanced Directive information Advanced Directives 08/25/2016  Does Patient Have a Medical Advance Directive? Yes  Type of Advance Directive Out of facility DNR (pink MOST or yellow form);Living will;Healthcare Power of Attorney  Does patient want to make changes to medical advance directive? -  Copy of [place] in Chart? Yes  Pre-existing out of facility DNR order (yellow form or pink MOST form) -     Chief Complaint  Patient presents with  . Acute Visit    lesion of left side of the face    HPI:  Pt is a 83 y.o. female seen today at [facility] for an acute visit for evaluation of left side of face skin lesion. she would like lesion to be removed. She states lesion seems to be worsening but no changes in color. No drainage or itching from site.    Past Medical History:  Diagnosis Date  . Abnormality of gait   . Anemia, unspecified   . Arthritis   . Bronchiectasis without acute exacerbation ([place]) 04/10/2011   CT chest 2011   . Cataract 1990   Dr. Katy Fitch  .  Cholelithiases   . Closed fracture of unspecified part of ramus of mandible   . COPD (chronic obstructive pulmonary disease) ([place])   . Coronary atherosclerosis of unspecified type of vessel, native or graft   . Disturbance of skin sensation   . DOE (dyspnea on exertion) 04/10/2011   PFTs 2013:  FEV1 1.04 (78%), ratio 64, couldn't do maneuver for lung volumes or dlco   . Edema   . Esophageal reflux   . Fall from other slipping, tripping, or stumbling   . Gastric ulcer with hemorrhage 2008  . Hypertension   . Hypoxemia   . Insomnia, unspecified   . Mucopurulent chronic bronchitis ([place])   . Nodule of neck 11/07/2013   Left neck posteriorly   . Nontoxic uninodular goiter   . Osteoarthrosis, unspecified whether generalized or localized, unspecified site   . Other and unspecified hyperlipidemia   . Other malaise and fatigue   . Pain in joint, lower leg 2010   right knee  . Pain in joint, shoulder region 2013   left  . Palpitations   . Pneumonia, organism unspecified(486)   . Regional enteritis of unspecified site   . Rosacea   . Sciatica   . Squamous cell carcinoma of skin of lower limb, including hip 07/27/2012   Nonhealing lesion of the left mid calf medially   . Tachycardia 12/06/2014  . Tension headache   . Tension headache   . Type II or unspecified type diabetes mellitus without mention of complication, not stated as uncontrolled   .  Unspecified venous (peripheral) insufficiency   . Urine, incontinence, stress female 07/19/2013   Past Surgical History:  Procedure Laterality Date  . EYE SURGERY Bilateral 1990   Dr. Katy Fitch  . GASTROJEJUNOSTOMY  05/2004   secondary to ulcer  . JOINT REPLACEMENT Bilateral 1993-1998   total knee replacement  . MOLE REMOVAL  05/11/14   left hand and left [facility]  . OTHER SURGICAL HISTORY  2008   Ulcer surgery   . SMALL INTESTINE SURGERY    . TOTAL KNEE ARTHROPLASTY Bilateral 1993, 1998   knees    Allergies  Allergen  Reactions  . Adhesive [Tape]     unknown  . Aspirin     unknown  . Augmentin [Amoxicillin-Pot Clavulanate]   . Codeine     unknown  . Diclofenac   . Enalapril Cough  . Hydrocodone     unknown  . Pantoprazole Sodium     unknown  . Voltaren [Diclofenac Sodium]     Allergies as of 08/29/2016      Reactions   Adhesive [tape]    unknown   Aspirin    unknown   Augmentin [amoxicillin-pot Clavulanate]    Codeine    unknown   Diclofenac    Enalapril Cough   Hydrocodone    unknown   Pantoprazole Sodium    unknown   Voltaren [diclofenac Sodium]       Medication List       Accurate as of 08/29/16  4:13 PM. Always use your most recent med list.          acetaminophen 500 MG tablet Commonly known as:  TYLENOL Take 500 mg by mouth. Take one once daily in morning, take 2 at bedtime   acetaminophen 325 MG tablet Commonly known as:  TYLENOL Take 650 mg by mouth every 6 (six) hours as needed for moderate pain or headache.   atorvastatin 10 MG tablet Commonly known as:  LIPITOR Take 10 mg by mouth at bedtime.   carbamide peroxide 6.5 % OTIC solution Commonly known as:  DEBROX Place 5 drops into the left ear at bedtime. On the 12th, 13th and 14th of the month   doxazosin 8 MG tablet Commonly known as:  CARDURA Take 8 mg by mouth daily. To control BP.   furosemide 40 MG tablet Commonly known as:  LASIX Take 40 mg by mouth 2 (two) times daily. Hold for SBP <110   HUMALOG 100 UNIT/ML cartridge Generic drug:  insulin lispro Inject 10 Units into the skin. At 1230 PM and 530 PM. (If blood sugar is 150-250 = 8 units, if blood sugar is greater than 250 = 12 units daily)   IMODIUM A-D 2 MG tablet Generic drug:  loperamide Take 4 mg by mouth as needed for diarrhea or loose stools. Give 4 mg as the initial dose. Then give 2 mg after each loose stool   insulin glargine 100 UNIT/ML injection Commonly known as:  LANTUS Inject 18 Units into the skin at bedtime.     ipratropium-albuterol 0.5-2.5 (3) MG/3ML Soln Commonly known as:  DUONEB Take 3 mLs by nebulization as directed. Twice daily with every 8 hours as needed   loratadine 10 MG tablet Commonly known as:  CLARITIN Take 10 mg by mouth daily as needed for allergies.   losartan 50 MG tablet Commonly known as:  COZAAR Take 50 mg by mouth daily.   metoprolol tartrate 25 MG tablet Commonly known as:  LOPRESSOR Take  25 mg by mouth 2 (two) times daily.   multivitamin with minerals tablet Take 1 tablet by mouth daily.   omeprazole 20 MG capsule Commonly known as:  PRILOSEC Take 20 mg by mouth daily.   OXYGEN Inhale into the lungs. 3 liter via nasal canula   predniSONE 5 MG tablet Commonly known as:  DELTASONE Take 5 mg by mouth daily with breakfast.   rOPINIRole 0.25 MG tablet Commonly known as:  REQUIP Take 1 tablet (0.25 mg total) by mouth at bedtime. As needed   spironolactone 25 MG tablet Commonly known as:  ALDACTONE Take 12.5 mg by mouth daily.   traZODone 50 MG tablet Commonly known as:  DESYREL Take 0.5 tablets (25 mg total) by mouth at bedtime.       Review of Systems  Constitutional: Negative for activity change, appetite change, chills, fatigue and fever.  HENT: Positive for hearing loss. Negative for facial swelling and sore throat.   Respiratory: Negative for cough, chest tightness, shortness of breath and wheezing.        On continuous oxygen via [place]   Cardiovascular: Negative for chest pain, palpitations and leg swelling.  Musculoskeletal: Positive for arthralgias and gait problem.  Skin: Negative for color change, pallor and rash.       Left side of the face lesion   Neurological: Negative for dizziness, seizures, syncope, weakness, light-headedness and numbness.  Psychiatric/Behavioral: Negative for agitation and confusion. The patient is not nervous/anxious.     Immunization History  Administered Date(s) Administered  . Influenza Split 11/06/2011,  11/04/2012  . Influenza,inj,Quad PF,36+ Mos 11/17/2013  . Influenza-Unspecified 11/09/2014, 11/16/2015, 11/25/2015  . PPD Test 06/19/2010, 11/29/2014  . Pneumococcal Conjugate-13 08/11/2014  . Pneumococcal Polysaccharide-23 02/04/2003  . Pneumococcal-Unspecified 06/07/2014  . Tdap 02/27/2013  . Zoster 02/03/2006   Pertinent  Health Maintenance Due  Topic Date Due  . DEXA SCAN  07/18/2026 (Originally 10/31/1984)  . INFLUENZA VACCINE  09/03/2016  . HEMOGLOBIN A1C  01/09/2017  . FOOT EXAM  02/07/2017  . OPHTHALMOLOGY EXAM  07/22/2017  . PNA vac Low Risk Adult  Completed   Fall Risk  12/06/2014 07/11/2014 05/22/2014 11/07/2013 08/30/2013  Falls in the past year? Yes No Yes No No  Number falls in past yr: 1 - 1 - -  Injury with Fall? No - No - -  Risk for fall due to : History of fall(s);Impaired mobility - History of fall(s);Impaired balance/gait - Impaired balance/gait;Impaired mobility  Follow up - - Falls evaluation completed - -    Vitals:   08/29/16 1607  BP: (!) 153/64  Pulse: 78  Resp: 18  Temp: (!) 97 F (36.1 C)  SpO2: 93%  Weight: 210 lb (95.3 kg)  Height: 5\' 4"  (1.626 m)   Body mass index is 36.05 kg/m. Physical Exam  Constitutional: She is oriented to person, place, and time. She appears well-developed and well-nourished. No distress.  HENT:  Mouth/Throat: Oropharynx is clear and moist. No oropharyngeal exudate.  Eyes: Pupils are equal, round, and reactive to light. Conjunctivae and EOM are normal. Right eye exhibits no discharge. Left eye exhibits no discharge. No scleral icterus.  Neck: Normal range of motion. No thyromegaly present.  Cardiovascular: Normal rate, regular rhythm, normal heart sounds and intact distal pulses.  Exam reveals no gallop and no friction rub.   No murmur heard. Pulmonary/Chest: Effort normal and breath sounds normal. No respiratory distress. She has no wheezes. She has no rales.  Abdominal: Soft. Bowel sounds are normal.  She exhibits no  distension. There is no tenderness. There is no rebound and no guarding.  Lymphadenopathy:    She has no cervical adenopathy.  Neurological: She is oriented to person, place, and time.  Skin: Skin is warm and dry. No rash noted. No erythema. No pallor.  Left cheek skin lesion whitish -grayish in color,raised and rough in texture. No redness, tenderness or drainage noted.   Psychiatric: She has a normal mood and affect.    Labs reviewed:  Recent Labs  03/13/16 07/10/16 07/14/16  NA 139 141 139  K 4.2 3.9 4.1  BUN 45* 43* 30*  CREATININE 0.9 0.9 0.9    Recent Labs  11/26/15 03/13/16 07/10/16  AST 18 16 14   ALT 15 11 12   ALKPHOS 94 80 86    Recent Labs  11/26/15 03/13/16 07/10/16  WBC 7.7 7.0 8.1  HGB 10.2* 10.3* 9.8*  HCT 31* 32* 30*  PLT 221 184 168   Lab Results  Component Value Date   TSH 2.61 07/10/2016   Lab Results  Component Value Date   HGBA1C 7.6 07/10/2016   Lab Results  Component Value Date   CHOL 109 07/19/2015   HDL 75 (A) 07/19/2015   LDLCALC 26 07/19/2015   TRIG 39 (A) 07/19/2015    Significant Diagnostic Results in last 30 days:  No results found.  Assessment/Plan  AK (actinic keratosis) Left cheek skin lesion whitish -grayish in color,raised and rough in texture. No redness, tenderness or drainage noted.Refer to dermatology for removal. Continue to monitor.    Family/ staff Communication: Reviewed plan of care with patient and facility Nurse.   Labs/tests ordered: None   Dinah C Ngetich, NP

## 2016-09-03 ENCOUNTER — Encounter: Payer: Self-pay | Admitting: Family

## 2016-09-03 ENCOUNTER — Non-Acute Institutional Stay (SKILLED_NURSING_FACILITY): Payer: Medicare Other | Admitting: Family

## 2016-09-03 DIAGNOSIS — L03115 Cellulitis of right lower limb: Secondary | ICD-10-CM | POA: Diagnosis not present

## 2016-09-03 MED ORDER — SACCHAROMYCES BOULARDII 250 MG PO CAPS: 250.0000 mg | ORAL_CAPSULE | Freq: Two times a day (BID) | ORAL | 0 refills | Status: AC

## 2016-09-03 MED ORDER — DOXYCYCLINE HYCLATE 100 MG PO TABS: 100.0000 mg | ORAL_TABLET | Freq: Two times a day (BID) | ORAL | 0 refills | Status: AC

## 2016-09-03 NOTE — Progress Notes (Signed)
Location:  [place] Room Number: 16 Place of Service:  SNF ([phone]) Provider: Dinah Ngetich FNP-C  Blanchie Serve, MD  Patient Care Team: Blanchie Serve, MD as PCP - General (Internal Medicine) Clent Jacks, MD (Ophthalmology) Adrian Prows, MD as Attending Physician (Cardiology) [place], [facility] [name], MD as Consulting Physician (Orthopedic Surgery) Clance, Armando Reichert, MD as Consulting Physician (Pulmonary Disease) Ngetich, Nelda Bucks, NP as Nurse Practitioner [facility] Medicine)  Extended Emergency Contact Information Primary Emergency Contact: Mault,[name] Address: [address]          [place], [place] [address] [name] of [place] Phone: [phone] Mobile Phone: [phone] Relation: Son Secondary Emergency Contact: [name] [place] of [place] Mobile Phone: [phone] Relation: Daughter  Code Status:  DNR Goals of care: Advanced Directive information Advanced Directives 09/03/2016  Does Patient Have a Medical Advance Directive? Yes  Type of Advance Directive Out of facility DNR (pink MOST or yellow form);Living will;Healthcare Power of Attorney  Does patient want to make changes to medical advance directive? -  Copy of [place] in Chart? Yes  Pre-existing out of facility DNR order (yellow form or pink MOST form) Yellow form placed in chart (order not valid for inpatient use)     Chief Complaint  Patient presents with  . Acute Visit    right leg redness    HPI:  Pt is a 83 y.o. female seen today at [facility] for an acute visit for evaluation of right leg redness. She is seen in her room today.she complains of right shin area skin redness. She states was hit accidentally last week with a blood pressure machine.She states was unable to sleep last night due to throbbing pain on right leg.She noticed redness on shin area.She denies any fever or chills.    Past Medical History:  Diagnosis Date  .  Abnormality of gait   . Anemia, unspecified   . Arthritis   . Bronchiectasis without acute exacerbation ([place]) 04/10/2011   CT chest 2011   . Cataract 1990   Dr. Katy Fitch  . Cholelithiases   . Closed fracture of unspecified part of ramus of mandible   . COPD (chronic obstructive pulmonary disease) ([place])   . Coronary atherosclerosis of unspecified type of vessel, native or graft   . Disturbance of skin sensation   . DOE (dyspnea on exertion) 04/10/2011   PFTs 2013:  FEV1 1.04 (78%), ratio 64, couldn't do maneuver for lung volumes or dlco   . Edema   . Esophageal reflux   . Fall from other slipping, tripping, or stumbling   . Gastric ulcer with hemorrhage 2008  . Hypertension   . Hypoxemia   . Insomnia, unspecified   . Mucopurulent chronic bronchitis ([place])   . Nodule of neck 11/07/2013   Left neck posteriorly   . Nontoxic uninodular goiter   . Osteoarthrosis, unspecified whether generalized or localized, unspecified site   . Other and unspecified hyperlipidemia   . Other malaise and fatigue   . Pain in joint, lower leg 2010   right knee  . Pain in joint, shoulder region 2013   left  . Palpitations   . Pneumonia, organism unspecified(486)   . Regional enteritis of unspecified site   . Rosacea   . Sciatica   . Squamous cell carcinoma of skin of lower limb, including hip 07/27/2012   Nonhealing lesion of the left mid calf medially   . Tachycardia 12/06/2014  .  Tension headache   . Tension headache   . Type II or unspecified type diabetes mellitus without mention of complication, not stated as uncontrolled   . Unspecified venous (peripheral) insufficiency   . Urine, incontinence, stress female 07/19/2013   Past Surgical History:  Procedure Laterality Date  . EYE SURGERY Bilateral 1990   Dr. Katy Fitch  . GASTROJEJUNOSTOMY  05/2004   secondary to ulcer  . JOINT REPLACEMENT Bilateral 1993-1998   total knee replacement  . MOLE REMOVAL  05/11/14   left [facility]  . OTHER SURGICAL HISTORY  2008   Ulcer surgery   . SMALL INTESTINE SURGERY    . TOTAL KNEE ARTHROPLASTY Bilateral 1993, 1998   knees    Allergies  Allergen Reactions  . Adhesive [Tape]     unknown  . Aspirin     unknown  . Augmentin [Amoxicillin-Pot Clavulanate]   . Codeine     unknown  . Diclofenac   . Enalapril Cough  . Hydrocodone     unknown  . Pantoprazole Sodium     unknown  . Voltaren [Diclofenac Sodium]     Allergies as of 09/03/2016      Reactions   Adhesive [tape]    unknown   Aspirin    unknown   Augmentin [amoxicillin-pot Clavulanate]    Codeine    unknown   Diclofenac    Enalapril Cough   Hydrocodone    unknown   Pantoprazole Sodium    unknown   Voltaren [diclofenac Sodium]       Medication List       Accurate as of 09/03/16  1:01 PM. Always use your most recent med list.          acetaminophen 500 MG tablet Commonly known as:  TYLENOL Take 500 mg by mouth. Take one once daily in morning, take 2 at bedtime   acetaminophen 325 MG tablet Commonly known as:  TYLENOL Take 650 mg by mouth every 6 (six) hours as needed for moderate pain or headache.   atorvastatin 10 MG tablet Commonly known as:  LIPITOR Take 10 mg by mouth at bedtime.   carbamide peroxide 6.5 % OTIC solution Commonly known as:  DEBROX Place 5 drops into the left ear at bedtime. On the 12th, 13th and 14th of the month   doxazosin 8 MG tablet Commonly known as:  CARDURA Take 8 mg by mouth daily. To control BP.   doxycycline 100 MG tablet Commonly known as:  VIBRA-TABS Take 1 tablet (100 mg total) by mouth 2 (two) times daily.   furosemide 40 MG tablet Commonly known as:  LASIX Take 40 mg by mouth 2 (two) times daily. Hold for SBP <110   HUMALOG 100 UNIT/ML cartridge Generic drug:  insulin lispro Inject 10 Units into the skin. At 1230 PM and 530 PM. (If blood sugar is 150-250 = 8 units, if blood sugar is greater than 250 = 12 units daily)   IMODIUM  A-D 2 MG tablet Generic drug:  loperamide Take 4 mg by mouth as needed for diarrhea or loose stools. Give 4 mg as the initial dose. Then give 2 mg after each loose stool   insulin glargine 100 UNIT/ML injection Commonly known as:  LANTUS Inject 18 Units into the skin at bedtime.   ipratropium-albuterol 0.5-2.5 (3) MG/3ML Soln Commonly known as:  DUONEB Take 3 mLs by nebulization as directed. Twice daily with every 8 hours as needed  losartan 50 MG tablet Commonly known as:  COZAAR Take 50 mg by mouth daily.   metoprolol tartrate 25 MG tablet Commonly known as:  LOPRESSOR Take 25 mg by mouth 2 (two) times daily.   multivitamin with minerals tablet Take 1 tablet by mouth daily.   omeprazole 20 MG capsule Commonly known as:  PRILOSEC Take 20 mg by mouth daily.   OXYGEN Inhale into the lungs. 3 liter via nasal canula   predniSONE 5 MG tablet Commonly known as:  DELTASONE Take 5 mg by mouth daily with breakfast.   rOPINIRole 0.25 MG tablet Commonly known as:  REQUIP Take 1 tablet (0.25 mg total) by mouth at bedtime. As needed   spironolactone 25 MG tablet Commonly known as:  ALDACTONE Take 12.5 mg by mouth daily.   traZODone 50 MG tablet Commonly known as:  DESYREL Take 0.5 tablets (25 mg total) by mouth at bedtime.       Review of Systems  Constitutional: Negative for activity change, appetite change, chills, fatigue and fever.  Respiratory: Negative for cough, chest tightness, shortness of breath and wheezing.   Cardiovascular: Negative for chest pain, palpitations and leg swelling.  Musculoskeletal: Positive for arthralgias and gait problem.       Right leg pain   Skin: Negative for pallor and rash.       Right leg shin area red, painful scab.   Psychiatric/Behavioral: Negative for agitation and confusion. The patient is not nervous/anxious.     Immunization History  Administered Date(s) Administered  . Influenza Split 11/06/2011, 11/04/2012  .  Influenza,inj,Quad PF,36+ Mos 11/17/2013  . Influenza-Unspecified 11/09/2014, 11/16/2015, 11/25/2015  . PPD Test 06/19/2010, 11/29/2014  . Pneumococcal Conjugate-13 08/11/2014  . Pneumococcal Polysaccharide-23 02/04/2003  . Pneumococcal-Unspecified 06/07/2014  . Tdap 02/27/2013  . Zoster 02/03/2006   Pertinent  Health Maintenance Due  Topic Date Due  . INFLUENZA VACCINE  09/03/2016  . DEXA SCAN  07/18/2026 (Originally 10/31/1984)  . HEMOGLOBIN A1C  01/09/2017  . FOOT EXAM  02/07/2017  . OPHTHALMOLOGY EXAM  07/22/2017  . PNA vac Low Risk Adult  Completed   Fall Risk  12/06/2014 07/11/2014 05/22/2014 11/07/2013 08/30/2013  Falls in the past year? Yes No Yes No No  Number falls in past yr: 1 - 1 - -  Injury with Fall? No - No - -  Risk for fall due to : History of fall(s);Impaired mobility - History of fall(s);Impaired balance/gait - Impaired balance/gait;Impaired mobility  Follow up - - Falls evaluation completed - -    Vitals:   09/03/16 1155  BP: (!) 150/80  Pulse: 85  Resp: (!) 22  Temp: 98.1 F (36.7 C)  SpO2: 94%  Weight: 210 lb (95.3 kg)  Height: 5\' 4"  (1.626 m)   Body mass index is 36.05 kg/m. Physical Exam  Constitutional: She is oriented to person, place, and time. She appears well-developed and well-nourished. No distress.  Cardiovascular: Normal rate, regular rhythm, normal heart sounds and intact distal pulses.  Exam reveals no gallop and no friction rub.   No murmur heard. Pulmonary/Chest: Effort normal and breath sounds normal. No respiratory distress. She has no wheezes. She has no rales.  Abdominal: Soft. Bowel sounds are normal. She exhibits no distension. There is no tenderness. There is no rebound and no guarding.  Musculoskeletal: Normal range of motion.  Neurological: She is oriented to person, place, and time.  Skin: Skin is warm and dry. No rash noted. No pallor.  Right shin area dime size  scab noted.Surrounding skin tissue red, warm and tender to touch.    Psychiatric: She has a normal mood and affect.    Labs reviewed:  Recent Labs  03/13/16 07/10/16 07/14/16  NA 139 141 139  K 4.2 3.9 4.1  BUN 45* 43* 30*  CREATININE 0.9 0.9 0.9    Recent Labs  11/26/15 03/13/16 07/10/16  AST 18 16 14   ALT 15 11 12   ALKPHOS 94 80 86    Recent Labs  11/26/15 03/13/16 07/10/16  WBC 7.7 7.0 8.1  HGB 10.2* 10.3* 9.8*  HCT 31* 32* 30*  PLT 221 184 168   Lab Results  Component Value Date   TSH 2.61 07/10/2016   Lab Results  Component Value Date   HGBA1C 7.6 07/10/2016   Lab Results  Component Value Date   CHOL 109 07/19/2015   HDL 75 (A) 07/19/2015   LDLCALC 26 07/19/2015   TRIG 39 (A) 07/19/2015    Significant Diagnostic Results in last 30 days:  No results found.  Assessment/Plan   Cellulitis of right lower extremity Afebrile.Right shin area dime size scab with surrounding skin tissue red, warm and tender to touch.will start on Doxycycline 100 mg Tablet one by mouth twice daily x 7 days along with [place] 250 mg Capsule one by mouth twice daily x 10 days for ABX associated diarrhea preventions.   Family/ staff Communication: Reviewed plan of care with patient and facility Nurse supervisor  Labs/tests ordered: None   Sandrea Hughs, NP

## 2016-09-10 ENCOUNTER — Encounter: Payer: Self-pay | Admitting: Family

## 2016-09-10 ENCOUNTER — Non-Acute Institutional Stay (SKILLED_NURSING_FACILITY): Payer: Medicare Other | Admitting: Family

## 2016-09-10 DIAGNOSIS — J449 Chronic obstructive pulmonary disease, unspecified: Secondary | ICD-10-CM

## 2016-09-10 DIAGNOSIS — E782 Mixed hyperlipidemia: Secondary | ICD-10-CM

## 2016-09-10 DIAGNOSIS — I129 Hypertensive chronic kidney disease with stage 1 through stage 4 chronic kidney disease, or unspecified chronic kidney disease: Secondary | ICD-10-CM | POA: Diagnosis not present

## 2016-09-10 DIAGNOSIS — I130 Hypertensive heart and chronic kidney disease with heart failure and stage 1 through stage 4 chronic kidney disease, or unspecified chronic kidney disease: Secondary | ICD-10-CM

## 2016-09-10 DIAGNOSIS — L570 Actinic keratosis: Secondary | ICD-10-CM | POA: Diagnosis not present

## 2016-09-10 DIAGNOSIS — I5042 Chronic combined systolic (congestive) and diastolic (congestive) heart failure: Secondary | ICD-10-CM

## 2016-09-10 DIAGNOSIS — N183 Chronic kidney disease, stage 3 (moderate): Secondary | ICD-10-CM | POA: Diagnosis not present

## 2016-09-10 DIAGNOSIS — L03115 Cellulitis of right lower limb: Secondary | ICD-10-CM | POA: Diagnosis not present

## 2016-09-10 DIAGNOSIS — E1122 Type 2 diabetes mellitus with diabetic chronic kidney disease: Secondary | ICD-10-CM | POA: Diagnosis not present

## 2016-09-10 DIAGNOSIS — B078 Other viral warts: Secondary | ICD-10-CM | POA: Diagnosis not present

## 2016-09-10 DIAGNOSIS — L821 Other seborrheic keratosis: Secondary | ICD-10-CM | POA: Diagnosis not present

## 2016-09-28 DIAGNOSIS — I130 Hypertensive heart and chronic kidney disease with heart failure and stage 1 through stage 4 chronic kidney disease, or unspecified chronic kidney disease: Secondary | ICD-10-CM | POA: Insufficient documentation

## 2016-09-28 DIAGNOSIS — I5042 Chronic combined systolic (congestive) and diastolic (congestive) heart failure: Principal | ICD-10-CM

## 2016-09-28 DIAGNOSIS — N183 Chronic kidney disease, stage 3 (moderate): Principal | ICD-10-CM

## 2016-09-28 NOTE — Progress Notes (Signed)
Location:  [facility] Room Number: 16 Place of Service:  SNF [phone]) Provider: Niveah Boerner FNP-C   Blanchie Serve, MD  Patient Care Team: Blanchie Serve, MD as PCP - General (Internal Medicine) Carla Jacks, MD (Ophthalmology) Adrian Prows, MD as Attending Physician (Cardiology) [place], [facility] [name], MD as Consulting Physician (Orthopedic Surgery) Clance, Armando Reichert, MD as Consulting Physician (Pulmonary Disease) [name], Nelda Bucks, NP as Nurse Practitioner [facility])  Extended Emergency Contact Information Primary Emergency Contact: Agrusa,[name] Address: [address]          [place], [place] [address] [name] of [place] Phone: [phone] Mobile Phone: [phone] Relation: Son Secondary Emergency Contact: [name] [place] of [place] Mobile Phone: [phone] Relation: Daughter  Code Status:  DNR Goals of care: Advanced Directive information Advanced Directives 09/10/2016  Does Patient Have a Medical Advance Directive? Yes  Type of Advance Directive Out of facility DNR (pink MOST or yellow form);Living will;Healthcare Power of Attorney  Does patient want to make changes to medical advance directive? -  Copy of [place] in Chart? Yes  Pre-existing out of facility DNR order (yellow form or pink MOST form) Yellow form placed in chart (order not valid for inpatient use)     Chief Complaint  Patient presents with  . Medical Management of Chronic Issues    routine visit    HPI:  Pt is a 83 y.o. female seen today [place] for medical management of chronic diseases.She has a medical history of HTN, CHF, CKD stage 3, Type 2 DM, COPD on continuous oxygen, Hyperlipidemia, GERD, OAB,Anemia among other conditions. She is seen in her room today. She complains of right leg redness and pain. She states right leg pain woke her up previous night. She denies any fever, chills or injury to leg.No recent  fall episodes or weight changes. Facility CBG's log reviewed CBG readings ranging in the 130's-200's with occasional 300's.    Past Medical History:  Diagnosis Date  . Abnormality of gait   . Anemia, unspecified   . Arthritis   . Bronchiectasis without acute exacerbation ([place]) 04/10/2011   CT chest 2011   . Cataract 1990   Dr. Katy Fitch  . Cholelithiases   . Closed fracture of unspecified part of ramus of mandible   . COPD (chronic obstructive pulmonary disease) ([place])   . Coronary atherosclerosis of unspecified type of vessel, native or graft   . Disturbance of skin sensation   . DOE (dyspnea on exertion) 04/10/2011   PFTs 2013:  FEV1 1.04 (78%), ratio 64, couldn't do maneuver for lung volumes or dlco   . Edema   . Esophageal reflux   . Fall from other slipping, tripping, or stumbling   . Gastric ulcer with hemorrhage 2008  . Hypertension   . Hypoxemia   . Insomnia, unspecified   . Mucopurulent chronic bronchitis ([place])   . Nodule of neck 11/07/2013   Left neck posteriorly   . Nontoxic uninodular goiter   . Osteoarthrosis, unspecified whether generalized or localized, unspecified site   . Other and unspecified hyperlipidemia   . Other malaise and fatigue   . Pain in joint, lower leg 2010   right knee  . Pain in joint, shoulder region 2013   left  . Palpitations   . Pneumonia, organism unspecified(486)   . Regional enteritis of unspecified site   . Rosacea   . Sciatica   . Squamous cell carcinoma of  skin of lower limb, including hip 07/27/2012   Nonhealing lesion of the left mid calf medially   . Tachycardia 12/06/2014  . Tension headache   . Tension headache   . Type II or unspecified type diabetes mellitus without mention of complication, not stated as uncontrolled   . Unspecified venous (peripheral) insufficiency   . Urine, incontinence, stress female 07/19/2013   Past Surgical History:  Procedure Laterality Date  . EYE SURGERY Bilateral 1990   Dr. Katy Fitch  .  GASTROJEJUNOSTOMY  05/2004   secondary to ulcer  . JOINT REPLACEMENT Bilateral 1993-1998   total knee replacement  . MOLE REMOVAL  05/11/14   left hand and left side of face [facility]  . OTHER SURGICAL HISTORY  2008   Ulcer surgery   . SMALL INTESTINE SURGERY    . TOTAL KNEE ARTHROPLASTY Bilateral 1993, 1998   knees    Allergies  Allergen Reactions  . Adhesive [Tape]     unknown  . Aspirin     unknown  . Augmentin [Amoxicillin-Pot Clavulanate]   . Codeine     unknown  . Diclofenac   . Enalapril Cough  . Hydrocodone     unknown  . Pantoprazole Sodium     unknown  . Voltaren [Diclofenac Sodium]     Allergies as of 09/10/2016      Reactions   Adhesive [tape]    unknown   Aspirin    unknown   Augmentin [amoxicillin-pot Clavulanate]    Codeine    unknown   Diclofenac    Enalapril Cough   Hydrocodone    unknown   Pantoprazole Sodium    unknown   Voltaren [diclofenac Sodium]       Medication List       Accurate as of 09/10/16 11:59 PM. Always use your most recent med list.          acetaminophen 500 MG tablet Commonly known as:  TYLENOL Take 500 mg by mouth. Take one once daily in morning, take 2 at bedtime   acetaminophen 325 MG tablet Commonly known as:  TYLENOL Take 650 mg by mouth every 6 (six) hours as needed for moderate pain or headache.   atorvastatin 10 MG tablet Commonly known as:  LIPITOR Take 10 mg by mouth at bedtime.   carbamide peroxide 6.5 % OTIC solution Commonly known as:  DEBROX Place 5 drops into the left ear at bedtime. On the 12th, 13th and 14th of the month   doxazosin 8 MG tablet Commonly known as:  CARDURA Take 8 mg by mouth daily. To control BP.   doxycycline 100 MG tablet Commonly known as:  VIBRA-TABS Take 1 tablet (100 mg total) by mouth 2 (two) times daily.   furosemide 40 MG tablet Commonly known as:  LASIX Take 40 mg by mouth 2 (two) times daily. Hold for SBP <110   HUMALOG 100 UNIT/ML cartridge Generic  drug:  insulin lispro Inject 10 Units into the skin. At 1230 PM and 530 PM. (If blood sugar is 150-250 = 8 units, if blood sugar is greater than 250 = 12 units daily)   IMODIUM A-D 2 MG tablet Generic drug:  loperamide Take 4 mg by mouth as needed for diarrhea or loose stools. Give 4 mg as the initial dose. Then give 2 mg after each loose stool   insulin glargine 100 UNIT/ML injection Commonly known as:  LANTUS Inject 18 Units into the skin at bedtime.   ipratropium-albuterol 0.5-2.5 (3)  MG/3ML Soln Commonly known as:  DUONEB Take 3 mLs by nebulization as directed. Twice daily with every 8 hours as needed   loratadine 10 MG tablet Commonly known as:  CLARITIN Take 10 mg by mouth daily as needed for allergies.   losartan 50 MG tablet Commonly known as:  COZAAR Take 50 mg by mouth daily.   metoprolol tartrate 25 MG tablet Commonly known as:  LOPRESSOR Take 25 mg by mouth 2 (two) times daily.   multivitamin with minerals tablet Take 1 tablet by mouth daily.   omeprazole 20 MG capsule Commonly known as:  PRILOSEC Take 20 mg by mouth daily.   OXYGEN Inhale into the lungs. 3 liter via nasal canula   predniSONE 5 MG tablet Commonly known as:  DELTASONE Take 5 mg by mouth daily with breakfast.   rOPINIRole 0.25 MG tablet Commonly known as:  REQUIP Take 1 tablet (0.25 mg total) by mouth at bedtime. As needed   saccharomyces boulardii 250 MG capsule Commonly known as:  FLORASTOR Take 1 capsule (250 mg total) by mouth 2 (two) times daily.   spironolactone 25 MG tablet Commonly known as:  ALDACTONE Take 12.5 mg by mouth daily.   traZODone 50 MG tablet Commonly known as:  DESYREL Take 0.5 tablets (25 mg total) by mouth at bedtime.       Review of Systems  Constitutional: Negative for activity change, appetite change, chills, fatigue and fever.  HENT: Negative for congestion, rhinorrhea, sinus pain, sinus pressure, sneezing and sore throat.   Eyes: Negative for pain,  discharge, redness and itching.  Respiratory: Negative for cough, chest tightness and wheezing.        Chronic shortness of breath with exertion   Cardiovascular: Negative for chest pain, palpitations and leg swelling.  Gastrointestinal: Negative for abdominal distention, abdominal pain, constipation, diarrhea, nausea and vomiting.  Endocrine: Negative for cold intolerance, heat intolerance, polydipsia, polyphagia and polyuria.  Genitourinary: Negative for dysuria, flank pain and urgency.  Musculoskeletal: Positive for gait problem.       Right leg pain   Skin: Negative for pallor and rash.       Right leg skin redness   Neurological: Negative for dizziness, seizures, light-headedness and headaches.  Hematological: Does not bruise/bleed easily.  Psychiatric/Behavioral: Negative for agitation, confusion, hallucinations and sleep disturbance. The patient is not nervous/anxious.     Immunization History  Administered Date(s) Administered  . Influenza Split 11/06/2011, 11/04/2012  . Influenza,inj,Quad PF,6+ Mos 11/17/2013  . Influenza-Unspecified 11/09/2014, 11/16/2015, 11/25/2015  . PPD Test 06/19/2010, 11/29/2014  . Pneumococcal Conjugate-13 08/11/2014  . Pneumococcal Polysaccharide-23 02/04/2003  . Pneumococcal-Unspecified 06/07/2014  . Tdap 02/27/2013  . Zoster 02/03/2006   Pertinent  Health Maintenance Due  Topic Date Due  . INFLUENZA VACCINE  09/03/2016  . DEXA SCAN  07/18/2026 (Originally 10/31/1984)  . HEMOGLOBIN A1C  01/09/2017  . FOOT EXAM  02/07/2017  . OPHTHALMOLOGY EXAM  07/22/2017  . PNA vac Low Risk Adult  Completed   Fall Risk  12/06/2014 07/11/2014 05/22/2014 11/07/2013 08/30/2013  Falls in the past year? Yes No Yes No No  Number falls in past yr: 1 - 1 - -  Injury with Fall? No - No - -  Risk for fall due to : History of fall(s);Impaired mobility - History of fall(s);Impaired balance/gait - Impaired balance/gait;Impaired mobility  Follow up - - Falls evaluation  completed - -    Vitals:   09/10/16 0953  BP: 128/66  Pulse: 69  Resp: 15  Temp: 97.8 F (36.6 C)  SpO2: 97%  Weight: 211 lb (95.7 kg)  Height: 5\' 4"  (1.626 m)   Body mass index is 36.22 kg/m. Physical Exam  Constitutional: She is oriented to person, place, and time. She appears well-developed and well-nourished. No distress.  HENT:  Head: Normocephalic.  Right Ear: External ear normal.  Left Ear: External ear normal.  Mouth/Throat: Oropharynx is clear and moist. No oropharyngeal exudate.  Eyes: Pupils are equal, round, and reactive to light. Conjunctivae and EOM are normal. Right eye exhibits no discharge. Left eye exhibits no discharge. No scleral icterus.  Neck: Normal range of motion. No JVD present. No thyromegaly present.  Cardiovascular: Normal rate, regular rhythm, normal heart sounds and intact distal pulses.  Exam reveals no gallop and no friction rub.   No murmur heard. Pulmonary/Chest: Effort normal and breath sounds normal. No respiratory distress. She has no wheezes. She has no rales.  Oxygen via [place] in place  Abdominal: Soft. Bowel sounds are normal. She exhibits no distension. There is no tenderness. There is no rebound and no guarding.  Musculoskeletal: She exhibits no deformity.  Moves x 4 extremities.unsteady gait spends most time on wheelchair and uses scooter on long distance.  Bilateral lower extremities trace-1+ edema.   Lymphadenopathy:    She has no cervical adenopathy.  Neurological: She is oriented to person, place, and time. Coordination normal.  Skin: Skin is warm and dry. No rash noted. No pallor.  Right leg shin area recent cellulitis redness has improved.   Psychiatric: She has a normal mood and affect.    Labs reviewed:  Recent Labs  03/13/16 07/10/16 07/14/16  NA 139 141 139  K 4.2 3.9 4.1  BUN 45* 43* 30*  CREATININE 0.9 0.9 0.9    Recent Labs  11/26/15 03/13/16 07/10/16  AST 18 16 14   ALT 15 11 12   ALKPHOS 94 80 86    Recent  Labs  11/26/15 03/13/16 07/10/16  WBC 7.7 7.0 8.1  HGB 10.2* 10.3* 9.8*  HCT 31* 32* 30*  PLT 221 184 168   Lab Results  Component Value Date   TSH 2.61 07/10/2016   Lab Results  Component Value Date   HGBA1C 7.6 07/10/2016   Lab Results  Component Value Date   CHOL 109 07/19/2015   HDL 75 (A) 07/19/2015   LDLCALC 26 07/19/2015   TRIG 39 (A) 07/19/2015    Significant Diagnostic Results in last 30 days:  No results found.  Assessment/Plan 1. Cellulitis of right leg Afebrile.Right leg shin area recent cellulitis redness has improved.compelete antibiotics.continue to monitor.   2. Hypertensive heart and kidney disease with chronic combined systolic and diastolic congestive heart failure and stage 3 chronic kidney disease  B/p stable.continue on aldactone 12.5 mg tablet daily,metoprolol 25 mg Tablet twice daily,losartan 50 mg tablet daily, furosemide 40 mg tablet twice daily and Doxazosin 8 mg tablet daily.continue to monitor BMP.    3. Chronic combined systolic and diastolic congestive heart failure Shortness of breath with exertion.bilateral lower extremities trace-1+ edema. Will continue on Furosemide 40 mg tablet twice daily, aldactone 12.5 mg tablet daily,metoprolol 25 mg Tablet twice daily,losartan 50 mg tablet daily and Doxazosin 8 mg tablet daily.  4. Chronic obstructive pulmonary disease Breathing stable.Continue on DuoNeb as needed and continuous oxygen via [place].   5. Mixed hyperlipidemia LDL at goal. Will decrease atorvastatin to 5 mg Tablet daily prevent muscle weakness due to advance age. Recheck lipid panel in 3 months.  6. Type 2 DM with CKD stage 3 and hypertension Lab Results  Component Value Date   HGBA1C 7.6 07/10/2016  CBG readings ranging in the 130's-200's with occasional 300's suspect due to snacks. Continue on Lantus 18 units at bedtime and Humalog 10 units three times with meals.On ARB and statin.continue to monitor.  Family/ staff Communication:  Reviewed plan of care with patient and facility Nurse.   Labs/tests ordered: lipid panel in 3 months  Carla Hughs, NP

## 2016-10-10 ENCOUNTER — Encounter: Payer: Self-pay | Admitting: Internal Medicine

## 2016-10-10 ENCOUNTER — Non-Acute Institutional Stay (SKILLED_NURSING_FACILITY): Payer: Medicare Other | Admitting: Internal Medicine

## 2016-10-10 DIAGNOSIS — E782 Mixed hyperlipidemia: Secondary | ICD-10-CM

## 2016-10-10 DIAGNOSIS — F5101 Primary insomnia: Secondary | ICD-10-CM

## 2016-10-10 DIAGNOSIS — N3281 Overactive bladder: Secondary | ICD-10-CM

## 2016-10-10 DIAGNOSIS — I5042 Chronic combined systolic (congestive) and diastolic (congestive) heart failure: Secondary | ICD-10-CM | POA: Diagnosis not present

## 2016-10-10 DIAGNOSIS — I10 Essential (primary) hypertension: Secondary | ICD-10-CM | POA: Diagnosis not present

## 2016-10-10 DIAGNOSIS — J449 Chronic obstructive pulmonary disease, unspecified: Secondary | ICD-10-CM

## 2016-10-10 NOTE — Progress Notes (Signed)
Location:  [facility] Room Number: 16 Place of Service:  SNF [phone]) Provider:  Blanchie Serve MD  Blanchie Serve, MD  Patient Care Team: Blanchie Serve, MD as PCP - General (Internal Medicine) Clent Jacks, MD (Ophthalmology) Adrian Prows, MD as Attending Physician (Cardiology) [place], [facility] [name], MD as Consulting Physician (Orthopedic Surgery) Clance, Armando Reichert, MD as Consulting Physician (Pulmonary Disease) Ngetich, Nelda Bucks, NP as Nurse Practitioner [facility])  Extended Emergency Contact Information Primary Emergency Contact: Biggers,[name] Address: [address]          [place], [place] [address] [name] of [place] Phone: [phone] Mobile Phone: [phone] Relation: Son Secondary Emergency Contact: [name] [place] of [place] Mobile Phone: [phone] Relation: Daughter  Code Status:  DNR Goals of care: Advanced Directive information Advanced Directives 10/10/2016  Does Patient Have a Medical Advance Directive? Yes  Type of Paramedic of [place];Out of facility DNR (pink MOST or yellow form)  Does patient want to make changes to medical advance directive? No - Patient declined  Copy of [place] in Chart? Yes  Pre-existing out of facility DNR order (yellow form or pink MOST form) Yellow form placed in chart (order not valid for inpatient use)     Chief Complaint  Patient presents with  . Medical Management of Chronic Issues    Routine Visit     HPI:  Pt is a 83 y.o. female seen today for medical management of chronic diseases. She denies any concern this visit. She has COPD. Her breathing is overall stable. She has wheezing this visit but denies dyspnea. She is on oxygen. She has trouble staying asleep at night due to nocturia. She is taking her medications. She is out of bed daily. She gets help with transfer. She gets around with her wheelchair. No fall reported.     Past Medical History:  Diagnosis Date  . Abnormality of gait   . Anemia, unspecified   . Arthritis   . Bronchiectasis without acute exacerbation ([address]) 04/10/2011   CT chest 2011   . Cataract 1990   Dr. Katy Fitch  . Cholelithiases   . Closed fracture of unspecified part of ramus of mandible   . COPD (chronic obstructive pulmonary disease) ([place])   . Coronary atherosclerosis of unspecified type of vessel, native or graft   . Disturbance of skin sensation   . DOE (dyspnea on exertion) 04/10/2011   PFTs 2013:  FEV1 1.04 (78%), ratio 64, couldn't do maneuver for lung volumes or dlco   . Edema   . Esophageal reflux   . Fall from other slipping, tripping, or stumbling   . Gastric ulcer with hemorrhage 2008  . Hypertension   . Hypoxemia   . Insomnia, unspecified   . Mucopurulent chronic bronchitis ([place])   . Nodule of neck 11/07/2013   Left neck posteriorly   . Nontoxic uninodular goiter   . Osteoarthrosis, unspecified whether generalized or localized, unspecified site   . Other and unspecified hyperlipidemia   . Other malaise and fatigue   . Pain in joint, lower leg 2010   right knee  . Pain in joint, shoulder region 2013   left  . Palpitations   . Pneumonia, organism unspecified(486)   . Regional enteritis of unspecified site   . Rosacea   . Sciatica   . Squamous cell carcinoma of skin of lower limb, including hip 07/27/2012   Nonhealing lesion of the left mid  calf medially   . Tachycardia 12/06/2014  . Tension headache   . Tension headache   . Type II or unspecified type diabetes mellitus without mention of complication, not stated as uncontrolled   . Unspecified venous (peripheral) insufficiency   . Urine, incontinence, stress female 07/19/2013   Past Surgical History:  Procedure Laterality Date  . EYE SURGERY Bilateral 1990   Dr. Katy Fitch  . GASTROJEJUNOSTOMY  05/2004   secondary to ulcer  . JOINT REPLACEMENT Bilateral 1993-1998   total knee replacement  . MOLE REMOVAL   05/11/14   left hand and left side of face [facility]  . OTHER SURGICAL HISTORY  2008   Ulcer surgery   . SMALL INTESTINE SURGERY    . TOTAL KNEE ARTHROPLASTY Bilateral 1993, 1998   knees    Allergies  Allergen Reactions  . Adhesive [Tape]     unknown  . Aspirin     unknown  . Augmentin [Amoxicillin-Pot Clavulanate]   . Codeine     unknown  . Diclofenac   . Enalapril Cough  . Hydrocodone     unknown  . Pantoprazole Sodium     unknown  . Voltaren [Diclofenac Sodium]     Outpatient Encounter Prescriptions as of 10/10/2016  Medication Sig  . acetaminophen (TYLENOL) 325 MG tablet Take 650 mg by mouth every 6 (six) hours as needed for moderate pain or headache.  Marland Kitchen acetaminophen (TYLENOL) 500 MG tablet Take 500 mg by mouth. Take one once daily in morning, take 2 at bedtime  . atorvastatin (LIPITOR) 10 MG tablet Take 10 mg by mouth at bedtime.   . carbamide peroxide (DEBROX) 6.5 % otic solution Place 5 drops into the left ear at bedtime. On the 12th, 13th and 14th of the month  . doxazosin (CARDURA) 8 MG tablet Take 8 mg by mouth daily. To control BP.  . furosemide (LASIX) 40 MG tablet Take 40 mg by mouth 2 (two) times daily. Hold for SBP <110  . insulin glargine (LANTUS) 100 UNIT/ML injection Inject 18 Units into the skin at bedtime.   . insulin lispro (HUMALOG) 100 UNIT/ML cartridge Inject 10 Units into the skin. At 1230 PM and 530 PM. (If blood sugar is 150-250 = 8 units, if blood sugar is greater than 250 = 12 units daily)  . ipratropium-albuterol (DUONEB) 0.5-2.5 (3) MG/3ML SOLN Take 3 mLs by nebulization 2 (two) times daily.   Marland Kitchen ipratropium-albuterol (DUONEB) 0.5-2.5 (3) MG/3ML SOLN Take 3 mLs by nebulization every 8 (eight) hours as needed. In addition to the scheduled dose  . loperamide (IMODIUM A-D) 2 MG tablet Take 4 mg by mouth as needed for diarrhea or loose stools. Give 4 mg as the initial dose. Then give 2 mg after each loose stool  . loratadine (CLARITIN) 10 MG  tablet Take 10 mg by mouth daily as needed for allergies.  Marland Kitchen losartan (COZAAR) 50 MG tablet Take 50 mg by mouth daily.  . metoprolol tartrate (LOPRESSOR) 25 MG tablet Take 25 mg by mouth 2 (two) times daily.  . Multiple Vitamins-Minerals (MULTIVITAMIN WITH MINERALS) tablet Take 1 tablet by mouth daily.  Marland Kitchen omeprazole (PRILOSEC) 20 MG capsule Take 20 mg by mouth daily.   . OXYGEN Inhale into the lungs. 3 liter via nasal canula  . predniSONE (DELTASONE) 5 MG tablet Take 5 mg by mouth daily with breakfast.  . rOPINIRole (REQUIP) 0.25 MG tablet Take 1 tablet (0.25 mg total) by mouth at bedtime. As needed  .  spironolactone (ALDACTONE) 25 MG tablet Take 12.5 mg by mouth daily.   . traZODone (DESYREL) 50 MG tablet Take 0.5 tablets (25 mg total) by mouth at bedtime.   No facility-administered encounter medications on file as of 10/10/2016.     Review of Systems  Constitutional: Negative for activity change, appetite change, chills and fever.  HENT: Negative for congestion, mouth sores, postnasal drip, rhinorrhea, sinus pressure, sore throat and trouble swallowing.   Eyes:       Wears glasses  Respiratory: Positive for cough, shortness of breath and wheezing.   Cardiovascular: Positive for leg swelling. Negative for chest pain and palpitations.  Gastrointestinal: Negative for abdominal pain, constipation, diarrhea, nausea and vomiting.  Genitourinary: Positive for frequency. Negative for dysuria and pelvic pain.  Musculoskeletal: Positive for back pain and gait problem.       Uses walker for short distance and uses her motorized scooter otherwise. No fall reported.   Skin: Negative for rash.  Neurological: Negative for dizziness, seizures, syncope and headaches.  Hematological: Bruises/bleeds easily.  Psychiatric/Behavioral: Positive for sleep disturbance. Negative for behavioral problems and decreased concentration.    Immunization History  Administered Date(s) Administered  . Influenza Split  11/06/2011, 11/04/2012  . Influenza,inj,Quad PF,6+ Mos 11/17/2013  . Influenza-Unspecified 11/09/2014, 11/16/2015, 11/25/2015  . PPD Test 06/19/2010, 11/29/2014  . Pneumococcal Conjugate-13 08/11/2014  . Pneumococcal Polysaccharide-23 02/04/2003  . Pneumococcal-Unspecified 06/07/2014  . Tdap 02/27/2013  . Zoster 02/03/2006   Pertinent  Health Maintenance Due  Topic Date Due  . INFLUENZA VACCINE  11/03/2016 (Originally 09/03/2016)  . DEXA SCAN  07/18/2026 (Originally 10/31/1984)  . HEMOGLOBIN A1C  01/09/2017  . FOOT EXAM  02/07/2017  . OPHTHALMOLOGY EXAM  07/22/2017  . PNA vac Low Risk Adult  Completed   Fall Risk  12/06/2014 07/11/2014 05/22/2014 11/07/2013 08/30/2013  Falls in the past year? Yes No Yes No No  Number falls in past yr: 1 - 1 - -  Injury with Fall? No - No - -  Risk for fall due to : History of fall(s);Impaired mobility - History of fall(s);Impaired balance/gait - Impaired balance/gait;Impaired mobility  Follow up - - Falls evaluation completed - -   Functional Status Survey:    Vitals:   10/10/16 1405  BP: (!) 152/78  Pulse: 76  Resp: 20  Temp: (!) 97 F (36.1 C)  TempSrc: Oral  SpO2: 96%  Weight: 211 lb (95.7 kg)  Height: 5\' 4"  (1.626 m)   Body mass index is 36.22 kg/m.   Wt Readings from Last 3 Encounters:  10/10/16 211 lb (95.7 kg)  09/10/16 211 lb (95.7 kg)  09/03/16 210 lb (95.3 kg)    Physical Exam  Constitutional: She is oriented to person, place, and time.  Obese, elderly female patient in no acute distress  HENT:  Head: Normocephalic and atraumatic.  Mouth/Throat: Oropharynx is clear and moist.  Eyes: Pupils are equal, round, and reactive to light. Conjunctivae are normal.  Neck: Normal range of motion. Neck supple. No thyromegaly present.  Cardiovascular: Normal rate and regular rhythm.   Pulmonary/Chest: No respiratory distress. She has wheezes. She has rales. She exhibits no tenderness.  Abdominal: Soft. Bowel sounds are normal. There is  no tenderness.  Musculoskeletal: She exhibits edema.  Trace leg edema with chronic skin changes, good dorsalis pedis, Can move all 4 extremities, on wheelchair  Lymphadenopathy:    She has no cervical adenopathy.  Neurological: She is alert and oriented to person, place, and time.  Skin: Skin  is warm and dry.  Psychiatric: She has a normal mood and affect. Her behavior is normal.    Labs reviewed:  Recent Labs  07/10/16 07/14/16 08/18/16  NA 141 139 139  K 3.9 4.1 3.9  BUN 43* 30* 35*  CREATININE 0.9 0.9 1.1    Recent Labs  03/13/16 07/10/16 08/18/16  AST 16 14 20   ALT 11 12 17   ALKPHOS 80 86 80    Recent Labs  11/26/15 03/13/16 07/10/16  WBC 7.7 7.0 8.1  HGB 10.2* 10.3* 9.8*  HCT 31* 32* 30*  PLT 221 184 168   Lab Results  Component Value Date   TSH 2.61 07/10/2016   Lab Results  Component Value Date   HGBA1C 7.6 07/10/2016   Lab Results  Component Value Date   CHOL 96 08/18/2016   HDL 58 08/18/2016   LDLCALC 22 08/18/2016   TRIG 82 08/18/2016    Significant Diagnostic Results in last 30 days:  No results found.  Assessment/Plan  Hypertension Overall stable BP. Few SBP in 150s. Continue losartan 50 mg daily, furosemide 40 mg bid, metoprolol tartrate 25 mg bid, spironolactone 12.5 mg daily with doxazosin 8 mg daily. Change losartan to 75 mg daily. doxazosin can cause incresed urinary frequency. decrease doxazosin to 4 mg daily x 1 week and then 2 mg daily x 1 week and stop. Monitor clinically. Check BMP  Chronic systolic CHF On oxygen by nasal canula. Continue losartan but change from 50 mg to 75 mg daily, metoprolol tartrate 25 mg bid, spironolactone 12.5 mg daily, furosemide 40 mg bid. Weight stable.   Hyperlipidemia Lipid Panel     Component Value Date/Time   CHOL 96 08/18/2016   TRIG 82 08/18/2016   HDL 58 08/18/2016   LDLCALC 22 08/18/2016   LDL at goal. Currently on atorvastatin. Given her age and medical co-morbidities, the benefit of  statin is limited. Discontinue atorvastatin.  COPD On chronic prednisone 5 mg daily, duoneb q6h prn. With her wheezing place her on advair bid for now and monitor. Continue oxygen.   Insomnia Interrupted sleep mainly with her urinary problem, nocturia. Continue trazodone 25 mg daily. Advised not to drink water post supper and to empty her bladder before going to bed. See above.  OAB With nocturia interrupting her sleep. Her doxazosin could be contributing some, taper as above. If taking her off doxazosin wont help, consider myrbetriq.    Family/ staff Communication: reviewed care plan with patient and charge nurse.    Labs/tests ordered: bmp in 3 months Blanchie Serve, MD Internal Medicine [facility] [id number] [place], [place] [address] Cell Phone (Monday-Friday 8 am - 5 pm): [phone] On Call: [phone] and follow prompts after 5 pm and on weekends Office Phone: [phone] Office Fax: [phone]

## 2016-10-14 DIAGNOSIS — L570 Actinic keratosis: Secondary | ICD-10-CM | POA: Diagnosis not present

## 2016-10-14 DIAGNOSIS — L821 Other seborrheic keratosis: Secondary | ICD-10-CM | POA: Diagnosis not present

## 2016-10-14 DIAGNOSIS — D485 Neoplasm of uncertain behavior of skin: Secondary | ICD-10-CM | POA: Diagnosis not present

## 2016-10-14 IMAGING — CR DG CHEST 2V
2 series · 2 of 2 positions shown · non-contrast
Comparison: 11/14/2013

CLINICAL DATA: Cough, congestion, low-grade fever for 10 days.

EXAM:
CHEST  2 VIEW

[w chest lat]
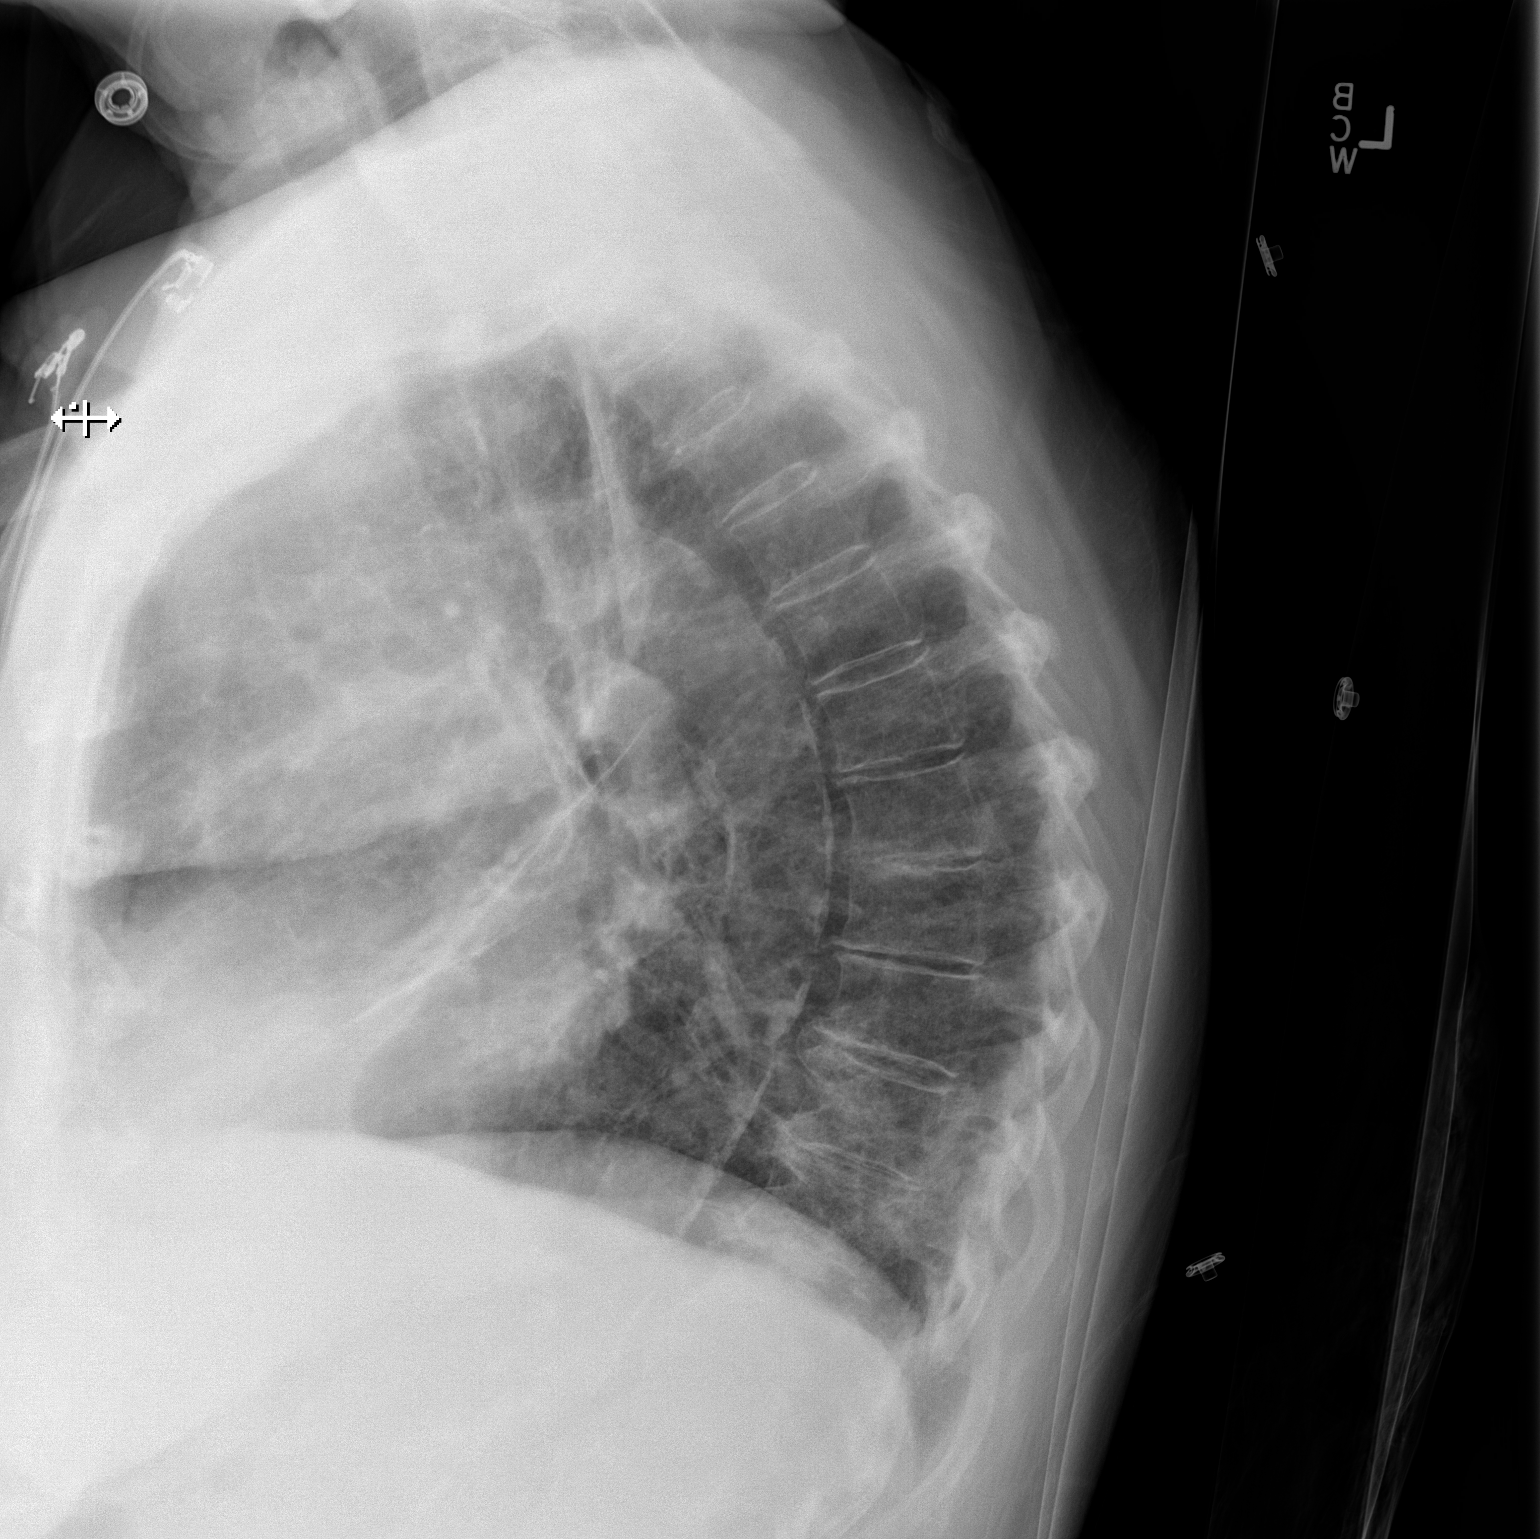

[x chest ap]
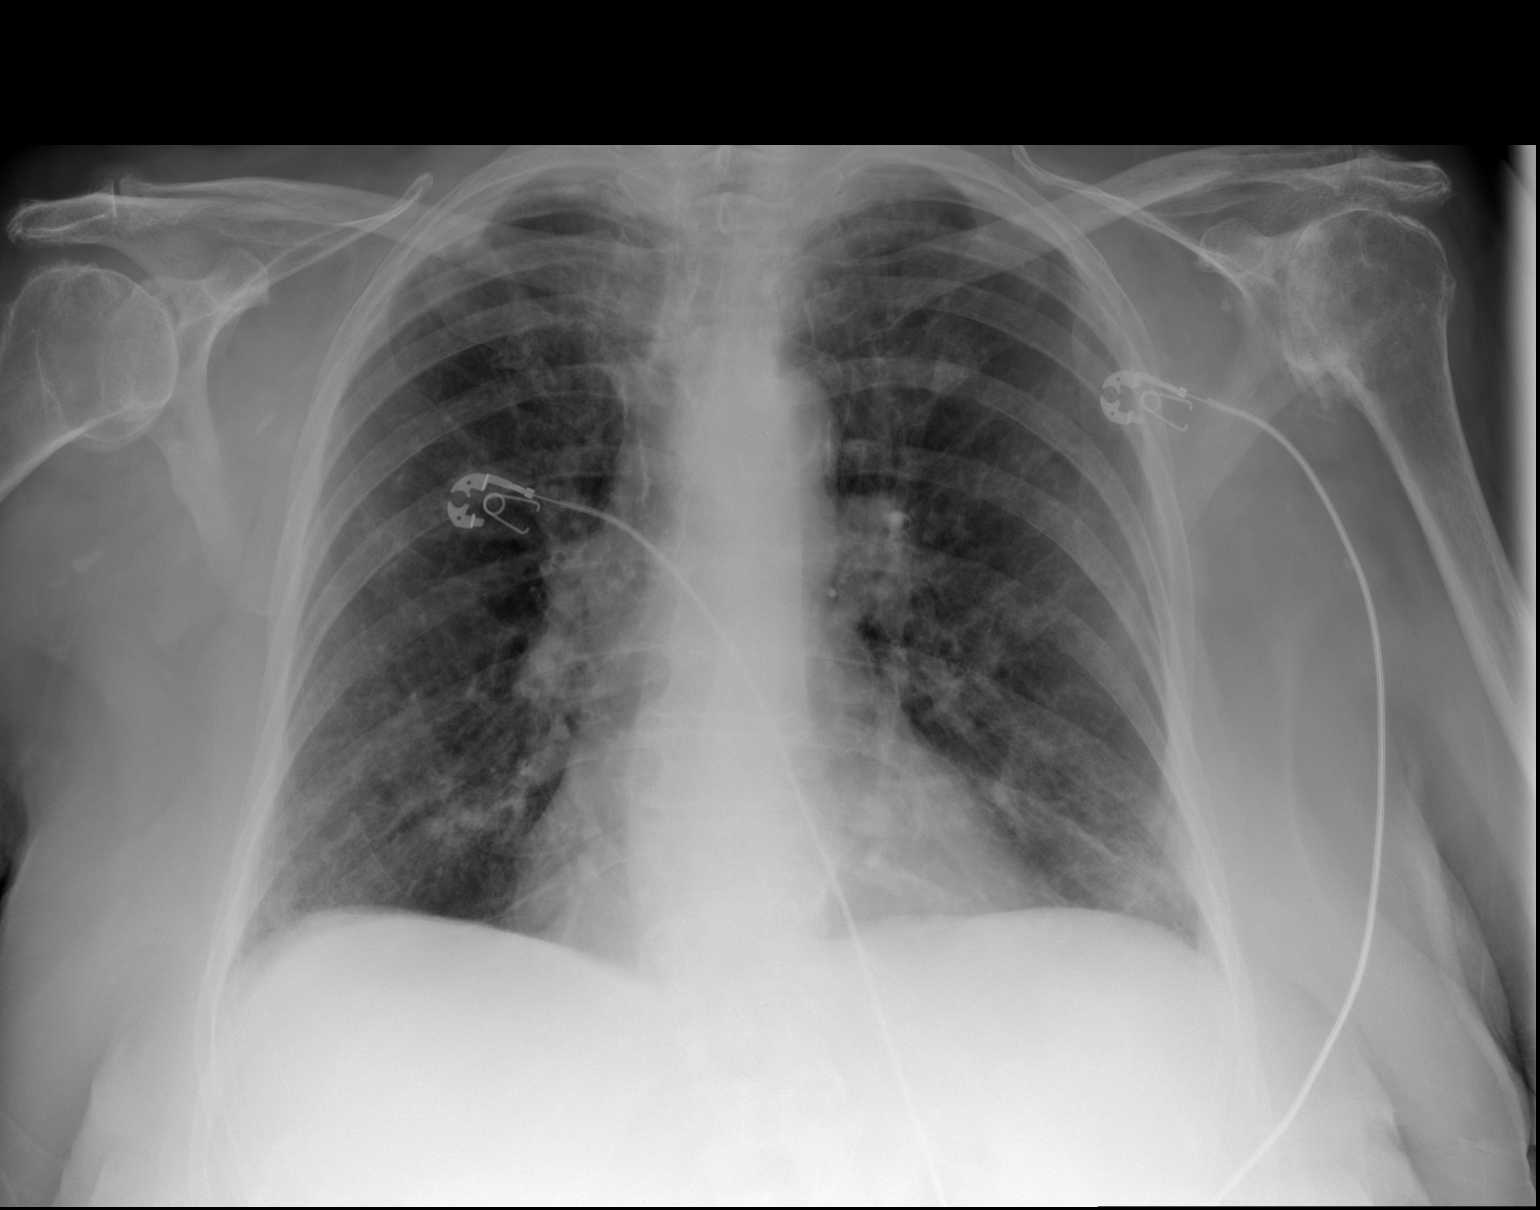

[2 of 2 positions shown; findings below may reference images not displayed]

FINDINGS: Interstitial prominence throughout the lungs has improved since
prior study. Residual interstitial changes likely reflects
underlying chronic lung disease. No confluent opacities or
effusions. Heart is normal size. Diffuse calcifications throughout
the aorta. Degenerative changes in the shoulders, left greater than
right.
IMPRESSION: Improving interstitial prominence with mild persistent interstitial
prominence, likely chronic interstitial disease.

## 2016-10-15 DIAGNOSIS — D0462 Carcinoma in situ of skin of left upper limb, including shoulder: Secondary | ICD-10-CM | POA: Diagnosis not present

## 2016-10-21 ENCOUNTER — Other Ambulatory Visit: Payer: Self-pay | Admitting: *Deleted

## 2016-10-21 ENCOUNTER — Telehealth: Payer: Self-pay | Admitting: *Deleted

## 2016-10-21 NOTE — Telephone Encounter (Signed)
PA initiated for Losartan 25mg  tabs due to quantity override. Patient changed to 75mg  and no 75mg  tab available. Only comes in 100, 50 or 25 mg tabs. Submitted through Cover My Meds. Will await determination.

## 2016-10-29 ENCOUNTER — Encounter: Payer: Self-pay | Admitting: Family

## 2016-10-29 ENCOUNTER — Non-Acute Institutional Stay (SKILLED_NURSING_FACILITY): Payer: Medicare Other | Admitting: Family

## 2016-10-29 DIAGNOSIS — R229 Localized swelling, mass and lump, unspecified: Secondary | ICD-10-CM

## 2016-10-29 NOTE — Progress Notes (Signed)
Location:  [facility] Room Number: [id number] Place of Service:  SNF [phone]) Provider: Rishi Vicario FNP-C  Blanchie Serve, MD  Patient Care Team: Blanchie Serve, MD as PCP - General (Internal Medicine) Clent Jacks, MD (Ophthalmology) Adrian Prows, MD as Attending Physician (Cardiology) Buckman, [facility] [name], MD as Consulting Physician (Orthopedic Surgery) Clance, Armando Reichert, MD as Consulting Physician (Pulmonary Disease) Tamla Winkels, Nelda Bucks, NP as Nurse Practitioner [facility] Medicine)  Extended Emergency Contact Information Primary Emergency Contact: Silveria,[name] Address: [address]          [place], [place] [address] [name] of [place] Phone: [phone] Mobile Phone: [phone] Relation: Son Secondary Emergency Contact: [name] [place] of [place] Mobile Phone: [phone] Relation: Daughter  Code Status:  DNR Goals of care: Advanced Directive information Advanced Directives 10/29/2016  Does Patient Have a Medical Advance Directive? Yes  Type of Advance Directive Out of facility DNR (pink MOST or yellow form);Living will;Healthcare Power of Attorney  Does patient want to make changes to medical advance directive? -  Copy of [place] in Chart? Yes  Pre-existing out of facility DNR order (yellow form or pink MOST form) Yellow form placed in chart (order not valid for inpatient use)     Chief Complaint  Patient presents with  . Acute Visit    right hand is tender to touch and has a nodule    HPI:  Pt is a 83 y.o. 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     Past Medical History:  Diagnosis Date  . Abnormality of gait   . Anemia, unspecified   . Arthritis   . Bronchiectasis without acute exacerbation ([place]) 04/10/2011   CT chest 2011   . Cataract 1990   Dr. Katy Fitch  . Cholelithiases   . Closed fracture of unspecified part of ramus of mandible   . COPD (chronic obstructive pulmonary disease) ([place])   . Coronary atherosclerosis of unspecified type of vessel, native or graft   . Disturbance of skin sensation   . DOE (dyspnea on exertion) 04/10/2011   PFTs 2013:  FEV1 1.04 (78%), ratio 64, couldn't do maneuver for lung volumes or dlco   . Edema   . Esophageal reflux   . Fall from other slipping, tripping, or stumbling   . Gastric ulcer with hemorrhage 2008  . Hypertension   . Hypoxemia   . Insomnia, unspecified   . Mucopurulent chronic bronchitis ([place])   . Nodule of neck 11/07/2013   Left neck posteriorly   . Nontoxic uninodular goiter   . Osteoarthrosis, unspecified whether generalized or localized, unspecified site   . Other and unspecified hyperlipidemia   . Other malaise and fatigue   . Pain in joint, lower leg 2010   right knee  . Pain in joint, shoulder region 2013   left  . Palpitations   . Pneumonia, organism unspecified(486)   . Regional enteritis of unspecified site   . Rosacea   . Sciatica   . Squamous cell carcinoma of skin of lower limb, including hip 07/27/2012  Nonhealing lesion of the left mid calf medially   . Tachycardia 12/06/2014  . Tension headache   . Tension headache   . Type II or unspecified type diabetes mellitus without mention of complication, not stated as uncontrolled   . Unspecified venous (peripheral) insufficiency   . Urine, incontinence, stress female 07/19/2013   Past Surgical History:  Procedure Laterality Date  . EYE SURGERY Bilateral 1990   Dr. Katy Fitch  . GASTROJEJUNOSTOMY  05/2004   secondary to ulcer  . JOINT  REPLACEMENT Bilateral 1993-1998   total knee replacement  . MOLE REMOVAL  05/11/14   left hand and left side of face [facility]  . OTHER SURGICAL HISTORY  2008   Ulcer surgery   . SMALL INTESTINE SURGERY    . TOTAL KNEE ARTHROPLASTY Bilateral 1993, 1998   knees    Allergies  Allergen Reactions  . Adhesive [Tape]     unknown  . Aspirin     unknown  . Augmentin [Amoxicillin-Pot Clavulanate]   . Codeine     unknown  . Diclofenac   . Enalapril Cough  . Hydrocodone     unknown  . Pantoprazole Sodium     unknown  . Voltaren [Diclofenac Sodium]     Outpatient Encounter Prescriptions as of 10/29/2016  Medication Sig  . acetaminophen (TYLENOL) 325 MG tablet Take 650 mg by mouth every 6 (six) hours as needed for moderate pain or headache.  Marland Kitchen acetaminophen (TYLENOL) 500 MG tablet Take 500 mg by mouth. Take one once daily in morning, take 2 at bedtime  . carbamide peroxide (DEBROX) 6.5 % otic solution Place 5 drops into the left ear at bedtime. On the 12th, 13th and 14th of the month  . doxazosin (CARDURA) 8 MG tablet Take 8 mg by mouth daily. To control BP.  Marland Kitchen Fluticasone-Salmeterol (ADVAIR) 100-50 MCG/DOSE AEPB Inhale 1 puff into the lungs 2 (two) times daily.  . furosemide (LASIX) 40 MG tablet Take 40 mg by mouth 2 (two) times daily. Hold for SBP <110  . guaiFENesin (MUCINEX) 600 MG 12 hr tablet Take 600 mg by mouth 2 (two) times daily.  Marland Kitchen guaiFENesin-dextromethorphan (ROBITUSSIN DM) 100-10 MG/5ML syrup Take 10 mLs by mouth every 6 (six) hours as needed for cough. For 48 hours as needed  . insulin glargine (LANTUS) 100 UNIT/ML injection Inject 18 Units into the skin at bedtime.   . insulin lispro (HUMALOG) 100 UNIT/ML cartridge Inject 10 Units into the skin. At 1230 PM and 530 PM. (If blood sugar is 150-250 = 8 units, if blood sugar is greater than 250 = 12 units daily)  . ipratropium-albuterol (DUONEB) 0.5-2.5 (3) MG/3ML SOLN Take 3 mLs by nebulization 2 (two) times daily.   Marland Kitchen  ipratropium-albuterol (DUONEB) 0.5-2.5 (3) MG/3ML SOLN Take 3 mLs by nebulization every 8 (eight) hours as needed. In addition to the scheduled dose  . loperamide (IMODIUM A-D) 2 MG tablet Take 4 mg by mouth as needed for diarrhea or loose stools. Give 4 mg as the initial dose. Then give 2 mg after each loose stool  . loratadine (CLARITIN) 10 MG tablet Take 10 mg by mouth daily as needed for allergies.  Marland Kitchen losartan (COZAAR) 50 MG tablet Take 75 mg by mouth daily. Hold if SBP< 110  . metoprolol tartrate (LOPRESSOR) 25 MG tablet Take 25 mg by mouth 2 (two) times daily.  . Multiple Vitamins-Minerals (MULTIVITAMIN WITH MINERALS) tablet Take 1 tablet by mouth daily.  Marland Kitchen omeprazole (PRILOSEC) 20 MG  capsule Take 20 mg by mouth daily.   . OXYGEN Inhale into the lungs. 3 liter via nasal canula  . predniSONE (DELTASONE) 5 MG tablet Take 5 mg by mouth daily with breakfast.  . rOPINIRole (REQUIP) 0.25 MG tablet Take 1 tablet (0.25 mg total) by mouth at bedtime. As needed  . spironolactone (ALDACTONE) 25 MG tablet Take 12.5 mg by mouth daily.   . traZODone (DESYREL) 50 MG tablet Take 0.5 tablets (25 mg total) by mouth at bedtime.  . [DISCONTINUED] atorvastatin (LIPITOR) 10 MG tablet Take 10 mg by mouth at bedtime.    No facility-administered encounter medications on file as of 10/29/2016.     Review of Systems  Constitutional: Negative for activity change, appetite change, chills, fatigue and fever.  Respiratory: Negative for chest tightness and wheezing.        Chronic cough wears continuous oxygen via nasal cannula.   Cardiovascular: Positive for leg swelling. Negative for chest pain and palpitations.  Gastrointestinal: Negative for abdominal distention, abdominal pain, constipation and diarrhea.  Musculoskeletal: Positive for gait problem.       Uses wheelchair   Skin: Negative for color change, pallor and rash.       Right elbow skin nodule per HPI   Hematological: Does not bruise/bleed easily.    Psychiatric/Behavioral: Negative for agitation, confusion and sleep disturbance. The patient is not nervous/anxious.     Immunization History  Administered Date(s) Administered  . Influenza Split 11/06/2011, 11/04/2012  . Influenza,inj,Quad PF,6+ Mos 11/17/2013  . Influenza-Unspecified 11/09/2014, 11/16/2015, 11/25/2015  . PPD Test 06/19/2010, 11/29/2014  . Pneumococcal Conjugate-13 08/11/2014  . Pneumococcal Polysaccharide-23 02/04/2003  . Pneumococcal-Unspecified 06/07/2014  . Tdap 02/27/2013  . Zoster 02/03/2006   Pertinent  Health Maintenance Due  Topic Date Due  . INFLUENZA VACCINE  11/03/2016 (Originally 09/03/2016)  . DEXA SCAN  07/18/2026 (Originally 10/31/1984)  . HEMOGLOBIN A1C  01/09/2017  . FOOT EXAM  02/07/2017  . OPHTHALMOLOGY EXAM  07/22/2017  . PNA vac Low Risk Adult  Completed   Fall Risk  12/06/2014 07/11/2014 05/22/2014 11/07/2013 08/30/2013  Falls in the past year? Yes No Yes No No  Number falls in past yr: 1 - 1 - -  Injury with Fall? No - No - -  Risk for fall due to : History of fall(s);Impaired mobility - History of fall(s);Impaired balance/gait - Impaired balance/gait;Impaired mobility  Follow up - - Falls evaluation completed - -    Vitals:   10/29/16 1013  BP: (!) 147/54  Pulse: 74  Resp: 18  Temp: 97.6 F (36.4 C)  SpO2: 98%  Weight: 214 lb (97.1 kg)  Height: 5\' 4"  (1.626 m)   Body mass index is 36.73 kg/m. Physical Exam  Constitutional: She is oriented to person, place, and time. She appears well-developed and well-nourished. No distress.  Elderly in no acute distress   HENT:  Head: Normocephalic.  Mouth/Throat: Oropharynx is clear and moist. No oropharyngeal exudate.  Eyes: Pupils are equal, round, and reactive to light. Conjunctivae are normal. Right eye exhibits no discharge. Left eye exhibits no discharge. No scleral icterus.  Neck: Normal range of motion. No JVD present. No thyromegaly present.  Cardiovascular: Normal rate, regular  rhythm, normal heart sounds and intact distal pulses.  Exam reveals no gallop and no friction rub.   No murmur heard. Pulmonary/Chest: Effort normal and breath sounds normal. No respiratory distress. She has no wheezes. She has no rales.  Oxygen via nasal cannula in place   Abdominal:  Soft. Bowel sounds are normal. She exhibits no distension. There is no tenderness. There is no rebound and no guarding.  Lymphadenopathy:    She has no cervical adenopathy.  Neurological: She is oriented to person, place, and time. Coordination normal.  Skin: Skin is warm and dry. No rash noted. No pallor.  Right elbow quarter size raised red and tender to touch nodule without any drainage.Surrounding skin tissue without any signs of infections.   Psychiatric: She has a normal mood and affect.    Labs reviewed:  Recent Labs  07/10/16 07/14/16 08/18/16  NA 141 139 139  K 3.9 4.1 3.9  BUN 43* 30* 35*  CREATININE 0.9 0.9 1.1    Recent Labs  03/13/16 07/10/16 08/18/16  AST 16 14 20   ALT 11 12 17   ALKPHOS 80 86 80    Recent Labs  11/26/15 03/13/16 07/10/16  WBC 7.7 7.0 8.1  HGB 10.2* 10.3* 9.8*  HCT 31* 32* 30*  PLT 221 184 168   Lab Results  Component Value Date   TSH 2.61 07/10/2016   Lab Results  Component Value Date   HGBA1C 7.6 07/10/2016   Lab Results  Component Value Date   CHOL 96 08/18/2016   HDL 58 08/18/2016   LDLCALC 22 08/18/2016   TRIG 82 08/18/2016    Significant Diagnostic Results in last 30 days:  No results found.  Assessment/Plan   Skin nodule Afebrile.Right elbow quarter size raised red and tender to touch nodule without any drainage.Surrounding skin tissue without any signs of infections.Facility Nurse to cleanse with saline,pat dry and continue to cover with gauze.Refer to dermatology for evaluation. Patient has upcoming appointment in 11/12/2016 for follow up left inferior lateral deltoid biopsy site. Spoke with Nurse to obtain a sooner appointment. Continue  to monitor for any signs of infections.    Family/ staff Communication: Reviewed plan of care with patient and facility Nurse  Labs/tests ordered: None   Nelda Bucks Carla Pogosyan, NP

## 2016-11-10 ENCOUNTER — Encounter: Payer: Self-pay | Admitting: Family

## 2016-11-10 ENCOUNTER — Non-Acute Institutional Stay (SKILLED_NURSING_FACILITY): Payer: Medicare Other | Admitting: Family

## 2016-11-10 DIAGNOSIS — E1122 Type 2 diabetes mellitus with diabetic chronic kidney disease: Secondary | ICD-10-CM | POA: Diagnosis not present

## 2016-11-10 DIAGNOSIS — I5042 Chronic combined systolic (congestive) and diastolic (congestive) heart failure: Secondary | ICD-10-CM | POA: Diagnosis not present

## 2016-11-10 DIAGNOSIS — I130 Hypertensive heart and chronic kidney disease with heart failure and stage 1 through stage 4 chronic kidney disease, or unspecified chronic kidney disease: Secondary | ICD-10-CM

## 2016-11-10 DIAGNOSIS — J449 Chronic obstructive pulmonary disease, unspecified: Secondary | ICD-10-CM

## 2016-11-10 DIAGNOSIS — N183 Chronic kidney disease, stage 3 (moderate): Secondary | ICD-10-CM

## 2016-11-10 DIAGNOSIS — I129 Hypertensive chronic kidney disease with stage 1 through stage 4 chronic kidney disease, or unspecified chronic kidney disease: Secondary | ICD-10-CM

## 2016-11-10 NOTE — Progress Notes (Signed)
Location:  [place] Room Number: 16 Place of Service:  SNF ([phone]) Provider:  Marae Cottrell, Nelda Bucks  NP  Blanchie Serve, MD  Patient Care Team: Blanchie Serve, MD as PCP - General (Internal Medicine) Clent Jacks, MD (Ophthalmology) Adrian Prows, MD as Attending Physician (Cardiology) Witt, [facility] [name], MD as Consulting Physician (Orthopedic Surgery) Clance, Armando Reichert, MD as Consulting Physician (Pulmonary Disease) Dalya Maselli, Nelda Bucks, NP as Nurse Practitioner [facility])  Extended Emergency Contact Information Primary Emergency Contact: Schorsch,[name] Address: [address]          [place], [place] [address] [name] of [place] Phone: [phone] Mobile Phone: [phone] Relation: Son Secondary Emergency Contact: [name] [place] Mobile Phone: [phone] Relation: Daughter  Code Status:  DNR Goals of care: Advanced Directive information Advanced Directives 11/10/2016  Does Patient Have a Medical Advance Directive? Yes  Type of Paramedic of [place];Living will;Out of facility DNR (pink MOST or yellow form)  Does patient want to make changes to medical advance directive? No - Patient declined  Copy of [place] in Chart? Yes  Pre-existing out of facility DNR order (yellow form or pink MOST form) Yellow form placed in chart (order not valid for inpatient use)     Chief Complaint  Patient presents with  . Medical Management of Chronic Issues    HPI:  Pt is a 83 y.o. female seen today for medical management of chronic diseases.she has a medical history of HTN, CHF, Type 2 DM,CKD stage 3,OAB,Anemia, Hyperlipidemia among other conditions. She is seen in her room today. She denies any acute issues this visit.No recent fall episodes.Facility nurse reports no new concerns. She continue to require assistance with ADL . CBG readings in the 130's-150's in the morning and  90-300's in the afternoon.     Past Medical History:  Diagnosis Date  . Abnormality of gait   . Anemia, unspecified   . Arthritis   . Bronchiectasis without acute exacerbation ([place]) 04/10/2011   CT chest 2011   . Cataract 1990   Dr. Katy Fitch  . Cholelithiases   . Closed fracture of unspecified part of ramus of mandible   . COPD (chronic obstructive pulmonary disease) ([place])   . Coronary atherosclerosis of unspecified type of vessel, native or graft   . Disturbance of skin sensation   . DOE (dyspnea on exertion) 04/10/2011   PFTs 2013:  FEV1 1.04 (78%), ratio 64, couldn't do maneuver for lung volumes or dlco   . Edema   . Esophageal reflux   . Fall from other slipping, tripping, or stumbling   . Gastric ulcer with hemorrhage 2008  . Hypertension   . Hypoxemia   . Insomnia, unspecified   . Mucopurulent chronic bronchitis ([place])   . Nodule of neck 11/07/2013   Left neck posteriorly   . Nontoxic uninodular goiter   . Osteoarthrosis, unspecified whether generalized or localized, unspecified site   . Other and unspecified hyperlipidemia   . Other malaise and fatigue   . Pain in joint, lower leg 2010   right knee  . Pain in joint, shoulder region 2013   left  . Palpitations   . Pneumonia, organism unspecified(486)   . Regional enteritis of unspecified site   . Rosacea   . Sciatica   . Squamous cell carcinoma of skin of lower limb, including hip 07/27/2012   Nonhealing lesion of the left mid calf medially   .  Tachycardia 12/06/2014  . Tension headache   . Tension headache   . Type II or unspecified type diabetes mellitus without mention of complication, not stated as uncontrolled   . Unspecified venous (peripheral) insufficiency   . Urine, incontinence, stress female 07/19/2013   Past Surgical History:  Procedure Laterality Date  . EYE SURGERY Bilateral 1990   Dr. Katy Fitch  . GASTROJEJUNOSTOMY  05/2004   secondary to ulcer  . JOINT REPLACEMENT Bilateral 1993-1998   total knee  replacement  . MOLE REMOVAL  05/11/14   left hand and left side of face [facility]  . OTHER SURGICAL HISTORY  2008   Ulcer surgery   . SMALL INTESTINE SURGERY    . TOTAL KNEE ARTHROPLASTY Bilateral 1993, 1998   knees    Allergies  Allergen Reactions  . Adhesive [Tape]     unknown  . Aspirin     unknown  . Augmentin [Amoxicillin-Pot Clavulanate]   . Codeine     unknown  . Diclofenac   . Enalapril Cough  . Hydrocodone     unknown  . Pantoprazole Sodium     unknown  . Voltaren [Diclofenac Sodium]     Outpatient Encounter Prescriptions as of 11/10/2016  Medication Sig  . acetaminophen (TYLENOL) 325 MG tablet Take 650 mg by mouth every 6 (six) hours as needed for moderate pain or headache.  Marland Kitchen acetaminophen (TYLENOL) 500 MG tablet Take 500 mg by mouth. Take one once daily in morning, take 2 at bedtime  . carbamide peroxide (DEBROX) 6.5 % otic solution Place 5 drops into the left ear at bedtime. On the 12th, 13th and 14th of the month  . Fluticasone-Salmeterol (ADVAIR) 100-50 MCG/DOSE AEPB Inhale 1 puff into the lungs 2 (two) times daily.  . furosemide (LASIX) 40 MG tablet Take 40 mg by mouth 2 (two) times daily. Hold for SBP <110  . guaiFENesin (MUCINEX) 600 MG 12 hr tablet Take 600 mg by mouth 2 (two) times daily.  Marland Kitchen guaiFENesin-dextromethorphan (ROBITUSSIN DM) 100-10 MG/5ML syrup Take 10 mLs by mouth every 6 (six) hours as needed for cough. For 48 hours as needed  . insulin glargine (LANTUS) 100 UNIT/ML injection Inject 18 Units into the skin at bedtime.   . insulin lispro (HUMALOG) 100 UNIT/ML cartridge Inject 10 Units into the skin. At 1230 PM and 530 PM. (If blood sugar is 150-250 = 8 units, if blood sugar is greater than 250 = 12 units daily)  . ipratropium-albuterol (DUONEB) 0.5-2.5 (3) MG/3ML SOLN Take 3 mLs by nebulization every 8 (eight) hours as needed. In addition to the scheduled dose  . loperamide (IMODIUM A-D) 2 MG tablet Take 4 mg by mouth as needed for  diarrhea or loose stools. Give 4 mg as the initial dose. Then give 2 mg after each loose stool  . loratadine (CLARITIN) 10 MG tablet Take 10 mg by mouth daily as needed for allergies.  Marland Kitchen losartan (COZAAR) 50 MG tablet Take 75 mg by mouth daily. Hold if SBP< 110  . metoprolol tartrate (LOPRESSOR) 25 MG tablet Take 25 mg by mouth 2 (two) times daily.  . Multiple Vitamins-Minerals (MULTIVITAMIN WITH MINERALS) tablet Take 1 tablet by mouth daily.  Marland Kitchen omeprazole (PRILOSEC) 20 MG capsule Take 20 mg by mouth daily.   . OXYGEN Inhale into the lungs. 3 liter via nasal canula  . predniSONE (DELTASONE) 5 MG tablet Take 5 mg by mouth daily with breakfast.  . rOPINIRole (REQUIP) 0.25 MG tablet Take 1 tablet (  0.25 mg total) by mouth at bedtime. As needed  . spironolactone (ALDACTONE) 25 MG tablet Take 12.5 mg by mouth daily.   . traZODone (DESYREL) 50 MG tablet Take 0.5 tablets (25 mg total) by mouth at bedtime.  . [DISCONTINUED] doxazosin (CARDURA) 8 MG tablet Take 8 mg by mouth daily. To control BP.  . [DISCONTINUED] ipratropium-albuterol (DUONEB) 0.5-2.5 (3) MG/3ML SOLN Take 3 mLs by nebulization 2 (two) times daily.    No facility-administered encounter medications on file as of 11/10/2016.     Review of Systems  Constitutional: Negative for activity change, appetite change, chills, fatigue and fever.  HENT: Positive for hearing loss. Negative for congestion, rhinorrhea, sinus pain, sinus pressure, sneezing and sore throat.   Eyes: Positive for visual disturbance. Negative for discharge, redness and itching.       Wears glasses   Respiratory: Positive for cough. Negative for chest tightness and wheezing.   Cardiovascular: Positive for leg swelling. Negative for chest pain and palpitations.  Gastrointestinal: Negative for abdominal distention, abdominal pain, constipation, diarrhea, nausea and vomiting.  Endocrine: Negative for cold intolerance, heat intolerance, polydipsia, polyphagia and polyuria.    Genitourinary: Negative for dysuria, flank pain, frequency and urgency.  Musculoskeletal: Positive for arthralgias and gait problem.  Skin: Negative for color change, pallor, rash and wound.       Right elbow lesion awaiting dermatology evaluation.   Neurological: Negative for dizziness, seizures, syncope, light-headedness and headaches.  Hematological: Does not bruise/bleed easily.  Psychiatric/Behavioral: Negative for agitation, confusion and sleep disturbance.    Immunization History  Administered Date(s) Administered  . Influenza Split 11/06/2011, 11/04/2012  . Influenza,inj,Quad PF,6+ Mos 11/17/2013  . Influenza-Unspecified 11/09/2014, 11/16/2015, 11/25/2015  . PPD Test 06/19/2010, 11/29/2014  . Pneumococcal Conjugate-13 08/11/2014  . Pneumococcal Polysaccharide-23 02/04/2003  . Pneumococcal-Unspecified 06/07/2014  . Tdap 02/27/2013  . Zoster 02/03/2006   Pertinent  Health Maintenance Due  Topic Date Due  . INFLUENZA VACCINE  09/03/2016  . DEXA SCAN  07/18/2026 (Originally 10/31/1984)  . HEMOGLOBIN A1C  01/09/2017  . FOOT EXAM  02/07/2017  . OPHTHALMOLOGY EXAM  07/22/2017  . PNA vac Low Risk Adult  Completed   Fall Risk  12/06/2014 07/11/2014 05/22/2014 11/07/2013 08/30/2013  Falls in the past year? Yes No Yes No No  Number falls in past yr: 1 - 1 - -  Injury with Fall? No - No - -  Risk for fall due to : History of fall(s);Impaired mobility - History of fall(s);Impaired balance/gait - Impaired balance/gait;Impaired mobility  Follow up - - Falls evaluation completed - -    Vitals:   11/10/16 1347  BP: (!) 172/74  Pulse: 68  Resp: 20  Temp: 97.8 F (36.6 C)  Weight: 208 lb (94.3 kg)  Height: 5\' 4"  (1.626 m)   Body mass index is 35.7 kg/m. Physical Exam  Constitutional: She is oriented to person, place, and time. She appears well-developed and well-nourished. No distress.  Elderly in no acute distress   HENT:  Head: Normocephalic.  Right Ear: External ear normal.   Left Ear: External ear normal.  Mouth/Throat: Oropharynx is clear and moist. No oropharyngeal exudate.  Hearing aids in place   Eyes: Pupils are equal, round, and reactive to light. Conjunctivae and EOM are normal. Right eye exhibits no discharge. Left eye exhibits no discharge. No scleral icterus.  Eye glasses in place   Neck: Normal range of motion. No JVD present. No thyromegaly present.  Cardiovascular: Normal rate, regular rhythm, normal heart sounds  and intact distal pulses.  Exam reveals no gallop and no friction rub.   No murmur heard. Pulmonary/Chest: Effort normal and breath sounds normal. No respiratory distress. She has no wheezes. She has no rales. She exhibits no tenderness.  Oxygen via nasal cannula in place   Abdominal: Soft. Bowel sounds are normal. She exhibits no distension. There is no tenderness. There is no rebound and no guarding.  Musculoskeletal:  Uses wheelchair and scooter for long distance. Bilateral lower extremities trace edema. Knee high ted hose in place.   Lymphadenopathy:    She has no cervical adenopathy.  Neurological: She is oriented to person, place, and time. Coordination normal.  Skin: Skin is warm and dry. No rash noted. No erythema. No pallor.  Brownish skin discoloration to lower extremities.   Psychiatric: She has a normal mood and affect.    Labs reviewed:  Recent Labs  07/10/16 07/14/16 08/18/16  NA 141 139 139  K 3.9 4.1 3.9  BUN 43* 30* 35*  CREATININE 0.9 0.9 1.1    Recent Labs  03/13/16 07/10/16 08/18/16  AST 16 14 20   ALT 11 12 17   ALKPHOS 80 86 80    Recent Labs  11/26/15 03/13/16 07/10/16  WBC 7.7 7.0 8.1  HGB 10.2* 10.3* 9.8*  HCT 31* 32* 30*  PLT 221 184 168   Lab Results  Component Value Date   TSH 2.61 07/10/2016   Lab Results  Component Value Date   HGBA1C 7.6 07/10/2016   Lab Results  Component Value Date   CHOL 96 08/18/2016   HDL 58 08/18/2016   LDLCALC 22 08/18/2016   TRIG 82 08/18/2016     Significant Diagnostic Results in last 30 days:  No results found.  Assessment/Plan HTN  SBP elevated this visit B/p log reviewed B/p readings in the 120's/60's -150's/70's with occasional elevated SBP.continue on spironolactone 12.5 mg tablet daily,metoprolol 25 mg tablet twice daily, Losartan 75 mg tablet daily and furosemide 40 mg tablet twice daily. Continue to monitor B/p. Will increase spironolactone to 25 mg tablet daily if SBP continues to be elevated.    Type 2 DM  Lab Results  Component Value Date   HGBA1C 7.6 07/10/2016  CBG readings in the 130's-150's in the morning and 90-300's in the afternoon. Increase Lantus to 12 units SQ twice daily.continue on Humalog 10 units three times with meals. Continue to monitor.    COPD Breathing stable.continue on Duoneb treatment  Every 8 hours as needed,Advair 100-48mcg inhaler, mucinex 600 mg tablet twice daily, loratadine 10 mg tablet and continuous oxygen via nasal cannula.  CHF  No recent abrupt weight changes.Lower extremities trace edema.Continue spironolactone 12.5 mg tablet daily,metoprolol 25 mg tablet twice daily, Losartan 75 mg tablet daily and furosemide 40 mg tablet twice daily.monitor weight.     Family/ staff Communication: reviewed plan of care with patient and facility Nurse.   Labs/tests ordered: None

## 2016-11-12 DIAGNOSIS — L905 Scar conditions and fibrosis of skin: Secondary | ICD-10-CM | POA: Diagnosis not present

## 2016-11-12 DIAGNOSIS — L814 Other melanin hyperpigmentation: Secondary | ICD-10-CM | POA: Diagnosis not present

## 2016-11-12 DIAGNOSIS — D0462 Carcinoma in situ of skin of left upper limb, including shoulder: Secondary | ICD-10-CM | POA: Diagnosis not present

## 2016-11-27 ENCOUNTER — Non-Acute Institutional Stay (SKILLED_NURSING_FACILITY): Payer: Medicare Other | Admitting: Family

## 2016-11-27 ENCOUNTER — Encounter: Payer: Self-pay | Admitting: Family

## 2016-11-27 DIAGNOSIS — H6121 Impacted cerumen, right ear: Secondary | ICD-10-CM

## 2016-11-27 DIAGNOSIS — R238 Other skin changes: Secondary | ICD-10-CM | POA: Diagnosis not present

## 2016-11-27 NOTE — Progress Notes (Signed)
Location:  [place] Room Number: 16 Place of Service:  SNF ([phone]) Provider: Dinah Ngetich FNP-C  Blanchie Serve, MD  Patient Care Team: Blanchie Serve, MD as PCP - General (Internal Medicine) Clent Jacks, MD (Ophthalmology) Adrian Prows, MD as Attending Physician (Cardiology) [place], [facility] [name], MD as Consulting Physician (Orthopedic Surgery) Clance, Armando Reichert, MD as Consulting Physician (Pulmonary Disease) Ngetich, Nelda Bucks, NP as Nurse Practitioner [facility] Medicine)  Extended Emergency Contact Information Primary Emergency Contact: Jaco,[name] Address: [address]          [place], [place] [address] [name] of [place] Phone: [phone] Mobile Phone: [phone] Relation: Son Secondary Emergency Contact: [name] [place] of [place] Mobile Phone: [phone] Relation: Daughter  Code Status:  DNR Goals of care: Advanced Directive information Advanced Directives 11/27/2016  Does Patient Have a Medical Advance Directive? Yes  Type of Advance Directive Out of facility DNR (pink MOST or yellow form);Living will;Healthcare Power of Attorney  Does patient want to make changes to medical advance directive? -  Copy of [place] in Chart? Yes  Pre-existing out of facility DNR order (yellow form or pink MOST form) Yellow form placed in chart (order not valid for inpatient use)     Chief Complaint  Patient presents with  . Acute Visit    check biopsy site    HPI:  Pt is a 83 y.o. female seen today at [facility] for an acute visit for evaluation of left lateral arm biopsy site. She is seen in her room today per facility Nurse request. Nurse reports patient's recent biopsy site redness seemed to have increased over the last four days.No drainage or odor noted. Patient states site painful to touch otherwise does not bother her. She continues with efudex application twice daily.she also complains of  left ear decreased hearing. She wears left ear hearing aid.        Past Medical History:  Diagnosis Date  . Abnormality of gait   . Anemia, unspecified   . Arthritis   . Bronchiectasis without acute exacerbation ([place]) 04/10/2011   CT chest 2011   . Cataract 1990   Dr. Katy Fitch  . Cholelithiases   . Closed fracture of unspecified part of ramus of mandible   . COPD (chronic obstructive pulmonary disease) ([place])   . Coronary atherosclerosis of unspecified type of vessel, native or graft   . Disturbance of skin sensation   . DOE (dyspnea on exertion) 04/10/2011   PFTs 2013:  FEV1 1.04 (78%), ratio 64, couldn't do maneuver for lung volumes or dlco   . Edema   . Esophageal reflux   . Fall from other slipping, tripping, or stumbling   . Gastric ulcer with hemorrhage 2008  . Hypertension   . Hypoxemia   . Insomnia, unspecified   . Mucopurulent chronic bronchitis ([place])   . Nodule of neck 11/07/2013   Left neck posteriorly   . Nontoxic uninodular goiter   . Osteoarthrosis, unspecified whether generalized or localized, unspecified site   . Other and unspecified hyperlipidemia   . Other malaise and fatigue   . Pain in joint, lower leg 2010   right knee  . Pain in joint, shoulder region 2013   left  . Palpitations   . Pneumonia, organism unspecified(486)   . Regional enteritis of unspecified site   . Rosacea   . Sciatica   . Squamous cell carcinoma of skin of lower limb, including hip  07/27/2012   Nonhealing lesion of the left mid calf medially   . Tachycardia 12/06/2014  . Tension headache   . Tension headache   . Type II or unspecified type diabetes mellitus without mention of complication, not stated as uncontrolled   . Unspecified venous (peripheral) insufficiency   . Urine, incontinence, stress female 07/19/2013   Past Surgical History:  Procedure Laterality Date  . EYE SURGERY Bilateral 1990   Dr. Katy Fitch  . GASTROJEJUNOSTOMY  05/2004   secondary to ulcer  . JOINT REPLACEMENT  Bilateral 1993-1998   total knee replacement  . MOLE REMOVAL  05/11/14   left hand and left side of face [facility]  . OTHER SURGICAL HISTORY  2008   Ulcer surgery   . SMALL INTESTINE SURGERY    . TOTAL KNEE ARTHROPLASTY Bilateral 1993, 1998   knees    Allergies  Allergen Reactions  . Adhesive [Tape]     unknown  . Aspirin     unknown  . Augmentin [Amoxicillin-Pot Clavulanate]   . Codeine     unknown  . Diclofenac   . Enalapril Cough  . Hydrocodone     unknown  . Pantoprazole Sodium     unknown  . Voltaren [Diclofenac Sodium]     Outpatient Encounter Prescriptions as of 11/27/2016  Medication Sig  . acetaminophen (TYLENOL) 325 MG tablet Take 650 mg by mouth every 6 (six) hours as needed for moderate pain or headache.  Marland Kitchen acetaminophen (TYLENOL) 500 MG tablet Take 500 mg by mouth. Take one once daily in morning, take 2 at bedtime  . atorvastatin (LIPITOR) 10 MG tablet Take 5 mg by mouth daily.  . carbamide peroxide (DEBROX) 6.5 % otic solution Place 5 drops into the left ear at bedtime. On the 12th, 13th and 14th of the month  . Fluticasone-Salmeterol (ADVAIR) 100-50 MCG/DOSE AEPB Inhale 1 puff into the lungs 2 (two) times daily.  . furosemide (LASIX) 40 MG tablet Take 40 mg by mouth 2 (two) times daily. Hold for SBP <110  . guaiFENesin (MUCINEX) 600 MG 12 hr tablet Take 600 mg by mouth 2 (two) times daily.  . insulin glargine (LANTUS) 100 UNIT/ML injection Inject 18 Units into the skin at bedtime.   . insulin lispro (HUMALOG) 100 UNIT/ML cartridge Inject 12 Units into the skin. At 1230 PM and 530 PM. (If blood sugar is 150-250 = 8 units, if blood sugar is greater than 250 = 12 units daily)  . ipratropium-albuterol (DUONEB) 0.5-2.5 (3) MG/3ML SOLN Take 3 mLs by nebulization every 8 (eight) hours as needed. In addition to the scheduled dose  . loperamide (IMODIUM A-D) 2 MG tablet Take 4 mg by mouth as needed for diarrhea or loose stools. Give 4 mg as the initial dose.  Then give 2 mg after each loose stool  . loratadine (CLARITIN) 10 MG tablet Take 10 mg by mouth daily as needed for allergies.  Marland Kitchen losartan (COZAAR) 50 MG tablet Take 75 mg by mouth daily. Hold if SBP< 110  . metoprolol tartrate (LOPRESSOR) 25 MG tablet Take 25 mg by mouth 2 (two) times daily.  . Multiple Vitamins-Minerals (MULTIVITAMIN WITH MINERALS) tablet Take 1 tablet by mouth daily.  Marland Kitchen omeprazole (PRILOSEC) 20 MG capsule Take 20 mg by mouth daily.   . OXYGEN Inhale into the lungs. 3 liter via nasal canula  . predniSONE (DELTASONE) 5 MG tablet Take 5 mg by mouth daily with breakfast.  . spironolactone (ALDACTONE) 25 MG tablet Take 12.5  mg by mouth daily.   . traZODone (DESYREL) 50 MG tablet Take 0.5 tablets (25 mg total) by mouth at bedtime.  . [DISCONTINUED] guaiFENesin-dextromethorphan (ROBITUSSIN DM) 100-10 MG/5ML syrup Take 10 mLs by mouth every 6 (six) hours as needed for cough. For 48 hours as needed  . [DISCONTINUED] rOPINIRole (REQUIP) 0.25 MG tablet Take 1 tablet (0.25 mg total) by mouth at bedtime. As needed   No facility-administered encounter medications on file as of 11/27/2016.     Review of Systems  Constitutional: Negative for activity change, appetite change, chills and fever.  HENT: Positive for hearing loss. Negative for congestion, postnasal drip, rhinorrhea, sinus pain, sinus pressure, sneezing and sore throat.   Eyes: Negative for discharge.       Wears eye glasses  Respiratory: Negative for cough, chest tightness, shortness of breath and wheezing.   Cardiovascular: Negative for chest pain, palpitations and leg swelling.  Musculoskeletal: Positive for arthralgias and gait problem.  Skin: Negative for pallor and rash.       Left lateral arm biopsy site skin redness   Hematological: Does not bruise/bleed easily.  Psychiatric/Behavioral: Negative for agitation. The patient is not nervous/anxious.     Immunization History  Administered Date(s) Administered  .  Influenza Split 11/06/2011, 11/04/2012  . Influenza,inj,Quad PF,6+ Mos 11/17/2013  . Influenza-Unspecified 11/09/2014, 11/16/2015, 11/25/2015, 11/13/2016  . PPD Test 06/19/2010, 11/29/2014  . Pneumococcal Conjugate-13 08/11/2014  . Pneumococcal Polysaccharide-23 02/04/2003  . Pneumococcal-Unspecified 06/07/2014  . Tdap 02/27/2013  . Zoster 02/03/2006   Pertinent  Health Maintenance Due  Topic Date Due  . DEXA SCAN  07/18/2026 (Originally 10/31/1984)  . HEMOGLOBIN A1C  01/09/2017  . FOOT EXAM  02/07/2017  . OPHTHALMOLOGY EXAM  07/22/2017  . INFLUENZA VACCINE  Completed  . PNA vac Low Risk Adult  Completed   Fall Risk  12/06/2014 07/11/2014 05/22/2014 11/07/2013 08/30/2013  Falls in the past year? Yes No Yes No No  Number falls in past yr: 1 - 1 - -  Injury with Fall? No - No - -  Risk for fall due to : History of fall(s);Impaired mobility - History of fall(s);Impaired balance/gait - Impaired balance/gait;Impaired mobility  Follow up - - Falls evaluation completed - -    Vitals:   11/27/16 1001  BP: (!) 145/68  Pulse: 74  Resp: 18  Temp: 98.7 F (37.1 C)  SpO2: 95%  Weight: 208 lb (94.3 kg)  Height: 5\' 4"  (1.626 m)   Body mass index is 35.7 kg/m. Physical Exam  Constitutional: She is oriented to person, place, and time. She appears well-developed and well-nourished. No distress.  HENT:  Head: Normocephalic.  Right Ear: External ear normal.  Mouth/Throat: Oropharynx is clear and moist.  left ear hearing aid. right ear dry cerumen impaction noted   Eyes: Pupils are equal, round, and reactive to light. Conjunctivae and EOM are normal. Right eye exhibits no discharge. Left eye exhibits no discharge. No scleral icterus.  Neck: Normal range of motion. No JVD present. No thyromegaly present.  Cardiovascular: Normal rate, regular rhythm and intact distal pulses.  Exam reveals no gallop and no friction rub.   No murmur heard. Pulmonary/Chest: Effort normal and breath sounds normal.  No respiratory distress. She has no wheezes. She has no rales.  continuous oxygen 2 liters via [place] in place   Abdominal: Soft. Bowel sounds are normal. She exhibits no distension. There is no tenderness. There is no rebound and no guarding.  LBM 11/27/2016  Neurological: She is  oriented to person, place, and time. Coordination normal.  Skin: Skin is warm and dry. No rash noted. No pallor.  Left lateral arm recent biopsy site skin redness noted.No drainage surrounding skin tissue without any signs of infection. Skin cool to touch.    Psychiatric: She has a normal mood and affect.   Labs reviewed:  Recent Labs  07/10/16 07/14/16 08/18/16  NA 141 139 139  K 3.9 4.1 3.9  BUN 43* 30* 35*  CREATININE 0.9 0.9 1.1    Recent Labs  03/13/16 07/10/16 08/18/16  AST 16 14 20   ALT 11 12 17   ALKPHOS 80 86 80    Recent Labs  03/13/16 07/10/16  WBC 7.0 8.1  HGB 10.3* 9.8*  HCT 32* 30*  PLT 184 168   Lab Results  Component Value Date   TSH 2.61 07/10/2016   Lab Results  Component Value Date   HGBA1C 7.6 07/10/2016   Lab Results  Component Value Date   CHOL 96 08/18/2016   HDL 58 08/18/2016   LDLCALC 22 08/18/2016   TRIG 82 08/18/2016    Significant Diagnostic Results in last 30 days:  No results found.  Assessment/Plan   Other skin changes Afebrile. Left lateral arm recent biopsy site skin redness noted.No drainage surrounding skin tissue without any signs of infection. Skin cool to touch. Suspect possible effects from efudex. Continue to monitor and follow up dermatology if worsening.  Cerumen impaction Apply 5 drops of debrox 6.5 % to right ear twice daily x 4 days then Nurse to lavage with warm water. Continue to monitor.   Family/ staff Communication: Reviewed plan of care with patient and facility Nurse  Labs/tests ordered: None   Nelda Bucks Ngetich, NP

## 2016-12-05 ENCOUNTER — Encounter: Payer: Self-pay | Admitting: Internal Medicine

## 2016-12-05 ENCOUNTER — Non-Acute Institutional Stay (SKILLED_NURSING_FACILITY): Payer: Medicare Other | Admitting: Internal Medicine

## 2016-12-05 DIAGNOSIS — M79602 Pain in left arm: Secondary | ICD-10-CM | POA: Diagnosis not present

## 2016-12-05 DIAGNOSIS — R739 Hyperglycemia, unspecified: Secondary | ICD-10-CM | POA: Diagnosis not present

## 2016-12-05 DIAGNOSIS — H6123 Impacted cerumen, bilateral: Secondary | ICD-10-CM | POA: Diagnosis not present

## 2016-12-05 DIAGNOSIS — I10 Essential (primary) hypertension: Secondary | ICD-10-CM | POA: Diagnosis not present

## 2016-12-05 DIAGNOSIS — L89312 Pressure ulcer of right buttock, stage 2: Secondary | ICD-10-CM

## 2016-12-05 NOTE — Progress Notes (Signed)
Location:  [facility] Room Number: 16 Place of Service:  SNF [phone]) Provider:  Blanchie Serve MD  Blanchie Serve, MD  Patient Care Team: Blanchie Serve, MD as PCP - General (Internal Medicine) Clent Jacks, MD (Ophthalmology) Adrian Prows, MD as Attending Physician (Cardiology) Butler, [facility] [name], MD as Consulting Physician (Orthopedic Surgery) Clance, Armando Reichert, MD as Consulting Physician (Pulmonary Disease) Ngetich, Nelda Bucks, NP as Nurse Practitioner [facility])  Extended Emergency Contact Information Primary Emergency Contact: Alas,[name] Address: [address]          [place], [place] [address] [name] of [place] Phone: [phone] Mobile Phone: [phone] Relation: Son Secondary Emergency Contact: [name] [place] of [place] Mobile Phone: [phone] Relation: Daughter  Code Status:  DNR  Goals of care: Advanced Directive information Advanced Directives 12/05/2016  Does Patient Have a Medical Advance Directive? Yes  Type of Paramedic of [place];Living will;Out of facility DNR (pink MOST or yellow form)  Does patient want to make changes to medical advance directive? No - Patient declined  Copy of [place] in Chart? Yes  Pre-existing out of facility DNR order (yellow form or pink MOST form) Yellow form placed in chart (order not valid for inpatient use)     Chief Complaint  Patient presents with  . Acute Visit    left arm pain, right buttock sore, ears stuffed up, elevated BP reading.     HPI:  Pt is a 83 y.o. female seen today for medical management of chronic diseases.    Stuffed ears- she mentions both of her ears feeling stuffed, right > left. Denies earache. Has hearing loss and wears hearing aid.   Right buttock pain- complaints of soreness to her right bottom for few days. It bothers her most when she lies down.  Left arm pain- s/p skin biopsy with  pain to the site. No fever reported.   Hypertension- few elevated BP readings. currently on losartan 75 mg daily, furosemide 40 mg bid, metoprolol tartrate 25 mg bid and spironolactone 12.5 mg daily. Off doxazosin.     Past Medical History:  Diagnosis Date  . Abnormality of gait   . Anemia, unspecified   . Arthritis   . Bronchiectasis without acute exacerbation ([place]) 04/10/2011   CT chest 2011   . Cataract 1990   Dr. Katy Fitch  . Cholelithiases   . Closed fracture of unspecified part of ramus of mandible   . COPD (chronic obstructive pulmonary disease) ([place])   . Coronary atherosclerosis of unspecified type of vessel, native or graft   . Disturbance of skin sensation   . DOE (dyspnea on exertion) 04/10/2011   PFTs 2013:  FEV1 1.04 (78%), ratio 64, couldn't do maneuver for lung volumes or dlco   . Edema   . Esophageal reflux   . Fall from other slipping, tripping, or stumbling   . Gastric ulcer with hemorrhage 2008  . Hypertension   . Hypoxemia   . Insomnia, unspecified   . Mucopurulent chronic bronchitis ([place])   . Nodule of neck 11/07/2013   Left neck posteriorly   . Nontoxic uninodular goiter   . Osteoarthrosis, unspecified whether generalized or localized, unspecified site   . Other and unspecified hyperlipidemia   . Other malaise and fatigue   . Pain in joint, lower leg 2010   right knee  . Pain in joint, shoulder region 2013   left  . Palpitations   .  Pneumonia, organism unspecified(486)   . Regional enteritis of unspecified site   . Rosacea   . Sciatica   . Squamous cell carcinoma of skin of lower limb, including hip 07/27/2012   Nonhealing lesion of the left mid calf medially   . Tachycardia 12/06/2014  . Tension headache   . Tension headache   . Type II or unspecified type diabetes mellitus without mention of complication, not stated as uncontrolled   . Unspecified venous (peripheral) insufficiency   . Urine, incontinence, stress female 07/19/2013   Past Surgical  History:  Procedure Laterality Date  . EYE SURGERY Bilateral 1990   Dr. Katy Fitch  . GASTROJEJUNOSTOMY  05/2004   secondary to ulcer  . JOINT REPLACEMENT Bilateral 1993-1998   total knee replacement  . MOLE REMOVAL  05/11/14   left hand and left side of face [facility]  . OTHER SURGICAL HISTORY  2008   Ulcer surgery   . SMALL INTESTINE SURGERY    . TOTAL KNEE ARTHROPLASTY Bilateral 1993, 1998   knees    Allergies  Allergen Reactions  . Adhesive [Tape]     unknown  . Aspirin     unknown  . Augmentin [Amoxicillin-Pot Clavulanate]   . Codeine     unknown  . Diclofenac   . Enalapril Cough  . Hydrocodone     unknown  . Pantoprazole Sodium     unknown  . Voltaren [Diclofenac Sodium]     Outpatient Encounter Prescriptions as of 12/05/2016  Medication Sig  . acetaminophen (TYLENOL) 325 MG tablet Take 650 mg by mouth every 6 (six) hours as needed for moderate pain or headache.  Marland Kitchen acetaminophen (TYLENOL) 500 MG tablet Take 1,000 mg by mouth at bedtime.   Marland Kitchen acetaminophen (TYLENOL) 500 MG tablet Take 500 mg by mouth daily.  . carbamide peroxide (DEBROX) 6.5 % OTIC solution Place 5 drops into both ears daily. Start on 12/05/16. Stop date 12/09/16  . Fluticasone-Salmeterol (ADVAIR) 100-50 MCG/DOSE AEPB Inhale 1 puff into the lungs 2 (two) times daily.  . furosemide (LASIX) 40 MG tablet Take 40 mg by mouth 2 (two) times daily. Hold for SBP <110  . guaiFENesin (MUCINEX) 600 MG 12 hr tablet Take 600 mg by mouth 2 (two) times daily.  Marland Kitchen guaifenesin (ROBITUSSIN) 100 MG/5ML syrup Take by mouth every 6 (six) hours as needed for cough (Give 10 mL).  . insulin glargine (LANTUS) 100 UNIT/ML injection Inject 18 Units into the skin at bedtime.   . insulin lispro (HUMALOG) 100 UNIT/ML cartridge Inject 12 Units into the skin. At 1230 PM and 530 PM. (If blood sugar is 150-250 = 8 units, if blood sugar is greater than 250 = 12 units daily)  . ipratropium-albuterol (DUONEB) 0.5-2.5 (3) MG/3ML SOLN  Take 3 mLs by nebulization every 8 (eight) hours as needed. In addition to the scheduled dose  . ipratropium-albuterol (DUONEB) 0.5-2.5 (3) MG/3ML SOLN Take 3 mLs by nebulization 2 (two) times daily.  Marland Kitchen loperamide (IMODIUM A-D) 2 MG tablet Take 4 mg by mouth as needed for diarrhea or loose stools. Give 4 mg as the initial dose. Then give 2 mg after each loose stool  . loratadine (CLARITIN) 10 MG tablet Take 10 mg by mouth daily as needed for allergies.  Marland Kitchen losartan (COZAAR) 50 MG tablet Take 75 mg by mouth daily. Hold if SBP< 110  . metoprolol tartrate (LOPRESSOR) 25 MG tablet Take 25 mg by mouth 2 (two) times daily.  . Multiple Vitamins-Minerals (MULTIVITAMIN WITH  MINERALS) tablet Take 1 tablet by mouth daily.  Marland Kitchen omeprazole (PRILOSEC) 20 MG capsule Take 20 mg by mouth daily.   . OXYGEN Inhale into the lungs. 3 liter via nasal canula  . predniSONE (DELTASONE) 5 MG tablet Take 5 mg by mouth daily with breakfast.  . spironolactone (ALDACTONE) 25 MG tablet Take 12.5 mg by mouth daily.   . traZODone (DESYREL) 50 MG tablet Take 0.5 tablets (25 mg total) by mouth at bedtime.  . [DISCONTINUED] atorvastatin (LIPITOR) 10 MG tablet Take 5 mg by mouth daily.  . [DISCONTINUED] carbamide peroxide (DEBROX) 6.5 % otic solution Place 5 drops into the left ear at bedtime. On the 12th, 13th and 14th of the month   No facility-administered encounter medications on file as of 12/05/2016.     Review of Systems  Constitutional: Negative for appetite change, diaphoresis and fever.  HENT: Positive for hearing loss. Negative for congestion, ear discharge, ear pain, mouth sores, rhinorrhea and trouble swallowing.   Eyes: Positive for visual disturbance.  Respiratory: Positive for cough and shortness of breath.        On chronic o2 by nasal canula  Cardiovascular: Positive for leg swelling. Negative for chest pain and palpitations.  Gastrointestinal: Negative for abdominal pain.  Genitourinary: Positive for frequency.  Negative for dysuria.  Musculoskeletal: Positive for gait problem.  Neurological: Negative for dizziness and headaches.  Psychiatric/Behavioral: Negative for behavioral problems.    Immunization History  Administered Date(s) Administered  . Influenza Split 11/06/2011, 11/04/2012  . Influenza,inj,Quad PF,6+ Mos 11/17/2013  . Influenza-Unspecified 11/09/2014, 11/16/2015, 11/25/2015, 11/13/2016  . PPD Test 06/19/2010, 11/29/2014  . Pneumococcal Conjugate-13 08/11/2014  . Pneumococcal Polysaccharide-23 02/04/2003  . Pneumococcal-Unspecified 06/07/2014  . Tdap 02/27/2013  . Zoster 02/03/2006   Pertinent  Health Maintenance Due  Topic Date Due  . DEXA SCAN  07/18/2026 (Originally 10/31/1984)  . HEMOGLOBIN A1C  01/09/2017  . FOOT EXAM  02/07/2017  . OPHTHALMOLOGY EXAM  07/22/2017  . INFLUENZA VACCINE  Completed  . PNA vac Low Risk Adult  Completed   Fall Risk  12/06/2014 07/11/2014 05/22/2014 11/07/2013 08/30/2013  Falls in the past year? Yes No Yes No No  Number falls in past yr: 1 - 1 - -  Injury with Fall? No - No - -  Risk for fall due to : History of fall(s);Impaired mobility - History of fall(s);Impaired balance/gait - Impaired balance/gait;Impaired mobility  Follow up - - Falls evaluation completed - -   Functional Status Survey:    Vitals:   12/05/16 0941  BP: (!) 162/74  Pulse: 76  Resp: 18  Temp: 97.9 F (36.6 C)  TempSrc: Oral  SpO2: 97%  Weight: 208 lb (94.3 kg)  Height: 5\' 7"  (1.702 m)   Body mass index is 32.58 kg/m.   Wt Readings from Last 3 Encounters:  12/05/16 208 lb (94.3 kg)  11/27/16 208 lb (94.3 kg)  11/10/16 208 lb (94.3 kg)   Physical Exam  Constitutional: She is oriented to person, place, and time. No distress.  Obese elderly female patient in no distress.  HENT:  Head: Normocephalic and atraumatic.  Mouth/Throat: Oropharynx is clear and moist. No oropharyngeal exudate.  Eyes: Pupils are equal, round, and reactive to light. EOM are normal.  Right eye exhibits no discharge. Left eye exhibits no discharge.  Has corrective lenses  Neck: Normal range of motion. Neck supple.  Cardiovascular: Normal rate and regular rhythm.   Pulmonary/Chest: Effort normal.  On oxygen by nasal canula. Decreased air  entry to lung bases, expiratory wheezing present.   Abdominal: Soft. Bowel sounds are normal. She exhibits no distension. There is no tenderness.  Musculoskeletal: She exhibits edema.  Able to move all 4 extremities, small transfers with walker, uses motorized scooter. Arthritis changes oresent  Lymphadenopathy:    She has no cervical adenopathy.  Neurological: She is alert and oriented to person, place, and time.  Skin: Skin is warm and dry. She is not diaphoretic.  Stage 2 pressure ulcer 0.5 x 0.5 cm to right buttock, no signs of infection, no drainage Left arm skin biopsy site with erythema to biopsy site and periwound erythema present, no active drainage, slight bleeding noted, tender to touch.    Psychiatric: She has a normal mood and affect.    Labs reviewed:  Recent Labs  07/10/16 07/14/16 08/18/16  NA 141 139 139  K 3.9 4.1 3.9  BUN 43* 30* 35*  CREATININE 0.9 0.9 1.1    Recent Labs  03/13/16 07/10/16 08/18/16  AST 16 14 20   ALT 11 12 17   ALKPHOS 80 86 80    Recent Labs  03/13/16 07/10/16  WBC 7.0 8.1  HGB 10.3* 9.8*  HCT 32* 30*  PLT 184 168   Lab Results  Component Value Date   TSH 2.61 07/10/2016   Lab Results  Component Value Date   HGBA1C 7.6 07/10/2016   Lab Results  Component Value Date   CHOL 96 08/18/2016   HDL 58 08/18/2016   LDLCALC 22 08/18/2016   TRIG 82 08/18/2016    Significant Diagnostic Results in last 30 days:  No results found.  Assessment/Plan  Left arm pain Left arm biopsy site appears inflamed and is tender to touch. No drainage noted. Patient is afebrile. currently on 5-Fluorouracil for squamous cell carcinoma. Appears to have developed reaction to it. No signs of  infection. D/c 5-fluorouracil for now and to contact dermatology office for further recommendation. REVIEWED DERMATOLOGY VISIT NOTE.  Right buttock stage 2 pressure ulcer Gel cushion for her chair, frequent repositioning to be encouraged. decubivite to promote wound healing. Cleanse area with NS, let dry, apply hydrogel and foam dressing and change every other day and as needed.   Hypertension Elevated BP reading. Increase losartan to 100 mg daily. Continue metoprolol tartrate, lasix and spironolactone current regimen. Monitor BP bid for now x 1 week, then daily x 2 weeks. Check BMP.   Impacted cerumen both ears Right > left, debrox ear drops bid x 5 days, followed by ear lavage  Elevated blood sugar With her diabetes, check a1c. To help promote wound healing, will need tighter control on blood sugar. Increase lantus to 23 u daily for now. Continue humalog 12 u bid and SSI humalog as well.    Family/ staff Communication: reviewed care plan with patient and charge nurse.    Labs/tests ordered:  A1c, bmp, cbc with diff next lab   I spent 45 minutes in total face-to-face time with the patient, more than 50% of which was spent in counseling and coordination of care, reviewing test results, reviewing medication and discussing or reviewing the diagnosis with patient.     Blanchie Serve, MD Internal Medicine [facility] [address] [place], [place] [address] Cell Phone (Monday-Friday 8 am - 5 pm): [phone] On Call: [phone] and follow prompts after 5 pm and on weekends Office Phone: [phone] Office Fax: [phone]

## 2016-12-08 DIAGNOSIS — R05 Cough: Secondary | ICD-10-CM | POA: Diagnosis not present

## 2016-12-08 DIAGNOSIS — E871 Hypo-osmolality and hyponatremia: Secondary | ICD-10-CM | POA: Diagnosis not present

## 2016-12-08 DIAGNOSIS — E1151 Type 2 diabetes mellitus with diabetic peripheral angiopathy without gangrene: Secondary | ICD-10-CM | POA: Diagnosis not present

## 2016-12-08 LAB — CBC
HCT: 33.8
HEMOGLOBIN: 11.4
PLATELET COUNT: 235
WBC: 9.1

## 2016-12-08 LAB — BASIC METABOLIC PANEL
BUN: 40 — AB (ref 4–21)
BUN: 40 — AB (ref 4–21)
Calcium: 9
Creat: 1.06
Creatinine: 1.1 (ref 0.5–1.1)
GLUCOSE: 154
GLUCOSE: 154
POTASSIUM: 4.3 (ref 3.4–5.3)
POTASSIUM: 4.3
Sodium: 136
Sodium: 136 — AB (ref 137–147)

## 2016-12-08 LAB — CBC AND DIFFERENTIAL
HEMATOCRIT: 34 — AB (ref 36–46)
HEMOGLOBIN: 11.4 — AB (ref 12.0–16.0)
PLATELETS: 235 (ref 150–399)
WBC: 9.1

## 2016-12-08 LAB — HEMOGLOBIN A1C
A1c: 7.2
Hemoglobin A1C: 7.2

## 2016-12-09 ENCOUNTER — Encounter: Payer: Self-pay | Admitting: *Deleted

## 2016-12-15 DIAGNOSIS — E785 Hyperlipidemia, unspecified: Secondary | ICD-10-CM | POA: Diagnosis not present

## 2016-12-15 LAB — LIPID PANEL
Cholesterol: 117 (ref 0–200)
HDL: 52 (ref 35–70)
LDL CALC: 40
Triglycerides: 186 — AB (ref 40–160)

## 2016-12-17 DIAGNOSIS — L570 Actinic keratosis: Secondary | ICD-10-CM | POA: Diagnosis not present

## 2016-12-17 DIAGNOSIS — D0462 Carcinoma in situ of skin of left upper limb, including shoulder: Secondary | ICD-10-CM | POA: Diagnosis not present

## 2016-12-17 DIAGNOSIS — L821 Other seborrheic keratosis: Secondary | ICD-10-CM | POA: Diagnosis not present

## 2016-12-22 ENCOUNTER — Non-Acute Institutional Stay (SKILLED_NURSING_FACILITY): Payer: Medicare Other | Admitting: Internal Medicine

## 2016-12-22 ENCOUNTER — Encounter: Payer: Self-pay | Admitting: Internal Medicine

## 2016-12-22 DIAGNOSIS — N183 Chronic kidney disease, stage 3 unspecified: Secondary | ICD-10-CM

## 2016-12-22 DIAGNOSIS — I5042 Chronic combined systolic (congestive) and diastolic (congestive) heart failure: Secondary | ICD-10-CM | POA: Diagnosis not present

## 2016-12-22 DIAGNOSIS — I130 Hypertensive heart and chronic kidney disease with heart failure and stage 1 through stage 4 chronic kidney disease, or unspecified chronic kidney disease: Secondary | ICD-10-CM | POA: Diagnosis not present

## 2016-12-22 DIAGNOSIS — J9611 Chronic respiratory failure with hypoxia: Secondary | ICD-10-CM | POA: Diagnosis not present

## 2016-12-22 DIAGNOSIS — E1122 Type 2 diabetes mellitus with diabetic chronic kidney disease: Secondary | ICD-10-CM

## 2016-12-22 DIAGNOSIS — H6121 Impacted cerumen, right ear: Secondary | ICD-10-CM

## 2016-12-22 DIAGNOSIS — Z794 Long term (current) use of insulin: Secondary | ICD-10-CM

## 2016-12-22 NOTE — Progress Notes (Signed)
Location:  [place] Room Number: 16 Place of Service:  SNF [phone]) Provider:  Blanchie Serve MD  Blanchie Serve, MD  Patient Care Team: Blanchie Serve, MD as PCP - General (Internal Medicine) Clent Jacks, MD (Ophthalmology) Adrian Prows, MD as Attending Physician (Cardiology) [place], [facility] [name], MD as Consulting Physician (Orthopedic Surgery) Clance, Armando Reichert, MD as Consulting Physician (Pulmonary Disease) Ngetich, Nelda Bucks, NP as Nurse Practitioner [facility] Medicine)  Extended Emergency Contact Information Primary Emergency Contact: [name],Carla Address: [address]          [place], [place] [address] [name] of [place] Phone: [phone] Mobile Phone: [phone] Relation: Son Secondary Emergency Contact: [name] [place] of [place] Mobile Phone: [phone] Relation: Daughter  Code Status:  DNR Goals of care: Advanced Directive information Advanced Directives 12/22/2016  Does Patient Have a Medical Advance Directive? Yes  Type of Paramedic of [place];Out of facility DNR (pink MOST or yellow form);Living will  Does patient want to make changes to medical advance directive? No - Patient declined  Copy of [place] in Chart? Yes  Pre-existing out of facility DNR order (yellow form or pink MOST form) Yellow form placed in chart (order not valid for inpatient use)     Chief Complaint  Patient presents with  . Medical Management of Chronic Issues    Routine Visit     HPI:  Pt is a 83 y.o. female seen today for medical management of chronic diseases. She complaints fo her ears being stuffed, mainly right ear. Has hearing aid to left ear. Denies ear ache or discharge. Breathing has been stable, currently on o2. Has chronic cough. No new urinary or bowel complaint. No fall reported. Complaint with her medications. Sleeping good at night, need to urinate interrupts her  sleep at times. Mood stable. No new concern from nursing. Reviewed BP readings with some SBP readings of 160-186 with DBP mostly normal. Blood sugar readings mostly in 100s, few readings in 230-280 range with some in 300s as well. Lowest cbg 70. Denies hypoglycemic symptoms.   Past Medical History:  Diagnosis Date  . Abnormality of gait   . Anemia, unspecified   . Arthritis   . Bronchiectasis without acute exacerbation ([place]) 04/10/2011   CT chest 2011   . Cataract 1990   Dr. Katy Fitch  . Cholelithiases   . Closed fracture of unspecified part of ramus of mandible   . COPD (chronic obstructive pulmonary disease) ([place])   . Coronary atherosclerosis of unspecified type of vessel, native or graft   . Disturbance of skin sensation   . DOE (dyspnea on exertion) 04/10/2011   PFTs 2013:  FEV1 1.04 (78%), ratio 64, couldn't do maneuver for lung volumes or dlco   . Edema   . Esophageal reflux   . Fall from other slipping, tripping, or stumbling   . Gastric ulcer with hemorrhage 2008  . Hypertension   . Hypoxemia   . Insomnia, unspecified   . Mucopurulent chronic bronchitis ([place])   . Nodule of neck 11/07/2013   Left neck posteriorly   . Nontoxic uninodular goiter   . Osteoarthrosis, unspecified whether generalized or localized, unspecified site   . Other and unspecified hyperlipidemia   . Other malaise and fatigue   . Pain in joint, lower leg 2010   right knee  . Pain in joint, shoulder region 2013   left  . Palpitations   . Pneumonia,  organism unspecified(486)   . Regional enteritis of unspecified site   . Rosacea   . Sciatica   . Squamous cell carcinoma of skin of lower limb, including hip 07/27/2012   Nonhealing lesion of the left mid calf medially   . Tachycardia 12/06/2014  . Tension headache   . Tension headache   . Type II or unspecified type diabetes mellitus without mention of complication, not stated as uncontrolled   . Unspecified venous (peripheral) insufficiency   . Urine,  incontinence, stress female 07/19/2013   Past Surgical History:  Procedure Laterality Date  . EYE SURGERY Bilateral 1990   Dr. Katy Fitch  . GASTROJEJUNOSTOMY  05/2004   secondary to ulcer  . JOINT REPLACEMENT Bilateral 1993-1998   total knee replacement  . MOLE REMOVAL  05/11/14   left hand and left side of [facility]  . OTHER SURGICAL HISTORY  2008   Ulcer surgery   . SMALL INTESTINE SURGERY    . TOTAL KNEE ARTHROPLASTY Bilateral 1993, 1998   knees    Allergies  Allergen Reactions  . Adhesive [Tape]     unknown  . Aspirin     unknown  . Augmentin [Amoxicillin-Pot Clavulanate]   . Codeine     unknown  . Diclofenac   . Enalapril Cough  . Hydrocodone     unknown  . Pantoprazole Sodium     unknown  . Voltaren [Diclofenac Sodium]     Outpatient Encounter Medications as of 12/22/2016  Medication Sig  . acetaminophen (TYLENOL) 325 MG tablet Take 650 mg by mouth every 6 (six) hours as needed for moderate pain.   Marland Kitchen acetaminophen (TYLENOL) 500 MG tablet Take 1,000 mg by mouth at bedtime.   Marland Kitchen acetaminophen (TYLENOL) 500 MG tablet Take 500 mg by mouth daily.  . Fluticasone-Salmeterol (ADVAIR) 100-50 MCG/DOSE AEPB Inhale 1 puff into the lungs 2 (two) times daily.  . furosemide (LASIX) 40 MG tablet Take 40 mg by mouth 2 (two) times daily. Hold for SBP <110  . guaiFENesin (MUCINEX) 600 MG 12 hr tablet Take 600 mg by mouth 2 (two) times daily.  Marland Kitchen guaifenesin (ROBITUSSIN) 100 MG/5ML syrup Take by mouth every 6 (six) hours as needed for cough (Give 10 mL).  . insulin glargine (LANTUS) 100 UNIT/ML injection Inject 23 Units at bedtime into the skin.   Marland Kitchen insulin lispro (HUMALOG) 100 UNIT/ML cartridge Inject 12 Units into the skin. At 1230 PM and 530 PM. (If blood sugar is 150-250 = 8 units, if blood sugar is greater than 250 = 12 units daily)  . ipratropium-albuterol (DUONEB) 0.5-2.5 (3) MG/3ML SOLN Take 3 mLs by nebulization every 8 (eight) hours as needed. In addition to the  scheduled dose  . ipratropium-albuterol (DUONEB) 0.5-2.5 (3) MG/3ML SOLN Take 3 mLs by nebulization 2 (two) times daily.  Marland Kitchen loperamide (IMODIUM A-D) 2 MG tablet Take 2 mg by mouth as needed for diarrhea or loose stools. Give 4 mg as the initial dose. Then give 2 mg after each loose stool  . loratadine (CLARITIN) 10 MG tablet Take 10 mg by mouth daily as needed for allergies.  Marland Kitchen losartan (COZAAR) 100 MG tablet Take 100 mg daily by mouth.  . metoprolol tartrate (LOPRESSOR) 25 MG tablet Take 25 mg by mouth 2 (two) times daily.  . mineral oil-hydrophilic petrolatum (AQUAPHOR) ointment Apply 1 application daily as needed topically for dry skin (to the left upper arm).  . Multiple Vitamins-Minerals (DECUBI-VITE PO) Take 1 tablet daily by mouth.  Marland Kitchen  Multiple Vitamins-Minerals (MULTIVITAMIN WITH MINERALS) tablet Take 1 tablet daily by mouth.  Marland Kitchen omeprazole (PRILOSEC) 20 MG capsule Take 20 mg by mouth daily.   . OXYGEN Inhale into the lungs. 3 liter via nasal canula  . predniSONE (DELTASONE) 5 MG tablet Take 5 mg by mouth daily with breakfast.  . spironolactone (ALDACTONE) 25 MG tablet Take 12.5 mg by mouth daily.   . traZODone (DESYREL) 50 MG tablet Take 0.5 tablets (25 mg total) by mouth at bedtime.  . [DISCONTINUED] losartan (COZAAR) 25 MG tablet Take 75 mg by mouth daily.  . [DISCONTINUED] carbamide peroxide (DEBROX) 6.5 % OTIC solution Place 5 drops into both ears daily. Start on 12/05/16. Stop date 12/09/16  . [DISCONTINUED] Multiple Vitamins-Minerals (MULTIVITAMIN WITH MINERALS) tablet Take 1 tablet by mouth daily.   No facility-administered encounter medications on file as of 12/22/2016.     Review of Systems  Constitutional: Negative for appetite change, chills, diaphoresis, fatigue and fever.  HENT: Positive for hearing loss. Negative for congestion, dental problem, ear discharge, ear pain, mouth sores, sinus pressure, sore throat and trouble swallowing.   Eyes: Positive for visual disturbance.    Respiratory: Positive for cough and shortness of breath.   Cardiovascular: Negative for chest pain and palpitations.  Gastrointestinal: Negative for abdominal pain, diarrhea, nausea and vomiting.  Genitourinary: Positive for frequency. Negative for dysuria.  Musculoskeletal: Positive for arthralgias and back pain.  Skin: Negative for rash.  Neurological: Negative for dizziness and headaches.  Psychiatric/Behavioral: Negative for behavioral problems.    Immunization History  Administered Date(s) Administered  . Influenza Split 11/06/2011, 11/04/2012  . Influenza,inj,Quad PF,6+ Mos 11/17/2013  . Influenza-Unspecified 11/09/2014, 11/16/2015, 11/25/2015, 11/13/2016  . PPD Test 06/19/2010, 11/29/2014  . Pneumococcal Conjugate-13 08/11/2014  . Pneumococcal Polysaccharide-23 02/04/2003  . Pneumococcal-Unspecified 06/07/2014  . Tdap 02/27/2013  . Zoster 02/03/2006   Pertinent  Health Maintenance Due  Topic Date Due  . DEXA SCAN  07/18/2026 (Originally 10/31/1984)  . FOOT EXAM  02/07/2017  . HEMOGLOBIN A1C  06/07/2017  . OPHTHALMOLOGY EXAM  07/22/2017  . INFLUENZA VACCINE  Completed  . PNA vac Low Risk Adult  Completed   Fall Risk  12/06/2014 07/11/2014 05/22/2014 11/07/2013 08/30/2013  Falls in the past year? Yes No Yes No No  Number falls in past yr: 1 - 1 - -  Injury with Fall? No - No - -  Risk for fall due to : History of fall(s);Impaired mobility - History of fall(s);Impaired balance/gait - Impaired balance/gait;Impaired mobility  Follow up - - Falls evaluation completed - -   Functional Status Survey:    Vitals:   12/22/16 1051  BP: (!) 160/70  Pulse: 95  Resp: 20  Temp: 97.9 F (36.6 C)  TempSrc: Oral  SpO2: 92%  Weight: 211 lb 8 oz (95.9 kg)  Height: 5\' 7"  (1.702 m)   Body mass index is 33.13 kg/m.   Wt Readings from Last 3 Encounters:  12/22/16 211 lb 8 oz (95.9 kg)  12/05/16 208 lb (94.3 kg)  11/27/16 208 lb (94.3 kg)   Physical Exam  Constitutional: She is  oriented to person, place, and time. She appears well-developed.  Obese, in no acute distress  HENT:  Head: Normocephalic and atraumatic.  Mouth/Throat: Oropharynx is clear and moist. No oropharyngeal exudate.  Has hearing aid to left ear  Eyes: Conjunctivae are normal. Pupils are equal, round, and reactive to light. Right eye exhibits no discharge. Left eye exhibits no discharge.  Has corrective lenses  Neck: Normal range of motion. Neck supple.  Cardiovascular: Normal rate and regular rhythm.  Murmur heard. Pulmonary/Chest: Effort normal. No respiratory distress. She has no wheezes. She has no rales. She exhibits no tenderness.  Decreased air entry to lung bases  Abdominal: Soft. Bowel sounds are normal. There is no tenderness. There is no guarding.  Musculoskeletal:  Wheelchair bound, can use walker for short distance, trace leg edema present, unsteady gait  Lymphadenopathy:    She has no cervical adenopathy.  Neurological: She is alert and oriented to person, place, and time.  Skin: Skin is warm and dry. She is not diaphoretic.  Psychiatric: She has a normal mood and affect.    Labs reviewed: Recent Labs    07/14/16 08/18/16 12/08/16  NA 139 139 136*  136  K 4.1 3.9 4.3  4.3  BUN 30* 35* 40*  40*  CREATININE 0.9 1.1 1.1  1.06  CALCIUM  --   --  9.0   Recent Labs    03/13/16 07/10/16 08/18/16  AST 16 14 20   ALT 11 12 17   ALKPHOS 80 86 80   Recent Labs    03/13/16 07/10/16 12/08/16  WBC 7.0 8.1 9.1  9.1  HGB 10.3* 9.8* 11.4*  11.4  HCT 32* 30* 34*  33.8  PLT 184 168 235   Lab Results  Component Value Date   TSH 2.61 07/10/2016   Lab Results  Component Value Date   HGBA1C 7.2 12/08/2016   Lab Results  Component Value Date   CHOL 117 12/15/2016   HDL 52 12/15/2016   LDLCALC 40 12/15/2016   TRIG 186 (A) 12/15/2016    Significant Diagnostic Results in last 30 days:  No results found.  Assessment/Plan  Chronic respiratory failure Breathing has  been stable. Has CHF and COPD. Continue o2 by nasal canula, daily prednisone, mucinex, furosemide and her bronchodilators. Monitor for signs of exacerbation.   HTN Currently on metoprolol tartrate 25 mg bid, spironolactone 12.5 mg daily, losartan 100 mg daily and furosemide 40 mg bid. Reviewed renal function. Change metoprolol tartrate to 37.5 mg bid and monitor.   DM type 2 with ckd a1c 7.2, slightly improved from a1c in 6/7 of 7.6. With weight gain and elevated cbg readings, will increase lantus to 28 u daily. Continue premeal insulin. Continue ARB.  ckd stage 3 Impaired renal function. Currently on losartan. Monitor clinically. Will need tighter BP control. Changes as above.   impacted cerumen To right ear. 2 attempts of ear lavage after debrox has been unsuccessful at the facility. ENT referral recommended, pt wants this deferred. She will consult with her son and notify staff further.  Family/ staff Communication: reviewed care plan with patient and charge nurse.    Labs/tests ordered:  ENT referral.    Blanchie Serve, MD Internal Medicine [place], [place] [address] Cell Phone (Monday-Friday 8 am - 5 pm): [phone] On Call: [phone] and follow prompts after 5 pm and on weekends Office Phone: [phone] Office Fax: [phone]

## 2017-01-08 ENCOUNTER — Encounter: Payer: Self-pay | Admitting: *Deleted

## 2017-01-08 DIAGNOSIS — R531 Weakness: Secondary | ICD-10-CM | POA: Diagnosis not present

## 2017-01-08 DIAGNOSIS — J449 Chronic obstructive pulmonary disease, unspecified: Secondary | ICD-10-CM | POA: Diagnosis not present

## 2017-01-08 DIAGNOSIS — M6281 Muscle weakness (generalized): Secondary | ICD-10-CM | POA: Diagnosis not present

## 2017-01-08 DIAGNOSIS — J9620 Acute and chronic respiratory failure, unspecified whether with hypoxia or hypercapnia: Secondary | ICD-10-CM | POA: Diagnosis not present

## 2017-01-08 LAB — BASIC METABOLIC PANEL
BUN: 46 — AB (ref 4–21)
Calcium: 9
Creat: 1.03
Creatinine: 1 (ref 0.5–1.1)
GLUCOSE: 159
Glucose: 159
POTASSIUM: 3.8
Potassium: 3.8 (ref 3.4–5.3)
SODIUM: 137
Sodium: 137 (ref 137–147)

## 2017-01-09 ENCOUNTER — Encounter: Payer: Self-pay | Admitting: Family

## 2017-01-09 ENCOUNTER — Non-Acute Institutional Stay (SKILLED_NURSING_FACILITY): Payer: Medicare Other | Admitting: Family

## 2017-01-09 DIAGNOSIS — L89310 Pressure ulcer of right buttock, unstageable: Secondary | ICD-10-CM

## 2017-01-09 NOTE — Progress Notes (Signed)
Location:  [place] Room Number: 16 Place of Service:  SNF [phone]) Provider: Dinah Ngetich FNP-C  Blanchie Serve, MD  Patient Care Team: Blanchie Serve, MD as PCP - General (Internal Medicine) Clent Jacks, MD (Ophthalmology) Adrian Prows, MD as Attending Physician (Cardiology) [place], [facility] [name], MD as Consulting Physician (Orthopedic Surgery) Clance, Armando Reichert, MD as Consulting Physician (Pulmonary Disease) Ngetich, Nelda Bucks, NP as Nurse Practitioner [facility] Medicine)  Extended Emergency Contact Information Primary Emergency Contact: Mikel,[name] Address: [address]          [place], [place] [address] [name] of [place] Phone: [phone] Mobile Phone: [phone] Relation: Son Secondary Emergency Contact: [name] [place] of [place] Mobile Phone: [phone] Relation: Daughter  Code Status:  DNR Goals of care: Advanced Directive information Advanced Directives 01/09/2017  Does Patient Have a Medical Advance Directive? Yes  Type of Paramedic of [place];Out of facility DNR (pink MOST or yellow form);Living will  Does patient want to make changes to medical advance directive? -  Copy of [place] in Chart? Yes  Pre-existing out of facility DNR order (yellow form or pink MOST form) Yellow form placed in chart (order not valid for inpatient use)     Chief Complaint  Patient presents with  . Acute Visit    Check skin on buttocks    HPI:  Pt is a 83 y.o. female seen today at [facility] for an acute visit for evaluation of right buttock ulcer.She is seen in her room today with Facility Nurse present.Facility Nurse reports patient has been refusing to let staff assess and change dressing to pressure ulcer.Patient states pressure is resolved. She denies any fever, chills,pain or drainage.of note facility Nurse reports patient spends most time sitting on wheelchair during  the day and sleeps on her recliner at night.She also refuses to use previous ordered gel cushion on wheelchair and recliner.     Past Medical History:  Diagnosis Date  . Abnormality of gait   . Anemia, unspecified   . Arthritis   . Bronchiectasis without acute exacerbation ([place]) 04/10/2011   CT chest 2011   . Cataract 1990   Dr. Katy Fitch  . Cholelithiases   . Closed fracture of unspecified part of ramus of mandible   . COPD (chronic obstructive pulmonary disease) ([place])   . Coronary atherosclerosis of unspecified type of vessel, native or graft   . Disturbance of skin sensation   . DOE (dyspnea on exertion) 04/10/2011   PFTs 2013:  FEV1 1.04 (78%), ratio 64, couldn't do maneuver for lung volumes or dlco   . Edema   . Esophageal reflux   . Fall from other slipping, tripping, or stumbling   . Gastric ulcer with hemorrhage 2008  . Hypertension   . Hypoxemia   . Insomnia, unspecified   . Mucopurulent chronic bronchitis ([place])   . Nodule of neck 11/07/2013   Left neck posteriorly   . Nontoxic uninodular goiter   . Osteoarthrosis, unspecified whether generalized or localized, unspecified site   . Other and unspecified hyperlipidemia   . Other malaise and fatigue   . Pain in joint, lower leg 2010   right knee  . Pain in joint, shoulder region 2013   left  . Palpitations   . Pneumonia, organism unspecified(486)   . Regional enteritis of unspecified site   . Rosacea   . Sciatica   . Squamous cell carcinoma of skin of  lower limb, including hip 07/27/2012   Nonhealing lesion of the left mid calf medially   . Tachycardia 12/06/2014  . Tension headache   . Tension headache   . Type II or unspecified type diabetes mellitus without mention of complication, not stated as uncontrolled   . Unspecified venous (peripheral) insufficiency   . Urine, incontinence, stress female 07/19/2013   Past Surgical History:  Procedure Laterality Date  . EYE SURGERY Bilateral 1990   Dr. Katy Fitch  .  GASTROJEJUNOSTOMY  05/2004   secondary to ulcer  . JOINT REPLACEMENT Bilateral 1993-1998   total knee replacement  . MOLE REMOVAL  05/11/14   left hand and left side of face [facility]  . OTHER SURGICAL HISTORY  2008   Ulcer surgery   . SMALL INTESTINE SURGERY    . TOTAL KNEE ARTHROPLASTY Bilateral 1993, 1998   knees    Allergies  Allergen Reactions  . Adhesive [Tape]     unknown  . Aspirin     unknown  . Augmentin [Amoxicillin-Pot Clavulanate]   . Codeine     unknown  . Diclofenac   . Enalapril Cough  . Hydrocodone     unknown  . Pantoprazole Sodium     unknown  . Voltaren [Diclofenac Sodium]     Outpatient Encounter Medications as of 01/09/2017  Medication Sig  . acetaminophen (TYLENOL) 325 MG tablet Take 650 mg by mouth every 6 (six) hours as needed for moderate pain.   Marland Kitchen acetaminophen (TYLENOL) 500 MG tablet Take 1,000 mg by mouth at bedtime.   Marland Kitchen acetaminophen (TYLENOL) 500 MG tablet Take 500 mg by mouth daily.  . Amino Acids-Protein Hydrolys (FEEDING SUPPLEMENT, PRO-STAT SUGAR FREE 64,) LIQD Take 30 mLs by mouth daily.  . Fluticasone-Salmeterol (ADVAIR) 100-50 MCG/DOSE AEPB Inhale 1 puff into the lungs 2 (two) times daily.  . furosemide (LASIX) 40 MG tablet Take 40 mg by mouth 2 (two) times daily. Hold for SBP <110  . guaiFENesin (MUCINEX) 600 MG 12 hr tablet Take 600 mg by mouth 2 (two) times daily.  Marland Kitchen guaifenesin (ROBITUSSIN) 100 MG/5ML syrup Take by mouth every 6 (six) hours as needed for cough (Give 10 mL).  . insulin glargine (LANTUS) 100 UNIT/ML injection Inject 28 Units into the skin at bedtime.   . insulin lispro (HUMALOG) 100 UNIT/ML cartridge Inject 12 Units into the skin. At 1230 PM and 530 PM. (If blood sugar is 150-250 = 8 units, if blood sugar is greater than 250 = 12 units daily)  . ipratropium-albuterol (DUONEB) 0.5-2.5 (3) MG/3ML SOLN Take 3 mLs by nebulization every 8 (eight) hours as needed. In addition to the scheduled dose  .  ipratropium-albuterol (DUONEB) 0.5-2.5 (3) MG/3ML SOLN Take 3 mLs by nebulization 2 (two) times daily.  Marland Kitchen loperamide (IMODIUM A-D) 2 MG tablet Take 2 mg by mouth as needed for diarrhea or loose stools. Give 4 mg as the initial dose. Then give 2 mg after each loose stool  . loratadine (CLARITIN) 10 MG tablet Take 10 mg by mouth daily as needed for allergies.  Marland Kitchen losartan (COZAAR) 100 MG tablet Take 100 mg daily by mouth.  . metoprolol tartrate (LOPRESSOR) 25 MG tablet Take 37.5 mg by mouth 2 (two) times daily. Hold for SBP <110  . mineral oil-hydrophilic petrolatum (AQUAPHOR) ointment Apply 1 application daily as needed topically for dry skin (to the left upper arm).  . Multiple Vitamins-Minerals (MULTIVITAMIN WITH MINERALS) tablet Take 1 tablet daily by mouth.  Marland Kitchen omeprazole (  PRILOSEC) 20 MG capsule Take 20 mg by mouth daily.   . OXYGEN Inhale into the lungs. 3 liter via nasal canula  . predniSONE (DELTASONE) 5 MG tablet Take 5 mg by mouth daily with breakfast.  . spironolactone (ALDACTONE) 25 MG tablet Take 12.5 mg by mouth daily.   . traZODone (DESYREL) 50 MG tablet Take 0.5 tablets (25 mg total) by mouth at bedtime.  . [DISCONTINUED] metoprolol tartrate (LOPRESSOR) 25 MG tablet Take 25 mg by mouth 2 (two) times daily.  . [DISCONTINUED] Multiple Vitamins-Minerals (DECUBI-VITE PO) Take 1 tablet daily by mouth.   No facility-administered encounter medications on file as of 01/09/2017.     Review of Systems  Constitutional: Negative for activity change, chills, fatigue and fever.  HENT: Negative for congestion, rhinorrhea, sinus pressure, sinus pain, sneezing and sore throat.   Respiratory: Negative for chest tightness and shortness of breath.        Chronic cough   Cardiovascular: Negative for chest pain, palpitations and leg swelling.  Gastrointestinal: Negative for abdominal distention, abdominal pain, constipation, diarrhea, nausea and vomiting.  Genitourinary: Negative for dysuria, flank  pain and urgency.  Musculoskeletal: Positive for gait problem.  Skin: Negative for color change, pallor, rash and wound.  Hematological: Does not bruise/bleed easily.  Psychiatric/Behavioral: Negative for agitation. The patient is not nervous/anxious.     Immunization History  Administered Date(s) Administered  . Influenza Split 11/06/2011, 11/04/2012  . Influenza,inj,Quad PF,6+ Mos 11/17/2013  . Influenza-Unspecified 11/09/2014, 11/16/2015, 11/25/2015, 11/13/2016  . PPD Test 06/19/2010, 11/29/2014  . Pneumococcal Conjugate-13 08/11/2014  . Pneumococcal Polysaccharide-23 02/04/2003  . Pneumococcal-Unspecified 06/07/2014  . Tdap 02/27/2013  . Zoster 02/03/2006   Pertinent  Health Maintenance Due  Topic Date Due  . DEXA SCAN  07/18/2026 (Originally 10/31/1984)  . FOOT EXAM  02/07/2017  . HEMOGLOBIN A1C  06/07/2017  . OPHTHALMOLOGY EXAM  07/22/2017  . INFLUENZA VACCINE  Completed  . PNA vac Low Risk Adult  Completed   Fall Risk  12/06/2014 07/11/2014 05/22/2014 11/07/2013 08/30/2013  Falls in the past year? Yes No Yes No No  Number falls in past yr: 1 - 1 - -  Injury with Fall? No - No - -  Risk for fall due to : History of fall(s);Impaired mobility - History of fall(s);Impaired balance/gait - Impaired balance/gait;Impaired mobility  Follow up - - Falls evaluation completed - -    Vitals:   01/09/17 1048  BP: (!) 154/70  Pulse: 65  Resp: 18  Temp: (!) 97 F (36.1 C)  SpO2: 98%  Weight: 213 lb 9.6 oz (96.9 kg)  Height: 5\' 7"  (1.702 m)   Body mass index is 33.45 kg/m. Physical Exam  Constitutional: She is oriented to person, place, and time. She appears well-developed and well-nourished.  Elderly in no acute distress   HENT:  Head: Normocephalic.  Mouth/Throat: Oropharynx is clear and moist. No oropharyngeal exudate.  Hearing aids in place  Eyes: Conjunctivae and EOM are normal. Pupils are equal, round, and reactive to light. Right eye exhibits no discharge. Left eye  exhibits no discharge. No scleral icterus.  Neck: Normal range of motion. No JVD present. No thyromegaly present.  Cardiovascular: Normal rate, regular rhythm, normal heart sounds and intact distal pulses. Exam reveals no gallop and no friction rub.  No murmur heard. Pulmonary/Chest: Effort normal and breath sounds normal. No respiratory distress. She has no wheezes. She has no rales.  Oxygen 2 Liters via Nasal cannula   Abdominal: Soft. Bowel sounds are  normal. She exhibits no distension. There is no tenderness. There is no rebound and no guarding.  Musculoskeletal: She exhibits no edema or tenderness.  Unsteady gait spends most time on wheelchair and uses scooter for long distances.   Lymphadenopathy:    She has no cervical adenopathy.  Neurological: She is oriented to person, place, and time. Coordination normal.  Skin: Skin is warm and dry. No rash noted. No erythema.  Previous right gluteal pressure ulcer site intact small scab area noted. Surrounding skin tissue without any redness, tenderness or drainage  Psychiatric: She has a normal mood and affect.   Labs reviewed: Recent Labs    08/18/16 12/08/16 01/08/17  NA 139 136*  136 137  K 3.9 4.3  4.3 3.8  BUN 35* 40*  40* 46*  CREATININE 1.1 1.1  1.06 1.03  CALCIUM  --  9.0 9.0   Recent Labs    03/13/16 07/10/16 08/18/16  AST 16 14 20   ALT 11 12 17   ALKPHOS 80 86 80   Recent Labs    03/13/16 07/10/16 12/08/16  WBC 7.0 8.1 9.1  9.1  HGB 10.3* 9.8* 11.4*  11.4  HCT 32* 30* 34*  33.8  PLT 184 168 235   Lab Results  Component Value Date   TSH 2.61 07/10/2016   Lab Results  Component Value Date   HGBA1C 7.2 12/08/2016   Lab Results  Component Value Date   CHOL 117 12/15/2016   HDL 52 12/15/2016   LDLCALC 40 12/15/2016   TRIG 186 (A) 12/15/2016    Significant Diagnostic Results in last 30 days:  No results found.  Assessment/Plan  Pressure ulcer of right buttock, unstageable  Previous pressure ulcer site  healed.small scab area noted. Surrounding skin tissue without any redness, tenderness or drainage.apply barrier cream daily and as needed with peri-care.Continue with protein supplements.encouraged to use gel cushion or wheelchair and recliner.     Family/ staff Communication: Reviewed plan of care with patient and facility Nurse supervisor  Labs/tests ordered: None   Sandrea Hughs, NP

## 2017-01-21 ENCOUNTER — Non-Acute Institutional Stay (SKILLED_NURSING_FACILITY): Payer: Medicare Other | Admitting: Family

## 2017-01-21 ENCOUNTER — Encounter: Payer: Self-pay | Admitting: Family

## 2017-01-21 DIAGNOSIS — G2581 Restless legs syndrome: Secondary | ICD-10-CM

## 2017-01-21 DIAGNOSIS — G4700 Insomnia, unspecified: Secondary | ICD-10-CM

## 2017-01-21 MED ORDER — ROPINIROLE HCL 0.25 MG PO TABS: 0.2500 mg | ORAL_TABLET | Freq: Every day | ORAL | Status: DC

## 2017-01-21 MED ORDER — TRAZODONE HCL 50 MG PO TABS: 25.0000 mg | ORAL_TABLET | Freq: Every evening | ORAL | Status: DC | PRN

## 2017-01-21 NOTE — Progress Notes (Signed)
Location:  [place] Room Number: 16 Place of Service:  SNF ([phone]) Provider: Yoceline Bazar FNP-C  Blanchie Serve, MD  Patient Care Team: Blanchie Serve, MD as PCP - General (Internal Medicine) Clent Jacks, MD (Ophthalmology) Adrian Prows, MD as Attending Physician (Cardiology) [place], [facility] [name], MD as Consulting Physician (Orthopedic Surgery) Clance, Armando Reichert, MD as Consulting Physician (Pulmonary Disease) Nikitha Mode, Nelda Bucks, NP as Nurse Practitioner [facility] Medicine)  Extended Emergency Contact Information Primary Emergency Contact: Hagwood,[name] Address: [address]          [place], [place] [address] [name] of [place] Phone: [phone] Mobile Phone: [phone] Relation: Son Secondary Emergency Contact: [name] [place] of [place] Mobile Phone: [phone] Relation: Daughter  Code Status:  DNR Goals of care: Advanced Directive information Advanced Directives 01/21/2017  Does Patient Have a Medical Advance Directive? Yes  Type of Paramedic of [place];Out of facility DNR (pink MOST or yellow form);Living will  Does patient want to make changes to medical advance directive? -  Copy of [place] in Chart? Yes  Pre-existing out of facility DNR order (yellow form or pink MOST form) Yellow form placed in chart (order not valid for inpatient use)     Chief Complaint  Patient presents with  . Acute Visit    medication management    HPI:  Pt is a 83 y.o. female seen today at [facility] for an acute visit for medication management.she is seen in her room today.she complains of restless leg worst at bedtime.She states unable to sleep sometimes due to restless leg but overall has been sleeping well on her recliner.she is currently on trazodone 25 mg tablet at bedtime. Pharmacy recommend GDR to as needed.patient states willing to try trazodone as needed. She would like  medication for her RLS. She denies any fever or chills.  Past Medical History:  Diagnosis Date  . Abnormality of gait   . Anemia, unspecified   . Arthritis   . Bronchiectasis without acute exacerbation ([place]) 04/10/2011   CT chest 2011   . Cataract 1990   Dr. Katy Fitch  . Cholelithiases   . Closed fracture of unspecified part of ramus of mandible   . COPD (chronic obstructive pulmonary disease) ([place])   . Coronary atherosclerosis of unspecified type of vessel, native or graft   . Disturbance of skin sensation   . DOE (dyspnea on exertion) 04/10/2011   PFTs 2013:  FEV1 1.04 (78%), ratio 64, couldn't do maneuver for lung volumes or dlco   . Edema   . Esophageal reflux   . Fall from other slipping, tripping, or stumbling   . Gastric ulcer with hemorrhage 2008  . Hypertension   . Hypoxemia   . Insomnia, unspecified   . Mucopurulent chronic bronchitis ([place])   . Nodule of neck 11/07/2013   Left neck posteriorly   . Nontoxic uninodular goiter   . Osteoarthrosis, unspecified whether generalized or localized, unspecified site   . Other and unspecified hyperlipidemia   . Other malaise and fatigue   . Pain in joint, lower leg 2010   right knee  . Pain in joint, shoulder region 2013   left  . Palpitations   . Pneumonia, organism unspecified(486)   . Regional enteritis of unspecified site   . Rosacea   . Sciatica   . Squamous cell carcinoma of skin of lower limb, including hip 07/27/2012   Nonhealing lesion of the left  mid calf medially   . Tachycardia 12/06/2014  . Tension headache   . Tension headache   . Type II or unspecified type diabetes mellitus without mention of complication, not stated as uncontrolled   . Unspecified venous (peripheral) insufficiency   . Urine, incontinence, stress female 07/19/2013   Past Surgical History:  Procedure Laterality Date  . EYE SURGERY Bilateral 1990   Dr. Katy Fitch  . GASTROJEJUNOSTOMY  05/2004   secondary to ulcer  . JOINT REPLACEMENT Bilateral  1993-1998   total knee replacement  . MOLE REMOVAL  05/11/14   left hand and left side of face [facility]  . OTHER SURGICAL HISTORY  2008   Ulcer surgery   . SMALL INTESTINE SURGERY    . TOTAL KNEE ARTHROPLASTY Bilateral 1993, 1998   knees    Allergies  Allergen Reactions  . Adhesive [Tape]     unknown  . Aspirin     unknown  . Augmentin [Amoxicillin-Pot Clavulanate]   . Codeine     unknown  . Diclofenac   . Enalapril Cough  . Hydrocodone     unknown  . Pantoprazole Sodium     unknown  . Voltaren [Diclofenac Sodium]     Outpatient Encounter Medications as of 01/21/2017  Medication Sig  . acetaminophen (TYLENOL) 325 MG tablet Take 650 mg by mouth every 6 (six) hours as needed for moderate pain.   Marland Kitchen acetaminophen (TYLENOL) 500 MG tablet Take 1,000 mg by mouth at bedtime.   Marland Kitchen acetaminophen (TYLENOL) 500 MG tablet Take 500 mg by mouth daily.  . Amino Acids-Protein Hydrolys (FEEDING SUPPLEMENT, PRO-STAT SUGAR FREE 64,) LIQD Take 30 mLs by mouth daily.  . Fluticasone-Salmeterol (ADVAIR) 100-50 MCG/DOSE AEPB Inhale 1 puff into the lungs 2 (two) times daily.  . furosemide (LASIX) 40 MG tablet Take 40 mg by mouth 2 (two) times daily. Hold for SBP <110  . guaiFENesin (MUCINEX) 600 MG 12 hr tablet Take 600 mg by mouth 2 (two) times daily.  Marland Kitchen guaifenesin (ROBITUSSIN) 100 MG/5ML syrup Take by mouth every 6 (six) hours as needed for cough (Give 10 mL).  . insulin glargine (LANTUS) 100 UNIT/ML injection Inject 28 Units into the skin at bedtime.   . insulin lispro (HUMALOG) 100 UNIT/ML cartridge Inject 12 Units into the skin. At 1230 PM and 530 PM. (If blood sugar is 150-250 = 8 units, if blood sugar is greater than 250 = 12 units daily)  . ipratropium-albuterol (DUONEB) 0.5-2.5 (3) MG/3ML SOLN Take 3 mLs by nebulization every 8 (eight) hours as needed. In addition to the scheduled dose  . ipratropium-albuterol (DUONEB) 0.5-2.5 (3) MG/3ML SOLN Take 3 mLs by nebulization 2 (two) times  daily.  Marland Kitchen loperamide (IMODIUM A-D) 2 MG tablet Take 2 mg by mouth as needed for diarrhea or loose stools. Give 4 mg as the initial dose. Then give 2 mg after each loose stool  . loratadine (CLARITIN) 10 MG tablet Take 10 mg by mouth daily as needed for allergies.  Marland Kitchen losartan (COZAAR) 100 MG tablet Take 100 mg daily by mouth.  . metoprolol tartrate (LOPRESSOR) 25 MG tablet Take 37.5 mg by mouth 2 (two) times daily. Hold for SBP <110  . mineral oil-hydrophilic petrolatum (AQUAPHOR) ointment Apply 1 application daily as needed topically for dry skin (to the left upper arm).  . Multiple Vitamins-Minerals (MULTIVITAMIN WITH MINERALS) tablet Take 1 tablet daily by mouth.  Marland Kitchen omeprazole (PRILOSEC) 20 MG capsule Take 20 mg by mouth daily.   Marland Kitchen  OXYGEN Inhale into the lungs. 3 liter via nasal canula  . predniSONE (DELTASONE) 5 MG tablet Take 5 mg by mouth daily with breakfast.  . spironolactone (ALDACTONE) 25 MG tablet Take 12.5 mg by mouth daily.   . traZODone (DESYREL) 50 MG tablet Take 0.5 tablets (25 mg total) by mouth at bedtime as needed for sleep.  . [DISCONTINUED] traZODone (DESYREL) 50 MG tablet Take 0.5 tablets (25 mg total) by mouth at bedtime.  Marland Kitchen rOPINIRole (REQUIP) 0.25 MG tablet Take 1 tablet (0.25 mg total) by mouth at bedtime.   No facility-administered encounter medications on file as of 01/21/2017.     Review of Systems  Constitutional: Negative for appetite change, chills, fatigue and fever.  Respiratory: Negative for chest tightness, shortness of breath and wheezing.   Cardiovascular: Negative for chest pain, palpitations and leg swelling.  Gastrointestinal: Negative for abdominal distention, abdominal pain, constipation, diarrhea, nausea and vomiting.       LBM 01/20/2017  Musculoskeletal: Positive for gait problem.  Skin: Negative for color change, pallor and rash.  Neurological: Negative for dizziness, light-headedness and headaches.       Restless leg   Psychiatric/Behavioral:  Negative for agitation, confusion and sleep disturbance. The patient is not nervous/anxious.     Immunization History  Administered Date(s) Administered  . Influenza Split 11/06/2011, 11/04/2012  . Influenza,inj,Quad PF,6+ Mos 11/17/2013  . Influenza-Unspecified 11/09/2014, 11/16/2015, 11/25/2015, 11/13/2016  . PPD Test 06/19/2010, 11/29/2014  . Pneumococcal Conjugate-13 08/11/2014  . Pneumococcal Polysaccharide-23 02/04/2003  . Pneumococcal-Unspecified 06/07/2014  . Tdap 02/27/2013  . Zoster 02/03/2006   Pertinent  Health Maintenance Due  Topic Date Due  . DEXA SCAN  07/18/2026 (Originally 10/31/1984)  . FOOT EXAM  02/07/2017  . HEMOGLOBIN A1C  06/07/2017  . OPHTHALMOLOGY EXAM  07/22/2017  . INFLUENZA VACCINE  Completed  . PNA vac Low Risk Adult  Completed   Fall Risk  12/06/2014 07/11/2014 05/22/2014 11/07/2013 08/30/2013  Falls in the past year? Yes No Yes No No  Number falls in past yr: 1 - 1 - -  Injury with Fall? No - No - -  Risk for fall due to : History of fall(s);Impaired mobility - History of fall(s);Impaired balance/gait - Impaired balance/gait;Impaired mobility  Follow up - - Falls evaluation completed - -   Functional Status Survey:    Vitals:   01/21/17 1118  BP: (!) 156/78  Pulse: 70  Resp: 16  Temp: 98.8 F (37.1 C)  SpO2: 96%  Weight: 213 lb 9.6 oz (96.9 kg)  Height: 5\' 7"  (1.702 m)   Body mass index is 33.45 kg/m. Physical Exam  Constitutional: She is oriented to person, place, and time. She appears well-developed and well-nourished. No distress.  HENT:  Head: Normocephalic.  Mouth/Throat: Oropharynx is clear and moist. No oropharyngeal exudate.  Eyes: Conjunctivae and EOM are normal. Pupils are equal, round, and reactive to light. Right eye exhibits no discharge. Left eye exhibits no discharge. No scleral icterus.  Neck: Normal range of motion. No JVD present. No thyromegaly present.  Cardiovascular: Normal rate, regular rhythm, normal heart sounds  and intact distal pulses. Exam reveals no gallop and no friction rub.  No murmur heard. Pulmonary/Chest: Effort normal and breath sounds normal. No respiratory distress. She has no wheezes. She has no rales.  Abdominal: Soft. Bowel sounds are normal. She exhibits no distension. There is no tenderness. There is no rebound and no guarding.  Musculoskeletal: She exhibits no tenderness.  Unsteady gait self propels on wheelchair  and uses scooter for long distance.bilateral lower extremities knee high ted hose in place.  Lymphadenopathy:    She has no cervical adenopathy.  Neurological: She is oriented to person, place, and time. Coordination normal.  Skin: Skin is warm and dry. No rash noted. No erythema.  Psychiatric: She has a normal mood and affect.   Labs reviewed: Recent Labs    08/18/16 12/08/16 01/08/17  NA 139 136*  136 137  K 3.9 4.3  4.3 3.8  BUN 35* 40*  40* 46*  CREATININE 1.1 1.1  1.06 1.03  CALCIUM  --  9.0 9.0   Recent Labs    03/13/16 07/10/16 08/18/16  AST 16 14 20   ALT 11 12 17   ALKPHOS 80 86 80   Recent Labs    03/13/16 07/10/16 12/08/16  WBC 7.0 8.1 9.1  9.1  HGB 10.3* 9.8* 11.4*  11.4  HCT 32* 30* 34*  33.8  PLT 184 168 235   Lab Results  Component Value Date   TSH 2.61 07/10/2016   Lab Results  Component Value Date   HGBA1C 7.2 12/08/2016   Lab Results  Component Value Date   CHOL 117 12/15/2016   HDL 52 12/15/2016   LDLCALC 40 12/15/2016   TRIG 186 (A) 12/15/2016    Significant Diagnostic Results in last 30 days:  No results found.  Assessment/Plan 1. Insomnia Currently on trazodone 25 mg tablet at bedtime.Pharmacy recommend GDR.Will change trazodone 50 mg tablet to take 1/2 tablet by mouth at bedtime as needed. Continue to monitor.   2. Restless leg syndrome Keeps patient awake.Start Requip 0.25 mg tablet one by mouth at bedtime.    Family/ staff Communication: Reviewed plan of care with patient and facility Nurse.   Labs/tests  ordered: None   Antwonette Feliz C Jabbar Palmero, NP

## 2017-02-02 ENCOUNTER — Non-Acute Institutional Stay (SKILLED_NURSING_FACILITY): Payer: Medicare Other | Admitting: Family

## 2017-02-02 ENCOUNTER — Encounter: Payer: Self-pay | Admitting: Family

## 2017-02-02 DIAGNOSIS — Z0000 Encounter for general adult medical examination without abnormal findings: Secondary | ICD-10-CM | POA: Diagnosis not present

## 2017-02-02 NOTE — Progress Notes (Signed)
Subjective:   [name] is a 83 y.o. female who presents for an Initial Medicare Annual Wellness Visit.No complains of acute issues during visit.   Review of Systems  Cardiac Risk Factors include: obesity (BMI >30kg/m2);sedentary lifestyle;advanced age (>60men, >58 women)     Objective:    Today's Vitals   02/02/17 1129  BP: (!) 156/68  Pulse: 70  Resp: 15  Temp: 98 F (36.7 C)  SpO2: 98%  Weight: 213 lb 9.6 oz (96.9 kg)  Height: 5\' 7"  (1.702 m)   Body mass index is 33.45 kg/m.  Advanced Directives 02/02/2017 01/21/2017 01/09/2017 12/22/2016 12/05/2016 11/27/2016 11/10/2016  Does Patient Have a Medical Advance Directive? Yes Yes Yes Yes Yes Yes Yes  Type of Paramedic of [place];Out of facility DNR (pink MOST or yellow form);Living will [place];Out of facility DNR (pink MOST or yellow form);Living will [place];Out of facility DNR (pink MOST or yellow form);Living will [place];Out of facility DNR (pink MOST or yellow form);Living will [place];Living will;Out of facility DNR (pink MOST or yellow form) Out of facility DNR (pink MOST or yellow form);Living will;Healthcare Power of [place];Living will;Out of facility DNR (pink MOST or yellow form)  Does patient want to make changes to medical advance directive? - - - No - Patient declined No - Patient declined - No - Patient declined  Copy of [place] in Chart? Yes Yes Yes Yes Yes Yes Yes  Pre-existing out of facility DNR order (yellow form or pink MOST form) Yellow form placed in chart (order not valid for inpatient use) Yellow form placed in chart (order not valid for inpatient use) Yellow form placed in chart (order not valid for inpatient use) Yellow form placed in chart (order not valid for inpatient use) Yellow form placed in chart (order not valid for inpatient use) Yellow  form placed in chart (order not valid for inpatient use) Yellow form placed in chart (order not valid for inpatient use)    Current Medications (verified) Outpatient Encounter Medications as of 02/02/2017  Medication Sig  . acetaminophen (TYLENOL) 325 MG tablet Take 650 mg by mouth every 6 (six) hours as needed for moderate pain.   Marland Kitchen acetaminophen (TYLENOL) 500 MG tablet Take 1,000 mg by mouth at bedtime.   Marland Kitchen acetaminophen (TYLENOL) 500 MG tablet Take 500 mg by mouth daily.  . Amino Acids-Protein Hydrolys (FEEDING SUPPLEMENT, PRO-STAT SUGAR FREE 64,) LIQD Take 30 mLs by mouth daily.  . Fluticasone-Salmeterol (ADVAIR) 100-50 MCG/DOSE AEPB Inhale 1 puff into the lungs 2 (two) times daily.  . furosemide (LASIX) 40 MG tablet Take 40 mg by mouth 2 (two) times daily. Hold for SBP <110  . guaiFENesin (MUCINEX) 600 MG 12 hr tablet Take 600 mg by mouth 2 (two) times daily.  Marland Kitchen guaifenesin (ROBITUSSIN) 100 MG/5ML syrup Take by mouth every 6 (six) hours as needed for cough (Give 10 mL).  . insulin glargine (LANTUS) 100 UNIT/ML injection Inject 28 Units into the skin at bedtime.   . insulin lispro (HUMALOG) 100 UNIT/ML cartridge Inject 12 Units into the skin. At 1230 PM and 530 PM. (If blood sugar is 150-250 = 8 units, if blood sugar is greater than 250 = 12 units daily)  . ipratropium-albuterol (DUONEB) 0.5-2.5 (3) MG/3ML SOLN Take 3 mLs by nebulization every 8 (eight) hours as needed. In addition to the scheduled dose  . ipratropium-albuterol (DUONEB) 0.5-2.5 (  3) MG/3ML SOLN Take 3 mLs by nebulization 2 (two) times daily.  Marland Kitchen loperamide (IMODIUM A-D) 2 MG tablet Take 2 mg by mouth as needed for diarrhea or loose stools. Give 4 mg as the initial dose. Then give 2 mg after each loose stool  . loratadine (CLARITIN) 10 MG tablet Take 10 mg by mouth daily as needed for allergies.  Marland Kitchen losartan (COZAAR) 100 MG tablet Take 100 mg daily by mouth.  . metoprolol tartrate (LOPRESSOR) 25 MG tablet Take 37.5 mg by mouth 2  (two) times daily. Hold for SBP <110  . mineral oil-hydrophilic petrolatum (AQUAPHOR) ointment Apply 1 application daily as needed topically for dry skin (to the left upper arm).  . Multiple Vitamins-Minerals (MULTIVITAMIN WITH MINERALS) tablet Take 1 tablet daily by mouth.  Marland Kitchen omeprazole (PRILOSEC) 20 MG capsule Take 20 mg by mouth daily.   . OXYGEN Inhale into the lungs. 3 liter via nasal canula  . predniSONE (DELTASONE) 5 MG tablet Take 5 mg by mouth daily with breakfast.  . rOPINIRole (REQUIP) 0.25 MG tablet Take 1 tablet (0.25 mg total) by mouth at bedtime.  Marland Kitchen spironolactone (ALDACTONE) 25 MG tablet Take 12.5 mg by mouth daily.   . traZODone (DESYREL) 50 MG tablet Take 0.5 tablets (25 mg total) by mouth at bedtime as needed for sleep.   No facility-administered encounter medications on file as of 02/02/2017.     Allergies (verified) Adhesive [tape]; Aspirin; Augmentin [amoxicillin-pot clavulanate]; Codeine; Diclofenac; Enalapril; Hydrocodone; Pantoprazole sodium; and Voltaren [diclofenac sodium]   History: Past Medical History:  Diagnosis Date  . Abnormality of gait   . Anemia, unspecified   . Arthritis   . Bronchiectasis without acute exacerbation ([place]) 04/10/2011   CT chest 2011   . Cataract 1990   Dr. Katy Fitch  . Cholelithiases   . Closed fracture of unspecified part of ramus of mandible   . COPD (chronic obstructive pulmonary disease) ([place])   . Coronary atherosclerosis of unspecified type of vessel, native or graft   . Disturbance of skin sensation   . DOE (dyspnea on exertion) 04/10/2011   PFTs 2013:  FEV1 1.04 (78%), ratio 64, couldn't do maneuver for lung volumes or dlco   . Edema   . Esophageal reflux   . Fall from other slipping, tripping, or stumbling   . Gastric ulcer with hemorrhage 2008  . Hypertension   . Hypoxemia   . Insomnia, unspecified   . Mucopurulent chronic bronchitis ([place])   . Nodule of neck 11/07/2013   Left neck posteriorly   . Nontoxic uninodular  goiter   . Osteoarthrosis, unspecified whether generalized or localized, unspecified site   . Other and unspecified hyperlipidemia   . Other malaise and fatigue   . Pain in joint, lower leg 2010   right knee  . Pain in joint, shoulder region 2013   left  . Palpitations   . Pneumonia, organism unspecified(486)   . Regional enteritis of unspecified site   . Rosacea   . Sciatica   . Squamous cell carcinoma of skin of lower limb, including hip 07/27/2012   Nonhealing lesion of the left mid calf medially   . Tachycardia 12/06/2014  . Tension headache   . Tension headache   . Type II or unspecified type diabetes mellitus without mention of complication, not stated as uncontrolled   . Unspecified venous (peripheral) insufficiency   . Urine, incontinence, stress female 07/19/2013   Past Surgical History:  Procedure Laterality Date  . EYE SURGERY  Bilateral 1990   Dr. Katy Fitch  . GASTROJEJUNOSTOMY  05/2004   secondary to ulcer  . JOINT REPLACEMENT Bilateral 1993-1998   total knee replacement  . MOLE REMOVAL  05/11/14   left hand and left side of [facility]  . OTHER SURGICAL HISTORY  2008   Ulcer surgery   . SMALL INTESTINE SURGERY    . TOTAL KNEE ARTHROPLASTY Bilateral 1993, 1998   knees   Family History  Problem Relation Age of Onset  . Heart disease Father   . Colon cancer Sister   . Diabetes Brother   . Obesity Brother   . Mental retardation Daughter   . Obesity Daughter    Social History   Socioeconomic History  . Marital status: Widowed    Spouse name: None  . Number of children: 2  . Years of education: None  . Highest education level: None  Social Needs  . Financial resource strain: None  . Food insecurity - worry: None  . Food insecurity - inability: None  . Transportation needs - medical: None  . Transportation needs - non-medical: None  Occupational History  . Occupation: retired Agricultural engineer.   Tobacco Use  . Smoking status: Former Smoker     Packs/day: 0.30    Years: 10.00    Pack years: 3.00    Types: Cigarettes    Last attempt to quit: 02/03/1973    Years since quitting: 44.0  . Smokeless tobacco: Never Used  . Tobacco comment: former casual smoker, lots of second hand smoke exposure  Substance and Sexual Activity  . Alcohol use: No  . Drug use: No  . Sexual activity: No  Other Topics Concern  . None  Social History Narrative   Pt lives at [facility] in the independent living section since 1994. Moved to [place] 12/08/2013. Moved to skill unit 11/29/14   Widowed   Has a living will, POA, DNR   Power scooter and rolling walker   Exercise none   Never smoked   Alcohol none                 Tobacco Counseling Counseling given: Not Answered Comment: former casual smoker, lots of second hand smoke exposure   Clinical Intake:  Activities of Daily Living In your present state of health, do you have any difficulty performing the following activities: 02/02/2017  Hearing? Y  Vision? Y  Difficulty concentrating or making decisions? N  Walking or climbing stairs? Y  Dressing or bathing? Y  Doing errands, shopping? Y  Preparing Food and eating ? N  Using the Toilet? N  In the past six months, have you accidently leaked urine? Y  Do you have problems with loss of bowel control? N  Managing your Medications? Y  Managing your Finances? Y  Housekeeping or managing your Housekeeping? Y  Some recent data might be hidden   Immunizations and Health Maintenance Immunization History  Administered Date(s) Administered  . Influenza Split 11/06/2011, 11/04/2012  . Influenza,inj,Quad PF,6+ Mos 11/17/2013  . Influenza-Unspecified 11/09/2014, 11/16/2015, 11/25/2015, 11/13/2016  . PPD Test 06/19/2010, 11/29/2014  . Pneumococcal Conjugate-13 08/11/2014  . Pneumococcal Polysaccharide-23 02/04/2003  . Pneumococcal-Unspecified 06/07/2014  . Tdap 02/27/2013  . Zoster 02/03/2006   There are no preventive care reminders to  display for this patient.  Patient Care Team: Blanchie Serve, MD as PCP - General (Internal Medicine) Clent Jacks, MD (Ophthalmology) Adrian Prows, MD as Attending Physician (Cardiology) Pepper Pike, Friends [facility] [name], MD  as Consulting Physician (Orthopedic Surgery) Clance, Armando Reichert, MD as Consulting Physician (Pulmonary Disease) Ngetich, Nelda Bucks, NP as Nurse Practitioner (Family Medicine)  Indicate any recent Medical Services you may have received from other than [facility] providers in the past year (date may be approximate).     Assessment:   This is a routine wellness examination for [place].  Hearing/Vision screen No exam data present  Dietary issues and exercise activities discussed: Current Exercise Habits: The patient does not participate in regular exercise at present, Exercise limited by: respiratory conditions(s);Other - see comments  Goals    None     Depression Screen PHQ 2/9 Scores 02/02/2017 12/06/2014 07/11/2014 05/22/2014 11/07/2013 08/30/2013 08/23/2013  PHQ - 2 Score 0 0 0 0 0 0 0    Fall Risk Fall Risk  02/02/2017 12/06/2014 07/11/2014 05/22/2014 11/07/2013  Falls in the past year? - Yes No Yes No  Number falls in past yr: - 1 - 1 -  Injury with Fall? - No - No -  Risk for fall due to : History of fall(s);Impaired mobility History of fall(s);Impaired mobility - History of fall(s);Impaired balance/gait -  Follow up - - - Falls evaluation completed -    Is the patient's home free of loose throw rugs in walkways, pet beds, electrical cords, etc?   yes      Grab bars in the bathroom? yes      Handrails on the stairs?   no      Adequate lighting?   yes  Timed Get Up and Go Performed > 15 seconds   Cognitive Function:     6CIT Screen 02/02/2017  What Year? 0 points  What month? 0 points  What time? 0 points  Count back from 20 0 points  Months in reverse 0 points  Repeat phrase 0 points  Total Score 0    Screening Tests Health Maintenance  Topic Date Due  .  DEXA SCAN  07/18/2026 (Originally 10/31/1984)  . FOOT EXAM  02/07/2017  . HEMOGLOBIN A1C  06/07/2017  . OPHTHALMOLOGY EXAM  07/22/2017  . TETANUS/TDAP  02/28/2023  . INFLUENZA VACCINE  Completed  . PNA vac Low Risk Adult  Completed    Qualifies for Shingles Vaccine? None   Cancer Screenings: Lung: Low Dose CT Chest recommended if Age 27-80 years, 30 pack-year currently smoking OR have quit w/in 15years. Patient does not qualify. Breast: Up to date on Mammogram? Yes   Up to date of Bone Density/Dexa? No Colorectal:N/A due to advance age   Additional Screenings: Hepatitis B/HIV/Syphillis: Hepatitis C Screening:      Plan:     I have personally reviewed and noted the following in the patient's chart:   . Medical and social history . Use of alcohol, tobacco or illicit drugs  . Current medications and supplements . Functional ability and status . Nutritional status . Physical activity . Advanced directives . List of other physicians . Hospitalizations, surgeries, and ER visits in previous 12 months . Vitals . Screenings to include cognitive, depression, and falls . Referrals and appointments  In addition, I have reviewed and discussed with patient certain preventive protocols, quality metrics, and best practice recommendations. A written personalized care plan for preventive services as well as general preventive health recommendations were provided to patient.     Sandrea Hughs, NP   02/02/2017

## 2017-02-10 ENCOUNTER — Encounter: Payer: Self-pay | Admitting: *Deleted

## 2017-02-10 ENCOUNTER — Non-Acute Institutional Stay (SKILLED_NURSING_FACILITY): Payer: Medicare Other | Admitting: Family

## 2017-02-10 ENCOUNTER — Encounter: Payer: Self-pay | Admitting: Family

## 2017-02-10 DIAGNOSIS — M25561 Pain in right knee: Secondary | ICD-10-CM

## 2017-02-10 DIAGNOSIS — G8929 Other chronic pain: Secondary | ICD-10-CM | POA: Diagnosis not present

## 2017-02-10 DIAGNOSIS — M25562 Pain in left knee: Secondary | ICD-10-CM

## 2017-02-10 NOTE — Progress Notes (Signed)
Location:  [place] Room Number: 16 Place of Service:  SNF ([phone]) Provider: Monika Chestang FNP-C  Blanchie Serve, MD  Patient Care Team: Blanchie Serve, MD as PCP - General (Internal Medicine) Clent Jacks, MD (Ophthalmology) Adrian Prows, MD as Attending Physician (Cardiology) [place], [facility] [name], MD as Consulting Physician (Orthopedic Surgery) Clance, Armando Reichert, MD as Consulting Physician (Pulmonary Disease) Aydeen Blume, Nelda Bucks, NP as Nurse Practitioner [facility] Medicine)  Extended Emergency Contact Information Primary Emergency Contact: Nanez,[name] Address: [address]          [place], [place] [address] [name] of [place] Phone: [phone] Mobile Phone: [phone] Relation: Son Secondary Emergency Contact: [name] [place] of [place] Mobile Phone: [phone] Relation: Daughter  Code Status:  DNR Goals of care: Advanced Directive information Advanced Directives 02/10/2017  Does Patient Have a Medical Advance Directive? Yes  Type of Paramedic of [place];Out of facility DNR (pink MOST or yellow form);Living will  Does patient want to make changes to medical advance directive? -  Copy of [place] in Chart? Yes  Pre-existing out of facility DNR order (yellow form or pink MOST form) Yellow form placed in chart (order not valid for inpatient use)     Chief Complaint  Patient presents with  . Acute Visit    knee pain with activity    HPI:  Pt is a 84 y.o. female seen today at [facility] for an acute visit for evaluation of knee pain.She is seen in her room today per facility Nurse request.Nurse reports patient complain of knee pain worst with standing.Patient states has chronic knee pain due to arthritis. She states had knee pain  one day ago.she describe pain as sharp and worst with bearing weight. She states feels much better today.of note she has had a total knee  replacement.she denies any swelling or redness to knee.     Past Medical History:  Diagnosis Date  . Abnormality of gait   . Anemia, unspecified   . Arthritis   . Bronchiectasis without acute exacerbation ([place]) 04/10/2011   CT chest 2011   . Cataract 1990   Dr. Katy Fitch  . Cholelithiases   . Closed fracture of unspecified part of ramus of mandible   . COPD (chronic obstructive pulmonary disease) ([place])   . Coronary atherosclerosis of unspecified type of vessel, native or graft   . Disturbance of skin sensation   . DOE (dyspnea on exertion) 04/10/2011   PFTs 2013:  FEV1 1.04 (78%), ratio 64, couldn't do maneuver for lung volumes or dlco   . Edema   . Esophageal reflux   . Fall from other slipping, tripping, or stumbling   . Gastric ulcer with hemorrhage 2008  . Hypertension   . Hypoxemia   . Insomnia, unspecified   . Mucopurulent chronic bronchitis ([place])   . Nodule of neck 11/07/2013   Left neck posteriorly   . Nontoxic uninodular goiter   . Osteoarthrosis, unspecified whether generalized or localized, unspecified site   . Other and unspecified hyperlipidemia   . Other malaise and fatigue   . Pain in joint, lower leg 2010   right knee  . Pain in joint, shoulder region 2013   left  . Palpitations   . Pneumonia, organism unspecified(486)   . Regional enteritis of unspecified site   . Rosacea   . Sciatica   . Squamous cell carcinoma of skin of lower limb, including hip 07/27/2012  Nonhealing lesion of the left mid calf medially   . Tachycardia 12/06/2014  . Tension headache   . Tension headache   . Type II or unspecified type diabetes mellitus without mention of complication, not stated as uncontrolled   . Unspecified venous (peripheral) insufficiency   . Urine, incontinence, stress female 07/19/2013   Past Surgical History:  Procedure Laterality Date  . EYE SURGERY Bilateral 1990   Dr. Katy Fitch  . GASTROJEJUNOSTOMY  05/2004   secondary to ulcer  . JOINT REPLACEMENT Bilateral  1993-1998   total knee replacement  . MOLE REMOVAL  05/11/14   left hand and left side of face [facility]  . OTHER SURGICAL HISTORY  2008   Ulcer surgery   . SMALL INTESTINE SURGERY    . TOTAL KNEE ARTHROPLASTY Bilateral 1993, 1998   knees    Allergies  Allergen Reactions  . Adhesive [Tape]     unknown  . Aspirin     unknown  . Augmentin [Amoxicillin-Pot Clavulanate]   . Codeine     unknown  . Diclofenac   . Enalapril Cough  . Hydrocodone     unknown  . Pantoprazole Sodium     unknown  . Voltaren [Diclofenac Sodium]     Outpatient Encounter Medications as of 02/10/2017  Medication Sig  . acetaminophen (TYLENOL) 325 MG tablet Take 650 mg by mouth every 6 (six) hours as needed for moderate pain.   Marland Kitchen acetaminophen (TYLENOL) 500 MG tablet Take 1,000 mg by mouth at bedtime.   Marland Kitchen acetaminophen (TYLENOL) 500 MG tablet Take 500 mg by mouth daily.  . Fluticasone-Salmeterol (ADVAIR) 100-50 MCG/DOSE AEPB Inhale 1 puff into the lungs 2 (two) times daily.  . furosemide (LASIX) 40 MG tablet Take 40 mg by mouth 2 (two) times daily. Hold for SBP <110  . guaiFENesin (MUCINEX) 600 MG 12 hr tablet Take 600 mg by mouth 2 (two) times daily.  Marland Kitchen guaifenesin (ROBITUSSIN) 100 MG/5ML syrup Take by mouth every 6 (six) hours as needed for cough (Give 10 mL).  . insulin glargine (LANTUS) 100 UNIT/ML injection Inject 28 Units into the skin at bedtime.   . insulin lispro (HUMALOG) 100 UNIT/ML cartridge Inject 12 Units into the skin. At 1230 PM and 530 PM. (If blood sugar is 150-250 = 8 units, if blood sugar is greater than 250 = 12 units daily)  . ipratropium-albuterol (DUONEB) 0.5-2.5 (3) MG/3ML SOLN Take 3 mLs by nebulization every 8 (eight) hours as needed. In addition to the scheduled dose  . ipratropium-albuterol (DUONEB) 0.5-2.5 (3) MG/3ML SOLN Take 3 mLs by nebulization 2 (two) times daily.  Marland Kitchen loperamide (IMODIUM A-D) 2 MG tablet Take 2 mg by mouth as needed for diarrhea or loose stools. Give 4  mg as the initial dose. Then give 2 mg after each loose stool  . loratadine (CLARITIN) 10 MG tablet Take 10 mg by mouth daily as needed for allergies.  Marland Kitchen losartan (COZAAR) 100 MG tablet Take 100 mg daily by mouth.  . metoprolol tartrate (LOPRESSOR) 25 MG tablet Take 37.5 mg by mouth 2 (two) times daily. Hold for SBP <110  . mineral oil-hydrophilic petrolatum (AQUAPHOR) ointment Apply 1 application daily as needed topically for dry skin (to the left upper arm).  . Multiple Vitamins-Minerals (MULTIVITAMIN WITH MINERALS) tablet Take 1 tablet daily by mouth.  Marland Kitchen omeprazole (PRILOSEC) 20 MG capsule Take 20 mg by mouth daily.   . OXYGEN Inhale 1.5 L/min into the lungs. 3 liter via nasal canula   .  predniSONE (DELTASONE) 5 MG tablet Take 5 mg by mouth daily with breakfast.  . rOPINIRole (REQUIP) 0.25 MG tablet Take 1 tablet (0.25 mg total) by mouth at bedtime.  Marland Kitchen spironolactone (ALDACTONE) 25 MG tablet Take 12.5 mg by mouth daily.   . traZODone (DESYREL) 50 MG tablet Take 0.5 tablets (25 mg total) by mouth at bedtime as needed for sleep.  . [DISCONTINUED] Amino Acids-Protein Hydrolys (FEEDING SUPPLEMENT, PRO-STAT SUGAR FREE 64,) LIQD Take 30 mLs by mouth daily.   No facility-administered encounter medications on file as of 02/10/2017.     Review of Systems  Constitutional: Negative for chills and fever.  Respiratory: Negative for chest tightness, shortness of breath and wheezing.        Chronic cough on oxygen via nasal cannula  Cardiovascular: Negative for chest pain, palpitations and leg swelling.  Gastrointestinal: Negative for abdominal distention, abdominal pain, constipation, diarrhea, nausea and vomiting.  Musculoskeletal: Positive for arthralgias and gait problem.  Skin: Negative for color change and pallor.  Neurological: Negative for weakness and numbness.  Psychiatric/Behavioral: Negative for agitation, confusion and sleep disturbance. The patient is not nervous/anxious.      Immunization History  Administered Date(s) Administered  . Influenza Split 11/06/2011, 11/04/2012  . Influenza,inj,Quad PF,6+ Mos 11/17/2013  . Influenza-Unspecified 11/09/2014, 11/16/2015, 11/25/2015, 11/13/2016  . PPD Test 06/19/2010, 11/29/2014  . Pneumococcal Conjugate-13 08/11/2014  . Pneumococcal Polysaccharide-23 02/04/2003  . Pneumococcal-Unspecified 06/07/2014  . Tdap 02/27/2013  . Zoster 02/03/2006   Pertinent  Health Maintenance Due  Topic Date Due  . FOOT EXAM  02/07/2017  . DEXA SCAN  07/18/2026 (Originally 10/31/1984)  . HEMOGLOBIN A1C  06/07/2017  . OPHTHALMOLOGY EXAM  07/22/2017  . INFLUENZA VACCINE  Completed  . PNA vac Low Risk Adult  Completed   Fall Risk  02/02/2017 12/06/2014 07/11/2014 05/22/2014 11/07/2013  Falls in the past year? - Yes No Yes No  Number falls in past yr: - 1 - 1 -  Injury with Fall? - No - No -  Risk for fall due to : History of fall(s);Impaired mobility History of fall(s);Impaired mobility - History of fall(s);Impaired balance/gait -  Follow up - - - Falls evaluation completed -    Vitals:   02/10/17 1155  BP: 124/62  Pulse: 67  Resp: 20  Temp: 98.9 F (37.2 C)  SpO2: 96%  Weight: 212 lb 14.4 oz (96.6 kg)  Height: 5\' 7"  (1.702 m)   Body mass index is 33.34 kg/m. Physical Exam  Constitutional: She is oriented to person, place, and time. She appears well-developed and well-nourished. No distress.  Elderly in no acute distress   HENT:  Head: Normocephalic.  Mouth/Throat: Oropharynx is clear and moist. No oropharyngeal exudate.  Eyes: Conjunctivae and EOM are normal. Pupils are equal, round, and reactive to light. Right eye exhibits no discharge. Left eye exhibits no discharge. No scleral icterus.  Cardiovascular: Normal rate, regular rhythm, normal heart sounds and intact distal pulses. Exam reveals no gallop and no friction rub.  No murmur heard. Pulmonary/Chest: Effort normal and breath sounds normal. No respiratory distress.  She has no wheezes. She has no rales.  Oxygen 1.5 Liters via Nasal cannula   Abdominal: Soft. Bowel sounds are normal. She exhibits no distension. There is no tenderness. There is no rebound and no guarding.  Musculoskeletal: She exhibits no edema or tenderness.  Unsteady gait uses FWW for short distance and power wheelchair for long distances. Knees non-tender to palpation, without any redness,swelling or crepitus noted.  Neurological: She is oriented to person, place, and time. Coordination normal.  Skin: Skin is warm and dry. No rash noted. No erythema. No pallor.  Psychiatric: She has a normal mood and affect.   Labs reviewed: Recent Labs    08/18/16 12/08/16 01/08/17  NA 139 136*  136 137  K 3.9 4.3  4.3 3.8  BUN 35* 40*  40* 46*  CREATININE 1.1 1.1  1.06 1.03  CALCIUM  --  9.0 9.0   Recent Labs    03/13/16 07/10/16 08/18/16  AST 16 14 20   ALT 11 12 17   ALKPHOS 80 86 80   Recent Labs    03/13/16 07/10/16 12/08/16  WBC 7.0 8.1 9.1  9.1  HGB 10.3* 9.8* 11.4*  11.4  HCT 32* 30* 34*  33.8  PLT 184 168 235   Lab Results  Component Value Date   TSH 2.61 07/10/2016   Lab Results  Component Value Date   HGBA1C 7.2 12/08/2016   Lab Results  Component Value Date   CHOL 117 12/15/2016   HDL 52 12/15/2016   LDLCALC 40 12/15/2016   TRIG 186 (A) 12/15/2016    Significant Diagnostic Results in last 30 days:  No results found.  Assessment/Plan   Chronic pain of both knees Had pain with weight bearing previous day but none today.Negative knee exam.Continue on Tylenol 1000 mg tablet at bedtime and 500 mg tablet daily. Add Aspercreme 10 % cream one application three times daily for pain      Family/ staff Communication: Reviewed plan of care with patient and facility Nurse.  Labs/tests ordered: None   Deadra Diggins C Emmerson Taddei, NP

## 2017-02-10 NOTE — Progress Notes (Signed)
Opened in error

## 2017-02-20 ENCOUNTER — Encounter: Payer: Self-pay | Admitting: Family

## 2017-02-20 ENCOUNTER — Non-Acute Institutional Stay (SKILLED_NURSING_FACILITY): Payer: Medicare Other | Admitting: Family

## 2017-02-20 DIAGNOSIS — G2581 Restless legs syndrome: Secondary | ICD-10-CM | POA: Diagnosis not present

## 2017-02-20 DIAGNOSIS — G4700 Insomnia, unspecified: Secondary | ICD-10-CM | POA: Diagnosis not present

## 2017-02-20 NOTE — Progress Notes (Signed)
Location:  [facility] Room Number: 16 Place of Service:  SNF ([phone]) Provider: Teigen Parslow FNP-C  Blanchie Serve, MD  Patient Care Team: Blanchie Serve, MD as PCP - General (Internal Medicine) Clent Jacks, MD (Ophthalmology) Adrian Prows, MD as Attending Physician (Cardiology) [place], Friends [facility] [name], MD as Consulting Physician (Orthopedic Surgery) Clance, Armando Reichert, MD as Consulting Physician (Pulmonary Disease) [place], Nelda Bucks, NP as Nurse Practitioner [facility] Medicine)  Extended Emergency Contact Information Primary Emergency Contact: Mandel,[name] Address: [address]          [place],  [address] [name] of [place] Phone: [phone] Mobile Phone: [phone] Relation: Son Secondary Emergency Contact: [name] [place] of [place] Mobile Phone: [phone] Relation: Daughter  Code Status:  DNR Goals of care: Advanced Directive information Advanced Directives 02/20/2017  Does Patient Have a Medical Advance Directive? Yes  Type of Paramedic of [place];Out of facility DNR (pink MOST or yellow form);Living will  Does patient want to make changes to medical advance directive? -  Copy of [place] in Chart? Yes  Pre-existing out of facility DNR order (yellow form or pink MOST form) Yellow form placed in chart (order not valid for inpatient use)     Chief Complaint  Patient presents with  . Acute Visit    medication management-patient says Ropinirole is making her sick and refuses to take it    HPI:  Pt is a 84 y.o. female seen today at [facility] for an acute visit for evaluation of her medication.she is seen in her room today per facility Nurse request.Nurse reports patient has been refusing to take her ropinirole at bedtime states makes her sick on her stomach.Patient states hasn't noticed any difference with missing dose.she complains of inability to sleep at  night.States currently taking trazodone 25 mg tablet at bedtime.   Past Medical History:  Diagnosis Date  . Abnormality of gait   . Anemia, unspecified   . Arthritis   . Bronchiectasis without acute exacerbation ([place]) 04/10/2011   CT chest 2011   . Cataract 1990   Dr. Katy Fitch  . Cholelithiases   . Closed fracture of unspecified part of ramus of mandible   . COPD (chronic obstructive pulmonary disease) ([place])   . Coronary atherosclerosis of unspecified type of vessel, native or graft   . Disturbance of skin sensation   . DOE (dyspnea on exertion) 04/10/2011   PFTs 2013:  FEV1 1.04 (78%), ratio 64, couldn't do maneuver for lung volumes or dlco   . Edema   . Esophageal reflux   . Fall from other slipping, tripping, or stumbling   . Gastric ulcer with hemorrhage 2008  . Hypertension   . Hypoxemia   . Insomnia, unspecified   . Mucopurulent chronic bronchitis ([place])   . Nodule of neck 11/07/2013   Left neck posteriorly   . Nontoxic uninodular goiter   . Osteoarthrosis, unspecified whether generalized or localized, unspecified site   . Other and unspecified hyperlipidemia   . Other malaise and fatigue   . Pain in joint, lower leg 2010   right knee  . Pain in joint, shoulder region 2013   left  . Palpitations   . Pneumonia, organism unspecified(486)   . Regional enteritis of unspecified site   . Rosacea   . Sciatica   . Squamous cell carcinoma of skin of lower limb, including hip 07/27/2012   Nonhealing lesion of the left mid  calf medially   . Tachycardia 12/06/2014  . Tension headache   . Tension headache   . Type II or unspecified type diabetes mellitus without mention of complication, not stated as uncontrolled   . Unspecified venous (peripheral) insufficiency   . Urine, incontinence, stress female 07/19/2013   Past Surgical History:  Procedure Laterality Date  . EYE SURGERY Bilateral 1990   Dr. Katy Fitch  . GASTROJEJUNOSTOMY  05/2004   secondary to ulcer  . JOINT REPLACEMENT  Bilateral 1993-1998   total knee replacement  . MOLE REMOVAL  05/11/14   left hand and left side of face [facility]  . OTHER SURGICAL HISTORY  2008   Ulcer surgery   . SMALL INTESTINE SURGERY    . TOTAL KNEE ARTHROPLASTY Bilateral 1993, 1998   knees    Allergies  Allergen Reactions  . Adhesive [Tape]     unknown  . Aspirin     unknown  . Augmentin [Amoxicillin-Pot Clavulanate]   . Codeine     unknown  . Diclofenac   . Enalapril Cough  . Hydrocodone     unknown  . Pantoprazole Sodium     unknown  . Voltaren [Diclofenac Sodium]     Outpatient Encounter Medications as of 02/20/2017  Medication Sig  . acetaminophen (TYLENOL) 325 MG tablet Take 650 mg by mouth every 6 (six) hours as needed for moderate pain.   Marland Kitchen acetaminophen (TYLENOL) 500 MG tablet Take 1,000 mg by mouth at bedtime.   Marland Kitchen acetaminophen (TYLENOL) 500 MG tablet Take 500 mg by mouth daily.  . Fluticasone-Salmeterol (ADVAIR) 100-50 MCG/DOSE AEPB Inhale 1 puff into the lungs 2 (two) times daily.  . furosemide (LASIX) 40 MG tablet Take 40 mg by mouth 2 (two) times daily. Hold for SBP <110  . guaiFENesin (MUCINEX) 600 MG 12 hr tablet Take 600 mg by mouth 2 (two) times daily.  Marland Kitchen guaifenesin (ROBITUSSIN) 100 MG/5ML syrup Take by mouth every 6 (six) hours as needed for cough (Give 10 mL).  . insulin glargine (LANTUS) 100 UNIT/ML injection Inject 28 Units into the skin at bedtime.   . insulin lispro (HUMALOG) 100 UNIT/ML cartridge Inject 12 Units into the skin. At 1230 PM and 530 PM. (If blood sugar is 150-250 = 8 units, if blood sugar is greater than 250 = 12 units daily)  . ipratropium-albuterol (DUONEB) 0.5-2.5 (3) MG/3ML SOLN Take 3 mLs by nebulization every 8 (eight) hours as needed. In addition to the scheduled dose  . ipratropium-albuterol (DUONEB) 0.5-2.5 (3) MG/3ML SOLN Take 3 mLs by nebulization 2 (two) times daily.  Marland Kitchen loperamide (IMODIUM A-D) 2 MG tablet Take 2 mg by mouth as needed for diarrhea or loose  stools. Give 4 mg as the initial dose. Then give 2 mg after each loose stool  . loratadine (CLARITIN) 10 MG tablet Take 10 mg by mouth daily as needed for allergies.  Marland Kitchen losartan (COZAAR) 100 MG tablet Take 100 mg daily by mouth.  . metoprolol tartrate (LOPRESSOR) 25 MG tablet Take 37.5 mg by mouth 2 (two) times daily. Hold for SBP <110  . mineral oil-hydrophilic petrolatum (AQUAPHOR) ointment Apply 1 application daily as needed topically for dry skin (to the left upper arm).  . Multiple Vitamins-Minerals (MULTIVITAMIN WITH MINERALS) tablet Take 1 tablet daily by mouth.  Marland Kitchen omeprazole (PRILOSEC) 20 MG capsule Take 20 mg by mouth daily.   . OXYGEN Inhale 3 L/min into the lungs.   . predniSONE (DELTASONE) 5 MG tablet Take 5 mg  by mouth daily with breakfast.  . spironolactone (ALDACTONE) 25 MG tablet Take 12.5 mg by mouth daily.   . traZODone (DESYREL) 50 MG tablet Take 0.5 tablets (25 mg total) by mouth at bedtime as needed for sleep.  Marland Kitchen rOPINIRole (REQUIP) 0.25 MG tablet Take 1 tablet (0.25 mg total) by mouth at bedtime. (Patient not taking: Reported on 02/20/2017)   No facility-administered encounter medications on file as of 02/20/2017.     Review of Systems  Constitutional: Negative for appetite change, chills and fatigue.  Respiratory: Negative for chest tightness and shortness of breath.        Chronic cough   Cardiovascular: Negative for chest pain, palpitations and leg swelling.  Gastrointestinal: Negative for abdominal distention, abdominal pain, constipation, diarrhea, nausea and vomiting.  Musculoskeletal: Positive for gait problem.  Skin: Negative for color change, pallor and rash.  Neurological: Negative for tremors and seizures.  Psychiatric/Behavioral: Positive for sleep disturbance. Negative for agitation and confusion. The patient is not nervous/anxious.     Immunization History  Administered Date(s) Administered  . Influenza Split 11/06/2011, 11/04/2012  .  Influenza,inj,Quad PF,6+ Mos 11/17/2013  . Influenza-Unspecified 11/09/2014, 11/16/2015, 11/25/2015, 11/13/2016  . PPD Test 06/19/2010, 11/29/2014  . Pneumococcal Conjugate-13 08/11/2014  . Pneumococcal Polysaccharide-23 02/04/2003  . Pneumococcal-Unspecified 06/07/2014  . Tdap 02/27/2013  . Zoster 02/03/2006   Pertinent  Health Maintenance Due  Topic Date Due  . FOOT EXAM  02/07/2017  . DEXA SCAN  07/18/2026 (Originally 10/31/1984)  . HEMOGLOBIN A1C  06/07/2017  . OPHTHALMOLOGY EXAM  07/22/2017  . INFLUENZA VACCINE  Completed  . PNA vac Low Risk Adult  Completed   Fall Risk  02/02/2017 12/06/2014 07/11/2014 05/22/2014 11/07/2013  Falls in the past year? - Yes No Yes No  Number falls in past yr: - 1 - 1 -  Injury with Fall? - No - No -  Risk for fall due to : History of fall(s);Impaired mobility History of fall(s);Impaired mobility - History of fall(s);Impaired balance/gait -  Follow up - - - Falls evaluation completed -   Functional Status Survey:    Vitals:   02/20/17 1030  BP: (!) 143/64  Pulse: 81  Resp: 20  Temp: 98.4 F (36.9 C)  SpO2: 97%  Weight: 212 lb 14.4 oz (96.6 kg)  Height: 5\' 7"  (1.702 m)   Body mass index is 33.34 kg/m. Physical Exam  Constitutional: She is oriented to person, place, and time. She appears well-developed and well-nourished.  Elderly in no acute distress   HENT:  Head: Normocephalic.  Mouth/Throat: Oropharynx is clear and moist. No oropharyngeal exudate.  Eyes: Conjunctivae and EOM are normal. Pupils are equal, round, and reactive to light. Right eye exhibits no discharge. Left eye exhibits no discharge. No scleral icterus.  Neck: Normal range of motion. No JVD present. No thyromegaly present.  Cardiovascular: Normal rate, regular rhythm, normal heart sounds and intact distal pulses. Exam reveals no gallop and no friction rub.  No murmur heard. Pulmonary/Chest: Effort normal and breath sounds normal. No respiratory distress. She has no  wheezes. She has no rales.  Abdominal: Soft. Bowel sounds are normal. She exhibits no distension. There is no tenderness. There is no rebound and no guarding.  Lymphadenopathy:    She has no cervical adenopathy.  Neurological: She is oriented to person, place, and time. Coordination normal.  Skin: Skin is warm and dry. No rash noted. No erythema.  Psychiatric: She has a normal mood and affect.   Labs reviewed: Recent  Labs    08/18/16 12/08/16 01/08/17  NA 139 136*  136 137  K 3.9 4.3  4.3 3.8  BUN 35* 40*  40* 46*  CREATININE 1.1 1.1  1.06 1.03  CALCIUM  --  9.0 9.0   Recent Labs    03/13/16 07/10/16 08/18/16  AST 16 14 20   ALT 11 12 17   ALKPHOS 80 86 80   Recent Labs    03/13/16 07/10/16 12/08/16  WBC 7.0 8.1 9.1  9.1  HGB 10.3* 9.8* 11.4*  11.4  HCT 32* 30* 34*  33.8  PLT 184 168 235   Lab Results  Component Value Date   TSH 2.61 07/10/2016   Lab Results  Component Value Date   HGBA1C 7.2 12/08/2016   Lab Results  Component Value Date   CHOL 117 12/15/2016   HDL 52 12/15/2016   LDLCALC 40 12/15/2016   TRIG 186 (A) 12/15/2016    Significant Diagnostic Results in last 30 days:  No results found.  Assessment/Plan 1. RLS (restless legs syndrome) Has been refusing to take Requip at bedtime no rest leg symptoms without the medication noted. Will discontinue Requip for now and monitor.   2. Insomnia  Trazodone 25 mg tablet PRN ineffective.will schedule  trazodone 50 mg tablet to one by mouth at bedtime.Also discussed sleep hygiene switch TV off at bedtime.continue to monitor.  Family/ staff Communication: Reviewed plan of care with patient and facility Nurse.   Labs/tests ordered: None   Jaylan Duggar C Janyah Singleterry, NP

## 2017-02-23 ENCOUNTER — Non-Acute Institutional Stay (SKILLED_NURSING_FACILITY): Payer: Medicare Other | Admitting: Internal Medicine

## 2017-02-23 ENCOUNTER — Encounter: Payer: Self-pay | Admitting: Internal Medicine

## 2017-02-23 DIAGNOSIS — G4709 Other insomnia: Secondary | ICD-10-CM

## 2017-02-23 DIAGNOSIS — I5042 Chronic combined systolic (congestive) and diastolic (congestive) heart failure: Secondary | ICD-10-CM

## 2017-02-23 DIAGNOSIS — R05 Cough: Secondary | ICD-10-CM | POA: Diagnosis not present

## 2017-02-23 DIAGNOSIS — J9611 Chronic respiratory failure with hypoxia: Secondary | ICD-10-CM

## 2017-02-23 DIAGNOSIS — R059 Cough, unspecified: Secondary | ICD-10-CM

## 2017-02-23 NOTE — Progress Notes (Signed)
Location:  [place] Room Number: 16 Place of Service:  SNF [phone]) Provider:  Blanchie Serve MD  Blanchie Serve, MD  Patient Care Team: Blanchie Serve, MD as PCP - General (Internal Medicine) Clent Jacks, MD (Ophthalmology) Adrian Prows, MD as Attending Physician (Cardiology) [place], [facility] [name], MD as Consulting Physician (Orthopedic Surgery) Clance, Armando Reichert, MD as Consulting Physician (Pulmonary Disease) Ngetich, Nelda Bucks, NP as Nurse Practitioner [facility])  Extended Emergency Contact Information Primary Emergency Contact: Friedlander,[name] Address: [address]          [place], [place] [address] [name] of [place] Phone: [phone] Mobile Phone: [phone] Relation: Son Secondary Emergency Contact: [name] [place] of [place] Mobile Phone: [phone] Relation: Daughter  Code Status:  DNR  Goals of care: Advanced Directive information Advanced Directives 02/23/2017  Does Patient Have a Medical Advance Directive? Yes  Type of Paramedic of [place];Living will;Out of facility DNR (pink MOST or yellow form)  Does patient want to make changes to medical advance directive? No - Patient declined  Copy of [place] in Chart? Yes  Pre-existing out of facility DNR order (yellow form or pink MOST form) Yellow form placed in chart (order not valid for inpatient use);Pink MOST form placed in chart (order not valid for inpatient use)     Chief Complaint  Patient presents with  . Medical Management of Chronic Issues    Routine Visit     HPI:  Pt is a 84 y.o. female seen today for medical management of chronic diseases. Her breathing is stable. She is out of bed daily. No fall reported. Her arthritis pain bothers her and current regimen is helpful. She is hard of hearing. She has been having increased cough for 2 days. Denies any worsening dyspnea. Sleeping better with  trazodone dose increased.    Past Medical History:  Diagnosis Date  . Abnormality of gait   . Anemia, unspecified   . Arthritis   . Bronchiectasis without acute exacerbation ([place]) 04/10/2011   CT chest 2011   . Cataract 1990   Dr. Katy Fitch  . Cholelithiases   . Closed fracture of unspecified part of ramus of mandible   . COPD (chronic obstructive pulmonary disease) ([place])   . Coronary atherosclerosis of unspecified type of vessel, native or graft   . Disturbance of skin sensation   . DOE (dyspnea on exertion) 04/10/2011   PFTs 2013:  FEV1 1.04 (78%), ratio 64, couldn't do maneuver for lung volumes or dlco   . Edema   . Esophageal reflux   . Fall from other slipping, tripping, or stumbling   . Gastric ulcer with hemorrhage 2008  . Hypertension   . Hypoxemia   . Insomnia, unspecified   . Mucopurulent chronic bronchitis ([place])   . Nodule of neck 11/07/2013   Left neck posteriorly   . Nontoxic uninodular goiter   . Osteoarthrosis, unspecified whether generalized or localized, unspecified site   . Other and unspecified hyperlipidemia   . Other malaise and fatigue   . Pain in joint, lower leg 2010   right knee  . Pain in joint, shoulder region 2013   left  . Palpitations   . Pneumonia, organism unspecified(486)   . Regional enteritis of unspecified site   . Rosacea   . Sciatica   . Squamous cell carcinoma of skin of lower limb, including hip 07/27/2012   Nonhealing lesion of the left mid calf  medially   . Tachycardia 12/06/2014  . Tension headache   . Tension headache   . Type II or unspecified type diabetes mellitus without mention of complication, not stated as uncontrolled   . Unspecified venous (peripheral) insufficiency   . Urine, incontinence, stress female 07/19/2013   Past Surgical History:  Procedure Laterality Date  . EYE SURGERY Bilateral 1990   Dr. Katy Fitch  . GASTROJEJUNOSTOMY  05/2004   secondary to ulcer  . JOINT REPLACEMENT Bilateral 1993-1998   total knee  replacement  . MOLE REMOVAL  05/11/14   left hand and left side of face [facility]  . OTHER SURGICAL HISTORY  2008   Ulcer surgery   . SMALL INTESTINE SURGERY    . TOTAL KNEE ARTHROPLASTY Bilateral 1993, 1998   knees    Allergies  Allergen Reactions  . Adhesive [Tape]     unknown  . Aspirin     unknown  . Augmentin [Amoxicillin-Pot Clavulanate]   . Codeine     unknown  . Diclofenac   . Enalapril Cough  . Hydrocodone     unknown  . Pantoprazole Sodium     unknown  . Voltaren [Diclofenac Sodium]     Outpatient Encounter Medications as of 02/23/2017  Medication Sig  . acetaminophen (TYLENOL) 325 MG tablet Take 650 mg by mouth every 6 (six) hours as needed for moderate pain.   Marland Kitchen acetaminophen (TYLENOL) 500 MG tablet Take 1,000 mg by mouth at bedtime.   Marland Kitchen acetaminophen (TYLENOL) 500 MG tablet Take 500 mg by mouth daily.  . Fluticasone-Salmeterol (ADVAIR) 100-50 MCG/DOSE AEPB Inhale 1 puff into the lungs 2 (two) times daily.  . furosemide (LASIX) 40 MG tablet Take 40 mg by mouth 2 (two) times daily. Hold for SBP <110  . guaiFENesin (MUCINEX) 600 MG 12 hr tablet Take 600 mg by mouth 2 (two) times daily.  Marland Kitchen guaifenesin (ROBITUSSIN) 100 MG/5ML syrup Take by mouth every 6 (six) hours as needed for cough (Give 10 mL).  . insulin glargine (LANTUS) 100 UNIT/ML injection Inject 28 Units into the skin at bedtime.   . insulin lispro (HUMALOG) 100 UNIT/ML cartridge Inject 12 Units into the skin. At 1230 PM and 530 PM. (If blood sugar is 150-250 = 8 units, if blood sugar is greater than 250 = 12 units daily)  . ipratropium-albuterol (DUONEB) 0.5-2.5 (3) MG/3ML SOLN Take 3 mLs by nebulization every 8 (eight) hours as needed. In addition to the scheduled dose  . ipratropium-albuterol (DUONEB) 0.5-2.5 (3) MG/3ML SOLN Take 3 mLs by nebulization 2 (two) times daily.  Marland Kitchen loperamide (IMODIUM A-D) 2 MG tablet Take 2 mg by mouth as needed for diarrhea or loose stools. Give 4 mg as the initial  dose. Then give 2 mg after each loose stool  . loratadine (CLARITIN) 10 MG tablet Take 10 mg by mouth daily as needed for allergies.  Marland Kitchen losartan (COZAAR) 100 MG tablet Take 100 mg daily by mouth.  . metoprolol tartrate (LOPRESSOR) 25 MG tablet Take 37.5 mg by mouth 2 (two) times daily. Hold for SBP <110  . mineral oil-hydrophilic petrolatum (AQUAPHOR) ointment Apply 1 application daily as needed topically for dry skin (to the left upper arm).  . Multiple Vitamins-Minerals (MULTIVITAMIN WITH MINERALS) tablet Take 1 tablet daily by mouth.  Marland Kitchen omeprazole (PRILOSEC) 20 MG capsule Take 20 mg by mouth daily.   . OXYGEN Inhale 1.5 L/min into the lungs.   . predniSONE (DELTASONE) 5 MG tablet Take 5 mg by  mouth daily with breakfast.  . spironolactone (ALDACTONE) 25 MG tablet Take 12.5 mg by mouth daily.   . traZODone (DESYREL) 50 MG tablet Take 50 mg by mouth at bedtime.  . trolamine salicylate (ASPERCREME) 10 % cream Apply 1 application topically 3 (three) times daily. Both knees  . [DISCONTINUED] rOPINIRole (REQUIP) 0.25 MG tablet Take 1 tablet (0.25 mg total) by mouth at bedtime. (Patient not taking: Reported on 02/20/2017)  . [DISCONTINUED] traZODone (DESYREL) 50 MG tablet Take 0.5 tablets (25 mg total) by mouth at bedtime as needed for sleep.   No facility-administered encounter medications on file as of 02/23/2017.     Review of Systems  Constitutional: Negative for appetite change, chills, diaphoresis, fatigue and fever.  HENT: Positive for hearing loss. Negative for congestion, mouth sores and rhinorrhea.   Eyes: Positive for visual disturbance.  Respiratory: Positive for cough and shortness of breath. Negative for choking and wheezing.   Cardiovascular: Positive for leg swelling. Negative for chest pain and palpitations.       Wears her ted hose  Gastrointestinal: Negative for abdominal pain, constipation, diarrhea, nausea and vomiting.  Genitourinary: Positive for frequency. Negative for  dysuria and flank pain.       Chronic urinary frequency  Musculoskeletal: Positive for arthralgias and gait problem. Negative for back pain.  Neurological: Negative for dizziness and headaches.  Psychiatric/Behavioral: Positive for sleep disturbance. Negative for behavioral problems and confusion.       Tolerating increased dosing of trazodone well    Immunization History  Administered Date(s) Administered  . Influenza Split 11/06/2011, 11/04/2012  . Influenza,inj,Quad PF,6+ Mos 11/17/2013  . Influenza-Unspecified 11/09/2014, 11/16/2015, 11/25/2015, 11/13/2016  . PPD Test 06/19/2010, 11/29/2014  . Pneumococcal Conjugate-13 08/11/2014  . Pneumococcal Polysaccharide-23 02/04/2003  . Pneumococcal-Unspecified 06/07/2014  . Tdap 02/27/2013  . Zoster 02/03/2006   Pertinent  Health Maintenance Due  Topic Date Due  . FOOT EXAM  02/07/2017  . DEXA SCAN  07/18/2026 (Originally 10/31/1984)  . HEMOGLOBIN A1C  06/07/2017  . OPHTHALMOLOGY EXAM  07/22/2017  . INFLUENZA VACCINE  Completed  . PNA vac Low Risk Adult  Completed   Fall Risk  02/02/2017 12/06/2014 07/11/2014 05/22/2014 11/07/2013  Falls in the past year? - Yes No Yes No  Number falls in past yr: - 1 - 1 -  Injury with Fall? - No - No -  Risk for fall due to : History of fall(s);Impaired mobility History of fall(s);Impaired mobility - History of fall(s);Impaired balance/gait -  Follow up - - - Falls evaluation completed -   Functional Status Survey:    Vitals:   02/23/17 1125  BP: (!) 143/67  Pulse: 67  Resp: 20  Temp: 98.4 F (36.9 C)  TempSrc: Oral  SpO2: 96%  Weight: 212 lb 14.4 oz (96.6 kg)  Height: 5\' 7"  (1.702 m)   Body mass index is 33.34 kg/m.   Wt Readings from Last 3 Encounters:  02/23/17 212 lb 14.4 oz (96.6 kg)  02/20/17 212 lb 14.4 oz (96.6 kg)  02/10/17 212 lb 14.4 oz (96.6 kg)   Physical Exam  Constitutional: She is oriented to person, place, and time.  Obese, elderly female in no acute distress    HENT:  Head: Normocephalic and atraumatic.  Mouth/Throat: Oropharynx is clear and moist.  Eyes: Conjunctivae and EOM are normal. Pupils are equal, round, and reactive to light. Right eye exhibits no discharge. Left eye exhibits no discharge.  Neck: Normal range of motion. Neck supple.  Cardiovascular: Normal  rate and regular rhythm.  Pulmonary/Chest: Effort normal. No respiratory distress. She has wheezes. She has no rales.  Poor air movement, on continuous oxygen by nasal canula  Abdominal: Soft. Bowel sounds are normal. There is no tenderness.  Musculoskeletal: She exhibits edema and deformity.  Limited ROM to left shoulder. Able to move all extremities, on wheelchair, unsteady gait and arthritis changes to fingers, cervical region paraspinal tenderness on palpation   Lymphadenopathy:    She has no cervical adenopathy.  Neurological: She is alert and oriented to person, place, and time.  Skin: Skin is warm and dry. No rash noted. She is not diaphoretic.  Psychiatric: She has a normal mood and affect. Her behavior is normal.    Labs reviewed: Recent Labs    08/18/16 12/08/16 01/08/17  NA 139 136*  136 137  K 3.9 4.3  4.3 3.8  BUN 35* 40*  40* 46*  CREATININE 1.1 1.1  1.06 1.03  CALCIUM  --  9.0 9.0   Recent Labs    03/13/16 07/10/16 08/18/16  AST 16 14 20   ALT 11 12 17   ALKPHOS 80 86 80   Recent Labs    03/13/16 07/10/16 12/08/16  WBC 7.0 8.1 9.1  9.1  HGB 10.3* 9.8* 11.4*  11.4  HCT 32* 30* 34*  33.8  PLT 184 168 235   Lab Results  Component Value Date   TSH 2.61 07/10/2016   Lab Results  Component Value Date   HGBA1C 7.2 12/08/2016   Lab Results  Component Value Date   CHOL 117 12/15/2016   HDL 52 12/15/2016   LDLCALC 40 12/15/2016   TRIG 186 (A) 12/15/2016    Significant Diagnostic Results in last 30 days:  No results found.  Assessment/Plan  Cough With wheezing. Currently on mucinex 600 mg bid with 10 ml every 6 hours as needed. Change  mucinex to 1200 mg bid x 5 days, then 600 mg bid. Change duoneb scheduled dosing as below.   Chronic respiratory failure Continue o2 by nasal canula and chronic prednisone 5 mg daily. Change duoneb to tid scheduled for 5 days then bid scheduled dosing. Monitor for signs of exacerbation.   Insomnia Continue trazodone 50 mg daily to assist with sleeping  Chronic combined CHF Controlled BP on chart review. Weight stable. Breathing stable. Stable leg edema. Continue lopressor 37.5 mg bid, losartan 100 mg daily, spironolactone 12.5 mg daily with furosemide 40 mg bid. No hypotensive episode noted.    Family/ staff Communication: reviewed care plan with patient and charge nurse.    Labs/tests ordered:  none   Blanchie Serve, MD Internal Medicine [facility] [address] [place], [place] [address] Cell Phone (Monday-Friday 8 am - 5 pm): [phone] On Call: [phone] and follow prompts after 5 pm and on weekends Office Phone: [phone] Office Fax: [phone]

## 2017-03-24 ENCOUNTER — Non-Acute Institutional Stay (SKILLED_NURSING_FACILITY): Payer: Medicare Other | Admitting: Family

## 2017-03-24 ENCOUNTER — Encounter: Payer: Self-pay | Admitting: Family

## 2017-03-24 DIAGNOSIS — I5042 Chronic combined systolic (congestive) and diastolic (congestive) heart failure: Secondary | ICD-10-CM

## 2017-03-24 DIAGNOSIS — J9611 Chronic respiratory failure with hypoxia: Secondary | ICD-10-CM | POA: Diagnosis not present

## 2017-03-24 DIAGNOSIS — G4700 Insomnia, unspecified: Secondary | ICD-10-CM | POA: Diagnosis not present

## 2017-03-24 DIAGNOSIS — H6123 Impacted cerumen, bilateral: Secondary | ICD-10-CM | POA: Diagnosis not present

## 2017-03-24 DIAGNOSIS — Z794 Long term (current) use of insulin: Secondary | ICD-10-CM

## 2017-03-24 DIAGNOSIS — N183 Chronic kidney disease, stage 3 (moderate): Secondary | ICD-10-CM

## 2017-03-24 DIAGNOSIS — E1122 Type 2 diabetes mellitus with diabetic chronic kidney disease: Secondary | ICD-10-CM | POA: Diagnosis not present

## 2017-03-24 NOTE — Progress Notes (Signed)
Location:  [facility] Room Number: 16 Place of Service:  SNF [phone]) Provider: Sherise Geerdes FNP-C   Blanchie Serve, MD  Patient Care Team: Blanchie Serve, MD as PCP - General (Internal Medicine) Clent Jacks, MD (Ophthalmology) Adrian Prows, MD as Attending Physician (Cardiology) S[place]ter, [facility] [name], MD as Consulting Physician (Orthopedic Surgery) Clance, Armando Reichert, MD as Consulting Physician (Pulmonary Disease) Lyndie Vanderloop, Nelda Bucks, NP as Nurse Practitioner [facility])  Extended Emergency Contact Information Primary Emergency Contact: Bugarin,[name] Address: [address]          [place], [place] [address] [name] of [place] Phone: [phone] Mobile Phone: [phone] Relation: Son Secondary Emergency Contact: [name] [place] of [place] Mobile Phone: [phone] Relation: Daughter   Code Status:  DNR Goals of care: Advanced Directive information Advanced Directives 03/24/2017  Does Patient Have a Medical Advance Directive? Yes  Type of Paramedic of [place];Out of facility DNR (pink MOST or yellow form);Living will  Does patient want to make changes to medical advance directive? -  Copy of [place] in Chart? Yes  Pre-existing out of facility DNR order (yellow form or pink MOST form) Yellow form placed in chart (order not valid for inpatient use)     Chief Complaint  Patient presents with  . Medical Management of Chronic Issues    monthly routine visit, **needs MOST form**    HPI:  Pt is a 84 y.o. female seen today [place] for medical management of chronic diseases. She has a medical history of HTN,CHF,CKD,type 2 DM,chronic respiratory failure with hypoxia,OA among other conditions.she is seen in her room today.she states continues to have knee pain but Aspercreme effective.she has upcoming appointment with ENT for cerumen removal.she has had no recent fall  episodes or weight loss.she continue to sleep on her recliner at bedtime and spends most time on her wheelchair during the visit. She requires assistance with ADL's.she needs MOST form completed. Will notify Social worker to arrange for advance care planning meeting with patient's POA.    Past Medical History:  Diagnosis Date  . Abnormality of gait   . Anemia, unspecified   . Arthritis   . Bronchiectasis without acute exacerbation ([place]) 04/10/2011   CT chest 2011   . Cataract 1990   Dr. Katy Fitch  . Cholelithiases   . Closed fracture of unspecified part of ramus of mandible   . COPD (chronic obstructive pulmonary disease) ([place])   . Coronary atherosclerosis of unspecified type of vessel, native or graft   . Disturbance of skin sensation   . DOE (dyspnea on exertion) 04/10/2011   PFTs 2013:  FEV1 1.04 (78%), ratio 64, couldn't do maneuver for lung volumes or dlco   . Edema   . Esophageal reflux   . Fall from other slipping, tripping, or stumbling   . Gastric ulcer with hemorrhage 2008  . Hypertension   . Hypoxemia   . Insomnia, unspecified   . Mucopurulent chronic bronchitis ([place])   . Nodule of neck 11/07/2013   Left neck posteriorly   . Nontoxic uninodular goiter   . Osteoarthrosis, unspecified whether generalized or localized, unspecified site   . Other and unspecified hyperlipidemia   . Other malaise and fatigue   . Pain in joint, lower leg 2010   right knee  . Pain in joint, shoulder region 2013   left  . Palpitations   . Pneumonia, organism unspecified(486)   . Regional enteritis  of unspecified site   . Rosacea   . Sciatica   . Squamous cell carcinoma of skin of lower limb, including hip 07/27/2012   Nonhealing lesion of the left mid calf medially   . Tachycardia 12/06/2014  . Tension headache   . Tension headache   . Type II or unspecified type diabetes mellitus without mention of complication, not stated as uncontrolled   . Unspecified venous (peripheral) insufficiency   .  Urine, incontinence, stress female 07/19/2013   Past Surgical History:  Procedure Laterality Date  . EYE SURGERY Bilateral 1990   Dr. Katy Fitch  . GASTROJEJUNOSTOMY  05/2004   secondary to ulcer  . JOINT REPLACEMENT Bilateral 1993-1998   total knee replacement  . MOLE REMOVAL  05/11/14   left hand and left side of face [facility]  . OTHER SURGICAL HISTORY  2008   Ulcer surgery   . SMALL INTESTINE SURGERY    . TOTAL KNEE ARTHROPLASTY Bilateral 1993, 1998   knees    Allergies  Allergen Reactions  . Adhesive [Tape]     unknown  . Aspirin     unknown  . Augmentin [Amoxicillin-Pot Clavulanate]   . Codeine     unknown  . Diclofenac   . Enalapril Cough  . Hydrocodone     unknown  . Pantoprazole Sodium     unknown  . Voltaren [Diclofenac Sodium]     Allergies as of 03/24/2017      Reactions   Adhesive [tape]    unknown   Aspirin    unknown   Augmentin [amoxicillin-pot Clavulanate]    Codeine    unknown   Diclofenac    Enalapril Cough   Hydrocodone    unknown   Pantoprazole Sodium    unknown   Voltaren [diclofenac Sodium]       Medication List        Accurate as of 03/24/17 10:44 PM. Always use your most recent med list.          acetaminophen 500 MG tablet Commonly known as:  TYLENOL Take 1,000 mg by mouth at bedtime.   acetaminophen 325 MG tablet Commonly known as:  TYLENOL Take 650 mg by mouth every 6 (six) hours as needed for moderate pain.   acetaminophen 500 MG tablet Commonly known as:  TYLENOL Take 500 mg by mouth daily.   Fluticasone-Salmeterol 100-50 MCG/DOSE Aepb Commonly known as:  ADVAIR Inhale 1 puff into the lungs 2 (two) times daily.   furosemide 40 MG tablet Commonly known as:  LASIX Take 40 mg by mouth 2 (two) times daily. Hold for SBP <110   guaiFENesin 600 MG 12 hr tablet Commonly known as:  MUCINEX Take 600 mg by mouth 2 (two) times daily.   guaifenesin 100 MG/5ML syrup Commonly known as:  ROBITUSSIN Take by mouth  every 6 (six) hours as needed for cough (Give 10 mL).   HUMALOG 100 UNIT/ML cartridge Generic drug:  insulin lispro Inject 12 Units into the skin. At 1230 PM and 530 PM. (If blood sugar is 150-250 = 8 units, if blood sugar is greater than 250 = 12 units daily)   IMODIUM A-D 2 MG tablet Generic drug:  loperamide Take 2 mg by mouth as needed for diarrhea or loose stools. Give 4 mg as the initial dose. Then give 2 mg after each loose stool   insulin glargine 100 UNIT/ML injection Commonly known as:  LANTUS Inject 28 Units into the skin at bedtime.   ipratropium-albuterol  0.5-2.5 (3) MG/3ML Soln Commonly known as:  DUONEB Take 3 mLs by nebulization 2 (two) times daily.   loratadine 10 MG tablet Commonly known as:  CLARITIN Take 10 mg by mouth daily as needed for allergies.   losartan 100 MG tablet Commonly known as:  COZAAR Take 100 mg daily by mouth.   metoprolol tartrate 25 MG tablet Commonly known as:  LOPRESSOR Take 37.5 mg by mouth 2 (two) times daily. Hold for SBP <110   mineral oil-hydrophilic petrolatum ointment Apply 1 application daily as needed topically for dry skin (to the left upper arm).   multivitamin with minerals tablet Take 1 tablet daily by mouth.   omeprazole 20 MG capsule Commonly known as:  PRILOSEC Take 20 mg by mouth daily.   OXYGEN Inhale 1.5 L/min into the lungs.   predniSONE 5 MG tablet Commonly known as:  DELTASONE Take 5 mg by mouth daily with breakfast.   spironolactone 25 MG tablet Commonly known as:  ALDACTONE Take 12.5 mg by mouth daily.   traZODone 50 MG tablet Commonly known as:  DESYREL Take 50 mg by mouth at bedtime.   trolamine salicylate 10 % cream Commonly known as:  ASPERCREME Apply 1 application topically 3 (three) times daily. Both knees       Review of Systems  Constitutional: Negative for appetite change, chills, fatigue and fever.  HENT: Positive for hearing loss. Negative for congestion, rhinorrhea, sinus  pressure, sinus pain, sneezing and sore throat.   Eyes: Positive for visual disturbance. Negative for pain, discharge, redness and itching.       Wears eye glasses  Respiratory: Negative for chest tightness, shortness of breath and wheezing.        Chronic cough  Cardiovascular: Negative for chest pain, palpitations and leg swelling.  Gastrointestinal: Negative for abdominal pain, constipation, diarrhea, nausea and vomiting.  Endocrine: Negative for cold intolerance, heat intolerance, polydipsia, polyphagia and polyuria.  Genitourinary: Negative for dysuria, flank pain, frequency and urgency.  Musculoskeletal: Positive for arthralgias and gait problem.  Skin: Negative for color change, pallor, rash and wound.  Neurological: Negative for dizziness, light-headedness and headaches.  Hematological: Does not bruise/bleed easily.  Psychiatric/Behavioral: Negative for agitation, confusion and sleep disturbance. The patient is not nervous/anxious.     Immunization History  Administered Date(s) Administered  . Influenza Split 11/06/2011, 11/04/2012  . Influenza,inj,Quad PF,6+ Mos 11/17/2013  . Influenza-Unspecified 11/09/2014, 11/16/2015, 11/25/2015, 11/13/2016  . PPD Test 06/19/2010, 11/29/2014  . Pneumococcal Conjugate-13 08/11/2014  . Pneumococcal Polysaccharide-23 02/04/2003  . Pneumococcal-Unspecified 06/07/2014  . Tdap 02/27/2013  . Zoster 02/03/2006   Pertinent  Health Maintenance Due  Topic Date Due  . FOOT EXAM  02/07/2017  . DEXA SCAN  07/18/2026 (Originally 10/31/1984)  . HEMOGLOBIN A1C  06/07/2017  . OPHTHALMOLOGY EXAM  07/22/2017  . INFLUENZA VACCINE  Completed  . PNA vac Low Risk Adult  Completed   Fall Risk  02/02/2017 12/06/2014 07/11/2014 05/22/2014 11/07/2013  Falls in the past year? - Yes No Yes No  Number falls in past yr: - 1 - 1 -  Injury with Fall? - No - No -  Risk for fall due to : History of fall(s);Impaired mobility History of fall(s);Impaired mobility - History  of fall(s);Impaired balance/gait -  Follow up - - - Falls evaluation completed -    Vitals:   03/24/17 1103  BP: (!) 149/75  Pulse: 66  Resp: 15  Temp: 98.3 F (36.8 C)  SpO2: 96%  Weight: 214 lb (97.1  kg)  Height: 5\' 7"  (1.702 m)   Body mass index is 33.52 kg/m. Physical Exam  Constitutional: She is oriented to person, place, and time. She appears well-developed and well-nourished.  Elderly in no acute distress   HENT:  Head: Normocephalic.  Right Ear: No drainage or tenderness. Decreased hearing is noted.  Left Ear: No drainage or tenderness. Decreased hearing is noted.  HOH wears hearing aid on left ear.bilateral ear cerumen impactions.  Eyes: Conjunctivae and EOM are normal. Pupils are equal, round, and reactive to light. Right eye exhibits no discharge. Left eye exhibits no discharge. No scleral icterus.  Neck: Normal range of motion. No JVD present. No thyromegaly present.  Cardiovascular: Normal rate, regular rhythm, normal heart sounds and intact distal pulses. Exam reveals no gallop and no friction rub.  No murmur heard. Pulmonary/Chest: Effort normal and breath sounds normal. No respiratory distress. She has no wheezes. She has no rales.  Oxygen via nasal cannula in place  Abdominal: Soft. Bowel sounds are normal. She exhibits no distension. There is no tenderness. There is no rebound and no guarding.  Musculoskeletal:  Moves x 4 extremities except left shoulder ROM limited due to pain.Arthritic changes to fingers.Unsteady gait uses wheelchair and scooter for long distances. Knee high ted hose in place.   Lymphadenopathy:    She has no cervical adenopathy.  Neurological: She is oriented to person, place, and time. Coordination normal.  Skin: Skin is warm and dry. No rash noted. No erythema. No pallor.  Psychiatric: She has a normal mood and affect. Her behavior is normal.   Labs reviewed: Recent Labs    08/18/16 12/08/16 01/08/17  NA 139 136*  136 137  K 3.9 4.3   4.3 3.8  BUN 35* 40*  40* 46*  CREATININE 1.1 1.1  1.06 1.03  CALCIUM  --  9.0 9.0   Recent Labs    07/10/16 08/18/16  AST 14 20  ALT 12 17  ALKPHOS 86 80   Recent Labs    07/10/16 12/08/16  WBC 8.1 9.1  9.1  HGB 9.8* 11.4*  11.4  HCT 30* 34*  33.8  PLT 168 235   Lab Results  Component Value Date   TSH 2.61 07/10/2016   Lab Results  Component Value Date   HGBA1C 7.2 12/08/2016   Lab Results  Component Value Date   CHOL 117 12/15/2016   HDL 52 12/15/2016   LDLCALC 40 12/15/2016   TRIG 186 (A) 12/15/2016    Significant Diagnostic Results in last 30 days:  No results found.  Assessment/Plan 1. Controlled type 2 diabetes mellitus with stage 3 chronic kidney disease, CBG log reviewed readings in the 80's-200's with occasional 300's possible due to snacks. Lab Results  Component Value Date   HGBA1C 7.2 12/08/2016  Continue on Lantus 28 units daily at bedtime and Humalog 12 units at 12: 30 pm and 5: 30 PM.On ARB.continue to monitor hgb A1C 2. Insomnia Trazodone 50 mg tablet at bedtime effective.continue to monitor.  3. Chronic respiratory failure with hypoxia  Breathing stable.Negative exam findings.continue on Duoneb twice daily,advair and Mucinex.continue oxygen via nasal cannula.   4. Chronic combined systolic and diastolic congestive heart failure  No recent abrupt weight changes.Exam findings negative for wheezing,cough,rales.continue on furosemide,metoprolol,losartan and aldactone.continue to monitor weight.   5. Bilateral impacted cerumen Has upcoming appointment with ENT. TM not visualized during visit. No tenderness to ear to touch.   Family/ staff Communication: Reviewed plan of care with patient and  facility Nurse  Labs/tests ordered: None   Sandrea Hughs, NP

## 2017-04-20 ENCOUNTER — Non-Acute Institutional Stay (SKILLED_NURSING_FACILITY): Payer: Medicare Other | Admitting: Internal Medicine

## 2017-04-20 ENCOUNTER — Encounter: Payer: Self-pay | Admitting: Internal Medicine

## 2017-04-20 DIAGNOSIS — G4709 Other insomnia: Secondary | ICD-10-CM | POA: Diagnosis not present

## 2017-04-20 DIAGNOSIS — E871 Hypo-osmolality and hyponatremia: Secondary | ICD-10-CM

## 2017-04-20 DIAGNOSIS — J449 Chronic obstructive pulmonary disease, unspecified: Secondary | ICD-10-CM

## 2017-04-20 DIAGNOSIS — N183 Chronic kidney disease, stage 3 unspecified: Secondary | ICD-10-CM

## 2017-04-20 DIAGNOSIS — I5042 Chronic combined systolic (congestive) and diastolic (congestive) heart failure: Secondary | ICD-10-CM

## 2017-04-20 DIAGNOSIS — Z794 Long term (current) use of insulin: Secondary | ICD-10-CM

## 2017-04-20 DIAGNOSIS — R2681 Unsteadiness on feet: Secondary | ICD-10-CM

## 2017-04-20 DIAGNOSIS — K219 Gastro-esophageal reflux disease without esophagitis: Secondary | ICD-10-CM

## 2017-04-20 DIAGNOSIS — J9611 Chronic respiratory failure with hypoxia: Secondary | ICD-10-CM

## 2017-04-20 DIAGNOSIS — I130 Hypertensive heart and chronic kidney disease with heart failure and stage 1 through stage 4 chronic kidney disease, or unspecified chronic kidney disease: Secondary | ICD-10-CM

## 2017-04-20 DIAGNOSIS — E1122 Type 2 diabetes mellitus with diabetic chronic kidney disease: Secondary | ICD-10-CM

## 2017-04-20 LAB — BASIC METABOLIC PANEL
CALCIUM: 9
Carbon Dioxide, Total: 30
Chloride: 100
EGFR (Non-African Amer.): 46

## 2017-04-20 NOTE — Progress Notes (Signed)
Location:  [place] Room Number: 16 Place of Service:  SNF [phone]) Provider:  Blanchie Serve MD  Blanchie Serve, MD  Patient Care Team: Blanchie Serve, MD as PCP - General (Internal Medicine) Clent Jacks, MD (Ophthalmology) Adrian Prows, MD as Attending Physician (Cardiology) Ehrenfeld, [facility] [name], MD as Consulting Physician (Orthopedic Surgery) Clance, Armando Reichert, MD as Consulting Physician (Pulmonary Disease) Ngetich, Nelda Bucks, NP as Nurse Practitioner [facility])  Extended Emergency Contact Information Primary Emergency Contact: Konecny,[name] Address: [address]          [place], [place] [address] [name] of [place] Phone: [phone] Mobile Phone: [phone] Relation: Son Secondary Emergency Contact: [name] [place] of [place] Mobile Phone: [phone] Relation: Daughter  Code Status:  DNR  Goals of care: Advanced Directive information Advanced Directives 04/20/2017  Does Patient Have a Medical Advance Directive? Yes  Type of Paramedic of [place];Living will;Out of facility DNR (pink MOST or yellow form)  Does patient want to make changes to medical advance directive? No - Patient declined  Copy of [place] in Chart? Yes  Pre-existing out of facility DNR order (yellow form or pink MOST form) Yellow form placed in chart (order not valid for inpatient use)     Chief Complaint  Patient presents with  . Medical Management of Chronic Issues    Routine Visit     HPI:  Pt is a 84 y.o. female seen today for medical management of chronic diseases.  Chronic respiratory failure- breathing stable on 1.5 liter oxygen by nasal canula. Has chronic cough, denies increased cough or increased sputum production.   Insomnia- sleeping fair overall, takes trazodone 50 mg daily at present.   COPD- on prednisone 5 mg daily, advair bid, duoneb bid with prn for dyspnea, oxygen by  nasal canula, cough syrup  Unsteady gait- uses motorized wheelchair for outside her room and walker inside her room. No fall reported.   Hypertension- BP readings reviewed. Few SBP readings in 150s and 160s. Currently on metoprolol tartrate 37.5 mg bid, losartan 100 mg daily, spironolactone 12.5 mg daily and lasix 40 mg twice daily.   CHF- stable, meds as above.   gerd- occasional reflux symptom but overall controlled, takes omeprazole 20 mg daily  Type 2 DM- cbg readings reviewed 75-278. Currently on lantus 28 u daily with humalog premeal. Also on losartan  Past Medical History:  Diagnosis Date  . Abnormality of gait   . Anemia, unspecified   . Arthritis   . Bronchiectasis without acute exacerbation ([place]) 04/10/2011   CT chest 2011   . Cataract 1990   Dr. Katy Fitch  . Cholelithiases   . Closed fracture of unspecified part of ramus of mandible   . COPD (chronic obstructive pulmonary disease) ([place])   . Coronary atherosclerosis of unspecified type of vessel, native or graft   . Disturbance of skin sensation   . DOE (dyspnea on exertion) 04/10/2011   PFTs 2013:  FEV1 1.04 (78%), ratio 64, couldn't do maneuver for lung volumes or dlco   . Edema   . Esophageal reflux   . Fall from other slipping, tripping, or stumbling   . Gastric ulcer with hemorrhage 2008  . Hypertension   . Hypoxemia   . Insomnia, unspecified   . Mucopurulent chronic bronchitis ([place])   . Nodule of neck 11/07/2013   Left neck posteriorly   . Nontoxic uninodular goiter   . Osteoarthrosis, unspecified whether  generalized or localized, unspecified site   . Other and unspecified hyperlipidemia   . Other malaise and fatigue   . Pain in joint, lower leg 2010   right knee  . Pain in joint, shoulder region 2013   left  . Palpitations   . Pneumonia, organism unspecified(486)   . Regional enteritis of unspecified site   . Rosacea   . Sciatica   . Squamous cell carcinoma of skin of lower limb, including hip 07/27/2012    Nonhealing lesion of the left mid calf medially   . Tachycardia 12/06/2014  . Tension headache   . Tension headache   . Type II or unspecified type diabetes mellitus without mention of complication, not stated as uncontrolled   . Unspecified venous (peripheral) insufficiency   . Urine, incontinence, stress female 07/19/2013   Past Surgical History:  Procedure Laterality Date  . EYE SURGERY Bilateral 1990   Dr. Katy Fitch  . GASTROJEJUNOSTOMY  05/2004   secondary to ulcer  . JOINT REPLACEMENT Bilateral 1993-1998   total knee replacement  . MOLE REMOVAL  05/11/14   left hand and left side of face [facility]  . OTHER SURGICAL HISTORY  2008   Ulcer surgery   . SMALL INTESTINE SURGERY    . TOTAL KNEE ARTHROPLASTY Bilateral 1993, 1998   knees    Allergies  Allergen Reactions  . Adhesive [Tape]     unknown  . Aspirin     unknown  . Augmentin [Amoxicillin-Pot Clavulanate]   . Codeine     unknown  . Diclofenac   . Enalapril Cough  . Hydrocodone     unknown  . Pantoprazole Sodium     unknown  . Voltaren [Diclofenac Sodium]     Outpatient Encounter Medications as of 04/20/2017  Medication Sig  . acetaminophen (TYLENOL) 325 MG tablet Take 650 mg by mouth every 6 (six) hours as needed for moderate pain.   Marland Kitchen acetaminophen (TYLENOL) 500 MG tablet Take 1,000 mg by mouth at bedtime.   Marland Kitchen acetaminophen (TYLENOL) 500 MG tablet Take 500 mg by mouth daily.  . Fluticasone-Salmeterol (ADVAIR) 100-50 MCG/DOSE AEPB Inhale 1 puff into the lungs 2 (two) times daily.  . furosemide (LASIX) 40 MG tablet Take 40 mg by mouth 2 (two) times daily. Hold for SBP <110  . guaiFENesin (MUCINEX) 600 MG 12 hr tablet Take 600 mg by mouth 2 (two) times daily.  Marland Kitchen guaifenesin (ROBITUSSIN) 100 MG/5ML syrup Take by mouth every 6 (six) hours as needed for cough (Give 10 mL).  . insulin glargine (LANTUS) 100 UNIT/ML injection Inject 28 Units into the skin at bedtime.   . insulin lispro (HUMALOG) 100 UNIT/ML  cartridge Inject 12 Units into the skin. At 1230 PM and 530 PM. (If blood sugar is 150-250 = 8 units, if blood sugar is greater than 250 = 12 units daily)  . ipratropium-albuterol (DUONEB) 0.5-2.5 (3) MG/3ML SOLN Take 3 mLs by nebulization every 8 (eight) hours as needed.   Marland Kitchen ipratropium-albuterol (DUONEB) 0.5-2.5 (3) MG/3ML SOLN Take 3 mLs by nebulization 2 (two) times daily.  Marland Kitchen loperamide (IMODIUM A-D) 2 MG tablet Take 2 mg by mouth as needed for diarrhea or loose stools. Give 4 mg as the initial dose. Then give 2 mg after each loose stool  . loratadine (CLARITIN) 10 MG tablet Take 10 mg by mouth daily as needed for allergies.  Marland Kitchen losartan (COZAAR) 100 MG tablet Take 100 mg daily by mouth.  . metoprolol tartrate (LOPRESSOR) 25  MG tablet Take 37.5 mg by mouth 2 (two) times daily. Hold for SBP <110  . mineral oil-hydrophilic petrolatum (AQUAPHOR) ointment Apply 1 application daily as needed topically for dry skin (to the left upper arm).  . Multiple Vitamins-Minerals (MULTIVITAMIN WITH MINERALS) tablet Take 1 tablet daily by mouth.  Marland Kitchen omeprazole (PRILOSEC) 20 MG capsule Take 20 mg by mouth daily.   . OXYGEN Inhale 1.5 L/min into the lungs.   . predniSONE (DELTASONE) 5 MG tablet Take 5 mg by mouth daily with breakfast.  . spironolactone (ALDACTONE) 25 MG tablet Take 12.5 mg by mouth daily.   . traZODone (DESYREL) 50 MG tablet Take 50 mg by mouth at bedtime.  . trolamine salicylate (ASPERCREME) 10 % cream Apply 1 application topically 3 (three) times daily. Both knees   No facility-administered encounter medications on file as of 04/20/2017.     Review of Systems  Constitutional: Negative for appetite change, chills, diaphoresis, fatigue and fever.  HENT: Positive for hearing loss and tinnitus. Negative for congestion, ear discharge, ear pain, mouth sores, rhinorrhea, sinus pressure, sore throat and trouble swallowing.   Eyes: Positive for visual disturbance.  Respiratory: Positive for cough and  shortness of breath. Negative for choking and wheezing.   Cardiovascular: Positive for leg swelling. Negative for chest pain and palpitations.       Wears her ted hose  Gastrointestinal: Negative for abdominal pain, blood in stool, constipation, diarrhea, nausea and vomiting.  Genitourinary: Positive for frequency. Negative for dysuria, flank pain, hematuria and pelvic pain.       Chronic urinary frequency. Also on diuretic  Musculoskeletal: Positive for arthralgias and gait problem. Negative for back pain.       Uses walker and wheelchair for ambulation  Skin: Negative for pallor, rash and wound.  Neurological: Negative for dizziness, syncope, speech difficulty, numbness and headaches.  Psychiatric/Behavioral: Positive for sleep disturbance. Negative for behavioral problems and confusion.       Tolerating increased dosing of trazodone well    Immunization History  Administered Date(s) Administered  . Influenza Split 11/06/2011, 11/04/2012  . Influenza,inj,Quad PF,6+ Mos 11/17/2013  . Influenza-Unspecified 11/09/2014, 11/16/2015, 11/25/2015, 11/13/2016  . PPD Test 06/19/2010, 11/29/2014  . Pneumococcal Conjugate-13 08/11/2014  . Pneumococcal Polysaccharide-23 02/04/2003  . Pneumococcal-Unspecified 06/07/2014  . Tdap 02/27/2013  . Zoster 02/03/2006   Pertinent  Health Maintenance Due  Topic Date Due  . FOOT EXAM  02/07/2017  . DEXA SCAN  07/18/2026 (Originally 10/31/1984)  . HEMOGLOBIN A1C  06/07/2017  . OPHTHALMOLOGY EXAM  07/22/2017  . INFLUENZA VACCINE  Completed  . PNA vac Low Risk Adult  Completed   Fall Risk  02/02/2017 12/06/2014 07/11/2014 05/22/2014 11/07/2013  Falls in the past year? - Yes No Yes No  Number falls in past yr: - 1 - 1 -  Injury with Fall? - No - No -  Risk for fall due to : History of fall(s);Impaired mobility History of fall(s);Impaired mobility - History of fall(s);Impaired balance/gait -  Follow up - - - Falls evaluation completed -   Functional Status  Survey:    Vitals:   04/20/17 1205  BP: (!) 165/72  Pulse: 70  Resp: 18  Temp: (!) 96.8 F (36 C)  TempSrc: Oral  Weight: 212 lb 8 oz (96.4 kg)  Height: 5\' 7"  (1.702 m)   Body mass index is 33.28 kg/m.   Wt Readings from Last 3 Encounters:  04/20/17 212 lb 8 oz (96.4 kg)  03/24/17 214 lb (97.1 kg)  02/23/17 212 lb 14.4 oz (96.6 kg)   Physical Exam  Constitutional: She is oriented to person, place, and time.  Obese, elderly female in no acute distress  HENT:  Head: Normocephalic and atraumatic.  Nose: Nose normal.  Mouth/Throat: Oropharynx is clear and moist. No oropharyngeal exudate.  Eyes: Conjunctivae and EOM are normal. Pupils are equal, round, and reactive to light. Right eye exhibits no discharge. Left eye exhibits no discharge. No scleral icterus.  Neck: Normal range of motion. Neck supple.  Cardiovascular: Normal rate and regular rhythm.  Pulmonary/Chest: Effort normal. No respiratory distress. She has wheezes. She has no rales.  Poor air movement, on continuous oxygen by nasal canula  Abdominal: Soft. Bowel sounds are normal. There is no tenderness.  Musculoskeletal: She exhibits edema and deformity.  Limited ROM to left shoulder. Able to move all extremities, on wheelchair, unsteady gait and arthritis changes to fingers   Lymphadenopathy:    She has no cervical adenopathy.  Neurological: She is alert and oriented to person, place, and time.  Skin: Skin is warm and dry. No rash noted. She is not diaphoretic.  Psychiatric: She has a normal mood and affect. Her behavior is normal.    Labs reviewed: Recent Labs    08/18/16 12/08/16 01/08/17  NA 139 136*  136 137  137  K 3.9 4.3  4.3 3.8  3.8  CL  --   --  100  CO2  --   --  30  BUN 35* 40*  40* 46*  CREATININE 1.1 1.1  1.06 1.0  1.03  CALCIUM  --  9.0 9.0  9.0   Recent Labs    07/10/16 08/18/16  AST 14 20  ALT 12 17  ALKPHOS 86 80   Recent Labs    07/10/16 12/08/16  WBC 8.1 9.1  9.1  HGB  9.8* 11.4*  11.4  HCT 30* 34*  33.8  PLT 168 235   Lab Results  Component Value Date   TSH 2.61 07/10/2016   Lab Results  Component Value Date   HGBA1C 7.2 12/08/2016   Lab Results  Component Value Date   CHOL 117 12/15/2016   HDL 52 12/15/2016   LDLCALC 40 12/15/2016   TRIG 186 (A) 12/15/2016    Significant Diagnostic Results in last 30 days:  No results found.  Assessment/Plan  1. Chronic obstructive pulmonary disease, unspecified COPD type ([place]) Breathing stable, no signs of exacerbation. Continue oxygen by nasal canula, chronic prednisone, advair and duoneb.   2. Chronic combined systolic and diastolic congestive heart failure (HCC) Continue b blocker, ARB, lasix and spironolactone. Continue ted hose. Weight reviewed.   3. Hypertensive heart and kidney disease with chronic combined systolic and diastolic congestive heart failure and stage 3 chronic kidney disease (HCC) Continue metoprolol tartrate 37.5 mg bid, losartan 100 mg daily, spironolactone 12.5 mg daily and lasix 40 mg twice daily.   4. Chronic respiratory failure with hypoxia (HCC) Continue bronchodilators and oxygen for now.   5. Gastroesophageal reflux disease without esophagitis Continue omeprazole and monitor  6. Controlled type 2 diabetes mellitus with stage 3 chronic kidney disease, with long-term current use of insulin (HCC) Check a1c. Continue losartan and current insulin regimen.   7. Other insomnia Continue trazodone  8. Hyponatremia With her on furosemide, check BMP.   9.Unsteady gait Continue motorized wheelchair for outside her room and walker inside her room. Fall precautions and assist with ADLs.    Family/ staff Communication: reviewed care plan with  patient and charge nurse.    Labs/tests ordered:  Cbc, cmp, a1c   Blanchie Serve, MD Internal Medicine [facility] [address] [place], [place] [address] Cell Phone (Monday-Friday 8 am - 5  pm): [phone] On Call: [phone] and follow prompts after 5 pm and on weekends Office Phone: [phone] Office Fax: [phone]

## 2017-04-23 DIAGNOSIS — N183 Chronic kidney disease, stage 3 (moderate): Secondary | ICD-10-CM | POA: Diagnosis not present

## 2017-04-23 DIAGNOSIS — E871 Hypo-osmolality and hyponatremia: Secondary | ICD-10-CM | POA: Diagnosis not present

## 2017-04-23 DIAGNOSIS — E1122 Type 2 diabetes mellitus with diabetic chronic kidney disease: Secondary | ICD-10-CM | POA: Diagnosis not present

## 2017-04-24 LAB — BASIC METABOLIC PANEL
BUN: 47 — AB (ref 4–21)
Creatinine: 1.2 — AB (ref 0.5–1.1)
Glucose: 127
POTASSIUM: 4.7 (ref 3.4–5.3)
SODIUM: 140 (ref 137–147)

## 2017-04-24 LAB — CMP 10231
ALBUMIN: 3.2
CALCIUM: 9
Carbon Dioxide, Total: 36
Chloride: 100
GFR CALC NON AF AMER: 40
Globulin: 2.1
TOTAL PROTEIN: 5.3

## 2017-04-24 LAB — HEMOGLOBIN A1C: Hemoglobin A1C: 6.9

## 2017-04-24 LAB — CBC AND DIFFERENTIAL
HEMATOCRIT: 30 — AB (ref 36–46)
HEMOGLOBIN: 10 — AB (ref 12.0–16.0)
Platelets: 186 (ref 150–399)
WBC: 7.9

## 2017-04-24 LAB — HEPATIC FUNCTION PANEL
ALK PHOS: 106 (ref 25–125)
ALT: 11 (ref 7–35)
AST: 15 (ref 13–35)
Bilirubin, Total: 0.2

## 2017-05-18 ENCOUNTER — Non-Acute Institutional Stay (SKILLED_NURSING_FACILITY): Payer: Medicare Other | Admitting: Family

## 2017-05-18 ENCOUNTER — Encounter: Payer: Self-pay | Admitting: Family

## 2017-05-18 DIAGNOSIS — I130 Hypertensive heart and chronic kidney disease with heart failure and stage 1 through stage 4 chronic kidney disease, or unspecified chronic kidney disease: Secondary | ICD-10-CM | POA: Diagnosis not present

## 2017-05-18 DIAGNOSIS — N183 Chronic kidney disease, stage 3 unspecified: Secondary | ICD-10-CM

## 2017-05-18 DIAGNOSIS — Z794 Long term (current) use of insulin: Secondary | ICD-10-CM

## 2017-05-18 DIAGNOSIS — E1122 Type 2 diabetes mellitus with diabetic chronic kidney disease: Secondary | ICD-10-CM | POA: Diagnosis not present

## 2017-05-18 DIAGNOSIS — K219 Gastro-esophageal reflux disease without esophagitis: Secondary | ICD-10-CM

## 2017-05-18 DIAGNOSIS — I5042 Chronic combined systolic (congestive) and diastolic (congestive) heart failure: Secondary | ICD-10-CM | POA: Diagnosis not present

## 2017-05-18 DIAGNOSIS — M25512 Pain in left shoulder: Secondary | ICD-10-CM | POA: Diagnosis not present

## 2017-05-18 DIAGNOSIS — J449 Chronic obstructive pulmonary disease, unspecified: Secondary | ICD-10-CM | POA: Diagnosis not present

## 2017-05-18 NOTE — Progress Notes (Signed)
Location:  [facility] Room Number: 16 Place of Service:  SNF ([phone]) Provider: Hanifa Antonetti FNP-C   Blanchie Serve, MD  Patient Care Team: Blanchie Serve, MD as PCP - General (Internal Medicine) Clent Jacks, MD (Ophthalmology) Adrian Prows, MD as Attending Physician (Cardiology) [place] [name], MD as Consulting Physician (Orthopedic Surgery) Clance, Armando Reichert, MD as Consulting Physician (Pulmonary Disease) Lurlene Ronda, Nelda Bucks, NP as Nurse Practitioner [facility] Medicine)  Extended Emergency Contact Information Primary Emergency Contact: Igou,[name] Address: [address]          [place], [place] [address] [name] of [place] Phone: [phone] Mobile Phone: [phone] Relation: Son Secondary Emergency Contact: [name] [place] of [place] Mobile Phone: [phone] Relation: Daughter  Code Status: DNR Goals of care: Advanced Directive information Advanced Directives 04/20/2017  Does Patient Have a Medical Advance Directive? Yes  Type of Paramedic of [place];Living will;Out of facility DNR (pink MOST or yellow form)  Does patient want to make changes to medical advance directive? No - Patient declined  Copy of [place] in Chart? Yes  Pre-existing out of facility DNR order (yellow form or pink MOST form) Yellow form placed in chart (order not valid for inpatient use)     Chief Complaint  Patient presents with  . Medical Management of Chronic Issues    routine monthly visit    HPI:  Pt is a 84 y.o. female seen today [facility] for medical management of chronic diseases.she has a medical history of HTN,CHF,COPD,type 2 DM,CKD stage 3,GERD,OA among other conditions.she is seen in her room today.she denies any new acute issues.she denies any fall episode.No recent weight changes.Her Blood pressure reviewed her systolic readings ranging in the 120's-170's with few readings  in the 180's.she denies any headache,dizziness,nausea,vomiting or vision changes.Her CBG readings reviewed morning readings in the 80's-150's and afternoon readings ranging in the 90's-200's with few readings in the 300's.she states tries to eat bedtime snack but doesn't at times.She denies any signs or symptoms of hypo/hyperglycemia.Facility Nurse reports no new concerns.   Past Medical History:  Diagnosis Date  . Abnormality of gait   . Anemia, unspecified   . Arthritis   . Bronchiectasis without acute exacerbation ([place]) 04/10/2011   CT chest 2011   . Cataract 1990   Dr. Katy Fitch  . Cholelithiases   . Closed fracture of unspecified part of ramus of mandible   . COPD (chronic obstructive pulmonary disease) ([place])   . Coronary atherosclerosis of unspecified type of vessel, native or graft   . Disturbance of skin sensation   . DOE (dyspnea on exertion) 04/10/2011   PFTs 2013:  FEV1 1.04 (78%), ratio 64, couldn't do maneuver for lung volumes or dlco   . Edema   . Esophageal reflux   . Fall from other slipping, tripping, or stumbling   . Gastric ulcer with hemorrhage 2008  . Hypertension   . Hypoxemia   . Insomnia, unspecified   . Mucopurulent chronic bronchitis ([place])   . Nodule of neck 11/07/2013   Left neck posteriorly   . Nontoxic uninodular goiter   . Osteoarthrosis, unspecified whether generalized or localized, unspecified site   . Other and unspecified hyperlipidemia   . Other malaise and fatigue   . Pain in joint, lower leg 2010   right knee  . Pain in joint, shoulder region 2013   left  . Palpitations   . Pneumonia, organism unspecified(486)   .  Regional enteritis of unspecified site   . Rosacea   . Sciatica   . Squamous cell carcinoma of skin of lower limb, including hip 07/27/2012   Nonhealing lesion of the left mid calf medially   . Tachycardia 12/06/2014  . Tension headache   . Tension headache   . Type II or unspecified type diabetes mellitus without mention of  complication, not stated as uncontrolled   . Unspecified venous (peripheral) insufficiency   . Urine, incontinence, stress female 07/19/2013   Past Surgical History:  Procedure Laterality Date  . EYE SURGERY Bilateral 1990   Dr. Katy Fitch  . GASTROJEJUNOSTOMY  05/2004   secondary to ulcer  . JOINT REPLACEMENT Bilateral 1993-1998   total knee replacement  . MOLE REMOVAL  05/11/14   left hand and left side of face [facility]  . OTHER SURGICAL HISTORY  2008   Ulcer surgery   . SMALL INTESTINE SURGERY    . TOTAL KNEE ARTHROPLASTY Bilateral 1993, 1998   knees    Allergies  Allergen Reactions  . Adhesive [Tape]     unknown  . Aspirin     unknown  . Augmentin [Amoxicillin-Pot Clavulanate]   . Codeine     unknown  . Diclofenac   . Enalapril Cough  . Hydrocodone     unknown  . Pantoprazole Sodium     unknown  . Voltaren [Diclofenac Sodium]     Allergies as of 05/18/2017      Reactions   Adhesive [tape]    unknown   Aspirin    unknown   Augmentin [amoxicillin-pot Clavulanate]    Codeine    unknown   Diclofenac    Enalapril Cough   Hydrocodone    unknown   Pantoprazole Sodium    unknown   Voltaren [diclofenac Sodium]       Medication List        Accurate as of 05/18/17  1:37 PM. Always use your most recent med list.          acetaminophen 500 MG tablet Commonly known as:  TYLENOL Take 1,000 mg by mouth at bedtime.   acetaminophen 325 MG tablet Commonly known as:  TYLENOL Take 650 mg by mouth every 6 (six) hours as needed for moderate pain.   acetaminophen 500 MG tablet Commonly known as:  TYLENOL Take 500 mg by mouth daily.   Fluticasone-Salmeterol 100-50 MCG/DOSE Aepb Commonly known as:  ADVAIR Inhale 1 puff into the lungs 2 (two) times daily.   furosemide 40 MG tablet Commonly known as:  LASIX Take 40 mg by mouth 2 (two) times daily. Hold for SBP <110   guaiFENesin 600 MG 12 hr tablet Commonly known as:  MUCINEX Take 600 mg by mouth 2  (two) times daily.   guaifenesin 100 MG/5ML syrup Commonly known as:  ROBITUSSIN Take by mouth every 6 (six) hours as needed for cough (Give 10 mL).   HUMALOG 100 UNIT/ML cartridge Generic drug:  insulin lispro Inject 12 Units into the skin. At 1230 PM and 530 PM. (If blood sugar is 150-250 = 8 units, if blood sugar is greater than 250 = 12 units daily)   IMODIUM A-D 2 MG tablet Generic drug:  loperamide Take 2 mg by mouth as needed for diarrhea or loose stools. Give 4 mg as the initial dose. Then give 2 mg after each loose stool   insulin glargine 100 UNIT/ML injection Commonly known as:  LANTUS Inject 28 Units into the skin at bedtime.  ipratropium-albuterol 0.5-2.5 (3) MG/3ML Soln Commonly known as:  DUONEB Take 3 mLs by nebulization every 8 (eight) hours as needed.   ipratropium-albuterol 0.5-2.5 (3) MG/3ML Soln Commonly known as:  DUONEB Take 3 mLs by nebulization 2 (two) times daily.   loratadine 10 MG tablet Commonly known as:  CLARITIN Take 10 mg by mouth daily as needed for allergies.   losartan 100 MG tablet Commonly known as:  COZAAR Take 100 mg daily by mouth.   metoprolol tartrate 25 MG tablet Commonly known as:  LOPRESSOR Take 37.5 mg by mouth 2 (two) times daily. Hold for SBP <110   mineral oil-hydrophilic petrolatum ointment Apply 1 application daily as needed topically for dry skin (to the left upper arm).   multivitamin with minerals tablet Take 1 tablet daily by mouth.   omeprazole 20 MG capsule Commonly known as:  PRILOSEC Take 20 mg by mouth daily.   OXYGEN Inhale 1.5 L/min into the lungs.   predniSONE 5 MG tablet Commonly known as:  DELTASONE Take 5 mg by mouth daily with breakfast.   spironolactone 25 MG tablet Commonly known as:  ALDACTONE Take 12.5 mg by mouth daily.   traZODone 50 MG tablet Commonly known as:  DESYREL Take 50 mg by mouth at bedtime.   trolamine salicylate 10 % cream Commonly known as:  ASPERCREME Apply 1  application topically 3 (three) times daily. Both knees       Review of Systems  Constitutional: Negative for appetite change, chills, fatigue and fever.  HENT: Positive for hearing loss. Negative for congestion, rhinorrhea, sinus pressure, sinus pain, sneezing, sore throat and trouble swallowing.        Wears left ear hearing Aid.   Eyes: Positive for visual disturbance. Negative for discharge, redness and itching.       Wears eye glasses   Respiratory: Negative for chest tightness and shortness of breath.        Chronic cough and wheezing hx COPD but no increase of sputum  Cardiovascular: Positive for leg swelling. Negative for chest pain and palpitations.  Gastrointestinal: Negative for abdominal distention, abdominal pain, constipation, diarrhea, nausea and vomiting.       LBM 05/17/2017  Endocrine: Negative for cold intolerance, heat intolerance, polydipsia, polyphagia and polyuria.  Genitourinary: Negative for dysuria, flank pain, frequency and urgency.  Musculoskeletal: Positive for arthralgias and gait problem.       Left shoulder pain,neck Osteoarthritic pain under control with current regimen   Skin: Negative for color change, pallor, rash and wound.  Neurological: Negative for dizziness, light-headedness and headaches.  Hematological: Does not bruise/bleed easily.  Psychiatric/Behavioral: Negative for agitation, confusion and sleep disturbance. The patient is not nervous/anxious.     Immunization History  Administered Date(s) Administered  . Influenza Split 11/06/2011, 11/04/2012  . Influenza,inj,Quad PF,6+ Mos 11/17/2013  . Influenza-Unspecified 11/09/2014, 11/16/2015, 11/25/2015, 11/13/2016  . PPD Test 06/19/2010, 11/29/2014  . Pneumococcal Conjugate-13 08/11/2014  . Pneumococcal Polysaccharide-23 02/04/2003  . Pneumococcal-Unspecified 06/07/2014  . Tdap 02/27/2013  . Zoster 02/03/2006   Pertinent  Health Maintenance Due  Topic Date Due  . FOOT EXAM  02/07/2017  .  DEXA SCAN  07/18/2026 (Originally 10/31/1984)  . OPHTHALMOLOGY EXAM  07/22/2017  . INFLUENZA VACCINE  09/03/2017  . HEMOGLOBIN A1C  10/25/2017  . PNA vac Low Risk Adult  Completed   Fall Risk  02/02/2017 12/06/2014 07/11/2014 05/22/2014 11/07/2013  Falls in the past year? - Yes No Yes No  Number falls in past yr: - 1 -  1 -  Injury with Fall? - No - No -  Risk for fall due to : History of fall(s);Impaired mobility History of fall(s);Impaired mobility - History of fall(s);Impaired balance/gait -  Follow up - - - Falls evaluation completed -   Functional Status Survey:    Vitals:   05/18/17 1141  BP: (!) 171/72  Pulse: 69  Resp: 18  Temp: (!) 96 F (35.6 C)  SpO2: 94%  Weight: 215 lb 1.6 oz (97.6 kg)  Height: 5\' 7"  (1.702 m)   Body mass index is 33.69 kg/m. Physical Exam  Constitutional: She is oriented to person, place, and time. She appears well-developed and well-nourished.  Elderly in no acute distress   HENT:  Head: Normocephalic.  Right Ear: External ear normal.  Left Ear: External ear normal.  Mouth/Throat: Oropharynx is clear and moist. No oropharyngeal exudate.  Left hearing aid in place   Eyes: Pupils are equal, round, and reactive to light. Conjunctivae and EOM are normal. Right eye exhibits no discharge. Left eye exhibits no discharge. No scleral icterus.  Eye glasses in place   Neck: Normal range of motion. No JVD present. No thyromegaly present.  Cardiovascular: Normal rate, regular rhythm, normal heart sounds and intact distal pulses. Exam reveals no gallop and no friction rub.  No murmur heard. Pulmonary/Chest: Effort normal. No respiratory distress. She has no wheezes. She has no rales.  Poor air entry oxygen 2 liters via nasal cannula in place.    Abdominal: Soft. Bowel sounds are normal. She exhibits no distension. There is no tenderness. There is no rebound and no guarding.  Genitourinary:  Genitourinary Comments: Continent   Musculoskeletal:  Limited ROM  to left shoulder.arthritic changes to fingers worst on right hand.Unsteady gait uses wheelchair for short distance and scooter for longer distance. Bilateral knee high ted hose in place   Lymphadenopathy:    She has no cervical adenopathy.  Neurological: She is oriented to person, place, and time. Gait abnormal.  Skin: Skin is warm and dry. No rash noted. No erythema. No pallor.  Psychiatric: She has a normal mood and affect. Her speech is normal and behavior is normal. Judgment and thought content normal.    Labs reviewed: Recent Labs    12/08/16 01/08/17 04/24/17  NA 136*  136 137  137 140  K 4.3  4.3 3.8  3.8 4.7  CL  --  100 100  CO2  --  30 36  BUN 40*  40* 46* 47*  CREATININE 1.1  1.06 1.0  1.03 1.2*  CALCIUM 9.0 9.0  9.0 9.0   Recent Labs    07/10/16 08/18/16 04/24/17  AST 14 20 15   ALT 12 17 11   ALKPHOS 86 80 106  PROT  --   --  5.3  ALBUMIN  --   --  3.2   Recent Labs    07/10/16 12/08/16 04/24/17  WBC 8.1 9.1  9.1 7.9  HGB 9.8* 11.4*  11.4 10.0*  HCT 30* 34*  33.8 30*  PLT 168 235 186   Lab Results  Component Value Date   TSH 2.61 07/10/2016   Lab Results  Component Value Date   HGBA1C 6.9 04/24/2017   Lab Results  Component Value Date   CHOL 117 12/15/2016   HDL 52 12/15/2016   LDLCALC 40 12/15/2016   TRIG 186 (A) 12/15/2016    Significant Diagnostic Results in last 30 days:  No results found.  Assessment/Plan 1. Hypertensive heart and kidney disease  with chronic combined systolic and diastolic congestive heart failure and stage 3 chronic kidney disease Elevated systolic blood pressure readings on review.asymptomatic.Negative exam findings.continue on Losartan 100 mg tablet daily and spironolactone 12.5 mg tablet daily.Increase metoprolol tartrate from 37.5 mg tablet twice daily to 50 mg tablet twice daily.Monitor blood pressure and Heart rate twice daily hold metoprolol if SBP < 110 or HR < 60 b/min.continue to monitor.    2. Controlled  type 2 diabetes mellitus with stage 3 chronic kidney disease, with long-term current use of insulin  Lab Results  Component Value Date   HGBA1C 6.9 04/24/2017   CBG readings in the 80's-150's in the morning and 90's-200's with occasional 300's possible due to snacks.enouraged to eat bedtime snack daily.will continue on lantus 28 units daily at bedtime and Humalog 12 units at 12:30 and 17:30.continue to monitor for signs and symptoms of hypo/hyperglycemia.   3. Chronic obstructive pulmonary disease Breathing stable.No increase in sputum production.continue DuoNeB  Three times daily,Advair one puff twice daily and Mucinex 600 mg tablet twice daily.continue on oxygen via nasal cannula.continue to monitor.   4. Gastroesophageal reflux disease without esophagitis Stable.continue on omeprazole 20 mg tablet daily.  5. Pain in joint of left shoulder Worst on left shoulder.continue on Aspercreme 10 % cream application three times daily and Tylenol 1000 mg tablet daily at bedtime.   Family/ staff Communication: Reviewed plan of care with patient and facility Nurse.   Labs/tests ordered: None   Chara Marquard C Jamielyn Petrucci, NP

## 2017-06-05 DIAGNOSIS — E1159 Type 2 diabetes mellitus with other circulatory complications: Secondary | ICD-10-CM | POA: Diagnosis not present

## 2017-06-05 DIAGNOSIS — L84 Corns and callosities: Secondary | ICD-10-CM | POA: Diagnosis not present

## 2017-06-05 DIAGNOSIS — B351 Tinea unguium: Secondary | ICD-10-CM | POA: Diagnosis not present

## 2017-06-12 ENCOUNTER — Non-Acute Institutional Stay (SKILLED_NURSING_FACILITY): Payer: Medicare Other | Admitting: Family

## 2017-06-12 ENCOUNTER — Encounter: Payer: Self-pay | Admitting: Family

## 2017-06-12 DIAGNOSIS — Z794 Long term (current) use of insulin: Secondary | ICD-10-CM | POA: Diagnosis not present

## 2017-06-12 DIAGNOSIS — I130 Hypertensive heart and chronic kidney disease with heart failure and stage 1 through stage 4 chronic kidney disease, or unspecified chronic kidney disease: Secondary | ICD-10-CM

## 2017-06-12 DIAGNOSIS — E1122 Type 2 diabetes mellitus with diabetic chronic kidney disease: Secondary | ICD-10-CM | POA: Diagnosis not present

## 2017-06-12 DIAGNOSIS — N183 Chronic kidney disease, stage 3 unspecified: Secondary | ICD-10-CM

## 2017-06-12 DIAGNOSIS — G4709 Other insomnia: Secondary | ICD-10-CM

## 2017-06-12 DIAGNOSIS — J449 Chronic obstructive pulmonary disease, unspecified: Secondary | ICD-10-CM

## 2017-06-12 DIAGNOSIS — I5042 Chronic combined systolic (congestive) and diastolic (congestive) heart failure: Secondary | ICD-10-CM

## 2017-06-12 DIAGNOSIS — K219 Gastro-esophageal reflux disease without esophagitis: Secondary | ICD-10-CM

## 2017-06-12 NOTE — Progress Notes (Signed)
Location:  [place] Room Number: 16 Place of Service:  SNF ([phone]) Provider: Dinah Ngetich FNP-C  Blanchie Serve, MD  Patient Care Team: Blanchie Serve, MD as PCP - General (Internal Medicine) Clent Jacks, MD (Ophthalmology) Adrian Prows, MD as Attending Physician (Cardiology) Wickes, [facility] [name], MD as Consulting Physician (Orthopedic Surgery) Clance, Armando Reichert, MD as Consulting Physician (Pulmonary Disease) Ngetich, Nelda Bucks, NP as Nurse Practitioner [facility] Medicine)  Extended Emergency Contact Information Primary Emergency Contact: Bivins,[name] Address: [address]          [place], [place] [address] [name] of [place] Phone: [phone] Mobile Phone: [phone] Relation: Son Secondary Emergency Contact: [name] [place] of [place] Mobile Phone: [phone] Relation: Daughter  Code Status:  DNR Goals of care: Advanced Directive information Advanced Directives 06/12/2017  Does Patient Have a Medical Advance Directive? Yes  Type of Paramedic of [place];Living will;Out of facility DNR (pink MOST or yellow form)  Does patient want to make changes to medical advance directive? -  Copy of [place] in Chart? Yes  Pre-existing out of facility DNR order (yellow form or pink MOST form) Yellow form placed in chart (order not valid for inpatient use)     Chief Complaint  Patient presents with  . Medical Management of Chronic Issues    HPI:  Pt is a 84 y.o. female seen today at [facility] for medical management of chronic issues.she has a significant medical history of HTN,type 2 DM,CKD stage 3,COPD on continuous oxygen via nasal cannula ,GERD,Osteoathritis among other conditions.she is seen in her room today.she denies any acute issues during visit.Facility Nurse reports no new concerns.she continues to use her scooter for long distance and her standard wheelchair for  short distances.she requires assistance with her ADL's due to arthritis.No recent weight changes or fall episodes. Her CBG log reviewed readings in the 70's-140's in the morning and 70's-200's with episodic readings in the 300's suspect due to snacks.she states has a bunch of snacks in the room including Oreo cookies.Her systolic blood pressure readings ranging in the 160's-170's. Metoprolol was increased to 50 mg tablet twice daily in previous visit. Overall B/p readings 130's/70's-150's/60's.   Past Medical History:  Diagnosis Date  . Abnormality of gait   . Anemia, unspecified   . Arthritis   . Bronchiectasis without acute exacerbation ([place]) 04/10/2011   CT chest 2011   . Cataract 1990   Dr. Katy Fitch  . Cholelithiases   . Closed fracture of unspecified part of ramus of mandible   . COPD (chronic obstructive pulmonary disease) ([place])   . Coronary atherosclerosis of unspecified type of vessel, native or graft   . Disturbance of skin sensation   . DOE (dyspnea on exertion) 04/10/2011   PFTs 2013:  FEV1 1.04 (78%), ratio 64, couldn't do maneuver for lung volumes or dlco   . Edema   . Esophageal reflux   . Fall from other slipping, tripping, or stumbling   . Gastric ulcer with hemorrhage 2008  . Hypertension   . Hypoxemia   . Insomnia, unspecified   . Mucopurulent chronic bronchitis ([place])   . Nodule of neck 11/07/2013   Left neck posteriorly   . Nontoxic uninodular goiter   . Osteoarthrosis, unspecified whether generalized or localized, unspecified site   . Other and unspecified hyperlipidemia   . Other malaise and fatigue   . Pain in joint, lower leg 2010   right  knee  . Pain in joint, shoulder region 2013   left  . Palpitations   . Pneumonia, organism unspecified(486)   . Regional enteritis of unspecified site   . Rosacea   . Sciatica   . Squamous cell carcinoma of skin of lower limb, including hip 07/27/2012   Nonhealing lesion of the left mid calf medially   . Tachycardia 12/06/2014   . Tension headache   . Tension headache   . Type II or unspecified type diabetes mellitus without mention of complication, not stated as uncontrolled   . Unspecified venous (peripheral) insufficiency   . Urine, incontinence, stress female 07/19/2013   Past Surgical History:  Procedure Laterality Date  . EYE SURGERY Bilateral 1990   Dr. Katy Fitch  . GASTROJEJUNOSTOMY  05/2004   secondary to ulcer  . JOINT REPLACEMENT Bilateral 1993-1998   total knee replacement  . MOLE REMOVAL  05/11/14   left hand and left side of face [facility]  . OTHER SURGICAL HISTORY  2008   Ulcer surgery   . SMALL INTESTINE SURGERY    . TOTAL KNEE ARTHROPLASTY Bilateral 1993, 1998   knees    Allergies  Allergen Reactions  . Adhesive [Tape]     unknown  . Aspirin     unknown  . Augmentin [Amoxicillin-Pot Clavulanate]   . Codeine     unknown  . Diclofenac   . Enalapril Cough  . Hydrocodone     unknown  . Pantoprazole Sodium     unknown  . Voltaren [Diclofenac Sodium]     Outpatient Encounter Medications as of 06/12/2017  Medication Sig  . acetaminophen (TYLENOL) 325 MG tablet Take 650 mg by mouth every 6 (six) hours as needed for moderate pain.   Marland Kitchen acetaminophen (TYLENOL) 500 MG tablet Take 1,000 mg by mouth at bedtime.   Marland Kitchen acetaminophen (TYLENOL) 500 MG tablet Take 500 mg by mouth daily.  . Fluticasone-Salmeterol (ADVAIR) 100-50 MCG/DOSE AEPB Inhale 1 puff into the lungs 2 (two) times daily.  . furosemide (LASIX) 40 MG tablet Take 40 mg by mouth 2 (two) times daily. Hold for SBP <110  . guaiFENesin (MUCINEX) 600 MG 12 hr tablet Take 600 mg by mouth 2 (two) times daily.  Marland Kitchen guaifenesin (ROBITUSSIN) 100 MG/5ML syrup Take by mouth every 6 (six) hours as needed for cough (Give 10 mL).  . insulin glargine (LANTUS) 100 UNIT/ML injection Inject 28 Units into the skin at bedtime.   . insulin lispro (HUMALOG) 100 UNIT/ML cartridge Inject 12 Units into the skin. At 1230 PM and 530 PM. (If blood sugar  is 150-250 = 8 units, if blood sugar is greater than 250 = 12 units daily)  . ipratropium-albuterol (DUONEB) 0.5-2.5 (3) MG/3ML SOLN Take 3 mLs by nebulization every 8 (eight) hours as needed.   Marland Kitchen ipratropium-albuterol (DUONEB) 0.5-2.5 (3) MG/3ML SOLN Take 3 mLs by nebulization 2 (two) times daily.  Marland Kitchen loperamide (IMODIUM A-D) 2 MG tablet Take 2 mg by mouth as needed for diarrhea or loose stools. Give 4 mg as the initial dose. Then give 2 mg after each loose stool  . loratadine (CLARITIN) 10 MG tablet Take 10 mg by mouth daily as needed for allergies.  Marland Kitchen losartan (COZAAR) 100 MG tablet Take 100 mg daily by mouth.  . metoprolol tartrate (LOPRESSOR) 50 MG tablet Take 50 mg by mouth 2 (two) times daily.  . mineral oil-hydrophilic petrolatum (AQUAPHOR) ointment Apply 1 application daily as needed topically for dry skin (to the left  upper arm).  . Multiple Vitamins-Minerals (MULTIVITAMIN WITH MINERALS) tablet Take 1 tablet daily by mouth.  Marland Kitchen omeprazole (PRILOSEC) 20 MG capsule Take 20 mg by mouth daily.   . OXYGEN Inhale 1.5 L/min into the lungs.   . predniSONE (DELTASONE) 5 MG tablet Take 5 mg by mouth daily with breakfast.  . spironolactone (ALDACTONE) 25 MG tablet Take 12.5 mg by mouth daily.   . traZODone (DESYREL) 50 MG tablet Take 50 mg by mouth at bedtime.  . trolamine salicylate (ASPERCREME) 10 % cream Apply 1 application topically 3 (three) times daily. Both knees  . [DISCONTINUED] metoprolol tartrate (LOPRESSOR) 25 MG tablet Take 37.5 mg by mouth 2 (two) times daily. Hold for SBP <110   No facility-administered encounter medications on file as of 06/12/2017.     Review of Systems  Constitutional: Negative for appetite change, chills, fatigue, fever and unexpected weight change.  HENT: Positive for hearing loss. Negative for congestion, rhinorrhea, sinus pressure, sinus pain, sneezing, sore throat and trouble swallowing.   Eyes: Positive for visual disturbance. Negative for pain, discharge,  redness and itching.       Wears eye glasses   Respiratory: Negative for cough, chest tightness, shortness of breath and wheezing.   Cardiovascular: Positive for leg swelling. Negative for chest pain and palpitations.  Gastrointestinal: Negative for abdominal distention, abdominal pain, constipation, diarrhea, nausea and vomiting.  Endocrine: Negative for cold intolerance, heat intolerance, polydipsia, polyphagia and polyuria.  Genitourinary: Negative for difficulty urinating, dysuria, flank pain, frequency and urgency.  Musculoskeletal: Positive for arthralgias and gait problem.  Skin: Negative for color change, pallor, rash and wound.  Psychiatric/Behavioral: Negative for agitation and confusion. The patient is not nervous/anxious.        Current trazodone effective for lack of sleep.     Immunization History  Administered Date(s) Administered  . Influenza Split 11/06/2011, 11/04/2012  . Influenza,inj,Quad PF,6+ Mos 11/17/2013  . Influenza-Unspecified 11/09/2014, 11/16/2015, 11/25/2015, 11/13/2016  . PPD Test 06/19/2010, 11/29/2014  . Pneumococcal Conjugate-13 08/11/2014  . Pneumococcal Polysaccharide-23 02/04/2003  . Pneumococcal-Unspecified 06/07/2014  . Tdap 02/27/2013  . Zoster 02/03/2006   Pertinent  Health Maintenance Due  Topic Date Due  . FOOT EXAM  02/07/2017  . DEXA SCAN  07/18/2026 (Originally 10/31/1984)  . OPHTHALMOLOGY EXAM  07/22/2017  . INFLUENZA VACCINE  09/03/2017  . HEMOGLOBIN A1C  10/25/2017  . PNA vac Low Risk Adult  Completed   Fall Risk  02/02/2017 12/06/2014 07/11/2014 05/22/2014 11/07/2013  Falls in the past year? - Yes No Yes No  Number falls in past yr: - 1 - 1 -  Injury with Fall? - No - No -  Risk for fall due to : History of fall(s);Impaired mobility History of fall(s);Impaired mobility - History of fall(s);Impaired balance/gait -  Follow up - - - Falls evaluation completed -    Vitals:   06/12/17 1156  BP: (!) 166/66  Pulse: 78  Resp: 15    Temp: (!) 95.9 F (35.5 C)  SpO2: 96%  Weight: 215 lb (97.5 kg)  Height: 5\' 7"  (1.702 m)   Body mass index is 33.67 kg/m. Physical Exam  Constitutional: She is oriented to person, place, and time.  Obese elderly in no acute distress   HENT:  Head: Normocephalic.  Right Ear: External ear normal.  Left Ear: External ear normal.  Mouth/Throat: Oropharynx is clear and moist. No oropharyngeal exudate.  Hearing aid in place   Eyes: Pupils are equal, round, and reactive to light. Conjunctivae  and EOM are normal. Right eye exhibits no discharge. Left eye exhibits no discharge. No scleral icterus.  Neck: Normal range of motion. No JVD present. No thyromegaly present.  Cardiovascular: Normal rate, regular rhythm, normal heart sounds and intact distal pulses. Exam reveals no gallop and no friction rub.  No murmur heard. Pulmonary/Chest: Effort normal and breath sounds normal. No respiratory distress. She has no wheezes. She has no rales.  Oxygen 2 liters via nasal cannula in place   Abdominal: Soft. Bowel sounds are normal. She exhibits no distension and no mass. There is no tenderness. There is no rebound and no guarding.  Musculoskeletal: She exhibits no tenderness.  Unsteady gait spends most time on wheelchair but uses scooter for long distance.Bilateral lower extremities trace edema.   Lymphadenopathy:    She has no cervical adenopathy.  Neurological: She is oriented to person, place, and time. Gait abnormal.  HOH   Skin: Skin is warm and dry. No rash noted. No erythema. No pallor.  Psychiatric: She has a normal mood and affect. Her behavior is normal.  Nursing note and vitals reviewed.  Labs reviewed: Recent Labs    12/08/16 01/08/17 04/24/17  NA 136*  136 137  137 140  K 4.3  4.3 3.8  3.8 4.7  CL  --  100 100  CO2  --  30 36  BUN 40*  40* 46* 47*  CREATININE 1.1  1.06 1.0  1.03 1.2*  CALCIUM 9.0 9.0  9.0 9.0   Recent Labs    07/10/16 08/18/16 04/24/17  AST 14 20 15    ALT 12 17 11   ALKPHOS 86 80 106  PROT  --   --  5.3  ALBUMIN  --   --  3.2   Recent Labs    07/10/16 12/08/16 04/24/17  WBC 8.1 9.1  9.1 7.9  HGB 9.8* 11.4*  11.4 10.0*  HCT 30* 34*  33.8 30*  PLT 168 235 186   Lab Results  Component Value Date   TSH 2.61 07/10/2016   Lab Results  Component Value Date   HGBA1C 6.9 04/24/2017   Lab Results  Component Value Date   CHOL 117 12/15/2016   HDL 52 12/15/2016   LDLCALC 40 12/15/2016   TRIG 186 (A) 12/15/2016    Significant Diagnostic Results in last 30 days:  No results found.  Assessment/Plan 1. Controlled type 2 diabetes mellitus with stage 3 chronic kidney disease, with long-term current use of insulin  Lab Results  Component Value Date   HGBA1C 6.9 04/24/2017   CBG log reviewed.Low CBG readings in the 70's noted in the morning and afternoon.encouraged to eat heart healthy low carb snacks.decrease Lantus from 28 to 26 units SQ at bedtime.continue on Humalog 12 units SQ twice daily.On losartan but off Statin due to advance age to prevent muscle weakness.continue to monitor Hgb A1C.    2. Hypertensive heart and kidney disease with chronic combined systolic and diastolic congestive heart failure and stage 3 chronic kidney disease  B/p log reviewed.Occassional Systolic blood pressure in the 160's-170's.continue on losartan 100 mg tablet daily and metoprolol tartrate 50 mg tablet twice daily.increase spironolactone from 12.5 mg tablet to 25 mg tablet daily.continue to monitor blood pressure.  3. Chronic obstructive pulmonary disease, unspecified COPD type Breathing is stable.continue on DuoNeb every 8 hours and as needed,Advair 100-50 mcg inhale one puff twice daily and Loratadine 10 mg tablet daily as needed.continue to monitor.   4. Other insomnia Current regimen effective.continue on  Trazodone 50 mg tablet daily at bedtime.   5. Gastroesophageal reflux disease without esophagitis Asymptomatic.continue on omeprazole 20  mg capsule daily. Continue to monitor mg and vitamin B12 level.   Family/ staff Communication: Reviewed plan of care with patient and facility Nurse.  Labs/tests ordered: CBC and BMP 06/18/2017   Sandrea Hughs, NP

## 2017-06-18 ENCOUNTER — Encounter: Payer: Self-pay | Admitting: *Deleted

## 2017-06-18 DIAGNOSIS — R531 Weakness: Secondary | ICD-10-CM | POA: Diagnosis not present

## 2017-06-18 DIAGNOSIS — E1151 Type 2 diabetes mellitus with diabetic peripheral angiopathy without gangrene: Secondary | ICD-10-CM | POA: Diagnosis not present

## 2017-06-18 LAB — CBC AND DIFFERENTIAL
HCT: 32 — AB (ref 36–46)
Hemoglobin: 10.9 — AB (ref 12.0–16.0)
Platelets: 216 (ref 150–399)
WBC: 8.5

## 2017-06-18 LAB — CBC
HCT: 32.2
HEMOGLOBIN: 10.9
PLATELET COUNT: 216
WBC: 8.5

## 2017-06-18 LAB — BASIC METABOLIC PANEL
BUN: 41 — AB (ref 4–21)
BUN: 41 — AB (ref 4–21)
CREATININE: 1.09
Calcium: 9.1
Creatinine: 1.1 (ref 0.5–1.1)
EGFR (Non-African Amer.): 43
GLUCOSE: 114
GLUCOSE: 114
POTASSIUM: 4.6 (ref 3.4–5.3)
Potassium: 4.6
Sodium: 138
Sodium: 138 (ref 137–147)

## 2017-07-08 ENCOUNTER — Encounter: Payer: Self-pay | Admitting: Family

## 2017-07-08 ENCOUNTER — Non-Acute Institutional Stay (SKILLED_NURSING_FACILITY): Payer: Medicare Other | Admitting: Family

## 2017-07-08 DIAGNOSIS — I130 Hypertensive heart and chronic kidney disease with heart failure and stage 1 through stage 4 chronic kidney disease, or unspecified chronic kidney disease: Secondary | ICD-10-CM | POA: Diagnosis not present

## 2017-07-08 DIAGNOSIS — J181 Lobar pneumonia, unspecified organism: Secondary | ICD-10-CM

## 2017-07-08 DIAGNOSIS — J189 Pneumonia, unspecified organism: Secondary | ICD-10-CM

## 2017-07-08 DIAGNOSIS — J449 Chronic obstructive pulmonary disease, unspecified: Secondary | ICD-10-CM

## 2017-07-08 DIAGNOSIS — I5042 Chronic combined systolic (congestive) and diastolic (congestive) heart failure: Secondary | ICD-10-CM | POA: Diagnosis not present

## 2017-07-08 DIAGNOSIS — N183 Chronic kidney disease, stage 3 (moderate): Secondary | ICD-10-CM | POA: Diagnosis not present

## 2017-07-08 DIAGNOSIS — R05 Cough: Secondary | ICD-10-CM | POA: Diagnosis not present

## 2017-07-08 NOTE — Progress Notes (Signed)
Location:  [facility] Room Number: 16 Place of Service:  SNF [phone]) Provider: Kalan Yeley FNP-C  Blanchie Serve, MD  Patient Care Team: Blanchie Serve, MD as PCP - General (Internal Medicine) Clent Jacks, MD (Ophthalmology) Adrian Prows, MD as Attending Physician (Cardiology) [place], [facility] [name], MD as Consulting Physician (Orthopedic Surgery) Clance, Armando Reichert, MD as Consulting Physician (Pulmonary Disease) Alahni Varone, Nelda Bucks, NP as Nurse Practitioner [facility] Medicine)  Extended Emergency Contact Information Primary Emergency Contact: Wieczorek,[name] Address: [address]          [place], [place] [address] [name] of [place] Phone: [phone] Mobile Phone: [phone] Relation: Son Secondary Emergency Contact: [name] [place] of [place] Mobile Phone: [phone] Relation: Daughter  Code Status:  DNR Goals of care: Advanced Directive information Advanced Directives 07/08/2017  Does Patient Have a Medical Advance Directive? Yes  Type of Paramedic of [place];Out of facility DNR (pink MOST or yellow form);Living will  Does patient want to make changes to medical advance directive? -  Copy of [place] in Chart? Yes  Pre-existing out of facility DNR order (yellow form or pink MOST form) Yellow form placed in chart (order not valid for inpatient use)     Chief Complaint  Patient presents with  . Acute Visit    cough, congestion    HPI:  Pt is a 84 y.o. female seen today at [facility] for an acute visit for evaluation of cough and congestion.she is seen in her room today with facility Nurse supervisor present at bedside.Nurse reports patient has had worsening cough and congestion.Patient states coughing white Phlegm at times.she states still has shortness of breath with exertion.she denies any fever or chills.she has had increased wheezing per nursing but has been refusing  her advair.Patient states does not want to take her Advair too close to other inhalers.Agreed to use Advair if separated from other inhalers.    Past Medical History:  Diagnosis Date  . Abnormality of gait   . Anemia, unspecified   . Arthritis   . Bronchiectasis without acute exacerbation ([place]) 04/10/2011   CT chest 2011   . Cataract 1990   Dr. Katy Fitch  . Cholelithiases   . Closed fracture of unspecified part of ramus of mandible   . COPD (chronic obstructive pulmonary disease) ([place])   . Coronary atherosclerosis of unspecified type of vessel, native or graft   . Disturbance of skin sensation   . DOE (dyspnea on exertion) 04/10/2011   PFTs 2013:  FEV1 1.04 (78%), ratio 64, couldn't do maneuver for lung volumes or dlco   . Edema   . Esophageal reflux   . Fall from other slipping, tripping, or stumbling   . Gastric ulcer with hemorrhage 2008  . Hypertension   . Hypoxemia   . Insomnia, unspecified   . Mucopurulent chronic bronchitis ([place])   . Nodule of neck 11/07/2013   Left neck posteriorly   . Nontoxic uninodular goiter   . Osteoarthrosis, unspecified whether generalized or localized, unspecified site   . Other and unspecified hyperlipidemia   . Other malaise and fatigue   . Pain in joint, lower leg 2010   right knee  . Pain in joint, shoulder region 2013   left  . Palpitations   . Pneumonia, organism unspecified(486)   . Regional enteritis of unspecified site   . Rosacea   . Sciatica   . Squamous cell carcinoma of skin of  lower limb, including hip 07/27/2012   Nonhealing lesion of the left mid calf medially   . Tachycardia 12/06/2014  . Tension headache   . Tension headache   . Type II or unspecified type diabetes mellitus without mention of complication, not stated as uncontrolled   . Unspecified venous (peripheral) insufficiency   . Urine, incontinence, stress female 07/19/2013   Past Surgical History:  Procedure Laterality Date  . EYE SURGERY Bilateral 1990   Dr. Katy Fitch    . GASTROJEJUNOSTOMY  05/2004   secondary to ulcer  . JOINT REPLACEMENT Bilateral 1993-1998   total knee replacement  . MOLE REMOVAL  05/11/14   left hand and left side of [facility]  . OTHER SURGICAL HISTORY  2008   Ulcer surgery   . SMALL INTESTINE SURGERY    . TOTAL KNEE ARTHROPLASTY Bilateral 1993, 1998   knees    Allergies  Allergen Reactions  . Adhesive [Tape]     unknown  . Aspirin     unknown  . Augmentin [Amoxicillin-Pot Clavulanate]   . Codeine     unknown  . Diclofenac   . Enalapril Cough  . Hydrocodone     unknown  . Pantoprazole Sodium     unknown  . Voltaren [Diclofenac Sodium]     Outpatient Encounter Medications as of 07/08/2017  Medication Sig  . acetaminophen (TYLENOL) 325 MG tablet Take 650 mg by mouth every 6 (six) hours as needed for moderate pain.   Marland Kitchen acetaminophen (TYLENOL) 500 MG tablet Take 1,000 mg by mouth at bedtime.   Marland Kitchen acetaminophen (TYLENOL) 500 MG tablet Take 500 mg by mouth daily.  . Fluticasone-Salmeterol (ADVAIR) 100-50 MCG/DOSE AEPB Inhale 1 puff into the lungs 2 (two) times daily.  . furosemide (LASIX) 40 MG tablet Take 40 mg by mouth 2 (two) times daily. Hold for SBP <110  . guaiFENesin (MUCINEX) 600 MG 12 hr tablet Take 600 mg by mouth 2 (two) times daily.  Marland Kitchen guaifenesin (ROBITUSSIN) 100 MG/5ML syrup Take by mouth every 6 (six) hours as needed for cough (Give 10 mL).  . insulin glargine (LANTUS) 100 UNIT/ML injection Inject 26 Units into the skin at bedtime.   . insulin lispro (HUMALOG) 100 UNIT/ML cartridge Inject 12 Units into the skin. At 1230 PM and 530 PM. (If blood sugar is 150-250 = 8 units, if blood sugar is greater than 250 = 12 units daily)  . ipratropium-albuterol (DUONEB) 0.5-2.5 (3) MG/3ML SOLN Take 3 mLs by nebulization every 8 (eight) hours as needed.   Marland Kitchen ipratropium-albuterol (DUONEB) 0.5-2.5 (3) MG/3ML SOLN Take 3 mLs by nebulization 2 (two) times daily.  Marland Kitchen loperamide (IMODIUM A-D) 2 MG tablet Take 2 mg by  mouth as needed for diarrhea or loose stools. Give 4 mg as the initial dose. Then give 2 mg after each loose stool  . loratadine (CLARITIN) 10 MG tablet Take 10 mg by mouth daily as needed for allergies.  Marland Kitchen losartan (COZAAR) 100 MG tablet Take 100 mg daily by mouth.  . metoprolol tartrate (LOPRESSOR) 50 MG tablet Take 50 mg by mouth 2 (two) times daily.  . mineral oil-hydrophilic petrolatum (AQUAPHOR) ointment Apply 1 application daily as needed topically for dry skin (to the left upper arm).  . Multiple Vitamins-Minerals (MULTIVITAMIN WITH MINERALS) tablet Take 1 tablet daily by mouth.  Marland Kitchen omeprazole (PRILOSEC) 20 MG capsule Take 20 mg by mouth daily.   . OXYGEN Inhale 1.5 L/min into the lungs.   . predniSONE (DELTASONE) 5  MG tablet Take 5 mg by mouth daily with breakfast.  . spironolactone (ALDACTONE) 25 MG tablet Take 12.5 mg by mouth daily.   . traZODone (DESYREL) 50 MG tablet Take 50 mg by mouth at bedtime.  . trolamine salicylate (ASPERCREME) 10 % cream Apply 1 application topically 3 (three) times daily. Both knees   No facility-administered encounter medications on file as of 07/08/2017.     Review of Systems  Constitutional: Negative for appetite change, chills, fatigue and fever.  HENT: Positive for hearing loss. Negative for congestion, rhinorrhea, sinus pressure, sinus pain and sneezing.   Respiratory: Positive for cough, shortness of breath and wheezing. Negative for chest tightness.        Shortness of breath with exertion   Cardiovascular: Positive for leg swelling. Negative for chest pain and palpitations.  Gastrointestinal: Negative for abdominal distention, abdominal pain, constipation, diarrhea, nausea and vomiting.  Genitourinary: Negative for dysuria, flank pain, frequency and urgency.  Skin: Negative for color change, pallor and rash.  Psychiatric/Behavioral: Negative for agitation, confusion and sleep disturbance. The patient is not nervous/anxious.     Immunization  History  Administered Date(s) Administered  . Influenza Split 11/06/2011, 11/04/2012  . Influenza,inj,Quad PF,6+ Mos 11/17/2013  . Influenza-Unspecified 11/09/2014, 11/16/2015, 11/25/2015, 11/13/2016  . PPD Test 06/19/2010, 11/29/2014  . Pneumococcal Conjugate-13 08/11/2014  . Pneumococcal Polysaccharide-23 02/04/2003  . Pneumococcal-Unspecified 06/07/2014  . Tdap 02/27/2013  . Zoster 02/03/2006   Pertinent  Health Maintenance Due  Topic Date Due  . FOOT EXAM  02/07/2017  . DEXA SCAN  07/18/2026 (Originally 10/31/1984)  . OPHTHALMOLOGY EXAM  07/22/2017  . INFLUENZA VACCINE  09/03/2017  . HEMOGLOBIN A1C  10/25/2017  . PNA vac Low Risk Adult  Completed   Fall Risk  02/02/2017 12/06/2014 07/11/2014 05/22/2014 11/07/2013  Falls in the past year? - Yes No Yes No  Number falls in past yr: - 1 - 1 -  Injury with Fall? - No - No -  Risk for fall due to : History of fall(s);Impaired mobility History of fall(s);Impaired mobility - History of fall(s);Impaired balance/gait -  Follow up - - - Falls evaluation completed -    Vitals:   07/08/17 1109  BP: (!) 172/72  Pulse: 78  Resp: (!) 24  Temp: (!) 97 F (36.1 C)  SpO2: 96%  Weight: 215 lb (97.5 kg)  Height: 5\' 7"  (1.702 m)   Body mass index is 33.67 kg/m. Physical Exam  Constitutional: She is oriented to person, place, and time.  Obese elderly in no acute distress   HENT:  Head: Normocephalic.  Mouth/Throat: Oropharynx is clear and moist. No oropharyngeal exudate.  Left hearing aid in place   Eyes: Pupils are equal, round, and reactive to light. Conjunctivae and EOM are normal. Right eye exhibits no discharge. Left eye exhibits no discharge. No scleral icterus.  Eye glasses in place   Cardiovascular: Intact distal pulses. Exam reveals no gallop and no friction rub.  Murmur heard. Pulmonary/Chest: Tachypnea noted. She has decreased breath sounds in the right upper field, the right middle field and the right lower field. She has  wheezes in the left upper field. She has rales in the left middle field and the left lower field.  Abdominal: Soft. Bowel sounds are normal. She exhibits no distension and no mass. There is no tenderness. There is no rebound and no guarding.  Musculoskeletal: She exhibits no edema or tenderness.  Moves x 4 extremities.spends most time on wheelchair and uses scooter for long  distance.  Neurological: She is oriented to person, place, and time. Gait abnormal.  Skin: Skin is warm and dry. No rash noted. No erythema. No pallor.  Psychiatric: She has a normal mood and affect. Her speech is normal and behavior is normal. Judgment and thought content normal.  Nursing note and vitals reviewed.   Labs reviewed: Recent Labs    01/08/17 04/24/17 06/18/17  NA 137  137 140 138  K 3.8  3.8 4.7 4.6  CL 100 100  --   CO2 30 36  --   BUN 46* 47* 41*  CREATININE 1.0  1.03 1.2* 1.09  CALCIUM 9.0  9.0 9.0 9.1   Recent Labs    07/10/16 08/18/16 04/24/17  AST 14 20 15   ALT 12 17 11   ALKPHOS 86 80 106  PROT  --   --  5.3  ALBUMIN  --   --  3.2   Recent Labs    07/10/16 12/08/16 04/24/17 06/18/17  WBC 8.1 9.1  9.1 7.9 8.5  HGB 9.8* 11.4*  11.4 10.0* 10.9  HCT 30* 34*  33.8 30* 32.2  PLT 168 235 186  --    Lab Results  Component Value Date   TSH 2.61 07/10/2016   Lab Results  Component Value Date   HGBA1C 6.9 04/24/2017   Lab Results  Component Value Date   CHOL 117 12/15/2016   HDL 52 12/15/2016   LDLCALC 40 12/15/2016   TRIG 186 (A) 12/15/2016    Significant Diagnostic Results in last 30 days:  Chest X-ray results 07/08/2017: Impression:   showed moderate cardiomegaly with mild elevation of the pulmonary vascular suggests mild congestive Heart Failure.Also noted few patchy interstial change left lower lung consistent with both pneumonia and interstitial pulmonary edema  Assessment/Plan 1. CAP Afebrile.Left upper lobe wheezing and rales to mid and lower lobe.Diminished breath  sounds to right lobes.Tachypnea after coming from the bathroom.Chest X-ray ordered due to worsening cough and congestion.X-ray resulted 07/08/2017 showed moderate cardiomegaly with mild elevation of the pulmonary vascular suggests mild congestive Heart Failure.Also noted few patchy interstial change left lower lung consistent with both pneumonia and interstitial pulmonary edema.Continue on Duoneb.start Doxycycline 100 mg tablet twice daily x 10 days along with Florastor 250 mg capsule one by mouth  twice daily x 10 days for prophylaxis.change furosemide to 60 mg tablet daily in the morning and 40 mg tablet at 4:30 Pm x 3 days then resume previous orders.monitor Temp curve.     2. Hypertensive heart and kidney disease with chronic combined systolic and diastolic congestive heart failure and stage 3 chronic kidney disease  B/p reviewed few readings x 3  SBP above 160.Adjust Furosemide as above.continue on Metoprolol 50 mg tablet twice daily,cozaar 100 mg tablet daily and Aldactone 12.5 mg tablet daily.continue to monitor.  3. COPD  Exacerbation with exertion possible multifactorial due to CHF.continue on DuoNebs,Prednisone 5 mg tablet daily,Advair twice daily at 9 Am and 9 pm and continuous oxygen via nasal cannula.    Family/ staff Communication: Reviewed plan of care with patient and facility Nurse supervisor.  Labs/tests ordered: Portable Chest X-ray Pa/Lateral rule out Pneumonia.   Sandrea Hughs, NP

## 2017-07-13 ENCOUNTER — Encounter: Payer: Self-pay | Admitting: Internal Medicine

## 2017-07-13 ENCOUNTER — Non-Acute Institutional Stay (SKILLED_NURSING_FACILITY): Payer: Medicare Other | Admitting: Internal Medicine

## 2017-07-13 DIAGNOSIS — N183 Chronic kidney disease, stage 3 unspecified: Secondary | ICD-10-CM

## 2017-07-13 DIAGNOSIS — K219 Gastro-esophageal reflux disease without esophagitis: Secondary | ICD-10-CM | POA: Diagnosis not present

## 2017-07-13 DIAGNOSIS — J449 Chronic obstructive pulmonary disease, unspecified: Secondary | ICD-10-CM

## 2017-07-13 DIAGNOSIS — E1122 Type 2 diabetes mellitus with diabetic chronic kidney disease: Secondary | ICD-10-CM

## 2017-07-13 DIAGNOSIS — I5042 Chronic combined systolic (congestive) and diastolic (congestive) heart failure: Secondary | ICD-10-CM | POA: Diagnosis not present

## 2017-07-13 DIAGNOSIS — J189 Pneumonia, unspecified organism: Secondary | ICD-10-CM

## 2017-07-13 DIAGNOSIS — Z794 Long term (current) use of insulin: Secondary | ICD-10-CM

## 2017-07-13 DIAGNOSIS — M542 Cervicalgia: Secondary | ICD-10-CM | POA: Diagnosis not present

## 2017-07-13 LAB — BASIC METABOLIC PANEL
CHLORIDE: 97
Calcium: 9.1
Carbon Dioxide, Total: 34
EGFR (Non-African Amer.): 43

## 2017-07-13 NOTE — Progress Notes (Signed)
Location:  [facility] Room Number: 16 Place of Service:  SNF [phone]) Provider:  Blanchie Serve MD  Blanchie Serve, MD  Patient Care Team: Blanchie Serve, MD as PCP - General (Internal Medicine) Clent Jacks, MD (Ophthalmology) Adrian Prows, MD as Attending Physician (Cardiology) [place], [facility] [name], MD as Consulting Physician (Orthopedic Surgery) Clance, Armando Reichert, MD as Consulting Physician (Pulmonary Disease) Ngetich, Nelda Bucks, NP as Nurse Practitioner [facility] Medicine)  Extended Emergency Contact Information Primary Emergency Contact: Reddoch,[name] Address: [address]          [place], [place] [address] [name] of [place] Phone: [phone] Mobile Phone: [phone] Relation: Son Secondary Emergency Contact: [name] [place] of [place] Mobile Phone: [phone] Relation: Daughter  Code Status:  DNR  Goals of care: Advanced Directive information Advanced Directives 07/13/2017  Does Patient Have a Medical Advance Directive? Yes  Type of Paramedic of [place];Living will;Out of facility DNR (pink MOST or yellow form)  Does patient want to make changes to medical advance directive? No - Patient declined  Copy of [place] in Chart? Yes  Pre-existing out of facility DNR order (yellow form or pink MOST form) Yellow form placed in chart (order not valid for inpatient use)     Chief Complaint  Patient presents with  . Medical Management of Chronic Issues    Routine Visit     HPI:  Pt is a 84 y.o. female seen today for medical management of chronic diseases.   CAP- currently on doxycycline 100 mg bid with florastor   Chronic CHF- has systolic and diastolic CHF. Currently on metoprolol 50 mg bid, cozaar 100 mg daily, aldactone daily and lasix 40 mg bid  COPD- on chronic oxygen with prednsione and bronchodilator. Breathing overall stable at rest, has some dyspnea with exertion.  Denies worsening of cough.  gerd- denies symptom, takes omeprazole 20 mg daily  Type 2 diabetes mellitus with ckd- taking lantus 26 u daily, humalog 12 u bid and  And SSI with lunch and dinner.   Past Medical History:  Diagnosis Date  . Abnormality of gait   . Anemia, unspecified   . Arthritis   . Bronchiectasis without acute exacerbation ([place]) 04/10/2011   CT chest 2011   . Cataract 1990   Dr. Katy Fitch  . Cholelithiases   . Closed fracture of unspecified part of ramus of mandible   . COPD (chronic obstructive pulmonary disease) ([place])   . Coronary atherosclerosis of unspecified type of vessel, native or graft   . Disturbance of skin sensation   . DOE (dyspnea on exertion) 04/10/2011   PFTs 2013:  FEV1 1.04 (78%), ratio 64, couldn't do maneuver for lung volumes or dlco   . Edema   . Esophageal reflux   . Fall from other slipping, tripping, or stumbling   . Gastric ulcer with hemorrhage 2008  . Hypertension   . Hypoxemia   . Insomnia, unspecified   . Mucopurulent chronic bronchitis ([place])   . Nodule of neck 11/07/2013   Left neck posteriorly   . Nontoxic uninodular goiter   . Osteoarthrosis, unspecified whether generalized or localized, unspecified site   . Other and unspecified hyperlipidemia   . Other malaise and fatigue   . Pain in joint, lower leg 2010   right knee  . Pain in joint, shoulder region 2013   left  . Palpitations   . Pneumonia, organism unspecified(486)   . Regional enteritis  of unspecified site   . Rosacea   . Sciatica   . Squamous cell carcinoma of skin of lower limb, including hip 07/27/2012   Nonhealing lesion of the left mid calf medially   . Tachycardia 12/06/2014  . Tension headache   . Tension headache   . Type II or unspecified type diabetes mellitus without mention of complication, not stated as uncontrolled   . Unspecified venous (peripheral) insufficiency   . Urine, incontinence, stress female 07/19/2013   Past Surgical History:  Procedure  Laterality Date  . EYE SURGERY Bilateral 1990   Dr. Katy Fitch  . GASTROJEJUNOSTOMY  05/2004   secondary to ulcer  . JOINT REPLACEMENT Bilateral 1993-1998   total knee replacement  . MOLE REMOVAL  05/11/14   left hand and left side of face [facility]  . OTHER SURGICAL HISTORY  2008   Ulcer surgery   . SMALL INTESTINE SURGERY    . TOTAL KNEE ARTHROPLASTY Bilateral 1993, 1998   knees    Allergies  Allergen Reactions  . Adhesive [Tape]     unknown  . Aspirin     unknown  . Augmentin [Amoxicillin-Pot Clavulanate]   . Codeine     unknown  . Diclofenac   . Enalapril Cough  . Hydrocodone     unknown  . Pantoprazole Sodium     unknown  . Voltaren [Diclofenac Sodium]     Outpatient Encounter Medications as of 07/13/2017  Medication Sig  . acetaminophen (TYLENOL) 325 MG tablet Take 650 mg by mouth every 6 (six) hours as needed for moderate pain.   Marland Kitchen acetaminophen (TYLENOL) 500 MG tablet Take 1,000 mg by mouth at bedtime.   Marland Kitchen acetaminophen (TYLENOL) 500 MG tablet Take 500 mg by mouth daily.  Marland Kitchen doxycycline (DORYX) 100 MG EC tablet Take 100 mg by mouth 2 (two) times daily. Stop date 07/18/17  . Fluticasone-Salmeterol (ADVAIR) 100-50 MCG/DOSE AEPB Inhale 1 puff into the lungs 2 (two) times daily.  . furosemide (LASIX) 40 MG tablet Take 40 mg by mouth 2 (two) times daily. Hold for SBP <110  . guaiFENesin (MUCINEX) 600 MG 12 hr tablet Take 600 mg by mouth 2 (two) times daily.  Marland Kitchen guaifenesin (ROBITUSSIN) 100 MG/5ML syrup Take by mouth every 6 (six) hours as needed for cough (Give 10 mL).  . insulin glargine (LANTUS) 100 UNIT/ML injection Inject 26 Units into the skin at bedtime.   . insulin lispro (HUMALOG) 100 UNIT/ML cartridge Inject 12 Units into the skin. At 1230 PM and 530 PM. (If blood sugar is 150-250 = 8 units, if blood sugar is greater than 250 = 12 units daily)  . insulin lispro (HUMALOG) 100 UNIT/ML injection Inject 12 Units into the skin 2 (two) times daily.  Marland Kitchen  ipratropium-albuterol (DUONEB) 0.5-2.5 (3) MG/3ML SOLN Take 3 mLs by nebulization every 8 (eight) hours as needed.   Marland Kitchen ipratropium-albuterol (DUONEB) 0.5-2.5 (3) MG/3ML SOLN Take 3 mLs by nebulization 2 (two) times daily.  Marland Kitchen loperamide (IMODIUM A-D) 2 MG tablet Take 2 mg by mouth as needed for diarrhea or loose stools. Give 4 mg as the initial dose. Then give 2 mg after each loose stool  . loratadine (CLARITIN) 10 MG tablet Take 10 mg by mouth daily as needed for allergies.  Marland Kitchen losartan (COZAAR) 100 MG tablet Take 100 mg daily by mouth.  . metoprolol tartrate (LOPRESSOR) 50 MG tablet Take 50 mg by mouth 2 (two) times daily.  . mineral oil-hydrophilic petrolatum (AQUAPHOR) ointment Apply  1 application daily as needed topically for dry skin (to the left upper arm).  . Multiple Vitamins-Minerals (MULTIVITAMIN WITH MINERALS) tablet Take 1 tablet daily by mouth.  Marland Kitchen omeprazole (PRILOSEC) 20 MG capsule Take 20 mg by mouth daily.   . OXYGEN Inhale 1.5 L/min into the lungs.   . predniSONE (DELTASONE) 5 MG tablet Take 5 mg by mouth daily with breakfast.  . saccharomyces boulardii (FLORASTOR) 250 MG capsule Take 250 mg by mouth 2 (two) times daily. Stop date 07/18/17  . spironolactone (ALDACTONE) 25 MG tablet Take 25 mg by mouth daily.   . traZODone (DESYREL) 50 MG tablet Take 50 mg by mouth at bedtime.  . trolamine salicylate (ASPERCREME) 10 % cream Apply 1 application topically 3 (three) times daily. Both knees   No facility-administered encounter medications on file as of 07/13/2017.     Review of Systems  Constitutional: Positive for fatigue. Negative for appetite change, chills, diaphoresis and fever.  HENT: Positive for hearing loss. Negative for congestion, ear pain, mouth sores, postnasal drip, rhinorrhea, sinus pressure and trouble swallowing.   Respiratory: Positive for cough, shortness of breath and wheezing.        Has chronic cough with white phlegm, denies worsening of cough, has increased  dyspnea with mild to moderate exertion, sleeping on recliner chair  Cardiovascular: Positive for leg swelling. Negative for chest pain and palpitations.  Gastrointestinal: Negative for abdominal pain, constipation, diarrhea, nausea and vomiting.  Genitourinary: Negative for dysuria.  Musculoskeletal: Positive for arthralgias, gait problem and neck pain.  Skin: Negative for rash.  Neurological: Negative for dizziness and headaches.  Psychiatric/Behavioral: Negative for behavioral problems, confusion and sleep disturbance.    Immunization History  Administered Date(s) Administered  . Influenza Split 11/06/2011, 11/04/2012  . Influenza,inj,Quad PF,6+ Mos 11/17/2013  . Influenza-Unspecified 11/09/2014, 11/16/2015, 11/25/2015, 11/13/2016  . PPD Test 06/19/2010, 11/29/2014  . Pneumococcal Conjugate-13 08/11/2014  . Pneumococcal Polysaccharide-23 02/04/2003  . Pneumococcal-Unspecified 06/07/2014  . Tdap 02/27/2013  . Zoster 02/03/2006   Pertinent  Health Maintenance Due  Topic Date Due  . FOOT EXAM  02/07/2017  . DEXA SCAN  07/18/2026 (Originally 10/31/1984)  . OPHTHALMOLOGY EXAM  07/22/2017  . INFLUENZA VACCINE  09/03/2017  . HEMOGLOBIN A1C  10/25/2017  . PNA vac Low Risk Adult  Completed   Fall Risk  02/02/2017 12/06/2014 07/11/2014 05/22/2014 11/07/2013  Falls in the past year? - Yes No Yes No  Number falls in past yr: - 1 - 1 -  Injury with Fall? - No - No -  Risk for fall due to : History of fall(s);Impaired mobility History of fall(s);Impaired mobility - History of fall(s);Impaired balance/gait -  Follow up - - - Falls evaluation completed -   Functional Status Survey:    Vitals:   07/13/17 1136  BP: (!) 165/75  Pulse: 62  Resp: 18  Temp: 97.7 F (36.5 C)  TempSrc: Oral  SpO2: 95%  Weight: 215 lb (97.5 kg)  Height: 5\' 7"  (1.702 m)   Body mass index is 33.67 kg/m.   Wt Readings from Last 3 Encounters:  07/13/17 215 lb (97.5 kg)  07/08/17 215 lb (97.5 kg)  06/12/17  215 lb (97.5 kg)   Physical Exam  Constitutional: She is oriented to person, place, and time. She appears well-developed.  Obese elderly female in no acute distress  HENT:  Head: Normocephalic and atraumatic.  Right Ear: External ear normal.  Left Ear: External ear normal.  Nose: Nose normal.  Mouth/Throat: Oropharynx is  clear and moist. No oropharyngeal exudate.  Eyes: Pupils are equal, round, and reactive to light. Conjunctivae and EOM are normal. Right eye exhibits no discharge. Left eye exhibits no discharge.  Wears corrective glasses  Neck: Normal range of motion. Neck supple.  Cardiovascular: Normal rate and regular rhythm.  Murmur heard. Pulmonary/Chest: Effort normal. No respiratory distress.  Poor air movement and has scattered wheezing, no rales  Abdominal: Soft. Bowel sounds are normal. There is no tenderness.  Musculoskeletal: She exhibits edema.  Able to move all 4 extremities, unsteady gait, needs walker and wheelchair for ambulation  Lymphadenopathy:    She has no cervical adenopathy.  Neurological: She is alert and oriented to person, place, and time. She exhibits normal muscle tone.  Skin: Skin is warm and dry. She is not diaphoretic.  Psychiatric: She has a normal mood and affect. Her behavior is normal.    Labs reviewed: Recent Labs    01/08/17 04/24/17 06/18/17  NA 137  137 140 138  138  K 3.8  3.8 4.7 4.6  4.6  CL 100 100 97  CO2 30 36 34  BUN 46* 47* 41*  41*  CREATININE 1.0  1.03 1.2* 1.1  1.09  CALCIUM 9.0  9.0 9.0 9.1  9.1   Recent Labs    08/18/16 04/24/17  AST 20 15  ALT 17 11  ALKPHOS 80 106  PROT  --  5.3  ALBUMIN  --  3.2   Recent Labs    12/08/16 04/24/17 06/18/17  WBC 9.1  9.1 7.9 8.5  8.5  HGB 11.4*  11.4 10.0* 10.9*  10.9  HCT 34*  33.8 30* 32*  32.2  PLT 235 186 216   Lab Results  Component Value Date   TSH 2.61 07/10/2016   Lab Results  Component Value Date   HGBA1C 6.9 04/24/2017   Lab Results    Component Value Date   CHOL 117 12/15/2016   HDL 52 12/15/2016   LDLCALC 40 12/15/2016   TRIG 186 (A) 12/15/2016    Significant Diagnostic Results in last 30 days:  No results found.  Assessment/Plan  1. Chronic combined systolic and diastolic congestive heart failure (HCC) Stable, continue furosemide with losartan, metoprolol tartrate and spironolactone, monitor weight. CMP  2. Chronic obstructive pulmonary disease, unspecified COPD type ([place]) Continue oxygen, prednisone and bronchodilator, monitor for signs of exacerbation  3. Controlled type 2 diabetes mellitus with stage 3 chronic kidney disease, with long-term current use of insulin (HCC) Lab Results  Component Value Date   HGBA1C 6.9 04/24/2017   Continue current regimen of lantus 26 u daily. Before breakfast readings overall stable but with lunch and dineer, several readings with 200s and some in 300s. Change humalog before lunch and dinner to 15 u and monitor. Check a1c in 2 weeks.   4. Neck pain Change scheduled tylenol to 1000 mg bid for now, monitor and reassess if no improvement  5. Community acquired pneumonia, unspecified laterality Continue and complete course of doxycycline 07/18/17 with florastor, breathing improved per pt and staff. Afebrile. monitor  6. Gastroesophageal reflux disease without esophagitis Continue PPI.     Family/ staff Communication: reviewed care plan with patient and charge nurse.    Labs/tests ordered:  a1c   Blanchie Serve, MD Internal Medicine [facility] [address] [place], [place] [address] Cell Phone (Monday-Friday 8 am - 5 pm): [phone] On Call: [phone] and follow prompts after 5 pm and on weekends  Office Phone: [phone] Office Fax: [phone]

## 2017-07-17 ENCOUNTER — Encounter: Payer: Self-pay | Admitting: Family

## 2017-07-17 ENCOUNTER — Non-Acute Institutional Stay (SKILLED_NURSING_FACILITY): Payer: Medicare Other | Admitting: Family

## 2017-07-17 DIAGNOSIS — I130 Hypertensive heart and chronic kidney disease with heart failure and stage 1 through stage 4 chronic kidney disease, or unspecified chronic kidney disease: Secondary | ICD-10-CM | POA: Diagnosis not present

## 2017-07-17 DIAGNOSIS — G8929 Other chronic pain: Secondary | ICD-10-CM

## 2017-07-17 DIAGNOSIS — N183 Chronic kidney disease, stage 3 unspecified: Secondary | ICD-10-CM

## 2017-07-17 DIAGNOSIS — M25562 Pain in left knee: Secondary | ICD-10-CM | POA: Diagnosis not present

## 2017-07-17 DIAGNOSIS — I5042 Chronic combined systolic (congestive) and diastolic (congestive) heart failure: Secondary | ICD-10-CM

## 2017-07-17 DIAGNOSIS — M542 Cervicalgia: Secondary | ICD-10-CM | POA: Diagnosis not present

## 2017-07-17 DIAGNOSIS — M25512 Pain in left shoulder: Secondary | ICD-10-CM

## 2017-07-17 DIAGNOSIS — M25561 Pain in right knee: Secondary | ICD-10-CM | POA: Diagnosis not present

## 2017-07-17 NOTE — Progress Notes (Signed)
Location:  [place] Room Number: 16 Place of Service:  SNF [phone]) Provider: Seddrick Flax FNP-C  Blanchie Serve, MD  Patient Care Team: Blanchie Serve, MD as PCP - General (Internal Medicine) Clent Jacks, MD (Ophthalmology) Adrian Prows, MD as Attending Physician (Cardiology) [place], [facility] [name], MD as Consulting Physician (Orthopedic Surgery) Clance, Armando Reichert, MD as Consulting Physician (Pulmonary Disease) Fabrizio Filip, Nelda Bucks, NP as Nurse Practitioner [facility] Medicine)  Extended Emergency Contact Information Primary Emergency Contact: Pyper,[name] Address: [address]          [place], [place] [address] [name] of [place] Phone: [phone] Mobile Phone: [phone] Relation: Son Secondary Emergency Contact: [name] [place] of [place] Mobile Phone: [phone] Relation: Daughter  Code Status:  DNR Goals of care: Advanced Directive information Advanced Directives 07/17/2017  Does Patient Have a Medical Advance Directive? Yes  Type of Paramedic of [place];Out of facility DNR (pink MOST or yellow form);Living will  Does patient want to make changes to medical advance directive? -  Copy of [place] in Chart? Yes  Pre-existing out of facility DNR order (yellow form or pink MOST form) Yellow form placed in chart (order not valid for inpatient use)     Chief Complaint  Patient presents with  . Acute Visit    arthritis pain    HPI:  Pt is a 84 y.o. female seen today at [facility] for an acute visit for evaluation of left shoulder,neck,arm and knee pain.she is seen in her room today per facility DON request via SBAR.Nurse reports POA request evaluation of patient's pain during recent care plan meeting.Patient states no worsening of pain so long as Aspercreme is applied to the left shoulder.she states pain in the sides of neck worst with turning.she was recently seen by  MD and Tylenol increased to 1000 mg tablet twice daily.she states " I'm already taking too much tylenol".Lidocaine patch offered to apply on the neck but patient has declined for now she denies any fever or chills.she is currently on Doxycycline for recent pneumonia.    Past Medical History:  Diagnosis Date  . Abnormality of gait   . Anemia, unspecified   . Arthritis   . Bronchiectasis without acute exacerbation ([place]) 04/10/2011   CT chest 2011   . Cataract 1990   Dr. Katy Fitch  . Cholelithiases   . Closed fracture of unspecified part of ramus of mandible   . COPD (chronic obstructive pulmonary disease) ([place])   . Coronary atherosclerosis of unspecified type of vessel, native or graft   . Disturbance of skin sensation   . DOE (dyspnea on exertion) 04/10/2011   PFTs 2013:  FEV1 1.04 (78%), ratio 64, couldn't do maneuver for lung volumes or dlco   . Edema   . Esophageal reflux   . Fall from other slipping, tripping, or stumbling   . Gastric ulcer with hemorrhage 2008  . Hypertension   . Hypoxemia   . Insomnia, unspecified   . Mucopurulent chronic bronchitis ([place])   . Nodule of neck 11/07/2013   Left neck posteriorly   . Nontoxic uninodular goiter   . Osteoarthrosis, unspecified whether generalized or localized, unspecified site   . Other and unspecified hyperlipidemia   . Other malaise and fatigue   . Pain in joint, lower leg 2010   right knee  . Pain in joint, shoulder region 2013   left  . Palpitations   . Pneumonia, organism  unspecified(486)   . Regional enteritis of unspecified site   . Rosacea   . Sciatica   . Squamous cell carcinoma of skin of lower limb, including hip 07/27/2012   Nonhealing lesion of the left mid calf medially   . Tachycardia 12/06/2014  . Tension headache   . Tension headache   . Type II or unspecified type diabetes mellitus without mention of complication, not stated as uncontrolled   . Unspecified venous (peripheral) insufficiency   . Urine, incontinence,  stress female 07/19/2013   Past Surgical History:  Procedure Laterality Date  . EYE SURGERY Bilateral 1990   Dr. Katy Fitch  . GASTROJEJUNOSTOMY  05/2004   secondary to ulcer  . JOINT REPLACEMENT Bilateral 1993-1998   total knee replacement  . MOLE REMOVAL  05/11/14   left hand and left side of [facility]  . OTHER SURGICAL HISTORY  2008   Ulcer surgery   . SMALL INTESTINE SURGERY    . TOTAL KNEE ARTHROPLASTY Bilateral 1993, 1998   knees    Allergies  Allergen Reactions  . Adhesive [Tape]     unknown  . Aspirin     unknown  . Augmentin [Amoxicillin-Pot Clavulanate]   . Codeine     unknown  . Diclofenac   . Enalapril Cough  . Hydrocodone     unknown  . Pantoprazole Sodium     unknown  . Voltaren [Diclofenac Sodium]     Outpatient Encounter Medications as of 07/17/2017  Medication Sig  . acetaminophen (TYLENOL) 325 MG tablet Take 650 mg by mouth every 6 (six) hours as needed for moderate pain.   Marland Kitchen acetaminophen (TYLENOL) 500 MG tablet Take 1,000 mg by mouth at bedtime.   Marland Kitchen acetaminophen (TYLENOL) 500 MG tablet Take 500 mg by mouth daily.  Marland Kitchen doxycycline (DORYX) 100 MG EC tablet Take 100 mg by mouth 2 (two) times daily. Stop date 07/18/17  . Fluticasone-Salmeterol (ADVAIR) 100-50 MCG/DOSE AEPB Inhale 1 puff into the lungs 2 (two) times daily.  . furosemide (LASIX) 40 MG tablet Take 40 mg by mouth 2 (two) times daily. Hold for SBP <110  . guaiFENesin (MUCINEX) 600 MG 12 hr tablet Take 600 mg by mouth 2 (two) times daily.  Marland Kitchen guaifenesin (ROBITUSSIN) 100 MG/5ML syrup Take by mouth every 6 (six) hours as needed for cough (Give 10 mL).  . insulin glargine (LANTUS) 100 UNIT/ML injection Inject 26 Units into the skin at bedtime.   . insulin lispro (HUMALOG) 100 UNIT/ML cartridge Inject 12 Units into the skin. At 1230 PM and 530 PM. (If blood sugar is 150-250 = 8 units, if blood sugar is greater than 250 = 12 units daily)  . insulin lispro (HUMALOG) 100 UNIT/ML injection  Inject 12 Units into the skin 2 (two) times daily.  Marland Kitchen ipratropium-albuterol (DUONEB) 0.5-2.5 (3) MG/3ML SOLN Take 3 mLs by nebulization every 8 (eight) hours as needed.   Marland Kitchen ipratropium-albuterol (DUONEB) 0.5-2.5 (3) MG/3ML SOLN Take 3 mLs by nebulization 2 (two) times daily.  Marland Kitchen loperamide (IMODIUM A-D) 2 MG tablet Take 2 mg by mouth as needed for diarrhea or loose stools. Give 4 mg as the initial dose. Then give 2 mg after each loose stool  . loratadine (CLARITIN) 10 MG tablet Take 10 mg by mouth daily as needed for allergies.  Marland Kitchen losartan (COZAAR) 100 MG tablet Take 100 mg daily by mouth.  . metoprolol tartrate (LOPRESSOR) 50 MG tablet Take 50 mg by mouth 2 (two) times daily.  Marland Kitchen  mineral oil-hydrophilic petrolatum (AQUAPHOR) ointment Apply 1 application daily as needed topically for dry skin (to the left upper arm).  . Multiple Vitamins-Minerals (MULTIVITAMIN WITH MINERALS) tablet Take 1 tablet daily by mouth.  Marland Kitchen omeprazole (PRILOSEC) 20 MG capsule Take 20 mg by mouth daily.   . OXYGEN Inhale 1.5 L/min into the lungs.   . predniSONE (DELTASONE) 5 MG tablet Take 5 mg by mouth daily with breakfast.  . saccharomyces boulardii (FLORASTOR) 250 MG capsule Take 250 mg by mouth 2 (two) times daily. Stop date 07/18/17  . spironolactone (ALDACTONE) 25 MG tablet Take 25 mg by mouth daily.   . traZODone (DESYREL) 50 MG tablet Take 50 mg by mouth at bedtime.  . trolamine salicylate (ASPERCREME) 10 % cream Apply 1 application topically 3 (three) times daily. Both knees   No facility-administered encounter medications on file as of 07/17/2017.     Review of Systems  Constitutional: Negative for appetite change, chills, fatigue and fever.  HENT: Positive for hearing loss. Negative for congestion, rhinorrhea, sinus pressure, sinus pain, sneezing and sore throat.   Eyes: Positive for visual disturbance. Negative for discharge and redness.       Wears eye glasses  Respiratory: Negative for chest tightness and  wheezing.        Shortness of breath with exertion.chronic cough wears oxygen via nasal cannula   Cardiovascular: Negative for chest pain, palpitations and leg swelling.  Gastrointestinal: Negative for abdominal distention, abdominal pain, constipation, nausea and vomiting.  Musculoskeletal: Positive for arthralgias, gait problem and neck pain. Negative for joint swelling and neck stiffness.  Skin: Negative for color change, pallor and rash.  Neurological: Negative for dizziness, light-headedness and headaches.  Psychiatric/Behavioral: Negative for agitation, confusion and sleep disturbance. The patient is not nervous/anxious.     Immunization History  Administered Date(s) Administered  . Influenza Split 11/06/2011, 11/04/2012  . Influenza,inj,Quad PF,6+ Mos 11/17/2013  . Influenza-Unspecified 11/09/2014, 11/16/2015, 11/25/2015, 11/13/2016  . PPD Test 06/19/2010, 11/29/2014  . Pneumococcal Conjugate-13 08/11/2014  . Pneumococcal Polysaccharide-23 02/04/2003  . Pneumococcal-Unspecified 06/07/2014  . Tdap 02/27/2013  . Zoster 02/03/2006   Pertinent  Health Maintenance Due  Topic Date Due  . FOOT EXAM  02/07/2017  . DEXA SCAN  07/18/2026 (Originally 10/31/1984)  . OPHTHALMOLOGY EXAM  07/22/2017  . INFLUENZA VACCINE  09/03/2017  . HEMOGLOBIN A1C  10/25/2017  . PNA vac Low Risk Adult  Completed   Fall Risk  02/02/2017 12/06/2014 07/11/2014 05/22/2014 11/07/2013  Falls in the past year? - Yes No Yes No  Number falls in past yr: - 1 - 1 -  Injury with Fall? - No - No -  Risk for fall due to : History of fall(s);Impaired mobility History of fall(s);Impaired mobility - History of fall(s);Impaired balance/gait -  Follow up - - - Falls evaluation completed -    Vitals:   07/17/17 1100  BP: (!) 163/75  Pulse: 68  Resp: 20  Temp: 98.2 F (36.8 C)  SpO2: 95%  Weight: 215 lb (97.5 kg)  Height: 5\' 7"  (1.702 m)   Body mass index is 33.67 kg/m. Physical Exam  Constitutional: She is  oriented to person, place, and time.  Obese elderly in no acute distress   HENT:  Head: Normocephalic.  Mouth/Throat: Oropharynx is clear and moist. No oropharyngeal exudate.  Hearing aid in place left ear  Eyes: Pupils are equal, round, and reactive to light. Conjunctivae and EOM are normal. Right eye exhibits no discharge. Left eye exhibits no discharge.  No scleral icterus.  Neck: Normal range of motion. No JVD present. No thyromegaly present.  Cardiovascular: Intact distal pulses. Exam reveals no gallop and no friction rub.  Murmur heard. Pulmonary/Chest: Effort normal and breath sounds normal. No stridor. No respiratory distress. She has no wheezes. She has no rales.  Oxygen 2 liters via nasal cannula   Abdominal: Soft. Bowel sounds are normal. She exhibits no distension and no mass. There is no tenderness. There is no rebound and no guarding.  Musculoskeletal: She exhibits edema. She exhibits no tenderness.  Moves x 4 extremities except Limited ROM to left shoulder and neck due to pain.unsteady gait uses wheelchair and scooter.  Lymphadenopathy:    She has no cervical adenopathy.  Neurological: She is oriented to person, place, and time. Gait abnormal.  Skin: Skin is warm and dry. No rash noted. No erythema. No pallor.  Psychiatric: She has a normal mood and affect. Her speech is normal and behavior is normal. Thought content normal.  Nursing note and vitals reviewed.   Labs reviewed: Recent Labs    01/08/17 04/24/17 06/18/17  NA 137  137 140 138  138  K 3.8  3.8 4.7 4.6  4.6  CL 100 100 97  CO2 30 36 34  BUN 46* 47* 41*  41*  CREATININE 1.0  1.03 1.2* 1.1  1.09  CALCIUM 9.0  9.0 9.0 9.1  9.1   Recent Labs    08/18/16 04/24/17  AST 20 15  ALT 17 11  ALKPHOS 80 106  PROT  --  5.3  ALBUMIN  --  3.2   Recent Labs    12/08/16 04/24/17 06/18/17  WBC 9.1  9.1 7.9 8.5  8.5  HGB 11.4*  11.4 10.0* 10.9*  10.9  HCT 34*  33.8 30* 32*  32.2  PLT 235 186 216    Lab Results  Component Value Date   TSH 2.61 07/10/2016   Lab Results  Component Value Date   HGBA1C 6.9 04/24/2017   Lab Results  Component Value Date   CHOL 117 12/15/2016   HDL 52 12/15/2016   LDLCALC 40 12/15/2016   TRIG 186 (A) 12/15/2016    Significant Diagnostic Results in last 30 days:  No results found.  Assessment/Plan 1. Pain in joint of left shoulder Limited ROM with abduction which is chronic.Extra strength tylenol recent increased to 1000 mg tablet twice daily.Recommended adding 500 mg tablet at 2 pm and PT/OT for pain but patient decline.continue on Extra strength Tylenol 1000 mg tablet twice daily and Aspercreme three times daily.continue to monitor.  2. Neck pain Limited ROM with rotation from side to side and extension.No change in treatment desired by patient as above.continue current pain regimen.  3. Chronic pain of both knees Continue on current regimen per patient's request.continue fall and safety precautions.  4. Hypertensive heart and Kidney disease with CHF SBP > 160.asymptomatic.continue on losartan 100 mg tablet daily,furosemide 40 mg tablet twice daily and aldactone 25 mg tablet daily.increase Metoprolol tartrate to 75 mg tablet twice daily hold for SBP < 110 or HR <60 b/min. Continue to monitor.    Family/ staff Communication: Reviewed plan of care with patient and facility Nurse.   Labs/tests ordered: None   Skyeler Scalese C Dalvin Clipper, NP

## 2017-07-27 DIAGNOSIS — R531 Weakness: Secondary | ICD-10-CM | POA: Diagnosis not present

## 2017-07-27 DIAGNOSIS — E1151 Type 2 diabetes mellitus with diabetic peripheral angiopathy without gangrene: Secondary | ICD-10-CM | POA: Diagnosis not present

## 2017-07-27 LAB — HEMOGLOBIN A1C: HEMOGLOBIN A1C: 7.2

## 2017-08-05 ENCOUNTER — Non-Acute Institutional Stay (SKILLED_NURSING_FACILITY): Payer: Medicare Other | Admitting: Internal Medicine

## 2017-08-05 ENCOUNTER — Encounter: Payer: Self-pay | Admitting: Internal Medicine

## 2017-08-05 DIAGNOSIS — Z598 Other problems related to housing and economic circumstances: Secondary | ICD-10-CM | POA: Diagnosis not present

## 2017-08-05 DIAGNOSIS — Z5989 Other problems related to housing and economic circumstances: Secondary | ICD-10-CM

## 2017-08-05 DIAGNOSIS — E1151 Type 2 diabetes mellitus with diabetic peripheral angiopathy without gangrene: Secondary | ICD-10-CM | POA: Insufficient documentation

## 2017-08-05 DIAGNOSIS — S81802A Unspecified open wound, left lower leg, initial encounter: Secondary | ICD-10-CM

## 2017-08-05 NOTE — Progress Notes (Signed)
Location:  [facility] Room Number: 16 Place of Service:  SNF ([phone]) Provider:  Blanchie Serve, MD  Blanchie Serve, MD  Patient Care Team: Blanchie Serve, MD as PCP - General (Internal Medicine) Clent Jacks, MD (Ophthalmology) Adrian Prows, MD as Attending Physician (Cardiology) [place], [facility] [name], MD as Consulting Physician (Orthopedic Surgery) Clance, Armando Reichert, MD as Consulting Physician (Pulmonary Disease) Ngetich, Nelda Bucks, NP as Nurse Practitioner [facility])  Extended Emergency Contact Information Primary Emergency Contact: Millan,[name] Address: [address]          [place], [place] [address] [name] of [place] Phone: [phone] Mobile Phone: [phone] Relation: Son Secondary Emergency Contact: [name] [place] of [place] Mobile Phone: [phone] Relation: Daughter  Code Status:  DNR  Goals of care: Advanced Directive information Advanced Directives 08/05/2017  Does Patient Have a Medical Advance Directive? Yes  Type of Paramedic of [place];Living will;Out of facility DNR (pink MOST or yellow form)  Does patient want to make changes to medical advance directive? No - Patient declined  Copy of [place] in Chart? Yes  Pre-existing out of facility DNR order (yellow form or pink MOST form) Yellow form placed in chart (order not valid for inpatient use)     Chief Complaint  Patient presents with  . Acute Visit    Raised area to left leg, medication change per inusrance.     HPI:  Pt is a 84 y.o. female seen today for an acute visit for new lesion to left leg. She noted it yesterday. It hurts at times especially to touch. She does not remember hitting her leg against anything. She has history of diabetes with decreased circulation. Pt received note from her insurance company that they would not cover for her lispro after 30 days. She is concerned about this.     Past Medical History:  Diagnosis Date  . Abnormality of gait   . Anemia, unspecified   . Arthritis   . Bronchiectasis without acute exacerbation ([place]) 04/10/2011   CT chest 2011   . Cataract 1990   Dr. Katy Fitch  . Cholelithiases   . Closed fracture of unspecified part of ramus of mandible   . COPD (chronic obstructive pulmonary disease) ([place])   . Coronary atherosclerosis of unspecified type of vessel, native or graft   . Disturbance of skin sensation   . DOE (dyspnea on exertion) 04/10/2011   PFTs 2013:  FEV1 1.04 (78%), ratio 64, couldn't do maneuver for lung volumes or dlco   . Edema   . Esophageal reflux   . Fall from other slipping, tripping, or stumbling   . Gastric ulcer with hemorrhage 2008  . Hypertension   . Hypoxemia   . Insomnia, unspecified   . Mucopurulent chronic bronchitis ([place])   . Nodule of neck 11/07/2013   Left neck posteriorly   . Nontoxic uninodular goiter   . Osteoarthrosis, unspecified whether generalized or localized, unspecified site   . Other and unspecified hyperlipidemia   . Other malaise and fatigue   . Pain in joint, lower leg 2010   right knee  . Pain in joint, shoulder region 2013   left  . Palpitations   . Pneumonia, organism unspecified(486)   . Regional enteritis of unspecified site   . Rosacea   . Sciatica   . Squamous cell carcinoma of skin of lower limb, including hip 07/27/2012   Nonhealing lesion of the left mid  calf medially   . Tachycardia 12/06/2014  . Tension headache   . Tension headache   . Type II or unspecified type diabetes mellitus without mention of complication, not stated as uncontrolled   . Unspecified venous (peripheral) insufficiency   . Urine, incontinence, stress female 07/19/2013   Past Surgical History:  Procedure Laterality Date  . EYE SURGERY Bilateral 1990   Dr. Katy Fitch  . GASTROJEJUNOSTOMY  05/2004   secondary to ulcer  . JOINT REPLACEMENT Bilateral 1993-1998   total knee replacement  . MOLE REMOVAL   05/11/14   left hand and left side of face [facility]  . OTHER SURGICAL HISTORY  2008   Ulcer surgery   . SMALL INTESTINE SURGERY    . TOTAL KNEE ARTHROPLASTY Bilateral 1993, 1998   knees    Allergies  Allergen Reactions  . Adhesive [Tape]     unknown  . Aspirin     unknown  . Augmentin [Amoxicillin-Pot Clavulanate]   . Codeine     unknown  . Diclofenac   . Enalapril Cough  . Hydrocodone     unknown  . Pantoprazole Sodium     unknown  . Voltaren [Diclofenac Sodium]     Outpatient Encounter Medications as of 08/05/2017  Medication Sig  . acetaminophen (TYLENOL) 325 MG tablet Take 650 mg by mouth every 6 (six) hours as needed for moderate pain.   Marland Kitchen acetaminophen (TYLENOL) 500 MG tablet Take 1,000 mg by mouth 2 (two) times daily.   . Fluticasone-Salmeterol (ADVAIR) 100-50 MCG/DOSE AEPB Inhale 1 puff into the lungs 2 (two) times daily.  . furosemide (LASIX) 40 MG tablet Take 40 mg by mouth 2 (two) times daily. Hold for SBP <110  . guaiFENesin (MUCINEX) 600 MG 12 hr tablet Take 600 mg by mouth 2 (two) times daily.  Marland Kitchen guaifenesin (ROBITUSSIN) 100 MG/5ML syrup Take by mouth every 6 (six) hours as needed for cough (Give 10 mL).  . insulin glargine (LANTUS) 100 UNIT/ML injection Inject 26 Units into the skin at bedtime.   . insulin lispro (HUMALOG) 100 UNIT/ML cartridge Inject 12 Units into the skin. At 1230 PM and 530 PM. (If blood sugar is 150-250 = 8 units, if blood sugar is greater than 250 = 12 units daily)  . insulin lispro (HUMALOG) 100 UNIT/ML injection Inject 12 Units into the skin 2 (two) times daily.  Marland Kitchen ipratropium-albuterol (DUONEB) 0.5-2.5 (3) MG/3ML SOLN Take 3 mLs by nebulization every 8 (eight) hours as needed.   Marland Kitchen ipratropium-albuterol (DUONEB) 0.5-2.5 (3) MG/3ML SOLN Take 3 mLs by nebulization 2 (two) times daily.  Marland Kitchen losartan (COZAAR) 100 MG tablet Take 100 mg daily by mouth.  . metoprolol tartrate (LOPRESSOR) 50 MG tablet Take 75 mg by mouth 2 (two) times  daily.   . mineral oil-hydrophilic petrolatum (AQUAPHOR) ointment Apply 1 application daily as needed topically for dry skin (to the left upper arm).  . Multiple Vitamins-Minerals (MULTIVITAMIN WITH MINERALS) tablet Take 1 tablet daily by mouth.  Marland Kitchen omeprazole (PRILOSEC) 20 MG capsule Take 20 mg by mouth daily.   . OXYGEN Inhale 1.5 L/min into the lungs.   . predniSONE (DELTASONE) 5 MG tablet Take 5 mg by mouth daily with breakfast.  . spironolactone (ALDACTONE) 25 MG tablet Take 25 mg by mouth daily.   . traZODone (DESYREL) 50 MG tablet Take 50 mg by mouth at bedtime.  . trolamine salicylate (ASPERCREME) 10 % cream Apply 1 application topically 3 (three) times daily. Both knees  . [  DISCONTINUED] acetaminophen (TYLENOL) 500 MG tablet Take 500 mg by mouth daily.  . [DISCONTINUED] doxycycline (DORYX) 100 MG EC tablet Take 100 mg by mouth 2 (two) times daily. Stop date 07/18/17  . [DISCONTINUED] loperamide (IMODIUM A-D) 2 MG tablet Take 2 mg by mouth as needed for diarrhea or loose stools. Give 4 mg as the initial dose. Then give 2 mg after each loose stool  . [DISCONTINUED] loratadine (CLARITIN) 10 MG tablet Take 10 mg by mouth daily as needed for allergies.  . [DISCONTINUED] saccharomyces boulardii (FLORASTOR) 250 MG capsule Take 250 mg by mouth 2 (two) times daily. Stop date 07/18/17   No facility-administered encounter medications on file as of 08/05/2017.     Review of Systems  Constitutional: Positive for fatigue. Negative for chills and fever.       Chronic fatigue  HENT: Negative for congestion.   Respiratory: Positive for shortness of breath.   Cardiovascular: Negative for chest pain.  Gastrointestinal: Negative for abdominal pain, nausea and vomiting.  Musculoskeletal: Positive for gait problem.  Skin: Positive for wound.  Neurological: Positive for weakness. Negative for dizziness and headaches.  Hematological: Bruises/bleeds easily.  Psychiatric/Behavioral: Negative for behavioral  problems.    Immunization History  Administered Date(s) Administered  . Influenza Split 11/06/2011, 11/04/2012  . Influenza,inj,Quad PF,6+ Mos 11/17/2013  . Influenza-Unspecified 11/09/2014, 11/16/2015, 11/25/2015, 11/13/2016  . PPD Test 06/19/2010, 11/29/2014  . Pneumococcal Conjugate-13 08/11/2014  . Pneumococcal Polysaccharide-23 02/04/2003  . Pneumococcal-Unspecified 06/07/2014  . Tdap 02/27/2013  . Zoster 02/03/2006   Pertinent  Health Maintenance Due  Topic Date Due  . FOOT EXAM  02/07/2017  . OPHTHALMOLOGY EXAM  07/22/2017  . DEXA SCAN  07/18/2026 (Originally 10/31/1984)  . INFLUENZA VACCINE  09/03/2017  . HEMOGLOBIN A1C  01/26/2018  . PNA vac Low Risk Adult  Completed   Fall Risk  02/02/2017 12/06/2014 07/11/2014 05/22/2014 11/07/2013  Falls in the past year? - Yes No Yes No  Number falls in past yr: - 1 - 1 -  Injury with Fall? - No - No -  Risk for fall due to : History of fall(s);Impaired mobility History of fall(s);Impaired mobility - History of fall(s);Impaired balance/gait -  Follow up - - - Falls evaluation completed -   Functional Status Survey:    Vitals:   08/05/17 1130  BP: (!) 153/79  Pulse: 73  Resp: 20  Temp: (!) 97.1 F (36.2 C)  TempSrc: Oral  SpO2: 98%  Weight: 216 lb (98 kg)  Height: 5\' 7"  (1.702 m)   Body mass index is 33.83 kg/m. Physical Exam  Constitutional: She is oriented to person, place, and time.  Obese elderly female in no acute distress  HENT:  Head: Normocephalic and atraumatic.  Eyes: Conjunctivae are normal.  Neck: Normal range of motion. Neck supple.  Cardiovascular: Normal rate and regular rhythm.  Pulmonary/Chest:  Poor air movement to lung bases, no wheeze or rhonci  Abdominal: Soft. Bowel sounds are normal.  Musculoskeletal: She exhibits edema.  Trace bilateral leg edema, chronic skin changes with hemosiderosis to both legs, unsteady gait, walker and wheelchair for ambulation  Neurological: She is alert and oriented  to person, place, and time.  Skin: Skin is warm and dry. Capillary refill takes less than 2 seconds. There is erythema.  Open skin area to left leg with yellow discoloration of upper skin area with small opening, mild periwound erythema, no drainage, tender to touch, normal temperature  Psychiatric: She has a normal mood and affect.  Labs reviewed: Recent Labs    01/08/17 04/24/17 06/18/17  NA 137  137 140 138  138  K 3.8  3.8 4.7 4.6  4.6  CL 100 100 97  CO2 30 36 34  BUN 46* 47* 41*  41*  CREATININE 1.0  1.03 1.2* 1.1  1.09  CALCIUM 9.0  9.0 9.0 9.1  9.1   Recent Labs    08/18/16 04/24/17  AST 20 15  ALT 17 11  ALKPHOS 80 106  PROT  --  5.3  ALBUMIN  --  3.2   Recent Labs    12/08/16 04/24/17 06/18/17  WBC 9.1  9.1 7.9 8.5  8.5  HGB 11.4*  11.4 10.0* 10.9*  10.9  HCT 34*  33.8 30* 32*  32.2  PLT 235 186 216   Lab Results  Component Value Date   TSH 2.61 07/10/2016   Lab Results  Component Value Date   HGBA1C 7.2 07/27/2017   Lab Results  Component Value Date   CHOL 117 12/15/2016   HDL 52 12/15/2016   LDLCALC 40 12/15/2016   TRIG 186 (A) 12/15/2016    Significant Diagnostic Results in last 30 days:  No results found.  Assessment/Plan  1. Type 2 diabetes with decreased circulation (HCC) Lab Results  Component Value Date   HGBA1C 7.2 07/27/2017   Continue current regimen of lantus with lispro. Nursing to clarify with pharmacy on other alternatives for lispro that would be covered by [employer] to make changes accordingly. Pt assured about this.   2. Open wound of left lower leg, initial encounter No clinical signs of infection noted, hold off on initiating systemic antibiotic for now. Cleanse area with NS, apply bacitracin ointment and dressing daily and as needed. Monitor for signs of infection. Reassess if no improvement with her history of DM and decreased circulation   3. Insurance coverage problems With [employer] and coverage for lispro,  alternative option being looked into to make necessary changes.     Family/ staff Communication: reviewed care plan with patient and charge nurse.    Labs/tests ordered:  none  Blanchie Serve, MD Internal Medicine [facility] [address] [place], [place] [address] Cell Phone (Monday-Friday 8 am - 5 pm): [phone] On Call: [phone] and follow prompts after 5 pm and on weekends Office Phone: [phone] Office Fax: [phone]

## 2017-08-13 ENCOUNTER — Encounter: Payer: Self-pay | Admitting: Family

## 2017-08-13 ENCOUNTER — Non-Acute Institutional Stay (SKILLED_NURSING_FACILITY): Payer: Medicare Other | Admitting: Family

## 2017-08-13 DIAGNOSIS — I130 Hypertensive heart and chronic kidney disease with heart failure and stage 1 through stage 4 chronic kidney disease, or unspecified chronic kidney disease: Secondary | ICD-10-CM | POA: Diagnosis not present

## 2017-08-13 DIAGNOSIS — N183 Chronic kidney disease, stage 3 (moderate): Secondary | ICD-10-CM | POA: Diagnosis not present

## 2017-08-13 DIAGNOSIS — S81802D Unspecified open wound, left lower leg, subsequent encounter: Secondary | ICD-10-CM

## 2017-08-13 DIAGNOSIS — E1122 Type 2 diabetes mellitus with diabetic chronic kidney disease: Secondary | ICD-10-CM

## 2017-08-13 DIAGNOSIS — I5042 Chronic combined systolic (congestive) and diastolic (congestive) heart failure: Secondary | ICD-10-CM

## 2017-08-13 DIAGNOSIS — G4700 Insomnia, unspecified: Secondary | ICD-10-CM

## 2017-08-13 DIAGNOSIS — Z794 Long term (current) use of insulin: Secondary | ICD-10-CM

## 2017-08-13 NOTE — Progress Notes (Signed)
Location:  [facility] Room Number: 16 Place of Service:  SNF [phone]) Provider: Tudor Chandley FNP-C   Blanchie Serve, MD  Patient Care Team: Blanchie Serve, MD as PCP - General (Internal Medicine) Clent Jacks, MD (Ophthalmology) Adrian Prows, MD as Attending Physician (Cardiology) Gilman, [facility] [name], MD as Consulting Physician (Orthopedic Surgery) Clance, Armando Reichert, MD as Consulting Physician (Pulmonary Disease) Lennan Malone, Nelda Bucks, NP as Nurse Practitioner [facility])  Extended Emergency Contact Information Primary Emergency Contact: Maser,[name] Address: [address] [place]          [place], [place] [address] [name] of [place] Phone: [phone] Mobile Phone: [phone] Relation: Son Secondary Emergency Contact: [name] [place] Mobile Phone: [phone] Relation: Daughter  Code Status:  DNR  Goals of care: Advanced Directive information Advanced Directives 08/13/2017  Does Patient Have a Medical Advance Directive? Yes  Type of Paramedic of [place];Out of facility DNR (pink MOST or yellow form);Living will  Does patient want to make changes to medical advance directive? -  Copy of [place] in Chart? Yes  Pre-existing out of facility DNR order (yellow form or pink MOST form) Yellow form placed in chart (order not valid for inpatient use)     Chief Complaint  Patient presents with  . Medical Management of Chronic Issues    HPI:  Pt is a 84 y.o. female seen today [facility] for medical management of chronic diseases.she has a medical history of HTN,CHF,COPD,chronic respiratory failure with hypoxia on continuous oxygen supplement,CKD stage 3, insomnia among other conditions.she is seen in her room today.she states recent  Open wound area dressing changed by Nurse and seems to be improving.she denies any fever or chills.she has had no recent acute illness or  hospital visit.No fall episode or weight changes since prior visit.   Past Medical History:  Diagnosis Date  . Abnormality of gait   . Anemia, unspecified   . Arthritis   . Bronchiectasis without acute exacerbation ([place]) 04/10/2011   CT chest 2011   . Cataract 1990   Dr. Katy Fitch  . Cholelithiases   . Closed fracture of unspecified part of ramus of mandible   . COPD (chronic obstructive pulmonary disease) ([place])   . Coronary atherosclerosis of unspecified type of vessel, native or graft   . Disturbance of skin sensation   . DOE (dyspnea on exertion) 04/10/2011   PFTs 2013:  FEV1 1.04 (78%), ratio 64, couldn't do maneuver for lung volumes or dlco   . Edema   . Esophageal reflux   . Fall from other slipping, tripping, or stumbling   . Gastric ulcer with hemorrhage 2008  . Hypertension   . Hypoxemia   . Insomnia, unspecified   . Mucopurulent chronic bronchitis ([place])   . Nodule of neck 11/07/2013   Left neck posteriorly   . Nontoxic uninodular goiter   . Osteoarthrosis, unspecified whether generalized or localized, unspecified site   . Other and unspecified hyperlipidemia   . Other malaise and fatigue   . Pain in joint, lower leg 2010   right knee  . Pain in joint, shoulder region 2013   left  . Palpitations   . Pneumonia, organism unspecified(486)   . Regional enteritis of unspecified site   . Rosacea   . Sciatica   . Squamous cell carcinoma of skin of lower limb, including hip 07/27/2012   Nonhealing lesion of the left mid calf medially   .  Tachycardia 12/06/2014  . Tension headache   . Tension headache   . Type II or unspecified type diabetes mellitus without mention of complication, not stated as uncontrolled   . Unspecified venous (peripheral) insufficiency   . Urine, incontinence, stress female 07/19/2013   Past Surgical History:  Procedure Laterality Date  . EYE SURGERY Bilateral 1990   Dr. Katy Fitch  . GASTROJEJUNOSTOMY  05/2004   secondary to ulcer  . JOINT REPLACEMENT  Bilateral 1993-1998   total knee replacement  . MOLE REMOVAL  05/11/14   left hand and left side of face [facility]  . OTHER SURGICAL HISTORY  2008   Ulcer surgery   . SMALL INTESTINE SURGERY    . TOTAL KNEE ARTHROPLASTY Bilateral 1993, 1998   knees    Allergies  Allergen Reactions  . Adhesive [Tape]     unknown  . Aspirin     unknown  . Augmentin [Amoxicillin-Pot Clavulanate]   . Codeine     unknown  . Diclofenac   . Enalapril Cough  . Hydrocodone     unknown  . Pantoprazole Sodium     unknown  . Voltaren [Diclofenac Sodium]     Allergies as of 08/13/2017      Reactions   Adhesive [tape]    unknown   Aspirin    unknown   Augmentin [amoxicillin-pot Clavulanate]    Codeine    unknown   Diclofenac    Enalapril Cough   Hydrocodone    unknown   Pantoprazole Sodium    unknown   Voltaren [diclofenac Sodium]       Medication List        Accurate as of 08/13/17  2:14 PM. Always use your most recent med list.          acetaminophen 500 MG tablet Commonly known as:  TYLENOL Take 1,000 mg by mouth 2 (two) times daily.   acetaminophen 325 MG tablet Commonly known as:  TYLENOL Take 650 mg by mouth every 6 (six) hours as needed for moderate pain.   Fluticasone-Salmeterol 100-50 MCG/DOSE Aepb Commonly known as:  ADVAIR Inhale 1 puff into the lungs 2 (two) times daily.   furosemide 40 MG tablet Commonly known as:  LASIX Take 40 mg by mouth 2 (two) times daily. Hold for SBP <110   guaiFENesin 600 MG 12 hr tablet Commonly known as:  MUCINEX Take 600 mg by mouth 2 (two) times daily.   guaifenesin 100 MG/5ML syrup Commonly known as:  ROBITUSSIN Take by mouth every 6 (six) hours as needed for cough (Give 10 mL).   HUMALOG 100 UNIT/ML cartridge Generic drug:  insulin lispro Inject 12 Units into the skin. At 1230 PM and 530 PM. (If blood sugar is 150-250 = 12 units, if blood sugar is greater than 250 = 15 units daily)   insulin glargine 100 UNIT/ML  injection Commonly known as:  LANTUS Inject 26 Units into the skin at bedtime.   ipratropium-albuterol 0.5-2.5 (3) MG/3ML Soln Commonly known as:  DUONEB Take 3 mLs by nebulization every 8 (eight) hours as needed.   ipratropium-albuterol 0.5-2.5 (3) MG/3ML Soln Commonly known as:  DUONEB Take 3 mLs by nebulization 2 (two) times daily.   losartan 100 MG tablet Commonly known as:  COZAAR Take 100 mg daily by mouth.   metoprolol tartrate 50 MG tablet Commonly known as:  LOPRESSOR Take 75 mg by mouth 2 (two) times daily.   mineral oil-hydrophilic petrolatum ointment Apply 1 application daily as  needed topically for dry skin (to the left upper arm).   multivitamin with minerals tablet Take 1 tablet daily by mouth.   omeprazole 20 MG capsule Commonly known as:  PRILOSEC Take 20 mg by mouth daily.   OXYGEN Inhale 1.5 L/min into the lungs.   predniSONE 5 MG tablet Commonly known as:  DELTASONE Take 5 mg by mouth daily with breakfast.   spironolactone 25 MG tablet Commonly known as:  ALDACTONE Take 25 mg by mouth daily.   traZODone 50 MG tablet Commonly known as:  DESYREL Take 50 mg by mouth at bedtime.   trolamine salicylate 10 % cream Commonly known as:  ASPERCREME Apply 1 application topically 3 (three) times daily. Both knees       Review of Systems  Constitutional: Negative for appetite change, chills, fatigue, fever and unexpected weight change.  HENT: Positive for hearing loss. Negative for congestion, rhinorrhea, sinus pressure, sneezing, sore throat and trouble swallowing.        Wears hearing aid on the left ear   Eyes: Negative for discharge, redness, itching and visual disturbance.       Wears eye glasses   Respiratory: Negative for cough, chest tightness, shortness of breath and wheezing.   Cardiovascular: Positive for leg swelling. Negative for chest pain and palpitations.  Gastrointestinal: Negative for abdominal distention, abdominal pain,  constipation, diarrhea, nausea and vomiting.  Endocrine: Negative for cold intolerance, heat intolerance, polydipsia, polyphagia and polyuria.  Genitourinary: Negative for dysuria, flank pain and frequency.       Voids several times during the night   Musculoskeletal: Positive for arthralgias, gait problem and neck pain. Negative for neck stiffness.  Skin: Negative for color change, pallor, rash and wound.  Neurological: Negative for dizziness, light-headedness and headaches.  Psychiatric/Behavioral: Negative for agitation, confusion and sleep disturbance. The patient is not nervous/anxious.     Immunization History  Administered Date(s) Administered  . Influenza Split 11/06/2011, 11/04/2012  . Influenza,inj,Quad PF,6+ Mos 11/17/2013  . Influenza-Unspecified 11/09/2014, 11/16/2015, 11/25/2015, 11/13/2016  . PPD Test 06/19/2010, 11/29/2014  . Pneumococcal Conjugate-13 08/11/2014  . Pneumococcal Polysaccharide-23 02/04/2003  . Pneumococcal-Unspecified 06/07/2014  . Tdap 02/27/2013  . Zoster 02/03/2006   Pertinent  Health Maintenance Due  Topic Date Due  . OPHTHALMOLOGY EXAM  07/22/2017  . DEXA SCAN  07/18/2026 (Originally 10/31/1984)  . INFLUENZA VACCINE  09/03/2017  . HEMOGLOBIN A1C  01/26/2018  . FOOT EXAM  06/06/2018  . PNA vac Low Risk Adult  Completed   Fall Risk  02/02/2017 12/06/2014 07/11/2014 05/22/2014 11/07/2013  Falls in the past year? - Yes No Yes No  Number falls in past yr: - 1 - 1 -  Injury with Fall? - No - No -  Risk for fall due to : History of fall(s);Impaired mobility History of fall(s);Impaired mobility - History of fall(s);Impaired balance/gait -  Follow up - - - Falls evaluation completed -   Functional Status Survey:    Vitals:   08/13/17 1210  BP: (!) 166/80  Pulse: 70  Resp: 20  Temp: (!) 97.4 F (36.3 C)  SpO2: 95%  Weight: 216 lb (98 kg)  Height: 5\' 7"  (1.702 m)   Body mass index is 33.83 kg/m. Physical Exam  Constitutional: She is oriented  to person, place, and time.  Obese, elderly in no acute distress  HENT:  Head: Normocephalic.  Right Ear: External ear normal.  Left Ear: External ear normal.  Mouth/Throat: Oropharynx is clear and moist. No oropharyngeal exudate.  Left ear hearing aid  Eyes: Pupils are equal, round, and reactive to light. Conjunctivae and EOM are normal. Right eye exhibits no discharge. Left eye exhibits no discharge. No scleral icterus.  Eye glasses in place  Neck: Neck supple. No JVD present.  Cardiovascular: Normal rate, regular rhythm, normal heart sounds and intact distal pulses. Exam reveals no gallop and no friction rub.  No murmur heard. Pulmonary/Chest: Effort normal and breath sounds normal. No respiratory distress. She has no wheezes. She has no rales. She exhibits no tenderness.  Oxygen via nasal cannula   Abdominal: Soft. Bowel sounds are normal. She exhibits no distension and no mass. There is no tenderness. There is no rebound and no guarding.  Musculoskeletal: She exhibits no tenderness.  Unsteady gait uses scooter for long distance and self propels on wheelchair.Moves x 4 extremities except left shoulder limited ROM due to arthritis.Arthritic changes to fingers noted.  Bilateral lower extremities trace -1+ edema.Knee high ted hose in place.   Lymphadenopathy:    She has no cervical adenopathy.  Neurological: She is oriented to person, place, and time. Gait abnormal.  HOH wears left hearing aid   Skin: Skin is warm and dry. No rash noted. No erythema. No pallor.  Left lateral leg shallow open wound without any drainage or signs of infection.self adhesive Telfa gauze dry,clean and intact.surrounding skin tissue without any signs of infections.   Psychiatric: She has a normal mood and affect. Her speech is normal and behavior is normal. Judgment and thought content normal.  Nursing note and vitals reviewed.  Labs reviewed: Recent Labs    01/08/17 04/24/17 06/18/17  NA 137  137 140 138   138  K 3.8  3.8 4.7 4.6  4.6  CL 100 100 97  CO2 30 36 34  BUN 46* 47* 41*  41*  CREATININE 1.0  1.03 1.2* 1.1  1.09  CALCIUM 9.0  9.0 9.0 9.1  9.1   Recent Labs    08/18/16 04/24/17  AST 20 15  ALT 17 11  ALKPHOS 80 106  PROT  --  5.3  ALBUMIN  --  3.2   Recent Labs    12/08/16 04/24/17 06/18/17  WBC 9.1  9.1 7.9 8.5  8.5  HGB 11.4*  11.4 10.0* 10.9*  10.9  HCT 34*  33.8 30* 32*  32.2  PLT 235 186 216   Lab Results  Component Value Date   TSH 2.61 07/10/2016   Lab Results  Component Value Date   HGBA1C 7.2 07/27/2017   Lab Results  Component Value Date   CHOL 117 12/15/2016   HDL 52 12/15/2016   LDLCALC 40 12/15/2016   TRIG 186 (A) 12/15/2016    Significant Diagnostic Results in last 30 days:  No results found.  Assessment/Plan 1. Controlled type 2 diabetes mellitus with stage 3 chronic kidney disease, with long-term current use of insulin  Lab Results  Component Value Date   HGBA1C 7.2 07/27/2017  CBG readings reviewed ranging in the 90's-160's.No signs of hypoglycemia.continue on Humalog 12 units for CBG 150-250 and 15 units for CBG >250 three times with meals.lantus 26 units SQ daily at bedtime.on BB and ARB but off ASA due to high risk for falls.Also off statin due to advance age and high risk for weakness.Seen by Podiatrist 06/05/2017 and Ophthalmology 07/22/2017.continue to monitor Hgb A1C.   2. Hypertensive heart and kidney disease with chronic combined systolic and diastolic congestive heart failure and stage 3 chronic kidney disease  B/p readings reviewed ranging in the 130's/60's-150's/70's with sporadic SBP > 160's.will not change medication for now.continue on spirolactone 25 mg tablet daily,metoprolol tartrate 75 mg tablet twice daily,cozaar 100 mg tablet daily and Furosemide 40 mg tablet twice daily.continue to monitor BMP.moniotor weight.  3. Open wound of left lower extremity, subsequent encounter Afebrile.Progressive healing.continue  with current dressing changes.monitor for signs of infections.  4. Insomnia Continue to require Trazodone 50 mg tablet at bedtime.continue to monitor.    Family/ staff Communication: Reviewed plan of care with patient and facility Nurse supervisor   Labs/tests ordered: None   Sandrea Hughs, NP

## 2017-08-14 DIAGNOSIS — E1159 Type 2 diabetes mellitus with other circulatory complications: Secondary | ICD-10-CM | POA: Diagnosis not present

## 2017-08-14 DIAGNOSIS — B351 Tinea unguium: Secondary | ICD-10-CM | POA: Diagnosis not present

## 2017-08-14 DIAGNOSIS — L84 Corns and callosities: Secondary | ICD-10-CM | POA: Diagnosis not present

## 2017-09-07 ENCOUNTER — Telehealth: Payer: Self-pay | Admitting: *Deleted

## 2017-09-07 NOTE — Telephone Encounter (Signed)
PA for insulin lispro completed and approved via cover my meds. Facility aware.

## 2017-09-13 ENCOUNTER — Encounter: Payer: Self-pay | Admitting: Family

## 2017-09-13 ENCOUNTER — Non-Acute Institutional Stay (SKILLED_NURSING_FACILITY): Payer: Medicare Other | Admitting: Family

## 2017-09-13 DIAGNOSIS — E1122 Type 2 diabetes mellitus with diabetic chronic kidney disease: Secondary | ICD-10-CM

## 2017-09-13 DIAGNOSIS — N183 Chronic kidney disease, stage 3 unspecified: Secondary | ICD-10-CM

## 2017-09-13 DIAGNOSIS — I130 Hypertensive heart and chronic kidney disease with heart failure and stage 1 through stage 4 chronic kidney disease, or unspecified chronic kidney disease: Secondary | ICD-10-CM

## 2017-09-13 DIAGNOSIS — I5042 Chronic combined systolic (congestive) and diastolic (congestive) heart failure: Secondary | ICD-10-CM | POA: Diagnosis not present

## 2017-09-13 DIAGNOSIS — J449 Chronic obstructive pulmonary disease, unspecified: Secondary | ICD-10-CM

## 2017-09-13 DIAGNOSIS — S81802D Unspecified open wound, left lower leg, subsequent encounter: Secondary | ICD-10-CM

## 2017-09-13 DIAGNOSIS — Z794 Long term (current) use of insulin: Secondary | ICD-10-CM

## 2017-09-13 NOTE — Progress Notes (Signed)
Location:  [facility] Room Number: 16 Place of Service:  SNF ([phone]) Provider: Dinara Lupu FNP-C   Blanchie Serve, MD  Patient Care Team: Blanchie Serve, MD as PCP - General (Internal Medicine) Clent Jacks, MD (Ophthalmology) Adrian Prows, MD as Attending Physician (Cardiology) [place], [facility] [name], MD as Consulting Physician (Orthopedic Surgery) Clance, Armando Reichert, MD as Consulting Physician (Pulmonary Disease) Seraphine Gudiel, Nelda Bucks, NP as Nurse Practitioner [facility])  Extended Emergency Contact Information Primary Emergency Contact: Faley,[name] Address: [address]          [place], [place] [address] [name] of [place] Phone: [phone] Mobile Phone: [phone] Relation: Son Secondary Emergency Contact: [name] of [place] Mobile Phone: [phone] Relation: Daughter  Code Status:  DNR Goals of care: Advanced Directive information Advanced Directives 08/13/2017  Does Patient Have a Medical Advance Directive? Yes  Type of Paramedic of [place];Out of facility DNR (pink MOST or yellow form);Living will  Does patient want to make changes to medical advance directive? -  Copy of [place] in Chart? Yes  Pre-existing out of facility DNR order (yellow form or pink MOST form) Yellow form placed in chart (order not valid for inpatient use)     Chief Complaint  Patient presents with  . Medical Management of Chronic Issues    HPI:  Pt is a 84 y.o. female seen today [place] for medical management of chronic diseases.she has a medical history of HTN,Type 2 DM,chronic combined systolic and diastolic CHF,Chronic respiratory failure with hypoxia on continuous oxygen via nasal cannula,CKD stage 3,OAB,OA among other conditions.she is seen in her room today with facility CNA present at bedside.she denies any acute issues during visit.Nurse report no recent acute  illness or hospital admission.No recent fall episodes.Left leg open wound dressing changes continue to be managed by facility Nurse.No signs of infections reported.       Past Medical History:  Diagnosis Date  . Abnormality of gait   . Anemia, unspecified   . Arthritis   . Bronchiectasis without acute exacerbation ([place]) 04/10/2011   CT chest 2011   . Cataract 1990   Dr. Katy Fitch  . Cholelithiases   . Closed fracture of unspecified part of ramus of mandible   . COPD (chronic obstructive pulmonary disease) ([place])   . Coronary atherosclerosis of unspecified type of vessel, native or graft   . Disturbance of skin sensation   . DOE (dyspnea on exertion) 04/10/2011   PFTs 2013:  FEV1 1.04 (78%), ratio 64, couldn't do maneuver for lung volumes or dlco   . Edema   . Esophageal reflux   . Fall from other slipping, tripping, or stumbling   . Gastric ulcer with hemorrhage 2008  . Hypertension   . Hypoxemia   . Insomnia, unspecified   . Mucopurulent chronic bronchitis ([place])   . Nodule of neck 11/07/2013   Left neck posteriorly   . Nontoxic uninodular goiter   . Osteoarthrosis, unspecified whether generalized or localized, unspecified site   . Other and unspecified hyperlipidemia   . Other malaise and fatigue   . Pain in joint, lower leg 2010   right knee  . Pain in joint, shoulder region 2013   left  . Palpitations   . Pneumonia, organism unspecified(486)   . Regional enteritis of unspecified site   . Rosacea   . Sciatica   . Squamous cell carcinoma of skin of lower limb, including  hip 07/27/2012   Nonhealing lesion of the left mid calf medially   . Tachycardia 12/06/2014  . Tension headache   . Tension headache   . Type II or unspecified type diabetes mellitus without mention of complication, not stated as uncontrolled   . Unspecified venous (peripheral) insufficiency   . Urine, incontinence, stress female 07/19/2013   Past Surgical History:  Procedure Laterality Date  . EYE SURGERY  Bilateral 1990   Dr. Katy Fitch  . GASTROJEJUNOSTOMY  05/2004   secondary to ulcer  . JOINT REPLACEMENT Bilateral 1993-1998   total knee replacement  . MOLE REMOVAL  05/11/14   left hand and left side of [facility]  . OTHER SURGICAL HISTORY  2008   Ulcer surgery   . SMALL INTESTINE SURGERY    . TOTAL KNEE ARTHROPLASTY Bilateral 1993, 1998   knees    Allergies  Allergen Reactions  . Adhesive [Tape]     unknown  . Aspirin     unknown  . Augmentin [Amoxicillin-Pot Clavulanate]   . Codeine     unknown  . Diclofenac   . Enalapril Cough  . Hydrocodone     unknown  . Pantoprazole Sodium     unknown  . Voltaren [Diclofenac Sodium]     Allergies as of 09/13/2017      Reactions   Adhesive [tape]    unknown   Aspirin    unknown   Augmentin [amoxicillin-pot Clavulanate]    Codeine    unknown   Diclofenac    Enalapril Cough   Hydrocodone    unknown   Pantoprazole Sodium    unknown   Voltaren [diclofenac Sodium]       Medication List        Accurate as of 09/13/17 11:53 AM. Always use your most recent med list.          acetaminophen 500 MG tablet Commonly known as:  TYLENOL Take 1,000 mg by mouth 2 (two) times daily.   acetaminophen 325 MG tablet Commonly known as:  TYLENOL Take 650 mg by mouth every 6 (six) hours as needed for moderate pain.   Fluticasone-Salmeterol 100-50 MCG/DOSE Aepb Commonly known as:  ADVAIR Inhale 1 puff into the lungs 2 (two) times daily.   furosemide 40 MG tablet Commonly known as:  LASIX Take 40 mg by mouth 2 (two) times daily. Hold for SBP <110   guaiFENesin 600 MG 12 hr tablet Commonly known as:  MUCINEX Take 600 mg by mouth 2 (two) times daily.   guaifenesin 100 MG/5ML syrup Commonly known as:  ROBITUSSIN Take by mouth every 6 (six) hours as needed for cough (Give 10 mL).   HUMALOG 100 UNIT/ML cartridge Generic drug:  insulin lispro Inject 12 Units into the skin. At 1230 PM and 530 PM. (If blood sugar is  150-250 = 12 units, if blood sugar is greater than 250 = 15 units daily)   insulin glargine 100 UNIT/ML injection Commonly known as:  LANTUS Inject 26 Units into the skin at bedtime.   ipratropium-albuterol 0.5-2.5 (3) MG/3ML Soln Commonly known as:  DUONEB Take 3 mLs by nebulization every 8 (eight) hours as needed.   ipratropium-albuterol 0.5-2.5 (3) MG/3ML Soln Commonly known as:  DUONEB Take 3 mLs by nebulization 2 (two) times daily.   losartan 100 MG tablet Commonly known as:  COZAAR Take 100 mg daily by mouth.   metoprolol tartrate 50 MG tablet Commonly known as:  LOPRESSOR Take 75 mg by mouth 2 (  two) times daily.   mineral oil-hydrophilic petrolatum ointment Apply 1 application daily as needed topically for dry skin (to the left upper arm).   multivitamin with minerals tablet Take 1 tablet daily by mouth.   omeprazole 20 MG capsule Commonly known as:  PRILOSEC Take 20 mg by mouth daily.   OXYGEN Inhale 1.5 L/min into the lungs.   predniSONE 5 MG tablet Commonly known as:  DELTASONE Take 5 mg by mouth daily with breakfast.   spironolactone 25 MG tablet Commonly known as:  ALDACTONE Take 25 mg by mouth daily.   traZODone 50 MG tablet Commonly known as:  DESYREL Take 50 mg by mouth at bedtime.   trolamine salicylate 10 % cream Commonly known as:  ASPERCREME Apply 1 application topically 3 (three) times daily. Both knees       Review of Systems  Constitutional: Negative for appetite change, chills, fatigue, fever and unexpected weight change.  HENT: Positive for hearing loss. Negative for congestion, rhinorrhea, sinus pressure, sinus pain, sneezing, sore throat and trouble swallowing.        Wears hearing aids   Eyes: Positive for visual disturbance. Negative for discharge, redness and itching.       Wears eye glasses   Respiratory: Negative for chest tightness and wheezing.        Chronic cough  Shortness of breath with exertion.Has oxygen via nasal  cannula but takes it off while walking to the bathroom.Longer oxygen tubings recommended but prefers shorter tubings " I'm afraid tubings will tangle".   Cardiovascular: Positive for leg swelling. Negative for chest pain and palpitations.  Gastrointestinal: Negative for abdominal distention, abdominal pain, constipation, diarrhea, nausea and vomiting.  Endocrine: Negative for cold intolerance, heat intolerance, polydipsia and polyphagia.  Genitourinary: Negative for dysuria, flank pain, frequency and urgency.  Musculoskeletal: Positive for arthralgias, gait problem and neck pain. Negative for neck stiffness.  Skin: Negative for color change, pallor and rash.       Left leg open wound dressing changes managed by facility  Nurse   Neurological: Negative for dizziness, light-headedness and headaches.  Hematological: Does not bruise/bleed easily.  Psychiatric/Behavioral: Negative for agitation, confusion and sleep disturbance. The patient is not nervous/anxious.     Immunization History  Administered Date(s) Administered  . Influenza Split 11/06/2011, 11/04/2012  . Influenza,inj,Quad PF,6+ Mos 11/17/2013  . Influenza-Unspecified 11/09/2014, 11/16/2015, 11/25/2015, 11/13/2016  . PPD Test 06/19/2010, 11/29/2014  . Pneumococcal Conjugate-13 08/11/2014  . Pneumococcal Polysaccharide-23 02/04/2003  . Pneumococcal-Unspecified 06/07/2014  . Tdap 02/27/2013  . Zoster 02/03/2006   Pertinent  Health Maintenance Due  Topic Date Due  . OPHTHALMOLOGY EXAM  07/22/2017  . INFLUENZA VACCINE  09/03/2017  . DEXA SCAN  07/18/2026 (Originally 10/31/1984)  . HEMOGLOBIN A1C  01/26/2018  . FOOT EXAM  06/06/2018  . PNA vac Low Risk Adult  Completed   Fall Risk  02/02/2017 12/06/2014 07/11/2014 05/22/2014 11/07/2013  Falls in the past year? - Yes No Yes No  Number falls in past yr: - 1 - 1 -  Injury with Fall? - No - No -  Risk for fall due to : History of fall(s);Impaired mobility History of fall(s);Impaired  mobility - History of fall(s);Impaired balance/gait -  Follow up - - - Falls evaluation completed -   Functional Status Survey:    Vitals:   09/13/17 1143  BP: (!) 150/69  Pulse: 73  Resp: 20  Temp: 98.2 F (36.8 C)  SpO2: 95%  Weight: 215 lb (97.5 kg)  Height: 5\' 7"  (1.702 m)   Body mass index is 33.67 kg/m. Physical Exam  Constitutional: She is oriented to person, place, and time. She appears well-developed.  Elderly in no acute distress   HENT:  Head: Normocephalic.  Right Ear: External ear normal.  Left Ear: External ear normal.  Mouth/Throat: Oropharynx is clear and moist. No oropharyngeal exudate.  Bilateral hearing aids in place   Eyes: Pupils are equal, round, and reactive to light. Conjunctivae and EOM are normal. Right eye exhibits no discharge. Left eye exhibits no discharge. No scleral icterus.  Wears eye glasses.off during visit   Neck: Normal range of motion. No JVD present. No tracheal deviation present. No thyromegaly present.  Cardiovascular: Intact distal pulses. Exam reveals no gallop and no friction rub.  Murmur heard. Pulmonary/Chest: Effort normal. No respiratory distress. She has no wheezes. She has no rales.  bilateral poor air entry.oxygen 1.5 liters via nasal cannula in place   Abdominal: Soft. Bowel sounds are normal. She exhibits no distension and no mass. There is no tenderness. There is no rebound and no guarding.  Musculoskeletal: She exhibits no tenderness.  Moves x 4 extremities unsteady gait ambulates to the bathroom with FWW but uses scooter for long distance.trace edema to lower extremities.Ted hose off during visit for morning care.   Lymphadenopathy:    She has no cervical adenopathy.  Neurological: She is oriented to person, place, and time. She has normal strength. Gait abnormal.  HOH   Skin: Skin is warm and dry. No rash noted. No erythema. No pallor.  Left lower inner leg open shallow wound bed red without any drainage  noted.surrounding skin tissues non-tender to touch and without any redness.    Psychiatric: She has a normal mood and affect. Her speech is normal and behavior is normal. Judgment and thought content normal.  Nursing note and vitals reviewed.   Labs reviewed: Recent Labs    01/08/17 04/24/17 06/18/17  NA 137  137 140 138  138  K 3.8  3.8 4.7 4.6  4.6  CL 100 100 97  CO2 30 36 34  BUN 46* 47* 41*  41*  CREATININE 1.0  1.03 1.2* 1.1  1.09  CALCIUM 9.0  9.0 9.0 9.1  9.1   Recent Labs    04/24/17  AST 15  ALT 11  ALKPHOS 106  PROT 5.3  ALBUMIN 3.2   Recent Labs    12/08/16 04/24/17 06/18/17  WBC 9.1  9.1 7.9 8.5  8.5  HGB 11.4*  11.4 10.0* 10.9*  10.9  HCT 34*  33.8 30* 32*  32.2  PLT 235 186 216   Lab Results  Component Value Date   TSH 2.61 07/10/2016   Lab Results  Component Value Date   HGBA1C 7.2 07/27/2017   Lab Results  Component Value Date   CHOL 117 12/15/2016   HDL 52 12/15/2016   LDLCALC 40 12/15/2016   TRIG 186 (A) 12/15/2016    Significant Diagnostic Results in last 30 days:  No results found.  Assessment/Plan 1. Hypertensive heart and kidney disease with chronic combined systolic and diastolic congestive heart failure and stage 3 chronic kidney disease  B/p readings reviewed much improvement noted since metoprolol dosage was increased.Readings ranging in 110's/70's-150's/80's with few sporadic SBP readings > 150-170.continue aldactone 25 mg tablet daily,metoprolol tartrate 75 mg tablet twice daily,Furosemide 40 mg tablet twice daily and Losartan 100 mg tablet daily.check CBC,CMP and TSH level 09/17/2017.   2. Controlled type 2 diabetes  mellitus with stage 3 chronic kidney disease, with long-term current use of insulin  Lab Results  Component Value Date   HGBA1C 7.2 07/27/2017  CBG readings reviewed under control 90's-150's.No signs of hypo/hyperglycemia.continue Lantus 26 units at bedtime and Humalog 12 units twice daily( at 12:30 and  5:30 PM) for CBG > 150-250 and 15 units for CBG > 250. On ARB for renal protection.Also on BBB.currnetly of ASA due to her advance age and high risk for bleeding and fall.Upto date with annual foot exam.Will need an ophthalmology referral for annual eye exam.TSH level as above.   3. Chronic combined systolic and diastolic congestive heart failure Stable.No signs of fluid overload.continue on  aldactone 25 mg tablet daily,metoprolol tartrate 75 mg tablet twice daily,Furosemide 40 mg tablet twice daily and Losartan 100 mg tablet daily.    4. Chronic obstructive pulmonary disease, unspecified COPD type Afebrile.No changes in sputum production.recommended longer oxygen tubings to reach the bathroom to prevent shortness of breath but she is afraid she might tangle with tubings and cause her to fall.Might consider using her portable oxygen but still relaxant.continue with Advair 100-50 mcg one puff twice daily,Duoneb twice daily and every 8 hour as needed.continue Mucinex 600 mg tablet twice daily and Robitussin every 6 hours as needed.  Family/ staff Communication: Reviewed plan of care with patient and facility Nurse supervisor   Labs/tests ordered: CBC,CMP and TSH level 09/17/2017.   Sandrea Hughs, NP

## 2017-09-16 ENCOUNTER — Encounter: Payer: Self-pay | Admitting: Internal Medicine

## 2017-09-17 DIAGNOSIS — R05 Cough: Secondary | ICD-10-CM | POA: Diagnosis not present

## 2017-09-17 DIAGNOSIS — R531 Weakness: Secondary | ICD-10-CM | POA: Diagnosis not present

## 2017-09-17 DIAGNOSIS — J9620 Acute and chronic respiratory failure, unspecified whether with hypoxia or hypercapnia: Secondary | ICD-10-CM | POA: Diagnosis not present

## 2017-09-17 LAB — COMPLETE METABOLIC PANEL WITH GFR
ALBUMIN: 3.3
ALK PHOS: 89
ALT: 10
AST: 15
BILIRUBIN TOTAL: 0.2
BUN: 58 — AB (ref 4–21)
Calcium: 9
Creat: 1.28
Glucose: 90
Potassium: 4.7
Sodium: 137
TOTAL PROTEIN: 5.3 g/dL

## 2017-09-17 LAB — CBC
HCT: 31.4
HEMOGLOBIN: 10.4
MCV: 95.7
WBC: 9.1
platelet count: 214

## 2017-09-17 LAB — TSH: TSH: 3.48

## 2017-09-18 ENCOUNTER — Encounter: Payer: Self-pay | Admitting: *Deleted

## 2017-10-01 DIAGNOSIS — I10 Essential (primary) hypertension: Secondary | ICD-10-CM | POA: Diagnosis not present

## 2017-10-01 LAB — BASIC METABOLIC PANEL
BUN: 62 — AB (ref 4–21)
CALCIUM: 9.1
Creat: 1.33
Glucose: 117
POTASSIUM: 4.6
SODIUM: 136

## 2017-10-02 ENCOUNTER — Encounter: Payer: Self-pay | Admitting: *Deleted

## 2017-10-13 ENCOUNTER — Non-Acute Institutional Stay (SKILLED_NURSING_FACILITY): Payer: Medicare Other | Admitting: Family

## 2017-10-13 ENCOUNTER — Encounter: Payer: Self-pay | Admitting: Family

## 2017-10-13 DIAGNOSIS — Z85828 Personal history of other malignant neoplasm of skin: Secondary | ICD-10-CM | POA: Diagnosis not present

## 2017-10-13 DIAGNOSIS — L97921 Non-pressure chronic ulcer of unspecified part of left lower leg limited to breakdown of skin: Secondary | ICD-10-CM

## 2017-10-13 DIAGNOSIS — S81809D Unspecified open wound, unspecified lower leg, subsequent encounter: Secondary | ICD-10-CM | POA: Diagnosis not present

## 2017-10-13 DIAGNOSIS — L570 Actinic keratosis: Secondary | ICD-10-CM | POA: Diagnosis not present

## 2017-10-13 NOTE — Progress Notes (Signed)
Location:  [place] Room Number: 16 Place of Service:  SNF ([phone]) Provider: Dinah Ngetich FNP-C  Blanchie Serve, MD  Patient Care Team: Blanchie Serve, MD as PCP - General (Internal Medicine) Clent Jacks, MD (Ophthalmology) Adrian Prows, MD as Attending Physician (Cardiology) Rebecca, Friends [facility] [name], MD as Consulting Physician (Orthopedic Surgery) Clance, Armando Reichert, MD as Consulting Physician (Pulmonary Disease) Ngetich, Nelda Bucks, NP as Nurse Practitioner [facility] Medicine)  Extended Emergency Contact Information Primary Emergency Contact: Selway,[name] Address: [address]          [place], [place] [address] [name] of [place] Phone: [phone] Mobile Phone: [phone] Relation: Son Secondary Emergency Contact: [name] [place] of [place] Mobile Phone: [phone] Relation: Daughter  Code Status:  DNR Goals of care: Advanced Directive information Advanced Directives 10/13/2017  Does Patient Have a Medical Advance Directive? Yes  Type of Paramedic of [place];Out of facility DNR (pink MOST or yellow form);Living will  Does patient want to make changes to medical advance directive? -  Copy of [place] in Chart? Yes  Pre-existing out of facility DNR order (yellow form or pink MOST form) Yellow form placed in chart (order not valid for inpatient use)     Chief Complaint  Patient presents with  . Acute Visit    check leg wound    HPI:  Pt is a 84 y.o. female seen today at [facility] for an acute visit for evaluation of left leg wound.she is seen in her room today.she states has an appointment this afternoon with dermatologist for left leg wound wonders if still need to keep the appointment.Of note she has had left leg open area since 08/2017.she denies any fever,chills,redness or drainage from open area.she has a medical history of COPD,controlled Type 2 DM,Squamous cell  carcinoma of left lateral inferior deltoid 11/12/2016,CKD stage 3 among other conditions.  Past Medical History:  Diagnosis Date  . Abnormality of gait   . Anemia, unspecified   . Arthritis   . Bronchiectasis without acute exacerbation ([place]) 04/10/2011   CT chest 2011   . Cataract 1990   Dr. Katy Fitch  . Cholelithiases   . Closed fracture of unspecified part of ramus of mandible   . COPD (chronic obstructive pulmonary disease) ([place])   . Coronary atherosclerosis of unspecified type of vessel, native or graft   . Disturbance of skin sensation   . DOE (dyspnea on exertion) 04/10/2011   PFTs 2013:  FEV1 1.04 (78%), ratio 64, couldn't do maneuver for lung volumes or dlco   . Edema   . Esophageal reflux   . Fall from other slipping, tripping, or stumbling   . Gastric ulcer with hemorrhage 2008  . Hypertension   . Hypoxemia   . Insomnia, unspecified   . Mucopurulent chronic bronchitis ([place])   . Nodule of neck 11/07/2013   Left neck posteriorly   . Nontoxic uninodular goiter   . Osteoarthrosis, unspecified whether generalized or localized, unspecified site   . Other and unspecified hyperlipidemia   . Other malaise and fatigue   . Pain in joint, lower leg 2010   right knee  . Pain in joint, shoulder region 2013   left  . Palpitations   . Pneumonia, organism unspecified(486)   . Regional enteritis of unspecified site   . Rosacea   . Sciatica   . Squamous cell carcinoma of skin of lower limb, including hip 07/27/2012   Nonhealing  lesion of the left mid calf medially   . Tachycardia 12/06/2014  . Tension headache   . Tension headache   . Type II or unspecified type diabetes mellitus without mention of complication, not stated as uncontrolled   . Unspecified venous (peripheral) insufficiency   . Urine, incontinence, stress female 07/19/2013   Past Surgical History:  Procedure Laterality Date  . EYE SURGERY Bilateral 1990   Dr. Katy Fitch  . GASTROJEJUNOSTOMY  05/2004   secondary to ulcer  .  JOINT REPLACEMENT Bilateral 1993-1998   total knee replacement  . MOLE REMOVAL  05/11/14   left hand and left side of face [facility]  . OTHER SURGICAL HISTORY  2008   Ulcer surgery   . SMALL INTESTINE SURGERY    . TOTAL KNEE ARTHROPLASTY Bilateral 1993, 1998   knees    Allergies  Allergen Reactions  . Adhesive [Tape]     unknown  . Aspirin     unknown  . Augmentin [Amoxicillin-Pot Clavulanate]   . Codeine     unknown  . Diclofenac   . Enalapril Cough  . Hydrocodone     unknown  . Pantoprazole Sodium     unknown  . Voltaren [Diclofenac Sodium]     Outpatient Encounter Medications as of 10/13/2017  Medication Sig  . acetaminophen (TYLENOL) 325 MG tablet Take 650 mg by mouth every 6 (six) hours as needed for moderate pain.   Marland Kitchen acetaminophen (TYLENOL) 500 MG tablet Take 1,000 mg by mouth 2 (two) times daily.   . Fluticasone-Salmeterol (ADVAIR) 100-50 MCG/DOSE AEPB Inhale 1 puff into the lungs 2 (two) times daily.  . furosemide (LASIX) 40 MG tablet Take 40 mg by mouth 2 (two) times daily. Hold for SBP <110  . guaiFENesin (MUCINEX) 600 MG 12 hr tablet Take 600 mg by mouth 2 (two) times daily.  Marland Kitchen guaifenesin (ROBITUSSIN) 100 MG/5ML syrup Take by mouth every 6 (six) hours as needed for cough (Give 10 mL).  . insulin glargine (LANTUS) 100 UNIT/ML injection Inject 26 Units into the skin at bedtime.   . insulin lispro (HUMALOG) 100 UNIT/ML cartridge Inject 12 Units into the skin. At 1230 PM and 530 PM. (If blood sugar is 150-250 = 12 units, if blood sugar is greater than 250 = 15 units daily)  . ipratropium-albuterol (DUONEB) 0.5-2.5 (3) MG/3ML SOLN Take 3 mLs by nebulization every 8 (eight) hours as needed.   Marland Kitchen ipratropium-albuterol (DUONEB) 0.5-2.5 (3) MG/3ML SOLN Take 3 mLs by nebulization 2 (two) times daily.  Marland Kitchen lidocaine (LIDODERM) 5 % Place 1 patch onto the skin daily. Remove & Discard patch within 12 hours or as directed by MD  . losartan (COZAAR) 100 MG tablet Take 100  mg daily by mouth.  . metoprolol tartrate (LOPRESSOR) 50 MG tablet Take 75 mg by mouth 2 (two) times daily.   . mineral oil-hydrophilic petrolatum (AQUAPHOR) ointment Apply 1 application daily as needed topically for dry skin (to the left upper arm).  . Multiple Vitamins-Minerals (MULTIVITAMIN WITH MINERALS) tablet Take 1 tablet daily by mouth.  Marland Kitchen omeprazole (PRILOSEC) 20 MG capsule Take 20 mg by mouth daily.   . OXYGEN Inhale 1.5 L/min into the lungs.   . predniSONE (DELTASONE) 5 MG tablet Take 5 mg by mouth daily with breakfast.  . spironolactone (ALDACTONE) 25 MG tablet Take 25 mg by mouth daily.   . traZODone (DESYREL) 50 MG tablet Take 50 mg by mouth at bedtime.  . trolamine salicylate (ASPERCREME) 10 % cream  Apply 1 application topically 3 (three) times daily. Both knees   No facility-administered encounter medications on file as of 10/13/2017.     Review of Systems  Constitutional: Negative for appetite change, chills, fatigue and fever.  Eyes: Positive for visual disturbance. Negative for discharge and redness.       Wears eye glasses Hx macular degeneration   Respiratory:       Chronic cough and shortness of breath with exertion   Cardiovascular: Negative for chest pain, palpitations and leg swelling.  Gastrointestinal: Negative for abdominal distention, abdominal pain, constipation, diarrhea, nausea and vomiting.  Musculoskeletal: Positive for arthralgias and gait problem.  Skin: Negative for color change, pallor and rash.       Left lateral leg ulcer dressing managed by facility Nurse non healing since 08/2017.     Immunization History  Administered Date(s) Administered  . Influenza Split 11/06/2011, 11/04/2012  . Influenza,inj,Quad PF,6+ Mos 11/17/2013  . Influenza-Unspecified 11/09/2014, 11/16/2015, 11/25/2015, 11/13/2016  . PPD Test 06/19/2010, 11/29/2014  . Pneumococcal Conjugate-13 08/11/2014  . Pneumococcal Polysaccharide-23 02/04/2003  . Pneumococcal-Unspecified  06/07/2014  . Tdap 02/27/2013  . Zoster 02/03/2006   Pertinent  Health Maintenance Due  Topic Date Due  . OPHTHALMOLOGY EXAM  07/22/2017  . INFLUENZA VACCINE  09/03/2017  . DEXA SCAN  07/18/2026 (Originally 10/31/1984)  . HEMOGLOBIN A1C  01/26/2018  . FOOT EXAM  06/06/2018  . PNA vac Low Risk Adult  Completed   Fall Risk  02/02/2017 12/06/2014 07/11/2014 05/22/2014 11/07/2013  Falls in the past year? - Yes No Yes No  Number falls in past yr: - 1 - 1 -  Injury with Fall? - No - No -  Risk for fall due to : History of fall(s);Impaired mobility History of fall(s);Impaired mobility - History of fall(s);Impaired balance/gait -  Follow up - - - Falls evaluation completed -    Vitals:   10/13/17 1212  BP: 138/86  Pulse: 96  Resp: 20  Temp: (!) 97.3 F (36.3 C)  SpO2: 96%  Weight: 216 lb (98 kg)  Height: 5\' 7"  (1.702 m)   Body mass index is 33.83 kg/m. Physical Exam  Constitutional: She is oriented to person, place, and time.  Obese,elderly in no acute distress  HENT:  Head: Normocephalic.  Mouth/Throat: Oropharynx is clear and moist. No oropharyngeal exudate.  Eyes: Pupils are equal, round, and reactive to light. Conjunctivae are normal. Right eye exhibits no discharge. Left eye exhibits no discharge. No scleral icterus.  Neck: No thyromegaly present.  Cardiovascular: Normal rate, regular rhythm, normal heart sounds and intact distal pulses. Exam reveals no gallop and no friction rub.  No murmur heard. Pulmonary/Chest: Effort normal and breath sounds normal. No respiratory distress. She has no wheezes. She has no rales.  Oxygen via nasal cannula in place   Abdominal: Soft. Bowel sounds are normal. She exhibits no distension and no mass. There is no tenderness. There is no rebound and no guarding.  Lymphadenopathy:    She has no cervical adenopathy.  Neurological: She is oriented to person, place, and time.  Skin: Skin is warm and dry. No rash noted. No erythema. No pallor.  Left  lateral leg  shallow ulcer wound bed red without any drainage.surrounding skin tissue without any signs of infections.   Psychiatric: She has a normal mood and affect. Her speech is normal and behavior is normal. Judgment and thought content normal.  Nursing note and vitals reviewed.   Labs reviewed: Recent Labs  01/08/17 04/24/17 06/18/17 09/17/17 10/01/17  NA 137  137 140 138  138 137 136  K 3.8  3.8 4.7 4.6  4.6 4.7 4.6  CL 100 100 97  --   --   CO2 30 36 34  --   --   BUN 46* 47* 41*  41* 58* 62*  CREATININE 1.0  1.03 1.2* 1.1  1.09 1.28 1.33  CALCIUM 9.0  9.0 9.0 9.1  9.1 9.0 9.1   Recent Labs    04/24/17 09/17/17  AST 15 15  ALT 11 10  ALKPHOS 106 89  BILITOT  --  0.2  PROT 5.3 5.3  ALBUMIN 3.2 3.3   Recent Labs    12/08/16 04/24/17 06/18/17 09/17/17  WBC 9.1  9.1 7.9 8.5  8.5 9.1  HGB 11.4*  11.4 10.0* 10.9*  10.9 10.4  HCT 34*  33.8 30* 32*  32.2 31.4  MCV  --   --   --  95.7  PLT 235 186 216  --    Lab Results  Component Value Date   TSH 3.48 09/17/2017   Lab Results  Component Value Date   HGBA1C 7.2 07/27/2017   Lab Results  Component Value Date   CHOL 117 12/15/2016   HDL 52 12/15/2016   LDLCALC 40 12/15/2016   TRIG 186 (A) 12/15/2016    Significant Diagnostic Results in last 30 days:  No results found.  Assessment/Plan   Nonhealing ulcer of left lower extremity limited to breakdown of skin (HCC) Afebrile.Nonhealing shallow ulcer wound bed red without any drainage.surrounding skin tissue without any signs of infections.Her medical comorbidity  COPD,Types 2 DM,CKD stage 3 and currently on Prednisone 5 mg tablet daily could be contributing to her delayed wound healing.Given her history of Squamous cell carcinoma in 11/2016 will have her evaluated by dermatology to rule out malignancy.Continue to cleanse ulcer with saline cover with xeroform gauze and dry gauze. Change dressing every 3 days.Notify provider for any signs of  infections.  Family/ staff Communication: Reviewed plan of care with patient and facility Nurse.  Labs/tests ordered: None   Dinah C Ngetich, NP

## 2017-10-14 ENCOUNTER — Encounter: Payer: Self-pay | Admitting: Internal Medicine

## 2017-10-14 ENCOUNTER — Non-Acute Institutional Stay (SKILLED_NURSING_FACILITY): Payer: Medicare Other | Admitting: Internal Medicine

## 2017-10-14 DIAGNOSIS — E1151 Type 2 diabetes mellitus with diabetic peripheral angiopathy without gangrene: Secondary | ICD-10-CM

## 2017-10-14 DIAGNOSIS — J449 Chronic obstructive pulmonary disease, unspecified: Secondary | ICD-10-CM

## 2017-10-14 DIAGNOSIS — N183 Chronic kidney disease, stage 3 unspecified: Secondary | ICD-10-CM

## 2017-10-14 DIAGNOSIS — I130 Hypertensive heart and chronic kidney disease with heart failure and stage 1 through stage 4 chronic kidney disease, or unspecified chronic kidney disease: Secondary | ICD-10-CM

## 2017-10-14 DIAGNOSIS — S81802S Unspecified open wound, left lower leg, sequela: Secondary | ICD-10-CM

## 2017-10-14 DIAGNOSIS — I5042 Chronic combined systolic (congestive) and diastolic (congestive) heart failure: Secondary | ICD-10-CM

## 2017-10-14 NOTE — Progress Notes (Signed)
Location:  [facility] Room Number: 16 Place of Service:  SNF [phone]) Provider:  Blanchie Serve MD  Blanchie Serve, MD  Patient Care Team: Blanchie Serve, MD as PCP - General (Internal Medicine) Clent Jacks, MD (Ophthalmology) Adrian Prows, MD as Attending Physician (Cardiology) [place], [facility] [name], MD as Consulting Physician (Orthopedic Surgery) Clance, Armando Reichert, MD as Consulting Physician (Pulmonary Disease) Ngetich, Nelda Bucks, NP as Nurse Practitioner [facility])  Extended Emergency Contact Information Primary Emergency Contact: Kubin,[name] Address: [address]          [place], [place] [address] [name] of [place] Phone: [phone] Mobile Phone: [phone] Relation: Son Secondary Emergency Contact: [name] [place] of [place] Mobile Phone: [phone] Relation: Daughter  Code Status:  DNR  Goals of care: Advanced Directive information Advanced Directives 10/14/2017  Does Patient Have a Medical Advance Directive? Yes  Type of Paramedic of [place];Out of facility DNR (pink MOST or yellow form);Living will  Does patient want to make changes to medical advance directive? -  Copy of [place] in Chart? Yes  Pre-existing out of facility DNR order (yellow form or pink MOST form) Yellow form placed in chart (order not valid for inpatient use)     Chief Complaint  Patient presents with  . Medical Management of Chronic Issues    monthly routine visit     HPI:  Pt is a 84 y.o. female seen today for medical management of chronic diseases.   COPD- continues to be on oxygen and bronchodilator treatment. Breathing stable at rest, some dyspnea with mild exertion. Sleeps on her recliner. Has chronic respiratory failure. Taking daily prednisone.  Insomnia- takes trazodone 50 mg qhs and is able to sleep well for most nights.  Hypertensive heart disease with CHF- takes  metoprolol tartrate 75 mg bid, losartan 100 mg daily with lasix 40 mg bid and spironolactone 25 mg daily. Few elevated SBP readings on review with SBP in 150-160 range.   CKD stage 3- no urinary complaints. Has history of DM, HTN.   Diabetes type 2 with circulatory problem- open wound to LLE. All blood sugar below <200 on chart review. On lantus 26 u qhs and humalog 12 u with lunch and dinner.    Past Medical History:  Diagnosis Date  . Abnormality of gait   . Anemia, unspecified   . Arthritis   . Bronchiectasis without acute exacerbation ([place]) 04/10/2011   CT chest 2011   . Cataract 1990   Dr. Katy Fitch  . Cholelithiases   . Closed fracture of unspecified part of ramus of mandible   . COPD (chronic obstructive pulmonary disease) ([place])   . Coronary atherosclerosis of unspecified type of vessel, native or graft   . Disturbance of skin sensation   . DOE (dyspnea on exertion) 04/10/2011   PFTs 2013:  FEV1 1.04 (78%), ratio 64, couldn't do maneuver for lung volumes or dlco   . Edema   . Esophageal reflux   . Fall from other slipping, tripping, or stumbling   . Gastric ulcer with hemorrhage 2008  . Hypertension   . Hypoxemia   . Insomnia, unspecified   . Mucopurulent chronic bronchitis ([place])   . Nodule of neck 11/07/2013   Left neck posteriorly   . Nontoxic uninodular goiter   . Osteoarthrosis, unspecified whether generalized or localized, unspecified site   . Other and unspecified hyperlipidemia   . Other malaise and fatigue   .  Pain in joint, lower leg 2010   right knee  . Pain in joint, shoulder region 2013   left  . Palpitations   . Pneumonia, organism unspecified(486)   . Regional enteritis of unspecified site   . Rosacea   . Sciatica   . Squamous cell carcinoma of skin of lower limb, including hip 07/27/2012   Nonhealing lesion of the left mid calf medially   . Tachycardia 12/06/2014  . Tension headache   . Tension headache   . Type II or unspecified type diabetes mellitus  without mention of complication, not stated as uncontrolled   . Unspecified venous (peripheral) insufficiency   . Urine, incontinence, stress female 07/19/2013   Past Surgical History:  Procedure Laterality Date  . EYE SURGERY Bilateral 1990   Dr. Katy Fitch  . GASTROJEJUNOSTOMY  05/2004   secondary to ulcer  . JOINT REPLACEMENT Bilateral 1993-1998   total knee replacement  . MOLE REMOVAL  05/11/14   left hand and left side of face [facility]  . OTHER SURGICAL HISTORY  2008   Ulcer surgery   . SMALL INTESTINE SURGERY    . TOTAL KNEE ARTHROPLASTY Bilateral 1993, 1998   knees    Allergies  Allergen Reactions  . Adhesive [Tape]     unknown  . Aspirin     unknown  . Augmentin [Amoxicillin-Pot Clavulanate]   . Codeine     unknown  . Diclofenac   . Enalapril Cough  . Hydrocodone     unknown  . Pantoprazole Sodium     unknown  . Voltaren [Diclofenac Sodium]     Outpatient Encounter Medications as of 10/14/2017  Medication Sig  . acetaminophen (TYLENOL) 325 MG tablet Take 650 mg by mouth every 6 (six) hours as needed for moderate pain.   Marland Kitchen acetaminophen (TYLENOL) 500 MG tablet Take 1,000 mg by mouth 2 (two) times daily.   . Fluticasone-Salmeterol (ADVAIR) 100-50 MCG/DOSE AEPB Inhale 1 puff into the lungs 2 (two) times daily.  . furosemide (LASIX) 40 MG tablet Take 40 mg by mouth 2 (two) times daily. Hold for SBP <110  . guaiFENesin (MUCINEX) 600 MG 12 hr tablet Take 600 mg by mouth 2 (two) times daily.  Marland Kitchen guaifenesin (ROBITUSSIN) 100 MG/5ML syrup Take by mouth every 6 (six) hours as needed for cough (Give 10 mL).  . insulin glargine (LANTUS) 100 UNIT/ML injection Inject 26 Units into the skin at bedtime.   . insulin lispro (HUMALOG) 100 UNIT/ML cartridge Inject 12 Units into the skin. At 1230 PM and 530 PM. (If blood sugar is 150-250 = 12 units, if blood sugar is greater than 250 = 15 units daily)  . ipratropium-albuterol (DUONEB) 0.5-2.5 (3) MG/3ML SOLN Take 3 mLs by  nebulization every 8 (eight) hours as needed.   Marland Kitchen ipratropium-albuterol (DUONEB) 0.5-2.5 (3) MG/3ML SOLN Take 3 mLs by nebulization 2 (two) times daily.  Marland Kitchen lidocaine (LIDODERM) 5 % Place 1 patch onto the skin daily. Remove & Discard patch within 12 hours or as directed by MD  . losartan (COZAAR) 100 MG tablet Take 100 mg daily by mouth.  . metoprolol tartrate (LOPRESSOR) 50 MG tablet Take 75 mg by mouth 2 (two) times daily.   . mineral oil-hydrophilic petrolatum (AQUAPHOR) ointment Apply 1 application daily as needed topically for dry skin (to the left upper arm).  . Multiple Vitamins-Minerals (MULTIVITAMIN WITH MINERALS) tablet Take 1 tablet daily by mouth.  Marland Kitchen omeprazole (PRILOSEC) 20 MG capsule Take 20 mg by mouth  daily.   . OXYGEN Inhale 1.5 L/min into the lungs.   . predniSONE (DELTASONE) 5 MG tablet Take 5 mg by mouth daily with breakfast.  . spironolactone (ALDACTONE) 25 MG tablet Take 25 mg by mouth daily.   . traZODone (DESYREL) 50 MG tablet Take 50 mg by mouth at bedtime.  . trolamine salicylate (ASPERCREME) 10 % cream Apply 1 application topically 3 (three) times daily. Both knees   No facility-administered encounter medications on file as of 10/14/2017.     Review of Systems  Constitutional: Negative for appetite change and fever.  HENT: Positive for hearing loss. Negative for congestion, mouth sores, rhinorrhea, sore throat and trouble swallowing.   Respiratory: Positive for shortness of breath and wheezing. Negative for cough.   Cardiovascular: Positive for leg swelling. Negative for chest pain and palpitations.  Gastrointestinal: Negative for abdominal pain, constipation, diarrhea, nausea and vomiting.  Genitourinary: Negative for dysuria and hematuria.  Musculoskeletal: Positive for arthralgias and gait problem.  Neurological: Positive for weakness. Negative for dizziness and headaches.  Psychiatric/Behavioral: Positive for decreased concentration.    Immunization History    Administered Date(s) Administered  . Influenza Split 11/06/2011, 11/04/2012  . Influenza,inj,Quad PF,6+ Mos 11/17/2013  . Influenza-Unspecified 11/09/2014, 11/16/2015, 11/25/2015, 11/13/2016  . PPD Test 06/19/2010, 11/29/2014  . Pneumococcal Conjugate-13 08/11/2014  . Pneumococcal Polysaccharide-23 02/04/2003  . Pneumococcal-Unspecified 06/07/2014  . Tdap 02/27/2013  . Zoster 02/03/2006   Pertinent  Health Maintenance Due  Topic Date Due  . OPHTHALMOLOGY EXAM  07/22/2017  . INFLUENZA VACCINE  09/03/2017  . DEXA SCAN  07/18/2026 (Originally 10/31/1984)  . HEMOGLOBIN A1C  01/26/2018  . FOOT EXAM  06/06/2018  . PNA vac Low Risk Adult  Completed   Fall Risk  02/02/2017 12/06/2014 07/11/2014 05/22/2014 11/07/2013  Falls in the past year? - Yes No Yes No  Number falls in past yr: - 1 - 1 -  Injury with Fall? - No - No -  Risk for fall due to : History of fall(s);Impaired mobility History of fall(s);Impaired mobility - History of fall(s);Impaired balance/gait -  Follow up - - - Falls evaluation completed -   Functional Status Survey:    Vitals:   10/14/17 1238  BP: (!) 156/84  Pulse: 66  Resp: 18  Temp: 98.4 F (36.9 C)  SpO2: 95%  Weight: 216 lb (98 kg)  Height: 5\' 7"  (1.702 m)   Body mass index is 33.83 kg/m.   Wt Readings from Last 3 Encounters:  10/14/17 216 lb (98 kg)  10/13/17 216 lb (98 kg)  09/13/17 215 lb (97.5 kg)   Physical Exam  Constitutional: She is oriented to person, place, and time. No distress.  Obese elderly female in NAD  HENT:  Head: Normocephalic and atraumatic.  Mouth/Throat: Oropharynx is clear and moist.  Eyes: Pupils are equal, round, and reactive to light. Conjunctivae and EOM are normal.  Neck: Normal range of motion. Neck supple.  Cardiovascular: Normal rate and regular rhythm.  Pulmonary/Chest: Effort normal. She has wheezes. She has no rales.  Decreased air entry bilaterally  Abdominal: Soft. Bowel sounds are normal. There is no  tenderness. There is no guarding.  Musculoskeletal: She exhibits edema.  Edema to both legs, stasis changes to both legs, unsteady gait, walker for leg edema  Lymphadenopathy:    She has no cervical adenopathy.  Neurological: She is alert and oriented to person, place, and time.  Skin: Skin is warm and dry. She is not diaphoretic.  Psychiatric: She has  a normal mood and affect.    Labs reviewed: Recent Labs    01/08/17 04/24/17 06/18/17 09/17/17 10/01/17  NA 137  137 140 138  138 137 136  K 3.8  3.8 4.7 4.6  4.6 4.7 4.6  CL 100 100 97  --   --   CO2 30 36 34  --   --   BUN 46* 47* 41*  41* 58* 62*  CREATININE 1.0  1.03 1.2* 1.1  1.09 1.28 1.33  CALCIUM 9.0  9.0 9.0 9.1  9.1 9.0 9.1   Recent Labs    04/24/17 09/17/17  AST 15 15  ALT 11 10  ALKPHOS 106 89  BILITOT  --  0.2  PROT 5.3 5.3  ALBUMIN 3.2 3.3   Recent Labs    12/08/16 04/24/17 06/18/17 09/17/17  WBC 9.1  9.1 7.9 8.5  8.5 9.1  HGB 11.4*  11.4 10.0* 10.9*  10.9 10.4  HCT 34*  33.8 30* 32*  32.2 31.4  MCV  --   --   --  95.7  PLT 235 186 216  --    Lab Results  Component Value Date   TSH 3.48 09/17/2017   Lab Results  Component Value Date   HGBA1C 7.2 07/27/2017   Lab Results  Component Value Date   CHOL 117 12/15/2016   HDL 52 12/15/2016   LDLCALC 40 12/15/2016   TRIG 186 (A) 12/15/2016    Significant Diagnostic Results in last 30 days:  No results found.  Assessment/Plan  1. CKD (chronic kidney disease) stage 3, GFR 30-59 ml/min (HCC) Reviewed renal function and BP readings, monitor  2. Hypertensive heart and kidney disease with chronic combined systolic and diastolic congestive heart failure and stage 3 chronic kidney disease (HCC) cotinue b blocker, ARB, spironolactone and lasix. Monitor bmp. Monitor breathing and weight.   3. Type 2 diabetes with decreased circulation ([place]) Lab Results  Component Value Date   HGBA1C 7.2 07/27/2017   Well controlled blood sugar reading  overall. Monitor. Check a1c. Continue current regimen of lantus and humalog.   4. Chronic obstructive pulmonary disease, unspecified COPD type ([place]) Continue oxygen by nasal canula. Continue bronchodilator and daily prednisone.   5. Non-healing wound of lower extremity, left, sequela Has known history of diabetes but has controlled blood sugar reading. Seen by dermatology yesterday, note pending. Has PVD. With her history of PVD, DM, HLD will obtain ABI to assess her arterial circulation.    Family/ staff Communication: reviewed care plan with patient and charge nurse.    Labs/tests ordered:  ABI   Blanchie Serve, MD Internal Medicine [facility] [address] [place], [place] [address] Cell Phone (Monday-Friday 8 am - 5 pm): [phone] On Call: [phone] and follow prompts after 5 pm and on weekends Office Phone: [phone] Office Fax: [phone]

## 2017-10-15 DIAGNOSIS — R52 Pain, unspecified: Secondary | ICD-10-CM | POA: Diagnosis not present

## 2017-11-10 DIAGNOSIS — S81809D Unspecified open wound, unspecified lower leg, subsequent encounter: Secondary | ICD-10-CM | POA: Diagnosis not present

## 2017-11-10 DIAGNOSIS — L814 Other melanin hyperpigmentation: Secondary | ICD-10-CM | POA: Diagnosis not present

## 2017-11-10 DIAGNOSIS — L570 Actinic keratosis: Secondary | ICD-10-CM | POA: Diagnosis not present

## 2017-11-10 DIAGNOSIS — Z85828 Personal history of other malignant neoplasm of skin: Secondary | ICD-10-CM | POA: Diagnosis not present

## 2017-11-13 ENCOUNTER — Non-Acute Institutional Stay (SKILLED_NURSING_FACILITY): Payer: Medicare Other | Admitting: Family

## 2017-11-13 ENCOUNTER — Encounter: Payer: Self-pay | Admitting: Family

## 2017-11-13 DIAGNOSIS — N183 Chronic kidney disease, stage 3 unspecified: Secondary | ICD-10-CM

## 2017-11-13 DIAGNOSIS — Z794 Long term (current) use of insulin: Secondary | ICD-10-CM

## 2017-11-13 DIAGNOSIS — E1122 Type 2 diabetes mellitus with diabetic chronic kidney disease: Secondary | ICD-10-CM | POA: Diagnosis not present

## 2017-11-13 DIAGNOSIS — I5042 Chronic combined systolic (congestive) and diastolic (congestive) heart failure: Secondary | ICD-10-CM | POA: Diagnosis not present

## 2017-11-13 DIAGNOSIS — I130 Hypertensive heart and chronic kidney disease with heart failure and stage 1 through stage 4 chronic kidney disease, or unspecified chronic kidney disease: Secondary | ICD-10-CM | POA: Diagnosis not present

## 2017-11-13 DIAGNOSIS — J449 Chronic obstructive pulmonary disease, unspecified: Secondary | ICD-10-CM | POA: Diagnosis not present

## 2017-11-13 NOTE — Progress Notes (Signed)
Location:  [facility] Room Number: N 16 Place of Service:  SNF (31) Provider: Ronney Honeywell FNP-C   No primary care provider on file.  Patient Care Team: Clent Jacks, MD (Ophthalmology) Adrian Prows, MD as Attending Physician (Cardiology) [facility] [name], MD as Consulting Physician (Orthopedic Surgery) Clance, Armando Reichert, MD as Consulting Physician (Pulmonary Disease) Jaysa Kise, Nelda Bucks, NP as Nurse Practitioner (Family Medicine) Wardell Honour, MD as Attending Physician [facility] Medicine)  Extended Emergency Contact Information Primary Emergency Contact: Kliebert,[name] Address: [address]          [place], [place] [address] [name] of [place] Phone: [phone] Mobile Phone: [phone] Relation: Son Secondary Emergency Contact: Julson,[name]  [place] Mobile Phone: [phone] Relation: Daughter  Code Status:  DNR Goals of care: Advanced Directive information Advanced Directives 10/14/2017  Does Patient Have a Medical Advance Directive? Yes  Type of Paramedic of [place];Out of facility DNR (pink MOST or yellow form);Living will  Does patient want to make changes to medical advance directive? -  Copy of [place] in Chart? Yes  Pre-existing out of facility DNR order (yellow form or pink MOST form) Yellow form placed in chart (order not valid for inpatient use)     Chief Complaint  Patient presents with  . Medical Management of Chronic Issues    Resident is being seen for a routine visit    HPI:  Pt is a 84 y.o. female seen today [facility] for medical management of chronic diseases.she has a medical history of HTN,Type 2 DM,CHF,COPD,chronic respiratory failure with hypoxia on continuous oxygen via nasal cannula ,OA among other conditions.she is seen in her room today with facility Nurse present at bedside.she denies any acute issues during visit. Her blood  pressure log reviewed readings ranging in the 140's/70's-170's/60's in the morning but stable in the afternoon.she denies any headache,dizziness,nausea,vomiting or faintness.Her CBG reviewed readings controlled in the 120's-170's. She has had no recent fall episode or weight changes.      Past Medical History:  Diagnosis Date  . Abnormality of gait   . Anemia, unspecified   . Arthritis   . Bronchiectasis without acute exacerbation ([place]) 04/10/2011   CT chest 2011   . Cataract 1990   Dr. Katy Fitch  . Cholelithiases   . Closed fracture of unspecified part of ramus of mandible   . COPD (chronic obstructive pulmonary disease) ([place])   . Coronary atherosclerosis of unspecified type of vessel, native or graft   . Disturbance of skin sensation   . DOE (dyspnea on exertion) 04/10/2011   PFTs 2013:  FEV1 1.04 (78%), ratio 64, couldn't do maneuver for lung volumes or dlco   . Edema   . Esophageal reflux   . Fall from other slipping, tripping, or stumbling   . Gastric ulcer with hemorrhage 2008  . Hypertension   . Hypoxemia   . Insomnia, unspecified   . Mucopurulent chronic bronchitis ([place])   . Nodule of neck 11/07/2013   Left neck posteriorly   . Nontoxic uninodular goiter   . Osteoarthrosis, unspecified whether generalized or localized, unspecified site   . Other and unspecified hyperlipidemia   . Other malaise and fatigue   . Pain in joint, lower leg 2010   right knee  . Pain in joint, shoulder region 2013   left  . Palpitations   . Pneumonia, organism unspecified(486)   . Regional enteritis of unspecified  site   . Rosacea   . Sciatica   . Squamous cell carcinoma of skin of lower limb, including hip 07/27/2012   Nonhealing lesion of the left mid calf medially   . Tachycardia 12/06/2014  . Tension headache   . Tension headache   . Type II or unspecified type diabetes mellitus without mention of complication, not stated as uncontrolled   . Unspecified venous (peripheral) insufficiency   .  Urine, incontinence, stress female 07/19/2013   Past Surgical History:  Procedure Laterality Date  . EYE SURGERY Bilateral 1990   Dr. Katy Fitch  . GASTROJEJUNOSTOMY  05/2004   secondary to ulcer  . JOINT REPLACEMENT Bilateral 1993-1998   total knee replacement  . MOLE REMOVAL  05/11/14   left hand and left side of face [facility]  . OTHER SURGICAL HISTORY  2008   Ulcer surgery   . SMALL INTESTINE SURGERY    . TOTAL KNEE ARTHROPLASTY Bilateral 1993, 1998   knees    Allergies  Allergen Reactions  . Adhesive [Tape]     unknown  . Aspirin     unknown  . Augmentin [Amoxicillin-Pot Clavulanate]   . Codeine     unknown  . Diclofenac   . Enalapril Cough  . Hydrocodone     unknown  . Pantoprazole Sodium     unknown  . Voltaren [Diclofenac Sodium]     Allergies as of 11/13/2017      Reactions   Adhesive [tape]    unknown   Aspirin    unknown   Augmentin [amoxicillin-pot Clavulanate]    Codeine    unknown   Diclofenac    Enalapril Cough   Hydrocodone    unknown   Pantoprazole Sodium    unknown   Voltaren [diclofenac Sodium]       Medication List        Accurate as of 11/13/17  1:49 PM. Always use your most recent med list.          acetaminophen 500 MG tablet Commonly known as:  TYLENOL Take 1,000 mg by mouth 2 (two) times daily.   acetaminophen 325 MG tablet Commonly known as:  TYLENOL Take 650 mg by mouth every 6 (six) hours as needed for moderate pain.   Fluticasone-Salmeterol 100-50 MCG/DOSE Aepb Commonly known as:  ADVAIR Inhale 1 puff into the lungs 2 (two) times daily.   furosemide 40 MG tablet Commonly known as:  LASIX Take 40 mg by mouth 2 (two) times daily. Hold for SBP <110   guaiFENesin 600 MG 12 hr tablet Commonly known as:  MUCINEX Take 600 mg by mouth 2 (two) times daily.   HUMALOG 100 UNIT/ML cartridge Generic drug:  insulin lispro Inject 12 Units into the skin. At 1230 PM and 530 PM. (If blood sugar is 150-250 = 12 units, if  blood sugar is greater than 250 = 15 units daily)   insulin glargine 100 UNIT/ML injection Commonly known as:  LANTUS Inject 26 Units into the skin at bedtime.   ipratropium-albuterol 0.5-2.5 (3) MG/3ML Soln Commonly known as:  DUONEB Take 3 mLs by nebulization every 8 (eight) hours as needed.   ipratropium-albuterol 0.5-2.5 (3) MG/3ML Soln Commonly known as:  DUONEB Take 3 mLs by nebulization 2 (two) times daily.   lidocaine 5 % Commonly known as:  LIDODERM Place 1 patch onto the skin daily. Remove & Discard patch within 12 hours or as directed by MD   losartan 100 MG tablet Commonly known as:  COZAAR Take 100 mg daily by mouth.   metoprolol tartrate 50 MG tablet Commonly known as:  LOPRESSOR Take 75 mg by mouth 2 (two) times daily.   mineral oil-hydrophilic petrolatum ointment Apply 1 application daily as needed topically for dry skin (to the left upper arm).   multivitamin with minerals tablet Take 1 tablet daily by mouth.   omeprazole 20 MG capsule Commonly known as:  PRILOSEC Take 20 mg by mouth daily.   OXYGEN Inhale 1.5 L/min into the lungs.   predniSONE 5 MG tablet Commonly known as:  DELTASONE Take 5 mg by mouth daily with breakfast.   spironolactone 25 MG tablet Commonly known as:  ALDACTONE Take 25 mg by mouth daily.   traZODone 50 MG tablet Commonly known as:  DESYREL Take 50 mg by mouth at bedtime.   trolamine salicylate 10 % cream Commonly known as:  ASPERCREME Apply 1 application topically 3 (three) times daily. Both knees       Review of Systems  Constitutional: Negative for appetite change, chills, fatigue, fever and unexpected weight change.  HENT: Positive for hearing loss. Negative for congestion, rhinorrhea, sinus pressure, sinus pain, sneezing, sore throat and trouble swallowing.        Wears left hearing aid   Eyes: Positive for visual disturbance. Negative for discharge and redness.       Wears eye glasses   Respiratory: Negative  for chest tightness.        Chronic respiratory failure with hypoxia on continuous oxygen   Cardiovascular: Positive for leg swelling. Negative for chest pain and palpitations.  Gastrointestinal: Negative for abdominal distention, abdominal pain, constipation, diarrhea, nausea and vomiting.  Endocrine: Negative for cold intolerance, heat intolerance, polydipsia, polyphagia and polyuria.  Genitourinary: Negative for dysuria, flank pain, frequency and urgency.  Musculoskeletal: Positive for arthralgias and gait problem.  Skin: Negative for color change, pallor, rash and wound.  Neurological: Negative for dizziness, light-headedness and headaches.  Hematological: Does not bruise/bleed easily.  Psychiatric/Behavioral: Negative for agitation, confusion and sleep disturbance. The patient is not nervous/anxious.     Immunization History  Administered Date(s) Administered  . Influenza Split 11/06/2011, 11/04/2012  . Influenza,inj,Quad PF,6+ Mos 11/17/2013  . Influenza-Unspecified 11/09/2014, 11/16/2015, 11/25/2015, 11/13/2016  . PPD Test 06/19/2010, 11/29/2014  . Pneumococcal Conjugate-13 08/11/2014  . Pneumococcal Polysaccharide-23 02/04/2003  . Pneumococcal-Unspecified 06/07/2014  . Tdap 02/27/2013  . Zoster 02/03/2006   Pertinent  Health Maintenance Due  Topic Date Due  . OPHTHALMOLOGY EXAM  07/22/2017  . INFLUENZA VACCINE  12/03/2017 (Originally 09/03/2017)  . DEXA SCAN  07/18/2026 (Originally 10/31/1984)  . HEMOGLOBIN A1C  01/26/2018  . FOOT EXAM  06/06/2018  . PNA vac Low Risk Adult  Completed   Fall Risk  02/02/2017 12/06/2014 07/11/2014 05/22/2014 11/07/2013  Falls in the past year? - Yes No Yes No  Number falls in past yr: - 1 - 1 -  Injury with Fall? - No - No -  Risk for fall due to : History of fall(s);Impaired mobility History of fall(s);Impaired mobility - History of fall(s);Impaired balance/gait -  Follow up - - - Falls evaluation completed -    Vitals:   11/13/17 1107  BP:  (!) 164/76  Pulse: 74  Resp: 16  Temp: (!) 97.3 F (36.3 C)  SpO2: 96%  Weight: 215 lb (97.5 kg)  Height: 5\' 7"  (1.702 m)   Body mass index is 33.67 kg/m. Physical Exam  Constitutional: She is oriented to person, place, and time.  Obese  elderly,in no acute distress   HENT:  Head: Normocephalic.  Right Ear: External ear normal.  Left Ear: External ear normal.  Mouth/Throat: Oropharynx is clear and moist. No oropharyngeal exudate.  Left hearing aid in place   Eyes: Pupils are equal, round, and reactive to light. Conjunctivae and EOM are normal. Right eye exhibits no discharge. Left eye exhibits no discharge. No scleral icterus.  Corrective lens in place   Neck: Normal range of motion. No JVD present. No thyromegaly present.  Cardiovascular: Normal rate, regular rhythm, normal heart sounds and intact distal pulses. Exam reveals no gallop and no friction rub.  No murmur heard. Pulmonary/Chest: Effort normal. No respiratory distress. She has no rales.  Poor air entry on continuous oxygen via nasal cannula.  Abdominal: Soft. Bowel sounds are normal. She exhibits no distension and no mass. There is no tenderness. There is no rebound and no guarding.  Musculoskeletal:  Moves x 4 extremities except limited left shoulder ROM due to chronic arthritic pain.unsteady gait ambulates with walker for short distance and uses scooter for long distance.Bilateral lower extremities has trace edema.    Lymphadenopathy:    She has no cervical adenopathy.  Neurological: She is oriented to person, place, and time. Gait abnormal.  Skin: Skin is warm and dry. No rash noted. No erythema. No pallor.  Psychiatric: She has a normal mood and affect. Her speech is normal and behavior is normal. Judgment and thought content normal.  Nursing note and vitals reviewed.   Labs reviewed: Recent Labs    01/08/17 04/24/17 06/18/17 09/17/17 10/01/17  NA 137  137 140 138  138 137 136  K 3.8  3.8 4.7 4.6  4.6 4.7  4.6  CL 100 100 97  --   --   CO2 30 36 34  --   --   BUN 46* 47* 41*  41* 58* 62*  CREATININE 1.0  1.03 1.2* 1.1  1.09 1.28 1.33  CALCIUM 9.0  9.0 9.0 9.1  9.1 9.0 9.1   Recent Labs    04/24/17 09/17/17  AST 15 15  ALT 11 10  ALKPHOS 106 89  BILITOT  --  0.2  PROT 5.3 5.3  ALBUMIN 3.2 3.3   Recent Labs    12/08/16 04/24/17 06/18/17 09/17/17  WBC 9.1  9.1 7.9 8.5  8.5 9.1  HGB 11.4*  11.4 10.0* 10.9*  10.9 10.4  HCT 34*  33.8 30* 32*  32.2 31.4  MCV  --   --   --  95.7  PLT 235 186 216  --    Lab Results  Component Value Date   TSH 3.48 09/17/2017   Lab Results  Component Value Date   HGBA1C 7.2 07/27/2017   Lab Results  Component Value Date   CHOL 117 12/15/2016   HDL 52 12/15/2016   LDLCALC 40 12/15/2016   TRIG 186 (A) 12/15/2016    Significant Diagnostic Results in last 30 days:  No results found.  Assessment/Plan 1. Hypertensive heart and kidney disease with chronic combined systolic and diastolic congestive heart failure and stage 3 chronic kidney disease ([place]) Blood pressure reviewed SBP 140's-170's.Change Losartan from 100 mg daily  to 50 mg tablet twice daily.continue on aldactone 25 mg tablet,metoprolol 75 mg tablet twice daily and furosemide 40 mg tablet twice daily.off ASA and statin due to advance age and high risk for falls.monitor BMP.  2. Chronic combined systolic and diastolic congestive heart failure ([facility]) Stable.no abrupt weight changes.Shortness of breath with  exertion also due to her chronic respiratory failure.continue on Furosemide 40 mg table twice daily,aldactone 25 mg tablet and metoprolol 75 mg tablet twice daily.continue to monitor weight.    3. Chronic obstructive pulmonary disease, unspecified COPD type ([place]) Breathing stable.continue on continuous oxygen via nasal cannula,DuoNeb and Adavir.  4. Controlled type 2 diabetes mellitus with stage 3 chronic kidney disease, with long-term current use of insulin ([place]) Lab Results    Component Value Date   HGBA1C 7.2 07/27/2017  CBG controlled.continue on Lantus 26 units daily at bedtime and Humalog 12 units three times with meals.continue to monitor for symptoms of  Hypo/hyperglycemia.  Family/ staff Communication: Reviewed plan of care with patient and facility Nurse.   Labs/tests ordered: None   Coady Train C Candise Crabtree, NP

## 2017-12-14 ENCOUNTER — Non-Acute Institutional Stay (SKILLED_NURSING_FACILITY): Payer: Medicare Other | Admitting: Family

## 2017-12-14 ENCOUNTER — Encounter: Payer: Self-pay | Admitting: Family

## 2017-12-14 DIAGNOSIS — Z794 Long term (current) use of insulin: Secondary | ICD-10-CM

## 2017-12-14 DIAGNOSIS — I5042 Chronic combined systolic (congestive) and diastolic (congestive) heart failure: Secondary | ICD-10-CM | POA: Diagnosis not present

## 2017-12-14 DIAGNOSIS — E1122 Type 2 diabetes mellitus with diabetic chronic kidney disease: Secondary | ICD-10-CM | POA: Diagnosis not present

## 2017-12-14 DIAGNOSIS — I130 Hypertensive heart and chronic kidney disease with heart failure and stage 1 through stage 4 chronic kidney disease, or unspecified chronic kidney disease: Secondary | ICD-10-CM | POA: Diagnosis not present

## 2017-12-14 DIAGNOSIS — G4700 Insomnia, unspecified: Secondary | ICD-10-CM

## 2017-12-14 DIAGNOSIS — N183 Chronic kidney disease, stage 3 unspecified: Secondary | ICD-10-CM

## 2017-12-14 DIAGNOSIS — J449 Chronic obstructive pulmonary disease, unspecified: Secondary | ICD-10-CM

## 2017-12-14 DIAGNOSIS — E669 Obesity, unspecified: Secondary | ICD-10-CM

## 2017-12-15 DIAGNOSIS — S81809D Unspecified open wound, unspecified lower leg, subsequent encounter: Secondary | ICD-10-CM | POA: Diagnosis not present

## 2017-12-15 DIAGNOSIS — L570 Actinic keratosis: Secondary | ICD-10-CM | POA: Diagnosis not present

## 2017-12-15 DIAGNOSIS — L814 Other melanin hyperpigmentation: Secondary | ICD-10-CM | POA: Diagnosis not present

## 2017-12-15 DIAGNOSIS — L821 Other seborrheic keratosis: Secondary | ICD-10-CM | POA: Diagnosis not present

## 2017-12-15 NOTE — Progress Notes (Addendum)
Location:  [facility] Room Number: N16A Place of Service:  SNF (31) Provider: Dinah Ngetich FNP-C   Ngetich, Nelda Bucks, NP  Patient Care Team: Ngetich, Nelda Bucks, NP as PCP - General (Family Medicine) Clent Jacks, MD (Ophthalmology) Adrian Prows, MD as Attending Physician (Cardiology) [place] [name], MD as Consulting Physician (Orthopedic Surgery) Clance, Armando Reichert, MD as Consulting Physician (Pulmonary Disease) Wardell Honour, MD as Attending Physician [facility] Medicine)  Extended Emergency Contact Information Primary Emergency Contact: Bellanca,[name] Address: [name]          [place], [place] [address] [name] of [place] Phone: [phone] Mobile Phone: [phone] Relation: Son Secondary Emergency Contact: [place] Phone: [phone] Relation: Daughter  Code Status: DNR  Goals of care: Advanced Directive information Advanced Directives 12/14/2017  Does Patient Have a Medical Advance Directive? Yes  Type of Advance Directive Out of facility DNR (pink MOST or yellow form);[place];Living will  Does patient want to make changes to medical advance directive? No - Patient declined  Copy of [place] in Chart? Yes - validated most recent copy scanned in chart (See row information)  Pre-existing out of facility DNR order (yellow form or pink MOST form) Yellow form placed in chart (order not valid for inpatient use)     Chief Complaint  Patient presents with  . Medical Management of Chronic Issues    Routine Visit    HPI:  Pt is a 84 y.o. female seen today [place] for medical management of chronic diseases.she has a medical history of HTN,Type 2 DM, chronic diastolic and systolic CHF,COPD on continuous oxygen,insomnia,OAB,OA among other conditions.she is seen in her room today.she denies any acute issues during the visit.she states her  breathing has been stable except when she takes off her oxygen to use the bathroom or during morning care gets more short of breath.she spends most time on wheelchair but uses scooter for long distance.she prefers to sleep on her electric recliner during the night for easy getting in and out.she has had no recent fall episodes.Describes her appetite as good.Her weight log reviewed a 2 lbs weight gain over the past one month.no skin breakdown.Facility Nurse reports no new concerns.       Past Medical History:  Diagnosis Date  . Abnormality of gait   . Anemia, unspecified   . Arthritis   . Bronchiectasis without acute exacerbation ([place]) 04/10/2011   CT chest 2011   . Cataract 1990   Dr. Katy Fitch  . Cholelithiases   . Closed fracture of unspecified part of ramus of mandible   . COPD (chronic obstructive pulmonary disease) ([place])   . Coronary atherosclerosis of unspecified type of vessel, native or graft   . Disturbance of skin sensation   . DOE (dyspnea on exertion) 04/10/2011   PFTs 2013:  FEV1 1.04 (78%), ratio 64, couldn't do maneuver for lung volumes or dlco   . Edema   . Esophageal reflux   . Fall from other slipping, tripping, or stumbling   . Gastric ulcer with hemorrhage 2008  . Hypertension   . Hypoxemia   . Insomnia, unspecified   . Mucopurulent chronic bronchitis ([place])   . Nodule of neck 11/07/2013   Left neck posteriorly   . Nontoxic uninodular goiter   . Osteoarthrosis, unspecified whether generalized or localized, unspecified site   . Other and unspecified hyperlipidemia   . Other malaise  and fatigue   . Pain in joint, lower leg 2010   right knee  . Pain in joint, shoulder region 2013   left  . Palpitations   . Pneumonia, organism unspecified(486)   . Regional enteritis of unspecified site   . Rosacea   . Sciatica   . Squamous cell carcinoma of skin of lower limb, including hip 07/27/2012   Nonhealing lesion of the left mid calf medially   . Tachycardia 12/06/2014  .  Tension headache   . Tension headache   . Type II or unspecified type diabetes mellitus without mention of complication, not stated as uncontrolled   . Unspecified venous (peripheral) insufficiency   . Urine, incontinence, stress female 07/19/2013   Past Surgical History:  Procedure Laterality Date  . EYE SURGERY Bilateral 1990   Dr. Katy Fitch  . GASTROJEJUNOSTOMY  05/2004   secondary to ulcer  . JOINT REPLACEMENT Bilateral 1993-1998   total knee replacement  . MOLE REMOVAL  05/11/14   left hand and left side of face [facility]  . OTHER SURGICAL HISTORY  2008   Ulcer surgery   . SMALL INTESTINE SURGERY    . TOTAL KNEE ARTHROPLASTY Bilateral 1993, 1998   knees    Allergies  Allergen Reactions  . Adhesive [Tape]     unknown  . Aspirin     unknown  . Augmentin [Amoxicillin-Pot Clavulanate]   . Codeine     unknown  . Diclofenac   . Enalapril Cough  . Hydrocodone     unknown  . Pantoprazole Sodium     unknown  . Voltaren [Diclofenac Sodium]     Allergies as of 12/14/2017      Reactions   Adhesive [tape]    unknown   Aspirin    unknown   Augmentin [amoxicillin-pot Clavulanate]    Codeine    unknown   Diclofenac    Enalapril Cough   Hydrocodone    unknown   Pantoprazole Sodium    unknown   Voltaren [diclofenac Sodium]       Medication List        Accurate as of 12/14/17 11:59 PM. Always use your most recent med list.          acetaminophen 500 MG tablet Commonly known as:  TYLENOL Take 1,000 mg by mouth 2 (two) times daily.   Fluticasone-Salmeterol 100-50 MCG/DOSE Aepb Commonly known as:  ADVAIR Inhale 1 puff into the lungs 2 (two) times daily.   furosemide 40 MG tablet Commonly known as:  LASIX Take 40 mg by mouth 2 (two) times daily. Hold for SBP <110   guaiFENesin 600 MG 12 hr tablet Commonly known as:  MUCINEX Take 600 mg by mouth 2 (two) times daily.   HUMALOG 100 UNIT/ML cartridge Generic drug:  insulin lispro Inject 8-12 Units  into the skin daily. At 1230 PM and 530 PM.If Blood Sugar is 150 to 250, give 8 Units.If Blood Sugar is greater than 250, give 12 Units. subcutaneous   insulin lispro 100 UNIT/ML injection Commonly known as:  HUMALOG Inject 12-15 Units into the skin 2 (two) times daily between meals. give 12 units if CBG between 150-250 give 15 units if CBG > 250   insulin glargine 100 UNIT/ML injection Commonly known as:  LANTUS Inject 26 Units into the skin at bedtime.   ipratropium-albuterol 0.5-2.5 (3) MG/3ML Soln Commonly known as:  DUONEB Take 3 mLs by nebulization every 8 (eight) hours as needed.   ipratropium-albuterol 0.5-2.5 (  3) MG/3ML Soln Commonly known as:  DUONEB Take 3 mLs by nebulization 2 (two) times daily.   lidocaine 5 % Commonly known as:  LIDODERM Place 1 patch onto the skin daily. Apply to left neck for pain Remove & Discard patch within 12 hours or as directed by MD   losartan 50 MG tablet Commonly known as:  COZAAR Take 50 mg by mouth 2 (two) times daily.   metoprolol tartrate 50 MG tablet Commonly known as:  LOPRESSOR Take 75 mg by mouth 2 (two) times daily. hold medication if SBP < 110 OR HR < 60 b/min   mineral oil-hydrophilic petrolatum ointment Apply 1 application daily as needed topically for dry skin (to the left upper arm).   multivitamin with minerals tablet Take 1 tablet daily by mouth.   omeprazole 20 MG capsule Commonly known as:  PRILOSEC Take 20 mg by mouth daily.   OXYGEN Inhale 1.5 L/min into the lungs.   predniSONE 5 MG tablet Commonly known as:  DELTASONE Take 5 mg by mouth daily with breakfast.   spironolactone 25 MG tablet Commonly known as:  ALDACTONE Take 25 mg by mouth daily.   traZODone 50 MG tablet Commonly known as:  DESYREL Take 50 mg by mouth at bedtime.   trolamine salicylate 10 % cream Commonly known as:  ASPERCREME Apply 1 application topically 3 (three) times daily. Apply to right knee   zinc oxide 20 % ointment Apply  1 application topically as needed for irritation. Apply to buttocks as needed       Review of Systems  Constitutional: Negative for appetite change, chills, fatigue and fever.  HENT: Positive for hearing loss. Negative for congestion, rhinorrhea, sinus pressure, sinus pain, sneezing, sore throat and trouble swallowing.   Eyes: Positive for visual disturbance. Negative for discharge, redness and itching.       Wears eye glasses has not had annual eye exam but declines referral.  Respiratory: Negative for chest tightness.        Chronic cough and shortness of breath  Cardiovascular: Negative for chest pain, palpitations and leg swelling.  Gastrointestinal: Negative for abdominal distention, abdominal pain, constipation, diarrhea, nausea and vomiting.  Endocrine: Negative for cold intolerance, heat intolerance, polydipsia, polyphagia and polyuria.  Genitourinary: Negative for dysuria, flank pain and urgency.  Musculoskeletal: Positive for arthralgias and gait problem.       No recent fall episodes   Skin: Negative for color change, pallor, rash and wound.  Neurological: Negative for dizziness, light-headedness and headaches.  Hematological: Does not bruise/bleed easily.  Psychiatric/Behavioral: Negative for agitation and sleep disturbance. The patient is not nervous/anxious.        Requires trazodone at time for sleep.Gets up several times at night but states able to sleep go back to sleep.      Immunization History  Administered Date(s) Administered  . Influenza Split 11/06/2011, 11/04/2012  . Influenza,inj,Quad PF,6+ Mos 11/17/2013  . Influenza-Unspecified 11/09/2014, 11/16/2015, 11/25/2015, 11/13/2016, 11/16/2017  . PPD Test 06/19/2010, 11/29/2014  . Pneumococcal Conjugate-13 08/11/2014  . Pneumococcal Polysaccharide-23 02/04/2003  . Pneumococcal-Unspecified 06/07/2014  . Tdap 02/27/2013  . Zoster 02/03/2006   Pertinent  Health Maintenance Due  Topic Date Due  . OPHTHALMOLOGY  EXAM  07/22/2017  . DEXA SCAN  07/18/2026 (Originally 10/31/1984)  . HEMOGLOBIN A1C  01/26/2018  . FOOT EXAM  06/06/2018  . INFLUENZA VACCINE  Completed  . PNA vac Low Risk Adult  Completed   Fall Risk  02/02/2017 12/06/2014 07/11/2014 05/22/2014 11/07/2013  Falls in the past year? - Yes No Yes No  Number falls in past yr: - 1 - 1 -  Injury with Fall? - No - No -  Risk for fall due to : History of fall(s);Impaired mobility History of fall(s);Impaired mobility - History of fall(s);Impaired balance/gait -  Follow up - - - Falls evaluation completed -    Vitals:   12/14/17 1324  BP: (!) 167/74  Pulse: 64  Resp: 18  Temp: 98.8 F (37.1 C)  TempSrc: Oral  SpO2: 95%  Weight: 217 lb (98.4 kg)  Height: 5\' 7"  (1.702 m)   Body mass index is 33.99 kg/m. Physical Exam  Constitutional: She is oriented to person, place, and time.  Obese elderly in no acute distress  HENT:  Head: Normocephalic.  Right Ear: External ear normal.  Left Ear: External ear normal.  Mouth/Throat: Oropharynx is clear and moist. No oropharyngeal exudate.  Left hearing aid in place   Eyes: Pupils are equal, round, and reactive to light. Conjunctivae and EOM are normal. Right eye exhibits no discharge. Left eye exhibits no discharge. No scleral icterus.  Corrective lens in place   Neck: Normal range of motion. No JVD present. No thyromegaly present.  Cardiovascular: Normal rate, regular rhythm, normal heart sounds and intact distal pulses. Exam reveals no gallop and no friction rub.  No murmur heard. Pulmonary/Chest: Effort normal and breath sounds normal. No respiratory distress. She has no wheezes. She has no rales.  Abdominal: Soft. Bowel sounds are normal. She exhibits no distension and no mass. There is no tenderness. There is no rebound and no guarding.  Musculoskeletal: She exhibits no edema or tenderness.  Moves x 4 extremities except chronic limited ROM to left shoulder joint due to arthritis.unsteady gait  ambulates to the bathroom with walker otherwise self propel on wheelchair and uses scooter for long distance.   Lymphadenopathy:    She has no cervical adenopathy.  Neurological: She is oriented to person, place, and time. Gait abnormal.  HOH hearing to left ear   Skin: Skin is warm and dry. No rash noted. No erythema. No pallor.  Psychiatric: She has a normal mood and affect. Her speech is normal and behavior is normal. Judgment and thought content normal.  Nursing note and vitals reviewed.   Labs reviewed: Recent Labs    01/08/17 04/24/17 06/18/17 09/17/17 10/01/17  NA 137  137 140 138  138 137 136  K 3.8  3.8 4.7 4.6  4.6 4.7 4.6  CL 100 100 97  --   --   CO2 30 36 34  --   --   BUN 46* 47* 41*  41* 58* 62*  CREATININE 1.0  1.03 1.2* 1.1  1.09 1.28 1.33  CALCIUM 9.0  9.0 9.0 9.1  9.1 9.0 9.1   Recent Labs    04/24/17 09/17/17  AST 15 15  ALT 11 10  ALKPHOS 106 89  BILITOT  --  0.2  PROT 5.3 5.3  ALBUMIN 3.2 3.3   Recent Labs    04/24/17 06/18/17 09/17/17  WBC 7.9 8.5  8.5 9.1  HGB 10.0* 10.9*  10.9 10.4  HCT 30* 32*  32.2 31.4  MCV  --   --  95.7  PLT 186 216  --    Lab Results  Component Value Date   TSH 3.48 09/17/2017   Lab Results  Component Value Date   HGBA1C 7.2 07/27/2017   Lab Results  Component Value Date  CHOL 117 12/15/2016   HDL 52 12/15/2016   LDLCALC 40 12/15/2016   TRIG 186 (A) 12/15/2016    Significant Diagnostic Results in last 30 days:  No results found.  Assessment/Plan 1. Controlled type 2 diabetes mellitus with stage 3 chronic kidney disease, with long-term current use of insulin (HCC) Lab Results  Component Value Date   HGBA1C 7.2 07/27/2017   CBG log reviewed readings in the 110's-160's.continue on lantus 26 units at bedtime,Humalog 8 units for CBG>150-250 and 12 units for CBG> 250 at 12:30 and 17:30pm.current off ASA (allergy) and Statin due to advance age and high risk for falls.upto date with annual foot exam  last seen by podiatrist 08/2017 thick toenails debrided.On losartan for HTN and renal protections. Continue to monitor CBG and encourage heart healthy snacks. Hgb A1C 12/17/2017      2. Hypertensive heart and kidney disease with chronic combined systolic and diastolic congestive heart failure and stage 3 chronic kidney disease (HCC) B/p log reviewed overall readings in the 130's/50's-150's/60's with sporadic SBP > 160 in the morning but normal readings in the afternoons.will continue on spironolactone 25 mg tablet daily,metoprolol 75 mg tablet twice daily,losartan 50 mg tablet twice daily and Furosemide 40 mg tablet twice daily.monitor BMP.   3. Chronic combined systolic and diastolic congestive heart failure ([place]) Has had a 2 lbs weight gain over one month.No worsening cough,shortness of breath,wheezing or edema.chronic shortness of breath with exertion not changed.continue on furosemide 40 mg tablet twice daily,spironolactone 25 mg tablet daily,metoprolol 75 mg tablet twice daily and losartan 50 mg tablet twice daily.continue to monitor weight.   4. Chronic obstructive pulmonary disease, unspecified COPD type ([place]) Breathing stable at rest but has shortness of breath with exertion which could a combination of her respiratory failure and congestive heart failure.continue on prednisone 5 mg tablet daily,Advair 100-50 mcg/dose one puff twice daily,Duoneb twice daily and every 8 hours as needed.continue on continuous oxygen 2 liters via nasal cannula.    5. Obesity BMI 33.99  Encourage heart healthy diet/snacks and participate in facility activities.   6. Insomnia  Trazodone effective unable to reduce dose for now.continue to monitor.   Family/ staff Communication: Reviewed plan of care with patient and facility Nurse.   Labs/tests ordered: Hgb A1C 12/17/2017   Sandrea Hughs, NP

## 2017-12-17 DIAGNOSIS — E1122 Type 2 diabetes mellitus with diabetic chronic kidney disease: Secondary | ICD-10-CM | POA: Diagnosis not present

## 2017-12-17 LAB — HEMOGLOBIN A1C: Hemoglobin A1C: 6.9

## 2018-01-08 DIAGNOSIS — E1159 Type 2 diabetes mellitus with other circulatory complications: Secondary | ICD-10-CM | POA: Diagnosis not present

## 2018-01-08 DIAGNOSIS — L84 Corns and callosities: Secondary | ICD-10-CM | POA: Diagnosis not present

## 2018-01-08 DIAGNOSIS — B351 Tinea unguium: Secondary | ICD-10-CM | POA: Diagnosis not present

## 2018-01-12 ENCOUNTER — Encounter: Payer: Self-pay | Admitting: Family Medicine

## 2018-01-12 ENCOUNTER — Non-Acute Institutional Stay (SKILLED_NURSING_FACILITY): Payer: Medicare Other | Admitting: Family Medicine

## 2018-01-12 DIAGNOSIS — J449 Chronic obstructive pulmonary disease, unspecified: Secondary | ICD-10-CM

## 2018-01-12 DIAGNOSIS — I5042 Chronic combined systolic (congestive) and diastolic (congestive) heart failure: Secondary | ICD-10-CM

## 2018-01-12 DIAGNOSIS — N183 Chronic kidney disease, stage 3 unspecified: Secondary | ICD-10-CM

## 2018-01-12 DIAGNOSIS — E1151 Type 2 diabetes mellitus with diabetic peripheral angiopathy without gangrene: Secondary | ICD-10-CM | POA: Diagnosis not present

## 2018-01-12 NOTE — Progress Notes (Signed)
Provider:  Alain Honey, MD Location:  [facility] Room Number: 16 Place of Service:  SNF (31)  PCP: Ngetich, Nelda Bucks, NP Patient Care Team: Ngetich, Nelda Bucks, NP as PCP - General (Family Medicine) Clent Jacks, MD (Ophthalmology) Adrian Prows, MD as Attending Physician (Cardiology) [place] [name], MD as Consulting Physician (Orthopedic Surgery) Clance, Armando Reichert, MD as Consulting Physician (Pulmonary Disease) Wardell Honour, MD as Attending Physician [facility] Medicine)  Extended Emergency Contact Information Primary Emergency Contact: Shrout,[name] Address: [address]          [place], [place] [address] [name] of [place] Phone: [phone] Mobile Phone: [phone] Relation: Son Secondary Emergency Contact: [place] Mobile Phone: [phone] Relation: Daughter  Code Status: DNR Goals of Care: Advanced Directive information Advanced Directives 01/12/2018  Does Patient Have a Medical Advance Directive? Yes  Type of Paramedic of [place];Out of facility DNR (pink MOST or yellow form)  Does patient want to make changes to medical advance directive? No - Patient declined  Copy of [place] in Chart? Yes - validated most recent copy scanned in chart (See row information)  Pre-existing out of facility DNR order (yellow form or pink MOST form) Yellow form placed in chart (order not valid for inpatient use)      Chief Complaint  Patient presents with  . Medical Management of Chronic Issues    Routine visit     HPI: Patient is a 84 y.o. female seen today for medical management of chronic problems including: COPD, diabetes, hypertension,.  Her diabetes is managed basal as well as short acting insulin.  Most recent A1c was 6.4-month ago. Regarding her COPD.  She is fairly oxygen dependent and also uses neb treatments.  She admits that she cannot tell  much difference if she does not take the neb treatment but it does help wheezing sometimes. For blood pressure she takes an ARB as well as metoprolol.  Pressures have been slightly elevated but within acceptable range for someone of her advanced age.  She has had no falls recently.  She sleeps in a [employer].  Past Medical History:  Diagnosis Date  . Abnormality of gait   . Anemia, unspecified   . Arthritis   . Bronchiectasis without acute exacerbation ([place]) 04/10/2011   CT chest 2011   . Cataract 1990   Dr. Katy Fitch  . Cholelithiases   . Closed fracture of unspecified part of ramus of mandible   . COPD (chronic obstructive pulmonary disease) ([place])   . Coronary atherosclerosis of unspecified type of vessel, native or graft   . Disturbance of skin sensation   . DOE (dyspnea on exertion) 04/10/2011   PFTs 2013:  FEV1 1.04 (78%), ratio 64, couldn't do maneuver for lung volumes or dlco   . Edema   . Esophageal reflux   . Fall from other slipping, tripping, or stumbling   . Gastric ulcer with hemorrhage 2008  . Hypertension   . Hypoxemia   . Insomnia, unspecified   . Mucopurulent chronic bronchitis ([place])   . Nodule of neck 11/07/2013   Left neck posteriorly   . Nontoxic uninodular goiter   . Osteoarthrosis, unspecified whether generalized or localized, unspecified site   . Other and unspecified hyperlipidemia   . Other malaise and fatigue   . Pain in joint, lower leg 2010   right knee  . Pain in joint, shoulder region 2013  left  . Palpitations   . Pneumonia, organism unspecified(486)   . Regional enteritis of unspecified site   . Rosacea   . Sciatica   . Squamous cell carcinoma of skin of lower limb, including hip 07/27/2012   Nonhealing lesion of the left mid calf medially   . Tachycardia 12/06/2014  . Tension headache   . Tension headache   . Type II or unspecified type diabetes mellitus without mention of complication, not stated as uncontrolled   . Unspecified venous (peripheral)  insufficiency   . Urine, incontinence, stress female 07/19/2013   Past Surgical History:  Procedure Laterality Date  . EYE SURGERY Bilateral 1990   Dr. Katy Fitch  . GASTROJEJUNOSTOMY  05/2004   secondary to ulcer  . JOINT REPLACEMENT Bilateral 1993-1998   total knee replacement  . MOLE REMOVAL  05/11/14   left hand and left side of face [facility]  . OTHER SURGICAL HISTORY  2008   Ulcer surgery   . SMALL INTESTINE SURGERY    . TOTAL KNEE ARTHROPLASTY Bilateral 1993, 1998   knees    reports that she quit smoking about 44 years ago. Her smoking use included cigarettes. She has a 3.00 pack-year smoking history. She has never used smokeless tobacco. She reports that she does not drink alcohol or use drugs. Social History   Socioeconomic History  . Marital status: Widowed    Spouse name: Not on file  . Number of children: 2  . Years of education: Not on file  . Highest education level: Not on file  Occupational History  . Occupation: retired Agricultural engineer.   Social Needs  . Financial resource strain: Not on file  . Food insecurity:    Worry: Not on file    Inability: Not on file  . Transportation needs:    Medical: Not on file    Non-medical: Not on file  Tobacco Use  . Smoking status: Former Smoker    Packs/day: 0.30    Years: 10.00    Pack years: 3.00    Types: Cigarettes    Last attempt to quit: 02/03/1973    Years since quitting: 44.9  . Smokeless tobacco: Never Used  . Tobacco comment: former casual smoker, lots of second hand smoke exposure  Substance and Sexual Activity  . Alcohol use: No  . Drug use: No  . Sexual activity: Never  Lifestyle  . Physical activity:    Days per week: Not on file    Minutes per session: Not on file  . Stress: Not on file  Relationships  . Social connections:    Talks on phone: Not on file    Gets together: Not on file    Attends religious service: Not on file    Active member of club or organization: Not on file    Attends  meetings of clubs or organizations: Not on file    Relationship status: Not on file  . Intimate partner violence:    Fear of current or ex partner: Not on file    Emotionally abused: Not on file    Physically abused: Not on file    Forced sexual activity: Not on file  Other Topics Concern  . Not on file  Social History Narrative   Pt lives at [facility] in the independent living section since 1994. Moved to [place] 12/08/2013. Moved to skill unit 11/29/14   Widowed   Has a living will, POA, DNR   Power scooter and rolling walker  Exercise none   Never smoked   Alcohol none                 Functional Status Survey:    Family History  Problem Relation Age of Onset  . Heart disease Father   . Colon cancer Sister   . Diabetes Brother   . Obesity Brother   . Mental retardation Daughter   . Obesity Daughter     Health Maintenance  Topic Date Due  . OPHTHALMOLOGY EXAM  12/16/2018 (Originally 07/22/2017)  . DEXA SCAN  07/18/2026 (Originally 10/31/1984)  . FOOT EXAM  06/06/2018  . HEMOGLOBIN A1C  06/17/2018  . TETANUS/TDAP  02/28/2023  . INFLUENZA VACCINE  Completed  . PNA vac Low Risk Adult  Completed    Allergies  Allergen Reactions  . Adhesive [Tape]     unknown  . Aspirin     unknown  . Augmentin [Amoxicillin-Pot Clavulanate]   . Codeine     unknown  . Diclofenac   . Enalapril Cough  . Hydrocodone     unknown  . Pantoprazole Sodium     unknown  . Voltaren [Diclofenac Sodium]     Outpatient Encounter Medications as of 01/12/2018  Medication Sig  . acetaminophen (TYLENOL) 500 MG tablet Take 1,000 mg by mouth 2 (two) times daily.   . Fluticasone-Salmeterol (ADVAIR) 100-50 MCG/DOSE AEPB Inhale 1 puff into the lungs 2 (two) times daily.  . furosemide (LASIX) 40 MG tablet Take 40 mg by mouth 2 (two) times daily. Hold for SBP <110  . guaiFENesin (MUCINEX) 600 MG 12 hr tablet Take 600 mg by mouth 2 (two) times daily.  . insulin glargine (LANTUS) 100 UNIT/ML  injection Inject 26 Units into the skin at bedtime.   . insulin lispro (HUMALOG) 100 UNIT/ML cartridge Inject 8-12 Units into the skin daily. At 1230 PM and 530 PM.If Blood Sugar is 150 to 250, give 8 Units.If Blood Sugar is greater than 250, give 12 Units. subcutaneous  . insulin lispro (HUMALOG) 100 UNIT/ML injection Inject 12-15 Units into the skin 2 (two) times daily between meals. give 12 units if CBG between 150-250 give 15 units if CBG > 250  . ipratropium-albuterol (DUONEB) 0.5-2.5 (3) MG/3ML SOLN Take 3 mLs by nebulization every 8 (eight) hours as needed.   Marland Kitchen ipratropium-albuterol (DUONEB) 0.5-2.5 (3) MG/3ML SOLN Take 3 mLs by nebulization 2 (two) times daily.  Marland Kitchen lidocaine (LIDODERM) 5 % Place 1 patch onto the skin daily. Apply to left neck for pain Remove & Discard patch within 12 hours or as directed by MD  . losartan (COZAAR) 50 MG tablet Take 50 mg by mouth 2 (two) times daily.  . metoprolol tartrate (LOPRESSOR) 50 MG tablet Take 75 mg by mouth 2 (two) times daily. hold medication if SBP < 110 OR HR < 60 b/min  . mineral oil-hydrophilic petrolatum (AQUAPHOR) ointment Apply 1 application daily as needed topically for dry skin (to the left upper arm).  Marland Kitchen omeprazole (PRILOSEC) 20 MG capsule Take 20 mg by mouth daily.   . OXYGEN Inhale 1.5 L/min into the lungs.   . predniSONE (DELTASONE) 5 MG tablet Take 5 mg by mouth daily with breakfast.  . spironolactone (ALDACTONE) 25 MG tablet Take 25 mg by mouth daily.   . traZODone (DESYREL) 50 MG tablet Take 50 mg by mouth at bedtime.  . trolamine salicylate (ASPERCREME) 10 % cream Apply 1 application topically 3 (three) times daily. Apply to right knee  . zinc oxide  20 % ointment Apply 1 application topically as needed for irritation. Apply to buttocks as needed  . [DISCONTINUED] Multiple Vitamins-Minerals (MULTIVITAMIN WITH MINERALS) tablet Take 1 tablet daily by mouth.   No facility-administered encounter medications on file as of 01/12/2018.       Review of Systems  Constitutional: Negative.   HENT: Positive for hearing loss.   Respiratory: Positive for shortness of breath and wheezing.   Cardiovascular: Negative.   Gastrointestinal: Negative.   Endocrine: Negative.   Genitourinary: Negative.   Musculoskeletal: Positive for gait problem.  Psychiatric/Behavioral: Negative.     Vitals:   01/12/18 1110  BP: (!) 161/76  Pulse: 68  Resp: 20  Temp: (!) 97.2 F (36.2 C)  SpO2: 96%  Weight: 215 lb (97.5 kg)  Height: 5\' 7"  (1.702 m)   Body mass index is 33.67 kg/m. Physical Exam  Constitutional: She appears well-developed and well-nourished.  HENT:  Head: Normocephalic and atraumatic.  Mouth/Throat: Oropharynx is clear and moist.  Eyes: Pupils are equal, round, and reactive to light. EOM are normal.  Neck: Normal range of motion.  Cardiovascular: Normal rate, regular rhythm and normal heart sounds.  Pulmonary/Chest: Effort normal and breath sounds normal.  Abdominal: Soft.  Musculoskeletal: Normal range of motion.  Uses rolling walker for ambulation  Neurological: She is alert.  Skin: Skin is warm.  Psychiatric: She has a normal mood and affect. Her behavior is normal. Thought content normal.  Nursing note and vitals reviewed.   Labs reviewed: Basic Metabolic Panel: Recent Labs    04/24/17 06/18/17 09/17/17 10/01/17  NA 140 138  138 137 136  K 4.7 4.6  4.6 4.7 4.6  CL 100 97  --   --   CO2 36 34  --   --   BUN 47* 41*  41* 58* 62*  CREATININE 1.2* 1.1  1.09 1.28 1.33  CALCIUM 9.0 9.1  9.1 9.0 9.1   Liver Function Tests: Recent Labs    04/24/17 09/17/17  AST 15 15  ALT 11 10  ALKPHOS 106 89  BILITOT  --  0.2  PROT 5.3 5.3  ALBUMIN 3.2 3.3   No results for input(s): LIPASE, AMYLASE in the last 8760 hours. No results for input(s): AMMONIA in the last 8760 hours. CBC: Recent Labs    04/24/17 06/18/17 09/17/17  WBC 7.9 8.5  8.5 9.1  HGB 10.0* 10.9*  10.9 10.4  HCT 30* 32*  32.2 31.4  MCV   --   --  95.7  PLT 186 216  --    Cardiac Enzymes: No results for input(s): CKTOTAL, CKMB, CKMBINDEX, TROPONINI in the last 8760 hours. BNP: Invalid input(s): POCBNP Lab Results  Component Value Date   HGBA1C 6.9 12/17/2017   Lab Results  Component Value Date   TSH 3.48 09/17/2017   No results found for: VITAMINB12 No results found for: FOLATE No results found for: IRON, TIBC, FERRITIN  Imaging and Procedures obtained prior to SNF admission: Dg Chest 2 View  Result Date: 11/24/2014 CLINICAL DATA:  Cough, congestion, low-grade fever for 10 days. EXAM: CHEST  2 VIEW COMPARISON:  11/14/2013 FINDINGS: Interstitial prominence throughout the lungs has improved since prior study. Residual interstitial changes likely reflects underlying chronic lung disease. No confluent opacities or effusions. Heart is normal size. Diffuse calcifications throughout the aorta. Degenerative changes in the shoulders, left greater than right. IMPRESSION: Improving interstitial prominence with mild persistent interstitial prominence, likely chronic interstitial disease. Electronically Signed   By: Rolm Baptise  M.D.   On: 11/24/2014 10:46    Assessment/Plan 1. Chronic combined systolic and diastolic congestive heart failure (HCC) Asymptomatic at present.  Appears well compensated.  2. Type 2 diabetes with decreased circulation (HCC) Last A1c was at target.  I would really rather A1c is running a little bit higher on somebody who is 84 years old.  Her sugars are tolerated better than lower ones  3. Chronic obstructive pulmonary disease, unspecified COPD type ([place]) No recent exacerbations.  Symptoms are well managed with current regimen of chronic oxygen and  4. CKD (chronic kidney disease) stage 3, GFR 30-59 ml/min (HCC) Kidney disease has been stable over the past 6 months   Family/ staff Communication: Things communicated to patient and staff  Labs/tests ordered:  Lillette Boxer. [place]ra Heck, [place] [address] [place], [place] Office [phone]

## 2018-02-12 ENCOUNTER — Encounter: Payer: Self-pay | Admitting: Nurse Practitioner

## 2018-02-12 ENCOUNTER — Non-Acute Institutional Stay (SKILLED_NURSING_FACILITY): Payer: Medicare Other | Admitting: Nurse Practitioner

## 2018-02-12 DIAGNOSIS — N183 Chronic kidney disease, stage 3 (moderate): Secondary | ICD-10-CM

## 2018-02-12 DIAGNOSIS — E1151 Type 2 diabetes mellitus with diabetic peripheral angiopathy without gangrene: Secondary | ICD-10-CM

## 2018-02-12 DIAGNOSIS — S8012XA Contusion of left lower leg, initial encounter: Secondary | ICD-10-CM

## 2018-02-12 DIAGNOSIS — K219 Gastro-esophageal reflux disease without esophagitis: Secondary | ICD-10-CM

## 2018-02-12 DIAGNOSIS — Z794 Long term (current) use of insulin: Secondary | ICD-10-CM

## 2018-02-12 DIAGNOSIS — J449 Chronic obstructive pulmonary disease, unspecified: Secondary | ICD-10-CM

## 2018-02-12 DIAGNOSIS — I130 Hypertensive heart and chronic kidney disease with heart failure and stage 1 through stage 4 chronic kidney disease, or unspecified chronic kidney disease: Secondary | ICD-10-CM | POA: Diagnosis not present

## 2018-02-12 DIAGNOSIS — I5042 Chronic combined systolic (congestive) and diastolic (congestive) heart failure: Secondary | ICD-10-CM

## 2018-02-12 DIAGNOSIS — G4709 Other insomnia: Secondary | ICD-10-CM

## 2018-02-12 DIAGNOSIS — M1990 Unspecified osteoarthritis, unspecified site: Secondary | ICD-10-CM

## 2018-02-12 DIAGNOSIS — E1122 Type 2 diabetes mellitus with diabetic chronic kidney disease: Secondary | ICD-10-CM

## 2018-02-12 NOTE — Progress Notes (Signed)
Location:  [facility] Room Number: 16 Place of Service:  SNF (31) Provider:  Marlana Latus  NP  Virgie Dad, MD  Patient Care Team: Virgie Dad, MD as PCP - General (Internal Medicine) Clent Jacks, MD (Ophthalmology) Adrian Prows, MD as Attending Physician (Cardiology) [place] [name], MD as Consulting Physician (Orthopedic Surgery) Clance, Armando Reichert, MD as Consulting Physician (Pulmonary Disease) Chyanna Flock X, NP as Nurse Practitioner (Internal Medicine)  Extended Emergency Contact Information Primary Emergency Contact: Cedeno,[name] Address: [address]          [place], [place] [address] [place] Phone: [phone] Mobile Phone: [phone] Relation: Son Secondary Emergency Contact: [name] [place] Mobile Phone: [phone] Relation: Daughter  Code Status:   Goals of care: Advanced Directive information Advanced Directives 02/12/2018  Does Patient Have a Medical Advance Directive? Yes  Type of Paramedic of [place];Living will;Out of facility DNR (pink MOST or yellow form)  Does patient want to make changes to medical advance directive? No - Patient declined  Copy of [place] in Chart? Yes - validated most recent copy scanned in chart (See row information)  Pre-existing out of facility DNR order (yellow form or pink MOST form) Yellow form placed in chart (order not valid for inpatient use)     Chief Complaint  Patient presents with  . Medical Management of Chronic Issues    HPI:  Pt is a 85 y.o. female seen today for medical management of chronic diseases.     The patient has history of COPD, SOB, O2 dependent, 1.5lpm via [place], on Advair bid,  DuoNeb tid and q8h prn, Prednisone 5mg  qd, Mucinex 600mg  bid. Her mood is stable on Trazodone 50mg  qd. Peripheral edema, trace, chronic, on Spironolactone 25mg  qd, Furosemide 40mg  bid.  GERD,  stable, on Omeprazole 20mg  qd. HTN, blood pressure is controlled on Metoprolol 75mg  bid, Losartain 50mg  bid. T2DM, last Hgb a1c 6.9 12/17/17, on Lantus 26 u qd, Humalog 8u CBG 150-250, 12u >250am(12u 150-250, 15u >250 pm). OA pain is managed with Tylenol 1000mg  bid.  Past Medical History:  Diagnosis Date  . Abnormality of gait   . Anemia, unspecified   . Arthritis   . Bronchiectasis without acute exacerbation ([place]) 04/10/2011   CT chest 2011   . Cataract 1990   Dr. Katy Fitch  . Cholelithiases   . Closed fracture of unspecified part of ramus of mandible   . COPD (chronic obstructive pulmonary disease) ([place])   . Coronary atherosclerosis of unspecified type of vessel, native or graft   . Disturbance of skin sensation   . DOE (dyspnea on exertion) 04/10/2011   PFTs 2013:  FEV1 1.04 (78%), ratio 64, couldn't do maneuver for lung volumes or dlco   . Edema   . Esophageal reflux   . Fall from other slipping, tripping, or stumbling   . Gastric ulcer with hemorrhage 2008  . Hypertension   . Hypoxemia   . Insomnia, unspecified   . Mucopurulent chronic bronchitis ([place])   . Nodule of neck 11/07/2013   Left neck posteriorly   . Nontoxic uninodular goiter   . Osteoarthrosis, unspecified whether generalized or localized, unspecified site   . Other and unspecified hyperlipidemia   . Other malaise and fatigue   . Pain in joint, lower leg 2010   right knee  . Pain in joint, shoulder region 2013   left  . Palpitations   .  Pneumonia, organism unspecified(486)   . Regional enteritis of unspecified site   . Rosacea   . Sciatica   . Squamous cell carcinoma of skin of lower limb, including hip 07/27/2012   Nonhealing lesion of the left mid calf medially   . Tachycardia 12/06/2014  . Tension headache   . Tension headache   . Type II or unspecified type diabetes mellitus without mention of complication, not stated as uncontrolled   . Unspecified venous (peripheral) insufficiency   . Urine, incontinence,  stress female 07/19/2013   Past Surgical History:  Procedure Laterality Date  . EYE SURGERY Bilateral 1990   Dr. Katy Fitch  . GASTROJEJUNOSTOMY  05/2004   secondary to ulcer  . JOINT REPLACEMENT Bilateral 1993-1998   total knee replacement  . MOLE REMOVAL  05/11/14   left hand and left side of [facility]  . OTHER SURGICAL HISTORY  2008   Ulcer surgery   . SMALL INTESTINE SURGERY    . TOTAL KNEE ARTHROPLASTY Bilateral 1993, 1998   knees    Allergies  Allergen Reactions  . Adhesive [Tape]     unknown  . Aspirin     unknown  . Augmentin [Amoxicillin-Pot Clavulanate]   . Codeine     unknown  . Diclofenac   . Enalapril Cough  . Hydrocodone     unknown  . Pantoprazole Sodium     unknown  . Voltaren [Diclofenac Sodium]     Outpatient Encounter Medications as of 02/12/2018  Medication Sig  . acetaminophen (TYLENOL) 500 MG tablet Take 1,000 mg by mouth 2 (two) times daily.   . ertapenem 1 g in sodium chloride 0.9 % 100 mL Inject 1 g into the vein every morning. Reconstitute medication with 3Ml lidocaine for administration  . Fluticasone-Salmeterol (ADVAIR) 100-50 MCG/DOSE AEPB Inhale 1 puff into the lungs 2 (two) times daily.  . furosemide (LASIX) 40 MG tablet Take 40 mg by mouth 2 (two) times daily. Hold for SBP <110  . guaiFENesin (MUCINEX) 600 MG 12 hr tablet Take 600 mg by mouth 2 (two) times daily.  . insulin glargine (LANTUS) 100 UNIT/ML injection Inject 26 Units into the skin at bedtime.   . insulin lispro (HUMALOG) 100 UNIT/ML cartridge Inject 12-15 Units into the skin daily. Give 12 units if CBG between 150-250 give 15 units if CBG > 250  . insulin lispro (HUMALOG) 100 UNIT/ML injection Inject 8-12 Units into the skin 2 (two) times daily between meals. give 8 units if CBG between 150-250 give 12 units if CBG > 250  . ipratropium-albuterol (DUONEB) 0.5-2.5 (3) MG/3ML SOLN Take 3 mLs by nebulization every 8 (eight) hours as needed.   Marland Kitchen ipratropium-albuterol  (DUONEB) 0.5-2.5 (3) MG/3ML SOLN Take 3 mLs by nebulization 2 (two) times daily.  Marland Kitchen lidocaine (LIDODERM) 5 % Place 1 patch onto the skin daily. Apply to left neck for pain Remove & Discard patch within 12 hours or as directed by MD  . losartan (COZAAR) 50 MG tablet Take 50 mg by mouth 2 (two) times daily.  . Metoprolol Tartrate 75 MG TABS Take 75 mg by mouth 2 (two) times daily.  . mineral oil-hydrophilic petrolatum (AQUAPHOR) ointment Apply 1 application daily as needed topically for dry skin (to the left upper arm).  Marland Kitchen omeprazole (PRILOSEC) 20 MG capsule Take 20 mg by mouth daily.   . OXYGEN Inhale 1.5 L/min into the lungs.   . predniSONE (DELTASONE) 5 MG tablet Take 5 mg by mouth daily with  breakfast.  . saccharomyces boulardii (FLORASTOR) 250 MG capsule Take 250 mg by mouth 2 (two) times daily.  Marland Kitchen spironolactone (ALDACTONE) 25 MG tablet Take 25 mg by mouth daily.   . traZODone (DESYREL) 50 MG tablet Take 50 mg by mouth daily.   Marland Kitchen trolamine salicylate (ASPERCREME) 10 % cream Apply 1 application topically 3 (three) times daily. Apply to right knee  . zinc oxide 20 % ointment Apply 1 application topically as needed for irritation. Apply to buttocks as needed  . [DISCONTINUED] metoprolol tartrate (LOPRESSOR) 50 MG tablet Take 75 mg by mouth 2 (two) times daily. hold medication if SBP < 110 OR HR < 60 b/min   No facility-administered encounter medications on file as of 02/12/2018.     Review of Systems  Constitutional: Negative for activity change, appetite change, chills, diaphoresis, fatigue, fever and unexpected weight change.       Same weight comparing to 02/2017  HENT: Positive for hearing loss. Negative for congestion and voice change.   Respiratory: Positive for cough and wheezing.        O2 dependent.   Cardiovascular: Positive for leg swelling. Negative for chest pain and palpitations.  Gastrointestinal: Negative for abdominal distention, abdominal pain, constipation, diarrhea, nausea  and vomiting.       Right flank pain sometimes, not today  Genitourinary: Negative for difficulty urinating, dysuria and urgency.  Musculoskeletal: Positive for arthralgias and gait problem.  Skin: Negative for color change and pallor.  Neurological: Negative for dizziness, speech difficulty, weakness and headaches.       Memory lapses.   Psychiatric/Behavioral: Negative for agitation, behavioral problems, hallucinations and sleep disturbance. The patient is not nervous/anxious.     Immunization History  Administered Date(s) Administered  . Influenza Split 11/06/2011, 11/04/2012  . Influenza,inj,Quad PF,6+ Mos 11/17/2013  . Influenza-Unspecified 11/09/2014, 11/16/2015, 11/25/2015, 11/13/2016, 11/16/2017  . PPD Test 06/19/2010, 11/29/2014  . Pneumococcal Conjugate-13 08/11/2014  . Pneumococcal Polysaccharide-23 02/04/2003  . Pneumococcal-Unspecified 06/07/2014  . Tdap 02/27/2013  . Zoster 02/03/2006   Pertinent  Health Maintenance Due  Topic Date Due  . OPHTHALMOLOGY EXAM  12/16/2018 (Originally 07/22/2017)  . DEXA SCAN  07/18/2026 (Originally 10/31/1984)  . FOOT EXAM  06/06/2018  . HEMOGLOBIN A1C  06/17/2018  . INFLUENZA VACCINE  Completed  . PNA vac Low Risk Adult  Completed   Fall Risk  02/02/2017 12/06/2014 07/11/2014 05/22/2014 11/07/2013  Falls in the past year? - Yes No Yes No  Number falls in past yr: - 1 - 1 -  Injury with Fall? - No - No -  Risk for fall due to : History of fall(s);Impaired mobility History of fall(s);Impaired mobility - History of fall(s);Impaired balance/gait -  Follow up - - - Falls evaluation completed -   Functional Status Survey:    Vitals:   02/12/18 1312  BP: (!) 172/88  Pulse: 80  Resp: 20  Temp: (!) 97 F (36.1 C)  SpO2: 95%  Weight: 213 lb (96.6 kg)  Height: 5\' 7"  (1.702 m)   Body mass index is 33.36 kg/m. Physical Exam Constitutional:      General: She is not in acute distress.    Appearance: Normal appearance. She is obese. She  is not ill-appearing, toxic-appearing or diaphoretic.  HENT:     Head: Normocephalic and atraumatic.     Nose: Nose normal.     Mouth/Throat:     Mouth: Mucous membranes are moist.  Eyes:     Extraocular Movements: Extraocular movements intact.  Pupils: Pupils are equal, round, and reactive to light.  Neck:     Musculoskeletal: Normal range of motion and neck supple.  Cardiovascular:     Rate and Rhythm: Normal rate and regular rhythm.     Heart sounds: No murmur.  Pulmonary:     Breath sounds: Rales present. No wheezing or rhonchi.     Comments: Posterior lower lungs Abdominal:     General: There is no distension.     Palpations: Abdomen is soft.     Tenderness: There is no abdominal tenderness. There is no right CVA tenderness, left CVA tenderness, guarding or rebound.  Musculoskeletal:     Right lower leg: Edema present.     Left lower leg: Edema present.     Comments: Trace edema BLE. Ambulates with walker, w/c to go further.   Skin:    General: Skin is warm and dry.     Comments: Left chin hematoma.   Neurological:     General: No focal deficit present.     Mental Status: She is alert.     Cranial Nerves: No cranial nerve deficit.     Motor: No weakness.     Coordination: Coordination normal.     Gait: Gait abnormal.     Comments: Oriented to person and place.   Psychiatric:        Mood and Affect: Mood normal.        Behavior: Behavior normal.        Thought Content: Thought content normal.        Judgment: Judgment normal.     Labs reviewed: Recent Labs    04/24/17 06/18/17 09/17/17 10/01/17  NA 140 138  138 137 136  K 4.7 4.6  4.6 4.7 4.6  CL 100 97  --   --   CO2 36 34  --   --   BUN 47* 41*  41* 58* 62*  CREATININE 1.2* 1.1  1.09 1.28 1.33  CALCIUM 9.0 9.1  9.1 9.0 9.1   Recent Labs    04/24/17 09/17/17  AST 15 15  ALT 11 10  ALKPHOS 106 89  BILITOT  --  0.2  PROT 5.3 5.3  ALBUMIN 3.2 3.3   Recent Labs    04/24/17 06/18/17 09/17/17    WBC 7.9 8.5  8.5 9.1  HGB 10.0* 10.9*  10.9 10.4  HCT 30* 32*  32.2 31.4  MCV  --   --  95.7  PLT 186 216  --    Lab Results  Component Value Date   TSH 3.48 09/17/2017   Lab Results  Component Value Date   HGBA1C 6.9 12/17/2017   Lab Results  Component Value Date   CHOL 117 12/15/2016   HDL 52 12/15/2016   LDLCALC 40 12/15/2016   TRIG 186 (A) 12/15/2016    Significant Diagnostic Results in last 30 days:  No results found.  Assessment/Plan Hypertensive heart and kidney disease with chronic combined systolic and diastolic congestive heart failure and stage 3 chronic kidney disease (HCC) Blood pressure is controlled, continue Metoprolol 75mg  bid, Losartan 50mg  bid. Compensated chronic congestive heart failure, continue Furosemide 40mg  bid, Spironolactone 25mg  qd.   Type 2 diabetes with decreased circulation (HCC) Continue Lantus 26 u qd, Humalog 8u CBG 150-250, 12u >250am(12u 150-250, 15u >250 pm). Chronic venous changes BLE  Chronic combined systolic and diastolic congestive heart failure (HCC) Chronic edema BLE remains no change, continue Furosemide 40mg  bid, Spironolactone 50mg  qd.   COPD (chronic  obstructive pulmonary disease) (HCC) SOB and wheezing episodes are her baseline, continue DuoNeb tid and q8hr prn, Advair bid, Mucinenx 600mg  bid, Prednisone 5mg  qd, O2 1.5lmp via [place].   Esophageal reflux Stable, continue Omeprazole 20mg  qd.   Arthritis Livable, continue Tylenol 1000mg  bid, walker for ambulation, w/c to go further.   Insomnia Her mood is stable, sleeps well, continue Trazodone 50mg  qd.   Controlled type 2 diabetes mellitus with stage 3 chronic kidney disease, with long-term current use of insulin (HCC) Blood sugar is controlled, continue Lantus, Humalog.   Traumatic hematoma of left lower leg No s/s of infection, observe.      Family/ staff Communication: plan of care reviewed with the patient and charge nurse.   Labs/tests ordered:  None    Time spend 25 minutes.

## 2018-02-14 ENCOUNTER — Encounter: Payer: Self-pay | Admitting: Nurse Practitioner

## 2018-02-14 DIAGNOSIS — S8012XA Contusion of left lower leg, initial encounter: Secondary | ICD-10-CM | POA: Insufficient documentation

## 2018-02-14 NOTE — Assessment & Plan Note (Signed)
Blood pressure is controlled, continue Metoprolol 75mg  bid, Losartan 50mg  bid. Compensated chronic congestive heart failure, continue Furosemide 40mg  bid, Spironolactone 25mg  qd.

## 2018-02-14 NOTE — Assessment & Plan Note (Addendum)
Continue Lantus 26 u qd, Humalog 8u CBG 150-250, 12u >250am(12u 150-250, 15u >250 pm). Chronic venous changes BLE

## 2018-02-14 NOTE — Assessment & Plan Note (Signed)
Her mood is stable, sleeps well, continue Trazodone 50mg  qd.

## 2018-02-14 NOTE — Assessment & Plan Note (Signed)
Livable, continue Tylenol 1000mg  bid, walker for ambulation, w/c to go further.

## 2018-02-14 NOTE — Assessment & Plan Note (Signed)
Stable, continue Omeprazole 20mg qd.  

## 2018-02-14 NOTE — Assessment & Plan Note (Signed)
Blood sugar is controlled, continue Lantus, Humalog.

## 2018-02-14 NOTE — Assessment & Plan Note (Signed)
SOB and wheezing episodes are her baseline, continue DuoNeb tid and q8hr prn, Advair bid, Mucinenx 600mg  bid, Prednisone 5mg  qd, O2 1.5lmp via [place].

## 2018-02-14 NOTE — Assessment & Plan Note (Signed)
No s/s of infection, observe.

## 2018-02-14 NOTE — Assessment & Plan Note (Signed)
Chronic edema BLE remains no change, continue Furosemide 40mg  bid, Spironolactone 50mg  qd.

## 2018-02-16 ENCOUNTER — Encounter: Payer: Self-pay | Admitting: Nurse Practitioner

## 2018-02-16 ENCOUNTER — Non-Acute Institutional Stay (SKILLED_NURSING_FACILITY): Payer: Medicare Other | Admitting: Nurse Practitioner

## 2018-02-16 DIAGNOSIS — I130 Hypertensive heart and chronic kidney disease with heart failure and stage 1 through stage 4 chronic kidney disease, or unspecified chronic kidney disease: Secondary | ICD-10-CM

## 2018-02-16 DIAGNOSIS — N183 Chronic kidney disease, stage 3 unspecified: Secondary | ICD-10-CM

## 2018-02-16 DIAGNOSIS — R4182 Altered mental status, unspecified: Secondary | ICD-10-CM | POA: Diagnosis not present

## 2018-02-16 DIAGNOSIS — I5042 Chronic combined systolic (congestive) and diastolic (congestive) heart failure: Secondary | ICD-10-CM

## 2018-02-16 DIAGNOSIS — R531 Weakness: Secondary | ICD-10-CM | POA: Diagnosis not present

## 2018-02-16 DIAGNOSIS — W19XXXA Unspecified fall, initial encounter: Secondary | ICD-10-CM | POA: Insufficient documentation

## 2018-02-16 NOTE — Assessment & Plan Note (Signed)
Elevated Sbp today, denied headache, chest pain/pressure, or increased SOB. Will continue Spironolactone 25mg  qd, Furosemide 40mg  bid, Metoprolol 75mg  bid, Losartan 50mg  bid. VS q shift x 72hrs.

## 2018-02-16 NOTE — Progress Notes (Signed)
Location:  [place] Room Number: 16 Place of Service:  SNF (31) Provider:  Marlana Latus  NP  Virgie Dad, MD  Patient Care Team: Virgie Dad, MD as PCP - General (Internal Medicine) Clent Jacks, MD (Ophthalmology) Adrian Prows, MD as Attending Physician (Cardiology) [place] [name], MD as Consulting Physician (Orthopedic Surgery) Clance, Armando Reichert, MD as Consulting Physician (Pulmonary Disease) Jemila Camille X, NP as Nurse Practitioner (Internal Medicine)  Extended Emergency Contact Information Primary Emergency Contact: Kittle,[name] Address: [address]          [place], [place] [address] [place] Phone: [phone] Mobile Phone: [phone] Relation: Son Secondary Emergency Contact: [name] Mobile Phone: [phone] Relation: Daughter  Code Status:  DNR Goals of care: Advanced Directive information Advanced Directives 02/12/2018  Does Patient Have a Medical Advance Directive? Yes  Type of Paramedic of [place];Living will;Out of facility DNR (pink MOST or yellow form)  Does patient want to make changes to medical advance directive? No - Patient declined  Copy of [place] in Chart? Yes - validated most recent copy scanned in chart (See row information)  Pre-existing out of facility DNR order (yellow form or pink MOST form) Yellow form placed in chart (order not valid for inpatient use)     Chief Complaint  Patient presents with  . Acute Visit    generalized weakness    HPI:  Pt is a 85 y.o. female seen today for an acute visit for generalized weakness, lethargy x 2 days. UTI, completed Ertapenem today. No further s/s of UTI. Hx of HTN, elevated Sbp today, on Spironolactone 25mg  qd, Metoprolol 75mg  bid, Losartan 50mg  bid, Furosemide 40mg  bid. OA multiple sites, on Tylenol 1000mg  bid, Lidoderm patch in left neck to the shoulder  area.    Past Medical History:  Diagnosis Date  . Abnormality of gait   . Anemia, unspecified   . Arthritis   . Bronchiectasis without acute exacerbation ([place]) 04/10/2011   CT chest 2011   . Cataract 1990   Dr. Katy Fitch  . Cholelithiases   . Closed fracture of unspecified part of ramus of mandible   . COPD (chronic obstructive pulmonary disease) ([place])   . Coronary atherosclerosis of unspecified type of vessel, native or graft   . Disturbance of skin sensation   . DOE (dyspnea on exertion) 04/10/2011   PFTs 2013:  FEV1 1.04 (78%), ratio 64, couldn't do maneuver for lung volumes or dlco   . Edema   . Esophageal reflux   . Fall from other slipping, tripping, or stumbling   . Gastric ulcer with hemorrhage 2008  . Hypertension   . Hypoxemia   . Insomnia, unspecified   . Mucopurulent chronic bronchitis ([place])   . Nodule of neck 11/07/2013   Left neck posteriorly   . Nontoxic uninodular goiter   . Osteoarthrosis, unspecified whether generalized or localized, unspecified site   . Other and unspecified hyperlipidemia   . Other malaise and fatigue   . Pain in joint, lower leg 2010   right knee  . Pain in joint, shoulder region 2013   left  . Palpitations   . Pneumonia, organism unspecified(486)   . Regional enteritis of unspecified site   . Rosacea   . Sciatica   . Squamous cell carcinoma of skin of lower limb, including hip 07/27/2012   Nonhealing lesion of the left mid calf  medially   . Tachycardia 12/06/2014  . Tension headache   . Tension headache   . Type II or unspecified type diabetes mellitus without mention of complication, not stated as uncontrolled   . Unspecified venous (peripheral) insufficiency   . Urine, incontinence, stress female 07/19/2013   Past Surgical History:  Procedure Laterality Date  . EYE SURGERY Bilateral 1990   Dr. Katy Fitch  . GASTROJEJUNOSTOMY  05/2004   secondary to ulcer  . JOINT REPLACEMENT Bilateral 1993-1998   total knee replacement  . MOLE REMOVAL   05/11/14   left hand and left side of face [facility]  . OTHER SURGICAL HISTORY  2008   Ulcer surgery   . SMALL INTESTINE SURGERY    . TOTAL KNEE ARTHROPLASTY Bilateral 1993, 1998   knees    Allergies  Allergen Reactions  . Adhesive [Tape]     unknown  . Aspirin     unknown  . Augmentin [Amoxicillin-Pot Clavulanate]   . Codeine     unknown  . Diclofenac   . Enalapril Cough  . Hydrocodone     unknown  . Pantoprazole Sodium     unknown  . Voltaren [Diclofenac Sodium]     Outpatient Encounter Medications as of 02/16/2018  Medication Sig  . acetaminophen (TYLENOL) 500 MG tablet Take 1,000 mg by mouth 2 (two) times daily.   . ertapenem 1 g in sodium chloride 0.9 % 100 mL Inject 1 g into the vein every morning. Reconstitute medication with 3Ml lidocaine for administration  . Fluticasone-Salmeterol (ADVAIR) 100-50 MCG/DOSE AEPB Inhale 1 puff into the lungs 2 (two) times daily.  . furosemide (LASIX) 40 MG tablet Take 40 mg by mouth 2 (two) times daily. Hold for SBP <110  . guaiFENesin (MUCINEX) 600 MG 12 hr tablet Take 600 mg by mouth 2 (two) times daily.  . insulin glargine (LANTUS) 100 UNIT/ML injection Inject 26 Units into the skin at bedtime.   . insulin lispro (HUMALOG) 100 UNIT/ML cartridge Inject 12-15 Units into the skin daily. Give 12 units if CBG between 150-250 give 15 units if CBG > 250  . insulin lispro (HUMALOG) 100 UNIT/ML injection Inject 8-12 Units into the skin 2 (two) times daily between meals. give 8 units if CBG between 150-250 give 12 units if CBG > 250  . ipratropium-albuterol (DUONEB) 0.5-2.5 (3) MG/3ML SOLN Take 3 mLs by nebulization every 8 (eight) hours as needed.   Marland Kitchen ipratropium-albuterol (DUONEB) 0.5-2.5 (3) MG/3ML SOLN Take 3 mLs by nebulization 2 (two) times daily.  Marland Kitchen lidocaine (LIDODERM) 5 % Place 1 patch onto the skin daily. Apply to left neck for pain Remove & Discard patch within 12 hours or as directed by MD  . losartan (COZAAR) 50 MG tablet  Take 50 mg by mouth 2 (two) times daily.  . Metoprolol Tartrate 75 MG TABS Take 75 mg by mouth 2 (two) times daily.  . mineral oil-hydrophilic petrolatum (AQUAPHOR) ointment Apply 1 application daily as needed topically for dry skin (to the left upper arm).  Marland Kitchen omeprazole (PRILOSEC) 20 MG capsule Take 20 mg by mouth daily.   . OXYGEN Inhale 1.5 L/min into the lungs.   . predniSONE (DELTASONE) 5 MG tablet Take 5 mg by mouth daily with breakfast.  . saccharomyces boulardii (FLORASTOR) 250 MG capsule Take 250 mg by mouth 2 (two) times daily.  Marland Kitchen spironolactone (ALDACTONE) 25 MG tablet Take 25 mg by mouth daily.   . traZODone (DESYREL) 50 MG tablet Take 50 mg  by mouth daily.   Marland Kitchen trolamine salicylate (ASPERCREME) 10 % cream Apply 1 application topically 3 (three) times daily. Apply to right knee  . zinc oxide 20 % ointment Apply 1 application topically as needed for irritation. Apply to buttocks as needed   No facility-administered encounter medications on file as of 02/16/2018.    ROS was provided with assistance of staff Review of Systems  Constitutional: Positive for activity change, appetite change and fatigue. Negative for chills, diaphoresis and fever.  HENT: Positive for hearing loss. Negative for congestion, sinus pressure, sinus pain and sore throat.   Respiratory: Positive for cough and shortness of breath. Negative for apnea, choking, chest tightness and wheezing.        DOE  Cardiovascular: Negative for chest pain, palpitations and leg swelling.  Gastrointestinal: Negative for abdominal distention, abdominal pain, constipation, diarrhea, nausea and vomiting.  Genitourinary: Negative for difficulty urinating, dysuria and urgency.  Musculoskeletal: Positive for arthralgias and gait problem.  Skin: Negative for color change and pallor.  Neurological: Negative for dizziness, facial asymmetry, speech difficulty, weakness and headaches.       Memory lapses.   Psychiatric/Behavioral: Negative  for agitation, behavioral problems, hallucinations and sleep disturbance. The patient is not nervous/anxious.        Lethargic.     Immunization History  Administered Date(s) Administered  . Influenza Split 11/06/2011, 11/04/2012  . Influenza,inj,Quad PF,6+ Mos 11/17/2013  . Influenza-Unspecified 11/09/2014, 11/16/2015, 11/25/2015, 11/13/2016, 11/16/2017  . PPD Test 06/19/2010, 11/29/2014  . Pneumococcal Conjugate-13 08/11/2014  . Pneumococcal Polysaccharide-23 02/04/2003  . Pneumococcal-Unspecified 06/07/2014  . Tdap 02/27/2013  . Zoster 02/03/2006   Pertinent  Health Maintenance Due  Topic Date Due  . OPHTHALMOLOGY EXAM  12/16/2018 (Originally 07/22/2017)  . DEXA SCAN  07/18/2026 (Originally 10/31/1984)  . FOOT EXAM  06/06/2018  . HEMOGLOBIN A1C  06/17/2018  . INFLUENZA VACCINE  Completed  . PNA vac Low Risk Adult  Completed   Fall Risk  02/02/2017 12/06/2014 07/11/2014 05/22/2014 11/07/2013  Falls in the past year? - Yes No Yes No  Number falls in past yr: - 1 - 1 -  Injury with Fall? - No - No -  Risk for fall due to : History of fall(s);Impaired mobility History of fall(s);Impaired mobility - History of fall(s);Impaired balance/gait -  Follow up - - - Falls evaluation completed -   Functional Status Survey:    Vitals:   02/16/18 1202  BP: (!) 172/84  Pulse: 88  Resp: 20  Temp: 99.1 F (37.3 C)  SpO2: 94%  Weight: 213 lb (96.6 kg)  Height: 5\' 7"  (1.702 m)   Body mass index is 33.36 kg/m. Physical Exam Constitutional:      General: She is not in acute distress.    Appearance: Normal appearance. She is ill-appearing. She is not toxic-appearing or diaphoretic.  HENT:     Head: Normocephalic and atraumatic.     Nose: Nose normal.     Mouth/Throat:     Mouth: Mucous membranes are moist.  Eyes:     Extraocular Movements: Extraocular movements intact.     Pupils: Pupils are equal, round, and reactive to light.  Cardiovascular:     Rate and Rhythm: Normal rate and  regular rhythm.     Heart sounds: No murmur.  Pulmonary:     Breath sounds: Rales present. No wheezing or rhonchi.     Comments: Bibasilar rales.  Abdominal:     General: There is no distension.  Palpations: Abdomen is soft.     Tenderness: There is no abdominal tenderness. There is no guarding or rebound.  Musculoskeletal:     Comments: The patient was examined in bed.   Skin:    General: Skin is warm and dry.  Neurological:     General: No focal deficit present.     Mental Status: She is alert.     Cranial Nerves: No cranial nerve deficit.     Motor: No weakness.     Coordination: Coordination normal.     Gait: Gait abnormal.     Comments: Oriented to person and place.   Psychiatric:        Mood and Affect: Mood normal.     Labs reviewed: Recent Labs    04/24/17 06/18/17 09/17/17 10/01/17  NA 140 138  138 137 136  K 4.7 4.6  4.6 4.7 4.6  CL 100 97  --   --   CO2 36 34  --   --   BUN 47* 41*  41* 58* 62*  CREATININE 1.2* 1.1  1.09 1.28 1.33  CALCIUM 9.0 9.1  9.1 9.0 9.1   Recent Labs    04/24/17 09/17/17  AST 15 15  ALT 11 10  ALKPHOS 106 89  BILITOT  --  0.2  PROT 5.3 5.3  ALBUMIN 3.2 3.3   Recent Labs    04/24/17 06/18/17 09/17/17  WBC 7.9 8.5  8.5 9.1  HGB 10.0* 10.9*  10.9 10.4  HCT 30* 32*  32.2 31.4  MCV  --   --  95.7  PLT 186 216  --    Lab Results  Component Value Date   TSH 3.48 09/17/2017   Lab Results  Component Value Date   HGBA1C 6.9 12/17/2017   Lab Results  Component Value Date   CHOL 117 12/15/2016   HDL 52 12/15/2016   LDLCALC 40 12/15/2016   TRIG 186 (A) 12/15/2016    Significant Diagnostic Results in last 30 days:  No results found.  Assessment/Plan Generalized weakness No new focal weakness, will obtain CBC/diff, CMP stat, monitor VS Sat O2 q shift x 72hrs. Observe.   Hypertensive heart and kidney disease with chronic combined systolic and diastolic congestive heart failure and stage 3 chronic kidney disease  (HCC) Elevated Sbp today, denied headache, chest pain/pressure, or increased SOB. Will continue Spironolactone 25mg  qd, Furosemide 40mg  bid, Metoprolol 75mg  bid, Losartan 50mg  bid. VS q shift x 72hrs.   Altered mental state Responded and conversing, but falls asleep spontaneously, CBC/diff, CMP stat, VS Sat O2 qshift x72hrs.     Family/ staff Communication: plan of care reviewed with the patient and charge nurse.   Labs/tests ordered:  CBC/diff, CMP  Time spend 25 minutes.

## 2018-02-16 NOTE — Assessment & Plan Note (Signed)
No new focal weakness, will obtain CBC/diff, CMP stat, monitor VS Sat O2 q shift x 72hrs. Observe.

## 2018-02-16 NOTE — Assessment & Plan Note (Signed)
Responded and conversing, but falls asleep spontaneously, CBC/diff, CMP stat, VS Sat O2 qshift x72hrs.

## 2018-02-17 ENCOUNTER — Encounter: Payer: Self-pay | Admitting: Internal Medicine

## 2018-02-17 ENCOUNTER — Encounter (HOSPITAL_COMMUNITY): Payer: Self-pay | Admitting: Family Medicine

## 2018-02-17 ENCOUNTER — Inpatient Hospital Stay (HOSPITAL_COMMUNITY)
Admission: EM | Admit: 2018-02-17 | Discharge: 2018-02-21 | DRG: 683 | Disposition: A | Discharge status: Skilled Nursing Facility | Payer: Medicare Other | Attending: Internal Medicine | Admitting: Internal Medicine

## 2018-02-17 ENCOUNTER — Non-Acute Institutional Stay (SKILLED_NURSING_FACILITY): Payer: Medicare Other | Admitting: Internal Medicine

## 2018-02-17 ENCOUNTER — Emergency Department (HOSPITAL_COMMUNITY): Payer: Medicare Other

## 2018-02-17 DIAGNOSIS — A0472 Enterocolitis due to Clostridium difficile, not specified as recurrent: Secondary | ICD-10-CM | POA: Diagnosis present

## 2018-02-17 DIAGNOSIS — N390 Urinary tract infection, site not specified: Secondary | ICD-10-CM

## 2018-02-17 DIAGNOSIS — J9611 Chronic respiratory failure with hypoxia: Secondary | ICD-10-CM | POA: Diagnosis present

## 2018-02-17 DIAGNOSIS — N179 Acute kidney failure, unspecified: Principal | ICD-10-CM

## 2018-02-17 DIAGNOSIS — F0390 Unspecified dementia without behavioral disturbance: Secondary | ICD-10-CM | POA: Diagnosis present

## 2018-02-17 DIAGNOSIS — I130 Hypertensive heart and chronic kidney disease with heart failure and stage 1 through stage 4 chronic kidney disease, or unspecified chronic kidney disease: Secondary | ICD-10-CM | POA: Diagnosis present

## 2018-02-17 DIAGNOSIS — N183 Chronic kidney disease, stage 3 unspecified: Secondary | ICD-10-CM | POA: Diagnosis present

## 2018-02-17 DIAGNOSIS — E1122 Type 2 diabetes mellitus with diabetic chronic kidney disease: Secondary | ICD-10-CM | POA: Diagnosis present

## 2018-02-17 DIAGNOSIS — R296 Repeated falls: Secondary | ICD-10-CM | POA: Diagnosis present

## 2018-02-17 DIAGNOSIS — Z66 Do not resuscitate: Secondary | ICD-10-CM | POA: Diagnosis present

## 2018-02-17 DIAGNOSIS — Z8711 Personal history of peptic ulcer disease: Secondary | ICD-10-CM

## 2018-02-17 DIAGNOSIS — I2510 Atherosclerotic heart disease of native coronary artery without angina pectoris: Secondary | ICD-10-CM | POA: Diagnosis present

## 2018-02-17 DIAGNOSIS — W010XXA Fall on same level from slipping, tripping and stumbling without subsequent striking against object, initial encounter: Secondary | ICD-10-CM | POA: Diagnosis present

## 2018-02-17 DIAGNOSIS — K8020 Calculus of gallbladder without cholecystitis without obstruction: Secondary | ICD-10-CM | POA: Diagnosis present

## 2018-02-17 DIAGNOSIS — K838 Other specified diseases of biliary tract: Secondary | ICD-10-CM | POA: Diagnosis present

## 2018-02-17 DIAGNOSIS — R509 Fever, unspecified: Secondary | ICD-10-CM

## 2018-02-17 DIAGNOSIS — I5042 Chronic combined systolic (congestive) and diastolic (congestive) heart failure: Secondary | ICD-10-CM

## 2018-02-17 DIAGNOSIS — Z833 Family history of diabetes mellitus: Secondary | ICD-10-CM

## 2018-02-17 DIAGNOSIS — E11649 Type 2 diabetes mellitus with hypoglycemia without coma: Secondary | ICD-10-CM | POA: Diagnosis present

## 2018-02-17 DIAGNOSIS — Z8249 Family history of ischemic heart disease and other diseases of the circulatory system: Secondary | ICD-10-CM

## 2018-02-17 DIAGNOSIS — D72829 Elevated white blood cell count, unspecified: Secondary | ICD-10-CM

## 2018-02-17 DIAGNOSIS — J479 Bronchiectasis, uncomplicated: Secondary | ICD-10-CM | POA: Diagnosis not present

## 2018-02-17 DIAGNOSIS — E870 Hyperosmolality and hypernatremia: Secondary | ICD-10-CM | POA: Diagnosis present

## 2018-02-17 DIAGNOSIS — R269 Unspecified abnormalities of gait and mobility: Secondary | ICD-10-CM | POA: Diagnosis present

## 2018-02-17 DIAGNOSIS — B9620 Unspecified Escherichia coli [E. coli] as the cause of diseases classified elsewhere: Secondary | ICD-10-CM | POA: Diagnosis present

## 2018-02-17 DIAGNOSIS — E860 Dehydration: Secondary | ICD-10-CM | POA: Diagnosis present

## 2018-02-17 DIAGNOSIS — E785 Hyperlipidemia, unspecified: Secondary | ICD-10-CM | POA: Diagnosis present

## 2018-02-17 DIAGNOSIS — Z1612 Extended spectrum beta lactamase (ESBL) resistance: Secondary | ICD-10-CM | POA: Diagnosis present

## 2018-02-17 DIAGNOSIS — R0602 Shortness of breath: Secondary | ICD-10-CM

## 2018-02-17 DIAGNOSIS — Z7952 Long term (current) use of systemic steroids: Secondary | ICD-10-CM

## 2018-02-17 DIAGNOSIS — D638 Anemia in other chronic diseases classified elsewhere: Secondary | ICD-10-CM | POA: Diagnosis present

## 2018-02-17 DIAGNOSIS — M5430 Sciatica, unspecified side: Secondary | ICD-10-CM | POA: Diagnosis present

## 2018-02-17 DIAGNOSIS — R5383 Other fatigue: Secondary | ICD-10-CM

## 2018-02-17 DIAGNOSIS — Z886 Allergy status to analgesic agent status: Secondary | ICD-10-CM

## 2018-02-17 DIAGNOSIS — I872 Venous insufficiency (chronic) (peripheral): Secondary | ICD-10-CM

## 2018-02-17 DIAGNOSIS — Z810 Family history of intellectual disabilities: Secondary | ICD-10-CM

## 2018-02-17 DIAGNOSIS — E875 Hyperkalemia: Secondary | ICD-10-CM | POA: Diagnosis present

## 2018-02-17 DIAGNOSIS — Z794 Long term (current) use of insulin: Secondary | ICD-10-CM

## 2018-02-17 DIAGNOSIS — G8929 Other chronic pain: Secondary | ICD-10-CM | POA: Diagnosis present

## 2018-02-17 DIAGNOSIS — J449 Chronic obstructive pulmonary disease, unspecified: Secondary | ICD-10-CM | POA: Diagnosis present

## 2018-02-17 DIAGNOSIS — Z9981 Dependence on supplemental oxygen: Secondary | ICD-10-CM

## 2018-02-17 DIAGNOSIS — Z87891 Personal history of nicotine dependence: Secondary | ICD-10-CM

## 2018-02-17 DIAGNOSIS — B9629 Other Escherichia coli [E. coli] as the cause of diseases classified elsewhere: Secondary | ICD-10-CM | POA: Diagnosis present

## 2018-02-17 DIAGNOSIS — Z85828 Personal history of other malignant neoplasm of skin: Secondary | ICD-10-CM

## 2018-02-17 DIAGNOSIS — R531 Weakness: Secondary | ICD-10-CM | POA: Diagnosis not present

## 2018-02-17 DIAGNOSIS — E1151 Type 2 diabetes mellitus with diabetic peripheral angiopathy without gangrene: Secondary | ICD-10-CM | POA: Diagnosis not present

## 2018-02-17 DIAGNOSIS — Z888 Allergy status to other drugs, medicaments and biological substances status: Secondary | ICD-10-CM

## 2018-02-17 DIAGNOSIS — Z91048 Other nonmedicinal substance allergy status: Secondary | ICD-10-CM

## 2018-02-17 DIAGNOSIS — Z96653 Presence of artificial knee joint, bilateral: Secondary | ICD-10-CM | POA: Diagnosis present

## 2018-02-17 DIAGNOSIS — Z800 Family history of malignant neoplasm of digestive organs: Secondary | ICD-10-CM

## 2018-02-17 DIAGNOSIS — Z881 Allergy status to other antibiotic agents status: Secondary | ICD-10-CM

## 2018-02-17 DIAGNOSIS — Z9884 Bariatric surgery status: Secondary | ICD-10-CM

## 2018-02-17 LAB — CBC WITH DIFFERENTIAL/PLATELET
Abs Immature Granulocytes: 0.19 10*3/uL — ABNORMAL HIGH (ref 0.00–0.07)
BASOS ABS: 0 10*3/uL (ref 0.0–0.1)
Basophils Relative: 0 %
Eosinophils Absolute: 0.1 10*3/uL (ref 0.0–0.5)
Eosinophils Relative: 1 %
HCT: 34.3 % — ABNORMAL LOW (ref 36.0–46.0)
HEMOGLOBIN: 10.6 g/dL — AB (ref 12.0–15.0)
Immature Granulocytes: 1 %
LYMPHS PCT: 8 %
Lymphs Abs: 1.2 10*3/uL (ref 0.7–4.0)
MCH: 32.1 pg (ref 26.0–34.0)
MCHC: 30.9 g/dL (ref 30.0–36.0)
MCV: 103.9 fL — ABNORMAL HIGH (ref 80.0–100.0)
Monocytes Absolute: 0.9 10*3/uL (ref 0.1–1.0)
Monocytes Relative: 6 %
Neutro Abs: 12.3 10*3/uL — ABNORMAL HIGH (ref 1.7–7.7)
Neutrophils Relative %: 84 %
Platelets: 241 10*3/uL (ref 150–400)
RBC: 3.3 MIL/uL — ABNORMAL LOW (ref 3.87–5.11)
RDW: 12.5 % (ref 11.5–15.5)
WBC: 14.7 10*3/uL — ABNORMAL HIGH (ref 4.0–10.5)
nRBC: 0 % (ref 0.0–0.2)

## 2018-02-17 LAB — URINALYSIS, ROUTINE W REFLEX MICROSCOPIC
BILIRUBIN URINE: NEGATIVE
Glucose, UA: NEGATIVE mg/dL
HGB URINE DIPSTICK: NEGATIVE
Ketones, ur: NEGATIVE mg/dL
Nitrite: NEGATIVE
Protein, ur: NEGATIVE mg/dL
Specific Gravity, Urine: 1.017 (ref 1.005–1.030)
pH: 5 (ref 5.0–8.0)

## 2018-02-17 LAB — COMPREHENSIVE METABOLIC PANEL
ALT: 14 U/L (ref 0–44)
ANION GAP: 12 (ref 5–15)
AST: 22 U/L (ref 15–41)
Albumin: 3.5 g/dL (ref 3.5–5.0)
Alkaline Phosphatase: 124 U/L (ref 38–126)
BUN: 84 mg/dL — ABNORMAL HIGH (ref 8–23)
CO2: 26 mmol/L (ref 22–32)
Calcium: 9.2 mg/dL (ref 8.9–10.3)
Chloride: 101 mmol/L (ref 98–111)
Creatinine, Ser: 1.7 mg/dL — ABNORMAL HIGH (ref 0.44–1.00)
GFR calc Af Amer: 29 mL/min — ABNORMAL LOW (ref >60)
GFR calc non Af Amer: 25 mL/min — ABNORMAL LOW (ref >60)
Glucose, Bld: 179 mg/dL — ABNORMAL HIGH (ref 70–99)
Potassium: 5.7 mmol/L — ABNORMAL HIGH (ref 3.5–5.1)
Sodium: 139 mmol/L (ref 135–145)
Total Bilirubin: 0.7 mg/dL (ref 0.3–1.2)
Total Protein: 6.7 g/dL (ref 6.5–8.1)

## 2018-02-17 LAB — I-STAT CG4 LACTIC ACID, ED: LACTIC ACID, VENOUS: 1.63 mmol/L (ref 0.5–1.9)

## 2018-02-17 MED ORDER — INSULIN ASPART 100 UNIT/ML ~~LOC~~ SOLN: 0.0000 [IU] | Freq: Every day | SUBCUTANEOUS | Status: DC

## 2018-02-17 MED ORDER — TRAZODONE HCL 50 MG PO TABS
50.0000 mg | ORAL_TABLET | Freq: Every day | ORAL | Status: DC
  Administered 2018-02-18 – 2018-02-21 (×4): 50 mg via ORAL
  Filled 2018-02-17 (×4): qty 1

## 2018-02-17 MED ORDER — SODIUM CHLORIDE 0.9 % IV SOLN
INTRAVENOUS | Status: AC
  Administered 2018-02-18: 01:00:00 via INTRAVENOUS

## 2018-02-17 MED ORDER — SODIUM POLYSTYRENE SULFONATE 15 GM/60ML PO SUSP
60.0000 g | Freq: Once | ORAL | Status: AC
  Administered 2018-02-18: 60 g via ORAL
  Filled 2018-02-17: qty 240

## 2018-02-17 MED ORDER — INSULIN GLARGINE 100 UNIT/ML ~~LOC~~ SOLN
26.0000 [IU] | Freq: Every day | SUBCUTANEOUS | Status: DC
  Administered 2018-02-18 – 2018-02-19 (×3): 26 [IU] via SUBCUTANEOUS
  Filled 2018-02-17 (×5): qty 0.26

## 2018-02-17 MED ORDER — ONDANSETRON HCL 4 MG PO TABS: 4.0000 mg | ORAL_TABLET | Freq: Four times a day (QID) | ORAL | Status: DC | PRN

## 2018-02-17 MED ORDER — METRONIDAZOLE IN NACL 5-0.79 MG/ML-% IV SOLN
500.0000 mg | Freq: Three times a day (TID) | INTRAVENOUS | Status: DC
  Administered 2018-02-18 (×2): 500 mg via INTRAVENOUS
  Filled 2018-02-17 (×2): qty 100

## 2018-02-17 MED ORDER — ONDANSETRON HCL 4 MG/2ML IJ SOLN: 4.0000 mg | Freq: Four times a day (QID) | INTRAMUSCULAR | Status: DC | PRN

## 2018-02-17 MED ORDER — SENNOSIDES-DOCUSATE SODIUM 8.6-50 MG PO TABS: 1.0000 | ORAL_TABLET | Freq: Every evening | ORAL | Status: DC | PRN

## 2018-02-17 MED ORDER — ACETAMINOPHEN 650 MG RE SUPP: 650.0000 mg | Freq: Four times a day (QID) | RECTAL | Status: DC | PRN

## 2018-02-17 MED ORDER — INSULIN ASPART 100 UNIT/ML ~~LOC~~ SOLN
0.0000 [IU] | Freq: Three times a day (TID) | SUBCUTANEOUS | Status: DC
  Administered 2018-02-18 (×2): 2 [IU] via SUBCUTANEOUS
  Administered 2018-02-18 – 2018-02-19 (×3): 1 [IU] via SUBCUTANEOUS
  Filled 2018-02-17 (×2): qty 1

## 2018-02-17 MED ORDER — PREDNISONE 5 MG PO TABS
5.0000 mg | ORAL_TABLET | Freq: Every day | ORAL | Status: DC
  Administered 2018-02-18 – 2018-02-21 (×4): 5 mg via ORAL
  Filled 2018-02-17 (×5): qty 1

## 2018-02-17 MED ORDER — SACCHAROMYCES BOULARDII 250 MG PO CAPS
250.0000 mg | ORAL_CAPSULE | Freq: Two times a day (BID) | ORAL | Status: DC
  Administered 2018-02-18 – 2018-02-21 (×7): 250 mg via ORAL
  Filled 2018-02-17 (×7): qty 1

## 2018-02-17 MED ORDER — METOPROLOL TARTRATE 25 MG PO TABS
75.0000 mg | ORAL_TABLET | Freq: Two times a day (BID) | ORAL | Status: DC
  Administered 2018-02-18 – 2018-02-21 (×7): 75 mg via ORAL
  Filled 2018-02-17 (×7): qty 3

## 2018-02-17 MED ORDER — SODIUM CHLORIDE 0.9 % IV BOLUS
500.0000 mL | Freq: Once | INTRAVENOUS | Status: AC
  Administered 2018-02-17: 500 mL via INTRAVENOUS

## 2018-02-17 MED ORDER — MOMETASONE FURO-FORMOTEROL FUM 100-5 MCG/ACT IN AERO
2.0000 | INHALATION_SPRAY | Freq: Two times a day (BID) | RESPIRATORY_TRACT | Status: DC
  Administered 2018-02-19 – 2018-02-20 (×3): 2 via RESPIRATORY_TRACT
  Filled 2018-02-17 (×4): qty 8.8

## 2018-02-17 MED ORDER — ACETAMINOPHEN 325 MG PO TABS
650.0000 mg | ORAL_TABLET | Freq: Four times a day (QID) | ORAL | Status: DC | PRN
  Administered 2018-02-19 – 2018-02-21 (×3): 650 mg via ORAL
  Filled 2018-02-17 (×3): qty 2

## 2018-02-17 MED ORDER — LIDOCAINE 5 % EX PTCH
1.0000 | MEDICATED_PATCH | CUTANEOUS | Status: DC
  Administered 2018-02-18 – 2018-02-19 (×3): 1 via TRANSDERMAL
  Filled 2018-02-17 (×4): qty 1

## 2018-02-17 MED ORDER — SODIUM CHLORIDE 0.9 % IV SOLN
2.0000 g | Freq: Two times a day (BID) | INTRAVENOUS | Status: DC
  Administered 2018-02-18 (×2): 2 g via INTRAVENOUS
  Filled 2018-02-17 (×2): qty 2

## 2018-02-17 NOTE — ED Notes (Signed)
Bed: WA01 Expected date:  Expected time:  Means of arrival:  Comments: EMS/shob/sepsis

## 2018-02-17 NOTE — H&P (Signed)
History and Physical    MATHILDA MAGUIRE KYH:[id number] DOB: [birth year] DOA: 02/17/2018  PCP: Virgie Dad, MD   Patient coming from: [facility]    Chief Complaint: fever, lethargy, abnormal labs  HPI: BROOKLYNNE PEREIDA is a 85 y.o. female with medical history significant of COPD on 2LNC, T2DM on insulin, chronic grade 2 DD by echo on 1013/2015, admitted from SNF with temperature of 100.4 and lethargy and found to have AKI and elevated K. Pt was recently being treated with ESBL with UTI with ertapenem and was noted to have more frequent falls, become increasingly weak and lose ADLs and IADLs. She was also noted to be complaining of pain in her back and legs. Pt reports that she has been more tired and has had decreased PO intake, abdominal distention but no N/V or diarrhea. She denies any cough, CP, orthopnea, worsening LE edema. Pt was seen by PCP in SNF and was sent to the ED when labs were abnormal.   ED Course: In the ED Pt's vitals were unremarkable. Labs were notable for elevated WBC, K of 5.7, elvated BUN and creatinine of 1.7 (baseline around 1-1.3). LFTs were normal. CT Abd/pelvis showed CBD dilation and possible stones in distal CBD as well as 65mm renal artery aneurysm. Pt was given IVF and   Review of Systems: As per HPI otherwise 10 point review of systems negative.    Past Medical History:  Diagnosis Date  . Abnormality of gait   . Anemia, unspecified   . Arthritis   . Bronchiectasis without acute exacerbation ([place]) 04/10/2011   CT chest 2011   . Cataract 1990   Dr. Katy Fitch  . Cholelithiases   . Closed fracture of unspecified part of ramus of mandible   . COPD (chronic obstructive pulmonary disease) ([place])   . Coronary atherosclerosis of unspecified type of vessel, native or graft   . Disturbance of skin sensation   . DOE (dyspnea on exertion) 04/10/2011   PFTs 2013:  FEV1 1.04 (78%), ratio 64, couldn't do maneuver for lung volumes or dlco   . Edema   . Esophageal  reflux   . Fall from other slipping, tripping, or stumbling   . Gastric ulcer with hemorrhage 2008  . Hypertension   . Hypoxemia   . Insomnia, unspecified   . Mucopurulent chronic bronchitis ([place])   . Nodule of neck 11/07/2013   Left neck posteriorly   . Nontoxic uninodular goiter   . Osteoarthrosis, unspecified whether generalized or localized, unspecified site   . Other and unspecified hyperlipidemia   . Other malaise and fatigue   . Pain in joint, lower leg 2010   right knee  . Pain in joint, shoulder region 2013   left  . Palpitations   . Pneumonia, organism unspecified(486)   . Regional enteritis of unspecified site   . Rosacea   . Sciatica   . Squamous cell carcinoma of skin of lower limb, including hip 07/27/2012   Nonhealing lesion of the left mid calf medially   . Tachycardia 12/06/2014  . Tension headache   . Tension headache   . Type II or unspecified type diabetes mellitus without mention of complication, not stated as uncontrolled   . Unspecified venous (peripheral) insufficiency   . Urine, incontinence, stress female 07/19/2013    Past Surgical History:  Procedure Laterality Date  . EYE SURGERY Bilateral 1990   Dr. Katy Fitch  . GASTROJEJUNOSTOMY  05/2004   secondary to ulcer  . JOINT  REPLACEMENT Bilateral 1993-1998   total knee replacement  . MOLE REMOVAL  05/11/14   left hand and left side of [facility]  . OTHER SURGICAL HISTORY  2008   Ulcer surgery   . SMALL INTESTINE SURGERY    . TOTAL KNEE ARTHROPLASTY Bilateral 1993, 1998   knees     reports that she quit smoking about 45 years ago. Her smoking use included cigarettes. She has a 3.00 pack-year smoking history. She has never used smokeless tobacco. She reports that she does not drink alcohol or use drugs.  Allergies  Allergen Reactions  . Adhesive [Tape]     unknown  . Aspirin     unknown  . Augmentin [Amoxicillin-Pot Clavulanate]   . Codeine     unknown  . Diclofenac   . Enalapril  Cough  . Hydrocodone     unknown  . Pantoprazole Sodium     unknown  . Voltaren [Diclofenac Sodium]     Family History  Problem Relation Age of Onset  . Heart disease Father   . Colon cancer Sister   . Diabetes Brother   . Obesity Brother   . Mental retardation Daughter   . Obesity Daughter      Prior to Admission medications   Medication Sig Start Date End Date Taking? Authorizing Provider  acetaminophen (TYLENOL) 500 MG tablet Take 1,000 mg by mouth 2 (two) times daily.    Yes [provider]  Fluticasone-Salmeterol (ADVAIR) 100-50 MCG/DOSE AEPB Inhale 1 puff into the lungs 2 (two) times daily.   Yes [provider]  furosemide (LASIX) 40 MG tablet Take 40 mg by mouth 2 (two) times daily. Hold for SBP <110 07/03/15  Yes [provider]  guaiFENesin (MUCINEX) 600 MG 12 hr tablet Take 600 mg by mouth 2 (two) times daily.   Yes [provider]  insulin glargine (LANTUS) 100 UNIT/ML injection Inject 26 Units into the skin at bedtime.    Yes [provider]  insulin lispro (HUMALOG) 100 UNIT/ML injection Inject 8-12 Units into the skin daily. If BS is 150-250 give 8 units If BS is greater than 250 give 12 units   Yes [provider]  insulin lispro (HUMALOG) 100 UNIT/ML injection Inject 12-15 Units into the skin 2 (two) times daily. Per sliding scale   Give 12 units if CBG between 150-250 Give 15 units if CGB > 250   Yes [provider]  ipratropium-albuterol (DUONEB) 0.5-2.5 (3) MG/3ML SOLN Take 3 mLs by nebulization every 8 (eight) hours as needed.    Yes [provider]  ipratropium-albuterol (DUONEB) 0.5-2.5 (3) MG/3ML SOLN Take 3 mLs by nebulization 2 (two) times daily.   Yes [provider]  lidocaine (LIDODERM) 5 % Place 1 patch onto the skin daily. Apply to left neck for pain Remove & Discard patch within 12 hours or as directed by MD   Yes [provider]  losartan (COZAAR) 50 MG tablet  Take 50 mg by mouth 2 (two) times daily.   Yes [provider]  Metoprolol Tartrate 75 MG TABS Take 75 mg by mouth 2 (two) times daily. Hold med if SBP <110 OR HR < 60 b/min   Yes [provider]  mineral oil-hydrophilic petrolatum (AQUAPHOR) ointment Apply 1 application daily as needed topically for dry skin (to the left upper arm).   Yes [provider]  Multiple Vitamins-Minerals (THERAVIM-M PO) Take 1 tablet by mouth daily.   Yes [provider]  omeprazole (PRILOSEC) 20 MG capsule Take 20 mg by mouth daily.  02/14/14  Yes [provider]  predniSONE (DELTASONE) 5 MG tablet Take 5 mg by mouth daily with breakfast.   Yes [provider]  saccharomyces boulardii (FLORASTOR) 250 MG capsule Take 250 mg by mouth 2 (two) times daily.   Yes [provider]  spironolactone (ALDACTONE) 25 MG tablet Take 25 mg by mouth daily.  11/03/14  Yes [provider]  traZODone (DESYREL) 50 MG tablet Take 50 mg by mouth daily.    Yes [provider]  trolamine salicylate (ASPERCREME) 10 % cream Apply 1 application topically 3 (three) times daily. Apply to right knee   Yes [provider]  zinc oxide 20 % ointment Apply 1 application topically as needed for irritation. Apply to buttocks as needed   Yes [provider]  OXYGEN Inhale 1.5 L/min into the lungs.     [provider]    Physical Exam: Vitals:   02/17/18 1354 02/17/18 1448 02/17/18 1600 02/17/18 1745  BP: (!) 123/50  (!) 120/59 (!) 143/48  Pulse: 67  62 74  Resp: 20  19 18   Temp: 98.2 F (36.8 C) 99.6 F (37.6 C)    TempSrc: Oral Rectal    SpO2:   99% 97%    Constitutional: NAD, mildly uncomfrotable Vitals:   02/17/18 1354 02/17/18 1448 02/17/18 1600 02/17/18 1745  BP: (!) 123/50  (!) 120/59 (!) 143/48  Pulse: 67  62 74  Resp: 20  19 18   Temp: 98.2 F (36.8 C) 99.6 F (37.6 C)    TempSrc: Oral Rectal    SpO2:   99% 97%   Eyes:  anicteric sclera ENMT: dry MM,edentuolous.  Neck: normal, supple, no masses, no thyromegaly Respiratory: no increased WOB, scattered rhonchi, no wheezes or rales  Cardiovascular: RRR, nl S1/s2 no murmurs  Abdomen:  Distended, soft, diffuse tender, no rebound or guarding Musculoskeletal: trace LE edea  Skin: no rashes over visible skin, hemoatoma over lower extremities Neurologic: grossly intact, moving all extremities Psychiatric: Normal judgment and insight. Alert and oriented x 3. Normal mood.    Labs on Admission: I have personally reviewed following labs and imaging studies  CBC: Recent Labs  Lab 02/17/18 1419  WBC 14.7*  NEUTROABS 12.3*  HGB 10.6*  HCT 34.3*  MCV 103.9*  PLT 932   Basic Metabolic Panel: Recent Labs  Lab 02/17/18 1419  NA 139  K 5.7*  CL 101  CO2 26  GLUCOSE 179*  BUN 84*  CREATININE 1.70*  CALCIUM 9.2   GFR: Estimated Creatinine Clearance: 22.1 mL/min (A) (by C-G formula based on SCr of 1.7 mg/dL (H)). Liver Function Tests: Recent Labs  Lab 02/17/18 1419  AST 22  ALT 14  ALKPHOS 124  BILITOT 0.7  PROT 6.7  ALBUMIN 3.5   No results for input(s): LIPASE, AMYLASE in the last 168 hours. No results for input(s): AMMONIA in the last 168 hours. Coagulation Profile: No results for input(s): INR, PROTIME in the last 168 hours. Cardiac Enzymes: No results for input(s): CKTOTAL, CKMB, CKMBINDEX, TROPONINI in the last 168 hours. BNP (last 3 results) No results for input(s): PROBNP in the last 8760 hours. HbA1C: No results for input(s): HGBA1C in the last 72 hours. CBG: No results for input(s): GLUCAP in the last 168 hours. Lipid Profile: No results for input(s): CHOL, HDL, LDLCALC, TRIG, CHOLHDL, LDLDIRECT in the last 72 hours. Thyroid Function Tests: No results for input(s):  TSH, T4TOTAL, FREET4, T3FREE, THYROIDAB in the last 72 hours. Anemia Panel: No results for input(s): VITAMINB12, FOLATE, FERRITIN, TIBC, IRON, RETICCTPCT in the last  72 hours. Urine analysis:    Component Value Date/Time   COLORURINE YELLOW 02/17/2018 1631   APPEARANCEUR HAZY (A) 02/17/2018 1631   LABSPEC 1.017 02/17/2018 1631   PHURINE 5.0 02/17/2018 1631   GLUCOSEU NEGATIVE 02/17/2018 1631   HGBUR NEGATIVE 02/17/2018 1631   BILIRUBINUR NEGATIVE 02/17/2018 1631   [place] 02/17/2018 1631   PROTEINUR NEGATIVE 02/17/2018 1631   UROBILINOGEN 0.2 11/24/2014 1210   NITRITE NEGATIVE 02/17/2018 1631   LEUKOCYTESUR LARGE (A) 02/17/2018 1631    Radiological Exams on Admission: Ct Abdomen Pelvis Wo Contrast  Result Date: 02/17/2018 CLINICAL DATA:  Abdominal distention. Lower extremity swelling. Shortness of breath. Back pain. EXAM: CT ABDOMEN AND PELVIS WITHOUT CONTRAST TECHNIQUE: Multidetector CT imaging of the abdomen and pelvis was performed following the standard protocol without IV contrast. COMPARISON:  CT scan of the pelvis dated 09/13/2008 and CT scan of the chest dated 10/17/2009 FINDINGS: Lower chest: With slight atelectasis or scarring at the left lung base posteriorly. Aortic atherosclerosis. Heart size is normal. No pericardial effusion. Tiny hiatal hernia. Hepatobiliary: 7 mm low-density lesion in the left lobe of the liver adjacent to the falciform ligament, minimally more prominent than on the prior study. Multiple stones in the nondistended gallbladder. The common bile duct measures 12 mm in diameter. There is dense material in the region of the distal common bile duct and ampulla which could represent stones in the distal common bile duct. Patient's bilirubin is not elevated. Pancreas: Diffuse atrophy of the pancreas. No pancreatic ductal dilatation. Spleen: Normal in size without focal abnormality. Adrenals/Urinary Tract: There is a 14 mm lesion which appears to be associated with the right renal artery. 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 Kidneys are normal.  Adrenal glands are normal.  Bladder is normal. Stomach/Bowel:  Evidence of previous gastric bypass. There are a few diverticula in the colon. Small bowel and appendix appear normal. Vascular/Lymphatic: Extensive aortic atherosclerosis. No enlarged abdominal or pelvic lymph nodes. Reproductive: Calcified fibroids in the small uterus. 3.1 x 4.5 cm low-density lesion in the left ovary, increased from 2.2 cm on the prior study. Right ovary appears normal. Other: Tiny umbilical hernia containing only fat.  No ascites. Musculoskeletal: There is a mild benign-appearing compression fracture of the superior endplate of L2, age indeterminate. IMPRESSION: 1. Cholelithiasis with dilatation of the common bile duct with possible stones in the distal common bile duct. 2. 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 3. 4.5 cm low-density lesion in the left ovary, slightly increased in size since the prior CT scan of 2010. This is indeterminate. 4. Benign-appearing mild compression fracture of the superior endplate of L2, age indeterminate. 5.  Aortic Atherosclerosis (ICD10-I70.0). Electronically Signed   By: Lorriane Shire M.D.   On: 02/17/2018 16:58   Dg Chest Port 1 View  Result Date: 02/17/2018 CLINICAL DATA:  Shortness of breath, distended abdomen, lower extremity swelling, history bronchiectasis, gastric ulcer, COPD, coronary artery disease, hypertension, type II diabetes mellitus, former smoker EXAM: PORTABLE CHEST 1 VIEW COMPARISON:  Portable exam 1358 hours compared to 11/24/2014 FINDINGS: Borderline enlargement of cardiac silhouette. Mediastinal contours and pulmonary vascularity normal. Atherosclerotic calcification aorta. Minimal bronchitic changes and bibasilar atelectasis. Lungs otherwise clear. No definite pleural effusion or pneumothorax. Bones demineralized with advanced LEFT glenohumeral degenerative changes. IMPRESSION: Bronchitic changes with bibasilar atelectasis. Electronically Signed  By: Lavonia Dana M.D.   On: 02/17/2018 14:50    EKG: Independently reviewed.  pending  Assessment/Plan Active Problems:   Chronic combined systolic and diastolic congestive heart failure (HCC)   CKD (chronic kidney disease) stage 3, GFR 30-59 ml/min (HCC)   Hypertensive heart and kidney disease with chronic combined systolic and diastolic congestive heart failure and stage 3 chronic kidney disease (HCC)   Type 2 diabetes with decreased circulation (HCC)   AKI (acute kidney injury) ([place])   #) Lethargy/weakness: unclear etiology however strongly suspect infectious causing. Ddx includes UTI, biliary source - IVF and PT - start cefepime and metronidazole on 02/17/2018 - f/u blood cx from 02/17/2018 - f/u urine cx from 02/17/2018  #) KGY:JEHUDJ secondary to dehydration and possible infection - gentle IVF - hold ARB  #) HyperK: pending EKG - tele - IVF - will give kayexlate  #) Dilated CBD: Pt does not have RUQ pain and has a normal bilirubin - recheck CMP in AM - GI c/s - will consider HIDA scan  - NPO at MN  #) Chronic grade 2 DD: mildly volume depleted - hold furosemide  #) HTN: - cont metoprolol 75mg  QD - hold ARB  #) COPD on 2LNC:not wheezing - cont PRN bronchodilators - cont LABA/ICS  FEN: - fluids: gentle IVF - electrolytes: monitor and supp - nutrition: NPO MN  Prophy: SCDs  Dispo: pending improved AKI  DNR  Cristy Folks MD [facility]  If 7PM-7AM, please contact night-coverage [email or web address] Password [facility]  02/17/2018, 7:49 PM

## 2018-02-17 NOTE — Progress Notes (Signed)
Location:    [facility]ursing Home Room Number: 16 Place of Service:  SNF (31) Provider:   Virgie Dad, MD  Patient Care Team: Virgie Dad, MD as PCP - General (Internal Medicine) Clent Jacks, MD (Ophthalmology) Adrian Prows, MD as Attending Physician (Cardiology) [place], [facility] [name], MD as Consulting Physician (Orthopedic Surgery) Clance, Armando Reichert, MD as Consulting Physician (Pulmonary Disease) Mast, Man X, NP as Nurse Practitioner (Internal Medicine)  Extended Emergency Contact Information Primary Emergency Contact: Halloran,[name] Address: [address]          [place], [place] [address] [place] Phone: [phone] Mobile Phone: [phone] Relation: Son Secondary Emergency Contact: [name] [place] Mobile Phone: [phone] Relation: Daughter  Code Status:  DNR Goals of care: Advanced Directive information Advanced Directives 02/17/2018  Does Patient Have a Medical Advance Directive? Yes  Type of Paramedic of [place];Out of facility DNR (pink MOST or yellow form)  Does patient want to make changes to medical advance directive? No - Patient declined  Copy of [place] in Chart? Yes - validated most recent copy scanned in chart (See row information)  Pre-existing out of facility DNR order (yellow form or pink MOST form) Yellow form placed in chart (order not valid for inpatient use)     Chief Complaint  Patient presents with  . Acute Visit    Weakness and Fever    HPI:  Pt is a 85 y.o. female seen today for an acute visit for Weakness and fever Patient has h/o COPD on Oxygen, Diabetes, Hypertension and Chronic diastolic CHF. Per Nurses patient has had change of status in past few days. She has been weak and lethargic. Unable to get from the bed.and do any transfers. She did fall few days ago when staff was helping her with transfer and sustained Hematoma in both Her  lower extremities. When I went to see patient she was in the bed very uncomfortable complaining of pain in her neck and her back in both her legs.  She had a fever of 100.4 temporal.  She denied any cough or shortness of breath.  Her only complaint was pain and weakness..    Past Medical History:  Diagnosis Date  . Abnormality of gait   . Anemia, unspecified   . Arthritis   . Bronchiectasis without acute exacerbation ([place]) 04/10/2011   CT chest 2011   . Cataract 1990   Dr. Katy Fitch  . Cholelithiases   . Closed fracture of unspecified part of ramus of mandible   . COPD (chronic obstructive pulmonary disease) ([place])   . Coronary atherosclerosis of unspecified type of vessel, native or graft   . Disturbance of skin sensation   . DOE (dyspnea on exertion) 04/10/2011   PFTs 2013:  FEV1 1.04 (78%), ratio 64, couldn't do maneuver for lung volumes or dlco   . Edema   . Esophageal reflux   . Fall from other slipping, tripping, or stumbling   . Gastric ulcer with hemorrhage 2008  . Hypertension   . Hypoxemia   . Insomnia, unspecified   . Mucopurulent chronic bronchitis ([place])   . Nodule of neck 11/07/2013   Left neck posteriorly   . Nontoxic uninodular goiter   . Osteoarthrosis, unspecified whether generalized or localized, unspecified site   . Other and unspecified hyperlipidemia   . Other malaise and fatigue   . Pain in joint, lower leg 2010   right knee  .  Pain in joint, shoulder region 2013   left  . Palpitations   . Pneumonia, organism unspecified(486)   . Regional enteritis of unspecified site   . Rosacea   . Sciatica   . Squamous cell carcinoma of skin of lower limb, including hip 07/27/2012   Nonhealing lesion of the left mid calf medially   . Tachycardia 12/06/2014  . Tension headache   . Tension headache   . Type II or unspecified type diabetes mellitus without mention of complication, not stated as uncontrolled   . Unspecified venous (peripheral) insufficiency   . Urine,  incontinence, stress female 07/19/2013   Past Surgical History:  Procedure Laterality Date  . EYE SURGERY Bilateral 1990   Dr. Katy Fitch  . GASTROJEJUNOSTOMY  05/2004   secondary to ulcer  . JOINT REPLACEMENT Bilateral 1993-1998   total knee replacement  . MOLE REMOVAL  05/11/14   left hand and left side of [facility]  . OTHER SURGICAL HISTORY  2008   Ulcer surgery   . SMALL INTESTINE SURGERY    . TOTAL KNEE ARTHROPLASTY Bilateral 1993, 1998   knees    Allergies  Allergen Reactions  . Adhesive [Tape]     unknown  . Aspirin     unknown  . Augmentin [Amoxicillin-Pot Clavulanate]   . Codeine     unknown  . Diclofenac   . Enalapril Cough  . Hydrocodone     unknown  . Pantoprazole Sodium     unknown  . Voltaren [Diclofenac Sodium]     Outpatient Encounter Medications as of 02/17/2018  Medication Sig  . acetaminophen (TYLENOL) 500 MG tablet Take 1,000 mg by mouth 2 (two) times daily.   . ertapenem 1 g in sodium chloride 0.9 % 100 mL Inject 1 g into the vein every morning. Reconstitute medication with 3Ml lidocaine for administration  . Fluticasone-Salmeterol (ADVAIR) 100-50 MCG/DOSE AEPB Inhale 1 puff into the lungs 2 (two) times daily.  . furosemide (LASIX) 40 MG tablet Take 40 mg by mouth 2 (two) times daily. Hold for SBP <110  . guaiFENesin (MUCINEX) 600 MG 12 hr tablet Take 600 mg by mouth 2 (two) times daily.  . insulin glargine (LANTUS) 100 UNIT/ML injection Inject 26 Units into the skin at bedtime.   . insulin lispro (HUMALOG) 100 UNIT/ML cartridge Inject 12-15 Units into the skin daily. Give 12 units if CBG between 150-250 give 15 units if CBG > 250  . insulin lispro (HUMALOG) 100 UNIT/ML injection Inject 8-12 Units into the skin 2 (two) times daily between meals. give 8 units if CBG between 150-250 give 12 units if CBG > 250  . ipratropium-albuterol (DUONEB) 0.5-2.5 (3) MG/3ML SOLN Take 3 mLs by nebulization every 8 (eight) hours as needed.   Marland Kitchen  ipratropium-albuterol (DUONEB) 0.5-2.5 (3) MG/3ML SOLN Take 3 mLs by nebulization 2 (two) times daily.  Marland Kitchen lidocaine (LIDODERM) 5 % Place 1 patch onto the skin daily. Apply to left neck for pain Remove & Discard patch within 12 hours or as directed by MD  . losartan (COZAAR) 50 MG tablet Take 50 mg by mouth 2 (two) times daily.  . Metoprolol Tartrate 75 MG TABS Take 75 mg by mouth 2 (two) times daily.  . mineral oil-hydrophilic petrolatum (AQUAPHOR) ointment Apply 1 application daily as needed topically for dry skin (to the left upper arm).  Marland Kitchen omeprazole (PRILOSEC) 20 MG capsule Take 20 mg by mouth daily.   . OXYGEN Inhale 1.5 L/min into the lungs.   Marland Kitchen  predniSONE (DELTASONE) 5 MG tablet Take 5 mg by mouth daily with breakfast.  . saccharomyces boulardii (FLORASTOR) 250 MG capsule Take 250 mg by mouth 2 (two) times daily.  Marland Kitchen spironolactone (ALDACTONE) 25 MG tablet Take 25 mg by mouth daily.   . traZODone (DESYREL) 50 MG tablet Take 50 mg by mouth daily.   Marland Kitchen trolamine salicylate (ASPERCREME) 10 % cream Apply 1 application topically 3 (three) times daily. Apply to right knee  . zinc oxide 20 % ointment Apply 1 application topically as needed for irritation. Apply to buttocks as needed   No facility-administered encounter medications on file as of 02/17/2018.     Review of Systems  Constitutional: Positive for activity change and appetite change.  HENT: Negative.   Respiratory: Negative for cough and shortness of breath.   Cardiovascular: Positive for leg swelling.  Gastrointestinal: Negative.   Genitourinary: Negative.   Musculoskeletal: Positive for arthralgias, back pain and neck pain.  Skin: Negative.   Neurological: Positive for weakness.  Psychiatric/Behavioral: Negative.     Immunization History  Administered Date(s) Administered  . Influenza Split 11/06/2011, 11/04/2012  . Influenza,inj,Quad PF,6+ Mos 11/17/2013  . Influenza-Unspecified 11/09/2014, 11/16/2015, 11/25/2015,  11/13/2016, 11/16/2017  . PPD Test 06/19/2010, 11/29/2014  . Pneumococcal Conjugate-13 08/11/2014  . Pneumococcal Polysaccharide-23 02/04/2003  . Pneumococcal-Unspecified 06/07/2014  . Tdap 02/27/2013  . Zoster 02/03/2006   Pertinent  Health Maintenance Due  Topic Date Due  . OPHTHALMOLOGY EXAM  12/16/2018 (Originally 07/22/2017)  . DEXA SCAN  07/18/2026 (Originally 10/31/1984)  . FOOT EXAM  06/06/2018  . HEMOGLOBIN A1C  06/17/2018  . INFLUENZA VACCINE  Completed  . PNA vac Low Risk Adult  Completed   Fall Risk  02/02/2017 12/06/2014 07/11/2014 05/22/2014 11/07/2013  Falls in the past year? - Yes No Yes No  Number falls in past yr: - 1 - 1 -  Injury with Fall? - No - No -  Risk for fall due to : History of fall(s);Impaired mobility History of fall(s);Impaired mobility - History of fall(s);Impaired balance/gait -  Follow up - - - Falls evaluation completed -   Functional Status Survey:    Vitals:   02/17/18 1027  BP: (!) 160/86  Pulse: 88  Resp: 18  Temp: (!) 100.4 F (38 C)  SpO2: 96%  Weight: 213 lb (96.6 kg)  Height: 5\' 7"  (1.702 m)   Body mass index is 33.36 kg/m. Physical Exam Constitutional:      Comments: Does respond but Sleepy  HENT:     Head: Normocephalic.     Nose: Nose normal.     Mouth/Throat:     Mouth: Mucous membranes are moist.     Pharynx: Oropharynx is clear.  Eyes:     Pupils: Pupils are equal, round, and reactive to light.  Neck:     Musculoskeletal: Neck supple.  Cardiovascular:     Rate and Rhythm: Regular rhythm. Tachycardia present.  Pulmonary:     Effort: Pulmonary effort is normal. No respiratory distress.     Breath sounds: Normal breath sounds. No wheezing or rales.  Abdominal:     General: Abdomen is flat. Bowel sounds are normal. There is no distension.     Palpations: Abdomen is soft.     Tenderness: There is no abdominal tenderness. There is no guarding.  Musculoskeletal:     Comments: Has redness and Hematoma in Both her Lower  Extremities which was tender and Warm  Skin:    General: Skin is warm and dry.  Neurological:     General: No focal deficit present.     Mental Status: She is alert and oriented to person, place, and time.     Comments: Patient seems Lethargic. She does Respond Approprately  Psychiatric:        Mood and Affect: Mood normal.        Thought Content: Thought content normal.        Judgment: Judgment normal.     Labs reviewed: Recent Labs    04/24/17 06/18/17 09/17/17 10/01/17  NA 140 138  138 137 136  K 4.7 4.6  4.6 4.7 4.6  CL 100 97  --   --   CO2 36 34  --   --   BUN 47* 41*  41* 58* 62*  CREATININE 1.2* 1.1  1.09 1.28 1.33  CALCIUM 9.0 9.1  9.1 9.0 9.1   Recent Labs    04/24/17 09/17/17  AST 15 15  ALT 11 10  ALKPHOS 106 89  BILITOT  --  0.2  PROT 5.3 5.3  ALBUMIN 3.2 3.3   Recent Labs    04/24/17 06/18/17 09/17/17  WBC 7.9 8.5  8.5 9.1  HGB 10.0* 10.9*  10.9 10.4  HCT 30* 32*  32.2 31.4  MCV  --   --  95.7  PLT 186 216  --    Lab Results  Component Value Date   TSH 3.48 09/17/2017   Lab Results  Component Value Date   HGBA1C 6.9 12/17/2017   Lab Results  Component Value Date   CHOL 117 12/15/2016   HDL 52 12/15/2016   LDLCALC 40 12/15/2016   TRIG 186 (A) 12/15/2016    Significant Diagnostic Results in last 30 days:  No results found.  Assessment/Plan  Fever with Leucocytosis and Lethargy She had Stat Labs done in the Facility and Her White Count is elevated at 11.5 with Increased Neutrophils. Her BUN was also up to 72 Patient seems to be septic with possible source of Cellulitis. She initially refused to go to Hospital. But was d/w her son as she feels Really bad she has agreed to go to ED.  Family/ staff Communication:   Labs/tests ordered:   Total time spent in this patient care encounter was 45_ minutes; greater than 50% of the visit spent counseling patient, reviewing records , Labs and coordinating care for problems addressed at  this encounter.

## 2018-02-17 NOTE — ED Triage Notes (Signed)
Patient is from [facility] and transported via [facility]. According to EMS, patient has "abnormal labs". When EMS arrived, patient was short of breath, distended abd, and lower extremity swelling. RR was 34 with room air oxygen of 88%. Currently, patient is on antibiotics for UTI and cellulitis.

## 2018-02-17 NOTE — ED Provider Notes (Signed)
[facility] DEPT Provider Note   CSN: [id number] Arrival date & time: 02/17/18  1333     History   Chief Complaint Chief Complaint  Patient presents with  . Shortness of Breath  . Abnormal Lab  . Leg Swelling    HPI [name] is a 85 y.o. female presenting for evaluation of weakness, SOB, and swelling.   Pt states she has been feeling week over the past several days. She reports increasing pain in her back, but has chronic back pain. Pt states she finished abx yesterday for UTI and skin infection. Pt "slid" several days ago, denies hitting her head or LOC. Pt does not know if she is on blood thinners. She denies recent fevers, cough, cp, abd pain, urinary sxs. Pt states she is currently having a BM while I'm in the room.   Additional history obtained from chart review.  Patient was seen by her PCP today, was found to have an elevated white count and a temperature of 100.4.  There is concern for possible sepsis with a possible cellulitis source, as she has recently been being treated with ertapenem for UTI and cellulitis.  Pt with a history of COPD on 1 L of oxygen at baseline, hypertension, chronic pain, and diabetes.  HPI  Past Medical History:  Diagnosis Date  . Abnormality of gait   . Anemia, unspecified   . Arthritis   . Bronchiectasis without acute exacerbation ([place]) 04/10/2011   CT chest 2011   . Cataract 1990   Dr. Katy Fitch  . Cholelithiases   . Closed fracture of unspecified part of ramus of mandible   . COPD (chronic obstructive pulmonary disease) ([place])   . Coronary atherosclerosis of unspecified type of vessel, native or graft   . Disturbance of skin sensation   . DOE (dyspnea on exertion) 04/10/2011   PFTs 2013:  FEV1 1.04 (78%), ratio 64, couldn't do maneuver for lung volumes or dlco   . Edema   . Esophageal reflux   . Fall from other slipping, tripping, or stumbling   . Gastric ulcer with hemorrhage 2008  . Hypertension   .  Hypoxemia   . Insomnia, unspecified   . Mucopurulent chronic bronchitis ([place])   . Nodule of neck 11/07/2013   Left neck posteriorly   . Nontoxic uninodular goiter   . Osteoarthrosis, unspecified whether generalized or localized, unspecified site   . Other and unspecified hyperlipidemia   . Other malaise and fatigue   . Pain in joint, lower leg 2010   right knee  . Pain in joint, shoulder region 2013   left  . Palpitations   . Pneumonia, organism unspecified(486)   . Regional enteritis of unspecified site   . Rosacea   . Sciatica   . Squamous cell carcinoma of skin of lower limb, including hip 07/27/2012   Nonhealing lesion of the left mid calf medially   . Tachycardia 12/06/2014  . Tension headache   . Tension headache   . Type II or unspecified type diabetes mellitus without mention of complication, not stated as uncontrolled   . Unspecified venous (peripheral) insufficiency   . Urine, incontinence, stress female 07/19/2013    Patient Active Problem List   Diagnosis Date Noted  . AKI (acute kidney injury) ([place]) 02/17/2018  . Fall 02/16/2018  . Altered mental state 02/16/2018  . Traumatic hematoma of left lower leg 02/14/2018  . Type 2 diabetes with decreased circulation ([place]) 08/05/2017  . Chronic respiratory failure  with hypoxia ([place]) 12/22/2016  . Hypertensive heart and kidney disease with chronic combined systolic and diastolic congestive heart failure and stage 3 chronic kidney disease ([place]) 09/28/2016  . Current chronic use of systemic steroids 08/11/2016  . OAB (overactive bladder) 08/11/2016  . CKD (chronic kidney disease) stage 3, GFR 30-59 ml/min ([facility]) 07/11/2016  . Cutaneous horn 04/08/2016  . Dizziness 03/11/2016  . Occipital neuralgia of left side 08/28/2015  . Venous stasis dermatitis of both lower extremities 08/28/2015  . Tinnitus 08/24/2015  . Chronic combined systolic and diastolic congestive heart failure ([place]) 12/08/2014  . Anemia of chronic disease  12/08/2014  . Tachycardia 12/06/2014  . Constipation 12/05/2014  . Hyponatremia 11/24/2014  . Generalized weakness 05/22/2014  . Insomnia 11/21/2013  . Nodule of neck 11/07/2013  . Nocturia 09/13/2013  . Hyperlipidemia 07/19/2013  . Urine, incontinence, stress female 07/19/2013  . Controlled type 2 diabetes mellitus with stage 3 chronic kidney disease, with long-term current use of insulin ([place]) 05/24/2013  . Edema 05/24/2013  . Esophageal reflux   . Pain in joint, shoulder region 07/27/2012  . Arthritis   . Other malaise and fatigue   . Mucopurulent chronic bronchitis ([place])   . Pain in joint, lower leg   . COPD (chronic obstructive pulmonary disease) ([place]) 02/06/2012  . DOE (dyspnea on exertion) 04/10/2011  . Bronchiectasis without acute exacerbation ([place]) 04/10/2011  . Pulmonary nodules 04/10/2011    Past Surgical History:  Procedure Laterality Date  . EYE SURGERY Bilateral 1990   Dr. Katy Fitch  . GASTROJEJUNOSTOMY  05/2004   secondary to ulcer  . JOINT REPLACEMENT Bilateral 1993-1998   total knee replacement  . MOLE REMOVAL  05/11/14   left hand and left side of [facility]  . OTHER SURGICAL HISTORY  2008   Ulcer surgery   . SMALL INTESTINE SURGERY    . TOTAL KNEE ARTHROPLASTY Bilateral 1993, 1998   knees     OB History   No obstetric history on file.      Home Medications    Prior to Admission medications   Medication Sig Start Date End Date Taking? Authorizing Provider  acetaminophen (TYLENOL) 500 MG tablet Take 1,000 mg by mouth 2 (two) times daily.    Yes [provider]  Fluticasone-Salmeterol (ADVAIR) 100-50 MCG/DOSE AEPB Inhale 1 puff into the lungs 2 (two) times daily.   Yes [provider]  furosemide (LASIX) 40 MG tablet Take 40 mg by mouth 2 (two) times daily. Hold for SBP <110 07/03/15  Yes [provider]  guaiFENesin (MUCINEX) 600 MG 12 hr tablet Take 600 mg by mouth 2 (two) times daily.   Yes [provider]  insulin glargine (LANTUS) 100 UNIT/ML injection Inject 26 Units into the skin at bedtime.    Yes [provider]  insulin lispro (HUMALOG) 100 UNIT/ML injection Inject 8-12 Units into the skin daily. If BS is 150-250 give 8 units If BS is greater than 250 give 12 units   Yes [provider]  insulin lispro (HUMALOG) 100 UNIT/ML injection Inject 12-15 Units into the skin 2 (two) times daily. Per sliding scale   Give 12 units if CBG between 150-250 Give 15 units if CGB > 250   Yes [provider]  ipratropium-albuterol (DUONEB) 0.5-2.5 (3) MG/3ML SOLN Take 3 mLs by nebulization every 8 (eight) hours as needed.    Yes [provider]  ipratropium-albuterol (DUONEB) 0.5-2.5 (3) MG/3ML SOLN Take 3 mLs by nebulization 2 (  two) times daily.   Yes [provider]  lidocaine (LIDODERM) 5 % Place 1 patch onto the skin daily. Apply to left neck for pain Remove & Discard patch within 12 hours or as directed by MD   Yes [provider]  losartan (COZAAR) 50 MG tablet Take 50 mg by mouth 2 (two) times daily.   Yes [provider]  Metoprolol Tartrate 75 MG TABS Take 75 mg by mouth 2 (two) times daily. Hold med if SBP <110 OR HR < 60 b/min   Yes [provider]  mineral oil-hydrophilic petrolatum (AQUAPHOR) ointment Apply 1 application daily as needed topically for dry skin (to the left upper arm).   Yes [provider]  Multiple Vitamins-Minerals (THERAVIM-M PO) Take 1 tablet by mouth daily.   Yes [provider]  omeprazole (PRILOSEC) 20 MG capsule Take 20 mg by mouth daily.  02/14/14  Yes [provider]  predniSONE (DELTASONE) 5 MG tablet Take 5 mg by mouth daily with breakfast.   Yes [provider]  saccharomyces boulardii (FLORASTOR) 250 MG capsule Take 250 mg by mouth 2 (two) times daily.   Yes [provider]  spironolactone (ALDACTONE) 25 MG tablet Take 25 mg by mouth daily.   11/03/14  Yes [provider]  traZODone (DESYREL) 50 MG tablet Take 50 mg by mouth daily.    Yes [provider]  trolamine salicylate (ASPERCREME) 10 % cream Apply 1 application topically 3 (three) times daily. Apply to right knee   Yes [provider]  zinc oxide 20 % ointment Apply 1 application topically as needed for irritation. Apply to buttocks as needed   Yes [provider]  OXYGEN Inhale 1.5 L/min into the lungs.     [provider]    Family History Family History  Problem Relation Age of Onset  . Heart disease Father   . Colon cancer Sister   . Diabetes Brother   . Obesity Brother   . Mental retardation Daughter   . Obesity Daughter     Social History Social History   Tobacco Use  . Smoking status: Former Smoker    Packs/day: 0.30    Years: 10.00    Pack years: 3.00    Types: Cigarettes    Last attempt to quit: 02/03/1973    Years since quitting: 45.0  . Smokeless tobacco: Never Used  . Tobacco comment: former casual smoker, lots of second hand smoke exposure  Substance Use Topics  . Alcohol use: No  . Drug use: No     Allergies   Adhesive [tape]; Aspirin; Augmentin [amoxicillin-pot clavulanate]; Codeine; Diclofenac; Enalapril; Hydrocodone; Pantoprazole sodium; and Voltaren [diclofenac sodium]   Review of Systems Review of Systems  Respiratory: Positive for shortness of breath.   Skin: Positive for color change.  Neurological: Positive for weakness.  All other systems reviewed and are negative.    Physical Exam Updated Vital Signs BP (!) 143/48   Pulse 74   Temp 99.6 F (37.6 C) (Rectal)   Resp 18   SpO2 97%   Physical Exam Vitals signs and nursing note reviewed.  Constitutional:      General: She is not in acute distress.    Appearance: She is well-developed.  HENT:     Head: Normocephalic and atraumatic.     Mouth/Throat:     Mouth: Mucous membranes are dry.  Eyes:     Conjunctiva/sclera:  Conjunctivae normal.     Pupils: Pupils are equal,  round, and reactive to light.  Neck:     Musculoskeletal: Normal range of motion and neck supple.  Cardiovascular:     Rate and Rhythm: Normal rate and regular rhythm.     Pulses: Normal pulses.  Pulmonary:     Effort: Pulmonary effort is normal. No respiratory distress.     Breath sounds: Normal breath sounds. No wheezing.     Comments: Speaking in full sentences. On O2 at baseline. In no respiratory distress.  Abdominal:     General: There is distension.     Palpations: Abdomen is soft. There is no mass.     Tenderness: There is no abdominal tenderness. There is no guarding or rebound.     Comments: Abdomen is swollen, but no tenderness.  No rigidity.  Negative rebound.  Musculoskeletal: Normal range of motion.        General: Tenderness present.     Comments: Bilateral erythema of lower legs.  Contusions and hematomas in various stages of healing noted on all 4 extremities, worse in the lower legs.  Skin:    General: Skin is warm and dry.     Capillary Refill: Capillary refill takes less than 2 seconds.  Neurological:     Mental Status: She is alert and oriented to person, place, and time.      ED Treatments / Results  Labs (all labs ordered are listed, but only abnormal results are displayed) Labs Reviewed  COMPREHENSIVE METABOLIC PANEL - Abnormal; Notable for the following components:      Result Value   Potassium 5.7 (*)    Glucose, Bld 179 (*)    BUN 84 (*)    Creatinine, Ser 1.70 (*)    GFR calc non Af Amer 25 (*)    GFR calc Af Amer 29 (*)    All other components within normal limits  CBC WITH DIFFERENTIAL/PLATELET - Abnormal; Notable for the following components:   WBC 14.7 (*)    RBC 3.30 (*)    Hemoglobin 10.6 (*)    HCT 34.3 (*)    MCV 103.9 (*)    Neutro Abs 12.3 (*)    Abs Immature Granulocytes 0.19 (*)    All other components within normal limits  URINALYSIS, ROUTINE W REFLEX MICROSCOPIC - Abnormal;  Notable for the following components:   APPearance HAZY (*)    Leukocytes, UA LARGE (*)    Bacteria, UA RARE (*)    All other components within normal limits  CULTURE, BLOOD (ROUTINE X 2)  CULTURE, BLOOD (ROUTINE X 2)  GASTROINTESTINAL PANEL BY PCR, STOOL (REPLACES STOOL CULTURE)  URINE CULTURE  I-STAT CG4 LACTIC ACID, ED    EKG None  Radiology Ct Abdomen Pelvis Wo Contrast  Result Date: 02/17/2018 CLINICAL DATA:  Abdominal distention. Lower extremity swelling. Shortness of breath. Back pain. EXAM: CT ABDOMEN AND PELVIS WITHOUT CONTRAST TECHNIQUE: Multidetector CT imaging of the abdomen and pelvis was performed following the standard protocol without IV contrast. COMPARISON:  CT scan of the pelvis dated 09/13/2008 and CT scan of the chest dated 10/17/2009 FINDINGS: Lower chest: With slight atelectasis or scarring at the left lung base posteriorly. Aortic atherosclerosis. Heart size is normal. No pericardial effusion. Tiny hiatal hernia. Hepatobiliary: 7 mm low-density lesion in the left lobe of the liver adjacent to the falciform ligament, minimally more prominent than on the prior study. Multiple stones in the nondistended gallbladder. The common bile duct measures 12 mm in diameter. There is dense material in the region of  the distal common bile duct and ampulla which could represent stones in the distal common bile duct. Patient's bilirubin is not elevated. Pancreas: Diffuse atrophy of the pancreas. No pancreatic ductal dilatation. Spleen: Normal in size without focal abnormality. Adrenals/Urinary Tract: There is a 14 mm lesion which appears to be associated with the right renal artery. 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 Kidneys are normal.  Adrenal glands are normal.  Bladder is normal. Stomach/Bowel: Evidence of previous gastric bypass. There are a few diverticula in the colon. Small bowel and appendix appear normal. Vascular/Lymphatic: Extensive aortic atherosclerosis. No  enlarged abdominal or pelvic lymph nodes. Reproductive: Calcified fibroids in the small uterus. 3.1 x 4.5 cm low-density lesion in the left ovary, increased from 2.2 cm on the prior study. Right ovary appears normal. Other: Tiny umbilical hernia containing only fat.  No ascites. Musculoskeletal: There is a mild benign-appearing compression fracture of the superior endplate of L2, age indeterminate. IMPRESSION: 1. Cholelithiasis with dilatation of the common bile duct with possible stones in the distal common bile duct. 2. 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 3. 4.5 cm low-density lesion in the left ovary, slightly increased in size since the prior CT scan of 2010. This is indeterminate. 4. Benign-appearing mild compression fracture of the superior endplate of L2, age indeterminate. 5.  Aortic Atherosclerosis (ICD10-I70.0). Electronically Signed   By: Lorriane Shire M.D.   On: 02/17/2018 16:58   Dg Chest Port 1 View  Result Date: 02/17/2018 CLINICAL DATA:  Shortness of breath, distended abdomen, lower extremity swelling, history bronchiectasis, gastric ulcer, COPD, coronary artery disease, hypertension, type II diabetes mellitus, former smoker EXAM: PORTABLE CHEST 1 VIEW COMPARISON:  Portable exam 1358 hours compared to 11/24/2014 FINDINGS: Borderline enlargement of cardiac silhouette. Mediastinal contours and pulmonary vascularity normal. Atherosclerotic calcification aorta. Minimal bronchitic changes and bibasilar atelectasis. Lungs otherwise clear. No definite pleural effusion or pneumothorax. Bones demineralized with advanced LEFT glenohumeral degenerative changes. IMPRESSION: Bronchitic changes with bibasilar atelectasis. Electronically Signed   By: Lavonia Dana M.D.   On: 02/17/2018 14:50    Procedures Procedures (including critical care time)  Medications Ordered in ED Medications  sodium chloride 0.9 % bolus 500 mL (500 mLs Intravenous New Bag/Given 02/17/18 1749)     Initial Impression /  Assessment and Plan / ED Course  I have reviewed the triage vital signs and the nursing notes.  Pertinent labs & imaging results that were available during my care of the patient were reviewed by me and considered in my medical decision making (see chart for details).   Patient presenting for evaluation of weakness.  PCP was concerned, patient with a fever and elevated white count, concern for possible sepsis although patient just recently finished a course of antibiotics for UTI and cellulitis.  On exam, patient was reporting shortness of breath, but no signs of shortness of breath.  She is stable on her home oxygen at 1 L, and no respiratory distress.  No concerning lung sounds.  Patient with abdominal swelling, I do not know if this is new, but per EMS facility members implied this was new.  As such, will obtain labs, urine, chest x-ray, and CT abdomen pelvis for further evaluation.  Labs with hyperkalemia and new AKI.  Urine with large leuks, but rare bacteria.  Urine sent for culture, but this is not obviously infected-no recent to compare.  Patient with leukocytosis at 15, however lactic is reassuring.  Will hold on antibiotics for right now.  GI stool panel sent.  Due to patient's creatinine, unable to obtain imaging with contrast, will obtain without.  CT without shows possible stone in the distal common bile duct, however patient without abdominal tenderness, nausea, or vomiting.  Low suspicion for emergent gallbladder pathology at this time.  Additionally, CT shows lesion of the ovary, which is grown in size.  Stomach classified as benign versus malignant per radiology.  Considering patient's elevation white count, hyperkalemia, AKI, and worsening weakness over the past several days, will call for admission.  Discussed with Dr. Herbert Moors from [facility], pt to be admitted. Requesting GI consult. Pt's last GI evaluation was in 2004, will call on call GI.   Consult deferred by someone else in epic  (unknown reason). I did not speak to GI about this patient.    Final Clinical Impressions(s) / ED Diagnoses   Final diagnoses:  None    ED Discharge Orders    None       Franchot Heidelberg, PA-C 02/17/18 Renelda Loma, MD 02/18/18 [phone]

## 2018-02-18 ENCOUNTER — Other Ambulatory Visit: Payer: Self-pay

## 2018-02-18 LAB — GASTROINTESTINAL PANEL BY PCR, STOOL (REPLACES STOOL CULTURE)
Adenovirus F40/41: NOT DETECTED
Astrovirus: NOT DETECTED
CRYPTOSPORIDIUM: NOT DETECTED
CYCLOSPORA CAYETANENSIS: NOT DETECTED
Campylobacter species: NOT DETECTED
Entamoeba histolytica: NOT DETECTED
Enteroaggregative E coli (EAEC): NOT DETECTED
Enteropathogenic E coli (EPEC): NOT DETECTED
Enterotoxigenic E coli (ETEC): NOT DETECTED
Giardia lamblia: NOT DETECTED
Norovirus GI/GII: NOT DETECTED
Plesimonas shigelloides: NOT DETECTED
Rotavirus A: NOT DETECTED
SAPOVIRUS (I, II, IV, AND V): NOT DETECTED
Salmonella species: NOT DETECTED
Shiga like toxin producing E coli (STEC): NOT DETECTED
Shigella/Enteroinvasive E coli (EIEC): NOT DETECTED
VIBRIO SPECIES: NOT DETECTED
Vibrio cholerae: NOT DETECTED
Yersinia enterocolitica: NOT DETECTED

## 2018-02-18 LAB — MRSA PCR SCREENING: MRSA by PCR: NEGATIVE

## 2018-02-18 LAB — COMPREHENSIVE METABOLIC PANEL
ALT: 14 U/L (ref 0–44)
AST: 20 U/L (ref 15–41)
Albumin: 3.3 g/dL — ABNORMAL LOW (ref 3.5–5.0)
Alkaline Phosphatase: 98 U/L (ref 38–126)
BUN: 73 mg/dL — ABNORMAL HIGH (ref 8–23)
CO2: 27 mmol/L (ref 22–32)
Calcium: 9.1 mg/dL (ref 8.9–10.3)
Chloride: 107 mmol/L (ref 98–111)
Creatinine, Ser: 1.41 mg/dL — ABNORMAL HIGH (ref 0.44–1.00)
GFR calc Af Amer: 36 mL/min — ABNORMAL LOW (ref >60)
GFR calc non Af Amer: 31 mL/min — ABNORMAL LOW (ref >60)
Glucose, Bld: 108 mg/dL — ABNORMAL HIGH (ref 70–99)
Potassium: 4.8 mmol/L (ref 3.5–5.1)
Sodium: 145 mmol/L (ref 135–145)
Total Bilirubin: 0.8 mg/dL (ref 0.3–1.2)
Total Protein: 6.5 g/dL (ref 6.5–8.1)

## 2018-02-18 LAB — CBG MONITORING, ED
Glucose-Capillary: 122 mg/dL — ABNORMAL HIGH (ref 70–99)
Glucose-Capillary: 153 mg/dL — ABNORMAL HIGH (ref 70–99)
Glucose-Capillary: 95 mg/dL (ref 70–99)

## 2018-02-18 LAB — C DIFFICILE QUICK SCREEN W PCR REFLEX
C Diff antigen: POSITIVE — AB
C Diff interpretation: DETECTED
C Diff toxin: POSITIVE — AB

## 2018-02-18 LAB — CBC
HCT: 35.9 % — ABNORMAL LOW (ref 36.0–46.0)
Hemoglobin: 10.8 g/dL — ABNORMAL LOW (ref 12.0–15.0)
MCH: 31.8 pg (ref 26.0–34.0)
MCHC: 30.1 g/dL (ref 30.0–36.0)
MCV: 105.6 fL — ABNORMAL HIGH (ref 80.0–100.0)
Platelets: 220 10*3/uL (ref 150–400)
RBC: 3.4 MIL/uL — ABNORMAL LOW (ref 3.87–5.11)
RDW: 12.6 % (ref 11.5–15.5)
WBC: 12.3 10*3/uL — ABNORMAL HIGH (ref 4.0–10.5)
nRBC: 0 % (ref 0.0–0.2)

## 2018-02-18 LAB — APTT: aPTT: 30 s (ref 24–36)

## 2018-02-18 LAB — PROTIME-INR
INR: 0.89
Prothrombin Time: 12 seconds (ref 11.4–15.2)

## 2018-02-18 LAB — COMPREHENSIVE METABOLIC PANEL WITH GFR: Anion gap: 11 (ref 5–15)

## 2018-02-18 LAB — GLUCOSE, CAPILLARY: Glucose-Capillary: 171 mg/dL — ABNORMAL HIGH (ref 70–99)

## 2018-02-18 MED ORDER — ALBUTEROL SULFATE (2.5 MG/3ML) 0.083% IN NEBU
5.0000 mg | INHALATION_SOLUTION | Freq: Once | RESPIRATORY_TRACT | Status: AC
  Administered 2018-02-18: 5 mg via RESPIRATORY_TRACT
  Filled 2018-02-18: qty 6

## 2018-02-18 MED ORDER — HEPARIN SODIUM (PORCINE) 5000 UNIT/ML IJ SOLN
5000.0000 [IU] | Freq: Three times a day (TID) | INTRAMUSCULAR | Status: DC
  Administered 2018-02-18 – 2018-02-21 (×8): 5000 [IU] via SUBCUTANEOUS
  Filled 2018-02-18 (×9): qty 1

## 2018-02-18 MED ORDER — ZINC OXIDE 40 % EX OINT: TOPICAL_OINTMENT | Freq: Two times a day (BID) | CUTANEOUS | Status: DC | PRN

## 2018-02-18 MED ORDER — VANCOMYCIN 50 MG/ML ORAL SOLUTION
125.0000 mg | Freq: Four times a day (QID) | ORAL | Status: DC
  Administered 2018-02-18 – 2018-02-21 (×12): 125 mg via ORAL
  Filled 2018-02-18 (×14): qty 2.5

## 2018-02-18 MED ORDER — ZINC OXIDE 40 % EX OINT
TOPICAL_OINTMENT | Freq: Four times a day (QID) | CUTANEOUS | Status: DC
  Administered 2018-02-18 – 2018-02-20 (×10): via TOPICAL
  Filled 2018-02-18 (×2): qty 57

## 2018-02-18 NOTE — ED Notes (Signed)
CRITICAL VALUE STICKER  CRITICAL VALUE: c-diff positive  RECEIVER (on-site recipient of call):Cari Nancey Kreitz  DATE & TIME NOTIFIED: 02/18/2018 1139  MESSENGER (representative from lab): Raelyn Ensign  MD NOTIFIED:   TIME OF NOTIFICATION:   RESPONSE: Pt currently on enteric precautions

## 2018-02-18 NOTE — Progress Notes (Signed)
PROGRESS NOTE    [name]  RDE:[id number] DOB: 1917/12/31 DOA: 02/17/2018 PCP: Virgie Dad, MD   Brief Narrative: 85 y.o. female with medical history significant of COPD on 2LNC, T2DM on insulin, chronic grade 2 DD by echo on 1013/2015, admitted from SNF with temperature of 100.4 and lethargy and found to have AKI and elevated K. Pt was recently being treated with ESBL with UTI with ertapenem and was noted to have more frequent falls, become increasingly weak and lose ADLs and IADLs, and to have diarrhea and diffuse abdominal pain.  Noted to be C. difficile positive.   Assessment & Plan:   Active Problems:   Chronic combined systolic and diastolic congestive heart failure (HCC)   CKD (chronic kidney disease) stage 3, GFR 30-59 ml/min (HCC)   Hypertensive heart and kidney disease with chronic combined systolic and diastolic congestive heart failure and stage 3 chronic kidney disease (HCC)   Type 2 diabetes with decreased circulation (HCC)   AKI (acute kidney injury) ([facility])    #) Lethargy/weakness/abdominal pain/diarrhea: Most likely source is likely C. difficile colitis as patient was on prolonged course of ertapenem for difficult to treat ESBL UTI at her nursing facility. - IVF  -d/c cefepime and metronidazole 02/17/2018 2 02/18/2018 -  start p.o. vancomycin 125 every 6 hours on 02/18/2018 - blood cx from 02/17/2018 no growth to date -  urine cx from 02/17/2018 No growth to date  #) AKI: Resolving with IV fluids - gentle IVF - hold ARB  #) HyperK: Resolved with IV fluids and Kayexalate - tele  #) Dilated CBD: Pt does not have RUQ pain and has a normal bilirubin.  - recheck CMP in AM - will consider HIDA scan if patient becomes symptomatic  #) T2DM on insulin: -Continue glargine 26 units nightly -Hold home mealtime insulin -Sliding scale insulin, AC at bedtime  #) Chronic grade 2 DD: mildly volume depleted - hold furosemide  #) HTN: - cont metoprolol 75mg   QD - hold ARB  #) COPD on 2LNC:not wheezing - cont PRN bronchodilators - cont LABA/ICS  FEN: - fluids: gentle IVF - electrolytes: monitor and supp - nutrition: Carb restricted diet  Prophy: Subcu heparin  Dispo: pending improved AKI and diminished abdominal pain diarrhea  DNR   Consultants:   None  Procedures:  None  Antimicrobials:  IV cefepime and metronidazole 02/18/2020 02/18/2018  P.o. vancomycin 02/18/2018   Subjective: This morning the patient reports she is doing fairly well but continues to have significant amounts of diarrhea.  She also has some abdominal pain.  She is quite uncomfortable.  She is also noted to have a C. difficile positive and was started on oral vancomycin.  Objective: Vitals:   02/18/18 1030 02/18/18 1100 02/18/18 1115 02/18/18 1130  BP: 133/63 (!) 131/59  (!) 171/68  Pulse: (!) 104 (!) 102 100 (!) 108  Resp: 20 19 19  (!) 28  Temp:      TempSrc:      SpO2: 94% 94% 96% 97%    Intake/Output Summary (Last 24 hours) at 02/18/2018 1212 Last data filed at 02/17/2018 1820 Gross per 24 hour  Intake 499.98 ml  Output -  Net 499.98 ml   There were no vitals filed for this visit.  Examination:  General exam: Appears calm and comfortable  Respiratory system: Clear to auscultation. Respiratory effort normal. Cardiovascular system: Regular rate and rhythm, no murmurs Gastrointestinal system: Soft, mildly distended, diffusely tender to palpation throughout, no rebound or guarding,  hyperactive bowel sounds. Central nervous system: Alert and oriented.  Grossly intact, moving all extremities Extremities: Trace lower extremity edema Skin: Bilateral lower extremity contusions over leg Psychiatry: Judgement and insight appear normal. Mood & affect appropriate.     Data Reviewed: I have personally reviewed following labs and imaging studies  CBC: Recent Labs  Lab 02/17/18 1419 02/18/18 0420  WBC 14.7* 12.3*  NEUTROABS 12.3*  --    HGB 10.6* 10.8*  HCT 34.3* 35.9*  MCV 103.9* 105.6*  PLT 241 361   Basic Metabolic Panel: Recent Labs  Lab 02/17/18 1419 02/18/18 0420  NA 139 145  K 5.7* 4.8  CL 101 107  CO2 26 27  GLUCOSE 179* 108*  BUN 84* 73*  CREATININE 1.70* 1.41*  CALCIUM 9.2 9.1   GFR: Estimated Creatinine Clearance: 26.6 mL/min (A) (by C-G formula based on SCr of 1.41 mg/dL (H)). Liver Function Tests: Recent Labs  Lab 02/17/18 1419 02/18/18 0420  AST 22 20  ALT 14 14  ALKPHOS 124 98  BILITOT 0.7 0.8  PROT 6.7 6.5  ALBUMIN 3.5 3.3*   No results for input(s): LIPASE, AMYLASE in the last 168 hours. No results for input(s): AMMONIA in the last 168 hours. Coagulation Profile: Recent Labs  Lab 02/18/18 0420  INR 0.89   Cardiac Enzymes: No results for input(s): CKTOTAL, CKMB, CKMBINDEX, TROPONINI in the last 168 hours. BNP (last 3 results) No results for input(s): PROBNP in the last 8760 hours. HbA1C: No results for input(s): HGBA1C in the last 72 hours. CBG: Recent Labs  Lab 02/18/18 0038 02/18/18 0913 02/18/18 1155  GLUCAP 95 153* 122*   Lipid Profile: No results for input(s): CHOL, HDL, LDLCALC, TRIG, CHOLHDL, LDLDIRECT in the last 72 hours. Thyroid Function Tests: No results for input(s): TSH, T4TOTAL, FREET4, T3FREE, THYROIDAB in the last 72 hours. Anemia Panel: No results for input(s): VITAMINB12, FOLATE, FERRITIN, TIBC, IRON, RETICCTPCT in the last 72 hours. Sepsis Labs: Recent Labs  Lab 02/17/18 1420  LATICACIDVEN 1.63    Recent Results (from the past 240 hour(s))  Blood Culture (routine x 2)     Status: None (Preliminary result)   Collection Time: 02/17/18  2:19 PM  Result Value Ref Range Status   Specimen Description   Final    BLOOD LEFT FOREARM Performed at [facility], [place] [address] [place], [place] [address]    Special Requests   Final    BOTTLES DRAWN AEROBIC AND ANAEROBIC Blood Culture adequate volume Performed at [facility] [address], [place], [place] [address]    Culture   Final    NO GROWTH < 24 HOURS Performed at [facility] [address] [place], [place] [address]    Report Status PENDING  Incomplete  Blood Culture (routine x 2)     Status: None (Preliminary result)   Collection Time: 02/17/18  2:57 PM  Result Value Ref Range Status   Specimen Description   Final    BLOOD RIGHT ANTECUBITAL Performed at [facility] [address] [place], [place] [address]    Special Requests   Final    BOTTLES DRAWN AEROBIC AND ANAEROBIC Blood Culture adequate volume Performed at [facility] [address] [place], [place] [address]    Culture   Final    NO GROWTH < 24 HOURS Performed at [facility] [address], [place], [place] [address]    Report Status PENDING  Incomplete  C difficile quick scan w PCR reflex     Status: Abnormal   Collection Time: 02/18/18  9:44 AM  Result Value Ref Range Status   C Diff antigen POSITIVE (A) NEGATIVE Final   C Diff toxin POSITIVE (A) NEGATIVE Final   C Diff interpretation Toxin producing C. difficile detected.  Final    Comment: CRITICAL RESULT CALLED TO, READ BACK BY AND VERIFIED WITH: SIMPSON, C RN @1138  ON 1.16.2020 BY [facility] Performed at [facility], [place] [address] [place], [place] [address]          Radiology Studies: Ct Abdomen Pelvis Wo Contrast  Result Date: 02/17/2018 CLINICAL DATA:  Abdominal distention. Lower extremity swelling. Shortness of breath. Back pain. EXAM: CT ABDOMEN AND PELVIS WITHOUT CONTRAST TECHNIQUE: Multidetector CT imaging of the abdomen and pelvis was performed following the standard protocol without IV contrast. COMPARISON:  CT scan of the pelvis dated 09/13/2008 and CT scan of the chest dated 10/17/2009 FINDINGS: Lower chest: With slight atelectasis or scarring at the left lung base posteriorly. Aortic atherosclerosis. Heart size  is normal. No pericardial effusion. Tiny hiatal hernia. Hepatobiliary: 7 mm low-density lesion in the left lobe of the liver adjacent to the falciform ligament, minimally more prominent than on the prior study. Multiple stones in the nondistended gallbladder. The common bile duct measures 12 mm in diameter. There is dense material in the region of the distal common bile duct and ampulla which could represent stones in the distal common bile duct. Patient's bilirubin is not elevated. Pancreas: Diffuse atrophy of the pancreas. No pancreatic ductal dilatation. Spleen: Normal in size without focal abnormality. Adrenals/Urinary Tract: There is a 14 mm lesion which appears to be associated with the right renal artery. 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 Kidneys are normal.  Adrenal glands are normal.  Bladder is normal. Stomach/Bowel: Evidence of previous gastric bypass. There are a few diverticula in the colon. Small bowel and appendix appear normal. Vascular/Lymphatic: Extensive aortic atherosclerosis. No enlarged abdominal or pelvic lymph nodes. Reproductive: Calcified fibroids in the small uterus. 3.1 x 4.5 cm low-density lesion in the left ovary, increased from 2.2 cm on the prior study. Right ovary appears normal. Other: Tiny umbilical hernia containing only fat.  No ascites. Musculoskeletal: There is a mild benign-appearing compression fracture of the superior endplate of L2, age indeterminate. IMPRESSION: 1. Cholelithiasis with dilatation of the common bile duct with possible stones in the distal common bile duct. 2. 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 3. 4.5 cm low-density lesion in the left ovary, slightly increased in size since the prior CT scan of 2010. This is indeterminate. 4. Benign-appearing mild compression fracture of the superior endplate of L2, age indeterminate. 5.  Aortic Atherosclerosis (ICD10-I70.0). Electronically Signed   By: Lorriane Shire M.D.   On: 02/17/2018 16:58   Dg  Chest Port 1 View  Result Date: 02/17/2018 CLINICAL DATA:  Shortness of breath, distended abdomen, lower extremity swelling, history bronchiectasis, gastric ulcer, COPD, coronary artery disease, hypertension, type II diabetes mellitus, former smoker EXAM: PORTABLE CHEST 1 VIEW COMPARISON:  Portable exam 1358 hours compared to 11/24/2014 FINDINGS: Borderline enlargement of cardiac silhouette. Mediastinal contours and pulmonary vascularity normal. Atherosclerotic calcification aorta. Minimal bronchitic changes and bibasilar atelectasis. Lungs otherwise clear. No definite pleural effusion or pneumothorax. Bones demineralized with advanced LEFT glenohumeral degenerative changes. IMPRESSION: Bronchitic changes with bibasilar atelectasis. Electronically Signed   By: Lavonia Dana M.D.   On: 02/17/2018 14:50  Scheduled Meds: . insulin aspart  0-5 Units Subcutaneous QHS  . insulin aspart  0-9 Units Subcutaneous TID WC  . insulin glargine  26 Units Subcutaneous QHS  . lidocaine  1 patch Transdermal Q24H  . metoprolol tartrate  75 mg Oral BID  . mometasone-formoterol  2 puff Inhalation BID  . predniSONE  5 mg Oral Q breakfast  . saccharomyces boulardii  250 mg Oral BID  . traZODone  50 mg Oral Daily  . vancomycin  125 mg Oral Q6H   Continuous Infusions: . sodium chloride Stopped (02/18/18 0604)     LOS: 1 day    Time spent: [place], MD [facility]  If 7PM-7AM, please contact night-coverage [email or web address] Password [email or web address] 02/18/2018, 12:12 PM

## 2018-02-19 ENCOUNTER — Inpatient Hospital Stay (HOSPITAL_COMMUNITY): Payer: Medicare Other

## 2018-02-19 LAB — CBC
HCT: 33.2 % — ABNORMAL LOW (ref 36.0–46.0)
Hemoglobin: 9.9 g/dL — ABNORMAL LOW (ref 12.0–15.0)
MCH: 31.9 pg (ref 26.0–34.0)
MCHC: 29.8 g/dL — ABNORMAL LOW (ref 30.0–36.0)
MCV: 107.1 fL — ABNORMAL HIGH (ref 80.0–100.0)
Platelets: 221 10*3/uL (ref 150–400)
RBC: 3.1 MIL/uL — ABNORMAL LOW (ref 3.87–5.11)
RDW: 12.5 % (ref 11.5–15.5)
WBC: 11.8 10*3/uL — ABNORMAL HIGH (ref 4.0–10.5)
nRBC: 0 % (ref 0.0–0.2)

## 2018-02-19 LAB — COMPREHENSIVE METABOLIC PANEL
ALT: 13 U/L (ref 0–44)
AST: 24 U/L (ref 15–41)
Albumin: 2.9 g/dL — ABNORMAL LOW (ref 3.5–5.0)
Alkaline Phosphatase: 84 U/L (ref 38–126)
Anion gap: 9 (ref 5–15)
BUN: 48 mg/dL — ABNORMAL HIGH (ref 8–23)
CO2: 25 mmol/L (ref 22–32)
Calcium: 8.7 mg/dL — ABNORMAL LOW (ref 8.9–10.3)
Chloride: 112 mmol/L — ABNORMAL HIGH (ref 98–111)
Creatinine, Ser: 1.25 mg/dL — ABNORMAL HIGH (ref 0.44–1.00)
GFR calc Af Amer: 41 mL/min — ABNORMAL LOW (ref >60)
GFR calc non Af Amer: 36 mL/min — ABNORMAL LOW (ref >60)
Glucose, Bld: 161 mg/dL — ABNORMAL HIGH (ref 70–99)
Potassium: 4.6 mmol/L (ref 3.5–5.1)
Sodium: 146 mmol/L — ABNORMAL HIGH (ref 135–145)
Total Protein: 5.9 g/dL — ABNORMAL LOW (ref 6.5–8.1)

## 2018-02-19 LAB — URINE CULTURE: Culture: 40000 — AB

## 2018-02-19 LAB — GLUCOSE, CAPILLARY
Glucose-Capillary: 160 mg/dL — ABNORMAL HIGH (ref 70–99)
Glucose-Capillary: 149 mg/dL — ABNORMAL HIGH (ref 70–99)
Glucose-Capillary: 125 mg/dL — ABNORMAL HIGH (ref 70–99)
Glucose-Capillary: 90 mg/dL (ref 70–99)
Glucose-Capillary: 95 mg/dL (ref 70–99)

## 2018-02-19 LAB — COMPREHENSIVE METABOLIC PANEL WITH GFR: Total Bilirubin: 0.6 mg/dL (ref 0.3–1.2)

## 2018-02-19 NOTE — Care Management Important Message (Signed)
Important Message  Patient Details  Name: [name] MRN: [id number] Date of Birth: 02-Apr-1917   Medicare Important Message Given:  Yes. CMA gave IM to patient's Care Management Nurse.    Kimela Malstrom 02/19/2018, 9:33 AM

## 2018-02-19 NOTE — Progress Notes (Signed)
PROGRESS NOTE    [name]  VZD:[id number] DOB: [birth year] DOA: 02/17/2018 PCP: Virgie Dad, MD   Brief Narrative: 85 y.o. female with medical history significant of COPD on 2LNC, T2DM on insulin, chronic grade 2 DD by echo on 1013/2015, admitted from SNF with temperature of 100.4 and lethargy and found to have AKI and elevated K. Pt was recently being treated with ESBL with UTI with ertapenem and was noted to have more frequent falls, become increasingly weak and lose ADLs and IADLs, and to have diarrhea and diffuse abdominal pain.  Noted to be C. difficile positive.   Assessment & Plan:   Active Problems:   Chronic combined systolic and diastolic congestive heart failure (HCC)   CKD (chronic kidney disease) stage 3, GFR 30-59 ml/min (HCC)   Hypertensive heart and kidney disease with chronic combined systolic and diastolic congestive heart failure and stage 3 chronic kidney disease (HCC)   Type 2 diabetes with decreased circulation (HCC)   AKI (acute kidney injury) ([place])    #) Lethargy/weakness/abdominal pain/diarrhea: Most likely source is likely C. difficile colitis as patient was on prolonged course of ertapenem for difficult to treat ESBL UTI at her nursing facility. - Continue p.o. vancomycin 125 every 6 hours on 02/18/2018 - blood cx from 02/17/2018 no growth to date -  urine cx from 02/17/2018 No growth to date  #) COPD on 2LNC:not wheezing the patient is more tachypneic.  Unclear if patient has COPD exacerbation or possible fluid overload from diastolic dysfunction due to IV fluids.  Will check chest x-ray today. - cont PRN bronchodilators - cont LABA/ICS  #) AKI: Resolved with IV fluids - hold ARB  #) HyperK: Resolved with IV fluids and Kayexalate - tele ending today  #) Dilated CBD: Pt does not have RUQ pain and has a normal bilirubin.  Additionally patient does not appear to be particularly symptomatic from this.  #) T2DM on insulin: -Continue glargine 26  units nightly -Hold home mealtime insulin -Sliding scale insulin, AC at bedtime  #) Chronic grade 2 DD: mildly volume depleted - hold furosemide  #) HTN: - cont metoprolol 75mg  QD - hold ARB   FEN: - fluids: Tolerating p.o. - electrolytes: monitor and supp - nutrition: Carb restricted diet  Prophy: Subcu heparin  Dispo: pending improved AKI and diminished abdominal pain diarrhea  DNR   Consultants:   None  Procedures:  None  Antimicrobials:  IV cefepime and metronidazole 02/18/2020 02/18/2018  P.o. vancomycin 02/18/2018   Subjective: This morning the patient reports she is doing somewhat better.  She continues to have multiple bowel movements.  She has less abdominal pain and has not had any nausea or vomiting.  She is not chest pain, shortness of breath, fevers, cough, congestion.  Objective: Vitals:   02/18/18 1645 02/18/18 2016 02/18/18 2219 02/19/18 0521  BP: (!) 155/65 (!) 133/56 (!) 151/44 (!) 158/68  Pulse: 86 77 80 78  Resp: (!) 22   (!) 24  Temp: 98.2 F (36.8 C) 97.7 F (36.5 C)  98.1 F (36.7 C)  TempSrc: Oral Oral  Oral  SpO2:  96%  90%  Weight: 96.6 kg     Height: 5\' 7"  (1.702 m)       Intake/Output Summary (Last 24 hours) at 02/19/2018 1009 Last data filed at 02/18/2018 1400 Gross per 24 hour  Intake 397.5 ml  Output -  Net 397.5 ml   Filed Weights   02/18/18 1645  Weight: 96.6 kg  Examination:  General exam: Appears calm and comfortable  Respiratory system: Mildly increased work of breathing, scattered rhonchi in bilateral upper bases, intermittent wheezing. Cardiovascular system: Regular rate and rhythm, no murmurs Gastrointestinal system: Soft, mildly distended, diffusely tender to palpation throughout, no rebound or guarding, hyperactive bowel sounds. Central nervous system: Alert and oriented.  Grossly intact, moving all extremities Extremities: Trace lower extremity edema Skin: Bilateral lower extremity contusions  over leg Psychiatry: Judgement and insight appear normal. Mood & affect appropriate.     Data Reviewed: I have personally reviewed following labs and imaging studies  CBC: Recent Labs  Lab 02/17/18 1419 02/18/18 0420 02/19/18 0709  WBC 14.7* 12.3* 11.8*  NEUTROABS 12.3*  --   --   HGB 10.6* 10.8* 9.9*  HCT 34.3* 35.9* 33.2*  MCV 103.9* 105.6* 107.1*  PLT 241 220 952   Basic Metabolic Panel: Recent Labs  Lab 02/17/18 1419 02/18/18 0420 02/19/18 0709  NA 139 145 146*  K 5.7* 4.8 4.6  CL 101 107 112*  CO2 26 27 25   GLUCOSE 179* 108* 161*  BUN 84* 73* 48*  CREATININE 1.70* 1.41* 1.25*  CALCIUM 9.2 9.1 8.7*   GFR: Estimated Creatinine Clearance: 30 mL/min (A) (by C-G formula based on SCr of 1.25 mg/dL (H)). Liver Function Tests: Recent Labs  Lab 02/17/18 1419 02/18/18 0420 02/19/18 0709  AST 22 20 24   ALT 14 14 13   ALKPHOS 124 98 84  BILITOT 0.7 0.8 0.6  PROT 6.7 6.5 5.9*  ALBUMIN 3.5 3.3* 2.9*   No results for input(s): LIPASE, AMYLASE in the last 168 hours. No results for input(s): AMMONIA in the last 168 hours. Coagulation Profile: Recent Labs  Lab 02/18/18 0420  INR 0.89   Cardiac Enzymes: No results for input(s): CKTOTAL, CKMB, CKMBINDEX, TROPONINI in the last 168 hours. BNP (last 3 results) No results for input(s): PROBNP in the last 8760 hours. HbA1C: No results for input(s): HGBA1C in the last 72 hours. CBG: Recent Labs  Lab 02/18/18 0038 02/18/18 0913 02/18/18 1155 02/18/18 1719  GLUCAP 95 153* 122* 171*   Lipid Profile: No results for input(s): CHOL, HDL, LDLCALC, TRIG, CHOLHDL, LDLDIRECT in the last 72 hours. Thyroid Function Tests: No results for input(s): TSH, T4TOTAL, FREET4, T3FREE, THYROIDAB in the last 72 hours. Anemia Panel: No results for input(s): VITAMINB12, FOLATE, FERRITIN, TIBC, IRON, RETICCTPCT in the last 72 hours. Sepsis Labs: Recent Labs  Lab 02/17/18 1420  LATICACIDVEN 1.63    Recent Results (from the past  240 hour(s))  Blood Culture (routine x 2)     Status: None (Preliminary result)   Collection Time: 02/17/18  2:19 PM  Result Value Ref Range Status   Specimen Description   Final    BLOOD LEFT FOREARM Performed at [facility], [place] [address], [place], [place] [address]    Special Requests   Final    BOTTLES DRAWN AEROBIC AND ANAEROBIC Blood Culture adequate volume Performed at [facility] [address] [place], [place] [address]    Culture   Final    NO GROWTH 2 DAYS Performed at [facility] [address] [place],  [address]    Report Status PENDING  Incomplete  Gastrointestinal Panel by PCR , Stool     Status: None   Collection Time: 02/17/18  2:19 PM  Result Value Ref Range Status   Campylobacter species NOT DETECTED NOT DETECTED Final   Plesimonas shigelloides NOT DETECTED NOT DETECTED Final  Salmonella species NOT DETECTED NOT DETECTED Final   Yersinia enterocolitica NOT DETECTED NOT DETECTED Final   Vibrio species NOT DETECTED NOT DETECTED Final   Vibrio cholerae NOT DETECTED NOT DETECTED Final   Enteroaggregative E coli (EAEC) NOT DETECTED NOT DETECTED Final   Enteropathogenic E coli (EPEC) NOT DETECTED NOT DETECTED Final   Enterotoxigenic E coli (ETEC) NOT DETECTED NOT DETECTED Final   Shiga like toxin producing E coli (STEC) NOT DETECTED NOT DETECTED Final   Shigella/Enteroinvasive E coli (EIEC) NOT DETECTED NOT DETECTED Final   Cryptosporidium NOT DETECTED NOT DETECTED Final   Cyclospora cayetanensis NOT DETECTED NOT DETECTED Final   Entamoeba histolytica NOT DETECTED NOT DETECTED Final   Giardia lamblia NOT DETECTED NOT DETECTED Final   Adenovirus F40/41 NOT DETECTED NOT DETECTED Final   Astrovirus NOT DETECTED NOT DETECTED Final   Norovirus GI/GII NOT DETECTED NOT DETECTED Final   Rotavirus A NOT DETECTED NOT DETECTED Final   Sapovirus (I, II, IV, and V) NOT DETECTED NOT DETECTED Final    Comment:  Performed at [facility], [place] [place], [place] [address]  Blood Culture (routine x 2)     Status: None (Preliminary result)   Collection Time: 02/17/18  2:57 PM  Result Value Ref Range Status   Specimen Description   Final    BLOOD RIGHT ANTECUBITAL Performed at [facility] [address], [place], [place] [address]    Special Requests   Final    BOTTLES DRAWN AEROBIC AND ANAEROBIC Blood Culture adequate volume Performed at [facility] [address], [place], [place] [address]    Culture   Final    NO GROWTH 2 DAYS Performed at [facility] [address] [place], [place] [address]    Report Status PENDING  Incomplete  Urine culture     Status: Abnormal   Collection Time: 02/17/18  4:31 PM  Result Value Ref Range Status   Specimen Description   Final    URINE, CLEAN CATCH Performed at [facility], [place] [address], [place], [place] [address]    Special Requests   Final    NONE Performed at [facility], [place] [address], [place], [place] [address]    Culture 40,000 COLONIES/mL YEAST (A)  Final   Report Status 02/19/2018 FINAL  Final  C difficile quick scan w PCR reflex     Status: Abnormal   Collection Time: 02/18/18  9:44 AM  Result Value Ref Range Status   C Diff antigen POSITIVE (A) NEGATIVE Final   C Diff toxin POSITIVE (A) NEGATIVE Final   C Diff interpretation Toxin producing C. difficile detected.  Final    Comment: CRITICAL RESULT CALLED TO, READ BACK BY AND VERIFIED WITH: SIMPSON, C RN @1138  ON 1.16.2020 BY [facility] Performed at [facility], [place] [address], [place], [place] [address]   MRSA PCR Screening     Status: None   Collection Time: 02/18/18  6:15 PM  Result Value Ref Range Status   MRSA by PCR NEGATIVE NEGATIVE Final    Comment:        The GeneXpert MRSA Assay (FDA approved for NASAL specimens only), is one component of  a comprehensive MRSA colonization surveillance program. It is not intended to diagnose MRSA infection nor to guide or monitor treatment for MRSA infections. Performed at [facility], [place] [address], [place],  [address]  Radiology Studies: Ct Abdomen Pelvis Wo Contrast  Result Date: 02/17/2018 CLINICAL DATA:  Abdominal distention. Lower extremity swelling. Shortness of breath. Back pain. EXAM: CT ABDOMEN AND PELVIS WITHOUT CONTRAST TECHNIQUE: Multidetector CT imaging of the abdomen and pelvis was performed following the standard protocol without IV contrast. COMPARISON:  CT scan of the pelvis dated 09/13/2008 and CT scan of the chest dated 10/17/2009 FINDINGS: Lower chest: With slight atelectasis or scarring at the left lung base posteriorly. Aortic atherosclerosis. Heart size is normal. No pericardial effusion. Tiny hiatal hernia. Hepatobiliary: 7 mm low-density lesion in the left lobe of the liver adjacent to the falciform ligament, minimally more prominent than on the prior study. Multiple stones in the nondistended gallbladder. The common bile duct measures 12 mm in diameter. There is dense material in the region of the distal common bile duct and ampulla which could represent stones in the distal common bile duct. Patient's bilirubin is not elevated. Pancreas: Diffuse atrophy of the pancreas. No pancreatic ductal dilatation. Spleen: Normal in size without focal abnormality. Adrenals/Urinary Tract: There is a 14 mm lesion which appears to be associated with the right renal artery. 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 Kidneys are normal.  Adrenal glands are normal.  Bladder is normal. Stomach/Bowel: Evidence of previous gastric bypass. There are a few diverticula in the colon. Small bowel and appendix appear normal. Vascular/Lymphatic: Extensive aortic atherosclerosis. No enlarged abdominal or pelvic lymph nodes. Reproductive: Calcified fibroids in  the small uterus. 3.1 x 4.5 cm low-density lesion in the left ovary, increased from 2.2 cm on the prior study. Right ovary appears normal. Other: Tiny umbilical hernia containing only fat.  No ascites. Musculoskeletal: There is a mild benign-appearing compression fracture of the superior endplate of L2, age indeterminate. IMPRESSION: 1. Cholelithiasis with dilatation of the common bile duct with possible stones in the distal common bile duct. 2. 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 3. 4.5 cm low-density lesion in the left ovary, slightly increased in size since the prior CT scan of 2010. This is indeterminate. 4. Benign-appearing mild compression fracture of the superior endplate of L2, age indeterminate. 5.  Aortic Atherosclerosis (ICD10-I70.0). Electronically Signed   By: Lorriane Shire M.D.   On: 02/17/2018 16:58   Dg Chest Port 1 View  Result Date: 02/17/2018 CLINICAL DATA:  Shortness of breath, distended abdomen, lower extremity swelling, history bronchiectasis, gastric ulcer, COPD, coronary artery disease, hypertension, type II diabetes mellitus, former smoker EXAM: PORTABLE CHEST 1 VIEW COMPARISON:  Portable exam 1358 hours compared to 11/24/2014 FINDINGS: Borderline enlargement of cardiac silhouette. Mediastinal contours and pulmonary vascularity normal. Atherosclerotic calcification aorta. Minimal bronchitic changes and bibasilar atelectasis. Lungs otherwise clear. No definite pleural effusion or pneumothorax. Bones demineralized with advanced LEFT glenohumeral degenerative changes. IMPRESSION: Bronchitic changes with bibasilar atelectasis. Electronically Signed   By: Lavonia Dana M.D.   On: 02/17/2018 14:50        Scheduled Meds: . heparin injection (subcutaneous)  5,000 Units Subcutaneous Q8H  . insulin aspart  0-5 Units Subcutaneous QHS  . insulin aspart  0-9 Units Subcutaneous TID WC  . insulin glargine  26 Units Subcutaneous QHS  . lidocaine  1 patch Transdermal Q24H  . liver  oil-zinc oxide   Topical QID  . metoprolol tartrate  75 mg Oral BID  . mometasone-formoterol  2 puff Inhalation BID  . predniSONE  5 mg Oral Q breakfast  . saccharomyces boulardii  250 mg Oral BID  . traZODone  50 mg  Oral Daily  . vancomycin  125 mg Oral Q6H   Continuous Infusions: . sodium chloride Stopped (02/18/18 0604)     LOS: 2 days    Time spent: [place], MD [facility]  If 7PM-7AM, please contact night-coverage [email or web address] Password [email or web address] 02/19/2018, 10:09 AM

## 2018-02-20 DIAGNOSIS — D638 Anemia in other chronic diseases classified elsewhere: Secondary | ICD-10-CM | POA: Diagnosis present

## 2018-02-20 DIAGNOSIS — J479 Bronchiectasis, uncomplicated: Secondary | ICD-10-CM

## 2018-02-20 DIAGNOSIS — I5042 Chronic combined systolic (congestive) and diastolic (congestive) heart failure: Secondary | ICD-10-CM

## 2018-02-20 DIAGNOSIS — N183 Chronic kidney disease, stage 3 (moderate): Secondary | ICD-10-CM

## 2018-02-20 DIAGNOSIS — E1151 Type 2 diabetes mellitus with diabetic peripheral angiopathy without gangrene: Secondary | ICD-10-CM

## 2018-02-20 DIAGNOSIS — R531 Weakness: Secondary | ICD-10-CM

## 2018-02-20 DIAGNOSIS — E875 Hyperkalemia: Secondary | ICD-10-CM | POA: Diagnosis present

## 2018-02-20 DIAGNOSIS — R296 Repeated falls: Secondary | ICD-10-CM

## 2018-02-20 DIAGNOSIS — I130 Hypertensive heart and chronic kidney disease with heart failure and stage 1 through stage 4 chronic kidney disease, or unspecified chronic kidney disease: Secondary | ICD-10-CM

## 2018-02-20 DIAGNOSIS — E870 Hyperosmolality and hypernatremia: Secondary | ICD-10-CM | POA: Diagnosis not present

## 2018-02-20 DIAGNOSIS — N390 Urinary tract infection, site not specified: Secondary | ICD-10-CM

## 2018-02-20 DIAGNOSIS — A0472 Enterocolitis due to Clostridium difficile, not specified as recurrent: Secondary | ICD-10-CM | POA: Diagnosis present

## 2018-02-20 DIAGNOSIS — B9629 Other Escherichia coli [E. coli] as the cause of diseases classified elsewhere: Secondary | ICD-10-CM | POA: Diagnosis present

## 2018-02-20 DIAGNOSIS — Z1612 Extended spectrum beta lactamase (ESBL) resistance: Secondary | ICD-10-CM

## 2018-02-20 DIAGNOSIS — J449 Chronic obstructive pulmonary disease, unspecified: Secondary | ICD-10-CM

## 2018-02-20 LAB — GLUCOSE, CAPILLARY
Glucose-Capillary: 101 mg/dL — ABNORMAL HIGH (ref 70–99)
Glucose-Capillary: 112 mg/dL — ABNORMAL HIGH (ref 70–99)
Glucose-Capillary: 115 mg/dL — ABNORMAL HIGH (ref 70–99)
Glucose-Capillary: 56 mg/dL — ABNORMAL LOW (ref 70–99)
Glucose-Capillary: 63 mg/dL — ABNORMAL LOW (ref 70–99)
Glucose-Capillary: 92 mg/dL (ref 70–99)
Glucose-Capillary: 93 mg/dL (ref 70–99)

## 2018-02-20 LAB — BASIC METABOLIC PANEL WITH GFR
Chloride: 110 mmol/L (ref 98–111)
Potassium: 4.3 mmol/L (ref 3.5–5.1)
Sodium: 147 mmol/L — ABNORMAL HIGH (ref 135–145)

## 2018-02-20 LAB — BASIC METABOLIC PANEL
Anion gap: 10 (ref 5–15)
BUN: 42 mg/dL — ABNORMAL HIGH (ref 8–23)
CO2: 27 mmol/L (ref 22–32)
Calcium: 9.4 mg/dL (ref 8.9–10.3)
Creatinine, Ser: 1.11 mg/dL — ABNORMAL HIGH (ref 0.44–1.00)
GFR calc Af Amer: 48 mL/min — ABNORMAL LOW (ref >60)
GFR calc non Af Amer: 41 mL/min — ABNORMAL LOW (ref >60)
Glucose, Bld: 66 mg/dL — ABNORMAL LOW (ref 70–99)

## 2018-02-20 LAB — CBC
HCT: 33.5 % — ABNORMAL LOW (ref 36.0–46.0)
Hemoglobin: 10 g/dL — ABNORMAL LOW (ref 12.0–15.0)
MCH: 32.1 pg (ref 26.0–34.0)
MCHC: 29.9 g/dL — ABNORMAL LOW (ref 30.0–36.0)
MCV: 107.4 fL — ABNORMAL HIGH (ref 80.0–100.0)
Platelets: 209 K/uL (ref 150–400)
RBC: 3.12 MIL/uL — ABNORMAL LOW (ref 3.87–5.11)
RDW: 12.5 % (ref 11.5–15.5)
WBC: 11.4 10*3/uL — ABNORMAL HIGH (ref 4.0–10.5)
nRBC: 0 % (ref 0.0–0.2)

## 2018-02-20 LAB — BRAIN NATRIURETIC PEPTIDE: B Natriuretic Peptide: 569.9 pg/mL — ABNORMAL HIGH (ref 0.0–100.0)

## 2018-02-20 MED ORDER — LORAZEPAM 2 MG/ML IJ SOLN
0.5000 mg | Freq: Once | INTRAMUSCULAR | Status: AC
  Administered 2018-02-20: 0.5 mg via INTRAVENOUS
  Filled 2018-02-20: qty 1

## 2018-02-20 MED ORDER — LACTINEX PO CHEW
2.0000 | CHEWABLE_TABLET | Freq: Three times a day (TID) | ORAL | Status: DC
  Administered 2018-02-20: 2 via ORAL
  Filled 2018-02-20 (×4): qty 2

## 2018-02-20 MED ORDER — BACID PO TABS
2.0000 | ORAL_TABLET | Freq: Three times a day (TID) | ORAL | Status: DC
  Filled 2018-02-20 (×3): qty 2

## 2018-02-20 MED ORDER — IPRATROPIUM-ALBUTEROL 0.5-2.5 (3) MG/3ML IN SOLN: 3.0000 mL | RESPIRATORY_TRACT | Status: DC | PRN

## 2018-02-20 MED ORDER — IPRATROPIUM-ALBUTEROL 0.5-2.5 (3) MG/3ML IN SOLN: 3.0000 mL | Freq: Four times a day (QID) | RESPIRATORY_TRACT | Status: DC

## 2018-02-20 MED ORDER — IPRATROPIUM-ALBUTEROL 0.5-2.5 (3) MG/3ML IN SOLN
3.0000 mL | Freq: Four times a day (QID) | RESPIRATORY_TRACT | Status: DC
  Administered 2018-02-20 – 2018-02-21 (×5): 3 mL via RESPIRATORY_TRACT
  Filled 2018-02-20 (×5): qty 3

## 2018-02-20 MED ORDER — DEXTROSE 50 % IV SOLN: 25.0000 mL | Freq: Once | INTRAVENOUS | Status: DC

## 2018-02-20 MED ORDER — IPRATROPIUM-ALBUTEROL 0.5-2.5 (3) MG/3ML IN SOLN
3.0000 mL | Freq: Four times a day (QID) | RESPIRATORY_TRACT | Status: DC | PRN
  Administered 2018-02-20: 3 mL via RESPIRATORY_TRACT

## 2018-02-20 NOTE — Progress Notes (Signed)
PROGRESS NOTE    [name]  UQJ:[id number] DOB: [birth year] DOA: 02/17/2018 PCP: Virgie Dad, MD   Brief Narrative: 85 y.o. female with medical history significant of COPD/bronchiectasis on 2L home O2, T2DM on insulin with CKD stage 3, chronic combined CHF on 2 diuretics, chronic anemia, HTN, physically debilitated using eletronic scooter and wheelchair, admitted from SNF on 02/17/18 with fever 100.25F, profuse diarrhea and lethargy, and found to have AKI and hyperkalemia. Pt was recently being treated with ESBL with UTI with ertapenem and was noted to have more frequent falls, become increasingly weak and lose ADLs and IADLs, and to have diarrhea and diffuse abdominal pain.  Noted to be C. difficile positive.   Started on po vancomycin and diarrhea mostly subsided. Afebrile. Leukocytosis resolved. AKI and hyperkalemia resolved after IV hydration, kayexalate, and holding off diuretics.    Assessment & Plan:   Principal Problem:   C. difficile colitis Active Problems:   Bronchiectasis without acute exacerbation (HCC)   COPD (chronic obstructive pulmonary disease) (HCC)   Weakness generalized   Chronic combined systolic and diastolic congestive heart failure (HCC)   CKD (chronic kidney disease) stage 3, GFR 30-59 ml/min (HCC)   Hypertensive heart and kidney disease with chronic combined systolic and diastolic congestive heart failure and stage 3 chronic kidney disease (HCC)   Type 2 diabetes with decreased circulation (HCC)   Frequent falls   UTI due to extended-spectrum beta lactamase (ESBL) producing Escherichia coli   Hypernatremia   Anemia, chronic disease    #) Lethargy/weakness/abdominal pain/diarrhea: Most likely source is likely C. difficile colitis as patient was on prolonged course of ertapenem for difficult to treat ESBL UTI at her nursing facility. - Continue p.o. vancomycin 125 every 6 hours for total 10 days (last day 02/28/18); diarrhea has subsided. Avoid other  antibiotics. Add probiotics - blood cx from 02/17/2018 no growth to date -  urine cx from 02/17/2018 No growth to date -- pending PT/OT eval;   #) Hypernatremia  -- inform RN to encourage pt drink water --monitor Na level  #) COPD on 2LNC with SOB: --repeated CXR did not show pulmonary edema or infiltrate --will check BNP - cont PRN bronchodilators - resume LABA/ICS  #) AKI: Resolved with IV fluids - hold ARB and diuretics  #) HyperK: Resolved with IV fluids and Kayexalate  #) Dilated CBD: Pt does not have RUQ pain and has a normal bilirubin.  Additionally patient does not appear to be particularly symptomatic from this.  #) T2DM on insulin: -Continue glargine 26 units nightly -Hold home mealtime insulin -Sliding scale insulin, AC at bedtime  #) Chronic grade 2 DD: mildly volume depleted - hold furosemide  #) HTN: - cont metoprolol 75mg  QD - hold ARB   FEN: - fluids: Tolerating p.o. - electrolytes: monitor and supp - nutrition: Carb restricted diet  Prophy: Subcu heparin  Dispo: will discuss with CM when pt will return to SNF; will have PT/OT eval  DNR   Consultants:   None  Procedures:  None  Antimicrobials:  IV cefepime and metronidazole 02/18/2020 02/18/2018  P.o. vancomycin 02/18/2018, plan to end on 02/28/2018   Subjective: RN reported no BM for 24 hours.  She has no abdominal pain and has not had any nausea or vomiting.  Afebrile. Na 147 (146 yesterday) and urine appears to be dark yellow. Wbc 11.4. pt appears to be pleasantly confused, not sure what's her baseline. RN also reported pt's hearing battery is almost dead.  Objective: Vitals:   02/19/18 2104 02/19/18 2210 02/20/18 0452 02/20/18 0651  BP:  (!) 159/54  (!) 162/65  Pulse:  76  75  Resp:  14  14  Temp:  98.6 F (37 C)  98.6 F (37 C)  TempSrc:  Oral  Oral  SpO2: 100% 100% 96% 97%  Weight:      Height:        Intake/Output Summary (Last 24 hours) at 02/20/2018 1610 Last  data filed at 02/19/2018 2300 Gross per 24 hour  Intake 460 ml  Output -  Net 460 ml   Filed Weights   02/18/18 1645  Weight: 96.6 kg    Examination:  General exam: Appears calm and comfortable but confused (not delirium though) Respiratory system: not in respiratory distress, + scattered rhonchi in bilateral upper bases, intermittent wheezing. Cardiovascular system: Regular rate and rhythm, no murmurs Gastrointestinal system: Soft, mildly distended, non- tender to palpation throughout, no rebound or guarding, positive bowel sounds. Central nervous system: Alert and oriented.  Grossly intact, moving all extremities Extremities: Trace lower extremity edema Skin: Bilateral lower extremity contusions over leg Psychiatry: confused but no agitation    Data Reviewed: I have personally reviewed following labs and imaging studies  CBC: Recent Labs  Lab 02/17/18 1419 02/18/18 0420 02/19/18 0709 02/20/18 0525  WBC 14.7* 12.3* 11.8* 11.4*  NEUTROABS 12.3*  --   --   --   HGB 10.6* 10.8* 9.9* 10.0*  HCT 34.3* 35.9* 33.2* 33.5*  MCV 103.9* 105.6* 107.1* 107.4*  PLT 241 220 221 960   Basic Metabolic Panel: Recent Labs  Lab 02/17/18 1419 02/18/18 0420 02/19/18 0709 02/20/18 0525  NA 139 145 146* 147*  K 5.7* 4.8 4.6 4.3  CL 101 107 112* 110  CO2 26 27 25 27   GLUCOSE 179* 108* 161* 66*  BUN 84* 73* 48* 42*  CREATININE 1.70* 1.41* 1.25* 1.11*  CALCIUM 9.2 9.1 8.7* 9.4   GFR: Estimated Creatinine Clearance: 33.8 mL/min (A) (by C-G formula based on SCr of 1.11 mg/dL (H)). Liver Function Tests: Recent Labs  Lab 02/17/18 1419 02/18/18 0420 02/19/18 0709  AST 22 20 24   ALT 14 14 13   ALKPHOS 124 98 84  BILITOT 0.7 0.8 0.6  PROT 6.7 6.5 5.9*  ALBUMIN 3.5 3.3* 2.9*   No results for input(s): LIPASE, AMYLASE in the last 168 hours. No results for input(s): AMMONIA in the last 168 hours. Coagulation Profile: Recent Labs  Lab 02/18/18 0420  INR 0.89   Cardiac  Enzymes: No results for input(s): CKTOTAL, CKMB, CKMBINDEX, TROPONINI in the last 168 hours. BNP (last 3 results) No results for input(s): PROBNP in the last 8760 hours. HbA1C: No results for input(s): HGBA1C in the last 72 hours. CBG: Recent Labs  Lab 02/19/18 1057 02/19/18 1631 02/19/18 2207 02/20/18 0732 02/20/18 0818  GLUCAP 125* 90 95 56* 93   Lipid Profile: No results for input(s): CHOL, HDL, LDLCALC, TRIG, CHOLHDL, LDLDIRECT in the last 72 hours. Thyroid Function Tests: No results for input(s): TSH, T4TOTAL, FREET4, T3FREE, THYROIDAB in the last 72 hours. Anemia Panel: No results for input(s): VITAMINB12, FOLATE, FERRITIN, TIBC, IRON, RETICCTPCT in the last 72 hours. Sepsis Labs: Recent Labs  Lab 02/17/18 1420  LATICACIDVEN 1.63    Recent Results (from the past 240 hour(s))  Blood Culture (routine x 2)     Status: None (Preliminary result)   Collection Time: 02/17/18  2:19 PM  Result Value Ref Range Status   Specimen Description  Final    BLOOD LEFT FOREARM Performed at [facility] [address] [place], [place] [address]    Special Requests   Final    BOTTLES DRAWN AEROBIC AND ANAEROBIC Blood Culture adequate volume Performed at [facility] [address] [place], [place] [address]    Culture   Final    NO GROWTH 2 DAYS Performed at [facility] [address], [place], [place] [address]    Report Status PENDING  Incomplete  Gastrointestinal Panel by PCR , Stool     Status: None   Collection Time: 02/17/18  2:19 PM  Result Value Ref Range Status   Campylobacter species NOT DETECTED NOT DETECTED Final   Plesimonas shigelloides NOT DETECTED NOT DETECTED Final   Salmonella species NOT DETECTED NOT DETECTED Final   Yersinia enterocolitica NOT DETECTED NOT DETECTED Final   Vibrio species NOT DETECTED NOT DETECTED Final   Vibrio cholerae NOT DETECTED NOT DETECTED Final   Enteroaggregative E coli (EAEC) NOT  DETECTED NOT DETECTED Final   Enteropathogenic E coli (EPEC) NOT DETECTED NOT DETECTED Final   Enterotoxigenic E coli (ETEC) NOT DETECTED NOT DETECTED Final   Shiga like toxin producing E coli (STEC) NOT DETECTED NOT DETECTED Final   Shigella/Enteroinvasive E coli (EIEC) NOT DETECTED NOT DETECTED Final   Cryptosporidium NOT DETECTED NOT DETECTED Final   Cyclospora cayetanensis NOT DETECTED NOT DETECTED Final   Entamoeba histolytica NOT DETECTED NOT DETECTED Final   Giardia lamblia NOT DETECTED NOT DETECTED Final   Adenovirus F40/41 NOT DETECTED NOT DETECTED Final   Astrovirus NOT DETECTED NOT DETECTED Final   Norovirus GI/GII NOT DETECTED NOT DETECTED Final   Rotavirus A NOT DETECTED NOT DETECTED Final   Sapovirus (I, II, IV, and V) NOT DETECTED NOT DETECTED Final    Comment: Performed at [facility], [address] [place], [place] [address]  Blood Culture (routine x 2)     Status: None (Preliminary result)   Collection Time: 02/17/18  2:57 PM  Result Value Ref Range Status   Specimen Description   Final    BLOOD RIGHT ANTECUBITAL Performed at [facility], [place] [address] [place], [place] [address]    Special Requests   Final    BOTTLES DRAWN AEROBIC AND ANAEROBIC Blood Culture adequate volume Performed at [facility] [address], [place], [place] [address]    Culture   Final    NO GROWTH 2 DAYS Performed at [facility] [address], [place], [place] [address]    Report Status PENDING  Incomplete  Urine culture     Status: Abnormal   Collection Time: 02/17/18  4:31 PM  Result Value Ref Range Status   Specimen Description   Final    URINE, CLEAN CATCH Performed at [facility], [place] [address] [place], [place] [address]    Special Requests   Final    NONE Performed at [facility], [place] [address] [place], [place] [address]    Culture 40,000 COLONIES/mL YEAST (A)  Final    Report Status 02/19/2018 FINAL  Final  C difficile quick scan w PCR reflex     Status: Abnormal   Collection Time: 02/18/18  9:44 AM  Result Value Ref Range Status   C Diff antigen POSITIVE (A) NEGATIVE Final   C Diff toxin POSITIVE (A) NEGATIVE Final   C Diff interpretation Toxin producing C. difficile detected.  Final  Comment: CRITICAL RESULT CALLED TO, READ BACK BY AND VERIFIED WITH: SIMPSON, C RN @1138  ON 1.16.2020 BY [facility] Performed at [facility], [place] [address], [place], [place] [address]   MRSA PCR Screening     Status: None   Collection Time: 02/18/18  6:15 PM  Result Value Ref Range Status   MRSA by PCR NEGATIVE NEGATIVE Final    Comment:        The GeneXpert MRSA Assay (FDA approved for NASAL specimens only), is one component of a comprehensive MRSA colonization surveillance program. It is not intended to diagnose MRSA infection nor to guide or monitor treatment for MRSA infections. Performed at [facility], [place] [address] [place], [place] [address]          Radiology Studies: Dg Chest Port 1 View  Result Date: 02/19/2018 CLINICAL DATA:  Shortness of breath EXAM: PORTABLE CHEST 1 VIEW COMPARISON:  02/17/2018 FINDINGS: Mild hyperinflation/COPD. Heart is borderline in size. Mild interstitial prominence without confluent opacity or effusion. IMPRESSION: Mild hyperinflation, COPD.  No active disease. Electronically Signed   By: Rolm Baptise M.D.   On: 02/19/2018 10:30        Scheduled Meds: . heparin injection (subcutaneous)  5,000 Units Subcutaneous Q8H  . insulin aspart  0-5 Units Subcutaneous QHS  . insulin aspart  0-9 Units Subcutaneous TID WC  . insulin glargine  26 Units Subcutaneous QHS  . ipratropium-albuterol  3 mL Nebulization Q6H  . lidocaine  1 patch Transdermal Q24H  . liver oil-zinc oxide   Topical QID  . metoprolol tartrate  75 mg Oral BID  . mometasone-formoterol  2 puff Inhalation BID  .  predniSONE  5 mg Oral Q breakfast  . saccharomyces boulardii  250 mg Oral BID  . traZODone  50 mg Oral Daily  . vancomycin  125 mg Oral Q6H   Continuous Infusions:    LOS: 3 days    Time spent: [place], MD [facility]  If 7PM-7AM, please contact night-coverage [email or web address] Password [facility] 02/20/2018, 8:38 AM

## 2018-02-20 NOTE — Progress Notes (Signed)
Hypoglycemic Event  CBG: 63  Treatment:  236 ml OJ  Symptoms:  asymptomatic/scheduled BG check  Follow-up CBG: Time:2107 CBG Result:92  Possible Reasons for Event:  unknown  Comments/MD notified: X Blount    Robley Fries

## 2018-02-20 NOTE — Progress Notes (Signed)
Hypoglycemic Event  CBG :56  Treatment: 4 oz juice/soda  Symptoms: None  Follow-up CBG: Time 0830 CBG Result:93  Possible Reasons for Event: Unknown  Comments/MD notified: Bing Quarry

## 2018-02-21 LAB — BASIC METABOLIC PANEL
Anion gap: 7 (ref 5–15)
BUN: 34 mg/dL — ABNORMAL HIGH (ref 8–23)
CO2: 28 mmol/L (ref 22–32)
Calcium: 9.2 mg/dL (ref 8.9–10.3)
Chloride: 108 mmol/L (ref 98–111)
Creatinine, Ser: 1 mg/dL (ref 0.44–1.00)
GFR calc Af Amer: 54 mL/min — ABNORMAL LOW (ref >60)
GFR calc non Af Amer: 47 mL/min — ABNORMAL LOW (ref >60)
Glucose, Bld: 90 mg/dL (ref 70–99)
Potassium: 4.3 mmol/L (ref 3.5–5.1)
Sodium: 143 mmol/L (ref 135–145)

## 2018-02-21 LAB — GLUCOSE, CAPILLARY
Glucose-Capillary: 85 mg/dL (ref 70–99)
Glucose-Capillary: 83 mg/dL (ref 70–99)

## 2018-02-21 MED ORDER — VANCOMYCIN 50 MG/ML ORAL SOLUTION: 125.0000 mg | Freq: Four times a day (QID) | ORAL | 0 refills | Status: AC

## 2018-02-21 MED ORDER — LOSARTAN POTASSIUM 25 MG PO TABS: 25.0000 mg | ORAL_TABLET | Freq: Every day | ORAL | 11 refills | Status: AC

## 2018-02-21 NOTE — Discharge Summary (Signed)
Physician Discharge Summary  [name]:[id number] DOB: 11-07-1917 DOA: 02/17/2018  PCP: Virgie Dad, MD  Admit date: 02/17/2018 Discharge date: 02/21/2018  Admitted From:SNF Disposition: SNF  Recommendations for Outpatient Follow-up:  1. Follow up with PCP in 2-3 weeks 2. 7 more days of oral vancomycine 3.  Home Health: no Equipment/Devices: continue home O2 1.5L [place] Discharge Condition: stable CODE STATUS: DNR Diet recommendation: Heart Healthy  Brief/Interim Summary: 85 y.o.femalewith medical history significant ofCOPD/bronchiectasis on 2L home O2, T2DM on insulin with CKD stage 3, chronic combined CHF on 2 diuretics, chronic anemia, HTN, physically debilitated using eletronic scooter and wheelchair, admitted from SNF on 02/17/18 with fever 100.82F, profuse diarrhea and lethargy, and found to have AKI and hyperkalemia. Pt was recently being treated with ESBL with UTI with ertapenem and was noted to have more frequent falls, become increasingly weak and lose ADLs and IADLs, and to have diarrhea and diffuse abdominal pain.  Noted to be C. difficile positive.   Started on po vancomycin and diarrhea mostly subsided. Afebrile. Leukocytosis resolved. AKI and hyperkalemia resolved after IV hydration, kayexalate, and holding off diuretics. she has mild hypernatremia, encouraged her to drink more water.   Upon discharge, pt has no BM for almost 48 hours. Pending BMP. Will continue to hold the two diuretics given mild hypernatremia. Resumed Losartan, continue metoprolol.   COPD stable, continue scheduled and PRN nebs.   BS overall controlled with occasional asymptomatic hypoglycemia due to pt's poor appetite. Nutrition consulted and she's on boost supplement.   Pt does have underlying dementia and at times will be confused and agitated especially being hospitalized.   Discharge Diagnoses:  Principal Problem:   C. difficile colitis Active Problems:   Bronchiectasis without  acute exacerbation (HCC)   COPD (chronic obstructive pulmonary disease) (HCC)   Weakness generalized   Chronic combined systolic and diastolic congestive heart failure (HCC)   CKD (chronic kidney disease) stage 3, GFR 30-59 ml/min (HCC)   Hypertensive heart and kidney disease with chronic combined systolic and diastolic congestive heart failure and stage 3 chronic kidney disease (HCC)   Type 2 diabetes with decreased circulation (HCC)   Frequent falls   UTI due to extended-spectrum beta lactamase (ESBL) producing Escherichia coli   Hypernatremia   Anemia, chronic disease    Discharge Instructions  Discharge Instructions    Call MD for:  temperature >100.4   Complete by:  As directed    Diet - low sodium heart healthy   Complete by:  As directed    Increase activity slowly   Complete by:  As directed      Allergies as of 02/21/2018      Reactions   Adhesive [tape]    unknown   Aspirin    unknown   Augmentin [amoxicillin-pot Clavulanate]    Codeine    unknown   Diclofenac    Enalapril Cough   Hydrocodone    unknown   Pantoprazole Sodium    unknown   Voltaren [diclofenac Sodium]       Medication List    STOP taking these medications   furosemide 40 MG tablet Commonly known as:  LASIX   omeprazole 20 MG capsule Commonly known as:  PRILOSEC   spironolactone 25 MG tablet Commonly known as:  ALDACTONE     TAKE these medications   acetaminophen 500 MG tablet Commonly known as:  TYLENOL Take 1,000 mg by mouth 2 (two) times daily.   Fluticasone-Salmeterol 100-50 MCG/DOSE Aepb Commonly known  as:  ADVAIR Inhale 1 puff into the lungs 2 (two) times daily.   guaiFENesin 600 MG 12 hr tablet Commonly known as:  MUCINEX Take 600 mg by mouth 2 (two) times daily.   insulin glargine 100 UNIT/ML injection Commonly known as:  LANTUS Inject 26 Units into the skin at bedtime.   insulin lispro 100 UNIT/ML injection Commonly known as:  HUMALOG Inject 8-12 Units into the  skin daily. If BS is 150-250 give 8 units If BS is greater than 250 give 12 units   insulin lispro 100 UNIT/ML injection Commonly known as:  HUMALOG Inject 12-15 Units into the skin 2 (two) times daily. Per sliding scale   Give 12 units if CBG between 150-250 Give 15 units if CGB > 250   ipratropium-albuterol 0.5-2.5 (3) MG/3ML Soln Commonly known as:  DUONEB Take 3 mLs by nebulization every 8 (eight) hours as needed.   ipratropium-albuterol 0.5-2.5 (3) MG/3ML Soln Commonly known as:  DUONEB Take 3 mLs by nebulization 2 (two) times daily.   lidocaine 5 % Commonly known as:  LIDODERM Place 1 patch onto the skin daily. Apply to left neck for pain Remove & Discard patch within 12 hours or as directed by MD   losartan 25 MG tablet Commonly known as:  COZAAR Take 1 tablet (25 mg total) by mouth daily. What changed:    medication strength  how much to take  when to take this   Metoprolol Tartrate 75 MG Tabs Take 75 mg by mouth 2 (two) times daily. Hold med if SBP <110 OR HR < 60 b/min   mineral oil-hydrophilic petrolatum ointment Apply 1 application daily as needed topically for dry skin (to the left upper arm).   OXYGEN Inhale 1.5 L/min into the lungs.   predniSONE 5 MG tablet Commonly known as:  DELTASONE Take 5 mg by mouth daily with breakfast.   saccharomyces boulardii 250 MG capsule Commonly known as:  FLORASTOR Take 250 mg by mouth 2 (two) times daily.   THERAVIM-M PO Take 1 tablet by mouth daily.   traZODone 50 MG tablet Commonly known as:  DESYREL Take 50 mg by mouth daily.   trolamine salicylate 10 % cream Commonly known as:  ASPERCREME Apply 1 application topically 3 (three) times daily. Apply to right knee   vancomycin 50 mg/mL  oral solution Commonly known as:  VANCOCIN Take 2.5 mLs (125 mg total) by mouth every 6 (six) hours for 7 days.   zinc oxide 20 % ointment Apply 1 application topically as needed for irritation. Apply to buttocks as  needed       Allergies  Allergen Reactions  . Adhesive [Tape]     unknown  . Aspirin     unknown  . Augmentin [Amoxicillin-Pot Clavulanate]   . Codeine     unknown  . Diclofenac   . Enalapril Cough  . Hydrocodone     unknown  . Pantoprazole Sodium     unknown  . Voltaren [Diclofenac Sodium]     Consultations:  none   Procedures/Studies: Ct Abdomen Pelvis Wo Contrast  Result Date: 02/17/2018 CLINICAL DATA:  Abdominal distention. Lower extremity swelling. Shortness of breath. Back pain. EXAM: CT ABDOMEN AND PELVIS WITHOUT CONTRAST TECHNIQUE: Multidetector CT imaging of the abdomen and pelvis was performed following the standard protocol without IV contrast. COMPARISON:  CT scan of the pelvis dated 09/13/2008 and CT scan of the chest dated 10/17/2009 FINDINGS: Lower chest: With slight atelectasis or scarring at the left  lung base posteriorly. Aortic atherosclerosis. Heart size is normal. No pericardial effusion. Tiny hiatal hernia. Hepatobiliary: 7 mm low-density lesion in the left lobe of the liver adjacent to the falciform ligament, minimally more prominent than on the prior study. Multiple stones in the nondistended gallbladder. The common bile duct measures 12 mm in diameter. There is dense material in the region of the distal common bile duct and ampulla which could represent stones in the distal common bile duct. Patient's bilirubin is not elevated. Pancreas: Diffuse atrophy of the pancreas. No pancreatic ductal dilatation. Spleen: Normal in size without focal abnormality. Adrenals/Urinary Tract: There is a 14 mm lesion which appears to be associated with the right renal artery. 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 Kidneys are normal.  Adrenal glands are normal.  Bladder is normal. Stomach/Bowel: Evidence of previous gastric bypass. There are a few diverticula in the colon. Small bowel and appendix appear normal. Vascular/Lymphatic: Extensive aortic atherosclerosis. No  enlarged abdominal or pelvic lymph nodes. Reproductive: Calcified fibroids in the small uterus. 3.1 x 4.5 cm low-density lesion in the left ovary, increased from 2.2 cm on the prior study. Right ovary appears normal. Other: Tiny umbilical hernia containing only fat.  No ascites. Musculoskeletal: There is a mild benign-appearing compression fracture of the superior endplate of L2, age indeterminate. IMPRESSION: 1. Cholelithiasis with dilatation of the common bile duct with possible stones in the distal common bile duct. 2. 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 3. 4.5 cm low-density lesion in the left ovary, slightly increased in size since the prior CT scan of 2010. This is indeterminate. 4. Benign-appearing mild compression fracture of the superior endplate of L2, age indeterminate. 5.  Aortic Atherosclerosis (ICD10-I70.0). Electronically Signed   By: Lorriane Shire M.D.   On: 02/17/2018 16:58   Dg Chest Port 1 View  Result Date: 02/19/2018 CLINICAL DATA:  Shortness of breath EXAM: PORTABLE CHEST 1 VIEW COMPARISON:  02/17/2018 FINDINGS: Mild hyperinflation/COPD. Heart is borderline in size. Mild interstitial prominence without confluent opacity or effusion. IMPRESSION: Mild hyperinflation, COPD.  No active disease. Electronically Signed   By: Rolm Baptise M.D.   On: 02/19/2018 10:30   Dg Chest Port 1 View  Result Date: 02/17/2018 CLINICAL DATA:  Shortness of breath, distended abdomen, lower extremity swelling, history bronchiectasis, gastric ulcer, COPD, coronary artery disease, hypertension, type II diabetes mellitus, former smoker EXAM: PORTABLE CHEST 1 VIEW COMPARISON:  Portable exam 1358 hours compared to 11/24/2014 FINDINGS: Borderline enlargement of cardiac silhouette. Mediastinal contours and pulmonary vascularity normal. Atherosclerotic calcification aorta. Minimal bronchitic changes and bibasilar atelectasis. Lungs otherwise clear. No definite pleural effusion or pneumothorax. Bones  demineralized with advanced LEFT glenohumeral degenerative changes. IMPRESSION: Bronchitic changes with bibasilar atelectasis. Electronically Signed   By: Lavonia Dana M.D.   On: 02/17/2018 14:50       Subjective: agitated overnight; intermittently confused. Afebrile. Wbc normal. No diarrhea for 48 hours.    Discharge Exam: Vitals:   02/21/18 0500 02/21/18 0708  BP:  (!) 163/74  Pulse:  80  Resp: 19   Temp:  98.5 F (36.9 C)  SpO2: 97% 96%   Vitals:   02/20/18 2355 02/21/18 0136 02/21/18 0500 02/21/18 0708  BP:    (!) 163/74  Pulse:    80  Resp: (!) 22  19   Temp:    98.5 F (36.9 C)  TempSrc:      SpO2:  96% 97% 96%  Weight:      Height:  General exam: Appears calm and comfortable but confused (not delirium though) Respiratory system: not in respiratory distress, + scattered rhonchi in bilateral upper bases, intermittent wheezing. Cardiovascular system: Regular rate and rhythm, no murmurs Gastrointestinal system: Soft, mildly distended, non- tender to palpation throughout, no rebound or guarding, positive bowel sounds. Central nervous system: Alert and oriented.  Grossly intact, moving all extremities Extremities: Trace lower extremity edema Skin: Bilateral lower extremity contusions over leg Psychiatry: confused but no agitation    The results of significant diagnostics from this hospitalization (including imaging, microbiology, ancillary and laboratory) are listed below for reference.     Microbiology: Recent Results (from the past 240 hour(s))  Blood Culture (routine x 2)     Status: None (Preliminary result)   Collection Time: 02/17/18  2:19 PM  Result Value Ref Range Status   Specimen Description   Final    BLOOD LEFT FOREARM Performed at [facility], [place] [address] [place], [place] [address]    Special Requests   Final    BOTTLES DRAWN AEROBIC AND ANAEROBIC Blood Culture adequate volume Performed at [place] [address], [place], [place] [address]    Culture   Final    NO GROWTH 3 DAYS Performed at [facility], [place] [address] [place], [place] [address]    Report Status PENDING  Incomplete  Gastrointestinal Panel by PCR , Stool     Status: None   Collection Time: 02/17/18  2:19 PM  Result Value Ref Range Status   Campylobacter species NOT DETECTED NOT DETECTED Final   Plesimonas shigelloides NOT DETECTED NOT DETECTED Final   Salmonella species NOT DETECTED NOT DETECTED Final   Yersinia enterocolitica NOT DETECTED NOT DETECTED Final   Vibrio species NOT DETECTED NOT DETECTED Final   Vibrio cholerae NOT DETECTED NOT DETECTED Final   Enteroaggregative E coli (EAEC) NOT DETECTED NOT DETECTED Final   Enteropathogenic E coli (EPEC) NOT DETECTED NOT DETECTED Final   Enterotoxigenic E coli (ETEC) NOT DETECTED NOT DETECTED Final   Shiga like toxin producing E coli (STEC) NOT DETECTED NOT DETECTED Final   Shigella/Enteroinvasive E coli (EIEC) NOT DETECTED NOT DETECTED Final   Cryptosporidium NOT DETECTED NOT DETECTED Final   Cyclospora cayetanensis NOT DETECTED NOT DETECTED Final   Entamoeba histolytica NOT DETECTED NOT DETECTED Final   Giardia lamblia NOT DETECTED NOT DETECTED Final   Adenovirus F40/41 NOT DETECTED NOT DETECTED Final   Astrovirus NOT DETECTED NOT DETECTED Final   Norovirus GI/GII NOT DETECTED NOT DETECTED Final   Rotavirus A NOT DETECTED NOT DETECTED Final   Sapovirus (I, II, IV, and V) NOT DETECTED NOT DETECTED Final    Comment: Performed at [facility], [place] [place], [place] [address]  Blood Culture (routine x 2)     Status: None (Preliminary result)   Collection Time: 02/17/18  2:57 PM  Result Value Ref Range Status   Specimen Description   Final    BLOOD RIGHT ANTECUBITAL Performed at [facility], [place] [address], [place], [place] [address]    Special Requests   Final    BOTTLES DRAWN AEROBIC AND ANAEROBIC  Blood Culture adequate volume Performed at [place] [address] [place], [place] [address]    Culture   Final    NO GROWTH 3 DAYS Performed at [facility], [place] [address] [place], [place] [address]    Report Status PENDING  Incomplete  Urine  culture     Status: Abnormal   Collection Time: 02/17/18  4:31 PM  Result Value Ref Range Status   Specimen Description   Final    URINE, CLEAN CATCH Performed at [facility], [place] [address], [place], [place] [address]    Special Requests   Final    NONE Performed at [facility], [place] [address], [place], [place] [address]    Culture 40,000 COLONIES/mL YEAST (A)  Final   Report Status 02/19/2018 FINAL  Final  C difficile quick scan w PCR reflex     Status: Abnormal   Collection Time: 02/18/18  9:44 AM  Result Value Ref Range Status   C Diff antigen POSITIVE (A) NEGATIVE Final   C Diff toxin POSITIVE (A) NEGATIVE Final   C Diff interpretation Toxin producing C. difficile detected.  Final    Comment: CRITICAL RESULT CALLED TO, READ BACK BY AND VERIFIED WITH: SIMPSON, C RN @1138  ON 1.16.2020 BY [facility] Performed at [facility], [place] [address] [place], [place] [address]   MRSA PCR Screening     Status: None   Collection Time: 02/18/18  6:15 PM  Result Value Ref Range Status   MRSA by PCR NEGATIVE NEGATIVE Final    Comment:        The GeneXpert MRSA Assay (FDA approved for NASAL specimens only), is one component of a comprehensive MRSA colonization surveillance program. It is not intended to diagnose MRSA infection nor to guide or monitor treatment for MRSA infections. Performed at [facility], [place] [address] [place], [place] [address]      Labs: BNP (last 3 results) Recent Labs    02/20/18 0855  BNP 182.9*   Basic Metabolic Panel: Recent Labs  Lab 02/17/18 1419 02/18/18 0420 02/19/18 0709 02/20/18 0525  NA  139 145 146* 147*  K 5.7* 4.8 4.6 4.3  CL 101 107 112* 110  CO2 26 27 25 27   GLUCOSE 179* 108* 161* 66*  BUN 84* 73* 48* 42*  CREATININE 1.70* 1.41* 1.25* 1.11*  CALCIUM 9.2 9.1 8.7* 9.4   Liver Function Tests: Recent Labs  Lab 02/17/18 1419 02/18/18 0420 02/19/18 0709  AST 22 20 24   ALT 14 14 13   ALKPHOS 124 98 84  BILITOT 0.7 0.8 0.6  PROT 6.7 6.5 5.9*  ALBUMIN 3.5 3.3* 2.9*   No results for input(s): LIPASE, AMYLASE in the last 168 hours. No results for input(s): AMMONIA in the last 168 hours. CBC: Recent Labs  Lab 02/17/18 1419 02/18/18 0420 02/19/18 0709 02/20/18 0525  WBC 14.7* 12.3* 11.8* 11.4*  NEUTROABS 12.3*  --   --   --   HGB 10.6* 10.8* 9.9* 10.0*  HCT 34.3* 35.9* 33.2* 33.5*  MCV 103.9* 105.6* 107.1* 107.4*  PLT 241 220 221 209   Cardiac Enzymes: No results for input(s): CKTOTAL, CKMB, CKMBINDEX, TROPONINI in the last 168 hours. BNP: Invalid input(s): POCBNP CBG: Recent Labs  Lab 02/20/18 2028 02/20/18 2107 02/20/18 2337 02/21/18 0629 02/21/18 0804  GLUCAP 63* 92 115* 85 83   D-Dimer No results for input(s): DDIMER in the last 72 hours. Hgb A1c No results for input(s): HGBA1C in the last 72 hours. Lipid Profile No results for input(s): CHOL, HDL, LDLCALC, TRIG, CHOLHDL, LDLDIRECT in the last 72 hours. Thyroid function studies No results for input(s): TSH, T4TOTAL, T3FREE, THYROIDAB in the last 72 hours.  Invalid input(s): FREET3 Anemia work up No results for input(s): VITAMINB12, FOLATE,  FERRITIN, TIBC, IRON, RETICCTPCT in the last 72 hours. Urinalysis    Component Value Date/Time   COLORURINE YELLOW 02/17/2018 1631   APPEARANCEUR HAZY (A) 02/17/2018 1631   LABSPEC 1.017 02/17/2018 1631   PHURINE 5.0 02/17/2018 1631   GLUCOSEU NEGATIVE 02/17/2018 1631   HGBUR NEGATIVE 02/17/2018 1631   BILIRUBINUR NEGATIVE 02/17/2018 1631   KETONESUR NEGATIVE 02/17/2018 1631   PROTEINUR NEGATIVE 02/17/2018 1631   UROBILINOGEN 0.2 11/24/2014  1210   NITRITE NEGATIVE 02/17/2018 1631   LEUKOCYTESUR LARGE (A) 02/17/2018 1631   Sepsis Labs Invalid input(s): PROCALCITONIN,  WBC,  LACTICIDVEN Microbiology Recent Results (from the past 240 hour(s))  Blood Culture (routine x 2)     Status: None (Preliminary result)   Collection Time: 02/17/18  2:19 PM  Result Value Ref Range Status   Specimen Description   Final    BLOOD LEFT FOREARM Performed at [facility], [place] [address] [place], [place] [address]    Special Requests   Final    BOTTLES DRAWN AEROBIC AND ANAEROBIC Blood Culture adequate volume Performed at [place] [address], [place], [place] [address]    Culture   Final    NO GROWTH 3 DAYS Performed at [facility], [place] [address], [place], [place] [address]    Report Status PENDING  Incomplete  Gastrointestinal Panel by PCR , Stool     Status: None   Collection Time: 02/17/18  2:19 PM  Result Value Ref Range Status   Campylobacter species NOT DETECTED NOT DETECTED Final   Plesimonas shigelloides NOT DETECTED NOT DETECTED Final   Salmonella species NOT DETECTED NOT DETECTED Final   Yersinia enterocolitica NOT DETECTED NOT DETECTED Final   Vibrio species NOT DETECTED NOT DETECTED Final   Vibrio cholerae NOT DETECTED NOT DETECTED Final   Enteroaggregative E coli (EAEC) NOT DETECTED NOT DETECTED Final   Enteropathogenic E coli (EPEC) NOT DETECTED NOT DETECTED Final   Enterotoxigenic E coli (ETEC) NOT DETECTED NOT DETECTED Final   Shiga like toxin producing E coli (STEC) NOT DETECTED NOT DETECTED Final   Shigella/Enteroinvasive E coli (EIEC) NOT DETECTED NOT DETECTED Final   Cryptosporidium NOT DETECTED NOT DETECTED Final   Cyclospora cayetanensis NOT DETECTED NOT DETECTED Final   Entamoeba histolytica NOT DETECTED NOT DETECTED Final   Giardia lamblia NOT DETECTED NOT DETECTED Final   Adenovirus F40/41 NOT DETECTED NOT DETECTED Final   Astrovirus NOT DETECTED NOT  DETECTED Final   Norovirus GI/GII NOT DETECTED NOT DETECTED Final   Rotavirus A NOT DETECTED NOT DETECTED Final   Sapovirus (I, II, IV, and V) NOT DETECTED NOT DETECTED Final    Comment: Performed at [facility], [place] [place], [place] [address]  Blood Culture (routine x 2)     Status: None (Preliminary result)   Collection Time: 02/17/18  2:57 PM  Result Value Ref Range Status   Specimen Description   Final    BLOOD RIGHT ANTECUBITAL Performed at [facility], [place] [address] [place], [place] [address]    Special Requests   Final    BOTTLES DRAWN AEROBIC AND ANAEROBIC Blood Culture adequate volume Performed at [place] [address] [place], [place] [address]    Culture   Final    NO GROWTH 3 DAYS Performed at [facility], [place] [address], [place],  [address]    Report Status PENDING  Incomplete  Urine culture  Status: Abnormal   Collection Time: 02/17/18  4:31 PM  Result Value Ref Range Status   Specimen Description   Final    URINE, CLEAN CATCH Performed at [facility], [place] [address], [place], [place] [address]    Special Requests   Final    NONE Performed at [facility], [place] [address], [place], [place] [address]    Culture 40,000 COLONIES/mL YEAST (A)  Final   Report Status 02/19/2018 FINAL  Final  C difficile quick scan w PCR reflex     Status: Abnormal   Collection Time: 02/18/18  9:44 AM  Result Value Ref Range Status   C Diff antigen POSITIVE (A) NEGATIVE Final   C Diff toxin POSITIVE (A) NEGATIVE Final   C Diff interpretation Toxin producing C. difficile detected.  Final    Comment: CRITICAL RESULT CALLED TO, READ BACK BY AND VERIFIED WITH: SIMPSON, C RN @1138  ON 1.16.2020 BY [facility] Performed at [facility], [place] [address], [place], [place] [address]   MRSA PCR Screening     Status: None   Collection Time: 02/18/18  6:15  PM  Result Value Ref Range Status   MRSA by PCR NEGATIVE NEGATIVE Final    Comment:        The GeneXpert MRSA Assay (FDA approved for NASAL specimens only), is one component of a comprehensive MRSA colonization surveillance program. It is not intended to diagnose MRSA infection nor to guide or monitor treatment for MRSA infections. Performed at [facility], [place] [address] [place], [place] [address]      Time coordinating discharge: 39 minutes  SIGNED:   Paticia Stack, MD  [facility] 02/21/2018, 8:30 AM Pager   If 7PM-7AM, please contact night-coverage [email or web address] Password [email or web address]

## 2018-02-21 NOTE — NC FL2 (Signed)
[place] LEVEL OF CARE SCREENING TOOL     IDENTIFICATION  Patient Name: [name] Birthdate: 25-Jun-1917 Sex: female Admission Date (Current Location): 02/17/2018  [facility] and [place] Number:  Herbalist and Address:  [facility],  [address] [place], [place]      Provider Number: [id number]  Attending Physician Name and Address:  Paticia Stack, MD  Relative Name and Phone Number:  Heavenlee Maiorana: [phone]    Current Level of Care: Hospital Recommended Level of Care: [place] Prior Approval Number:    Date Approved/Denied:   PASRR Number:    Discharge Plan: SNF    Current Diagnoses: Patient Active Problem List   Diagnosis Date Noted  . C. difficile colitis 02/20/2018  . Frequent falls 02/20/2018  . UTI due to extended-spectrum beta lactamase (ESBL) producing Escherichia coli 02/20/2018  . Hypernatremia 02/20/2018  . Anemia, chronic disease 02/20/2018  . Fall 02/16/2018  . Altered mental state 02/16/2018  . Traumatic hematoma of left lower leg 02/14/2018  . Type 2 diabetes with decreased circulation ([place]) 08/05/2017  . Chronic respiratory failure with hypoxia ([place]) 12/22/2016  . Hypertensive heart and kidney disease with chronic combined systolic and diastolic congestive heart failure and stage 3 chronic kidney disease ([place]) 09/28/2016  . Current chronic use of systemic steroids 08/11/2016  . OAB (overactive bladder) 08/11/2016  . CKD (chronic kidney disease) stage 3, GFR 30-59 ml/min (HCC) 07/11/2016  . Cutaneous horn 04/08/2016  . Dizziness 03/11/2016  . Occipital neuralgia of left side 08/28/2015  . Venous stasis dermatitis of both lower extremities 08/28/2015  . Tinnitus 08/24/2015  . Chronic combined systolic and diastolic congestive heart failure ([place]) 12/08/2014  . Anemia of chronic disease 12/08/2014  . Tachycardia 12/06/2014  . Constipation 12/05/2014  . Hyponatremia 11/24/2014  .  Weakness generalized 05/22/2014  . Insomnia 11/21/2013  . Nodule of neck 11/07/2013  . Nocturia 09/13/2013  . Hyperlipidemia 07/19/2013  . Urine, incontinence, stress female 07/19/2013  . Controlled type 2 diabetes mellitus with stage 3 chronic kidney disease, with long-term current use of insulin ([place]) 05/24/2013  . Edema 05/24/2013  . Esophageal reflux   . Pain in joint, shoulder region 07/27/2012  . Arthritis   . Other malaise and fatigue   . Mucopurulent chronic bronchitis ([place])   . Pain in joint, lower leg   . COPD (chronic obstructive pulmonary disease) ([place]) 02/06/2012  . DOE (dyspnea on exertion) 04/10/2011  . Bronchiectasis without acute exacerbation ([place]) 04/10/2011  . Pulmonary nodules 04/10/2011    Orientation RESPIRATION BLADDER Height & Weight     Self  O2(2L) External catheter Weight: 213 lb (96.6 kg) Height:  5\' 7"  (170.2 cm)  BEHAVIORAL SYMPTOMS/MOOD NEUROLOGICAL BOWEL NUTRITION STATUS      Incontinent Diet(Heart Healthy)  AMBULATORY STATUS COMMUNICATION OF NEEDS Skin   Extensive Assist Verbally Normal                       Personal Care Assistance Level of Assistance  Bathing, Feeding, Dressing Bathing Assistance: Maximum assistance Feeding assistance: Limited assistance Dressing Assistance: Maximum assistance     Functional Limitations Info  Speech, Hearing, Sight Sight Info: Adequate Hearing Info: Adequate Speech Info: Adequate    SPECIAL CARE FACTORS FREQUENCY                       Contractures Contractures Info: Not present    Additional Factors Info  Code Status, Allergies, Isolation Precautions Code Status Info: DNR Allergies Info: ADHESIVE TAPE, ASPIRIN, AUGMENTIN AMOXICILLIN-POT CLAVULANATE, CODEINE, DICLOFENAC, ENALAPRIL, HYDROCODONE, PANTOPRAZOLE SODIUM, VOLTAREN DICLOFENAC SODIUM      Isolation Precautions Info: Enteric precautions     Current Medications (02/21/2018):  This is the current hospital active medication  list Current Facility-Administered Medications  Medication Dose Route Frequency Provider Last Rate Last Dose  . acetaminophen (TYLENOL) tablet 650 mg  650 mg Oral Q6H PRN Purohit, Shrey C, MD   650 mg at 02/20/18 2130   Or  . acetaminophen (TYLENOL) suppository 650 mg  650 mg Rectal Q6H PRN Purohit, Shrey C, MD      . dextrose 50 % solution 25 mL  25 mL Intravenous Once Blount, Xenia T, NP      . heparin injection 5,000 Units  5,000 Units Subcutaneous Q8H Purohit, Konrad Dolores, MD   5,000 Units at 02/21/18 0522  . insulin aspart (novoLOG) injection 0-5 Units  0-5 Units Subcutaneous QHS Purohit, Shrey C, MD      . insulin aspart (novoLOG) injection 0-9 Units  0-9 Units Subcutaneous TID WC Purohit, Shrey C, MD   1 Units at 02/19/18 1345  . insulin glargine (LANTUS) injection 26 Units  26 Units Subcutaneous QHS Cristy Folks, MD   26 Units at 02/19/18 2233  . ipratropium-albuterol (DUONEB) 0.5-2.5 (3) MG/3ML nebulizer solution 3 mL  3 mL Nebulization Q6H Paticia Stack, MD   3 mL at 02/21/18 0829  . ipratropium-albuterol (DUONEB) 0.5-2.5 (3) MG/3ML nebulizer solution 3 mL  3 mL Nebulization Q4H PRN Paticia Stack, MD      . lactobacillus acidophilus & bulgar (LACTINEX) chewable tablet 2 tablet  2 tablet Oral TID WC Lenis Noon, [facility]   2 tablet at 02/20/18 1327  . lidocaine (LIDODERM) 5 % 1 patch  1 patch Transdermal Q24H Purohit, Konrad Dolores, MD   1 patch at 02/19/18 2339  . liver oil-zinc oxide (DESITIN) 40 % ointment   Topical QID Purohit, Shrey C, MD      . liver oil-zinc oxide (DESITIN) 40 % ointment   Topical BID PRN Purohit, Shrey C, MD      . metoprolol tartrate (LOPRESSOR) tablet 75 mg  75 mg Oral BID Purohit, Shrey C, MD   75 mg at 02/20/18 2131  . mometasone-formoterol (DULERA) 100-5 MCG/ACT inhaler 2 puff  2 puff Inhalation BID Purohit, Konrad Dolores, MD   2 puff at 02/20/18 2033  . ondansetron (ZOFRAN) tablet 4 mg  4 mg Oral Q6H PRN Purohit, Shrey C, MD       Or  . ondansetron (ZOFRAN) injection 4  mg  4 mg Intravenous Q6H PRN Purohit, Shrey C, MD      . predniSONE (DELTASONE) tablet 5 mg  5 mg Oral Q breakfast Purohit, Shrey C, MD   5 mg at 02/20/18 0814  . saccharomyces boulardii (FLORASTOR) capsule 250 mg  250 mg Oral BID Purohit, Shrey C, MD   250 mg at 02/20/18 2131  . senna-docusate (Senokot-S) tablet 1 tablet  1 tablet Oral QHS PRN Purohit, Shrey C, MD      . traZODone (DESYREL) tablet 50 mg  50 mg Oral Daily Purohit, Shrey C, MD   50 mg at 02/20/18 0948  . vancomycin (VANCOCIN) 50 mg/mL oral solution 125 mg  125 mg Oral Q6H Purohit, Shrey C, MD   125 mg at 02/21/18 3009     Discharge Medications: Please see discharge summary for a list  of discharge medications.  Relevant Imaging Results:  Relevant Lab Results:   Additional Information SSN: [id number]  Pricilla Holm, [place]

## 2018-02-21 NOTE — Progress Notes (Addendum)
Patient discharging to [facility]. Required docs faxed to facility and ability to return confirmed. Called son Mortimer Fries and made aware of discharge plan. [facility] will be used for transport.  RN call report: [phone].   Pricilla Holm, MSW, [place] [facility] [phone]

## 2018-02-21 NOTE — Evaluation (Signed)
Occupational Therapy Evaluation Patient Details Name: [name] MRN: [id number] DOB: [birth year] Today's Date: 02/21/2018    History of Present Illness PATIENT WAS ADMITTED FOR CDIFF AND WAS BEING TREATED FOR A UTI. PATIENT WITH HX OF DM, COPD, HTN,   Clinical Impression   PATIENT REQUIRED MAX ENCOURAGEMENT TO PARTICIPATE IN THERAPY. PATIENT WAS RESISTANT TO ROM OF B UE. PATIENT WAS RESISTANT TO BE REPOSITIONED IN BED. PATIENT REQUIRED MAX CUES FOR PATIENT TO STAY AWAKE FOR GROOMING AND SELF FEEDING. PATIENT WAS DEP FOR GROOMING AND SELF FEEDING. PATIENT WOULD NOT HOLD BISCUIT, SILVERWARE, OR CUP. PER NURSING SHE DID FEED HERSELF YESTERDAY. NO FURTHER ACUTE OF RECOMMENDED. PATIENT IS SUPPOSED TO D/C BACK TO SNF. FURTHER OT CAN BE ADDRESSED THERE.     Follow Up Recommendations  SNF    Equipment Recommendations       Recommendations for Other Services       Precautions / Restrictions Precautions Precautions: Fall Restrictions Weight Bearing Restrictions: No      Mobility Bed Mobility Overal bed mobility: (2 PERSON ASSIST TO MOVE PATIENT TO POSTION HER FOR SELF FEED)             General bed mobility comments: WAS RESISTANT TO BEING REPOSTIONED.   Transfers                      Balance                                           ADL either performed or assessed with clinical judgement   ADL Overall ADL's : Needs assistance/impaired Eating/Feeding: Total assistance Eating/Feeding Details (indicate cue type and reason): PER NURSING SHE WAS ABLE TO FEED HERSELF YESTERDAY.  Grooming: Wash/dry hands;Wash/dry face;Total assistance                                 General ADL Comments: PATIENT REQUIRED MAX CUES TO EAT AND WOULD NOT ATTEMPT TO FEED HERELF.      Vision         Perception     Praxis      Pertinent Vitals/Pain Pain Assessment: Faces Faces Pain Scale: Hurts little more Pain Location: (L SHLD)     Hand  Dominance     Extremity/Trunk Assessment Upper Extremity Assessment Upper Extremity Assessment: Difficult to assess due to impaired cognition(PATIENT WAS RESISTIVE TO ROM.)           Communication     Cognition Arousal/Alertness: Lethargic Behavior During Therapy: Agitated Overall Cognitive Status: No family/caregiver present to determine baseline cognitive functioning                                 General Comments: PATIENT WAS NOT CONSISTANLY FOLLOWING DIRECTIONS. SHE IS VERY HOH AND HER HEARING AID AND BATTERIES NEED TO BE CHANGED PER NURSING.    General Comments       Exercises     Shoulder Instructions      Home Living                                          Prior Functioning/Environment  OT Problem List:        OT Treatment/Interventions:      OT Goals(Current goals can be found in the care plan section) Acute Rehab OT Goals Patient Stated Goal: GO TO HER ROOM  OT Frequency:     Barriers to D/C:            Co-evaluation              AM-PAC OT "6 Clicks" Daily Activity     Outcome Measure Help from another person eating meals?: Total Help from another person taking care of personal grooming?: Total Help from another person toileting, which includes using toliet, bedpan, or urinal?: Total Help from another person bathing (including washing, rinsing, drying)?: Total Help from another person to put on and taking off regular upper body clothing?: Total Help from another person to put on and taking off regular lower body clothing?: Total 6 Click Score: 6   End of Session Nurse Communication: (OK THERAPY)  Activity Tolerance:   Patient left: in bed;with call bell/phone within reach;with bed alarm set                   Time: 1583-0940 OT Time Calculation (min): 45 min Charges:  OT General Charges $OT Visit: 1 Visit OT Evaluation $OT Eval Low Complexity: 1 Low OT Treatments $Self  Care/Home Management : 7-68 mins  6 CLICKS.   Alyze Lauf 02/21/2018, 9:51 AM

## 2018-02-22 LAB — CULTURE, BLOOD (ROUTINE X 2)
Culture: NO GROWTH
Culture: NO GROWTH
SPECIAL REQUESTS: ADEQUATE
Special Requests: ADEQUATE

## 2018-02-24 ENCOUNTER — Encounter: Payer: Self-pay | Admitting: Internal Medicine

## 2018-02-24 NOTE — Progress Notes (Signed)
Opened in error

## 2018-02-26 ENCOUNTER — Encounter: Payer: Self-pay | Admitting: Internal Medicine

## 2018-02-26 ENCOUNTER — Non-Acute Institutional Stay (SKILLED_NURSING_FACILITY): Payer: Medicare Other | Admitting: Internal Medicine

## 2018-02-26 DIAGNOSIS — E1122 Type 2 diabetes mellitus with diabetic chronic kidney disease: Secondary | ICD-10-CM

## 2018-02-26 DIAGNOSIS — N183 Chronic kidney disease, stage 3 unspecified: Secondary | ICD-10-CM

## 2018-02-26 DIAGNOSIS — I5042 Chronic combined systolic (congestive) and diastolic (congestive) heart failure: Secondary | ICD-10-CM | POA: Diagnosis not present

## 2018-02-26 DIAGNOSIS — A0472 Enterocolitis due to Clostridium difficile, not specified as recurrent: Secondary | ICD-10-CM | POA: Diagnosis not present

## 2018-02-26 DIAGNOSIS — R531 Weakness: Secondary | ICD-10-CM

## 2018-02-26 DIAGNOSIS — Z794 Long term (current) use of insulin: Secondary | ICD-10-CM

## 2018-02-26 DIAGNOSIS — J449 Chronic obstructive pulmonary disease, unspecified: Secondary | ICD-10-CM | POA: Diagnosis not present

## 2018-02-26 DIAGNOSIS — D638 Anemia in other chronic diseases classified elsewhere: Secondary | ICD-10-CM

## 2018-02-26 NOTE — Progress Notes (Signed)
Provider:   Location:    [facility]ing Home Room Number: [id number] Place of Service:  SNF (31)  PCP: Virgie Dad, MD Patient Care Team: Virgie Dad, MD as PCP - General (Internal Medicine) Clent Jacks, MD (Ophthalmology) Adrian Prows, MD as Attending Physician (Cardiology) Murdock, [facility] [name], MD as Consulting Physician (Orthopedic Surgery) Clance, Armando Reichert, MD as Consulting Physician (Pulmonary Disease) Mast, Man X, NP as Nurse Practitioner (Internal Medicine)  Extended Emergency Contact Information Primary Emergency Contact: Popovich,[name] Address: [address]          [place], [place] [address] [place] Phone: [phone] Relation: Son Secondary Emergency Contact: [name] [place] Mobile Phone: [phone] Relation: Daughter  Code Status: DNR Goals of Care: Advanced Directive information Advanced Directives 02/26/2018  Does Patient Have a Medical Advance Directive? Yes  Type of Paramedic of [place];Out of facility DNR (pink MOST or yellow form)  Does patient want to make changes to medical advance directive? No - Patient declined  Copy of [place] in Chart? Yes - validated most recent copy scanned in chart (See row information)  Pre-existing out of facility DNR order (yellow form or pink MOST form) Yellow form placed in chart (order not valid for inpatient use)      Chief Complaint  Patient presents with  . Readmit To SNF    READMIT TO FACILITY    HPI: Patient is a 85 y.o. female seen today for admission to SNF for therapy and [place] Patient was admitted to the hospital from 01/15-01/19 for C Diff Colitis Patient has h/o COPD on Oxygen, Diabetes mellitus , CHF , Anemia, Hypertension. She [facility]. She was send to the ED for Fever and Leucocytosis with Possible sepsis. Patient was treated for UTI ESBL with IV Ertapenem.  But in the Facility she was getting weak and Lethargic. In the Hospital she was found to have C Diff Colitis. She was also in Acute Renal insufficiency. Her Lasix and Aldactone was held. She was started on PO Vancomycin. Her Diarrhea resolved and Her AKI resolved with Hydration Patient is now in facility. In Isolation and on oral Vanco. D/W the Nurse and they don,t Report any diarrhea. They do say patient is not doing well. Continues to be weak and Refuses to mostly get out of bed. Patient was c/o SOB and Cough. She has gained 5 lbs in last 2 days. She denies any Abdominal Pain or diarrhea.Appetite still poor. No Fever   Past Medical History:  Diagnosis Date  . Abnormality of gait   . Anemia, unspecified   . Arthritis   . Bronchiectasis without acute exacerbation ([place]) 04/10/2011   CT chest 2011   . Cataract 1990   Dr. Katy Fitch  . Cholelithiases   . Closed fracture of unspecified part of ramus of mandible   . COPD (chronic obstructive pulmonary disease) ([place])   . Coronary atherosclerosis of unspecified type of vessel, native or graft   . Disturbance of skin sensation   . DOE (dyspnea on exertion) 04/10/2011   PFTs 2013:  FEV1 1.04 (78%), ratio 64, couldn't do maneuver for lung volumes or dlco   . Edema   . Esophageal reflux   . Fall from other slipping, tripping, or stumbling   . Gastric ulcer with hemorrhage 2008  . Hypertension   . Hypoxemia   . Insomnia, unspecified   .  Mucopurulent chronic bronchitis ([place])   . Nodule of neck 11/07/2013   Left neck posteriorly   . Nontoxic uninodular goiter   . Osteoarthrosis, unspecified whether generalized or localized, unspecified site   . Other and unspecified hyperlipidemia   . Other malaise and fatigue   . Pain in joint, lower leg 2010   right knee  . Pain in joint, shoulder region 2013   left  . Palpitations   . Pneumonia, organism unspecified(486)   . Regional enteritis of unspecified site   . Rosacea   . Sciatica   . Squamous cell  carcinoma of skin of lower limb, including hip 07/27/2012   Nonhealing lesion of the left mid calf medially   . Tachycardia 12/06/2014  . Tension headache   . Tension headache   . Type II or unspecified type diabetes mellitus without mention of complication, not stated as uncontrolled   . Unspecified venous (peripheral) insufficiency   . Urine, incontinence, stress female 07/19/2013   Past Surgical History:  Procedure Laterality Date  . EYE SURGERY Bilateral 1990   Dr. Katy Fitch  . GASTROJEJUNOSTOMY  05/2004   secondary to ulcer  . JOINT REPLACEMENT Bilateral 1993-1998   total knee replacement  . MOLE REMOVAL  05/11/14   left hand and left side of [facility]  . OTHER SURGICAL HISTORY  2008   Ulcer surgery   . SMALL INTESTINE SURGERY    . TOTAL KNEE ARTHROPLASTY Bilateral 1993, 1998   knees    reports that she quit smoking about 45 years ago. Her smoking use included cigarettes. She has a 3.00 pack-year smoking history. She has never used smokeless tobacco. She reports that she does not drink alcohol or use drugs. Social History   Socioeconomic History  . Marital status: Widowed    Spouse name: Not on file  . Number of children: 2  . Years of education: Not on file  . Highest education level: Not on file  Occupational History  . Occupation: retired Agricultural engineer.   Social Needs  . Financial resource strain: Not on file  . Food insecurity:    Worry: Not on file    Inability: Not on file  . Transportation needs:    Medical: Not on file    Non-medical: Not on file  Tobacco Use  . Smoking status: Former Smoker    Packs/day: 0.30    Years: 10.00    Pack years: 3.00    Types: Cigarettes    Last attempt to quit: 02/03/1973    Years since quitting: 45.0  . Smokeless tobacco: Never Used  . Tobacco comment: former casual smoker, lots of second hand smoke exposure  Substance and Sexual Activity  . Alcohol use: No  . Drug use: No  . Sexual activity: Never  Lifestyle  .  Physical activity:    Days per week: Not on file    Minutes per session: Not on file  . Stress: Not on file  Relationships  . Social connections:    Talks on phone: Not on file    Gets together: Not on file    Attends religious service: Not on file    Active member of club or organization: Not on file    Attends meetings of clubs or organizations: Not on file    Relationship status: Not on file  . Intimate partner violence:    Fear of current or ex partner: Not on file    Emotionally abused: Not on file  Physically abused: Not on file    Forced sexual activity: Not on file  Other Topics Concern  . Not on file  Social History Narrative   Pt lives at [facility] in the independent living section since 1994. Moved to [place] 12/08/2013. Moved to skill unit 11/29/14   Widowed   Has a living will, POA, DNR   Power scooter and rolling walker   Exercise none   Never smoked   Alcohol none                 Functional Status Survey:    Family History  Problem Relation Age of Onset  . Heart disease Father   . Colon cancer Sister   . Diabetes Brother   . Obesity Brother   . Mental retardation Daughter   . Obesity Daughter     Health Maintenance  Topic Date Due  . OPHTHALMOLOGY EXAM  12/16/2018 (Originally 07/22/2017)  . DEXA SCAN  07/18/2026 (Originally 10/31/1984)  . FOOT EXAM  06/06/2018  . HEMOGLOBIN A1C  06/17/2018  . TETANUS/TDAP  02/28/2023  . INFLUENZA VACCINE  Completed  . PNA vac Low Risk Adult  Completed    Allergies  Allergen Reactions  . Adhesive [Tape]     unknown  . Aspirin     unknown  . Augmentin [Amoxicillin-Pot Clavulanate]   . Codeine     unknown  . Diclofenac   . Enalapril Cough  . Hydrocodone     unknown  . Pantoprazole Sodium     unknown  . Voltaren [Diclofenac Sodium]     Outpatient Encounter Medications as of 02/26/2018  Medication Sig  . acetaminophen (TYLENOL) 500 MG tablet Take 1,000 mg by mouth 2 (two) times daily.   .  Fluticasone-Salmeterol (ADVAIR) 100-50 MCG/DOSE AEPB Inhale 1 puff into the lungs 2 (two) times daily.  Marland Kitchen guaiFENesin (MUCINEX) 600 MG 12 hr tablet Take 600 mg by mouth 2 (two) times daily.  . insulin glargine (LANTUS) 100 UNIT/ML injection Inject 26 Units into the skin at bedtime.   . insulin lispro (HUMALOG) 100 UNIT/ML injection Inject 8-12 Units into the skin daily. If BS is 150-250 give 8 units If BS is greater than 250 give 12 units  . insulin lispro (HUMALOG) 100 UNIT/ML injection Inject 12-15 Units into the skin 2 (two) times daily. Per sliding scale   Give 12 units if CBG between 150-250 Give 15 units if CGB > 250  . ipratropium-albuterol (DUONEB) 0.5-2.5 (3) MG/3ML SOLN Take 3 mLs by nebulization every 8 (eight) hours as needed.   Marland Kitchen ipratropium-albuterol (DUONEB) 0.5-2.5 (3) MG/3ML SOLN Take 3 mLs by nebulization 2 (two) times daily.  Marland Kitchen lidocaine (LIDODERM) 5 % Place 1 patch onto the skin daily. Apply to left neck for pain Remove & Discard patch within 12 hours or as directed by MD  . losartan (COZAAR) 25 MG tablet Take 1 tablet (25 mg total) by mouth daily.  . Metoprolol Tartrate 75 MG TABS Take 75 mg by mouth 2 (two) times daily. Hold med if SBP <110 OR HR < 60 b/min  . mineral oil-hydrophilic petrolatum (AQUAPHOR) ointment Apply 1 application daily as needed topically for dry skin (to the left upper arm).  . Multiple Vitamins-Minerals (THERAVIM-M PO) Take 1 tablet by mouth daily.  . OXYGEN Inhale 1.5 L/min into the lungs.   . saccharomyces boulardii (FLORASTOR) 250 MG capsule Take 250 mg by mouth 2 (two) times daily.  Marland Kitchen trolamine salicylate (ASPERCREME) 10 % cream Apply  1 application topically 3 (three) times daily. Apply to right knee  . vancomycin (VANCOCIN) 50 mg/mL oral solution Take 2.5 mLs (125 mg total) by mouth every 6 (six) hours for 7 days.  Marland Kitchen zinc oxide 20 % ointment Apply 1 application topically as needed for irritation. Apply to buttocks as needed  . predniSONE  (DELTASONE) 5 MG tablet Take 5 mg by mouth daily with breakfast.  . traZODone (DESYREL) 50 MG tablet Take 50 mg by mouth daily.    No facility-administered encounter medications on file as of 02/26/2018.     Review of Systems  Constitutional: Positive for activity change, appetite change and unexpected weight change.  HENT: Negative.   Respiratory: Positive for cough, shortness of breath and wheezing.   Cardiovascular: Positive for leg swelling. Negative for chest pain.  Gastrointestinal: Negative for abdominal pain and diarrhea.  Genitourinary: Negative.   Musculoskeletal: Negative.   Skin: Negative.   Neurological: Positive for weakness.  Psychiatric/Behavioral: Negative.   All other systems reviewed and are negative.   Vitals:   02/26/18 0911  BP: (!) 144/80  Pulse: 78  Resp: 19  Temp: 98.1 F (36.7 C)  Weight: 214 lb 6.4 oz (97.3 kg)  Height: 5\' 7"  (1.702 m)   Body mass index is 33.58 kg/m. Physical Exam Constitutional:      Appearance: Normal appearance.  HENT:     Head: Normocephalic.     Nose: Nose normal.     Mouth/Throat:     Mouth: Mucous membranes are moist.     Pharynx: Oropharynx is clear.  Eyes:     Pupils: Pupils are equal, round, and reactive to light.  Neck:     Musculoskeletal: Neck supple.  Cardiovascular:     Rate and Rhythm: Normal rate and regular rhythm.     Pulses: Normal pulses.     Heart sounds: Normal heart sounds.  Pulmonary:     Comments: Patient had Bilateral Expiratory Wheezing Abdominal:     General: Abdomen is flat. Bowel sounds are normal. There is no distension.     Palpations: Abdomen is soft.     Tenderness: There is no abdominal tenderness.  Musculoskeletal:     Comments: Mild Swelling with no Erythema btu Chronic Venous Changes  Skin:    General: Skin is warm and dry.  Neurological:     General: No focal deficit present.     Mental Status: She is alert and oriented to person, place, and time.  Psychiatric:         Mood and Affect: Mood normal.        Thought Content: Thought content normal.        Judgment: Judgment normal.     Labs reviewed: Basic Metabolic Panel: Recent Labs    02/19/18 0709 02/20/18 0525 02/21/18 0823  NA 146* 147* 143  K 4.6 4.3 4.3  CL 112* 110 108  CO2 25 27 28   GLUCOSE 161* 66* 90  BUN 48* 42* 34*  CREATININE 1.25* 1.11* 1.00  CALCIUM 8.7* 9.4 9.2   Liver Function Tests: Recent Labs    02/17/18 1419 02/18/18 0420 02/19/18 0709  AST 22 20 24   ALT 14 14 13   ALKPHOS 124 98 84  BILITOT 0.7 0.8 0.6  PROT 6.7 6.5 5.9*  ALBUMIN 3.5 3.3* 2.9*   No results for input(s): LIPASE, AMYLASE in the last 8760 hours. No results for input(s): AMMONIA in the last 8760 hours. CBC: Recent Labs    02/17/18 1419 02/18/18 0420  02/19/18 0709 02/20/18 0525  WBC 14.7* 12.3* 11.8* 11.4*  NEUTROABS 12.3*  --   --   --   HGB 10.6* 10.8* 9.9* 10.0*  HCT 34.3* 35.9* 33.2* 33.5*  MCV 103.9* 105.6* 107.1* 107.4*  PLT 241 220 221 209   Cardiac Enzymes: No results for input(s): CKTOTAL, CKMB, CKMBINDEX, TROPONINI in the last 8760 hours. BNP: Invalid input(s): POCBNP Lab Results  Component Value Date   HGBA1C 6.9 12/17/2017   Lab Results  Component Value Date   TSH 3.48 09/17/2017   No results found for: VITAMINB12 No results found for: FOLATE No results found for: IRON, TIBC, FERRITIN  Imaging and Procedures obtained prior to SNF admission: Ct Abdomen Pelvis Wo Contrast  Result Date: 02/17/2018 CLINICAL DATA:  Abdominal distention. Lower extremity swelling. Shortness of breath. Back pain. EXAM: CT ABDOMEN AND PELVIS WITHOUT CONTRAST TECHNIQUE: Multidetector CT imaging of the abdomen and pelvis was performed following the standard protocol without IV contrast. COMPARISON:  CT scan of the pelvis dated 09/13/2008 and CT scan of the chest dated 10/17/2009 FINDINGS: Lower chest: With slight atelectasis or scarring at the left lung base posteriorly. Aortic atherosclerosis.  Heart size is normal. No pericardial effusion. Tiny hiatal hernia. Hepatobiliary: 7 mm low-density lesion in the left lobe of the liver adjacent to the falciform ligament, minimally more prominent than on the prior study. Multiple stones in the nondistended gallbladder. The common bile duct measures 12 mm in diameter. There is dense material in the region of the distal common bile duct and ampulla which could represent stones in the distal common bile duct. Patient's bilirubin is not elevated. Pancreas: Diffuse atrophy of the pancreas. No pancreatic ductal dilatation. Spleen: Normal in size without focal abnormality. Adrenals/Urinary Tract: There is a 14 mm lesion which appears to be associated with the right renal artery. 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 Kidneys are normal.  Adrenal glands are normal.  Bladder is normal. Stomach/Bowel: Evidence of previous gastric bypass. There are a few diverticula in the colon. Small bowel and appendix appear normal. Vascular/Lymphatic: Extensive aortic atherosclerosis. No enlarged abdominal or pelvic lymph nodes. Reproductive: Calcified fibroids in the small uterus. 3.1 x 4.5 cm low-density lesion in the left ovary, increased from 2.2 cm on the prior study. Right ovary appears normal. Other: Tiny umbilical hernia containing only fat.  No ascites. Musculoskeletal: There is a mild benign-appearing compression fracture of the superior endplate of L2, age indeterminate. IMPRESSION: 1. Cholelithiasis with dilatation of the common bile duct with possible stones in the distal common bile duct. 2. 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 3. 4.5 cm low-density lesion in the left ovary, slightly increased in size since the prior CT scan of 2010. This is indeterminate. 4. Benign-appearing mild compression fracture of the superior endplate of L2, age indeterminate. 5.  Aortic Atherosclerosis (ICD10-I70.0). Electronically Signed   By: Lorriane Shire M.D.   On: 02/17/2018  16:58   Dg Chest Port 1 View  Result Date: 02/17/2018 CLINICAL DATA:  Shortness of breath, distended abdomen, lower extremity swelling, history bronchiectasis, gastric ulcer, COPD, coronary artery disease, hypertension, type II diabetes mellitus, former smoker EXAM: PORTABLE CHEST 1 VIEW COMPARISON:  Portable exam 1358 hours compared to 11/24/2014 FINDINGS: Borderline enlargement of cardiac silhouette. Mediastinal contours and pulmonary vascularity normal. Atherosclerotic calcification aorta. Minimal bronchitic changes and bibasilar atelectasis. Lungs otherwise clear. No definite pleural effusion or pneumothorax. Bones demineralized with advanced LEFT glenohumeral degenerative changes. IMPRESSION: Bronchitic changes with bibasilar atelectasis. Electronically  Signed   By: Lavonia Dana M.D.   On: 02/17/2018 14:50    Assessment/Plan  C. difficile colitis On Oral Vanco. Clinically she is better. Will continue to monitor  Chronic combined systolic and diastolic CHF Patient Lasix and Aldactone was stopped in the hospital due to dehydration But patient looks in CHF now. Will restart her Lasix and Aldactone Will follow renal function in 1 week  COPD Continue on Oxygen and Nebs Patient also on Chronic Prednisone  Controlled type 2 diabetes mellitus  BS are running more then 200 late evening. They are less then 150 in AM Last A1C was 6.9 Will Continue Lantus and Humalog Insulin with Meals   CKD (chronic kidney disease) stage 3, GFR 30-59 ml/min (HCC) Patient did have AKI in Hospital But her S Creat is back to baseline now Repeat Bmp on Lasix and Aldactone Patient also on Losartan  Weakness generalized Patient working with therapy  Anemia, chronic disease Repeat CBC     Family/ staff Communication:   Labs/tests ordered: Total time spent in this patient care encounter was _ minutes; greater than 50% of the visit spent counseling patient, reviewing records , Labs and coordinating  care for problems addressed at this encounter.

## 2018-03-06 DEATH — deceased

## 2020-01-08 IMAGING — DX DG CHEST 1V PORT
1 series · 1 of 1 positions shown · non-contrast
Comparison: Portable exam 8622 hours compared to 11/24/2014

CLINICAL DATA: Shortness of breath, distended abdomen, lower
extremity swelling, history bronchiectasis, gastric ulcer, COPD,
coronary artery disease, hypertension, type II diabetes mellitus,
former smoker

EXAM:
PORTABLE CHEST 1 VIEW

[chest ap]
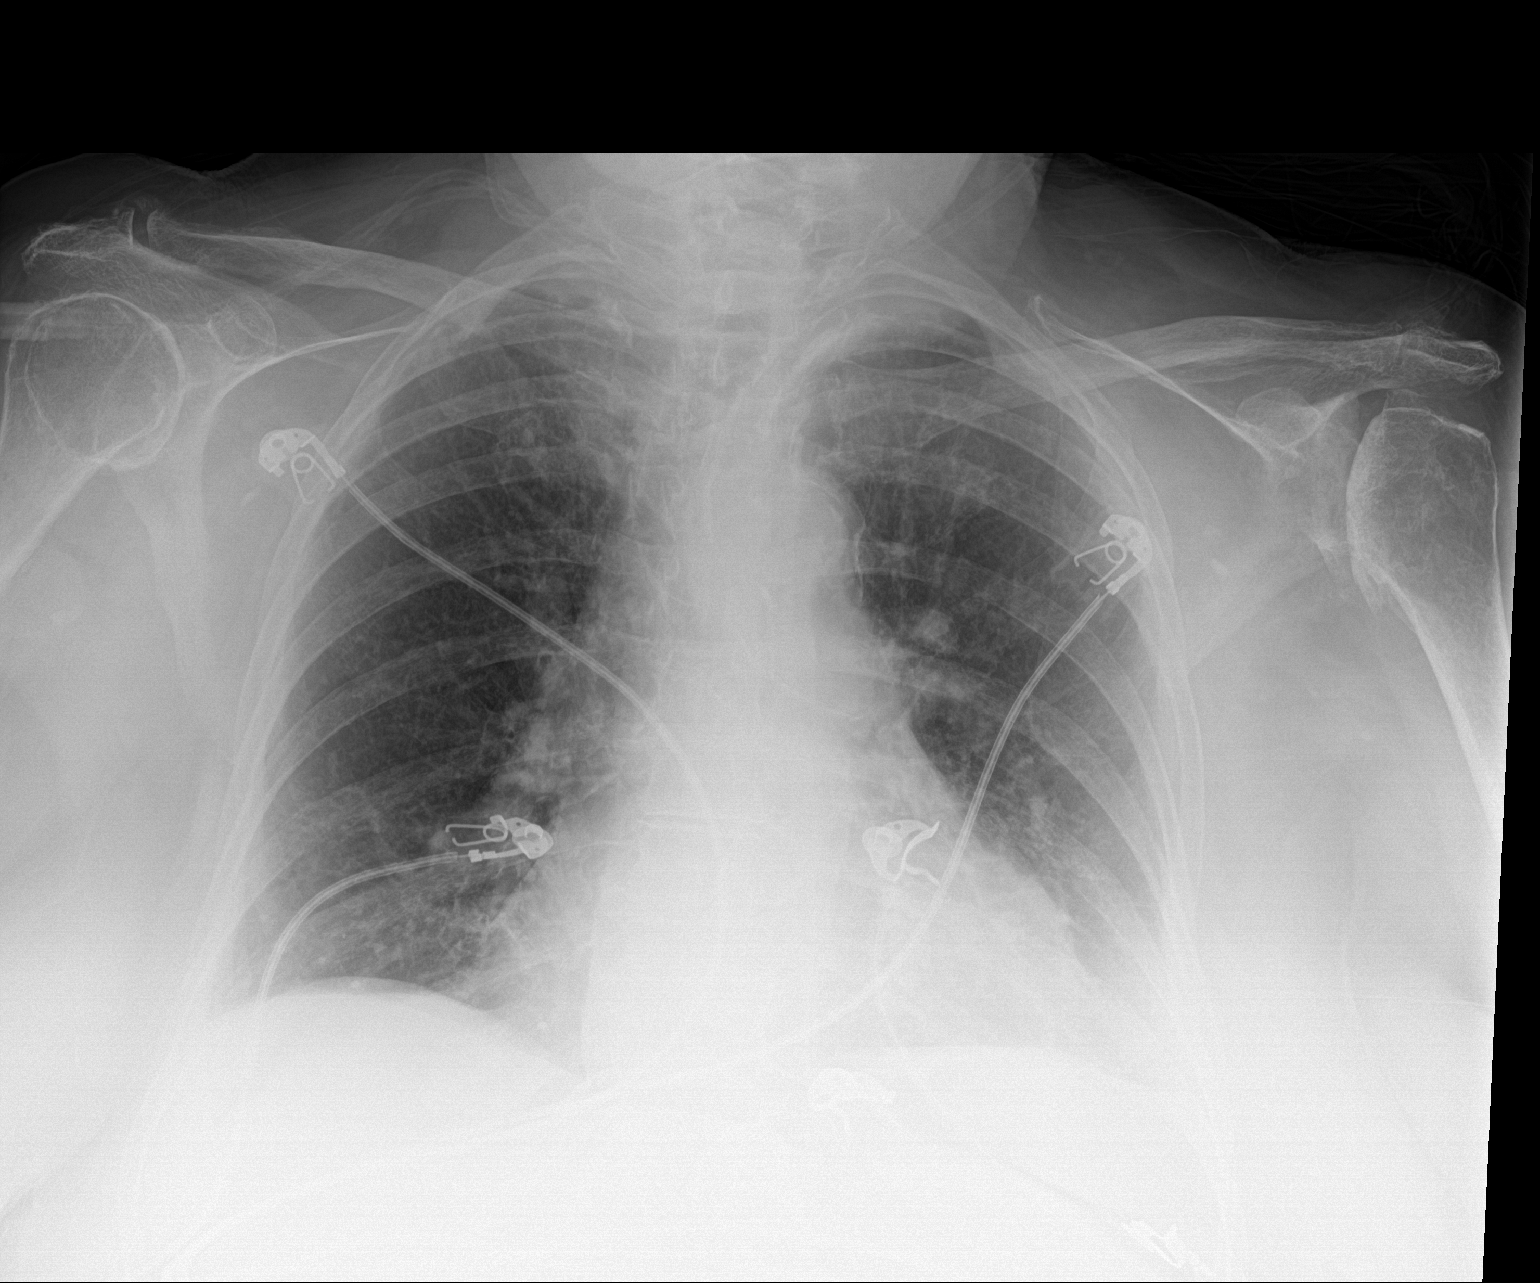

[1 of 1 positions shown; findings below may reference images not displayed]

FINDINGS: Borderline enlargement of cardiac silhouette.

Mediastinal contours and pulmonary vascularity normal.

Atherosclerotic calcification aorta.

Minimal bronchitic changes and bibasilar atelectasis.

Lungs otherwise clear.

No definite pleural effusion or pneumothorax.

Bones demineralized with advanced LEFT glenohumeral degenerative
changes.
IMPRESSION: Bronchitic changes with bibasilar atelectasis.

## 2020-01-10 IMAGING — DX DG CHEST 1V PORT
1 series · 1 of 1 positions shown · non-contrast
Comparison: 02/17/2018

CLINICAL DATA: Shortness of breath

EXAM:
PORTABLE CHEST 1 VIEW

[chest ap]
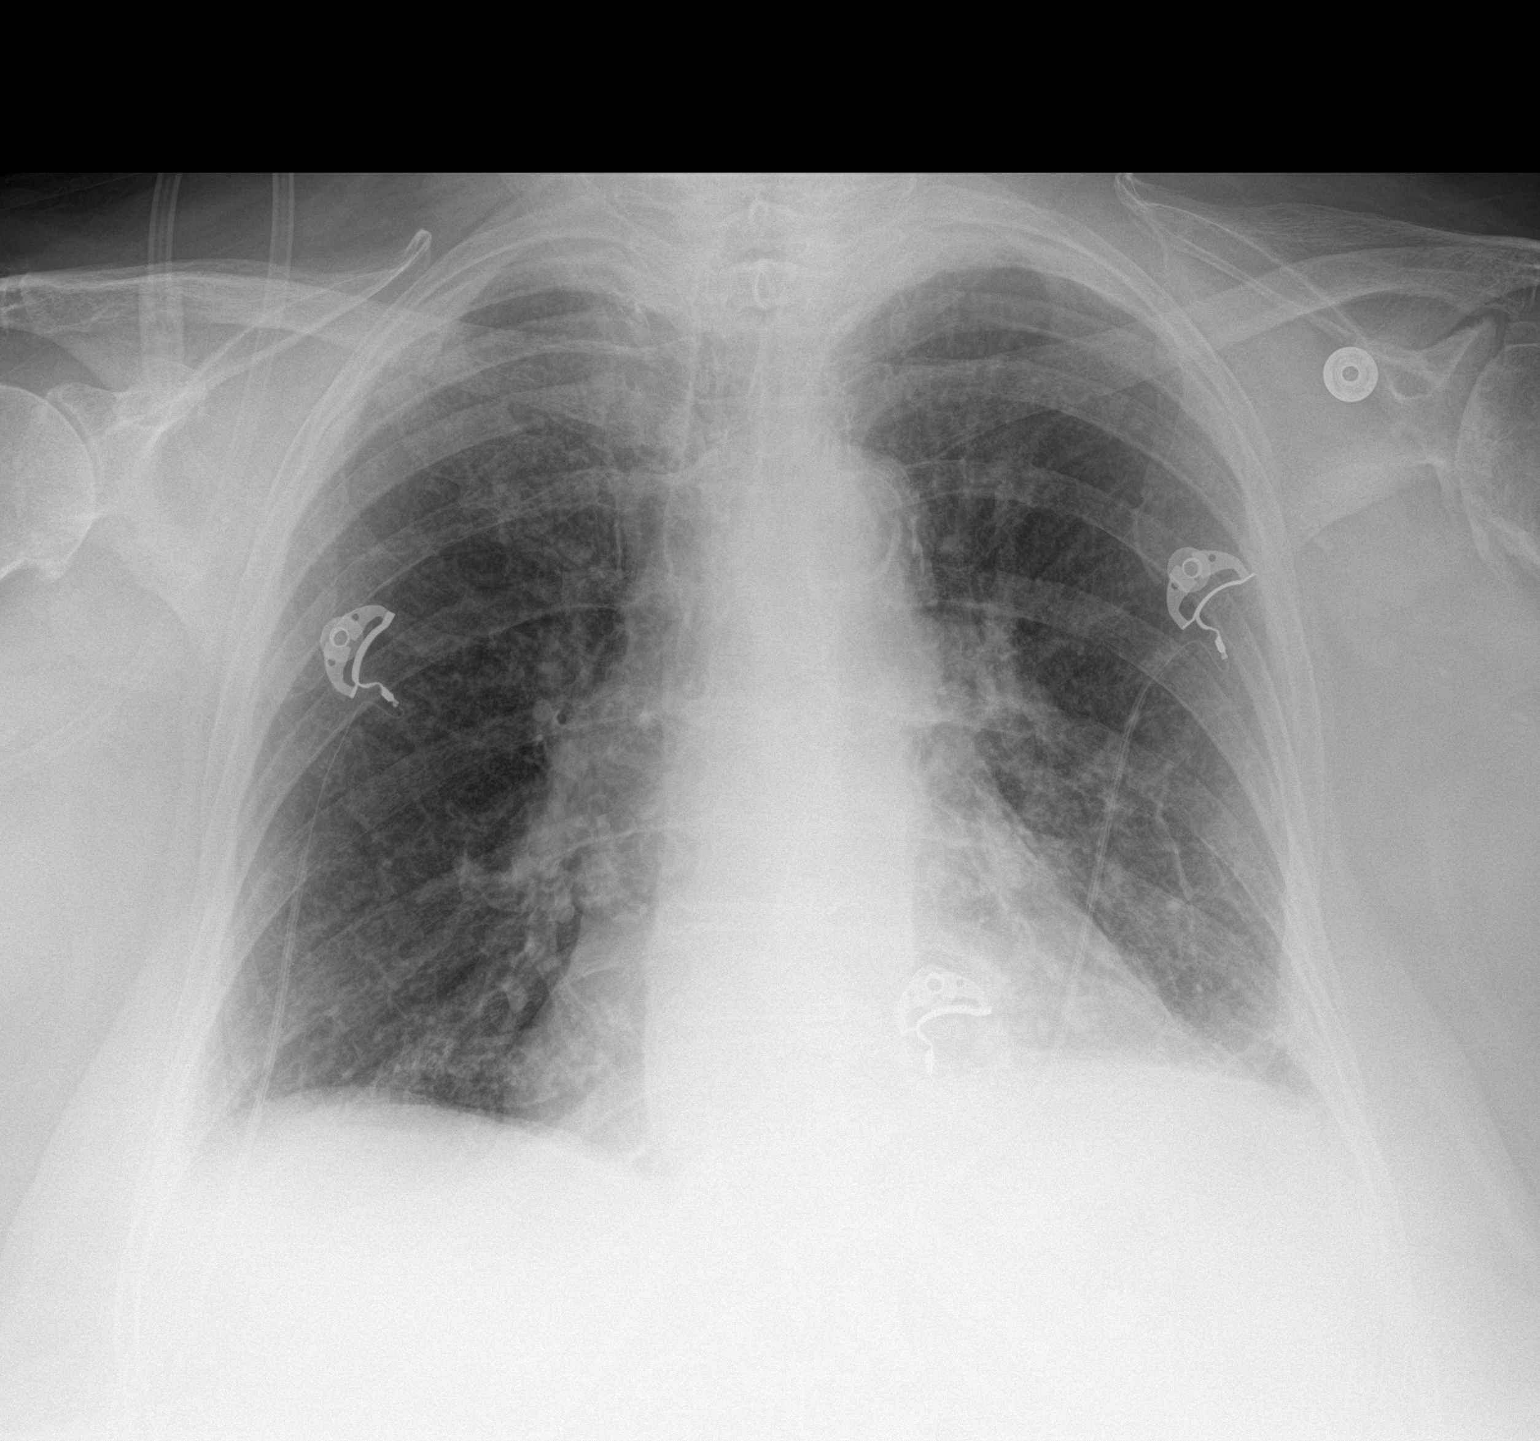

[1 of 1 positions shown; findings below may reference images not displayed]

FINDINGS: Mild hyperinflation/COPD. Heart is borderline in size. Mild
interstitial prominence without confluent opacity or effusion.
IMPRESSION: Mild hyperinflation, COPD.  No active disease.
